# Patient Record
Sex: Female | Born: 1960
Health system: Southern US, Community
[De-identification: ages and names within clinical notes are randomized; demographics above are authoritative.]

## PROBLEM LIST (undated history)

## (undated) DIAGNOSIS — N200 Calculus of kidney: Secondary | ICD-10-CM

## (undated) DIAGNOSIS — K5190 Ulcerative colitis, unspecified, without complications: Secondary | ICD-10-CM

## (undated) DIAGNOSIS — J45909 Unspecified asthma, uncomplicated: Secondary | ICD-10-CM

## (undated) DIAGNOSIS — M8580 Other specified disorders of bone density and structure, unspecified site: Secondary | ICD-10-CM

## (undated) DIAGNOSIS — Z8719 Personal history of other diseases of the digestive system: Secondary | ICD-10-CM

## (undated) DIAGNOSIS — K754 Autoimmune hepatitis: Secondary | ICD-10-CM

## (undated) DIAGNOSIS — I951 Orthostatic hypotension: Secondary | ICD-10-CM

## (undated) DIAGNOSIS — L509 Urticaria, unspecified: Secondary | ICD-10-CM

## (undated) DIAGNOSIS — G43909 Migraine, unspecified, not intractable, without status migrainosus: Secondary | ICD-10-CM

## (undated) DIAGNOSIS — J069 Acute upper respiratory infection, unspecified: Secondary | ICD-10-CM

## (undated) DIAGNOSIS — N189 Chronic kidney disease, unspecified: Secondary | ICD-10-CM

## (undated) HISTORY — DX: Acute upper respiratory infection, unspecified: J06.9

## (undated) HISTORY — PX: LITHOTRIPSY: SUR834

## (undated) HISTORY — DX: Autoimmune hepatitis: K75.4

## (undated) HISTORY — PX: THYROIDECTOMY: SHX17

## (undated) HISTORY — DX: Orthostatic hypotension: I95.1

## (undated) HISTORY — DX: Chronic kidney disease, unspecified: N18.9

## (undated) HISTORY — DX: Urticaria, unspecified: L50.9

## (undated) HISTORY — DX: Unspecified asthma, uncomplicated: J45.909

## (undated) HISTORY — PX: CHOLECYSTECTOMY: SHX55

## (undated) HISTORY — PX: COLONOSCOPY: SHX174

## (undated) HISTORY — DX: Other specified disorders of bone density and structure, unspecified site: M85.80

## (undated) HISTORY — DX: Personal history of other diseases of the digestive system: Z87.19

## (undated) HISTORY — DX: Migraine, unspecified, not intractable, without status migrainosus: G43.909

---

## 2011-11-21 ENCOUNTER — Ambulatory Visit: Payer: BC Managed Care – PPO | Attending: Family Medicine

## 2011-11-21 DIAGNOSIS — R5381 Other malaise: Secondary | ICD-10-CM | POA: Insufficient documentation

## 2011-11-21 DIAGNOSIS — M25619 Stiffness of unspecified shoulder, not elsewhere classified: Secondary | ICD-10-CM | POA: Insufficient documentation

## 2011-11-21 DIAGNOSIS — IMO0001 Reserved for inherently not codable concepts without codable children: Secondary | ICD-10-CM | POA: Insufficient documentation

## 2011-11-21 DIAGNOSIS — M25519 Pain in unspecified shoulder: Secondary | ICD-10-CM | POA: Insufficient documentation

## 2011-11-28 ENCOUNTER — Ambulatory Visit: Payer: BC Managed Care – PPO | Admitting: Physical Therapy

## 2011-11-30 ENCOUNTER — Encounter: Payer: BC Managed Care – PPO | Admitting: Physical Therapy

## 2011-12-05 ENCOUNTER — Ambulatory Visit: Payer: BC Managed Care – PPO | Admitting: Physical Therapy

## 2011-12-06 ENCOUNTER — Ambulatory Visit: Payer: BC Managed Care – PPO | Admitting: Physical Therapy

## 2011-12-11 ENCOUNTER — Ambulatory Visit: Payer: BC Managed Care – PPO

## 2012-01-22 ENCOUNTER — Ambulatory Visit: Payer: BC Managed Care – PPO | Admitting: Internal Medicine

## 2012-03-31 ENCOUNTER — Ambulatory Visit (HOSPITAL_BASED_OUTPATIENT_CLINIC_OR_DEPARTMENT_OTHER)
Admission: RE | Admit: 2012-03-31 | Discharge: 2012-03-31 | Disposition: A | Discharge status: Home / Self Care | Payer: BC Managed Care – PPO | Source: Ambulatory Visit | Attending: Internal Medicine | Admitting: Internal Medicine

## 2012-03-31 ENCOUNTER — Ambulatory Visit (INDEPENDENT_AMBULATORY_CARE_PROVIDER_SITE_OTHER): Payer: BC Managed Care – PPO | Admitting: Internal Medicine

## 2012-03-31 ENCOUNTER — Encounter: Payer: Self-pay | Admitting: Internal Medicine

## 2012-03-31 VITALS — BP 134/80 | HR 90 | Temp 98.2°F | Resp 18 | Ht 60.0 in | Wt 148.4 lb

## 2012-03-31 DIAGNOSIS — M899 Disorder of bone, unspecified: Secondary | ICD-10-CM

## 2012-03-31 DIAGNOSIS — J45909 Unspecified asthma, uncomplicated: Secondary | ICD-10-CM

## 2012-03-31 DIAGNOSIS — K5100 Ulcerative (chronic) pancolitis without complications: Secondary | ICD-10-CM | POA: Insufficient documentation

## 2012-03-31 DIAGNOSIS — I341 Nonrheumatic mitral (valve) prolapse: Secondary | ICD-10-CM | POA: Insufficient documentation

## 2012-03-31 DIAGNOSIS — K5190 Ulcerative colitis, unspecified, without complications: Secondary | ICD-10-CM | POA: Insufficient documentation

## 2012-03-31 DIAGNOSIS — Z8719 Personal history of other diseases of the digestive system: Secondary | ICD-10-CM

## 2012-03-31 DIAGNOSIS — Z1231 Encounter for screening mammogram for malignant neoplasm of breast: Secondary | ICD-10-CM | POA: Insufficient documentation

## 2012-03-31 DIAGNOSIS — Z8669 Personal history of other diseases of the nervous system and sense organs: Secondary | ICD-10-CM

## 2012-03-31 DIAGNOSIS — I951 Orthostatic hypotension: Secondary | ICD-10-CM | POA: Insufficient documentation

## 2012-03-31 DIAGNOSIS — Z780 Asymptomatic menopausal state: Secondary | ICD-10-CM

## 2012-03-31 DIAGNOSIS — Z87442 Personal history of urinary calculi: Secondary | ICD-10-CM

## 2012-03-31 DIAGNOSIS — Z139 Encounter for screening, unspecified: Secondary | ICD-10-CM

## 2012-03-31 DIAGNOSIS — G43909 Migraine, unspecified, not intractable, without status migrainosus: Secondary | ICD-10-CM | POA: Insufficient documentation

## 2012-03-31 DIAGNOSIS — M8580 Other specified disorders of bone density and structure, unspecified site: Secondary | ICD-10-CM | POA: Insufficient documentation

## 2012-03-31 DIAGNOSIS — Z862 Personal history of diseases of the blood and blood-forming organs and certain disorders involving the immune mechanism: Secondary | ICD-10-CM

## 2012-03-31 HISTORY — DX: Ulcerative colitis, unspecified, without complications: K51.90

## 2012-03-31 HISTORY — DX: Other specified disorders of bone density and structure, unspecified site: M85.80

## 2012-03-31 LAB — CBC WITH DIFFERENTIAL/PLATELET
Basophils Absolute: 0 10*3/uL (ref 0.0–0.1)
Basophils Relative: 1 % (ref 0–1)
Eosinophils Absolute: 0.2 10*3/uL (ref 0.0–0.7)
Eosinophils Relative: 3 % (ref 0–5)
MCH: 28.8 pg (ref 26.0–34.0)
MCV: 88.8 fL (ref 78.0–100.0)
Neutrophils Relative %: 56 % (ref 43–77)
Platelets: 326 10*3/uL (ref 150–400)
RBC: 4.27 MIL/uL (ref 3.87–5.11)
RDW: 13.8 % (ref 11.5–15.5)

## 2012-03-31 NOTE — Progress Notes (Signed)
Subjective:    Patient ID: Danielle Bond, female    DOB: 1960-12-18, 51 y.o.   MRN: [id number]  HPI New pt here for first visit to establish menopausal care.  Her primary MD is NP  Ouida Sills at a [facility] with Dr. Melford Aase.  She is going to keep her primary care.  See Problem list.   Danielle Bond is concerned over what she believes are menopausal symptoms.  She has been having night sweats and multiple hot flushes during day.  She also has vaginal dryness and some burning with intercourse.  LMP was 02/2011  She is S/P BTL.  Last pap and mammogram done 12/2010  FH  Paternal cousin with breast cancer, PAunt with ovarian cancer,  PGM MI  MGM CVA  On personal or FH of DVT or PE,  No personal history of cancer or MI.  Allergies  Allergen Reactions  . Aspirin Hives  . Penicillins Hives and Swelling  . Imitrex (Sumatriptan)   . Other     imetrex  . Peanuts (Peanut Oil)   . Pepcid (Famotidine) Nausea And Vomiting  . Prednisone Nausea And Vomiting   Past Medical History  Diagnosis Date  . Asthma   . Mitral valve prolapse   . Chronic kidney disease   . Migraine   . Orthostatic hypotension    Past Surgical History  Procedure Date  . Cesarean section   . Thyroidectomy   . Cholecystectomy    History   Social History  . Marital Status: Married    Spouse Name: N/A    Number of Children: N/A  . Years of Education: N/A   Occupational History  . Not on file.   Social History Main Topics  . Smoking status: Never Smoker   . Smokeless tobacco: Not on file  . Alcohol Use: No  . Drug Use: No  . Sexually Active: Yes   Other Topics Concern  . Not on file   Social History Narrative  . No narrative on file   Family History  Problem Relation Age of Onset  . Cancer Paternal Aunt   . Heart disease Paternal Grandmother    Patient Active Problem List  Diagnosis  . Mitral valve prolapse  . Migraine  . Orthostatic hypotension  . History of anemia  . Asthma  . Osteopenia  .  History of ulcerative colitis  . History of renal calculi  . History of migraine   Current Outpatient Prescriptions on File Prior to Visit  Medication Sig Dispense Refill  . levothyroxine (SYNTHROID, LEVOTHROID) 125 MCG tablet Take 125 mcg by mouth daily.          Review of Systems See HPI    Objective:   Physical Exam  Physical Exam  Nursing note and vitals reviewed.  Constitutional: She is oriented to person, place, and time. She appears well-developed and well-nourished.  HENT:  Head: Normocephalic and atraumatic.  Cardiovascular: Normal rate and regular rhythm. Exam reveals no gallop and no friction rub.  No murmur heard.  Pulmonary/Chest: Breath sounds normal. She has no wheezes. She has no rales.  Neurological: She is alert and oriented to person, place, and time.  Skin: Skin is warm and dry.  Psychiatric: She has a normal mood and affect. Her behavior is normal.             Assessment & Plan:  Vaginal dryness will give Luvena samples to try as she does not wish hormones at this time.  Menopause:  Clinically consistant  but will check [place] with chemistries today.  She reports recent normal TSH with primary NP  Vicenta Aly at [facility]  Will schedule mm today and schedule for pap and gyn exam.

## 2012-04-01 LAB — COMPREHENSIVE METABOLIC PANEL
ALT: 30 U/L (ref 0–35)
AST: 35 U/L (ref 0–37)
Alkaline Phosphatase: 82 U/L (ref 39–117)
CO2: 29 mEq/L (ref 19–32)
Creat: 0.67 mg/dL (ref 0.50–1.10)
Sodium: 139 mEq/L (ref 135–145)
Total Bilirubin: 0.4 mg/dL (ref 0.3–1.2)
Total Protein: 8.4 g/dL — ABNORMAL HIGH (ref 6.0–8.3)

## 2012-04-01 LAB — FOLLICLE STIMULATING HORMONE: FSH: 96.3 m[IU]/mL

## 2012-04-04 ENCOUNTER — Telehealth: Payer: Self-pay | Admitting: *Deleted

## 2012-04-09 NOTE — Telephone Encounter (Signed)
Results mailed to pt

## 2012-04-10 ENCOUNTER — Encounter: Payer: Self-pay | Admitting: Internal Medicine

## 2012-04-10 ENCOUNTER — Ambulatory Visit (INDEPENDENT_AMBULATORY_CARE_PROVIDER_SITE_OTHER): Payer: BC Managed Care – PPO | Admitting: Internal Medicine

## 2012-04-10 VITALS — BP 122/80 | HR 79 | Temp 98.2°F | Resp 18 | Wt 148.0 lb

## 2012-04-10 DIAGNOSIS — Z124 Encounter for screening for malignant neoplasm of cervix: Secondary | ICD-10-CM

## 2012-04-10 DIAGNOSIS — Z780 Asymptomatic menopausal state: Secondary | ICD-10-CM

## 2012-04-10 DIAGNOSIS — Z23 Encounter for immunization: Secondary | ICD-10-CM

## 2012-04-10 DIAGNOSIS — Z129 Encounter for screening for malignant neoplasm, site unspecified: Secondary | ICD-10-CM

## 2012-04-10 MED ORDER — ESCITALOPRAM OXALATE 5 MG PO TABS: 5.0000 mg | ORAL_TABLET | Freq: Every day | ORAL | Status: DC

## 2012-04-10 NOTE — Patient Instructions (Addendum)
See me in 3-4 months

## 2012-04-10 NOTE — Progress Notes (Signed)
  Subjective:    Patient ID: Danielle Bond, female    DOB: 1960-08-02, 51 y.o.   MRN: [id number]  HPI Danielle Bond is here for GYN exam  Her mammogram is normal..Marland KitchenShe is using [place] and liking that very well  She does not wish to take  HT.  Would like to try Non hormone treatment for hot flushes.  She does not want to take black cohosh.  Allergies  Allergen Reactions  . Aspirin Hives  . Penicillins Hives and Swelling  . Imitrex (Sumatriptan)   . Other     imetrex  . Peanuts (Peanut Oil)   . Pepcid (Famotidine) Nausea And Vomiting  . Prednisone Nausea And Vomiting   Past Medical History  Diagnosis Date  . Asthma   . Mitral valve prolapse   . Chronic kidney disease   . Migraine   . Orthostatic hypotension    Past Surgical History  Procedure Date  . Cesarean section   . Thyroidectomy   . Cholecystectomy    History   Social History  . Marital Status: Married    Spouse Name: N/A    Number of Children: N/A  . Years of Education: N/A   Occupational History  . Not on file.   Social History Main Topics  . Smoking status: Never Smoker   . Smokeless tobacco: Not on file  . Alcohol Use: No  . Drug Use: No  . Sexually Active: Yes   Other Topics Concern  . Not on file   Social History Narrative  . No narrative on file   Family History  Problem Relation Age of Onset  . Cancer Paternal Aunt   . Heart disease Paternal Grandmother    Patient Active Problem List  Diagnosis  . Mitral valve prolapse  . Migraine  . Orthostatic hypotension  . History of anemia  . Asthma  . Osteopenia  . History of ulcerative colitis  . History of renal calculi  . History of migraine   Current Outpatient Prescriptions on File Prior to Visit  Medication Sig Dispense Refill  . levothyroxine (SYNTHROID, LEVOTHROID) 125 MCG tablet Take 125 mcg by mouth daily.          Review of Systems    see HPI Objective:   Physical Exam Physical Exam  Nursing note and vitals reviewed.    Constitutional: She is oriented to person, place, and time. She appears well-developed and well-nourished.  HENT:  Head: Normocephalic and atraumatic.  Cardiovascular: Normal rate and regular rhythm. Exam reveals no gallop and no friction rub.  No murmur heard.  Pulmonary/Chest: Breath sounds normal. She has no wheezes. She has no rales. Pelvic  Normal external genitalia  Vagina no lesions.  Cervix no lesions pap obtained.  No adnexal masses or tenderness rectal no mass guaiac neg  Neurological: She is alert and oriented to person, place, and time.  Skin: Skin is warm and dry.  Psychiatric: She has a normal mood and affect. Her behavior is normal.             Assessment & Plan:  Menopause  Discussed all non hormone options and pt notified these are all off label.  She would like to try Lexapro.  Will start low dose 5 mg daily.  Continue Luvena  Influenza vaccine today

## 2012-04-17 ENCOUNTER — Telehealth: Payer: Self-pay | Admitting: Internal Medicine

## 2012-04-17 NOTE — Telephone Encounter (Signed)
Jolayne Haines  If this has not been done already  Send pap letter that pap is negative and repeat in one year.  Disregard if already done

## 2012-04-18 ENCOUNTER — Telehealth: Payer: Self-pay | Admitting: *Deleted

## 2012-04-18 NOTE — Telephone Encounter (Signed)
-   pap results mailed to pt

## 2012-04-21 ENCOUNTER — Telehealth: Payer: Self-pay | Admitting: *Deleted

## 2012-04-21 NOTE — Telephone Encounter (Signed)
Pt left message regarding new RX

## 2012-04-21 NOTE — Telephone Encounter (Signed)
Returned pt call concerning side affects of Lexapro. Pt states that she has had nausea and vomiting with this medication. Advised pt not to abruptly discontinue medication and to take with food. Pt states that she has already stopped medication and is having no ill effects as a result of this. Pt will schedule an appointment to review and discuss alternative medications.

## 2012-04-25 ENCOUNTER — Encounter: Payer: Self-pay | Admitting: *Deleted

## 2012-04-28 ENCOUNTER — Encounter: Payer: Self-pay | Admitting: *Deleted

## 2012-05-12 ENCOUNTER — Telehealth: Payer: Self-pay | Admitting: Internal Medicine

## 2012-05-12 NOTE — Telephone Encounter (Signed)
Pt has questions about her medication... She states she stop taking them.. They were not helping her.. Please call her [phone]... Ad

## 2012-05-13 NOTE — Telephone Encounter (Signed)
Returned pt call regarding her medications left message

## 2012-05-14 NOTE — Telephone Encounter (Signed)
Pt has chosen to stop RX hormones and will instead use herbal medications

## 2012-07-07 ENCOUNTER — Telehealth: Payer: Self-pay | Admitting: *Deleted

## 2012-07-07 NOTE — Telephone Encounter (Signed)
Pt called requesting a copy of F

## 2012-07-07 NOTE — Telephone Encounter (Signed)
Called to confirm that pt received fax

## 2012-08-02 ENCOUNTER — Emergency Department (HOSPITAL_COMMUNITY): Payer: BC Managed Care – PPO

## 2012-08-02 ENCOUNTER — Emergency Department (HOSPITAL_COMMUNITY)
Admission: EM | Admit: 2012-08-02 | Discharge: 2012-08-02 | Disposition: A | Discharge status: Home / Self Care | Payer: BC Managed Care – PPO | Attending: Emergency Medicine | Admitting: Emergency Medicine

## 2012-08-02 ENCOUNTER — Encounter (HOSPITAL_COMMUNITY): Payer: Self-pay | Admitting: Nurse Practitioner

## 2012-08-02 DIAGNOSIS — M79609 Pain in unspecified limb: Secondary | ICD-10-CM

## 2012-08-02 DIAGNOSIS — IMO0002 Reserved for concepts with insufficient information to code with codable children: Secondary | ICD-10-CM | POA: Insufficient documentation

## 2012-08-02 DIAGNOSIS — Z79899 Other long term (current) drug therapy: Secondary | ICD-10-CM | POA: Insufficient documentation

## 2012-08-02 DIAGNOSIS — M5412 Radiculopathy, cervical region: Secondary | ICD-10-CM

## 2012-08-02 DIAGNOSIS — J45909 Unspecified asthma, uncomplicated: Secondary | ICD-10-CM | POA: Insufficient documentation

## 2012-08-02 DIAGNOSIS — I951 Orthostatic hypotension: Secondary | ICD-10-CM | POA: Insufficient documentation

## 2012-08-02 DIAGNOSIS — G43909 Migraine, unspecified, not intractable, without status migrainosus: Secondary | ICD-10-CM | POA: Insufficient documentation

## 2012-08-02 DIAGNOSIS — N189 Chronic kidney disease, unspecified: Secondary | ICD-10-CM | POA: Insufficient documentation

## 2012-08-02 DIAGNOSIS — I059 Rheumatic mitral valve disease, unspecified: Secondary | ICD-10-CM | POA: Insufficient documentation

## 2012-08-02 MED ORDER — OXYCODONE-ACETAMINOPHEN 5-325 MG PO TABS: 2.0000 | ORAL_TABLET | ORAL | Status: DC | PRN

## 2012-08-02 MED ORDER — ONDANSETRON 4 MG PO TBDP
8.0000 mg | ORAL_TABLET | Freq: Once | ORAL | Status: AC
  Administered 2012-08-02: 8 mg via ORAL
  Filled 2012-08-02 (×2): qty 2

## 2012-08-02 MED ORDER — DIAZEPAM 5 MG PO TABS: 5.0000 mg | ORAL_TABLET | Freq: Two times a day (BID) | ORAL | Status: DC

## 2012-08-02 MED ORDER — HYDROMORPHONE HCL PF 1 MG/ML IJ SOLN
1.0000 mg | Freq: Once | INTRAMUSCULAR | Status: AC
  Administered 2012-08-02: 1 mg via INTRAMUSCULAR
  Filled 2012-08-02: qty 1

## 2012-08-02 MED ORDER — OXYCODONE-ACETAMINOPHEN 5-325 MG PO TABS
2.0000 | ORAL_TABLET | Freq: Once | ORAL | Status: DC
  Filled 2012-08-02: qty 2

## 2012-08-02 MED ORDER — ONDANSETRON 4 MG PO TBDP
4.0000 mg | ORAL_TABLET | Freq: Once | ORAL | Status: AC
  Administered 2012-08-02: 4 mg via ORAL
  Filled 2012-08-02: qty 1

## 2012-08-02 NOTE — ED Notes (Signed)
PT refused pain meds but reports pain higher than 10/10 . Pt reports she wants answers as to why she hurts so much. Pt reports [facility] sent her here for answers.

## 2012-08-02 NOTE — ED Provider Notes (Signed)
History    This chart was scribed for non-physician practitioner working with Babette Relic, MD by Rhae Lerner. This patient was seen in room TR09C/TR09C and the patient's care was started at 3:19 PM.    CSN: [id number]  Arrival date & time 08/02/12  1221      Chief Complaint  Patient presents with  . Arm Pain     The history is provided by the patient. No language interpreter was used.  Danielle Bond is a 52 y.o. female who presents to the Emergency Department complaining of constant, severe upper left arm pain onset 9 days ago. Rates pain at 10/10. Pt reports that there was gradual onset. She reports hx of similar pain 1 year ago in her right arm and was diagnosed with tendonitis (had rehab for treatment). Movement of left arm upward aggravates the pain. She went to [facility] and was seen by Vicenta Aly 5 days ago. Pt reports that she had moderate nausea, neck pain and it is hard to swallow. She states that symptoms gradually worsened. She went to Urgent Care and was not given pain medication due to being sent her for further evaluation. She states that she has had left leg pain. Pt took Advil (last dose 1 day ago) without relief. pmh asthma, mvp, kidney dz, migraines and orthostatic hypotension.   Past Medical History  Diagnosis Date  . Asthma   . Mitral valve prolapse   . Chronic kidney disease   . Migraine   . Orthostatic hypotension     Past Surgical History  Procedure Date  . Cesarean section   . Thyroidectomy   . Cholecystectomy     Family History  Problem Relation Age of Onset  . Cancer Paternal Aunt   . Heart disease Paternal Grandmother     History  Substance Use Topics  . Smoking status: Never Smoker   . Smokeless tobacco: Not on file  . Alcohol Use: No    OB History    Grav Para Term Preterm Abortions TAB SAB Ect Mult Living   2    2  2   2       Review of Systems  Constitutional: Negative for fever and chills.  HENT: Positive for neck pain.     Respiratory: Negative for shortness of breath.   Cardiovascular: Negative for chest pain, palpitations and leg swelling.  Gastrointestinal: Negative for nausea and vomiting.  Musculoskeletal: Negative for joint swelling.       L shoulder pain  Neurological: Negative for weakness.  All other systems reviewed and are negative.    Allergies  Aspirin; Penicillins; Imitrex; Other; Pepcid; and Prednisone  Home Medications   Current Outpatient Rx  Name  Route  Sig  Dispense  Refill  . LEVOTHYROXINE SODIUM 125 MCG PO TABS   Oral   Take 125 mcg by mouth daily.         Marland Kitchen MESALAMINE 1.2 G PO TBEC   Oral   Take 2,400 mg by mouth daily with breakfast.         . VERAPAMIL HCL 40 MG PO TABS   Oral   Take 40 mg by mouth daily.           BP 133/90  Pulse 100  Temp 98.2 F (36.8 C) (Oral)  Resp 16  SpO2 99%  Physical Exam  Nursing note and vitals reviewed. Constitutional: She is oriented to person, place, and time. She appears well-developed and well-nourished.  HENT:  Head: Normocephalic and  atraumatic.  Neck: Normal range of motion. Neck supple.  Cardiovascular: Normal rate, regular rhythm and normal heart sounds.   Pulmonary/Chest: Effort normal and breath sounds normal. No respiratory distress.  Musculoskeletal: Normal range of motion. She exhibits tenderness. She exhibits no edema.       Nl ROM of left arm. Pain with lifting left arm. Nl sensation in left arm.  L neck tenderness  Neurological: She is alert and oriented to person, place, and time.  Skin: Skin is warm and dry.  Psychiatric: She has a normal mood and affect. Her behavior is normal.    ED Course  Procedures (including critical care time) DIAGNOSTIC STUDIES: Oxygen Saturation is 99% on room air, normal by my interpretation.    COORDINATION OF CARE: 3:25 PM Discussed ED treatment with pt  3:25 PM Ordered:   Medications  mesalamine (LIALDA) 1.2 G EC tablet (not administered)  verapamil (CALAN)  40 MG tablet (not administered)  ondansetron (ZOFRAN-ODT) disintegrating tablet 8 mg (8 mg Oral Given 08/02/12 1536)  HYDROmorphone (DILAUDID) injection 1 mg (1 mg Intramuscular Given 08/02/12 1536)           Labs Reviewed - No data to display Dg Cervical Spine Complete  08/02/2012  *RADIOLOGY REPORT*  Clinical Data: Arm pain  CERVICAL SPINE - COMPLETE 4+ VIEW  Comparison: None  Findings: Normal alignment of the cervical spine.  The vertebral body heights and disc spaces are well preserved.  Facet joints are aligned.  No fracture or subluxation identified.  Surgical clips within the thyroid bed noted consistent with prior thyroidectomy.  IMPRESSION:  1.  Normal exam.   Original Report Authenticated By: Kerby Moors, M.D.      No diagnosis found.    MDM  Radicular pain to L neck LUE sent here from UC for eval.  U/S LUE with no blood clot. Cervical spine films unremarkable reviewed by myself.   Rx for pain meds and follow up with ortho.       I personally performed the services described in this documentation, which was scribed in my presence. The recorded information has been reviewed and is accurate.    Julieta Bellini, NP 08/04/12 1258

## 2012-08-02 NOTE — ED Notes (Addendum)
C/o L arm pain onset last Thursday, denies any injuries, but pain has been increasingly worse since onset. Went to [facility] for eval today and he sent for further eval of symptoms. Reports the [facility] told her he could feel swelling in her lymph nodes in her throat and armpit. L arm cms intact, but painful to move.

## 2012-08-02 NOTE — Progress Notes (Signed)
Orthopedic Tech Progress Note Patient Details:  Danielle Bond 1961/04/21 [id number]  Ortho Devices Type of Ortho Device: Arm sling Ortho Device/Splint Location: left UE Ortho Device/Splint Interventions: Application   Danielle Bond 08/02/2012, 6:28 PM

## 2012-08-02 NOTE — Progress Notes (Signed)
VASCULAR LAB PRELIMINARY  PRELIMINARY  PRELIMINARY  PRELIMINARY  Left upper extremity venous Doppler completed.    Preliminary report:  There is no obvious DVT or SVT noted in the left upper extremity.  Lanny Lipkin, RVT 08/02/2012, 4:45 PM

## 2012-08-08 NOTE — ED Provider Notes (Signed)
Medical screening examination/treatment/procedure(s) were performed by non-physician practitioner and as supervising physician I was immediately available for consultation/collaboration.   Babette Relic, MD 08/08/12 2159

## 2012-08-15 ENCOUNTER — Emergency Department (HOSPITAL_COMMUNITY): Payer: BC Managed Care – PPO

## 2012-08-15 ENCOUNTER — Encounter (HOSPITAL_COMMUNITY): Payer: Self-pay | Admitting: *Deleted

## 2012-08-15 ENCOUNTER — Inpatient Hospital Stay (HOSPITAL_COMMUNITY): Payer: BC Managed Care – PPO

## 2012-08-15 ENCOUNTER — Other Ambulatory Visit: Payer: Self-pay | Admitting: Gastroenterology

## 2012-08-15 ENCOUNTER — Observation Stay (HOSPITAL_COMMUNITY)
Admission: EM | Admit: 2012-08-15 | Discharge: 2012-08-16 | DRG: 205 | Disposition: A | Discharge status: Home / Self Care | Payer: BC Managed Care – PPO | Attending: Internal Medicine | Admitting: Internal Medicine

## 2012-08-15 DIAGNOSIS — E890 Postprocedural hypothyroidism: Secondary | ICD-10-CM | POA: Diagnosis present

## 2012-08-15 DIAGNOSIS — M19019 Primary osteoarthritis, unspecified shoulder: Secondary | ICD-10-CM | POA: Diagnosis present

## 2012-08-15 DIAGNOSIS — K5100 Ulcerative (chronic) pancolitis without complications: Secondary | ICD-10-CM | POA: Diagnosis present

## 2012-08-15 DIAGNOSIS — G43909 Migraine, unspecified, not intractable, without status migrainosus: Secondary | ICD-10-CM | POA: Diagnosis present

## 2012-08-15 DIAGNOSIS — J45909 Unspecified asthma, uncomplicated: Secondary | ICD-10-CM | POA: Diagnosis present

## 2012-08-15 DIAGNOSIS — Z881 Allergy status to other antibiotic agents status: Secondary | ICD-10-CM

## 2012-08-15 DIAGNOSIS — K754 Autoimmune hepatitis: Principal | ICD-10-CM | POA: Diagnosis present

## 2012-08-15 DIAGNOSIS — Z862 Personal history of diseases of the blood and blood-forming organs and certain disorders involving the immune mechanism: Secondary | ICD-10-CM

## 2012-08-15 DIAGNOSIS — Z79899 Other long term (current) drug therapy: Secondary | ICD-10-CM

## 2012-08-15 DIAGNOSIS — N189 Chronic kidney disease, unspecified: Secondary | ICD-10-CM | POA: Diagnosis present

## 2012-08-15 DIAGNOSIS — D649 Anemia, unspecified: Secondary | ICD-10-CM | POA: Diagnosis present

## 2012-08-15 DIAGNOSIS — Z888 Allergy status to other drugs, medicaments and biological substances status: Secondary | ICD-10-CM

## 2012-08-15 DIAGNOSIS — Z9089 Acquired absence of other organs: Secondary | ICD-10-CM

## 2012-08-15 DIAGNOSIS — K759 Inflammatory liver disease, unspecified: Secondary | ICD-10-CM

## 2012-08-15 DIAGNOSIS — K5190 Ulcerative colitis, unspecified, without complications: Secondary | ICD-10-CM | POA: Diagnosis present

## 2012-08-15 DIAGNOSIS — R7401 Elevation of levels of liver transaminase levels: Secondary | ICD-10-CM | POA: Diagnosis present

## 2012-08-15 DIAGNOSIS — E876 Hypokalemia: Secondary | ICD-10-CM | POA: Diagnosis present

## 2012-08-15 DIAGNOSIS — I059 Rheumatic mitral valve disease, unspecified: Secondary | ICD-10-CM | POA: Diagnosis present

## 2012-08-15 DIAGNOSIS — I341 Nonrheumatic mitral (valve) prolapse: Secondary | ICD-10-CM | POA: Diagnosis present

## 2012-08-15 DIAGNOSIS — Z886 Allergy status to analgesic agent status: Secondary | ICD-10-CM

## 2012-08-15 DIAGNOSIS — Z87442 Personal history of urinary calculi: Secondary | ICD-10-CM

## 2012-08-15 DIAGNOSIS — M25519 Pain in unspecified shoulder: Secondary | ICD-10-CM | POA: Diagnosis present

## 2012-08-15 DIAGNOSIS — K5900 Constipation, unspecified: Secondary | ICD-10-CM | POA: Diagnosis present

## 2012-08-15 HISTORY — DX: Ulcerative colitis, unspecified, without complications: K51.90

## 2012-08-15 LAB — COMPREHENSIVE METABOLIC PANEL
ALT: 812 U/L — ABNORMAL HIGH (ref 0–35)
AST: 476 U/L — ABNORMAL HIGH (ref 0–37)
Albumin: 3.4 g/dL — ABNORMAL LOW (ref 3.5–5.2)
Alkaline Phosphatase: 205 U/L — ABNORMAL HIGH (ref 39–117)
BUN: 19 mg/dL (ref 6–23)
CO2: 27 mEq/L (ref 19–32)
Calcium: 8.9 mg/dL (ref 8.4–10.5)
Chloride: 100 mEq/L (ref 96–112)
Creatinine, Ser: 0.65 mg/dL (ref 0.50–1.10)
GFR calc Af Amer: >90 mL/min (ref >90)
GFR calc non Af Amer: >90 mL/min (ref >90)
Glucose, Bld: 123 mg/dL — ABNORMAL HIGH (ref 70–99)
Potassium: 3 mEq/L — ABNORMAL LOW (ref 3.5–5.1)
Sodium: 137 mEq/L (ref 135–145)
Total Bilirubin: 0.9 mg/dL (ref 0.3–1.2)
Total Protein: 9.3 g/dL — ABNORMAL HIGH (ref 6.0–8.3)

## 2012-08-15 LAB — CBC WITH DIFFERENTIAL/PLATELET
Basophils Absolute: 0 10*3/uL (ref 0.0–0.1)
Basophils Relative: 0 % (ref 0–1)
Eosinophils Absolute: 0 10*3/uL (ref 0.0–0.7)
Eosinophils Relative: 0 % (ref 0–5)
HCT: 40.2 % (ref 36.0–46.0)
Hemoglobin: 13.7 g/dL (ref 12.0–15.0)
Lymphocytes Relative: 23 % (ref 12–46)
Lymphs Abs: 1.7 10*3/uL (ref 0.7–4.0)
MCH: 29.8 pg (ref 26.0–34.0)
MCHC: 34.1 g/dL (ref 30.0–36.0)
MCV: 87.6 fL (ref 78.0–100.0)
Monocytes Absolute: 0.2 10*3/uL (ref 0.1–1.0)
Monocytes Relative: 3 % (ref 3–12)
Neutro Abs: 5.5 10*3/uL (ref 1.7–7.7)
Neutrophils Relative %: 74 % (ref 43–77)
Platelets: 316 10*3/uL (ref 150–400)
RBC: 4.59 MIL/uL (ref 3.87–5.11)
RDW: 14.3 % (ref 11.5–15.5)
WBC: 7.5 10*3/uL (ref 4.0–10.5)

## 2012-08-15 LAB — URINALYSIS, ROUTINE W REFLEX MICROSCOPIC
Glucose, UA: NEGATIVE mg/dL
Hgb urine dipstick: NEGATIVE
Ketones, ur: 15 mg/dL — AB
Nitrite: NEGATIVE
Protein, ur: NEGATIVE mg/dL
Specific Gravity, Urine: 1.033 — ABNORMAL HIGH (ref 1.005–1.030)
Urobilinogen, UA: 1 mg/dL (ref 0.0–1.0)
pH: 5 (ref 5.0–8.0)

## 2012-08-15 LAB — HEPATIC FUNCTION PANEL
AST: 463 U/L — ABNORMAL HIGH (ref 0–37)
Bilirubin, Direct: 1 mg/dL — ABNORMAL HIGH (ref 0.0–0.3)
Indirect Bilirubin: 0.5 mg/dL (ref 0.3–0.9)
Total Bilirubin: 1.5 mg/dL — ABNORMAL HIGH (ref 0.3–1.2)

## 2012-08-15 LAB — URINE MICROSCOPIC-ADD ON

## 2012-08-15 LAB — CBC
HCT: 36.2 % (ref 36.0–46.0)
Hemoglobin: 12.1 g/dL (ref 12.0–15.0)
MCH: 29.4 pg (ref 26.0–34.0)
MCHC: 33.4 g/dL (ref 30.0–36.0)
MCV: 88.1 fL (ref 78.0–100.0)
Platelets: 308 K/uL (ref 150–400)
RBC: 4.11 MIL/uL (ref 3.87–5.11)
RDW: 14.3 % (ref 11.5–15.5)
WBC: 7.3 K/uL (ref 4.0–10.5)

## 2012-08-15 LAB — AMMONIA: Ammonia: 15 umol/L (ref 11–60)

## 2012-08-15 LAB — ACETAMINOPHEN LEVEL: Acetaminophen (Tylenol), Serum: <15 ug/mL (ref 10–30)

## 2012-08-15 LAB — LIPASE, BLOOD: Lipase: 66 U/L — ABNORMAL HIGH (ref 11–59)

## 2012-08-15 MED ORDER — HYDROMORPHONE HCL PF 1 MG/ML IJ SOLN
0.5000 mg | INTRAMUSCULAR | Status: DC | PRN
  Administered 2012-08-15: 0.5 mg via INTRAVENOUS
  Filled 2012-08-15: qty 1

## 2012-08-15 MED ORDER — ONDANSETRON HCL 4 MG/2ML IJ SOLN: 4.0000 mg | Freq: Once | INTRAMUSCULAR | Status: DC

## 2012-08-15 MED ORDER — SODIUM CHLORIDE 0.9 % IV BOLUS (SEPSIS)
1000.0000 mL | Freq: Once | INTRAVENOUS | Status: AC
  Administered 2012-08-15: 1000 mL via INTRAVENOUS

## 2012-08-15 MED ORDER — ONDANSETRON HCL 4 MG/2ML IJ SOLN
4.0000 mg | Freq: Once | INTRAMUSCULAR | Status: AC
  Administered 2012-08-15: 4 mg via INTRAVENOUS
  Filled 2012-08-15: qty 2

## 2012-08-15 MED ORDER — PANTOPRAZOLE SODIUM 40 MG PO TBEC
40.0000 mg | DELAYED_RELEASE_TABLET | Freq: Every day | ORAL | Status: DC
  Administered 2012-08-15 – 2012-08-16 (×2): 40 mg via ORAL
  Filled 2012-08-15 (×2): qty 1

## 2012-08-15 MED ORDER — HYDROMORPHONE HCL PF 1 MG/ML IJ SOLN
1.0000 mg | Freq: Once | INTRAMUSCULAR | Status: AC
  Administered 2012-08-15: 1 mg via INTRAVENOUS
  Filled 2012-08-15: qty 1

## 2012-08-15 MED ORDER — ONDANSETRON HCL 4 MG/2ML IJ SOLN
INTRAMUSCULAR | Status: AC
  Filled 2012-08-15: qty 2

## 2012-08-15 MED ORDER — PREDNISONE 20 MG PO TABS: 60.0000 mg | ORAL_TABLET | Freq: Every day | ORAL | Status: DC

## 2012-08-15 MED ORDER — HYDROMORPHONE HCL PF 1 MG/ML IJ SOLN: 1.0000 mg | Freq: Once | INTRAMUSCULAR | Status: DC

## 2012-08-15 MED ORDER — MESALAMINE 1.2 G PO TBEC
2400.0000 mg | DELAYED_RELEASE_TABLET | Freq: Every day | ORAL | Status: DC
  Administered 2012-08-15: 2.4 g via ORAL
  Filled 2012-08-15 (×2): qty 2

## 2012-08-15 MED ORDER — ONDANSETRON HCL 4 MG PO TABS: 4.0000 mg | ORAL_TABLET | Freq: Four times a day (QID) | ORAL | Status: DC | PRN

## 2012-08-15 MED ORDER — OXYCODONE HCL 5 MG PO TABS: 5.0000 mg | ORAL_TABLET | ORAL | Status: DC | PRN

## 2012-08-15 MED ORDER — PROMETHAZINE HCL 25 MG/ML IJ SOLN
12.5000 mg | Freq: Once | INTRAMUSCULAR | Status: AC
  Administered 2012-08-15: 12.5 mg via INTRAVENOUS
  Filled 2012-08-15: qty 1

## 2012-08-15 MED ORDER — LEVALBUTEROL HCL 0.63 MG/3ML IN NEBU
0.6300 mg | INHALATION_SOLUTION | Freq: Four times a day (QID) | RESPIRATORY_TRACT | Status: DC | PRN
  Filled 2012-08-15: qty 3

## 2012-08-15 MED ORDER — ONDANSETRON HCL 4 MG/2ML IJ SOLN
4.0000 mg | Freq: Once | INTRAMUSCULAR | Status: AC
  Administered 2012-08-15: 4 mg via INTRAVENOUS

## 2012-08-15 MED ORDER — PREDNISONE 50 MG PO TABS
60.0000 mg | ORAL_TABLET | Freq: Every day | ORAL | Status: DC
  Administered 2012-08-15 – 2012-08-16 (×2): 60 mg via ORAL
  Filled 2012-08-15 (×3): qty 1

## 2012-08-15 MED ORDER — ENOXAPARIN SODIUM 40 MG/0.4ML ~~LOC~~ SOLN
40.0000 mg | SUBCUTANEOUS | Status: DC
  Administered 2012-08-15: 40 mg via SUBCUTANEOUS
  Filled 2012-08-15 (×2): qty 0.4

## 2012-08-15 MED ORDER — IOHEXOL 300 MG/ML  SOLN
100.0000 mL | Freq: Once | INTRAMUSCULAR | Status: AC | PRN
  Administered 2012-08-15: 100 mL via INTRAVENOUS

## 2012-08-15 MED ORDER — POTASSIUM CHLORIDE IN NACL 40-0.9 MEQ/L-% IV SOLN
INTRAVENOUS | Status: DC
  Administered 2012-08-15 – 2012-08-16 (×2): via INTRAVENOUS
  Filled 2012-08-15 (×3): qty 1000

## 2012-08-15 MED ORDER — ONDANSETRON HCL 4 MG/2ML IJ SOLN
4.0000 mg | Freq: Four times a day (QID) | INTRAMUSCULAR | Status: DC | PRN
  Administered 2012-08-15 – 2012-08-16 (×2): 4 mg via INTRAVENOUS
  Filled 2012-08-15 (×2): qty 2

## 2012-08-15 MED ORDER — IOHEXOL 300 MG/ML  SOLN
50.0000 mL | Freq: Once | INTRAMUSCULAR | Status: AC | PRN
  Administered 2012-08-15: 50 mL via ORAL

## 2012-08-15 MED ORDER — LEVOTHYROXINE SODIUM 125 MCG PO TABS
125.0000 ug | ORAL_TABLET | Freq: Every day | ORAL | Status: DC
  Administered 2012-08-16: 125 ug via ORAL
  Filled 2012-08-15 (×3): qty 1

## 2012-08-15 MED ORDER — VERAPAMIL HCL 40 MG PO TABS
40.0000 mg | ORAL_TABLET | Freq: Every evening | ORAL | Status: DC
Start: 2012-08-15 — End: 2012-08-16
  Filled 2012-08-15 (×3): qty 1

## 2012-08-15 NOTE — Consult Note (Signed)
Reason for Consult: Elevated liver enzymes. Referring Physician: [facility]  [name] HPI: This is a 52 year old female with complaints of abdominal bloating, pain, and left arm arthritis.  She started to have these issues two weeks ago.  Initially it was in the form of left arm pain and swelling.  Her left 5th digit was also swollen.  She presented to Urgent Care and she was thought to have a cervical radiculopathy, but further evaluation with Dr. Sharol Given, Orthopedics, did not confirm this diagnosis.  She was sent over to Dr. Estanislado Pandy for her complaints, which were consistent with fibromyalgia.  Dr. Estanislado Pandy evaluated the patient this past Tuesday and she her lower extremities were consistent with fibromyalgia, but her upper extremity pain was possibly autoimmune.  Blood work drawn at that time ultimately revealed an ALT of 1200 and an AST of 800.  Her AP was also elevated at 250, but no increase in her bilirubin.  Her ANA was also increased at 1:640.  Prior to the blood work results she was placed on a steroid taper and she felt much better until she tapered down to a certain level.  As a result of her persistent symptoms she presented to the ER.  She has a history of UC diagnosed 3 years ago and at that time she reports having similar enzyme elevations, but no physician, even her gastroenterologist, per her report, did not mention anything about autoimmune hepatitis.  With treatment for her UC her liver enzymes ultimately normalized.  She moved down to [place] last year and she established caer with Dr. Collene Mares.  Her UC symptoms have been under control with the use of Lialda.  A CT scan performed today revealed a mild periportal edema, but no other abnormalities.    Past Medical History  Diagnosis Date  . Asthma   . Mitral valve prolapse   . Chronic kidney disease   . Migraine   . Orthostatic hypotension     Past Surgical History  Procedure Date  . Cesarean section   . Thyroidectomy    . Cholecystectomy     Family History  Problem Relation Age of Onset  . Cancer Paternal Aunt   . Heart disease Paternal Grandmother     Social History:  reports that she has never smoked. She does not have any smokeless tobacco history on file. She reports that she does not drink alcohol or use illicit drugs.  Allergies:  Allergies  Allergen Reactions  . Other Anaphylaxis    Almonds, tree nuts  . Aspirin Hives and Nausea Only  . Penicillins Hives and Swelling  . Imitrex (Sumatriptan) Swelling    Injection caused swelling  . Pepcid (Famotidine) Nausea And Vomiting  . Prednisone Nausea And Vomiting    All steroids causes nausea and pancreatitis     Medications:  Scheduled:   . pantoprazole  40 mg Oral Daily  . predniSONE  60 mg Oral Q breakfast   Continuous:   Results for orders placed during the hospital encounter of 08/15/12 (from the past 24 hour(s))  CBC WITH DIFFERENTIAL     Status: Normal   Collection Time   08/15/12 12:43 PM      Component Value Range   WBC 7.5  4.0 - 10.5 K/uL   RBC 4.59  3.87 - 5.11 MIL/uL   Hemoglobin 13.7  12.0 - 15.0 g/dL   HCT 40.2  36.0 - 46.0 %   MCV 87.6  78.0 - 100.0 fL   MCH  29.8  26.0 - 34.0 pg   MCHC 34.1  30.0 - 36.0 g/dL   RDW 14.3  11.5 - 15.5 %   Platelets 316  150 - 400 K/uL   Neutrophils Relative 74  43 - 77 %   Neutro Abs 5.5  1.7 - 7.7 K/uL   Lymphocytes Relative 23  12 - 46 %   Lymphs Abs 1.7  0.7 - 4.0 K/uL   Monocytes Relative 3  3 - 12 %   Monocytes Absolute 0.2  0.1 - 1.0 K/uL   Eosinophils Relative 0  0 - 5 %   Eosinophils Absolute 0.0  0.0 - 0.7 K/uL   Basophils Relative 0  0 - 1 %   Basophils Absolute 0.0  0.0 - 0.1 K/uL  COMPREHENSIVE METABOLIC PANEL     Status: Abnormal   Collection Time   08/15/12 12:43 PM      Component Value Range   Sodium 137  135 - 145 mEq/L   Potassium 3.0 (*) 3.5 - 5.1 mEq/L   Chloride 100  96 - 112 mEq/L   CO2 27  19 - 32 mEq/L   Glucose, Bld 123 (*) 70 - 99 mg/dL   BUN 19  6  - 23 mg/dL   Creatinine, Ser 0.65  0.50 - 1.10 mg/dL   Calcium 8.9  8.4 - 10.5 mg/dL   Total Protein 9.3 (*) 6.0 - 8.3 g/dL   Albumin 3.4 (*) 3.5 - 5.2 g/dL   AST 476 (*) 0 - 37 U/L   ALT 812 (*) 0 - 35 U/L   Alkaline Phosphatase 205 (*) 39 - 117 U/L   Total Bilirubin 0.9  0.3 - 1.2 mg/dL   GFR calc non Af Amer >90  >90 mL/min   GFR calc Af Amer >90  >90 mL/min  LIPASE, BLOOD     Status: Abnormal   Collection Time   08/15/12 12:43 PM      Component Value Range   Lipase 66 (*) 11 - 59 U/L  TROPONIN I     Status: Normal   Collection Time   08/15/12 12:55 PM      Component Value Range   Troponin I <0.30  <0.30 ng/mL  URINALYSIS, ROUTINE W REFLEX MICROSCOPIC     Status: Abnormal   Collection Time   08/15/12  2:10 PM      Component Value Range   Color, Urine ORANGE (*) YELLOW   APPearance CLOUDY (*) CLEAR   Specific Gravity, Urine 1.033 (*) 1.005 - 1.030   pH 5.0  5.0 - 8.0   Glucose, UA NEGATIVE  NEGATIVE mg/dL   Hgb urine dipstick NEGATIVE  NEGATIVE   Bilirubin Urine MODERATE (*) NEGATIVE   Ketones, ur 15 (*) NEGATIVE mg/dL   Protein, ur NEGATIVE  NEGATIVE mg/dL   Urobilinogen, UA 1.0  0.0 - 1.0 mg/dL   Nitrite NEGATIVE  NEGATIVE   Leukocytes, UA TRACE (*) NEGATIVE  URINE MICROSCOPIC-ADD ON     Status: Abnormal   Collection Time   08/15/12  2:10 PM      Component Value Range   Squamous Epithelial / LPF RARE  RARE   WBC, UA 0-2  <3 WBC/hpf   RBC / HPF 0-2  <3 RBC/hpf   Crystals CA OXALATE CRYSTALS (*) NEGATIVE   Urine-Other MUCOUS PRESENT    ACETAMINOPHEN LEVEL     Status: Normal   Collection Time   08/15/12  4:30 PM      Component Value  Range   Acetaminophen (Tylenol), Serum <15.0  10 - 30 ug/mL  HEPATIC FUNCTION PANEL     Status: Abnormal   Collection Time   08/15/12  4:30 PM      Component Value Range   Total Protein 8.2  6.0 - 8.3 g/dL   Albumin 3.2 (*) 3.5 - 5.2 g/dL   AST 463 (*) 0 - 37 U/L   ALT 760 (*) 0 - 35 U/L   Alkaline Phosphatase 207 (*) 39 - 117 U/L    Total Bilirubin 1.5 (*) 0.3 - 1.2 mg/dL   Bilirubin, Direct 1.0 (*) 0.0 - 0.3 mg/dL   Indirect Bilirubin 0.5  0.3 - 0.9 mg/dL  AMMONIA     Status: Normal   Collection Time   08/15/12  4:31 PM      Component Value Range   Ammonia 15  11 - 60 umol/L     Ct Abdomen Pelvis W Contrast  08/15/2012  *RADIOLOGY REPORT*  Clinical Data: Abnormal LFTs.  Abdominal pain, nausea.  CT ABDOMEN AND PELVIS WITH CONTRAST  Technique:  Multidetector CT imaging of the abdomen and pelvis was performed following the standard protocol during bolus administration of intravenous contrast.  Contrast: 167m OMNIPAQUE IOHEXOL 300 MG/ML  SOLN  Comparison: None.  Findings: Lung bases are clear.  No effusions.  Heart is normal size.  Prior cholecystectomy.  Slight intrahepatic biliary ductal dilatation.  There is also likely mild periportal edema.  This is nonspecific but can be seen with volume resuscitation or inflammatory processes such as hepatitis.  No focal hepatic abnormality.  Stomach, pancreas, spleen, adrenals, kidneys are unremarkable. Appendix is visualized and is normal.  Moderate stool throughout the colon.  Small bowel is decompressed.  No free fluid, free air or adenopathy.  Urinary bladder is decompressed.  No acute bony abnormality.  IMPRESSION: Mild periportal edema within the liver, nonspecific but may be related to volume resuscitation or a nonspecific inflammatory process such as hepatitis.  Moderate stool burden throughout the colon.  Normal appendix.   Original Report Authenticated By: KRolm Baptise M.D.     ROS:  As stated above in the HPI otherwise negative.  Blood pressure 106/74, pulse 80, temperature 98.3 F (36.8 C), temperature source Oral, resp. rate 21, SpO2 100.00%.    PE: Gen: NAD, Alert and Oriented HEENT:  [place]/AT, EOMI Neck: Supple, no LAD Lungs: CTA Bilaterally CV: RRR without M/G/R ABM: Soft, tender in the mid abdomen, +BS Ext: No C/C/E  Assessment/Plan: 1) Autoimmune Hepatitis. 2)  UC. 3) Hypothyroidism. 4) Arthritis/arthralgias.   I think her current presentation with her prior history is consistent with an autoimmune hepatitis.  I cannot be completely certain, but her transaminases have dropped with steroid treatment, but her symptoms have flared now that she is on a lower dosing of steroids.  She was to be on steroids for one week.  There is no medication/toxin history or exposure and her prior work up for an acute hepatitis panel was negative.  No clinical evidence of shock liver.  Plan: 1) Start on prednisone 60 mg. 2) Follow liver enzymes. 3) Check IgG, AMA/ASMA, and P-ANCA.  Haruo Stepanek D 08/15/2012, 5:52 PM

## 2012-08-15 NOTE — ED Notes (Signed)
Pt reports getting a call from her PCP that her enzymes were 'off'.  States that her pcp is concerned for pancreatitis.  Pt reports abdominal swelling and pain.  Reports nausea, denies vomiting.

## 2012-08-15 NOTE — ED Notes (Signed)
MD Campos at bedside.

## 2012-08-15 NOTE — H&P (Addendum)
[facility] History and Physical  Amrutha Avera VWU:[id number] DOB: May 12, 1961 DOA: 08/15/2012  Referring physician: Brent General, PA-C   PCP: Vicenta Aly, FNP   Chief Complaint:Abnormal Lab    HPI:  52 year old female who presents to the ER with abdominal pain. The patient has elevated AST and ALT. She has a history of ulcerative colitis. The patient is also complaining of left shoulder pain with inability to lift her right arm, is complaining of stiffness and pain in her right shoulder. She states that she was seen by Dr.Devashwar and recently started on oral prednisone that improved her pain for about 2 days and since she has been tapering her oral prednisone the pain has returned. She has not had any hematochezia melena nausea vomiting or diarrhea. She feels bloated and occasionally constipated. She denies any recent weight loss. Apparently her ALT was in the 1200 range and couple of weeks ago and is now trending down. Dr. Saralyn Pilar hung has already evaluated the patient the ED. CT abdomen shows mild  periportal edema within the liver       Review of Systems: negative for the following  Constitutional: Denies fever, chills, diaphoresis, appetite change and fatigue.  HEENT: Denies photophobia, eye pain, redness, hearing loss, ear pain, congestion, sore throat, rhinorrhea, sneezing, mouth sores, trouble swallowing, neck pain, neck stiffness and tinnitus.  Respiratory: Denies SOB, DOE, cough, chest tightness, and wheezing.  Cardiovascular: Denies chest pain, palpitations and leg swelling.  Gastrointestinal: Denies nausea, vomiting, abdominal pain, diarrhea, constipation, blood in stool and abdominal distention.  Genitourinary: Denies dysuria, urgency, frequency, hematuria, flank pain and difficulty urinating.  Musculoskeletal: Denies myalgias, back pain, joint swelling, arthralgias and gait problem.  Skin: Denies pallor, rash and wound.  Neurological: Denies dizziness,  seizures, syncope, weakness, light-headedness, numbness and headaches.  Hematological: Denies adenopathy. Easy bruising, personal or family bleeding history  Psychiatric/Behavioral: Denies suicidal ideation, mood changes, confusion, nervousness, sleep disturbance and agitation       Past Medical History  Diagnosis Date  . Asthma   . Mitral valve prolapse   . Chronic kidney disease   . Migraine   . Orthostatic hypotension      Past Surgical History  Procedure Date  . Cesarean section   . Thyroidectomy   . Cholecystectomy       Social History:  reports that she has never smoked. She does not have any smokeless tobacco history on file. She reports that she does not drink alcohol or use illicit drugs.    Allergies  Allergen Reactions  . Other Anaphylaxis    Almonds, tree nuts  . Aspirin Hives and Nausea Only  . Penicillins Hives and Swelling  . Imitrex (Sumatriptan) Swelling    Injection caused swelling  . Pepcid (Famotidine) Nausea And Vomiting  . Prednisone Nausea And Vomiting    All steroids causes nausea and pancreatitis     Family History  Problem Relation Age of Onset  . Cancer Paternal Aunt   . Heart disease Paternal Grandmother      Prior to Admission medications   Medication Sig Start Date End Date Taking? Authorizing Provider  FOLIC ACID PO Take 1 tablet by mouth daily. OTC Folic Acid   Yes Historical Provider, MD  levothyroxine (SYNTHROID, LEVOTHROID) 125 MCG tablet Take 125 mcg by mouth every morning.    Yes Historical Provider, MD  mesalamine (LIALDA) 1.2 G EC tablet Take 2,400 mg by mouth at bedtime.    Yes Historical Provider, MD  Multiple Vitamin (MULTIVITAMIN WITH  MINERALS) TABS Take 1 tablet by mouth daily.   Yes Historical Provider, MD  verapamil (CALAN) 40 MG tablet Take 40 mg by mouth every evening.    Yes Historical Provider, MD     Physical Exam: Filed Vitals:   08/15/12 1221 08/15/12 1430  BP: 129/82 106/74  Pulse: 108 80  Temp:  98.3 F (36.8 C)   TempSrc: Oral   Resp: 16 21  SpO2: 100% 100%     Constitutional: Vital signs reviewed. Patient is a well-developed and well-nourished in no acute distress and cooperative with exam. Alert and oriented x3.  Head: Normocephalic and atraumatic  Ear: TM normal bilaterally  Mouth: no erythema or exudates, MMM  Eyes: PERRL, EOMI, conjunctivae normal, No scleral icterus.  Neck: Supple, Trachea midline normal ROM, No JVD, mass, thyromegaly, or carotid bruit present.  Cardiovascular: RRR, S1 normal, S2 normal, no MRG, pulses symmetric and intact bilaterally  Pulmonary/Chest: CTAB, no wheezes, rales, or rhonchi  Abdominal: Soft. Non-tender, non-distended, bowel sounds are normal, no masses, organomegaly, or guarding present.  GU: no CVA tenderness Musculoskeletal: No joint deformities, erythema, or stiffness, ROM full and no nontender Ext: no edema and no cyanosis, pulses palpable bilaterally (DP and PT)  Hematology: no cervical, inginal, or axillary adenopathy.  Neurological: A&O x3, Strenght is normal and symmetric bilaterally, cranial nerve II-XII are grossly intact, no focal motor deficit, sensory intact to light touch bilaterally.  Skin: Warm, dry and intact. No rash, cyanosis, or clubbing.  Psychiatric: Normal mood and affect. speech and behavior is normal. Judgment and thought content normal. Cognition and memory are normal.       Labs on Admission:    Basic Metabolic Panel:  Lab 34/19/37 1243  NA 137  K 3.0*  CL 100  CO2 27  GLUCOSE 123*  BUN 19  CREATININE 0.65  CALCIUM 8.9  MG --  PHOS --   Liver Function Tests:  Lab 08/15/12 1243  AST 476*  ALT 812*  ALKPHOS 205*  BILITOT 0.9  PROT 9.3*  ALBUMIN 3.4*    Lab 08/15/12 1243  LIPASE 66*  AMYLASE --   No results found for this basename: AMMONIA:5 in the last 168 hours CBC:  Lab 08/15/12 1243  WBC 7.5  NEUTROABS 5.5  HGB 13.7  HCT 40.2  MCV 87.6  PLT 316   Cardiac Enzymes:  Lab  08/15/12 1255  CKTOTAL --  CKMB --  CKMBINDEX --  TROPONINI <0.30    BNP (last 3 results) No results found for this basename: PROBNP:3 in the last 8760 hours    CBG: No results found for this basename: GLUCAP:5 in the last 168 hours  Radiological Exams on Admission: Ct Abdomen Pelvis W Contrast  08/15/2012  *RADIOLOGY REPORT*  Clinical Data: Abnormal LFTs.  Abdominal pain, nausea.  CT ABDOMEN AND PELVIS WITH CONTRAST  Technique:  Multidetector CT imaging of the abdomen and pelvis was performed following the standard protocol during bolus administration of intravenous contrast.  Contrast: 143m OMNIPAQUE IOHEXOL 300 MG/ML  SOLN  Comparison: None.  Findings: Lung bases are clear.  No effusions.  Heart is normal size.  Prior cholecystectomy.  Slight intrahepatic biliary ductal dilatation.  There is also likely mild periportal edema.  This is nonspecific but can be seen with volume resuscitation or inflammatory processes such as hepatitis.  No focal hepatic abnormality.  Stomach, pancreas, spleen, adrenals, kidneys are unremarkable. Appendix is visualized and is normal.  Moderate stool throughout the colon.  Small bowel is decompressed.  No free fluid, free air or adenopathy.  Urinary bladder is decompressed.  No acute bony abnormality.  IMPRESSION: Mild periportal edema within the liver, nonspecific but may be related to volume resuscitation or a nonspecific inflammatory process such as hepatitis.  Moderate stool burden throughout the colon.  Normal appendix.   Original Report Authenticated By: Rolm Baptise, M.D.     EKG: Independently reviewed.  Assessment/Plan Principal Problem:  *Transaminitis Active Problems:  Mitral valve prolapse  History of anemia  Asthma  History of ulcerative colitis  History of renal calculi  History of migraine   Transaminitis most consistent with auto immune hepatitis, the patient has not been started on any new medications except MESALAMINE and recently  started on verapamil for migraines. We'll check her for autoimmune hepatitis antibody panel. Dr. Saralyn Pilar hung has evaluated the patient and anticipate starting her on prednisone 60 mg a day. He anticipate that the patient is doing well then she may be discharged tomorrow if LFT's continue to improve .   Ulcerated colitis continue with mesalamine, no active flare symptoms at this point  Hypokalemia replete  Left shoulder pain prescribed prednisone taper by Dr.Devashwar if no improvement may need an MRI of the right shoulder  .   Code Status:   full Family Communication: bedside Disposition Plan: admit   Time spent: 70 mins   [facility] Pager [phone]  If 7PM-7AM, please contact night-coverage [email or web address] Password [facility] 08/15/2012, 4:40 PM

## 2012-08-15 NOTE — ED Notes (Signed)
Pt reports increase in pain. Pt tearful, anxious. MD [facility] made aware. VSS. Family at bedside.

## 2012-08-15 NOTE — Progress Notes (Signed)
Oriented patient to room and staffs upon admitting to the floor.  Patient request for supper tray, explained to patient that she is currently on NPO.  Attempted to call the on-call MD to clarify the reason for NPO, unable d/t shift change in the [facility], patient is aware.  Patient denies paint at the moment.

## 2012-08-15 NOTE — ED Provider Notes (Signed)
History     CSN: [id number]  Arrival date & time 08/15/12  1215   First MD Initiated Contact with Patient 08/15/12 1228      Chief Complaint  Patient presents with  . Abnormal Lab    (Consider location/radiation/quality/duration/timing/severity/associated sxs/prior treatment) HPI Patient presents to the emergency department from her rheumatologist, who states that she has abnormal enzymes.  Patient states, that she's not sure what was abnormal other than that her lipase was elevated.  Patient denies chest pain, short of breath, headache, fever, weakness, vomiting, diarrhea, dizziness, or syncope.  Patient, states that she's had pancreatitis in the past.  Patient, states she did not take anything for her pain.  Patient states nothing seems to make her pain better or worse. Past Medical History  Diagnosis Date  . Asthma   . Mitral valve prolapse   . Chronic kidney disease   . Migraine   . Orthostatic hypotension     Past Surgical History  Procedure Date  . Cesarean section   . Thyroidectomy   . Cholecystectomy     Family History  Problem Relation Age of Onset  . Cancer Paternal Aunt   . Heart disease Paternal Grandmother     History  Substance Use Topics  . Smoking status: Never Smoker   . Smokeless tobacco: Not on file  . Alcohol Use: No    OB History    Grav Para Term Preterm Abortions TAB SAB Ect Mult Living   2    2  2   2       Review of Systems All other systems negative except as documented in the HPI. All pertinent positives and negatives as reviewed in the HPI. Allergies  Aspirin; Penicillins; Imitrex; Other; Pepcid; and Prednisone  Home Medications   Current Outpatient Rx  Name  Route  Sig  Dispense  Refill  . DIAZEPAM 5 MG PO TABS   Oral   Take 1 tablet (5 mg total) by mouth 2 (two) times daily.   10 tablet   0   . LEVOTHYROXINE SODIUM 125 MCG PO TABS   Oral   Take 125 mcg by mouth daily.         Marland Kitchen MESALAMINE 1.2 G PO TBEC   Oral  Take 2,400 mg by mouth daily with breakfast.         . OXYCODONE-ACETAMINOPHEN 5-325 MG PO TABS   Oral   Take 2 tablets by mouth every 4 (four) hours as needed for pain.   6 tablet   0   . VERAPAMIL HCL 40 MG PO TABS   Oral   Take 40 mg by mouth daily.           BP 129/82  Pulse 108  Temp 98.3 F (36.8 C) (Oral)  Resp 16  SpO2 100%  Physical Exam  Nursing note and vitals reviewed. Constitutional: She is oriented to person, place, and time. She appears well-developed and well-nourished. No distress.  HENT:  Head: Normocephalic and atraumatic.  Mouth/Throat: Oropharynx is clear and moist.  Eyes: Pupils are equal, round, and reactive to light.  Neck: Normal range of motion. Neck supple.  Cardiovascular: Normal rate, regular rhythm and normal heart sounds.  Exam reveals no gallop and no friction rub.   No murmur heard. Pulmonary/Chest: Effort normal and breath sounds normal. No respiratory distress.  Abdominal: Soft. She exhibits no distension. There is tenderness. There is no rebound and no guarding.  Neurological: She is alert and oriented to person,  place, and time.  Skin: Skin is warm and dry. No rash noted. No erythema.    ED Course  Procedures (including critical care time)  Patient will be admitted to the hospital for further evaluation and care of her elevated liver enzymes and possible acute hepatitis.  I spoke with the, [facility], along with her gastroenterologist, Dr. Benson Norway she's been stable here in the emergency department and given a plan of care.  All of her questions were answered.    MDM  MDM Reviewed: vitals and nursing note Interpretation: labs, ECG and CT scan Consults: admitting MD and gastrointestinal    Date: 08/15/2012  Rate:92  Rhythm: normal sinus rhythm  QRS Axis: normal  Intervals: normal  ST/T Wave abnormalities: normal  Conduction Disutrbances:first-degree A-V block   Narrative Interpretation:   Old EKG Reviewed: none  available           Brent General, PA-C 08/15/12 [place], PA-C 08/15/12 1958

## 2012-08-15 NOTE — Progress Notes (Signed)
Patient admit to [place] at [place] from the ED.  Patient alert and oriented, spouse at bedside.

## 2012-08-15 NOTE — ED Provider Notes (Signed)
Medical screening examination/treatment/procedure(s) were conducted as a shared visit with non-physician practitioner(s) and myself.  I personally evaluated the patient during the encounter  The patient appears to have acute hepatitis.  Etiology is unclear at this time.  The patient be admitted to the hospital for additional workup.  Gastroenterology will evaluate the patient at the bedside.  I personally reviewed the imaging tests through PACS system I reviewed available ER/hospitalization records through the EMR  X-ray Chest Pa And Lateral   08/15/2012  *RADIOLOGY REPORT*  Clinical Data: Rule out pneumonia  CHEST - 2 VIEW  Comparison: None  Findings: Cardiomediastinal silhouette is unremarkable.  No acute infiltrate or pleural effusion.  No pulmonary edema.  Mild degenerative changes mid thoracic spine.  IMPRESSION: No acute infiltrate or pulmonary edema.  Mild degenerative changes mid thoracic spine.   Original Report Authenticated By: Lahoma Crocker, M.D.    Dg Cervical Spine Complete  08/02/2012  *RADIOLOGY REPORT*  Clinical Data: Arm pain  CERVICAL SPINE - COMPLETE 4+ VIEW  Comparison: None  Findings: Normal alignment of the cervical spine.  The vertebral body heights and disc spaces are well preserved.  Facet joints are aligned.  No fracture or subluxation identified.  Surgical clips within the thyroid bed noted consistent with prior thyroidectomy.  IMPRESSION:  1.  Normal exam.   Original Report Authenticated By: Kerby Moors, M.D.    Ct Abdomen Pelvis W Contrast  08/15/2012  *RADIOLOGY REPORT*  Clinical Data: Abnormal LFTs.  Abdominal pain, nausea.  CT ABDOMEN AND PELVIS WITH CONTRAST  Technique:  Multidetector CT imaging of the abdomen and pelvis was performed following the standard protocol during bolus administration of intravenous contrast.  Contrast: 162m OMNIPAQUE IOHEXOL 300 MG/ML  SOLN  Comparison: None.  Findings: Lung bases are clear.  No effusions.  Heart is normal size.  Prior  cholecystectomy.  Slight intrahepatic biliary ductal dilatation.  There is also likely mild periportal edema.  This is nonspecific but can be seen with volume resuscitation or inflammatory processes such as hepatitis.  No focal hepatic abnormality.  Stomach, pancreas, spleen, adrenals, kidneys are unremarkable. Appendix is visualized and is normal.  Moderate stool throughout the colon.  Small bowel is decompressed.  No free fluid, free air or adenopathy.  Urinary bladder is decompressed.  No acute bony abnormality.  IMPRESSION: Mild periportal edema within the liver, nonspecific but may be related to volume resuscitation or a nonspecific inflammatory process such as hepatitis.  Moderate stool burden throughout the colon.  Normal appendix.   Original Report Authenticated By: KRolm Baptise M.D.   Results for orders placed during the hospital encounter of 08/15/12  CBC WITH DIFFERENTIAL      Component Value Range   WBC 7.5  4.0 - 10.5 K/uL   RBC 4.59  3.87 - 5.11 MIL/uL   Hemoglobin 13.7  12.0 - 15.0 g/dL   HCT 40.2  36.0 - 46.0 %   MCV 87.6  78.0 - 100.0 fL   MCH 29.8  26.0 - 34.0 pg   MCHC 34.1  30.0 - 36.0 g/dL   RDW 14.3  11.5 - 15.5 %   Platelets 316  150 - 400 K/uL   Neutrophils Relative 74  43 - 77 %   Neutro Abs 5.5  1.7 - 7.7 K/uL   Lymphocytes Relative 23  12 - 46 %   Lymphs Abs 1.7  0.7 - 4.0 K/uL   Monocytes Relative 3  3 - 12 %   Monocytes Absolute 0.2  0.1 - 1.0 K/uL   Eosinophils Relative 0  0 - 5 %   Eosinophils Absolute 0.0  0.0 - 0.7 K/uL   Basophils Relative 0  0 - 1 %   Basophils Absolute 0.0  0.0 - 0.1 K/uL  COMPREHENSIVE METABOLIC PANEL      Component Value Range   Sodium 137  135 - 145 mEq/L   Potassium 3.0 (*) 3.5 - 5.1 mEq/L   Chloride 100  96 - 112 mEq/L   CO2 27  19 - 32 mEq/L   Glucose, Bld 123 (*) 70 - 99 mg/dL   BUN 19  6 - 23 mg/dL   Creatinine, Ser 0.65  0.50 - 1.10 mg/dL   Calcium 8.9  8.4 - 10.5 mg/dL   Total Protein 9.3 (*) 6.0 - 8.3 g/dL   Albumin 3.4  (*) 3.5 - 5.2 g/dL   AST 476 (*) 0 - 37 U/L   ALT 812 (*) 0 - 35 U/L   Alkaline Phosphatase 205 (*) 39 - 117 U/L   Total Bilirubin 0.9  0.3 - 1.2 mg/dL   GFR calc non Af Amer >90  >90 mL/min   GFR calc Af Amer >90  >90 mL/min  URINALYSIS, ROUTINE W REFLEX MICROSCOPIC      Component Value Range   Color, Urine ORANGE (*) YELLOW   APPearance CLOUDY (*) CLEAR   Specific Gravity, Urine 1.033 (*) 1.005 - 1.030   pH 5.0  5.0 - 8.0   Glucose, UA NEGATIVE  NEGATIVE mg/dL   Hgb urine dipstick NEGATIVE  NEGATIVE   Bilirubin Urine MODERATE (*) NEGATIVE   Ketones, ur 15 (*) NEGATIVE mg/dL   Protein, ur NEGATIVE  NEGATIVE mg/dL   Urobilinogen, UA 1.0  0.0 - 1.0 mg/dL   Nitrite NEGATIVE  NEGATIVE   Leukocytes, UA TRACE (*) NEGATIVE  LIPASE, BLOOD      Component Value Range   Lipase 66 (*) 11 - 59 U/L  TROPONIN I      Component Value Range   Troponin I <0.30  <0.30 ng/mL  URINE MICROSCOPIC-ADD ON      Component Value Range   Squamous Epithelial / LPF RARE  RARE   WBC, UA 0-2  <3 WBC/hpf   RBC / HPF 0-2  <3 RBC/hpf   Crystals CA OXALATE CRYSTALS (*) NEGATIVE   Urine-Other MUCOUS PRESENT    ACETAMINOPHEN LEVEL      Component Value Range   Acetaminophen (Tylenol), Serum <15.0  10 - 30 ug/mL  HEPATIC FUNCTION PANEL      Component Value Range   Total Protein 8.2  6.0 - 8.3 g/dL   Albumin 3.2 (*) 3.5 - 5.2 g/dL   AST 463 (*) 0 - 37 U/L   ALT 760 (*) 0 - 35 U/L   Alkaline Phosphatase 207 (*) 39 - 117 U/L   Total Bilirubin 1.5 (*) 0.3 - 1.2 mg/dL   Bilirubin, Direct 1.0 (*) 0.0 - 0.3 mg/dL   Indirect Bilirubin 0.5  0.3 - 0.9 mg/dL  AMMONIA      Component Value Range   Ammonia 15  11 - 60 umol/L  MAGNESIUM      Component Value Range   Magnesium 1.6  1.5 - 2.5 mg/dL  CBC      Component Value Range   WBC 7.3  4.0 - 10.5 K/uL   RBC 4.11  3.87 - 5.11 MIL/uL   Hemoglobin 12.1  12.0 - 15.0 g/dL   HCT 36.2  36.0 -  46.0 %   MCV 88.1  78.0 - 100.0 fL   MCH 29.4  26.0 - 34.0 pg   MCHC 33.4   30.0 - 36.0 g/dL   RDW 14.3  11.5 - 15.5 %   Platelets 308  150 - 400 K/uL     Hoy Morn, MD 08/15/12 2024

## 2012-08-15 NOTE — ED Notes (Addendum)
Dr. Allyson Sabal made aware that a Cardiologist was needed to complete the ordered 2D Echo after hours and on Saturday, no new orders given, Dr. Allyson Sabal is coming to CDU to see pt and feels at this the test can be completed on Monday

## 2012-08-15 NOTE — ED Notes (Signed)
PO contrast given to patient

## 2012-08-16 ENCOUNTER — Encounter (HOSPITAL_COMMUNITY): Payer: Self-pay | Admitting: Internal Medicine

## 2012-08-16 DIAGNOSIS — K5190 Ulcerative colitis, unspecified, without complications: Secondary | ICD-10-CM

## 2012-08-16 DIAGNOSIS — K759 Inflammatory liver disease, unspecified: Secondary | ICD-10-CM

## 2012-08-16 DIAGNOSIS — E876 Hypokalemia: Secondary | ICD-10-CM

## 2012-08-16 DIAGNOSIS — K754 Autoimmune hepatitis: Principal | ICD-10-CM | POA: Diagnosis present

## 2012-08-16 LAB — CBC
HCT: 35.1 % — ABNORMAL LOW (ref 36.0–46.0)
MCH: 28.5 pg (ref 26.0–34.0)
MCHC: 32.5 g/dL (ref 30.0–36.0)
MCV: 87.8 fL (ref 78.0–100.0)
Platelets: 303 10*3/uL (ref 150–400)
RDW: 14.6 % (ref 11.5–15.5)
WBC: 4.4 10*3/uL (ref 4.0–10.5)

## 2012-08-16 LAB — COMPREHENSIVE METABOLIC PANEL
ALT: 670 U/L — ABNORMAL HIGH (ref 0–35)
AST: 376 U/L — ABNORMAL HIGH (ref 0–37)
Albumin: 3 g/dL — ABNORMAL LOW (ref 3.5–5.2)
Alkaline Phosphatase: 196 U/L — ABNORMAL HIGH (ref 39–117)
BUN: 8 mg/dL (ref 6–23)
Chloride: 104 mEq/L (ref 96–112)
Potassium: 4.7 mEq/L (ref 3.5–5.1)
Sodium: 138 mEq/L (ref 135–145)
Total Bilirubin: 1 mg/dL (ref 0.3–1.2)
Total Protein: 8.1 g/dL (ref 6.0–8.3)

## 2012-08-16 LAB — PROTIME-INR: INR: 1.2 (ref 0.00–1.49)

## 2012-08-16 MED ORDER — PANTOPRAZOLE SODIUM 40 MG PO TBEC: 40.0000 mg | DELAYED_RELEASE_TABLET | Freq: Every day | ORAL | Status: DC

## 2012-08-16 MED ORDER — PREDNISONE 20 MG PO TABS: 60.0000 mg | ORAL_TABLET | Freq: Every day | ORAL | Status: DC

## 2012-08-16 NOTE — Progress Notes (Signed)
[facility]  Patient Name: Danielle Bond Date of Encounter: 08/16/2012, 12:06 PM    Subjective  Feeling better, less arm and abdominal pain   Objective    Physical Exam: Filed Vitals:   08/16/12 0550  BP: 94/52  Pulse: 71  Temp: 98.1 F (36.7 C)  Resp: 16   General: Well developed, well nourished, in no acute distress. Abdomen: Soft, mildy tender, non-distended with normoactive bowel sounds. No hepatomegaly. No rebound/guarding. No obvious abdominal masses. Psych:  Responds to questions appropriately with a normal affect.    Labs:  [facility] 08/16/12 0510 08/15/12 1630 08/15/12 1243  NA 138 -- 137  K 4.7 -- 3.0*  CL 104 -- 100  CO2 28 -- 27  GLUCOSE 149* -- 123*  BUN 8 -- 19  CREATININE 0.62 -- 0.65  CALCIUM 8.7 -- 8.9  MG -- 1.6 --  PHOS -- -- --    Basename 08/16/12 0510 08/15/12 1630  AST 376* 463*  ALT 670* 760*  ALKPHOS 196* 207*  BILITOT 1.0 1.5*  PROT 8.1 8.2  ALBUMIN 3.0* 3.2*    Basename 08/15/12 1243  LIPASE 66*  AMYLASE --    Basename 08/16/12 0510 08/15/12 1914 08/15/12 1243  WBC 4.4 7.3 --  NEUTROABS -- -- 5.5  HGB 11.4* 12.1 --  HCT 35.1* 36.2 --  MCV 87.8 88.1 --  PLT 303 308 --    Basename 08/15/12 1914  TSH 2.243  T4TOTAL --  T3FREE --  THYROIDAB --     Radiology/Studies:  Ct Abdomen Pelvis W Contrast  08/15/2012  *RADIOLOGY REPORT*  Clinical Data: Abnormal LFTs.  Abdominal pain, nausea.  CT ABDOMEN AND PELVIS WITH CONTRAST  Technique:  Multidetector CT imaging of the abdomen and pelvis was performed following the standard protocol during bolus administration of intravenous contrast.  Contrast: 127m OMNIPAQUE IOHEXOL 300 MG/ML  SOLN  Comparison: None.  Findings: Lung bases are clear.  No effusions.  Heart is normal size.  Prior cholecystectomy.  Slight intrahepatic biliary ductal dilatation.  There is also likely mild periportal edema.  This is nonspecific but can be seen with volume resuscitation or inflammatory  processes such as hepatitis.  No focal hepatic abnormality.  Stomach, pancreas, spleen, adrenals, kidneys are unremarkable. Appendix is visualized and is normal.  Moderate stool throughout the colon.  Small bowel is decompressed.  No free fluid, free air or adenopathy.  Urinary bladder is decompressed.  No acute bony abnormality.  IMPRESSION: Mild periportal edema within the liver, nonspecific but may be related to volume resuscitation or a nonspecific inflammatory process such as hepatitis.  Moderate stool burden throughout the colon.  Normal appendix.   Original Report Authenticated By: KRolm Baptise M.D.      Assessment and Plan  1) Autoimmune hepatitis - suspected 2) Ulcerative colitis  She is better clinically and so are LFT's I think she could also have a component of autoimmune cholangiopathy /PSC.  OK to dc. Would stay on 60 mg prednisone and have her call Dr. MLorie Apleyoffice on Monday to arrange f/u Will likely need a liver bx and possibly an MRCP   Discussed w/ Dr. HAzzie Almas MD, [facility] [phone](pager) 08/16/2012 12:09 PM

## 2012-08-16 NOTE — Discharge Summary (Signed)
Physician Discharge Summary  Danielle Bond IOE:[id number] DOB: 1961/05/26 DOA: 08/15/2012  PCP: Vicenta Aly, FNP  Admit date: 08/15/2012 Discharge date: 08/16/2012  Time spent: Leas than 30 minutes  Recommendations for Outpatient Follow-up:  1. With Dr. Juanita Craver, GI. Patient to call on 1/27 for follow up appointment. 2. With Dr. Vicenta Aly, PCP.  Discharge Diagnoses:  Principal Problem:  *Transaminitis Active Problems:  Mitral valve prolapse  History of anemia  Asthma  Ulcerative colitis  History of renal calculi  History of migraine   Discharge Condition: Improved and stable  Diet recommendation: Regular diet.   History of present illness:  This is a 52 year old female with complaints of abdominal bloating, pain, and left arm arthritis. She started to have these issues two weeks ago. Initially it was in the form of left arm pain and swelling. Her left 5th digit was also swollen. She presented to Urgent Care and she was thought to have a cervical radiculopathy, but further evaluation with Dr. Sharol Given, Orthopedics, did not confirm this diagnosis. She was sent over to Dr. Estanislado Pandy for her complaints, which were consistent with fibromyalgia. Dr. Estanislado Pandy evaluated the patient this past Tuesday and she her lower extremities were consistent with fibromyalgia, but her upper extremity pain was possibly autoimmune. Blood work drawn at that time ultimately revealed an ALT of 1200 and an AST of 800. Her AP was also elevated at 250, but no increase in her bilirubin. Her ANA was also increased at 1:640. Prior to the blood work results she was placed on a steroid taper and she felt much better until she tapered down to a certain level. As a result of her persistent symptoms she presented to the ER. She has a history of UC diagnosed 3 years ago and at that time she reports having similar enzyme elevations, but no physician, even her gastroenterologist, per her report, did not mention  anything about autoimmune hepatitis. With treatment for her UC her liver enzymes ultimately normalized. She moved down to [place] last year and she established caer with Dr. Collene Mares. Her UC symptoms have been under control with the use of Lialda. A CT scan performed today revealed a mild periportal edema, but no other abnormalities.   Hospital Course:  1. Autoimmune Hepatitis: admitted to hospital. GI consulted. Dr. Benson Norway opined that her presentation is consistent with autoimmune Hepatitis. She had done better as OP while she was on higher dose of steroids and became symptomatic when her steroid doses decreased with worsened transaminitis. There is no medication/toxin history or exposure and her prior work up for an acute hepatitis panel was negative. No clinical evidence of shock liver. Since admission, she is clinically better and so are her LFT's. Dr. Carlean Purl has seen today and thinks that she could have a component of autoimmune cholangiopathy/PSC. He has cleared her for discharge home on Prednisone 60 mg daily, and F/u with primary GI. She may need liver biopsy and possibly an MRCP. 2. Ulcerative Colitis: constipated. Passing flatus. Uses Miralax at home. Continue Lialda. 3. Hypokalemia: repleted 4. Anemia: OP follow up. 5. Migraines: continue Verapamil. No headaches. 6. Left shoulder pain: improved. OP follow up with Rheumatology.  Procedures:  None  Consultations:  Gasterenterology: Dr. Marita Snellen.  Discharge Exam: Filed Vitals:   08/15/12 2144 08/16/12 0145 08/16/12 0550 08/16/12 1030  BP: 90/51 98/55 94/52  117/72  Pulse: 81 73 71 80  Temp: 98.9 F (37.2 C) 98.5 F (36.9 C) 98.1 F (36.7 C) 98.2 F (36.8 C)  TempSrc:  Oral  Resp: 18 18 16 18   SpO2: 96% 98% 98% 98%     General exam: comfortable. Ambulating halls.  Respiratory System: clear to auscultation. No increased work of breathing.  CVS: S1 & S2 heard, RRR. No JVD, murmur or pedal edema.  Abdomen: ND, NT, soft  and normal bowel sounds heard.  CNS: alert and oriented. No focal deficits.  Extremities: symmetric 5/5 power.  Discharge Instructions      Discharge Orders    Future Orders Please Complete By Expires   Diet - low sodium heart healthy      Increase activity slowly      Call MD for:  persistant nausea and vomiting      Call MD for:  severe uncontrolled pain          Medication List     As of 08/16/2012  3:46 PM    TAKE these medications         FOLIC ACID PO   Take 1 tablet by mouth daily. OTC Folic Acid      levothyroxine 125 MCG tablet   Commonly known as: SYNTHROID, LEVOTHROID   Take 125 mcg by mouth every morning.      mesalamine 1.2 G EC tablet   Commonly known as: LIALDA   Take 2,400 mg by mouth at bedtime.      multivitamin with minerals Tabs   Take 1 tablet by mouth daily.      pantoprazole 40 MG tablet   Commonly known as: PROTONIX   Take 1 tablet (40 mg total) by mouth daily.      predniSONE 20 MG tablet   Commonly known as: DELTASONE   Take 3 tablets (60 mg total) by mouth daily with breakfast.      verapamil 40 MG tablet   Commonly known as: CALAN   Take 40 mg by mouth every evening.         Follow-up Information    Schedule an appointment as soon as possible for a visit with Juanita Craver, MD. (Call office on 1/27 for followup appointment)    Contact information:   [address], [place] [address] [phone]       Schedule an appointment as soon as possible for a visit with Vicenta Aly, [place].   Contact information:   [place] [address] [phone]           The results of significant diagnostics from this hospitalization (including imaging, microbiology, ancillary and laboratory) are listed below for reference.    Significant Diagnostic Studies: X-ray Chest Pa And Lateral   08/15/2012  *RADIOLOGY REPORT*  Clinical Data: Rule out pneumonia  CHEST - 2 VIEW  Comparison: None  Findings:  Cardiomediastinal silhouette is unremarkable.  No acute infiltrate or pleural effusion.  No pulmonary edema.  Mild degenerative changes mid thoracic spine.  IMPRESSION: No acute infiltrate or pulmonary edema.  Mild degenerative changes mid thoracic spine.   Original Report Authenticated By: Lahoma Crocker, M.D.    Dg Cervical Spine Complete  08/02/2012  *RADIOLOGY REPORT*  Clinical Data: Arm pain  CERVICAL SPINE - COMPLETE 4+ VIEW  Comparison: None  Findings: Normal alignment of the cervical spine.  The vertebral body heights and disc spaces are well preserved.  Facet joints are aligned.  No fracture or subluxation identified.  Surgical clips within the thyroid bed noted consistent with prior thyroidectomy.  IMPRESSION:  1.  Normal exam.   Original Report Authenticated By: Kerby Moors,  M.D.    Ct Abdomen Pelvis W Contrast  08/15/2012  *RADIOLOGY REPORT*  Clinical Data: Abnormal LFTs.  Abdominal pain, nausea.  CT ABDOMEN AND PELVIS WITH CONTRAST  Technique:  Multidetector CT imaging of the abdomen and pelvis was performed following the standard protocol during bolus administration of intravenous contrast.  Contrast: 124m OMNIPAQUE IOHEXOL 300 MG/ML  SOLN  Comparison: None.  Findings: Lung bases are clear.  No effusions.  Heart is normal size.  Prior cholecystectomy.  Slight intrahepatic biliary ductal dilatation.  There is also likely mild periportal edema.  This is nonspecific but can be seen with volume resuscitation or inflammatory processes such as hepatitis.  No focal hepatic abnormality.  Stomach, pancreas, spleen, adrenals, kidneys are unremarkable. Appendix is visualized and is normal.  Moderate stool throughout the colon.  Small bowel is decompressed.  No free fluid, free air or adenopathy.  Urinary bladder is decompressed.  No acute bony abnormality.  IMPRESSION: Mild periportal edema within the liver, nonspecific but may be related to volume resuscitation or a nonspecific inflammatory process such as  hepatitis.  Moderate stool burden throughout the colon.  Normal appendix.   Original Report Authenticated By: KRolm Baptise M.D.     Microbiology: No results found for this or any previous visit (from the past 240 hour(s)).   Labs: Basic Metabolic Panel:  Lab [id number] 08/15/12 1630 08/15/12 1243  NA 138 -- 137  K 4.7 -- 3.0*  CL 104 -- 100  CO2 28 -- 27  GLUCOSE 149* -- 123*  BUN 8 -- 19  CREATININE 0.62 -- 0.65  CALCIUM 8.7 -- 8.9  MG -- 1.6 --  PHOS -- -- --   Liver Function Tests:  Lab 08/16/12 0510 08/15/12 1630 08/15/12 1243  AST 376* 463* 476*  ALT 670* 760* 812*  ALKPHOS 196* 207* 205*  BILITOT 1.0 1.5* 0.9  PROT 8.1 8.2 9.3*  ALBUMIN 3.0* 3.2* 3.4*    Lab 08/15/12 1243  LIPASE 66*  AMYLASE --    Lab 08/15/12 1631  AMMONIA 15   CBC:  Lab 08/16/12 0510 08/15/12 1914 08/15/12 1243  WBC 4.4 7.3 7.5  NEUTROABS -- -- 5.5  HGB 11.4* 12.1 13.7  HCT 35.1* 36.2 40.2  MCV 87.8 88.1 87.6  PLT 303 308 316   Cardiac Enzymes:  Lab 08/15/12 1255  CKTOTAL --  CKMB --  CKMBINDEX --  TROPONINI <0.30   BNP: BNP (last 3 results) No results found for this basename: PROBNP:3 in the last 8760 hours CBG: No results found for this basename: GLUCAP:5 in the last 168 hours  IgG: 2900 Ammonia: 15 INR: 1.2 Acetaminophen level < 15 TSH 2.243 Echo: Study Conclusions  - Left ventricle: The cavity size was normal. Systolic function was normal. The estimated ejection fraction was in the range of 60% to 65%. Left ventricular diastolic function parameters were normal. - Mitral valve: Structurally normal valve (no MVP).      Signed:Vernell Leep [facility] 08/16/2012, 3:46 PM

## 2012-08-16 NOTE — Progress Notes (Signed)
  Echocardiogram 2D Echocardiogram has been performed.  Danielle Bond 08/16/2012, 11:07 AM

## 2012-08-18 ENCOUNTER — Other Ambulatory Visit: Payer: Self-pay | Admitting: Gastroenterology

## 2012-08-18 DIAGNOSIS — R7989 Other specified abnormal findings of blood chemistry: Secondary | ICD-10-CM

## 2012-08-18 DIAGNOSIS — K5190 Ulcerative colitis, unspecified, without complications: Secondary | ICD-10-CM

## 2012-08-18 LAB — S CEREVISIAE ABS
S cerevisiae IgA ab: 18.3 Units (ref <=20.0)
S cerevisiae IgG ab: 12.7 Units (ref <=20.0)

## 2012-08-18 LAB — HEPATITIS PANEL, ACUTE
HCV Ab: NEGATIVE
Hep A IgM: NEGATIVE
Hep B C IgM: NEGATIVE
Hepatitis B Surface Ag: NEGATIVE

## 2012-08-20 LAB — ANTI-MICROSOMAL ANTIBODY LIVER / KIDNEY: Liver-Kidney Microsomal Ab: <=20 U (ref <=20.0)

## 2012-08-20 LAB — ANTI-SMOOTH MUSCLE ANTIBODY, IGG: F-Actin IgG: 86 U — ABNORMAL HIGH (ref <20)

## 2012-08-20 LAB — MITOCHONDRIAL ANTIBODIES: Mitochondrial M2 Ab, IgG: 1.14 — ABNORMAL HIGH (ref <0.91)

## 2012-08-24 ENCOUNTER — Ambulatory Visit
Admission: RE | Admit: 2012-08-24 | Discharge: 2012-08-24 | Disposition: A | Discharge status: Home / Self Care | Payer: BC Managed Care – PPO | Source: Ambulatory Visit | Attending: Gastroenterology | Admitting: Gastroenterology

## 2012-08-24 DIAGNOSIS — K5190 Ulcerative colitis, unspecified, without complications: Secondary | ICD-10-CM

## 2012-08-24 DIAGNOSIS — R7989 Other specified abnormal findings of blood chemistry: Secondary | ICD-10-CM

## 2012-08-24 MED ORDER — GADOBENATE DIMEGLUMINE 529 MG/ML IV SOLN
15.0000 mL | Freq: Once | INTRAVENOUS | Status: AC | PRN
  Administered 2012-08-24: 15 mL via INTRAVENOUS

## 2012-09-21 ENCOUNTER — Encounter (HOSPITAL_COMMUNITY): Payer: Self-pay | Admitting: *Deleted

## 2012-09-21 ENCOUNTER — Emergency Department (HOSPITAL_COMMUNITY)
Admission: EM | Admit: 2012-09-21 | Discharge: 2012-09-21 | Disposition: A | Discharge status: Home / Self Care | Payer: BC Managed Care – PPO | Attending: Emergency Medicine | Admitting: Emergency Medicine

## 2012-09-21 DIAGNOSIS — N189 Chronic kidney disease, unspecified: Secondary | ICD-10-CM | POA: Insufficient documentation

## 2012-09-21 DIAGNOSIS — R7402 Elevation of levels of lactic acid dehydrogenase (LDH): Secondary | ICD-10-CM | POA: Insufficient documentation

## 2012-09-21 DIAGNOSIS — K5100 Ulcerative (chronic) pancolitis without complications: Secondary | ICD-10-CM

## 2012-09-21 DIAGNOSIS — H9209 Otalgia, unspecified ear: Secondary | ICD-10-CM | POA: Insufficient documentation

## 2012-09-21 DIAGNOSIS — K625 Hemorrhage of anus and rectum: Secondary | ICD-10-CM

## 2012-09-21 DIAGNOSIS — Z87442 Personal history of urinary calculi: Secondary | ICD-10-CM | POA: Insufficient documentation

## 2012-09-21 DIAGNOSIS — J45909 Unspecified asthma, uncomplicated: Secondary | ICD-10-CM | POA: Insufficient documentation

## 2012-09-21 DIAGNOSIS — R82998 Other abnormal findings in urine: Secondary | ICD-10-CM

## 2012-09-21 DIAGNOSIS — R7989 Other specified abnormal findings of blood chemistry: Secondary | ICD-10-CM

## 2012-09-21 DIAGNOSIS — R7401 Elevation of levels of liver transaminase levels: Secondary | ICD-10-CM

## 2012-09-21 DIAGNOSIS — R319 Hematuria, unspecified: Secondary | ICD-10-CM | POA: Insufficient documentation

## 2012-09-21 DIAGNOSIS — N390 Urinary tract infection, site not specified: Secondary | ICD-10-CM

## 2012-09-21 HISTORY — DX: Calculus of kidney: N20.0

## 2012-09-21 LAB — COMPREHENSIVE METABOLIC PANEL
ALT: 101 U/L — ABNORMAL HIGH (ref 0–35)
Albumin: 3.7 g/dL (ref 3.5–5.2)
BUN: 16 mg/dL (ref 6–23)
Calcium: 9.5 mg/dL (ref 8.4–10.5)
GFR calc Af Amer: >90 mL/min (ref >90)
Glucose, Bld: 95 mg/dL (ref 70–99)
Sodium: 138 mEq/L (ref 135–145)
Total Protein: 8.2 g/dL (ref 6.0–8.3)

## 2012-09-21 LAB — URINALYSIS, MICROSCOPIC ONLY
Nitrite: POSITIVE — AB
Specific Gravity, Urine: 1.035 — ABNORMAL HIGH (ref 1.005–1.030)
Urobilinogen, UA: 1 mg/dL (ref 0.0–1.0)
pH: 5 (ref 5.0–8.0)

## 2012-09-21 LAB — CBC WITH DIFFERENTIAL/PLATELET
Basophils Relative: 0 % (ref 0–1)
Eosinophils Absolute: 0 10*3/uL (ref 0.0–0.7)
Eosinophils Relative: 0 % (ref 0–5)
Lymphs Abs: 4.2 10*3/uL — ABNORMAL HIGH (ref 0.7–4.0)
MCH: 29.9 pg (ref 26.0–34.0)
MCHC: 33.8 g/dL (ref 30.0–36.0)
MCV: 88.5 fL (ref 78.0–100.0)
Neutrophils Relative %: 61 % (ref 43–77)
Platelets: 347 10*3/uL (ref 150–400)
RBC: 4.78 MIL/uL (ref 3.87–5.11)

## 2012-09-21 LAB — LIPASE, BLOOD: Lipase: 75 U/L — ABNORMAL HIGH (ref 11–59)

## 2012-09-21 MED ORDER — NEOMYCIN-POLYMYXIN-HC 3.5-10000-1 OT SOLN: 3.0000 [drp] | Freq: Four times a day (QID) | OTIC | Status: DC

## 2012-09-21 MED ORDER — CIPROFLOXACIN HCL 500 MG PO TABS: 500.0000 mg | ORAL_TABLET | Freq: Two times a day (BID) | ORAL | Status: DC

## 2012-09-21 NOTE — ED Provider Notes (Signed)
History     CSN: [id number]  Arrival date & time 09/21/12  1221   None     Chief Complaint  Patient presents with  . Abdominal Pain  . Rectal Bleeding    (Consider location/radiation/quality/duration/timing/severity/associated sxs/prior treatment) HPI Ms. Disch 52 yo pmh of UC and AI hepatitis during recent 08/15/12 admission presented from home with abdominal pain, pink stool and what she perceived as gross hematuria. Pt states had large quantity of beets yesterday and then today had changes in urine. Pt was recently diagnosed with UC and started on prednisone and lialda. Pt denied having any fever/chills, mucus, constipation/diarrhea, inability to pass flatus, nausea or vomiting. For about the past week pt complained of some right ear pain associated with some sinus pressure but no cough, recent sick contacts, or PND. Pt has been using herbal OTC nasal spray with some relief. During interview Dr. Olevia Perches from GI was evaluating pt and completed rectal exam and hemeoccult test which was negative.   Past Medical History  Diagnosis Date  . Asthma   . Mitral valve prolapse   . Chronic kidney disease   . Migraine   . Orthostatic hypotension   . Ulcerative colitis 03/31/2012  . Kidney stone     Past Surgical History  Procedure Laterality Date  . Cesarean section    . Thyroidectomy    . Cholecystectomy    . Colonoscopy      Family History  Problem Relation Age of Onset  . Cancer Paternal Aunt   . Heart disease Paternal Grandmother     History  Substance Use Topics  . Smoking status: Never Smoker   . Smokeless tobacco: Not on file  . Alcohol Use: No    OB History   Grav Para Term Preterm Abortions TAB SAB Ect Mult Living   2    2  2   2       Review of Systems otherwise negative unless listed in HPI  Allergies  Other; Aspirin; Penicillins; Imitrex; Pepcid; and Prednisone  Home Medications   Current Outpatient Rx  Name  Route  Sig  Dispense  Refill  .  levothyroxine (SYNTHROID, LEVOTHROID) 125 MCG tablet   Oral   Take 125 mcg by mouth every morning.          . mesalamine (LIALDA) 1.2 G EC tablet   Oral   Take 2,400 mg by mouth at bedtime.          . predniSONE (DELTASONE) 20 MG tablet   Oral   Take 30 mg by mouth daily. take for 14 days         . verapamil (CALAN) 40 MG tablet   Oral   Take 40 mg by mouth every evening.          . Vitamin D, Ergocalciferol, (DRISDOL) 50000 UNITS CAPS   Oral   Take 50,000 Units by mouth every 30 (thirty) days. Takes on the 7th of the month           BP 125/81  Pulse 90  Temp(Src) 98 F (36.7 C) (Oral)  Resp 16  SpO2 98%  Physical Exam General: resting in bed HEENT: PERRL, EOMI, no scleral icterus, some cerumen in right ear, right TM bulging, tragus tenderness with upward pull, canal slightly erythematous no exudates, left TM pearly grey   Cardiac: RRR, no rubs, murmurs or gallops Pulm: clear to auscultation bilaterally, moving normal volumes of air Abd: soft, nontender, nondistended, BS present Ext: warm and well  perfused, no pedal edema, some erythema across face and arms bilaterally Neuro: alert and oriented X3, cranial nerves II-XII grossly intact   ED Course  Procedures (including critical care time)  Labs Reviewed  CBC WITH DIFFERENTIAL - Abnormal; Notable for the following:    WBC 14.2 (*)    Neutro Abs 8.6 (*)    Lymphs Abs 4.2 (*)    Monocytes Absolute 1.4 (*)    All other components within normal limits  COMPREHENSIVE METABOLIC PANEL - Abnormal; Notable for the following:    AST 53 (*)    ALT 101 (*)    GFR calc non Af Amer 83 (*)    All other components within normal limits  LIPASE, BLOOD - Abnormal; Notable for the following:    Lipase 75 (*)    All other components within normal limits  URINALYSIS, MICROSCOPIC ONLY - Abnormal; Notable for the following:    Color, Urine RED (*)    APPearance CLOUDY (*)    Specific Gravity, Urine 1.035 (*)    Bilirubin  Urine MODERATE (*)    Ketones, ur 15 (*)    Nitrite POSITIVE (*)    Leukocytes, UA MODERATE (*)    Crystals CA OXALATE CRYSTALS (*)    All other components within normal limits   No results found.   1. Universal ulcerative (chronic) colitis   2. Hemorrhage of rectum and anus   3. Transaminitis       MDM  1. Beeturia: pt recent consumption of beets causing pink colored stool as hemeoccult was negative and UA consistent with beeturia and also UTI with +nitrite and +leukocyte esterase. Pt was given prescription for ciprofloxacin given PCN allergy.   2. Abdominal pain: pt evaluated by GI (Dr. Olevia Perches) that didn't find anything on rectal exam and felt to be exacerbated flare of UC. GI gave prescription for increased prednisone and mesalamine along with antispasmotic and told to follow up with Dr. Collene Mares on Monday.   3. Otitis Externa: pt has had recurrent OE in the past and has some associated sinus pressure. Given prescription of corticosporin otic suspension and follow up with PCP this week for evaluation given her current prednisone use.   Pt was seen and evaluated with Dr. Canary Brim.         Clinton Gallant, MD 09/21/12 1739

## 2012-09-21 NOTE — ED Provider Notes (Signed)
I saw and evaluated the patient, reviewed the resident's note and I agree with the findings and plan.  Threasa Beards, MD 09/21/12 1740

## 2012-09-21 NOTE — ED Notes (Signed)
Pt reports abd pain, cramping and bright red rectal bleeding. Hx of ulcerative colitis.

## 2012-09-21 NOTE — Progress Notes (Signed)
Subjective Passing blood with each bowl movement since yesterday, frequent stools 4-5x/day, crampy abdominal pain, also right ear hurts and her urine is red  Objective: 52 yo WF with UC x 7 years, followed by Dr Collene Mares, Dx made in [place] on colonoscopy, she was initially on Prednisone and Asacol, later  On moved to [place] where she started having a flare up 07/2011, started on Lialda 1.2 gm two/day. Most recently evaluated for abnl LFT's and Right arm pain and swelling and was seen by Dr Estanislado Pandy. She had prior chlecystectomy after having an episode of pancreatitis. She denies fever. Stools are very soft, fluffy and have red straks in it. She was started on Prednisone 20m x2 weeks, 460mx1 week, 3064mCurrently x several days. She had red beets last night.Last colonoscopy in [place] 3-4 years ago Vital signs in last 24 hours: Temp:  [98.4 F (36.9 C)] 98.4 F (36.9 C) (03/02 1240) Pulse Rate:  [86] 86 (03/02 1240) Resp:  [18] 18 (03/02 1240) BP: (130)/(85) 130/85 mmHg (03/02 1240) SpO2:  [100 %] 100 % (03/02 1240)   General:   Alert,  pleasant, cooperative in NAD,Cushinoid Head:  Normocephalic and atraumatic. Eyes:  Sclera clear, no icterus.   Conjunctiva pink., R earlobe tender, exam see Dr SadGwenlyn Perkingte Mouth:  No deformity or lesions, dentition normal. Neck:  Supple; no masses or thyromegaly. Heart:  Regular rate and rhythm; no murmurs, clicks, rubs,  or gallops. Lungs:  No wheezes or rales Abdomen:  Obese, soft, difusely tender, active bowl sounds, liver [facility]: reddish stool heme negative, urine appears red- tests negative as well  Msk:  Symmetrical without gross deformities. Normal posture.right arm edematous Pulses:  Normal pulses noted. Extremities:  Without clubbing or edema. Neurologic:  Alert and  oriented x4;  grossly normal neurologically. Skin:  Intact without significant lesions or rashes.  Intake/Output from previous day:   Intake/Output this shift:    Lab  Results:  Recent Labs  09/21/12 1257  WBC 14.2*  HGB 14.3  HCT 42.3  PLT 347   BMET  Recent Labs  09/21/12 1257  NA 138  K 4.2  CL 100  CO2 29  GLUCOSE 95  BUN 16  CREATININE 0.81  CALCIUM 9.5   LFT  Recent Labs  09/21/12 1257  PROT 8.2  ALBUMIN 3.7  AST 53*  ALT 101*  ALKPHOS 96  BILITOT 0.6   PT/INR No results found for this basename: LABPROT, INR,  in the last 72 hours Hepatitis Panel No results found for this basename: HEPBSAG, HCVAB, HEPAIGM, HEPBIGM,  in the last 72 hours  Studies/Results: No results found.   ASSESSMENT:   Active Problems:   * No active hospital problems. *     PLAN:   [age] WF  With flare up of UC, reddish stools which are hemoccult negative ( had beets last night),her colitis needs to be further evaluated in an outpatient setting, We will increase Lialda to 4.8 gm/day, increase Prednisone to 40 mg/day and add Bentyl 10 mg po tid prn cramps. She will call Dr ManCollene Maresmorrow for further recommendations.  Abnormal LFT's much improved since last check 08/15/2012, s/p chole- normal ducts, s/p pancreatitis ?? Autoimmune/ will need further evaluation  Ear ache- see Dr sadGwenlyn Perkingcommendations     LOS: 0 days   DorDelfin Edis/08/2012, 4:49 PM

## 2012-09-21 NOTE — ED Notes (Signed)
Pt approached desk inquiring wait time. Reports that her blood was drawn a long time ago and she is still in the waiting room. Informed pt of status of ED at present. Pt requested to know her blood results. Pt given results of hgb and hct. Apologized for delay. Charge RN informed of same.

## 2012-09-23 LAB — URINE CULTURE: Colony Count: 30000

## 2012-10-29 ENCOUNTER — Other Ambulatory Visit (HOSPITAL_COMMUNITY): Payer: Self-pay | Admitting: Gastroenterology

## 2012-10-29 DIAGNOSIS — R7401 Elevation of levels of liver transaminase levels: Secondary | ICD-10-CM

## 2012-11-04 ENCOUNTER — Other Ambulatory Visit: Payer: Self-pay | Admitting: Radiology

## 2012-11-06 ENCOUNTER — Encounter (HOSPITAL_COMMUNITY): Payer: Self-pay | Admitting: Pharmacist

## 2012-11-07 ENCOUNTER — Ambulatory Visit (HOSPITAL_COMMUNITY)
Admission: RE | Admit: 2012-11-07 | Discharge: 2012-11-07 | Disposition: A | Discharge status: Home / Self Care | Payer: BC Managed Care – PPO | Source: Ambulatory Visit | Attending: Gastroenterology | Admitting: Gastroenterology

## 2012-11-07 ENCOUNTER — Encounter (HOSPITAL_COMMUNITY): Payer: Self-pay

## 2012-11-07 DIAGNOSIS — R748 Abnormal levels of other serum enzymes: Secondary | ICD-10-CM | POA: Insufficient documentation

## 2012-11-07 DIAGNOSIS — J45909 Unspecified asthma, uncomplicated: Secondary | ICD-10-CM | POA: Insufficient documentation

## 2012-11-07 DIAGNOSIS — R7401 Elevation of levels of liver transaminase levels: Secondary | ICD-10-CM

## 2012-11-07 DIAGNOSIS — K738 Other chronic hepatitis, not elsewhere classified: Secondary | ICD-10-CM | POA: Insufficient documentation

## 2012-11-07 DIAGNOSIS — Z79899 Other long term (current) drug therapy: Secondary | ICD-10-CM | POA: Insufficient documentation

## 2012-11-07 LAB — CBC
HCT: 40.3 % (ref 36.0–46.0)
Hemoglobin: 13.3 g/dL (ref 12.0–15.0)
MCHC: 33 g/dL (ref 30.0–36.0)
MCV: 90.6 fL (ref 78.0–100.0)

## 2012-11-07 LAB — APTT: aPTT: 26 seconds (ref 24–37)

## 2012-11-07 LAB — PROTIME-INR: INR: 0.98 (ref 0.00–1.49)

## 2012-11-07 MED ORDER — FENTANYL CITRATE 0.05 MG/ML IJ SOLN
INTRAMUSCULAR | Status: AC
  Filled 2012-11-07: qty 6

## 2012-11-07 MED ORDER — SODIUM CHLORIDE 0.9 % IV SOLN
INTRAVENOUS | Status: DC
  Administered 2012-11-07: 20 mL/h via INTRAVENOUS

## 2012-11-07 MED ORDER — MIDAZOLAM HCL 2 MG/2ML IJ SOLN
INTRAMUSCULAR | Status: AC
  Filled 2012-11-07: qty 6

## 2012-11-07 MED ORDER — MIDAZOLAM HCL 2 MG/2ML IJ SOLN
INTRAMUSCULAR | Status: AC | PRN
  Administered 2012-11-07 (×4): 1 mg via INTRAVENOUS

## 2012-11-07 MED ORDER — KETOROLAC TROMETHAMINE 30 MG/ML IJ SOLN
30.0000 mg | Freq: Once | INTRAMUSCULAR | Status: AC
  Administered 2012-11-07: 30 mg via INTRAVENOUS
  Filled 2012-11-07 (×2): qty 1

## 2012-11-07 MED ORDER — FENTANYL CITRATE 0.05 MG/ML IJ SOLN
INTRAMUSCULAR | Status: AC | PRN
  Administered 2012-11-07 (×4): 50 ug via INTRAVENOUS

## 2012-11-07 NOTE — H&P (Signed)
Chief Complaint: "I'm here for liver biopsy" Referring Physician:Mann HPI: Danielle Bond is an 52 y.o. female with elevated liver enzymes, possibly related to medications. She is referred for random liver biopsy to assist in workup. PMHx and meds reviewed. She is feeling well otherwise.  Past Medical History:  Past Medical History  Diagnosis Date  . Asthma   . Mitral valve prolapse   . Chronic kidney disease   . Migraine   . Orthostatic hypotension   . Ulcerative colitis 03/31/2012  . Kidney stone     Past Surgical History:  Past Surgical History  Procedure Laterality Date  . Cesarean section    . Thyroidectomy    . Cholecystectomy    . Colonoscopy      Family History:  Family History  Problem Relation Age of Onset  . Cancer Paternal Aunt   . Heart disease Paternal Grandmother     Social History:  reports that she has never smoked. She does not have any smokeless tobacco history on file. She reports that she does not drink alcohol or use illicit drugs.  Allergies:  Allergies  Allergen Reactions  . Other Anaphylaxis    Almonds, tree nuts  . Aspirin Hives and Nausea Only  . Penicillins Hives and Swelling  . Imitrex (Sumatriptan) Swelling    Injection caused swelling  . Stadol (Butorphanol)     hallucinates  . Vancomycin Hives  . Pepcid (Famotidine) Nausea And Vomiting  . Prednisone Nausea And Vomiting    All steroids causes nausea and pancreatitis     Medications: levothyroxine (SYNTHROID, LEVOTHROID) 125 MCG tablet (Taking) Sig - Route: Take 125 mcg by mouth every morning. - Oral Class: Historical Med Number of times this order has been changed since signing: 3 Order [address] mesalamine (LIALDA) 1.2 G EC tablet (Taking) Sig - Route: Take 4,800 mg by mouth daily. - Oral Class: Historical Med Number of times this order has been changed since signing: 3 [address] Vitamin D, Ergocalciferol, (DRISDOL) 50000 UNITS CAPS (Taking) Sig - Route: Take 50,000 Units  by mouth every 30 (thirty) days. Takes on the 7th of the month    Please HPI for pertinent positives, otherwise complete 10 system ROS negative.  Physical Exam: Temp: 98.1, HR: 86, RR: 18, BP: 98/53, O2: 100%   General Appearance:  Alert, cooperative, no distress, appears stated age  Head:  Normocephalic, without obvious abnormality, atraumatic  ENT: Unremarkable  Neck: Supple, symmetrical, trachea midline  Lungs:   Clear to auscultation bilaterally, no w/r/r, respirations unlabored without use of accessory muscles.  Heart:  Regular rate and rhythm, S1, S2 normal, no murmur, rub or gallop.   Abdomen:   Soft, non-tender, non distended. Bowel sounds active all four quadrants,  no masses, no organomegaly.  Neurologic: Normal affect, no gross deficits.   Results for orders placed during the hospital encounter of 11/07/12 (from the past 48 hour(s))  APTT     Status: None   Collection Time    11/07/12  8:45 AM      Result Value Range   aPTT 26  24 - 37 seconds  CBC     Status: None   Collection Time    11/07/12  8:45 AM      Result Value Range   WBC 7.5  4.0 - 10.5 K/uL   RBC 4.45  3.87 - 5.11 MIL/uL   Hemoglobin 13.3  12.0 - 15.0 g/dL   HCT 40.3  36.0 - 46.0 %  MCV 90.6  78.0 - 100.0 fL   MCH 29.9  26.0 - 34.0 pg   MCHC 33.0  30.0 - 36.0 g/dL   RDW 13.8  11.5 - 15.5 %   Platelets 187  150 - 400 K/uL  PROTIME-INR     Status: None   Collection Time    11/07/12  8:45 AM      Result Value Range   Prothrombin Time 12.9  11.6 - 15.2 seconds   INR 0.98  0.00 - 1.49   No results found.  Assessment/Plan Elevated liver enzymes For US guided random liver core biopsy Discussed procedure, risks, complication, and recovery. Consent signed in chart   Ascencion Dike PA-C 11/07/2012, 9:38 AM

## 2012-11-07 NOTE — Procedures (Signed)
Successful US guided Liver core biopsies X 3. No complications

## 2012-11-07 NOTE — H&P (Signed)
Agree 

## 2012-12-29 ENCOUNTER — Other Ambulatory Visit: Payer: Self-pay | Admitting: Internal Medicine

## 2012-12-29 DIAGNOSIS — G939 Disorder of brain, unspecified: Secondary | ICD-10-CM

## 2012-12-29 DIAGNOSIS — R55 Syncope and collapse: Secondary | ICD-10-CM

## 2012-12-29 DIAGNOSIS — R519 Headache, unspecified: Secondary | ICD-10-CM

## 2013-01-09 ENCOUNTER — Ambulatory Visit
Admission: RE | Admit: 2013-01-09 | Discharge: 2013-01-09 | Disposition: A | Discharge status: Home / Self Care | Payer: BC Managed Care – PPO | Source: Ambulatory Visit | Attending: Internal Medicine | Admitting: Internal Medicine

## 2013-01-09 DIAGNOSIS — R519 Headache, unspecified: Secondary | ICD-10-CM

## 2013-01-09 DIAGNOSIS — R55 Syncope and collapse: Secondary | ICD-10-CM

## 2013-01-09 DIAGNOSIS — G939 Disorder of brain, unspecified: Secondary | ICD-10-CM

## 2013-01-09 MED ORDER — GADOBENATE DIMEGLUMINE 529 MG/ML IV SOLN
10.0000 mL | Freq: Once | INTRAVENOUS | Status: AC | PRN
  Administered 2013-01-09: 10 mL via INTRAVENOUS

## 2013-02-26 ENCOUNTER — Other Ambulatory Visit: Payer: Self-pay | Admitting: Family Medicine

## 2013-02-26 DIAGNOSIS — Z1231 Encounter for screening mammogram for malignant neoplasm of breast: Secondary | ICD-10-CM

## 2013-02-26 DIAGNOSIS — N644 Mastodynia: Secondary | ICD-10-CM

## 2013-03-04 ENCOUNTER — Emergency Department (HOSPITAL_COMMUNITY)
Admission: EM | Admit: 2013-03-04 | Discharge: 2013-03-04 | Disposition: A | Discharge status: Home / Self Care | Payer: BC Managed Care – PPO | Attending: Emergency Medicine | Admitting: Emergency Medicine

## 2013-03-04 DIAGNOSIS — Z8679 Personal history of other diseases of the circulatory system: Secondary | ICD-10-CM | POA: Insufficient documentation

## 2013-03-04 DIAGNOSIS — Z8719 Personal history of other diseases of the digestive system: Secondary | ICD-10-CM | POA: Insufficient documentation

## 2013-03-04 DIAGNOSIS — R42 Dizziness and giddiness: Secondary | ICD-10-CM | POA: Insufficient documentation

## 2013-03-04 DIAGNOSIS — R079 Chest pain, unspecified: Secondary | ICD-10-CM

## 2013-03-04 DIAGNOSIS — Z87442 Personal history of urinary calculi: Secondary | ICD-10-CM | POA: Insufficient documentation

## 2013-03-04 DIAGNOSIS — R072 Precordial pain: Secondary | ICD-10-CM | POA: Insufficient documentation

## 2013-03-04 DIAGNOSIS — R110 Nausea: Secondary | ICD-10-CM | POA: Insufficient documentation

## 2013-03-04 DIAGNOSIS — R000 Tachycardia, unspecified: Secondary | ICD-10-CM | POA: Insufficient documentation

## 2013-03-04 DIAGNOSIS — J45909 Unspecified asthma, uncomplicated: Secondary | ICD-10-CM | POA: Insufficient documentation

## 2013-03-04 DIAGNOSIS — Z880 Allergy status to penicillin: Secondary | ICD-10-CM | POA: Insufficient documentation

## 2013-03-04 DIAGNOSIS — N189 Chronic kidney disease, unspecified: Secondary | ICD-10-CM | POA: Insufficient documentation

## 2013-03-04 DIAGNOSIS — Z79899 Other long term (current) drug therapy: Secondary | ICD-10-CM | POA: Insufficient documentation

## 2013-03-04 DIAGNOSIS — R0602 Shortness of breath: Secondary | ICD-10-CM | POA: Insufficient documentation

## 2013-03-04 LAB — COMPREHENSIVE METABOLIC PANEL
Albumin: 3.7 g/dL (ref 3.5–5.2)
Alkaline Phosphatase: 60 U/L (ref 39–117)
BUN: 15 mg/dL (ref 6–23)
Calcium: 9.2 mg/dL (ref 8.4–10.5)
Creatinine, Ser: 0.67 mg/dL (ref 0.50–1.10)
GFR calc Af Amer: >90 mL/min (ref >90)
Glucose, Bld: 108 mg/dL — ABNORMAL HIGH (ref 70–99)
Total Protein: 7.5 g/dL (ref 6.0–8.3)

## 2013-03-04 LAB — CBC
HCT: 36.8 % (ref 36.0–46.0)
Hemoglobin: 12.8 g/dL (ref 12.0–15.0)
MCH: 31.3 pg (ref 26.0–34.0)
MCHC: 34.8 g/dL (ref 30.0–36.0)
RDW: 13 % (ref 11.5–15.5)

## 2013-03-04 LAB — D-DIMER, QUANTITATIVE: D-Dimer, Quant: <0.27 ug/mL-FEU (ref 0.00–0.48)

## 2013-03-04 LAB — TROPONIN I
Troponin I: <0.3 ng/mL (ref <0.30)
Troponin I: <0.3 ng/mL (ref <0.30)

## 2013-03-04 MED ORDER — NITROGLYCERIN 0.4 MG SL SUBL
0.4000 mg | SUBLINGUAL_TABLET | Freq: Once | SUBLINGUAL | Status: AC
  Administered 2013-03-04: 0.4 mg via SUBLINGUAL
  Filled 2013-03-04: qty 25

## 2013-03-04 NOTE — ED Notes (Signed)
According to EMS, patient has had some light headedness, dizziness and felt some heaviness in her chest.  "like an Elephant sitting on her chest".  The patient rated the pain 10/10.  It went away so she started cleaning the bathroom and it happened again.  This time she rated her pain 7/10 so she called EMS.  According to EMS, the patient has had medication changes, stress and a recent appointment for a mammogram.  She said she has had anxiety before and it is not that.  Upon EMS arrival, they gave her nitro and it compounded her headache.They placed a #20 to the left [facility] and gave her 81m of Morphine and 451mof Zofran and it helped.  They transported her here.

## 2013-03-04 NOTE — ED Provider Notes (Signed)
CSN: [id number]     Arrival date & time 03/04/13  1514 History     First MD Initiated Contact with Patient 03/04/13 1517     Chief Complaint  Patient presents with  . Chest Pain   (Consider location/radiation/quality/duration/timing/severity/associated sxs/prior Treatment) Patient is a 52 y.o. female presenting with chest pain.  Chest Pain Pain location:  Substernal area Pain quality: pressure   Pain quality comment:  Patient report that it feels "like and elephant is sitting on my chest" Pain radiates to:  Mid back Pain radiates to the back: yes   Pain severity:  Severe Onset quality:  Sudden Duration: Minutes to hours. It occured at 10am, resolved and then returned this afternoon. Timing:  Intermittent Progression:  Unchanged Chronicity:  New Context: at rest   Relieved by:  Nothing Worsened by:  Nothing tried Ineffective treatments:  None tried Associated symptoms: dizziness, nausea and shortness of breath   Associated symptoms: no fever and not vomiting   Associated symptoms comment:  Dizziness, lightheadeness. Risk factors: obesity    Past Medical History  Diagnosis Date  . Asthma   . Mitral valve prolapse   . Chronic kidney disease   . Migraine   . Orthostatic hypotension   . Ulcerative colitis 03/31/2012  . Kidney stone    Past Surgical History  Procedure Laterality Date  . Cesarean section    . Thyroidectomy    . Cholecystectomy    . Colonoscopy     Family History  Problem Relation Age of Onset  . Cancer Paternal Aunt   . Heart disease Paternal Grandmother    History  Substance Use Topics  . Smoking status: Never Smoker   . Smokeless tobacco: Not on file  . Alcohol Use: No   OB History   Grav Para Term Preterm Abortions TAB SAB Ect Mult Living   2    2  2   2      Review of Systems  Constitutional: Negative for fever and chills.  Respiratory: Positive for shortness of breath. Negative for wheezing.   Cardiovascular: Positive for chest pain.   Gastrointestinal: Positive for nausea. Negative for vomiting.  Genitourinary: Negative for difficulty urinating.  Musculoskeletal: Negative.   Neurological: Positive for dizziness and light-headedness. Negative for syncope.  Psychiatric/Behavioral: Negative.    Allergies  Other; Aspirin; Penicillins; Imitrex; Stadol; Vancomycin; Pepcid; and Prednisone  Home Medications   Current Outpatient Rx  Name  Route  Sig  Dispense  Refill  . albuterol (PROVENTIL HFA;VENTOLIN HFA) 108 (90 BASE) MCG/ACT inhaler   Inhalation   Inhale 2 puffs into the lungs every 6 (six) hours as needed for wheezing.          Marland Kitchen azaTHIOprine (IMURAN) 50 MG tablet   Oral   Take 100 mg by mouth daily.         . cholecalciferol (VITAMIN D) 1000 UNITS tablet   Oral   Take 1,000 Units by mouth daily.         . folic acid (FOLVITE) 683 MCG tablet   Oral   Take 800 mcg by mouth daily.         . hydrocortisone (CORTEF) 10 MG tablet   Oral   Take 5-10 mg by mouth 2 (two) times daily. Takes 11m in the morning and 582mbetween 2-4PM         . levothyroxine (SYNTHROID, LEVOTHROID) 125 MCG tablet   Oral   Take 125 mcg by mouth every morning.  BP 119/76  Pulse 87  Temp(Src) 98.3 F (36.8 C) (Oral)  Resp 16  SpO2 100% Physical Exam  Constitutional: She is oriented to person, place, and time. She appears well-developed and well-nourished. No distress.  HENT:  Head: Normocephalic and atraumatic.  Eyes: No scleral icterus.  Cardiovascular: Regular rhythm.   No murmur heard. Tachycardic.  Pulmonary/Chest: Effort normal and breath sounds normal. She has no wheezes. She has no rales.  Abdominal: Soft. She exhibits no distension. There is no tenderness.  Musculoskeletal: She exhibits no edema.  Neurological: She is alert and oriented to person, place, and time.  Skin: Skin is warm and dry. No rash noted.  Psychiatric:  Appears very anxious  Breast exam also performed (patient reported that  she is going for mammogram tomorrow and that she has right breast pain recently).   Breasts: breasts appear normal, no appreciable masses, no skin or nipple changes or axillary nodes.  ED Course   Procedures (including critical care time)   Date: 03/04/2013  Rate: 78  Rhythm: normal sinus rhythm  QRS Axis: normal  Intervals: QT prolonged  ST/T Wave abnormalities: normal  Conduction Disutrbances:none  Old EKG Reviewed: changes noted - QT now prolonged.  Labs Reviewed  CBC  COMPREHENSIVE METABOLIC PANEL  TROPONIN I  D-DIMER, QUANTITATIVE   No results found. No diagnosis found.  MDM  52 year old female presents with chest pain x 2.   - History is concerning for ACS but patient has no cardiac risk factors other than obesity. - PE unlikely as patient is not currently tachycardic or tachypneic and has no O2 requirement. - Obtaining CBC, CMP, Troponin, and D-Dimer. - Will await studies.  - Aspirin not given as patient is allergic (hives) and declined.  1800 - CBC, CMP, troponin all normal.  Awaiting d-dimer.  Additional troponin ordered to rule out ACS.   1830 - Additional episode of chest pain.  No aspirin (see above). Will give SL nitro and reassess.   1930 - Pain now chest pain free following nitro.  Discussed admission with patient given recurrent pain.  Discussed risks of underlying cardiac disease (although this is unlikely given workup).  Patient understands risks/benefits and would like to be discharged home.  Patient understands that she should return if chest pain recurs.    1955 - Additional troponin negative.  Will discharge home with strict return instructions if pain returns.  Patient to follow up with PCP.   Coral Spikes, DO 03/04/13 1959

## 2013-03-04 NOTE — ED Notes (Signed)
MD at bedside. 

## 2013-03-04 NOTE — ED Notes (Signed)
Phlebotomy at bedside.

## 2013-03-05 NOTE — ED Provider Notes (Signed)
Medical screening examination/treatment/procedure(s) were conducted as a shared visit with non-physician practitioner(s) or resident and myself. I personally evaluated the patient during the encounter and agree with the findings and plan unless otherwise indicated.  Two episodes of chest heaviness with lightheaded/ diaphoresis. No known heart hx except MVP. No cardiac risks. No cp on exam. RRR no murmurs, lungs clear, no leg swelling, well appearing. Pt is anxious recently however discussed we will check her for signs of cardiac and since low risk we will add d dimer for blood clot. Patient is negative for [facility] criteria for evaluation of low risk PE. No FH cardiac or blood clots. Patient denies blood clot history, active cancer, recent major trauma or surgery, unilateral leg swelling/ pain, recent long travel, hemoptysis or oral contraceptives. QT longer than usual, medicines reviewed, no new medicines. No syncope.  On dc pt well appearing, no sxs, significant/friend with patient.  Patient has capacity to make decisions, understands benefits of hospitalization and risks of going home may result in worsening health condition including death.  Patient refuses hospital placement. Patient understands they may return at any time.    f  Mariea Clonts, MD 03/05/13 [phone]

## 2013-03-13 ENCOUNTER — Inpatient Hospital Stay: Admission: RE | Admit: 2013-03-13 | Payer: BC Managed Care – PPO | Source: Ambulatory Visit

## 2013-03-13 ENCOUNTER — Ambulatory Visit
Admission: RE | Admit: 2013-03-13 | Discharge: 2013-03-13 | Disposition: A | Discharge status: Home / Self Care | Payer: BC Managed Care – PPO | Source: Ambulatory Visit | Attending: Family Medicine | Admitting: Family Medicine

## 2013-03-13 DIAGNOSIS — N644 Mastodynia: Secondary | ICD-10-CM

## 2013-04-23 ENCOUNTER — Ambulatory Visit
Admission: RE | Admit: 2013-04-23 | Discharge: 2013-04-23 | Disposition: A | Discharge status: Home / Self Care | Payer: BC Managed Care – PPO | Source: Ambulatory Visit | Attending: Family Medicine | Admitting: Family Medicine

## 2013-04-23 DIAGNOSIS — Z1231 Encounter for screening mammogram for malignant neoplasm of breast: Secondary | ICD-10-CM

## 2013-05-28 ENCOUNTER — Other Ambulatory Visit: Payer: Self-pay

## 2013-06-09 ENCOUNTER — Other Ambulatory Visit: Payer: Self-pay | Admitting: Gastroenterology

## 2013-06-09 DIAGNOSIS — R1310 Dysphagia, unspecified: Secondary | ICD-10-CM

## 2013-06-12 ENCOUNTER — Ambulatory Visit
Admission: RE | Admit: 2013-06-12 | Discharge: 2013-06-12 | Disposition: A | Discharge status: Home / Self Care | Payer: BC Managed Care – PPO | Source: Ambulatory Visit | Attending: Gastroenterology | Admitting: Gastroenterology

## 2013-06-12 DIAGNOSIS — R1310 Dysphagia, unspecified: Secondary | ICD-10-CM

## 2013-07-14 ENCOUNTER — Encounter: Payer: Self-pay | Admitting: Family

## 2013-07-14 ENCOUNTER — Ambulatory Visit (INDEPENDENT_AMBULATORY_CARE_PROVIDER_SITE_OTHER): Payer: BC Managed Care – PPO | Admitting: Family

## 2013-07-14 VITALS — BP 110/62 | HR 100 | Ht 59.5 in | Wt 160.0 lb

## 2013-07-14 DIAGNOSIS — E039 Hypothyroidism, unspecified: Secondary | ICD-10-CM

## 2013-07-14 DIAGNOSIS — I341 Nonrheumatic mitral (valve) prolapse: Secondary | ICD-10-CM

## 2013-07-14 DIAGNOSIS — R0982 Postnasal drip: Secondary | ICD-10-CM

## 2013-07-14 DIAGNOSIS — K754 Autoimmune hepatitis: Secondary | ICD-10-CM

## 2013-07-14 DIAGNOSIS — K5190 Ulcerative colitis, unspecified, without complications: Secondary | ICD-10-CM

## 2013-07-14 DIAGNOSIS — I059 Rheumatic mitral valve disease, unspecified: Secondary | ICD-10-CM

## 2013-07-14 MED ORDER — FLUTICASONE PROPIONATE 50 MCG/ACT NA SUSP: 2.0000 | Freq: Every day | NASAL | Status: DC

## 2013-07-14 NOTE — Patient Instructions (Signed)
1. Zyrtec once a day 2. Flonase 2 sprays in each nostril once a day.

## 2013-07-15 ENCOUNTER — Other Ambulatory Visit: Payer: Self-pay | Admitting: Family

## 2013-07-15 DIAGNOSIS — E039 Hypothyroidism, unspecified: Secondary | ICD-10-CM

## 2013-07-15 LAB — CBC WITH DIFFERENTIAL/PLATELET
Basophils Absolute: 0 10*3/uL (ref 0.0–0.1)
HCT: 38.2 % (ref 36.0–46.0)
Lymphs Abs: 1.3 10*3/uL (ref 0.7–4.0)
Monocytes Relative: 7.2 % (ref 3.0–12.0)
Neutrophils Relative %: 69.9 % (ref 43.0–77.0)
Platelets: 260 10*3/uL (ref 150.0–400.0)
RDW: 13.3 % (ref 11.5–14.6)
WBC: 5.9 10*3/uL (ref 4.5–10.5)

## 2013-07-15 LAB — COMPREHENSIVE METABOLIC PANEL
ALT: 16 U/L (ref 0–35)
Albumin: 4.5 g/dL (ref 3.5–5.2)
Alkaline Phosphatase: 65 U/L (ref 39–117)
CO2: 29 mEq/L (ref 19–32)
GFR: 93.14 mL/min (ref >60.00)
Potassium: 4.7 mEq/L (ref 3.5–5.1)
Sodium: 139 mEq/L (ref 135–145)
Total Bilirubin: 0.4 mg/dL (ref 0.3–1.2)
Total Protein: 8.1 g/dL (ref 6.0–8.3)

## 2013-07-15 LAB — TSH: TSH: 0.21 u[IU]/mL — ABNORMAL LOW (ref 0.35–5.50)

## 2013-07-15 MED ORDER — LEVOTHYROXINE SODIUM 112 MCG PO TABS: 112.0000 ug | ORAL_TABLET | Freq: Every day | ORAL | Status: DC

## 2013-07-15 NOTE — Progress Notes (Signed)
Subjective:    Patient ID: Danielle Bond, female    DOB: [birth year], 52 y.o.   MRN: [id number]  HPI 52 year old new patient to the practice and to be established. She has a history of asthma, Autoimmune  hepatitis, migraines, mitral valve prolapse, hypotension, ulcerative colitis, and fibromyalgia. Patient reports that she maintains a gluten-free diet and exercises daily to help with pain. Reports having a dry cough x1 week and has been using inhalers do not help. Cough is productive with yellow phlegm. Denies any fever. She is currently under the care of gastroenterology and rheumatology.   Review of Systems  Constitutional: Negative.   HENT: Negative.   Respiratory: Negative.   Cardiovascular: Negative.   Gastrointestinal:       Ulcerative colitis   Endocrine: Negative.   Genitourinary: Negative.   Musculoskeletal: Positive for arthralgias and myalgias.  Skin: Negative.   Neurological: Negative.   Hematological: Negative.   Psychiatric/Behavioral: Negative.    Past Medical History  Diagnosis Date  . Asthma   . Mitral valve prolapse   . Chronic kidney disease   . Migraine   . Orthostatic hypotension   . Ulcerative colitis 03/31/2012  . Kidney stone     History   Social History  . Marital Status: Married    Spouse Name: N/A    Number of Children: N/A  . Years of Education: N/A   Occupational History  . Not on file.   Social History Main Topics  . Smoking status: Never Smoker   . Smokeless tobacco: Not on file  . Alcohol Use: No  . Drug Use: No  . Sexual Activity: Yes   Other Topics Concern  . Not on file   Social History Narrative  . No narrative on file    Past Surgical History  Procedure Laterality Date  . Cesarean section    . Thyroidectomy    . Cholecystectomy    . Colonoscopy      Family History  Problem Relation Age of Onset  . Cancer Paternal Aunt   . Heart disease Paternal Grandmother     Allergies  Allergen Reactions  . Other  Anaphylaxis    Almonds, tree nuts  . Aspirin Hives and Nausea Only  . Penicillins Hives and Swelling  . Imitrex [Sumatriptan] Swelling    Injection caused swelling  . Stadol [Butorphanol]     hallucinates  . Vancomycin Hives  . Pepcid [Famotidine] Nausea And Vomiting  . Prednisone Nausea And Vomiting    All steroids causes nausea and pancreatitis     Current Outpatient Prescriptions on File Prior to Visit  Medication Sig Dispense Refill  . albuterol (PROVENTIL HFA;VENTOLIN HFA) 108 (90 BASE) MCG/ACT inhaler Inhale 2 puffs into the lungs every 6 (six) hours as needed for wheezing.       Marland Kitchen azaTHIOprine (IMURAN) 50 MG tablet Take 100 mg by mouth daily.      . cholecalciferol (VITAMIN D) 1000 UNITS tablet Take 2,000 Units by mouth daily.       . folic acid (FOLVITE) 024 MCG tablet Take 800 mcg by mouth daily.      Marland Kitchen levothyroxine (SYNTHROID, LEVOTHROID) 125 MCG tablet Take 125 mcg by mouth every morning.       . hydrocortisone (CORTEF) 10 MG tablet Take 5-10 mg by mouth 2 (two) times daily. Takes 56m in the morning and 58mbetween 2-4PM       No current facility-administered medications on file prior to visit.    BP  110/62  Pulse 100  Ht 4' 11.5" (1.511 m)  Wt 160 lb (72.576 kg)  BMI 31.79 kg/m2  LMP 08/12/2014chart    Objective:   Physical Exam  Constitutional: She is oriented to person, place, and time. She appears well-developed and well-nourished.  HENT:  Right Ear: External ear normal.  Left Ear: External ear normal.  Nose: Nose normal.  Mouth/Throat: Oropharynx is clear and moist.  Neck: Normal range of motion. Neck supple.  Cardiovascular: Normal rate, regular rhythm and normal heart sounds.   Pulmonary/Chest: Effort normal and breath sounds normal.  Abdominal: Soft. Bowel sounds are normal.  Musculoskeletal: Normal range of motion.  Neurological: She is alert and oriented to person, place, and time.  Skin: Skin is warm and dry.  Psychiatric: She has a normal mood  and affect.          Assessment & Plan:  Assessment: 1.  Autoimmune hepatitis 2. Mitral valve prolapse 3. Hypokalemia 4. Ulcerative colitis 5. Fibromyalgia 6. Postnasal drip  Plan: Patient has many allergies. We'll try Flonase 2 sprays in each nostril once a day and Zyrtec over-the-counter to help with postnasal drip. Labs sent today. Followup with specialist as scheduled. Call the office with any questions or concerns. Recheck in 6 months and sooner as needed.

## 2013-07-28 ENCOUNTER — Other Ambulatory Visit: Payer: Self-pay

## 2013-07-28 ENCOUNTER — Telehealth: Payer: Self-pay | Admitting: Family

## 2013-07-28 MED ORDER — LEVOTHYROXINE SODIUM 100 MCG PO TABS: 100.0000 ug | ORAL_TABLET | Freq: Every day | ORAL | Status: DC

## 2013-07-28 NOTE — Telephone Encounter (Signed)
Pt would like blood work results °

## 2013-07-28 NOTE — Telephone Encounter (Signed)
Returned pt's call. Left detailed message to advise pt of results and that they were released to [facility] on 07/15/13. Also let pt know that Rx for Synthroid 166mg has been sent to pharmacy

## 2013-07-28 NOTE — Telephone Encounter (Signed)
Pt states that she has been on 149mg of Synthroid for 4 months.   Per Padonda, decrease dose to 1047m and rechk in 6 weeks.  Mychart message sent to pt to advise

## 2013-08-18 ENCOUNTER — Telehealth: Payer: Self-pay | Admitting: Family

## 2013-08-18 NOTE — Telephone Encounter (Signed)
Pt has a standing order from dr deveshwar rheumatologist for liver panel and electrolytes. Can I sch?

## 2013-08-18 NOTE — Telephone Encounter (Signed)
yes

## 2013-08-18 NOTE — Telephone Encounter (Signed)
Please advise 

## 2013-08-19 NOTE — Telephone Encounter (Signed)
lmom for pt to sch appt °

## 2013-08-20 NOTE — Telephone Encounter (Signed)
Pt has been sch

## 2013-08-26 ENCOUNTER — Other Ambulatory Visit (INDEPENDENT_AMBULATORY_CARE_PROVIDER_SITE_OTHER): Payer: BC Managed Care – PPO

## 2013-08-26 DIAGNOSIS — E039 Hypothyroidism, unspecified: Secondary | ICD-10-CM

## 2013-08-26 DIAGNOSIS — R7989 Other specified abnormal findings of blood chemistry: Secondary | ICD-10-CM

## 2013-08-26 LAB — HEPATIC FUNCTION PANEL
ALT: 16 U/L (ref 0–35)
AST: 29 U/L (ref 0–37)
Albumin: 4.3 g/dL (ref 3.5–5.2)
Alkaline Phosphatase: 57 U/L (ref 39–117)
BILIRUBIN DIRECT: 0 mg/dL (ref 0.0–0.3)
BILIRUBIN TOTAL: 0.6 mg/dL (ref 0.3–1.2)
Total Protein: 8.2 g/dL (ref 6.0–8.3)

## 2013-08-26 LAB — TSH: TSH: 1.49 u[IU]/mL (ref 0.35–5.50)

## 2013-08-26 LAB — ELECTROLYTE PANEL
CHLORIDE: 105 meq/L (ref 96–112)
CO2: 26 meq/L (ref 19–32)
POTASSIUM: 4 meq/L (ref 3.5–5.1)
SODIUM: 138 meq/L (ref 135–145)

## 2013-08-27 ENCOUNTER — Telehealth: Payer: Self-pay | Admitting: *Deleted

## 2013-08-27 NOTE — Telephone Encounter (Signed)
m °

## 2013-10-05 ENCOUNTER — Ambulatory Visit (INDEPENDENT_AMBULATORY_CARE_PROVIDER_SITE_OTHER): Payer: BC Managed Care – PPO | Admitting: Family

## 2013-10-05 ENCOUNTER — Encounter: Payer: Self-pay | Admitting: Family

## 2013-10-05 VITALS — BP 144/94 | HR 104 | Wt 160.0 lb

## 2013-10-05 DIAGNOSIS — E039 Hypothyroidism, unspecified: Secondary | ICD-10-CM

## 2013-10-05 DIAGNOSIS — R413 Other amnesia: Secondary | ICD-10-CM

## 2013-10-05 DIAGNOSIS — G43909 Migraine, unspecified, not intractable, without status migrainosus: Secondary | ICD-10-CM

## 2013-10-05 DIAGNOSIS — H538 Other visual disturbances: Secondary | ICD-10-CM

## 2013-10-05 DIAGNOSIS — Z111 Encounter for screening for respiratory tuberculosis: Secondary | ICD-10-CM

## 2013-10-05 DIAGNOSIS — K5190 Ulcerative colitis, unspecified, without complications: Secondary | ICD-10-CM

## 2013-10-05 LAB — CBC WITH DIFFERENTIAL/PLATELET
BASOS ABS: 0.1 10*3/uL (ref 0.0–0.1)
Basophils Relative: 1.1 % (ref 0.0–3.0)
EOS ABS: 0.2 10*3/uL (ref 0.0–0.7)
Eosinophils Relative: 4.5 % (ref 0.0–5.0)
HCT: 39.3 % (ref 36.0–46.0)
HEMOGLOBIN: 13.2 g/dL (ref 12.0–15.0)
LYMPHS PCT: 34.1 % (ref 12.0–46.0)
Lymphs Abs: 1.7 10*3/uL (ref 0.7–4.0)
MCHC: 33.5 g/dL (ref 30.0–36.0)
MCV: 88.6 fl (ref 78.0–100.0)
MONOS PCT: 10.3 % (ref 3.0–12.0)
Monocytes Absolute: 0.5 10*3/uL (ref 0.1–1.0)
NEUTROS PCT: 50 % (ref 43.0–77.0)
Neutro Abs: 2.5 10*3/uL (ref 1.4–7.7)
PLATELETS: 262 10*3/uL (ref 150.0–400.0)
RBC: 4.43 Mil/uL (ref 3.87–5.11)
RDW: 13.6 % (ref 11.5–14.6)
WBC: 4.9 10*3/uL (ref 4.5–10.5)

## 2013-10-05 LAB — TSH: TSH: 5.78 u[IU]/mL — AB (ref 0.35–5.50)

## 2013-10-05 LAB — COMPREHENSIVE METABOLIC PANEL
ALT: 18 U/L (ref 0–35)
AST: 30 U/L (ref 0–37)
Albumin: 4.7 g/dL (ref 3.5–5.2)
Alkaline Phosphatase: 66 U/L (ref 39–117)
BUN: 15 mg/dL (ref 6–23)
CALCIUM: 9.4 mg/dL (ref 8.4–10.5)
CHLORIDE: 104 meq/L (ref 96–112)
CO2: 27 meq/L (ref 19–32)
CREATININE: 0.8 mg/dL (ref 0.4–1.2)
GFR: 80.94 mL/min (ref >60.00)
Glucose, Bld: 113 mg/dL — ABNORMAL HIGH (ref 70–99)
POTASSIUM: 4.1 meq/L (ref 3.5–5.1)
SODIUM: 139 meq/L (ref 135–145)
TOTAL PROTEIN: 8.5 g/dL — AB (ref 6.0–8.3)
Total Bilirubin: 0.7 mg/dL (ref 0.3–1.2)

## 2013-10-05 LAB — VITAMIN B12: VITAMIN B 12: 661 pg/mL (ref 211–911)

## 2013-10-05 NOTE — Progress Notes (Signed)
Subjective:    Patient ID: Danielle Bond, female    DOB: 09-23-1960, 53 y.o.   MRN: [id number]  HPI  53 year old Hispanic female, nonsmoker is in today for recheck of hypothyroidism. She has a history of ulcerative colitis for which she recently saw gastroenterology and is being treated. Has concerns of memory loss, blurred vision, and pressure in her head at times. She also reports putting the butter in the cabinet instead of in the refrigerator and finding it later. Also reported her husband noticed her reaching to open the refrigerator door  And forgot that she was attempting to open the door until her husband asked what she was doing. She has a family history of dementia.   Reports a dry itchy rash to her upper back x 3 days. Denies and change in soaps or lotions. Reports a change in laundry detergent.   Review of Systems  Constitutional: Negative.   HENT: Negative.   Respiratory: Negative.   Cardiovascular: Negative.   Gastrointestinal: Negative.   Endocrine: Negative.   Genitourinary: Negative.   Musculoskeletal: Negative.   Skin: Positive for rash.  Neurological:       Memory loss  Hematological: Negative.   Psychiatric/Behavioral: Negative.      Past Medical History  Diagnosis Date  . Asthma   . Mitral valve prolapse   . Chronic kidney disease   . Migraine   . Orthostatic hypotension   . Ulcerative colitis 03/31/2012  . Kidney stone     History   Social History  . Marital Status: Married    Spouse Name: N/A    Number of Children: N/A  . Years of Education: N/A   Occupational History  . Not on file.   Social History Main Topics  . Smoking status: Never Smoker   . Smokeless tobacco: Not on file  . Alcohol Use: No  . Drug Use: No  . Sexual Activity: Yes   Other Topics Concern  . Not on file   Social History Narrative  . No narrative on file    Past Surgical History  Procedure Laterality Date  . Cesarean section    . Thyroidectomy    .  Cholecystectomy    . Colonoscopy      Family History  Problem Relation Age of Onset  . Cancer Paternal Aunt   . Heart disease Paternal Grandmother     Allergies  Allergen Reactions  . Other Anaphylaxis    Almonds, tree nuts  . Aspirin Hives and Nausea Only  . Penicillins Hives and Swelling  . Imitrex [Sumatriptan] Swelling    Injection caused swelling  . Stadol [Butorphanol]     hallucinates  . Vancomycin Hives  . Pepcid [Famotidine] Nausea And Vomiting  . Prednisone Nausea And Vomiting    All steroids causes nausea and pancreatitis     Current Outpatient Prescriptions on File Prior to Visit  Medication Sig Dispense Refill  . albuterol (PROVENTIL HFA;VENTOLIN HFA) 108 (90 BASE) MCG/ACT inhaler Inhale 2 puffs into the lungs every 6 (six) hours as needed for wheezing.       Marland Kitchen amitriptyline (ELAVIL) 25 MG tablet Take 25 mg by mouth at bedtime.       Marland Kitchen azaTHIOprine (IMURAN) 50 MG tablet Take 100 mg by mouth daily.      . cholecalciferol (VITAMIN D) 1000 UNITS tablet Take 2,000 Units by mouth daily.       . fluticasone (FLONASE) 50 MCG/ACT nasal spray Place 2 sprays into both nostrils daily.  16 g  6  . folic acid (FOLVITE) 188 MCG tablet Take 800 mcg by mouth daily.      . hydrocortisone (CORTEF) 10 MG tablet Take 5-10 mg by mouth 2 (two) times daily. Takes 16m in the morning and 566mbetween 2-4PM      . levothyroxine (SYNTHROID, LEVOTHROID) 100 MCG tablet Take 1 tablet (100 mcg total) by mouth daily.  90 tablet  3   No current facility-administered medications on file prior to visit.    BP 144/94  Pulse 104  Wt 160 lb (72.576 kg)  SpO2 99%  LMP 08/12/2014chart    Objective:   Physical Exam  Constitutional: She is oriented to person, place, and time. She appears well-developed and well-nourished.  HENT:  Right Ear: External ear normal.  Left Ear: External ear normal.  Nose: Nose normal.  Mouth/Throat: Oropharynx is clear and moist.  Neck: Normal range of motion.  Neck supple.  Cardiovascular: Normal rate, regular rhythm and normal heart sounds.   Pulmonary/Chest: Effort normal and breath sounds normal.  Abdominal: Soft. Bowel sounds are normal.  Musculoskeletal: Normal range of motion. She exhibits no edema and no tenderness.  Neurological: She is alert and oriented to person, place, and time.  Skin: Rash noted.  Dry, flaky, patchy rash to the upper back.   Psychiatric: She has a normal mood and affect.          Assessment & Plan:  EiLamiahas seen today for memory loss, rash and thyroid problem.  Diagnoses and associated orders for this visit:  Memory loss - Vitamin B12 - TSH - CBC with Differential - CMP - ANA  Unspecified hypothyroidism - TSH  Migraine headache - CMP  Ulcerative colitis, unspecified - CBC with Differential  Blurred vision - CBC with Differential - ANA  Screening for tuberculosis - PPD   Will follow up pending labs. Consider CT scan of the head if labs are normal.

## 2013-10-06 LAB — ANTI-NUCLEAR AB-TITER (ANA TITER): ANA Titer 1: 1:1280 {titer} — ABNORMAL HIGH

## 2013-10-06 LAB — ANA: Anti Nuclear Antibody(ANA): POSITIVE — AB

## 2013-10-07 LAB — TB SKIN TEST
Induration: 0 mm
TB SKIN TEST: NEGATIVE

## 2013-10-14 ENCOUNTER — Telehealth: Payer: Self-pay

## 2013-10-14 NOTE — Telephone Encounter (Signed)
Labs faxed to Dr. Raynelle Chary at [facility] and confirmation received

## 2013-10-14 NOTE — Telephone Encounter (Signed)
Pt Dr at  [facility] is req blood work results from 10/07/2013  Can be sent to the following fax number; [phone] [address] Curt Bears  Phone number is [phone]

## 2013-10-21 ENCOUNTER — Telehealth: Payer: Self-pay | Admitting: Family

## 2013-10-21 NOTE — Telephone Encounter (Signed)
Pt is calling stating that padonda informed her to make an appt  6 weeks from 10/07/13 to have labs done again to check her thyroid level again. There was no order in system+

## 2013-10-22 ENCOUNTER — Other Ambulatory Visit: Payer: Self-pay

## 2013-10-22 DIAGNOSIS — E039 Hypothyroidism, unspecified: Secondary | ICD-10-CM

## 2013-10-22 NOTE — Telephone Encounter (Signed)
Order placed. Please call pt to schedule lab only appointment in 6 weeks

## 2013-10-29 NOTE — Telephone Encounter (Signed)
appt scheduled for pt.

## 2013-11-09 ENCOUNTER — Other Ambulatory Visit: Payer: Self-pay | Admitting: Family Medicine

## 2013-11-09 ENCOUNTER — Encounter: Payer: Self-pay | Admitting: Family

## 2013-11-09 ENCOUNTER — Ambulatory Visit (INDEPENDENT_AMBULATORY_CARE_PROVIDER_SITE_OTHER): Payer: BC Managed Care – PPO | Admitting: Family

## 2013-11-09 VITALS — BP 114/78 | HR 98 | Temp 98.4°F | Wt 156.0 lb

## 2013-11-09 DIAGNOSIS — R238 Other skin changes: Secondary | ICD-10-CM

## 2013-11-09 DIAGNOSIS — R233 Spontaneous ecchymoses: Secondary | ICD-10-CM

## 2013-11-09 LAB — CBC WITH DIFFERENTIAL/PLATELET
Basophils Absolute: 0 10*3/uL (ref 0.0–0.1)
Basophils Relative: 0.8 % (ref 0.0–3.0)
Eosinophils Absolute: 0.1 10*3/uL (ref 0.0–0.7)
Eosinophils Relative: 2 % (ref 0.0–5.0)
HCT: 36.8 % (ref 36.0–46.0)
Hemoglobin: 12.5 g/dL (ref 12.0–15.0)
Lymphocytes Relative: 32.5 % (ref 12.0–46.0)
Lymphs Abs: 1.8 10*3/uL (ref 0.7–4.0)
MCHC: 33.8 g/dL (ref 30.0–36.0)
MCV: 88.3 fl (ref 78.0–100.0)
Monocytes Absolute: 0.5 10*3/uL (ref 0.1–1.0)
Monocytes Relative: 8.9 % (ref 3.0–12.0)
Neutro Abs: 3.1 10*3/uL (ref 1.4–7.7)
Neutrophils Relative %: 55.8 % (ref 43.0–77.0)
Platelets: 286 10*3/uL (ref 150.0–400.0)
RBC: 4.17 Mil/uL (ref 3.87–5.11)
RDW: 13.7 % (ref 11.5–14.6)
WBC: 5.5 10*3/uL (ref 4.5–10.5)

## 2013-11-09 NOTE — Patient Instructions (Signed)
1. Stop Ibuprofen. Call if need medication to help with dental pain.

## 2013-11-09 NOTE — Progress Notes (Signed)
Subjective:    Patient ID: Danielle Bond, female    DOB: 06/29/1961, 53 y.o.   MRN: [id number]  HPI 53 year old female, nonsmoker, is in today with c/o bruising since being on Ibuprofen 600 mg 4 times a day after a tooth extraction on 4.13.15. Pain has decreased. She has been following up every 3 days and pain is better.     Review of Systems  Constitutional: Negative.   Respiratory: Negative.   Cardiovascular: Negative.   Allergic/Immunologic: Negative.   Hematological: Bruises/bleeds easily.       Bruise to the right arm and left breast.   Psychiatric/Behavioral: Negative.    Past Medical History  Diagnosis Date  . Asthma   . Mitral valve prolapse   . Chronic kidney disease   . Migraine   . Orthostatic hypotension   . Ulcerative colitis 03/31/2012  . Kidney stone     History   Social History  . Marital Status: Married    Spouse Name: N/A    Number of Children: N/A  . Years of Education: N/A   Occupational History  . Not on file.   Social History Main Topics  . Smoking status: Never Smoker   . Smokeless tobacco: Not on file  . Alcohol Use: No  . Drug Use: No  . Sexual Activity: Yes   Other Topics Concern  . Not on file   Social History Narrative  . No narrative on file    Past Surgical History  Procedure Laterality Date  . Cesarean section    . Thyroidectomy    . Cholecystectomy    . Colonoscopy      Family History  Problem Relation Age of Onset  . Cancer Paternal Aunt   . Heart disease Paternal Grandmother     Allergies  Allergen Reactions  . Other Anaphylaxis    Almonds, tree nuts  . Aspirin Hives and Nausea Only  . Penicillins Hives and Swelling  . Imitrex [Sumatriptan] Swelling    Injection caused swelling  . Stadol [Butorphanol]     hallucinates  . Vancomycin Hives  . Pepcid [Famotidine] Nausea And Vomiting  . Prednisone Nausea And Vomiting    All steroids causes nausea and pancreatitis     Current Outpatient Prescriptions on  File Prior to Visit  Medication Sig Dispense Refill  . albuterol (PROVENTIL HFA;VENTOLIN HFA) 108 (90 BASE) MCG/ACT inhaler Inhale 2 puffs into the lungs every 6 (six) hours as needed for wheezing.       Marland Kitchen amitriptyline (ELAVIL) 25 MG tablet Take 25 mg by mouth at bedtime.       Marland Kitchen azaTHIOprine (IMURAN) 50 MG tablet Take 100 mg by mouth daily.      . cholecalciferol (VITAMIN D) 1000 UNITS tablet Take 2,000 Units by mouth daily.       . fluticasone (FLONASE) 50 MCG/ACT nasal spray Place 2 sprays into both nostrils daily.  16 g  6  . folic acid (FOLVITE) 222 MCG tablet Take 800 mcg by mouth daily.      . hydrocortisone (CORTEF) 10 MG tablet Take 5-10 mg by mouth 2 (two) times daily. Takes 33m in the morning and 575mbetween 2-4PM      . levothyroxine (SYNTHROID, LEVOTHROID) 100 MCG tablet Take 1 tablet (100 mcg total) by mouth daily.  90 tablet  3   No current facility-administered medications on file prior to visit.    BP 114/78  Pulse 98  Temp(Src) 98.4 F (36.9 C) (Oral)  Wt 156 lb (70.761 kg)  SpO2 99%  LMP 08/12/2014chart    Objective:   Physical Exam  Constitutional: She is oriented to person, place, and time. She appears well-developed and well-nourished.  Neck: Normal range of motion. Neck supple.  Cardiovascular: Normal rate, regular rhythm and normal heart sounds.   Pulmonary/Chest: Effort normal and breath sounds normal.  Neurological: She is alert and oriented to person, place, and time.  Skin: Skin is dry.  1cm bruise the to the right arm. 2nd bruise to right breast approx 1.5cm and 3rd bruise to left posterior leg approx 1 cm.   Psychiatric: She has a normal mood and affect.          Assessment & Plan:  Danielle Bond was seen today for bleeding/bruising.  Diagnoses and associated orders for this visit:  Abnormal bruising - CBC with Differential   Call the office with any questions or concerns. Recheck as needed. Stop Ibuprofen.

## 2013-11-09 NOTE — Progress Notes (Signed)
Pre visit review using our clinic review tool, if applicable. No additional management support is needed unless otherwise documented below in the visit note. 

## 2013-11-27 ENCOUNTER — Other Ambulatory Visit: Payer: Self-pay

## 2013-11-27 ENCOUNTER — Other Ambulatory Visit (INDEPENDENT_AMBULATORY_CARE_PROVIDER_SITE_OTHER): Payer: BC Managed Care – PPO

## 2013-11-27 ENCOUNTER — Telehealth: Payer: Self-pay | Admitting: Family

## 2013-11-27 DIAGNOSIS — E039 Hypothyroidism, unspecified: Secondary | ICD-10-CM

## 2013-11-27 DIAGNOSIS — Z79899 Other long term (current) drug therapy: Secondary | ICD-10-CM

## 2013-11-27 DIAGNOSIS — M359 Systemic involvement of connective tissue, unspecified: Secondary | ICD-10-CM

## 2013-11-27 DIAGNOSIS — R7989 Other specified abnormal findings of blood chemistry: Secondary | ICD-10-CM

## 2013-11-27 LAB — CBC WITH DIFFERENTIAL/PLATELET
BASOS ABS: 0.1 10*3/uL (ref 0.0–0.1)
BASOS PCT: 1.3 % (ref 0.0–3.0)
EOS ABS: 0.2 10*3/uL (ref 0.0–0.7)
Eosinophils Relative: 4.8 % (ref 0.0–5.0)
HCT: 38.2 % (ref 36.0–46.0)
HEMOGLOBIN: 12.8 g/dL (ref 12.0–15.0)
LYMPHS ABS: 1 10*3/uL (ref 0.7–4.0)
LYMPHS PCT: 22.5 % (ref 12.0–46.0)
MCHC: 33.5 g/dL (ref 30.0–36.0)
MCV: 89.3 fl (ref 78.0–100.0)
Monocytes Absolute: 0.4 10*3/uL (ref 0.1–1.0)
Monocytes Relative: 10 % (ref 3.0–12.0)
NEUTROS ABS: 2.7 10*3/uL (ref 1.4–7.7)
Neutrophils Relative %: 61.4 % (ref 43.0–77.0)
Platelets: 271 10*3/uL (ref 150.0–400.0)
RBC: 4.28 Mil/uL (ref 3.87–5.11)
RDW: 13.4 % (ref 11.5–15.5)
WBC: 4.4 10*3/uL (ref 4.0–10.5)

## 2013-11-27 LAB — AST: AST: 28 U/L (ref 0–37)

## 2013-11-27 LAB — ALT: ALT: 16 U/L (ref 0–35)

## 2013-11-27 LAB — CREATININE, SERUM: Creatinine, Ser: 0.8 mg/dL (ref 0.4–1.2)

## 2013-11-27 LAB — TSH: TSH: 1.32 u[IU]/mL (ref 0.35–4.50)

## 2013-11-27 NOTE — Telephone Encounter (Signed)
Patient is here in the office today and would like to speak with Padonda about putting in lab orders. She has paperwork from [facility] to have her labs ASAP for CBC, AST, ALT and Creatinine. Patient said that she is getting worse and is very worried. Would like to have her labs done today if possible as she is here in the office.

## 2013-11-27 NOTE — Telephone Encounter (Signed)
Labs ordered and drawn by [place]. Thanks!

## 2013-12-03 ENCOUNTER — Telehealth: Payer: Self-pay | Admitting: Family

## 2013-12-03 NOTE — Telephone Encounter (Signed)
Pt had labs drawn on 11/27/13 and these labs were suppose to be faxed to [facility]. Pt called to say that these labs has not been received. Please send fax lab orders to Dr Iline Oven  Fax number -  [phone]

## 2013-12-04 NOTE — Telephone Encounter (Signed)
Spoke with pt and advised that labs were faxed twice to the authorizing MD on the lab order. Pt notes that they should be sent to Dr. Gale Journey his nurse. Advised that fax number is the same but that I will refax with Dr. Jodi Mourning

## 2013-12-15 ENCOUNTER — Other Ambulatory Visit (INDEPENDENT_AMBULATORY_CARE_PROVIDER_SITE_OTHER): Payer: BC Managed Care – PPO

## 2013-12-15 DIAGNOSIS — E039 Hypothyroidism, unspecified: Secondary | ICD-10-CM

## 2013-12-15 DIAGNOSIS — I10 Essential (primary) hypertension: Secondary | ICD-10-CM

## 2013-12-15 DIAGNOSIS — E785 Hyperlipidemia, unspecified: Secondary | ICD-10-CM

## 2013-12-15 LAB — COMPREHENSIVE METABOLIC PANEL
ALBUMIN: 4.2 g/dL (ref 3.5–5.2)
ALT: 18 U/L (ref 0–35)
AST: 29 U/L (ref 0–37)
Alkaline Phosphatase: 58 U/L (ref 39–117)
BUN: 15 mg/dL (ref 6–23)
CO2: 27 mEq/L (ref 19–32)
CREATININE: 0.7 mg/dL (ref 0.4–1.2)
Calcium: 9.3 mg/dL (ref 8.4–10.5)
Chloride: 104 mEq/L (ref 96–112)
GFR: 91.48 mL/min (ref >60.00)
GLUCOSE: 87 mg/dL (ref 70–99)
Potassium: 4.4 mEq/L (ref 3.5–5.1)
Sodium: 139 mEq/L (ref 135–145)
Total Bilirubin: 0.6 mg/dL (ref 0.2–1.2)
Total Protein: 8 g/dL (ref 6.0–8.3)

## 2013-12-15 LAB — POCT URINALYSIS DIPSTICK
Bilirubin, UA: NEGATIVE
Blood, UA: NEGATIVE
GLUCOSE UA: NEGATIVE
NITRITE UA: NEGATIVE
PROTEIN UA: NEGATIVE
Spec Grav, UA: 1.025
Urobilinogen, UA: 0.2
pH, UA: 5.5

## 2013-12-15 LAB — TSH: TSH: 0.78 u[IU]/mL (ref 0.35–4.50)

## 2013-12-15 LAB — LIPID PANEL
CHOL/HDL RATIO: 4
Cholesterol: 290 mg/dL — ABNORMAL HIGH (ref 0–200)
HDL: 74.2 mg/dL (ref >39.00)
LDL CALC: 203 mg/dL — AB (ref 0–99)
TRIGLYCERIDES: 63 mg/dL (ref 0.0–149.0)
VLDL: 12.6 mg/dL (ref 0.0–40.0)

## 2013-12-17 ENCOUNTER — Other Ambulatory Visit: Payer: Self-pay | Admitting: Family

## 2013-12-17 MED ORDER — SIMVASTATIN 20 MG PO TABS: 20.0000 mg | ORAL_TABLET | Freq: Every day | ORAL | Status: DC

## 2013-12-18 ENCOUNTER — Ambulatory Visit (INDEPENDENT_AMBULATORY_CARE_PROVIDER_SITE_OTHER): Payer: BC Managed Care – PPO

## 2013-12-18 ENCOUNTER — Telehealth: Payer: Self-pay | Admitting: Family

## 2013-12-18 VITALS — BP 122/76 | HR 88 | Resp 16 | Ht 60.0 in | Wt 152.0 lb

## 2013-12-18 DIAGNOSIS — S90129A Contusion of unspecified lesser toe(s) without damage to nail, initial encounter: Secondary | ICD-10-CM

## 2013-12-18 DIAGNOSIS — B351 Tinea unguium: Secondary | ICD-10-CM

## 2013-12-18 NOTE — Patient Instructions (Signed)
Onychomycosis/Fungal Toenails  WHAT IS IT? An infection that lies within the keratin of your nail plate that is caused by a fungus.  WHY ME? Fungal infections affect all ages, sexes, races, and creeds.  There may be many factors that predispose you to a fungal infection such as age, coexisting medical conditions such as diabetes, or an autoimmune disease; stress, medications, fatigue, genetics, etc.  Bottom line: fungus thrives in a warm, moist environment and your shoes offer such a location.  IS IT CONTAGIOUS? Theoretically, yes.  You do not want to share shoes, nail clippers or files with someone who has fungal toenails.  Walking around barefoot in the same room or sleeping in the same bed is unlikely to transfer the organism.  It is important to realize, however, that fungus can spread easily from one nail to the next on the same foot.  HOW DO WE TREAT THIS?  There are several ways to treat this condition.  Treatment may depend on many factors such as age, medications, pregnancy, liver and kidney conditions, etc.  It is best to ask your doctor which options are available to you.  1. No treatment.   Unlike many other medical concerns, you can live with this condition.  However for many people this can be a painful condition and may lead to ingrown toenails or a bacterial infection.  It is recommended that you keep the nails cut short to help reduce the amount of fungal nail. 2. Topical treatment.  These range from herbal remedies to prescription strength nail lacquers.  About 40-50% effective, topicals require twice daily application for approximately 9 to 12 months or until an entirely new nail has grown out.  The most effective topicals are medical grade medications available through physicians offices. 3. Oral antifungal medications.  With an 80-90% cure rate, the most common oral medication requires 3 to 4 months of therapy and stays in your system for a year as the new nail grows out.  Oral  antifungal medications do require blood work to make sure it is a safe drug for you.  A liver function panel will be performed prior to starting the medication and after the first month of treatment.  It is important to have the blood work performed to avoid any harmful side effects.  In general, this medication safe but blood work is required. 4. Laser Therapy.  This treatment is performed by applying a specialized laser to the affected nail plate.  This therapy is noninvasive, fast, and non-painful.  It is not covered by insurance and is therefore, out of pocket.  The results have been very good with a 80-95% cure rate.  [facility] is the only practice in the area to offer this therapy. 5. Permanent Nail Avulsion.  Removing the entire nail so that a new nail will not grow back.  Some over-the-counter remedies are also beneficial are Tea tree oil or Fungi-Nail.

## 2013-12-18 NOTE — Telephone Encounter (Signed)
Left message to advise pt of results. Will discuss further on 12/22/13

## 2013-12-18 NOTE — Telephone Encounter (Addendum)
Pt does not go on my chart. Pt stated her pharm call her about picking up some chole med. Pt is not aware of any med NP call into pharm. Pt would like nurse to call her back. Pt has appt on 12-22-13

## 2013-12-18 NOTE — Progress Notes (Signed)
   Subjective:    Patient ID: Aracelia Brinson, female    DOB: February 24, 1961, 53 y.o.   MRN: [id number]  HPI Comments: Right fifth toenail and the great toenail . The toenail is thick and stabbing the toe .  The fifth toenail on right foot has been going on for 1 year and the great toenail has been about 1.5 years. Been using a topical for the toenail penlac. Also naftin gel for the great toe and second toe.      Review of Systems  All other systems reviewed and are negative.      Objective:   Physical Exam Neurovascular status is intact pedal pulses are palpable epicritic and proprioceptive sensations intact and symmetric bilateral. There is some discoloration friability and dystrophy of the fifth digit nail plate on the right foot as well as the distal 1/5 of the hallux nail plate on the right hallux. Remaining nails otherwise unremarkable normal trophic patient has clearance of the nails has been using Penlac for about 9 months patient also at this time may switch over to topical T. tree oil or Fungi-Nail for discussion. We'll do this is a maintenance to prevent recurrence also suggested filing or using an Warren Lacy board to smooth the dystrophic nail the fifth toe right patient is advised that the fifth toe because of fungal infections and trauma from shoe may always retain some dystrophy or deformity he may not always be just a fungus however since they are not painful and symptomatic my recommendation is conservative care at this time.       Assessment & Plan:  Assessment onychomycosis first and fifth nails right foot is resolved are almost completely or improve our suggested continue topical maintenance basis once or twice a month for prevention of recurrence also recommended nail file room or board to smooth and dystrophy of the fifth digit nail.  Harriet Masson DPM

## 2013-12-21 ENCOUNTER — Ambulatory Visit: Payer: BC Managed Care – PPO | Admitting: Family

## 2013-12-22 ENCOUNTER — Ambulatory Visit (INDEPENDENT_AMBULATORY_CARE_PROVIDER_SITE_OTHER): Payer: BC Managed Care – PPO | Admitting: Family

## 2013-12-22 ENCOUNTER — Encounter: Payer: Self-pay | Admitting: Family

## 2013-12-22 VITALS — BP 120/80 | HR 108 | Temp 97.8°F | Wt 155.0 lb

## 2013-12-22 DIAGNOSIS — E039 Hypothyroidism, unspecified: Secondary | ICD-10-CM

## 2013-12-22 DIAGNOSIS — IMO0001 Reserved for inherently not codable concepts without codable children: Secondary | ICD-10-CM

## 2013-12-22 DIAGNOSIS — M797 Fibromyalgia: Secondary | ICD-10-CM

## 2013-12-22 DIAGNOSIS — E7800 Pure hypercholesterolemia, unspecified: Secondary | ICD-10-CM

## 2013-12-22 NOTE — Progress Notes (Signed)
Subjective:    Patient ID: Danielle Bond, female    DOB: [birth year], 53 y.o.   MRN: [id number]  HPI  53 year old female, nonsmoker, is in today to discuss labs. He total cholesterol was found to be 290. She has concerns because she reports that she wasn't fasting. Has a reports she had normal cholesterol several months ago. They like to have her cholesterol rechecked prior to initiating medication. She has a history of hypothyroidism. She is under the care of rheumatology at [facility] for fibromyalgia. They have ruled out lupus. Reports increased stress dealing with her husband family. Tearful during her visit today.  Review of Systems  Constitutional: Negative.   HENT: Negative.   Respiratory: Negative.   Cardiovascular: Negative.   Gastrointestinal: Negative.   Endocrine: Negative.   Genitourinary: Negative.   Musculoskeletal: Positive for myalgias.  Skin: Negative.   Neurological: Negative.   Hematological: Negative.   Psychiatric/Behavioral: Positive for sleep disturbance. The patient is nervous/anxious.    Past Medical History  Diagnosis Date  . Asthma   . Mitral valve prolapse   . Chronic kidney disease   . Migraine   . Orthostatic hypotension   . Ulcerative colitis 03/31/2012  . Kidney stone     History   Social History  . Marital Status: Married    Spouse Name: N/A    Number of Children: N/A  . Years of Education: N/A   Occupational History  . Not on file.   Social History Main Topics  . Smoking status: Never Smoker   . Smokeless tobacco: Not on file  . Alcohol Use: No  . Drug Use: No  . Sexual Activity: Yes   Other Topics Concern  . Not on file   Social History Narrative  . No narrative on file    Past Surgical History  Procedure Laterality Date  . Cesarean section    . Thyroidectomy    . Cholecystectomy    . Colonoscopy      Family History  Problem Relation Age of Onset  . Cancer Paternal Aunt   . Heart disease Paternal Grandmother      Allergies  Allergen Reactions  . Almond Oil Hives and Shortness Of Breath  . Other Anaphylaxis    Almonds, tree nuts  . Aspirin Hives and Nausea Only    Stomach cramps  . Penicillins Hives and Swelling    Stomach cramps  . Corticosteroids     Had acute pancreatitis x 2 after steroid use.  . Imitrex [Sumatriptan] Swelling    Injection caused swelling  . Stadol [Butorphanol]     hallucinates  . Vancomycin Hives  . Pepcid [Famotidine] Nausea And Vomiting    Makes acid worse  . Prednisone Nausea And Vomiting    All steroids causes nausea and pancreatitis     Current Outpatient Prescriptions on File Prior to Visit  Medication Sig Dispense Refill  . albuterol (PROVENTIL HFA;VENTOLIN HFA) 108 (90 BASE) MCG/ACT inhaler Inhale 2 puffs into the lungs every 6 (six) hours as needed for wheezing.       Marland Kitchen azaTHIOprine (IMURAN) 50 MG tablet Take 100 mg by mouth daily.      . cholecalciferol (VITAMIN D) 1000 UNITS tablet Take 2,000 Units by mouth daily.       Marland Kitchen levothyroxine (SYNTHROID, LEVOTHROID) 100 MCG tablet Take 1 tablet (100 mcg total) by mouth daily.  90 tablet  3   No current facility-administered medications on file prior to visit.  BP 120/80  Pulse 108  Temp(Src) 97.8 F (36.6 C) (Oral)  Wt 155 lb (70.308 kg)  LMP 08/12/2014chart     Objective:   Physical Exam  Constitutional: She is oriented to person, place, and time. She appears well-developed and well-nourished.  Neck: Normal range of motion. Neck supple.  Cardiovascular: Normal rate, regular rhythm and normal heart sounds.   Pulmonary/Chest: Effort normal and breath sounds normal.  Abdominal: Soft. Bowel sounds are normal.  Musculoskeletal: Normal range of motion.  Neurological: She is alert and oriented to person, place, and time.  Skin: Skin is warm and dry.  Psychiatric: She has a normal mood and affect.          Assessment & Plan:  Danielle Bond was seen today for no specified reason.  Diagnoses and  associated orders for this visit:  Fibromyalgia  Unspecified hypothyroidism  Pure hypercholesterolemia - Lipid Panel; Future   Encouraged psychotherapy. Encourage exercise 1 hour a minimum of 3 days a week. Recheck lipids fasting.

## 2013-12-22 NOTE — Patient Instructions (Addendum)
1. Danielle Bond for psychotherapy 2. Exercise 3 times a week x 1 hour.   Stress Management Stress is a state of physical or mental tension that often results from changes in your life or normal routine. Some common causes of stress are:  Death of a loved one.  Injuries or severe illnesses.  Getting fired or changing jobs.  Moving into a new home. Other causes may be:  Sexual problems.  Business or financial losses.  Taking on a large debt.  Regular conflict with someone at home or at work.  Constant tiredness from lack of sleep. It is not just bad things that are stressful. It may be stressful to:  Win the lottery.  Get married.  Buy a new car. The amount of stress that can be easily tolerated varies from person to person. Changes generally cause stress, regardless of the types of change. Too much stress can affect your health. It may lead to physical or emotional problems. Too little stress (boredom) may also become stressful. SUGGESTIONS TO REDUCE STRESS:  Talk things over with your family and friends. It often is helpful to share your concerns and worries. If you feel your problem is serious, you may want to get help from a professional counselor.  Consider your problems one at a time instead of lumping them all together. Trying to take care of everything at once may seem impossible. List all the things you need to do and then start with the most important one. Set a goal to accomplish 2 or 3 things each day. If you expect to do too many in a single day you will naturally fail, causing you to feel even more stressed.  Do not use alcohol or drugs to relieve stress. Although you may feel better for a short time, they do not remove the problems that caused the stress. They can also be habit forming.  Exercise regularly - at least 3 times per week. Physical exercise can help to relieve that "uptight" feeling and will relax you.  The shortest distance between despair and hope is  often a good night's sleep.  Go to bed and get up on time allowing yourself time for appointments without being rushed.  Take a short "time-out" period from any stressful situation that occurs during the day. Close your eyes and take some deep breaths. Starting with the muscles in your face, tense them, hold it for a few seconds, then relax. Repeat this with the muscles in your neck, shoulders, hand, stomach, back and legs.  Take good care of yourself. Eat a balanced diet and get plenty of rest.  Schedule time for having fun. Take a break from your daily routine to relax. HOME CARE INSTRUCTIONS   Call if you feel overwhelmed by your problems and feel you can no longer manage them on your own.  Return immediately if you feel like hurting yourself or someone else. Document Released: 01/02/2001 Document Revised: 10/01/2011 Document Reviewed: 03/03/2013 [facility] Patient Information 2014 [place], [place].

## 2013-12-22 NOTE — Progress Notes (Signed)
Pre visit review using our clinic review tool, if applicable. No additional management support is needed unless otherwise documented below in the visit note. 

## 2013-12-24 ENCOUNTER — Other Ambulatory Visit (INDEPENDENT_AMBULATORY_CARE_PROVIDER_SITE_OTHER): Payer: BC Managed Care – PPO

## 2013-12-24 DIAGNOSIS — E7800 Pure hypercholesterolemia, unspecified: Secondary | ICD-10-CM

## 2013-12-24 LAB — LIPID PANEL
CHOLESTEROL: 267 mg/dL — AB (ref 0–200)
HDL: 67.5 mg/dL (ref >39.00)
LDL Cholesterol: 176 mg/dL — ABNORMAL HIGH (ref 0–99)
NonHDL: 199.5
TRIGLYCERIDES: 119 mg/dL (ref 0.0–149.0)
Total CHOL/HDL Ratio: 4
VLDL: 23.8 mg/dL (ref 0.0–40.0)

## 2013-12-30 ENCOUNTER — Telehealth: Payer: Self-pay | Admitting: Family

## 2013-12-30 ENCOUNTER — Other Ambulatory Visit: Payer: Self-pay

## 2013-12-30 DIAGNOSIS — E785 Hyperlipidemia, unspecified: Secondary | ICD-10-CM

## 2013-12-30 NOTE — Telephone Encounter (Signed)
Order placed

## 2013-12-30 NOTE — Telephone Encounter (Signed)
Pt has been scheduled on 02/04/14  cholesterol check please enter the orders for this

## 2014-01-06 ENCOUNTER — Ambulatory Visit: Payer: BC Managed Care – PPO | Admitting: Licensed Clinical Social Worker

## 2014-01-15 ENCOUNTER — Other Ambulatory Visit: Payer: Self-pay | Admitting: Obstetrics and Gynecology

## 2014-01-15 DIAGNOSIS — N644 Mastodynia: Secondary | ICD-10-CM

## 2014-01-16 ENCOUNTER — Encounter (HOSPITAL_COMMUNITY): Payer: Self-pay | Admitting: Emergency Medicine

## 2014-01-16 ENCOUNTER — Emergency Department (HOSPITAL_COMMUNITY)
Admission: EM | Admit: 2014-01-16 | Discharge: 2014-01-16 | Disposition: A | Discharge status: Home / Self Care | Payer: BC Managed Care – PPO | Attending: Emergency Medicine | Admitting: Emergency Medicine

## 2014-01-16 DIAGNOSIS — N189 Chronic kidney disease, unspecified: Secondary | ICD-10-CM | POA: Insufficient documentation

## 2014-01-16 DIAGNOSIS — Z880 Allergy status to penicillin: Secondary | ICD-10-CM | POA: Insufficient documentation

## 2014-01-16 DIAGNOSIS — Z79899 Other long term (current) drug therapy: Secondary | ICD-10-CM | POA: Insufficient documentation

## 2014-01-16 DIAGNOSIS — Z8719 Personal history of other diseases of the digestive system: Secondary | ICD-10-CM | POA: Insufficient documentation

## 2014-01-16 DIAGNOSIS — L989 Disorder of the skin and subcutaneous tissue, unspecified: Secondary | ICD-10-CM | POA: Insufficient documentation

## 2014-01-16 DIAGNOSIS — R21 Rash and other nonspecific skin eruption: Secondary | ICD-10-CM | POA: Insufficient documentation

## 2014-01-16 DIAGNOSIS — Z87442 Personal history of urinary calculi: Secondary | ICD-10-CM | POA: Insufficient documentation

## 2014-01-16 DIAGNOSIS — Z8679 Personal history of other diseases of the circulatory system: Secondary | ICD-10-CM | POA: Insufficient documentation

## 2014-01-16 DIAGNOSIS — J45909 Unspecified asthma, uncomplicated: Secondary | ICD-10-CM | POA: Insufficient documentation

## 2014-01-16 MED ORDER — DEXAMETHASONE SODIUM PHOSPHATE 10 MG/ML IJ SOLN
10.0000 mg | Freq: Once | INTRAMUSCULAR | Status: AC
  Administered 2014-01-16: 10 mg via INTRAMUSCULAR
  Filled 2014-01-16: qty 1

## 2014-01-16 MED ORDER — OXYCODONE HCL 5 MG PO TABS
5.0000 mg | ORAL_TABLET | Freq: Once | ORAL | Status: AC
  Administered 2014-01-16: 5 mg via ORAL
  Filled 2014-01-16: qty 1

## 2014-01-16 MED ORDER — HYDROXYZINE HCL 25 MG PO TABS
25.0000 mg | ORAL_TABLET | Freq: Once | ORAL | Status: AC
  Administered 2014-01-16: 25 mg via ORAL
  Filled 2014-01-16: qty 1

## 2014-01-16 MED ORDER — HYDROXYZINE HCL 25 MG PO TABS: 25.0000 mg | ORAL_TABLET | Freq: Four times a day (QID) | ORAL | Status: DC

## 2014-01-16 MED ORDER — TRIAMCINOLONE ACETONIDE 0.025 % EX OINT: 1.0000 "application " | TOPICAL_OINTMENT | Freq: Two times a day (BID) | CUTANEOUS | Status: DC

## 2014-01-16 MED ORDER — OXYCODONE HCL 5 MG PO TABS: 5.0000 mg | ORAL_TABLET | ORAL | Status: DC | PRN

## 2014-01-16 NOTE — ED Provider Notes (Signed)
Medical screening examination/treatment/procedure(s) were performed by non-physician practitioner and as supervising physician I was immediately available for consultation/collaboration.    Dorie Rank, MD 01/16/14 [phone]

## 2014-01-16 NOTE — ED Notes (Signed)
Patient states she saw a welt on her right breast area 1 1/2 weeks ago. Patient states she saw her PCP and was given hydrocortisone. The area got worse and then she was told to use an antifungal cream. Patient states the areae affected now extends down her right  Arm and her right nipple is extremely sore.

## 2014-01-16 NOTE — ED Provider Notes (Signed)
CSN: [id number]     Arrival date & time 01/16/14  0847 History   First MD Initiated Contact with Patient 01/16/14 1007     Chief Complaint  Patient presents with  . Rash  . right arm soreness      (Consider location/radiation/quality/duration/timing/severity/associated sxs/prior Treatment) HPI  Danielle Bond is a 53 y.o. female with past medical history significant for autoimmune hepatitis, ulcerative colitis complaining of pruritic and painful rash  Starting in her right breast 10 days ago. Origina.lly it was just a erythematous patch.  The rash has spread laterally to the right arm and is starting to appear on the left arm as well. Patient was seen by her OB/GYN and initially she was started on a steroid cream and then switched to an antifungal. States it is getting worse and not helping. She denies fever, chills, headache, neck stiffness, nipple discharge. Normal mammogram within the last year. Positive family history of breast cancer. Patient restarted taking omega fatty acids approximately 10 days ago.  Past Medical History  Diagnosis Date  . Asthma   . Mitral valve prolapse   . Chronic kidney disease   . Migraine   . Orthostatic hypotension   . Ulcerative colitis 03/31/2012  . Kidney stone   . Kidney stone    Past Surgical History  Procedure Laterality Date  . Cesarean section    . Thyroidectomy    . Cholecystectomy    . Colonoscopy     Family History  Problem Relation Age of Onset  . Cancer Paternal Aunt   . Heart disease Paternal Grandmother    History  Substance Use Topics  . Smoking status: Never Smoker   . Smokeless tobacco: Not on file  . Alcohol Use: No   OB History   Grav Para Term Preterm Abortions TAB SAB Ect Mult Living   2    2  2   2      Review of Systems  10 systems reviewed and found to be negative, except as noted in the HPI.   Allergies  Almond oil; Other; Aspirin; Penicillins; Acetaminophen; Corticosteroids; Imitrex; Stadol; Vancomycin;  Pepcid; and Prednisone  Home Medications   Prior to Admission medications   Medication Sig Start Date End Date Taking? Authorizing Provider  albuterol (PROVENTIL HFA;VENTOLIN HFA) 108 (90 BASE) MCG/ACT inhaler Inhale 2 puffs into the lungs every 6 (six) hours as needed for wheezing.    Yes Historical Provider, MD  azaTHIOprine (IMURAN) 50 MG tablet Take 100 mg by mouth daily.   Yes Historical Provider, MD  cholecalciferol (VITAMIN D) 1000 UNITS tablet Take 2,000 Units by mouth daily.    Yes Historical Provider, MD  levothyroxine (SYNTHROID, LEVOTHROID) 112 MCG tablet Take 112 mcg by mouth daily before breakfast.   Yes Historical Provider, MD  omega-3 acid ethyl esters (LOVAZA) 1 G capsule Take 1 g by mouth 2 (two) times daily.   Yes Historical Provider, MD  hydrOXYzine (ATARAX/VISTARIL) 25 MG tablet Take 1 tablet (25 mg total) by mouth every 6 (six) hours. 01/16/14   Shauna Bodkins, PA-C  oxyCODONE (ROXICODONE) 5 MG immediate release tablet Take 1 tablet (5 mg total) by mouth every 4 (four) hours as needed. Take 1-2 tablets every 4-6 hours as needed for pain control 01/16/14   Elmyra Ricks Amiee Wiley, PA-C  triamcinolone (KENALOG) 0.025 % ointment Apply 1 application topically 2 (two) times daily. 01/16/14   Saidah Kempton, PA-C   BP 134/72  Pulse 75  Temp(Src) 98.8 F (37.1 C) (Oral)  Resp  17  SpO2 100%  LMP 03/03/2013 Physical Exam  Nursing note and vitals reviewed. Constitutional: She is oriented to person, place, and time. She appears well-developed and well-nourished. No distress.  HENT:  Head: Normocephalic.  Mouth/Throat: Oropharynx is clear and moist.  Eyes: Conjunctivae and EOM are normal.  Cardiovascular: Normal rate and regular rhythm.   Pulmonary/Chest: Effort normal and breath sounds normal. No stridor.    No breast masses appreciated, no nipple retraction, no nipple discharge  Abdominal: Soft. There is no tenderness.  Musculoskeletal: Normal range of motion.  Neurological:  She is alert and oriented to person, place, and time.  Skin:     Hive-like excoriated rash to underside of right breast and right arm, small patch to left forearm. Rash. The mucous membranes. There is no warmth, induration or discharge.  Psychiatric: She has a normal mood and affect.    ED Course  Procedures (including critical care time) Labs Review Labs Reviewed - No data to display  Imaging Review No results found.   EKG Interpretation None      MDM   Final diagnoses:  Rash of unknown cause    Filed Vitals:   01/16/14 0856  BP: 134/72  Pulse: 75  Temp: 98.8 F (37.1 C)  TempSrc: Oral  Resp: 17  SpO2: 100%    Medications  dexamethasone (DECADRON) injection 10 mg (not administered)  oxyCODONE (Oxy IR/ROXICODONE) immediate release tablet 5 mg (not administered)  hydrOXYzine (ATARAX/VISTARIL) tablet 25 mg (not administered)    Danielle Bond is a 53 y.o. female presenting with painful) crash it started on the right breast and is now spread to the right arm and left forearm. Nondermatomal distribution. Does not appear to be infected. Has qualities consistent with allergic reaction in addition to fungal infection. We'll give Decadron, Atarax and Roxicodone. Encourage the patient to followup with both OB/GYN and dermatology. We have discussed return precautions  Evaluation does not show pathology that would require ongoing emergent intervention or inpatient treatment. Pt is hemodynamically stable and mentating appropriately. Discussed findings and plan with patient/guardian, who agrees with care plan. All questions answered. Return precautions discussed and outpatient follow up given.   New Prescriptions   HYDROXYZINE (ATARAX/VISTARIL) 25 MG TABLET    Take 1 tablet (25 mg total) by mouth every 6 (six) hours.   OXYCODONE (ROXICODONE) 5 MG IMMEDIATE RELEASE TABLET    Take 1 tablet (5 mg total) by mouth every 4 (four) hours as needed. Take 1-2 tablets every 4-6 hours as  needed for pain control   TRIAMCINOLONE (KENALOG) 0.025 % OINTMENT    Apply 1 application topically 2 (two) times daily.         Monico Blitz, PA-C 01/16/14 1146

## 2014-01-16 NOTE — Discharge Instructions (Signed)
Please follow with your primary care doctor in the next 2 days for a check-up. They must obtain records for further management.   Do not hesitate to return to the Emergency Department for any new, worsening or concerning symptoms.   If you see signs of infection (warmth, redness, tenderness, pus, sharp increase in pain, fever, red streaking) immediately return to the emergency department.   Rash A rash is a change in the color or texture of your skin. There are many different types of rashes. You may have other problems that accompany your rash. CAUSES   Infections.  Allergic reactions. This can include allergies to pets or foods.  Certain medicines.  Exposure to certain chemicals, soaps, or cosmetics.  Heat.  Exposure to poisonous plants.  Tumors, both cancerous and noncancerous. SYMPTOMS   Redness.  Scaly skin.  Itchy skin.  Dry or cracked skin.  Bumps.  Blisters.  Pain. DIAGNOSIS  Your caregiver may do a physical exam to determine what type of rash you have. A skin sample (biopsy) may be taken and examined under a microscope. TREATMENT  Treatment depends on the type of rash you have. Your caregiver may prescribe certain medicines. For serious conditions, you may need to see a skin doctor (dermatologist). HOME CARE INSTRUCTIONS   Avoid the substance that caused your rash.  Do not scratch your rash. This can cause infection.  You may take cool baths to help stop itching.  Only take over-the-counter or prescription medicines as directed by your caregiver.  Keep all follow-up appointments as directed by your caregiver. SEEK IMMEDIATE MEDICAL CARE IF:  You have increasing pain, swelling, or redness.  You have a fever.  You have new or severe symptoms.  You have body aches, diarrhea, or vomiting.  Your rash is not better after 3 days. MAKE SURE YOU:  Understand these instructions.  Will watch your condition.  Will get help right away if you are not  doing well or get worse. Document Released: 06/29/2002 Document Revised: 10/01/2011 Document Reviewed: 04/23/2011 [facility] Patient Information 2015 [place], [place]. This information is not intended to replace advice given to you by your health care provider. Make sure you discuss any questions you have with your health care provider.

## 2014-01-18 ENCOUNTER — Other Ambulatory Visit: Payer: Self-pay | Admitting: Dermatology

## 2014-01-19 ENCOUNTER — Other Ambulatory Visit: Payer: BC Managed Care – PPO

## 2014-01-19 ENCOUNTER — Ambulatory Visit
Admission: RE | Admit: 2014-01-19 | Discharge: 2014-01-19 | Disposition: A | Discharge status: Home / Self Care | Payer: BC Managed Care – PPO | Source: Ambulatory Visit | Attending: Obstetrics and Gynecology | Admitting: Obstetrics and Gynecology

## 2014-01-19 DIAGNOSIS — N644 Mastodynia: Secondary | ICD-10-CM

## 2014-01-25 ENCOUNTER — Other Ambulatory Visit: Payer: BC Managed Care – PPO

## 2014-01-26 ENCOUNTER — Telehealth: Payer: Self-pay | Admitting: Family

## 2014-01-26 NOTE — Telephone Encounter (Signed)
Appt scheduled

## 2014-01-26 NOTE — Telephone Encounter (Signed)
Pt went to ed for a rash. Pt has rash on stomach/arm/breast .. They do not know what it is. Poss side effects of liver med, not confirmed. Pt went to [facility] last week for her liver and they researched this rash also. Still unknown.  Pt will have [facility] office fax over biopsy results from rash.  Pt would like fup w/ padonda. First available is 7/20. Pt request earlier appt to make pcp aware of what is going on. pls advise

## 2014-01-26 NOTE — Telephone Encounter (Signed)
Make a 30 min slot for her please

## 2014-01-28 ENCOUNTER — Ambulatory Visit (INDEPENDENT_AMBULATORY_CARE_PROVIDER_SITE_OTHER): Payer: BC Managed Care – PPO | Admitting: Family

## 2014-01-28 ENCOUNTER — Encounter: Payer: Self-pay | Admitting: Family

## 2014-01-28 VITALS — BP 118/74 | HR 115 | Wt 154.0 lb

## 2014-01-28 DIAGNOSIS — L309 Dermatitis, unspecified: Secondary | ICD-10-CM

## 2014-01-28 DIAGNOSIS — L259 Unspecified contact dermatitis, unspecified cause: Secondary | ICD-10-CM

## 2014-01-28 DIAGNOSIS — K754 Autoimmune hepatitis: Secondary | ICD-10-CM

## 2014-01-28 DIAGNOSIS — K5190 Ulcerative colitis, unspecified, without complications: Secondary | ICD-10-CM

## 2014-01-28 MED ORDER — TRIAMCINOLONE 0.1 % CREAM:EUCERIN CREAM 1:1: 1.0000 "application " | TOPICAL_CREAM | Freq: Two times a day (BID) | CUTANEOUS | Status: DC

## 2014-01-28 NOTE — Patient Instructions (Signed)
Eczema Eczema, also called atopic dermatitis, is a skin disorder that causes inflammation of the skin. It causes a red rash and dry, scaly skin. The skin becomes very itchy. Eczema is generally worse during the cooler winter months and often improves with the warmth of summer. Eczema usually starts showing signs in infancy. Some children outgrow eczema, but it may last through adulthood.  CAUSES  The exact cause of eczema is not known, but it appears to run in families. People with eczema often have a family history of eczema, allergies, asthma, or hay fever. Eczema is not contagious. Flare-ups of the condition may be caused by:   Contact with something you are sensitive or allergic to.   Stress. SIGNS AND SYMPTOMS  Dry, scaly skin.   Red, itchy rash.   Itchiness. This may occur before the skin rash and may be very intense.  DIAGNOSIS  The diagnosis of eczema is usually made based on symptoms and medical history. TREATMENT  Eczema cannot be cured, but symptoms usually can be controlled with treatment and other strategies. A treatment plan might include:  Controlling the itching and scratching.   Use over-the-counter antihistamines as directed for itching. This is especially useful at night when the itching tends to be worse.   Use over-the-counter steroid creams as directed for itching.   Avoid scratching. Scratching makes the rash and itching worse. It may also result in a skin infection (impetigo) due to a break in the skin caused by scratching.   Keeping the skin well moisturized with creams every day. This will seal in moisture and help prevent dryness. Lotions that contain alcohol and water should be avoided because they can dry the skin.   Limiting exposure to things that you are sensitive or allergic to (allergens).   Recognizing situations that cause stress.   Developing a plan to manage stress.  HOME CARE INSTRUCTIONS   Only take over-the-counter or  prescription medicines as directed by your health care provider.   Do not use anything on the skin without checking with your health care provider.   Keep baths or showers short (5 minutes) in warm (not hot) water. Use mild cleansers for bathing. These should be unscented. You may add nonperfumed bath oil to the bath water. It is best to avoid soap and bubble bath.   Immediately after a bath or shower, when the skin is still damp, apply a moisturizing ointment to the entire body. This ointment should be a petroleum ointment. This will seal in moisture and help prevent dryness. The thicker the ointment, the better. These should be unscented.   Keep fingernails cut short. Children with eczema may need to wear soft gloves or mittens at night after applying an ointment.   Dress in clothes made of cotton or cotton blends. Dress lightly, because heat increases itching.   A child with eczema should stay away from anyone with fever blisters or cold sores. The virus that causes fever blisters (herpes simplex) can cause a serious skin infection in children with eczema. SEEK MEDICAL CARE IF:   Your itching interferes with sleep.   Your rash gets worse or is not better within 1 week after starting treatment.   You see pus or soft yellow scabs in the rash area.   You have a fever.   You have a rash flare-up after contact with someone who has fever blisters.  Document Released: 07/06/2000 Document Revised: 04/29/2013 Document Reviewed: 02/09/2013 [employer] Patient Information 2015 [employer]. This information   is not intended to replace advice given to you by your health care provider. Make sure you discuss any questions you have with your health care provider.  

## 2014-01-28 NOTE — Progress Notes (Signed)
Pre visit review using our clinic review tool, if applicable. No additional management support is needed unless otherwise documented below in the visit note. 

## 2014-01-29 ENCOUNTER — Encounter: Payer: Self-pay | Admitting: Family

## 2014-01-29 NOTE — Progress Notes (Signed)
Subjective:    Patient ID: Danielle Bond, female    DOB: 01-Oct-1960, 53 y.o.   MRN: [id number]  HPI 53 year old female, nonsmoker, with a history of autoimmune hepatitis, ulcerative colitis, hypercholesterolemia, is in today with a persistent rash x several months. She has seen a rheumatologist that suggest it may be Imuran reaction and person; in rash. She had a biopsy done through dermatology suggested dermatitis.Has been applying an antibiotic cream and steroid cream. Reports prednisone may have helped some. Otherwise rash persists and if not helping much. She was seen in ER on 01/16/2014 and prescribed Atarax that has helped.    Review of Systems  Constitutional: Negative.   HENT: Negative.   Respiratory: Negative.   Cardiovascular: Negative.   Gastrointestinal: Negative.   Endocrine: Negative.   Genitourinary: Negative.   Musculoskeletal: Negative.   Neurological: Negative.   Psychiatric/Behavioral: Negative.    Past Medical History  Diagnosis Date  . Asthma   . Mitral valve prolapse   . Chronic kidney disease   . Migraine   . Orthostatic hypotension   . Ulcerative colitis 03/31/2012  . Kidney stone   . Kidney stone     History   Social History  . Marital Status: Married    Spouse Name: N/A    Number of Children: N/A  . Years of Education: N/A   Occupational History  . Not on file.   Social History Main Topics  . Smoking status: Never Smoker   . Smokeless tobacco: Not on file  . Alcohol Use: No  . Drug Use: No  . Sexual Activity: Yes   Other Topics Concern  . Not on file   Social History Narrative  . No narrative on file    Past Surgical History  Procedure Laterality Date  . Cesarean section    . Thyroidectomy    . Cholecystectomy    . Colonoscopy      Family History  Problem Relation Age of Onset  . Cancer Paternal Aunt   . Heart disease Paternal Grandmother     Allergies  Allergen Reactions  . Almond Oil Hives and Shortness Of Breath    . Other Anaphylaxis    Almonds, tree nuts  . Aspirin Hives and Nausea Only    Stomach cramps  . Penicillins Hives and Swelling    Stomach cramps  . Acetaminophen     Has autoimmune hepatitis, wants to avoid APAP  . Corticosteroids     Had acute pancreatitis x 2 after steroid use.  . Imitrex [Sumatriptan] Swelling    Injection caused swelling  . Stadol [Butorphanol]     hallucinates  . Vancomycin Hives  . Pepcid [Famotidine] Nausea And Vomiting    Makes acid worse  . Prednisone Nausea And Vomiting    All steroids causes nausea and pancreatitis     Current Outpatient Prescriptions on File Prior to Visit  Medication Sig Dispense Refill  . albuterol (PROVENTIL HFA;VENTOLIN HFA) 108 (90 BASE) MCG/ACT inhaler Inhale 2 puffs into the lungs every 6 (six) hours as needed for wheezing.       Marland Kitchen azaTHIOprine (IMURAN) 50 MG tablet Take 100 mg by mouth daily.      . cholecalciferol (VITAMIN D) 1000 UNITS tablet Take 2,000 Units by mouth daily.       . hydrOXYzine (ATARAX/VISTARIL) 25 MG tablet Take 1 tablet (25 mg total) by mouth every 6 (six) hours.  12 tablet  0  . levothyroxine (SYNTHROID, LEVOTHROID) 112 MCG tablet  Take 112 mcg by mouth daily before breakfast.      . omega-3 acid ethyl esters (LOVAZA) 1 G capsule Take 1 g by mouth 2 (two) times daily.      Marland Kitchen triamcinolone (KENALOG) 0.025 % ointment Apply 1 application topically 2 (two) times daily.  30 g  0   No current facility-administered medications on file prior to visit.    BP 118/74  Pulse 115  Wt 154 lb (69.854 kg)  LMP 08/12/2014chart    Objective:   Physical Exam  Constitutional: She is oriented to person, place, and time. She appears well-developed and well-nourished.  HENT:  Right Ear: External ear normal.  Left Ear: External ear normal.  Nose: Nose normal.  Mouth/Throat: Oropharynx is clear and moist.  Neck: Normal range of motion. Neck supple.  Cardiovascular: Normal rate, regular rhythm and normal heart sounds.    Pulmonary/Chest: Effort normal and breath sounds normal.  Abdominal: Soft. Bowel sounds are normal.  Musculoskeletal: Normal range of motion.  Neurological: She is alert and oriented to person, place, and time.  Skin: Skin is warm and dry.  Dry, papular rash noted to the anterior chest wall, right arm.   Psychiatric: She has a normal mood and affect.          Assessment & Plan:  Charizma was seen today for rash.  Diagnoses and associated orders for this visit:  Dermatitis  Autoimmune hepatitis  Ulcerative colitis, unspecified  Other Orders - Triamcinolone Acetonide (TRIAMCINOLONE 0.1 % CREAM : EUCERIN) CREA; Apply 1 application topically 2 (two) times daily.   Call the office with any questions or concerns. Recheck as scheduled and as needed.

## 2014-02-04 ENCOUNTER — Other Ambulatory Visit: Payer: BC Managed Care – PPO

## 2014-02-08 ENCOUNTER — Ambulatory Visit: Payer: BC Managed Care – PPO | Admitting: Family

## 2014-02-15 ENCOUNTER — Telehealth: Payer: Self-pay

## 2014-02-15 ENCOUNTER — Other Ambulatory Visit (INDEPENDENT_AMBULATORY_CARE_PROVIDER_SITE_OTHER): Payer: BC Managed Care – PPO

## 2014-02-15 DIAGNOSIS — E785 Hyperlipidemia, unspecified: Secondary | ICD-10-CM

## 2014-02-15 DIAGNOSIS — E7800 Pure hypercholesterolemia, unspecified: Secondary | ICD-10-CM

## 2014-02-15 DIAGNOSIS — R7989 Other specified abnormal findings of blood chemistry: Secondary | ICD-10-CM

## 2014-02-15 LAB — LIPID PANEL
CHOL/HDL RATIO: 5
Cholesterol: 277 mg/dL — ABNORMAL HIGH (ref 0–200)
HDL: 59.8 mg/dL (ref >39.00)
LDL CALC: 193 mg/dL — AB (ref 0–99)
NonHDL: 217.2
Triglycerides: 119 mg/dL (ref 0.0–149.0)
VLDL: 23.8 mg/dL (ref 0.0–40.0)

## 2014-02-15 LAB — TSH: TSH: 1.01 u[IU]/mL (ref 0.35–4.50)

## 2014-02-15 MED ORDER — SIMVASTATIN 20 MG PO TABS: 20.0000 mg | ORAL_TABLET | Freq: Every day | ORAL | Status: DC

## 2014-02-15 NOTE — Telephone Encounter (Signed)
Message copied by Santiago Bumpers on Mon Feb 15, 2014  4:49 PM ------      Message from: Roxy Cedar B      Created: Mon Feb 15, 2014  4:18 PM       Cholesterol is worsening. Needs medication. Will you consider cholesterol med? Call in Simvastatin if ok ------

## 2014-02-19 ENCOUNTER — Ambulatory Visit (INDEPENDENT_AMBULATORY_CARE_PROVIDER_SITE_OTHER): Payer: BC Managed Care – PPO | Admitting: Family

## 2014-02-19 ENCOUNTER — Encounter: Payer: Self-pay | Admitting: Family

## 2014-02-19 VITALS — BP 122/74 | HR 109 | Temp 98.8°F | Ht 60.0 in | Wt 157.0 lb

## 2014-02-19 DIAGNOSIS — J069 Acute upper respiratory infection, unspecified: Secondary | ICD-10-CM

## 2014-02-19 DIAGNOSIS — L2089 Other atopic dermatitis: Secondary | ICD-10-CM

## 2014-02-19 DIAGNOSIS — L209 Atopic dermatitis, unspecified: Secondary | ICD-10-CM

## 2014-02-19 MED ORDER — METHYLPREDNISOLONE ACETATE 40 MG/ML IJ SUSP: 80.0000 mg | Freq: Once | INTRAMUSCULAR | Status: DC

## 2014-02-19 MED ORDER — PREDNISONE 20 MG PO TABS: 20.0000 mg | ORAL_TABLET | Freq: Every day | ORAL | Status: AC

## 2014-02-19 MED ORDER — METHYLPREDNISOLONE ACETATE 80 MG/ML IJ SUSP
80.0000 mg | Freq: Once | INTRAMUSCULAR | Status: AC
  Administered 2014-02-19: 80 mg via INTRAMUSCULAR

## 2014-02-19 NOTE — Progress Notes (Signed)
Subjective:    Patient ID: Danielle Bond, female    DOB: February 01, 1961, 53 y.o.   MRN: [id number]  HPI  53 year old right torso has been present for several weeks but did improve with the use of triamcinolone and Eucerin cream. The rash completely went away and is beginning to return. Denies any changes in detergents, soaps, lotions. Has a history of bilateral knee and hepatitis. No new medications.  Patient also has concerns of cough and congestion that started today. Cough is productive with yellow to clear phlegm. Denies any fever or chills. Has not taken any medication for relief.  Review of Systems  Constitutional: Negative.   HENT: Positive for congestion and postnasal drip. Negative for sinus pressure, sneezing and sore throat.   Respiratory: Positive for cough. Negative for shortness of breath and wheezing.   Cardiovascular: Negative.   Gastrointestinal: Negative.   Endocrine: Positive for cold intolerance.  Musculoskeletal: Negative.   Skin: Positive for rash.       Right chest  Allergic/Immunologic: Negative.   Neurological: Negative.   Psychiatric/Behavioral: Negative.    Past Medical History  Diagnosis Date  . Asthma   . Mitral valve prolapse   . Chronic kidney disease   . Migraine   . Orthostatic hypotension   . Ulcerative colitis 03/31/2012  . Kidney stone   . Kidney stone     History   Social History  . Marital Status: Married    Spouse Name: N/A    Number of Children: N/A  . Years of Education: N/A   Occupational History  . Not on file.   Social History Main Topics  . Smoking status: Never Smoker   . Smokeless tobacco: Not on file  . Alcohol Use: No  . Drug Use: No  . Sexual Activity: Yes   Other Topics Concern  . Not on file   Social History Narrative  . No narrative on file    Past Surgical History  Procedure Laterality Date  . Cesarean section    . Thyroidectomy    . Cholecystectomy    . Colonoscopy      Family History  Problem  Relation Age of Onset  . Cancer Paternal Aunt   . Heart disease Paternal Grandmother     Allergies  Allergen Reactions  . Almond Oil Hives and Shortness Of Breath  . Other Anaphylaxis    Almonds, tree nuts  . Aspirin Hives and Nausea Only    Stomach cramps  . Penicillins Hives and Swelling    Stomach cramps  . Acetaminophen     Has autoimmune hepatitis, wants to avoid APAP  . Corticosteroids     Had acute pancreatitis x 2 after steroid use.  . Imitrex [Sumatriptan] Swelling    Injection caused swelling  . Stadol [Butorphanol]     hallucinates  . Vancomycin Hives  . Pepcid [Famotidine] Nausea And Vomiting    Makes acid worse  . Prednisone Nausea And Vomiting    All steroids causes nausea and pancreatitis     Current Outpatient Prescriptions on File Prior to Visit  Medication Sig Dispense Refill  . albuterol (PROVENTIL HFA;VENTOLIN HFA) 108 (90 BASE) MCG/ACT inhaler Inhale 2 puffs into the lungs every 6 (six) hours as needed for wheezing.       Marland Kitchen azaTHIOprine (IMURAN) 50 MG tablet Take 100 mg by mouth daily.      . cholecalciferol (VITAMIN D) 1000 UNITS tablet Take 2,000 Units by mouth daily.       Marland Kitchen  levothyroxine (SYNTHROID, LEVOTHROID) 112 MCG tablet Take 112 mcg by mouth daily before breakfast.      . simvastatin (ZOCOR) 20 MG tablet Take 1 tablet (20 mg total) by mouth at bedtime.  90 tablet  0  . triamcinolone (KENALOG) 0.025 % ointment Apply 1 application topically 2 (two) times daily.  30 g  0  . Triamcinolone Acetonide (TRIAMCINOLONE 0.1 % CREAM : EUCERIN) CREA Apply 1 application topically 2 (two) times daily.  1 each  0   No current facility-administered medications on file prior to visit.    BP 122/74  Pulse 109  Temp(Src) 98.8 F (37.1 C) (Oral)  Ht 5' (1.524 m)  Wt 157 lb (71.215 kg)  BMI 30.66 kg/m2  SpO2 98%  LMP 08/12/2014chart     Objective:   Physical Exam  Constitutional: She is oriented to person, place, and time. She appears well-developed and  well-nourished.  HENT:  Right Ear: External ear normal.  Left Ear: External ear normal.  Nose: Nose normal.  Mouth/Throat: Oropharynx is clear and moist.  Neck: Normal range of motion. Neck supple.  Cardiovascular: Normal rate, regular rhythm and normal heart sounds.   Pulmonary/Chest: Effort normal and breath sounds normal. She has no wheezes.  Abdominal: Soft. Bowel sounds are normal.  Neurological: She is alert and oriented to person, place, and time.  Skin: Skin is warm and dry.  Right chest rash, urticarial, dry and flaky.   Psychiatric: She has a normal mood and affect.          Assessment & Plan:  Srah was seen today for cough and rash.  Diagnoses and associated orders for this visit:  Atopic dermatitis - methylPREDNISolone acetate (DEPO-MEDROL) injection 80 mg; Inject 2 mLs (80 mg total) into the muscle once. - methylPREDNISolone acetate (DEPO-MEDROL) injection 80 mg; Inject 1 mL (80 mg total) into the muscle once.  Acute upper respiratory infections of unspecified site  Other Orders - predniSONE (DELTASONE) 20 MG tablet; Take 1 tablet (20 mg total) by mouth daily with breakfast.    Call office with any questions or concerns. Delsym over-the-counter as needed for cough. Prescription provided for prednisone to take if she needs it. Low doses of prednisone have been tolerable for her.

## 2014-02-19 NOTE — Patient Instructions (Signed)
Upper Respiratory Infection, Adult An upper respiratory infection (URI) is also sometimes known as the common cold. The upper respiratory tract includes the nose, sinuses, throat, trachea, and bronchi. Bronchi are the airways leading to the lungs. Most people improve within 1 week, but symptoms can last up to 2 weeks. A residual cough may last even longer.  CAUSES Many different viruses can infect the tissues lining the upper respiratory tract. The tissues become irritated and inflamed and often become very moist. Mucus production is also common. A cold is contagious. You can easily spread the virus to others by oral contact. This includes kissing, sharing a glass, coughing, or sneezing. Touching your mouth or nose and then touching a surface, which is then touched by another person, can also spread the virus. SYMPTOMS  Symptoms typically develop 1 to 3 days after you come in contact with a cold virus. Symptoms vary from person to person. They may include:  Runny nose.  Sneezing.  Nasal congestion.  Sinus irritation.  Sore throat.  Loss of voice (laryngitis).  Cough.  Fatigue.  Muscle aches.  Loss of appetite.  Headache.  Low-grade fever. DIAGNOSIS  You might diagnose your own cold based on familiar symptoms, since most people get a cold 2 to 3 times a year. Your caregiver can confirm this based on your exam. Most importantly, your caregiver can check that your symptoms are not due to another disease such as strep throat, sinusitis, pneumonia, asthma, or epiglottitis. Blood tests, throat tests, and X-rays are not necessary to diagnose a common cold, but they may sometimes be helpful in excluding other more serious diseases. Your caregiver will decide if any further tests are required. RISKS AND COMPLICATIONS  You may be at risk for a more severe case of the common cold if you smoke cigarettes, have chronic heart disease (such as heart failure) or lung disease (such as asthma), or if  you have a weakened immune system. The very young and very old are also at risk for more serious infections. Bacterial sinusitis, middle ear infections, and bacterial pneumonia can complicate the common cold. The common cold can worsen asthma and chronic obstructive pulmonary disease (COPD). Sometimes, these complications can require emergency medical care and may be life-threatening. PREVENTION  The best way to protect against getting a cold is to practice good hygiene. Avoid oral or hand contact with people with cold symptoms. Wash your hands often if contact occurs. There is no clear evidence that vitamin C, vitamin E, echinacea, or exercise reduces the chance of developing a cold. However, it is always recommended to get plenty of rest and practice good nutrition. TREATMENT  Treatment is directed at relieving symptoms. There is no cure. Antibiotics are not effective, because the infection is caused by a virus, not by bacteria. Treatment may include:  Increased fluid intake. Sports drinks offer valuable electrolytes, sugars, and fluids.  Breathing heated mist or steam (vaporizer or shower).  Eating chicken soup or other clear broths, and maintaining good nutrition.  Getting plenty of rest.  Using gargles or lozenges for comfort.  Controlling fevers with ibuprofen or acetaminophen as directed by your caregiver.  Increasing usage of your inhaler if you have asthma. Zinc gel and zinc lozenges, taken in the first 24 hours of the common cold, can shorten the duration and lessen the severity of symptoms. Pain medicines may help with fever, muscle aches, and throat pain. A variety of non-prescription medicines are available to treat congestion and runny nose. Your caregiver   can make recommendations and may suggest nasal or lung inhalers for other symptoms.  HOME CARE INSTRUCTIONS   Only take over-the-counter or prescription medicines for pain, discomfort, or fever as directed by your  caregiver.  Use a warm mist humidifier or inhale steam from a shower to increase air moisture. This may keep secretions moist and make it easier to breathe.  Drink enough water and fluids to keep your urine clear or pale yellow.  Rest as needed.  Return to work when your temperature has returned to normal or as your caregiver advises. You may need to stay home longer to avoid infecting others. You can also use a face mask and careful hand washing to prevent spread of the virus. SEEK MEDICAL CARE IF:   After the first few days, you feel you are getting worse rather than better.  You need your caregiver's advice about medicines to control symptoms.  You develop chills, worsening shortness of breath, or brown or red sputum. These may be signs of pneumonia.  You develop yellow or brown nasal discharge or pain in the face, especially when you bend forward. These may be signs of sinusitis.  You develop a fever, swollen neck glands, pain with swallowing, or white areas in the back of your throat. These may be signs of strep throat. SEEK IMMEDIATE MEDICAL CARE IF:   You have a fever.  You develop severe or persistent headache, ear pain, sinus pain, or chest pain.  You develop wheezing, a prolonged cough, cough up blood, or have a change in your usual mucus (if you have chronic lung disease).  You develop sore muscles or a stiff neck. Document Released: 01/02/2001 Document Revised: 10/01/2011 Document Reviewed: 10/14/2013 [employer] Patient Information 2015 [employer]. This information is not intended to replace advice given to you by your health care provider. Make sure you discuss any questions you have with your health care provider.  

## 2014-02-19 NOTE — Progress Notes (Signed)
Pre visit review using our clinic review tool, if applicable. No additional management support is needed unless otherwise documented below in the visit note. 

## 2014-03-08 ENCOUNTER — Encounter: Payer: Self-pay | Admitting: Family

## 2014-03-10 ENCOUNTER — Emergency Department (HOSPITAL_COMMUNITY)
Admission: EM | Admit: 2014-03-10 | Discharge: 2014-03-10 | Disposition: A | Discharge status: Home / Self Care | Payer: BC Managed Care – PPO | Attending: Emergency Medicine | Admitting: Emergency Medicine

## 2014-03-10 ENCOUNTER — Encounter (HOSPITAL_COMMUNITY): Payer: Self-pay | Admitting: Emergency Medicine

## 2014-03-10 ENCOUNTER — Emergency Department (HOSPITAL_COMMUNITY): Payer: BC Managed Care – PPO

## 2014-03-10 ENCOUNTER — Other Ambulatory Visit: Payer: Self-pay

## 2014-03-10 DIAGNOSIS — Z8679 Personal history of other diseases of the circulatory system: Secondary | ICD-10-CM | POA: Insufficient documentation

## 2014-03-10 DIAGNOSIS — N189 Chronic kidney disease, unspecified: Secondary | ICD-10-CM | POA: Insufficient documentation

## 2014-03-10 DIAGNOSIS — K5900 Constipation, unspecified: Secondary | ICD-10-CM | POA: Insufficient documentation

## 2014-03-10 DIAGNOSIS — Z880 Allergy status to penicillin: Secondary | ICD-10-CM | POA: Diagnosis not present

## 2014-03-10 DIAGNOSIS — Z8669 Personal history of other diseases of the nervous system and sense organs: Secondary | ICD-10-CM | POA: Insufficient documentation

## 2014-03-10 DIAGNOSIS — K5190 Ulcerative colitis, unspecified, without complications: Secondary | ICD-10-CM | POA: Diagnosis not present

## 2014-03-10 DIAGNOSIS — Z79899 Other long term (current) drug therapy: Secondary | ICD-10-CM | POA: Insufficient documentation

## 2014-03-10 DIAGNOSIS — IMO0002 Reserved for concepts with insufficient information to code with codable children: Secondary | ICD-10-CM | POA: Insufficient documentation

## 2014-03-10 DIAGNOSIS — R21 Rash and other nonspecific skin eruption: Secondary | ICD-10-CM | POA: Insufficient documentation

## 2014-03-10 DIAGNOSIS — N12 Tubulo-interstitial nephritis, not specified as acute or chronic: Secondary | ICD-10-CM | POA: Diagnosis not present

## 2014-03-10 DIAGNOSIS — Z87442 Personal history of urinary calculi: Secondary | ICD-10-CM | POA: Diagnosis not present

## 2014-03-10 DIAGNOSIS — J45909 Unspecified asthma, uncomplicated: Secondary | ICD-10-CM | POA: Diagnosis not present

## 2014-03-10 DIAGNOSIS — R110 Nausea: Secondary | ICD-10-CM | POA: Insufficient documentation

## 2014-03-10 LAB — URINALYSIS, ROUTINE W REFLEX MICROSCOPIC
Bilirubin Urine: NEGATIVE
GLUCOSE, UA: NEGATIVE mg/dL
Hgb urine dipstick: NEGATIVE
KETONES UR: 15 mg/dL — AB
Nitrite: NEGATIVE
Protein, ur: NEGATIVE mg/dL
SPECIFIC GRAVITY, URINE: 1.026 (ref 1.005–1.030)
Urobilinogen, UA: 0.2 mg/dL (ref 0.0–1.0)
pH: 5.5 (ref 5.0–8.0)

## 2014-03-10 LAB — URINE MICROSCOPIC-ADD ON

## 2014-03-10 LAB — COMPREHENSIVE METABOLIC PANEL
ALBUMIN: 4.4 g/dL (ref 3.5–5.2)
ALT: 17 U/L (ref 0–35)
AST: 27 U/L (ref 0–37)
Alkaline Phosphatase: 75 U/L (ref 39–117)
Anion gap: 12 (ref 5–15)
BUN: 15 mg/dL (ref 6–23)
CHLORIDE: 103 meq/L (ref 96–112)
CO2: 26 meq/L (ref 19–32)
Calcium: 9.7 mg/dL (ref 8.4–10.5)
Creatinine, Ser: 0.75 mg/dL (ref 0.50–1.10)
GFR calc Af Amer: >90 mL/min (ref >90)
Glucose, Bld: 115 mg/dL — ABNORMAL HIGH (ref 70–99)
Potassium: 4.2 mEq/L (ref 3.7–5.3)
SODIUM: 141 meq/L (ref 137–147)
Total Bilirubin: 0.4 mg/dL (ref 0.3–1.2)
Total Protein: 8.6 g/dL — ABNORMAL HIGH (ref 6.0–8.3)

## 2014-03-10 LAB — CBC WITH DIFFERENTIAL/PLATELET
BASOS ABS: 0 10*3/uL (ref 0.0–0.1)
BASOS PCT: 1 % (ref 0–1)
Eosinophils Absolute: 0.4 10*3/uL (ref 0.0–0.7)
Eosinophils Relative: 7 % — ABNORMAL HIGH (ref 0–5)
HCT: 38 % (ref 36.0–46.0)
Hemoglobin: 12.7 g/dL (ref 12.0–15.0)
LYMPHS PCT: 27 % (ref 12–46)
Lymphs Abs: 1.6 10*3/uL (ref 0.7–4.0)
MCH: 29.6 pg (ref 26.0–34.0)
MCHC: 33.4 g/dL (ref 30.0–36.0)
MCV: 88.6 fL (ref 78.0–100.0)
Monocytes Absolute: 0.6 10*3/uL (ref 0.1–1.0)
Monocytes Relative: 10 % (ref 3–12)
NEUTROS ABS: 3.2 10*3/uL (ref 1.7–7.7)
Neutrophils Relative %: 55 % (ref 43–77)
Platelets: 289 10*3/uL (ref 150–400)
RBC: 4.29 MIL/uL (ref 3.87–5.11)
RDW: 12.7 % (ref 11.5–15.5)
WBC: 5.8 10*3/uL (ref 4.0–10.5)

## 2014-03-10 LAB — LIPASE, BLOOD: Lipase: 52 U/L (ref 11–59)

## 2014-03-10 MED ORDER — DEXAMETHASONE SODIUM PHOSPHATE 4 MG/ML IJ SOLN
4.0000 mg | Freq: Once | INTRAMUSCULAR | Status: AC
  Administered 2014-03-10: 4 mg via INTRAMUSCULAR
  Filled 2014-03-10: qty 1

## 2014-03-10 MED ORDER — HYDROXYZINE HCL 25 MG PO TABS: 25.0000 mg | ORAL_TABLET | Freq: Four times a day (QID) | ORAL | Status: DC

## 2014-03-10 MED ORDER — HYDROXYZINE HCL 25 MG PO TABS
25.0000 mg | ORAL_TABLET | Freq: Once | ORAL | Status: AC
  Administered 2014-03-10: 25 mg via ORAL
  Filled 2014-03-10: qty 1

## 2014-03-10 MED ORDER — PREDNISONE 20 MG PO TABS: 40.0000 mg | ORAL_TABLET | Freq: Every day | ORAL | Status: DC

## 2014-03-10 MED ORDER — CEPHALEXIN 500 MG PO CAPS
500.0000 mg | ORAL_CAPSULE | Freq: Once | ORAL | Status: AC
  Administered 2014-03-10: 500 mg via ORAL
  Filled 2014-03-10: qty 1

## 2014-03-10 MED ORDER — CEPHALEXIN 500 MG PO CAPS: 500.0000 mg | ORAL_CAPSULE | Freq: Four times a day (QID) | ORAL | Status: DC

## 2014-03-10 NOTE — Discharge Instructions (Signed)
Prednisone and hydroxizine for rash. Keflex for urinary tract/kidney infection. Follow up with your doctor as soon as able.   Pyelonephritis, Adult Pyelonephritis is a kidney infection. In general, there are 2 main types of pyelonephritis:  Infections that come on quickly without any warning (acute pyelonephritis).  Infections that persist for a long period of time (chronic pyelonephritis). CAUSES  Two main causes of pyelonephritis are:  Bacteria traveling from the bladder to the kidney. This is a problem especially in pregnant women. The urine in the bladder can become filled with bacteria from multiple causes, including:  Inflammation of the prostate gland (prostatitis).  Sexual intercourse in females.  Bladder infection (cystitis).  Bacteria traveling from the bloodstream to the tissue part of the kidney. Problems that may increase your risk of getting a kidney infection include:  Diabetes.  Kidney stones or bladder stones.  Cancer.  Catheters placed in the bladder.  Other abnormalities of the kidney or ureter. SYMPTOMS   Abdominal pain.  Pain in the side or flank area.  Fever.  Chills.  Upset stomach.  Blood in the urine (dark urine).  Frequent urination.  Strong or persistent urge to urinate.  Burning or stinging when urinating. DIAGNOSIS  Your caregiver may diagnose your kidney infection based on your symptoms. A urine sample may also be taken. TREATMENT  In general, treatment depends on how severe the infection is.   If the infection is mild and caught early, your caregiver may treat you with oral antibiotics and send you home.  If the infection is more severe, the bacteria may have gotten into the bloodstream. This will require intravenous (IV) antibiotics and a hospital stay. Symptoms may include:  High fever.  Severe flank pain.  Shaking chills.  Even after a hospital stay, your caregiver may require you to be on oral antibiotics for a period  of time.  Other treatments may be required depending upon the cause of the infection. HOME CARE INSTRUCTIONS   Take your antibiotics as directed. Finish them even if you start to feel better.  Make an appointment to have your urine checked to make sure the infection is gone.  Drink enough fluids to keep your urine clear or pale yellow.  Take medicines for the bladder if you have urgency and frequency of urination as directed by your caregiver. SEEK IMMEDIATE MEDICAL CARE IF:   You have a fever or persistent symptoms for more than 2-3 days.  You have a fever and your symptoms suddenly get worse.  You are unable to take your antibiotics or fluids.  You develop shaking chills.  You experience extreme weakness or fainting.  There is no improvement after 2 days of treatment. MAKE SURE YOU:  Understand these instructions.  Will watch your condition.  Will get help right away if you are not doing well or get worse. Document Released: 07/09/2005 Document Revised: 01/08/2012 Document Reviewed: 12/13/2010 [facility] Patient Information 2015 [place], [place]. This information is not intended to replace advice given to you by your health care provider. Make sure you discuss any questions you have with your health care provider.

## 2014-03-10 NOTE — ED Notes (Signed)
Per pt, seen in July with rash and fever.  Told to see dermatologist.  Biopsy was done and pt placed on prednisone.  Coming back today with same which started 2 days ago.

## 2014-03-10 NOTE — ED Provider Notes (Signed)
CSN: [id number]     Arrival date & time 03/10/14  1621 History  This chart was scribed for non-physician practitioner, Jeannett Senior, PA-C working with Arbie Cookey, MD by Frederich Balding, ED scribe. This patient was seen in room WTR7/WTR7 and the patient's care was started at 5:36 PM.    Chief Complaint  Patient presents with  . Rash   The history is provided by the patient. No language interpreter was used.   HPI Comments: Danielle Bond is a 53 y.o. female who presents to the Emergency Department complaining of an itching, burning rash with associated fever that started 2 days ago. It started on her right breast and has spread to her abdomen and right arm since then. She took motrin prior to arrival with relief of fever. Pt states she had the same symptoms in July 2015 and was referred to a dermatologist. States a biopsy was also done and she was told to use hydrocortisone cream. The cream didn't work and states biopsy was negative. She was then placed on prednisone. It provided relief for one week then symptoms started again. She states the biopsy site still has not healed by Dr. Allyson Sabal told her to use topical antibiotics. Denies new soaps, lotions, detergents. Pt has been checked for lupus.   Past Medical History  Diagnosis Date  . Asthma   . Mitral valve prolapse   . Chronic kidney disease   . Migraine   . Orthostatic hypotension   . Ulcerative colitis 03/31/2012  . Kidney stone   . Kidney stone    Past Surgical History  Procedure Laterality Date  . Cesarean section    . Thyroidectomy    . Cholecystectomy    . Colonoscopy     Family History  Problem Relation Age of Onset  . Cancer Paternal Aunt   . Heart disease Paternal Grandmother    History  Substance Use Topics  . Smoking status: Never Smoker   . Smokeless tobacco: Not on file  . Alcohol Use: No   OB History   Grav Para Term Preterm Abortions TAB SAB Ect Mult Living   2    2  2   2      Review of  Systems  Constitutional: Positive for fever.  Gastrointestinal: Positive for nausea, abdominal pain and constipation. Negative for blood in stool.  Genitourinary: Positive for dysuria, frequency and flank pain. Negative for pelvic pain.  Musculoskeletal: Negative for arthralgias and myalgias.  Skin: Positive for rash.  All other systems reviewed and are negative.  Allergies  Almond oil; Other; Aspirin; Penicillins; Acetaminophen; Corticosteroids; Imitrex; Stadol; Vancomycin; Pepcid; and Prednisone  Home Medications   Prior to Admission medications   Medication Sig Start Date End Date Taking? Authorizing Provider  albuterol (PROVENTIL HFA;VENTOLIN HFA) 108 (90 BASE) MCG/ACT inhaler Inhale 2 puffs into the lungs every 6 (six) hours as needed for wheezing.     Historical Provider, MD  azaTHIOprine (IMURAN) 50 MG tablet Take 100 mg by mouth daily.    Historical Provider, MD  cholecalciferol (VITAMIN D) 1000 UNITS tablet Take 2,000 Units by mouth daily.     Historical Provider, MD  levothyroxine (SYNTHROID, LEVOTHROID) 112 MCG tablet Take 112 mcg by mouth daily before breakfast.    Historical Provider, MD  simvastatin (ZOCOR) 20 MG tablet Take 1 tablet (20 mg total) by mouth at bedtime. 02/15/14   Timoteo Gaul, FNP  triamcinolone (KENALOG) 0.025 % ointment Apply 1 application topically 2 (two) times daily. 01/16/14  Nicole Pisciotta, PA-C  Triamcinolone Acetonide (TRIAMCINOLONE 0.1 % CREAM : EUCERIN) CREA Apply 1 application topically 2 (two) times daily. 01/28/14   Timoteo Gaul, FNP   BP 139/85  Pulse 81  Temp(Src) 98.4 F (36.9 C) (Oral)  Resp 16  SpO2 99%  LMP 03/03/2013  Physical Exam  Nursing note and vitals reviewed. Constitutional: She is oriented to person, place, and time. She appears well-developed and well-nourished. No distress.  HENT:  Head: Normocephalic and atraumatic.  Eyes: Conjunctivae and EOM are normal.  Neck: Neck supple. No tracheal deviation present.   Cardiovascular: Normal rate and normal heart sounds.   Pulmonary/Chest: Effort normal and breath sounds normal. No respiratory distress.  Abdominal: Soft. Bowel sounds are normal. She exhibits no distension. There is tenderness. There is no rebound and no guarding.  Right CVA tenderness. RUQ tenderness  Musculoskeletal: Normal range of motion.  Neurological: She is alert and oriented to person, place, and time.  Skin: Skin is warm and dry.  Fine erythematous ras to the right breast, right ribs, right upper abdomen. No vesicles, no pustules. No surrounding cellulitis.   Psychiatric: She has a normal mood and affect. Her behavior is normal.    ED Course  Procedures (including critical care time)  DIAGNOSTIC STUDIES: Oxygen Saturation is 99% on RA, normal by my interpretation.    COORDINATION OF CARE: 5:47 PM-Discussed treatment plan which includes prednisone with pt at bedside and pt agreed to plan.   Labs Review Labs Reviewed  CBC WITH DIFFERENTIAL  COMPREHENSIVE METABOLIC PANEL  LIPASE, BLOOD  URINALYSIS, ROUTINE W REFLEX MICROSCOPIC    Imaging Review Dg Abd 2 Views  03/10/2014   CLINICAL DATA:  Back pain.  Low-grade fever.  EXAM: ABDOMEN - 2 VIEW  COMPARISON:  None.  FINDINGS: Insert upper riding supine abdomen shows no gaseous small bowel dilatation to suggest obstruction. Small to moderate stool volume throughout. Visualized bony structures are unremarkable. No unexpected abdominal pelvic calcification.  IMPRESSION: No evidence for intraperitoneal free air or bowel obstruction.   Electronically Signed   By: Misty Stanley M.D.   On: 03/10/2014 18:50     EKG Interpretation None      MDM   Final diagnoses:  Pyelonephritis  Rash    Pt initially presented with rash, but also complaining of constipation for 4 days, right flank pain, urinary frequency. Will get labs, UA, rash treat with prednisone and vistaril.    8:49 PM Labs all normal. Based on hx and no elevated  WBC, doubt acute colitis. Most likely mild constipation. Pt's ua appears to be infected. With right flank pain and CVA tenderness suspect mild pyelonephritis. She is afebrile, non toxic appearing, no vomiting. Home with keflex and follow up with her pcp and specialists at [facility]. Pt agrees to the plan.   Filed Vitals:   03/10/14 1633 03/10/14 2043  BP: 139/85 123/83  Pulse: 81 76  Temp: 98.4 F (36.9 C)   TempSrc: Oral   Resp: 16 20  SpO2: 99% 100%     I personally performed the services described in this documentation, which was scribed in my presence. The recorded information has been reviewed and is accurate.  Renold Genta, PA-C 03/10/14 2050

## 2014-03-11 ENCOUNTER — Telehealth (HOSPITAL_BASED_OUTPATIENT_CLINIC_OR_DEPARTMENT_OTHER): Payer: Self-pay | Admitting: Emergency Medicine

## 2014-03-12 LAB — URINE CULTURE: Colony Count: 30000

## 2014-03-14 NOTE — ED Provider Notes (Signed)
Medical screening examination/treatment/procedure(s) were performed by non-physician practitioner and as supervising physician I was immediately available for consultation/collaboration.   EKG Interpretation None        Houston Siren III, MD 03/14/14 [phone]

## 2014-03-16 ENCOUNTER — Other Ambulatory Visit (INDEPENDENT_AMBULATORY_CARE_PROVIDER_SITE_OTHER): Payer: BC Managed Care – PPO

## 2014-03-16 DIAGNOSIS — M359 Systemic involvement of connective tissue, unspecified: Secondary | ICD-10-CM

## 2014-03-17 LAB — CBC WITH DIFFERENTIAL/PLATELET
BASOS PCT: 0.6 % (ref 0.0–3.0)
Basophils Absolute: 0 10*3/uL (ref 0.0–0.1)
EOS PCT: 4.1 % (ref 0.0–5.0)
Eosinophils Absolute: 0.2 10*3/uL (ref 0.0–0.7)
HCT: 38 % (ref 36.0–46.0)
Hemoglobin: 12.8 g/dL (ref 12.0–15.0)
LYMPHS PCT: 20.4 % (ref 12.0–46.0)
Lymphs Abs: 1.2 10*3/uL (ref 0.7–4.0)
MCHC: 33.7 g/dL (ref 30.0–36.0)
MCV: 88.9 fl (ref 78.0–100.0)
MONO ABS: 0.5 10*3/uL (ref 0.1–1.0)
Monocytes Relative: 8.9 % (ref 3.0–12.0)
NEUTROS PCT: 66 % (ref 43.0–77.0)
Neutro Abs: 3.9 10*3/uL (ref 1.4–7.7)
PLATELETS: 299 10*3/uL (ref 150.0–400.0)
RBC: 4.27 Mil/uL (ref 3.87–5.11)
RDW: 13.9 % (ref 11.5–15.5)
WBC: 6 10*3/uL (ref 4.0–10.5)

## 2014-03-17 LAB — ANTI-DNA ANTIBODY, DOUBLE-STRANDED: DS DNA AB: 1 [IU]/mL

## 2014-03-17 LAB — AST: AST: 27 U/L (ref 0–37)

## 2014-03-17 LAB — CREATININE, SERUM: CREATININE: 0.8 mg/dL (ref 0.4–1.2)

## 2014-03-17 LAB — SEDIMENTATION RATE: SED RATE: 25 mm/h — AB (ref 0–22)

## 2014-03-17 LAB — ALT: ALT: 17 U/L (ref 0–35)

## 2014-03-17 LAB — CK: CK TOTAL: 185 U/L — AB (ref 7–177)

## 2014-03-17 LAB — C3 AND C4
C3 COMPLEMENT: 116 mg/dL (ref 90–180)
C4 COMPLEMENT: 18 mg/dL (ref 10–40)

## 2014-03-17 LAB — HIGH SENSITIVITY CRP: CRP, High Sensitivity: 1.63 mg/L (ref 0.000–5.000)

## 2014-03-19 ENCOUNTER — Telehealth: Payer: Self-pay | Admitting: Gastroenterology

## 2014-03-19 MED ORDER — PREDNISONE 20 MG PO TABS: 40.0000 mg | ORAL_TABLET | Freq: Every day | ORAL | Status: DC

## 2014-03-19 NOTE — Telephone Encounter (Signed)
She called about rash.  It was getting better but then worsened after she restarted imuran.  I told her to stop the imuran.  I've called her in script for pred 16m twice daily for next 3 days.  She knows to call Dr. MCollene Mareson Monday about alternatives to imuran for her AIH.

## 2014-03-22 ENCOUNTER — Other Ambulatory Visit: Payer: Self-pay

## 2014-03-22 ENCOUNTER — Telehealth: Payer: Self-pay

## 2014-03-22 ENCOUNTER — Ambulatory Visit: Payer: BC Managed Care – PPO | Admitting: Family

## 2014-03-22 ENCOUNTER — Encounter: Payer: BC Managed Care – PPO | Admitting: Family Medicine

## 2014-03-22 DIAGNOSIS — R300 Dysuria: Secondary | ICD-10-CM

## 2014-03-22 LAB — POCT URINALYSIS DIPSTICK
Bilirubin, UA: NEGATIVE
Blood, UA: NEGATIVE
Glucose, UA: NEGATIVE
Nitrite, UA: NEGATIVE
Protein, UA: NEGATIVE
Spec Grav, UA: 1.015
Urobilinogen, UA: 0.2
pH, UA: 5.5

## 2014-03-22 NOTE — Telephone Encounter (Signed)
Attempted to cal lpt several times on Friday and this morning. Spoke with pt's husband to advise that we need to reschedule pt's appointment for this afternoon due to [facility] having coumadin clinic and I have to leave at noon. Husband, Aaron Edelman, states that pt has been trying to get an appt for weeks and wants to know what pt is suppose to do while she's suffering with the rash. Advised husband that I disagree that pt has been trying to get an appt for weeks. In an email correspondence I advised the pt that our latest appointment slot is 4:15pm, which is generally slotted for NP or CPE. Pt says that she can only come at that time because she just started a new job and can't get off work early. Husband was upset because Padonda's schedule is accommodating the need of the pt. I apologized for the inconvenience and advised that pt is welcome to see another provider but that she will be unable to see Padonda this week. Aaron Edelman states he will send pt a message to advise that we need to reschedule he appointment  Note was mistakenly documented in a note from ER on 03/11/14

## 2014-03-22 NOTE — Telephone Encounter (Signed)
Attempted to cal lpt several times on Friday and this morning. Spoke with pt's husband to advise that we need to reschedule pt's appointment for this afternoon due to [facility] having coumadin clinic and I have to leave at noon. Husband, Aaron Edelman, states that pt has been trying to get an appt for weeks and wants to know what pt is suppose to do while she's suffering with the rash. Advised husband that I disagree that pt has been trying to get an appt for weeks. In an email correspondence I advised the pt that our latest appointment slot is 4:15pm, which is generally slotted for NP or CPE. Pt says that she can only come at that time because she just started a new job and can't get off work early. Husband was upset because Padonda's schedule is accommodating the need of the pt. I apologized for the inconvenience and advised that pt is welcome to see another provider but that she will be unable to see Padonda this week. Aaron Edelman states he will send pt a message to advise that we need to reschedule he appointment

## 2014-03-24 ENCOUNTER — Telehealth: Payer: Self-pay | Admitting: Family

## 2014-03-24 LAB — URINE CULTURE
Colony Count: NO GROWTH
ORGANISM ID, BACTERIA: NO GROWTH

## 2014-03-24 NOTE — Telephone Encounter (Signed)
Voicemail received from patient inquiring about the following:  1.  Results of urine culture as she is still on antibiotics and not feeling well.  2. Who is monitoring her prednisone intake.  Pt states she previously was not weaned off of prednisone appropriately and her body went in to shock.  Wants to make this is being monitored.  Pt states her [facility] specialist can only handle so much, that her PCP has play a role in her care as well, please advise.  Please forward response back to me so that I can follow up with patient on the above requests.

## 2014-03-24 NOTE — Telephone Encounter (Signed)
1. Urine culture is pending. Will notify her pending results.  2. Prednisone is excreted in the urine. No lab to monitor intake. However, we use therapeutic dosing.  3. Dermatology is the specialist for skin. Once you have been referred, technically, the specialist has complete control over that disease process and management. We, primary care have been interfering. We should not continue to do that. Anything outside of dermatology issue and autoimmune issue, we will/can handle. It is unsafe for multiple people to be treating the same condition.

## 2014-03-25 NOTE — Telephone Encounter (Signed)
I want to be able to talk to them and not be rushed. If we double book, time will be limited. I suggest they schedule a 30 min slot that is available.

## 2014-03-25 NOTE — Telephone Encounter (Signed)
Pt husband informed to keep appt scheduled for 9/10 at 4:15 so Padonda has sufficient time, instead of double booking.  Mr. Holtrop agreed and stated that was fine.

## 2014-03-25 NOTE — Telephone Encounter (Signed)
Attempted to reach pt x2, unable to reach pt left message to call back.  Pt had husband to return call, I spoke with husband who is listed on DPR and gave him the information from [place].  Pt's husband requested to schedule appt with PCP on next Tuesday, 9/8, was offered a 2:30 appt, however, declined due to pt unable to come in before 4pm due to work schedule.  Pt was scheduled with PCP on 9/10 at 4:15, however, husband inquiring if pt can be double booked and worked in on Tuesday instead at [place], advised Mr.  Crandell I would have to consult with Padonda to see if that was a possibility and call him back.  Please advise.

## 2014-03-27 ENCOUNTER — Other Ambulatory Visit: Payer: Self-pay | Admitting: Family

## 2014-04-01 ENCOUNTER — Encounter: Payer: Self-pay | Admitting: Family

## 2014-04-01 ENCOUNTER — Ambulatory Visit (INDEPENDENT_AMBULATORY_CARE_PROVIDER_SITE_OTHER): Payer: BC Managed Care – PPO | Admitting: Family

## 2014-04-01 VITALS — BP 140/86 | HR 86 | Resp 20 | Ht 60.0 in | Wt 152.0 lb

## 2014-04-01 DIAGNOSIS — J309 Allergic rhinitis, unspecified: Secondary | ICD-10-CM

## 2014-04-01 DIAGNOSIS — E039 Hypothyroidism, unspecified: Secondary | ICD-10-CM

## 2014-04-01 DIAGNOSIS — R7989 Other specified abnormal findings of blood chemistry: Secondary | ICD-10-CM

## 2014-04-01 DIAGNOSIS — R945 Abnormal results of liver function studies: Secondary | ICD-10-CM

## 2014-04-01 MED ORDER — AZELASTINE-FLUTICASONE 137-50 MCG/ACT NA SUSP: 2.0000 | Freq: Two times a day (BID) | NASAL | Status: DC

## 2014-04-01 NOTE — Progress Notes (Signed)
Pre visit review using our clinic review tool, if applicable. No additional management support is needed unless otherwise documented below in the visit note. 

## 2014-04-02 ENCOUNTER — Encounter: Payer: Self-pay | Admitting: Family

## 2014-04-02 LAB — HEPATIC FUNCTION PANEL
ALT: 16 U/L (ref 0–35)
AST: 28 U/L (ref 0–37)
Albumin: 4.2 g/dL (ref 3.5–5.2)
Alkaline Phosphatase: 60 U/L (ref 39–117)
BILIRUBIN TOTAL: 0.6 mg/dL (ref 0.2–1.2)
Bilirubin, Direct: 0.1 mg/dL (ref 0.0–0.3)
Total Protein: 7.7 g/dL (ref 6.0–8.3)

## 2014-04-02 LAB — TSH: TSH: 2.14 u[IU]/mL (ref 0.35–4.50)

## 2014-04-02 NOTE — Progress Notes (Signed)
Subjective:    Patient ID: Danielle Bond, female    DOB: 1960/11/29, 53 y.o.   MRN: [id number]  HPI 53 year old Hispanic female, nonsmoker with a history of hypercholesterolemia, I will need hepatitis, asthma, hypothyroidism is in today as a hospital followup from 03/10/2014. She presented to the emergency department with a rash to her torso and arms. She's been battling with this rash off and on over the past several months. In the emergency department started cephalexin and gastroenterology discontinued it due to the allergy to penicillin. Recently, gastroenterology discontinued Imuran and the rash resolved. She had been seeing a dermatologist at [facility]. Now the rash is resolved, reports occasional palpitations without chest pain not related to exertion. Reports feelings of fatigue and increased sweating. Has a history of hypothyroidism and takes Synthroid daily.   Review of Systems  Constitutional: Positive for fatigue.  Respiratory: Negative.   Cardiovascular: Negative.   Gastrointestinal: Negative.   Endocrine: Negative.   Genitourinary: Negative.   Musculoskeletal: Negative.   Skin: Positive for rash.       Rash improving. No itching  Allergic/Immunologic: Negative.   Neurological: Negative.   Hematological: Negative.   Psychiatric/Behavioral: Negative.   All other systems reviewed and are negative.  Past Medical History  Diagnosis Date  . Asthma   . Mitral valve prolapse   . Chronic kidney disease   . Migraine   . Orthostatic hypotension   . Ulcerative colitis 03/31/2012  . Kidney stone   . Kidney stone     History   Social History  . Marital Status: Married    Spouse Name: N/A    Number of Children: N/A  . Years of Education: N/A   Occupational History  . Not on file.   Social History Main Topics  . Smoking status: Never Smoker   . Smokeless tobacco: Not on file  . Alcohol Use: No  . Drug Use: No  . Sexual Activity: Yes   Other Topics Concern  . Not on  file   Social History Narrative  . No narrative on file    Past Surgical History  Procedure Laterality Date  . Cesarean section    . Thyroidectomy    . Cholecystectomy    . Colonoscopy      Family History  Problem Relation Age of Onset  . Cancer Paternal Aunt   . Heart disease Paternal Grandmother     Allergies  Allergen Reactions  . Almond Oil Hives and Shortness Of Breath  . Other Anaphylaxis    Almonds, tree nuts  . Aspirin Hives and Nausea Only    Stomach cramps  . Penicillins Hives and Swelling    Stomach cramps  . Acetaminophen     Has autoimmune hepatitis, wants to avoid APAP  . Corticosteroids     Had acute pancreatitis x 2 after steroid use.  . Imitrex [Sumatriptan] Swelling    Injection caused swelling  . Stadol [Butorphanol]     hallucinates  . Vancomycin Hives  . Pepcid [Famotidine] Nausea And Vomiting    Makes acid worse  . Prednisone Nausea And Vomiting    All steroids causes nausea and pancreatitis     Current Outpatient Prescriptions on File Prior to Visit  Medication Sig Dispense Refill  . albuterol (PROVENTIL HFA;VENTOLIN HFA) 108 (90 BASE) MCG/ACT inhaler Inhale 2 puffs into the lungs every 6 (six) hours as needed for wheezing.       . cholecalciferol (VITAMIN D) 1000 UNITS tablet Take 2,000 Units by  mouth daily.       Marland Kitchen SYNTHROID 112 MCG tablet 1 TABLET EVERY MORNING ON AN EMPTY STOMACH ONCE A DAY  30 tablet  3  . Triamcinolone Acetonide (TRIAMCINOLONE 0.1 % CREAM : EUCERIN) CREA Apply 1 application topically 2 (two) times daily.  1 each  0  . simvastatin (ZOCOR) 20 MG tablet Take 1 tablet (20 mg total) by mouth at bedtime.  90 tablet  0   No current facility-administered medications on file prior to visit.    BP 140/86  Pulse 86  Resp 20  Ht 5' (1.524 m)  Wt 152 lb (68.947 kg)  BMI 29.69 kg/m2  SpO2 99%  LMP 08/12/2014chart     Objective:   Physical Exam  Constitutional: She is oriented to person, place, and time. She appears  well-developed and well-nourished.  HENT:  Right Ear: External ear normal.  Left Ear: External ear normal.  Nose: Nose normal.  Mouth/Throat: Oropharynx is clear and moist.  Neck: Normal range of motion. Neck supple. No thyromegaly present.  Cardiovascular: Normal rate and normal heart sounds.   Pulmonary/Chest: Effort normal and breath sounds normal.  Abdominal: Soft. Bowel sounds are normal.  Musculoskeletal: Normal range of motion.  Neurological: She is alert and oriented to person, place, and time.  Skin: Skin is warm and dry. No rash noted.  Psychiatric: She has a normal mood and affect.          Assessment & Plan:  Nitisha was seen today for follow-up.  Diagnoses and associated orders for this visit:  Unspecified hypothyroidism - TSH - Hepatic Function Panel  Elevated LFTs - TSH - Hepatic Function Panel  Allergic rhinitis, unspecified allergic rhinitis type  Other Orders - Azelastine-Fluticasone (DYMISTA) 137-50 MCG/ACT SUSP; Place 2 sprays into the nose 2 (two) times daily.   Call the office with any questions or concerns. Recheck as scheduled and sooner as needed. See specialist as scheduled.

## 2014-04-15 ENCOUNTER — Other Ambulatory Visit: Payer: Self-pay

## 2014-04-15 DIAGNOSIS — Z1231 Encounter for screening mammogram for malignant neoplasm of breast: Secondary | ICD-10-CM

## 2014-04-23 ENCOUNTER — Encounter (HOSPITAL_COMMUNITY): Payer: Self-pay | Admitting: Emergency Medicine

## 2014-04-23 ENCOUNTER — Emergency Department (HOSPITAL_COMMUNITY): Payer: BC Managed Care – PPO

## 2014-04-23 ENCOUNTER — Emergency Department (HOSPITAL_COMMUNITY)
Admission: EM | Admit: 2014-04-23 | Discharge: 2014-04-23 | Disposition: A | Discharge status: Home / Self Care | Payer: BC Managed Care – PPO | Attending: Emergency Medicine | Admitting: Emergency Medicine

## 2014-04-23 DIAGNOSIS — Z8679 Personal history of other diseases of the circulatory system: Secondary | ICD-10-CM | POA: Diagnosis not present

## 2014-04-23 DIAGNOSIS — Z880 Allergy status to penicillin: Secondary | ICD-10-CM | POA: Insufficient documentation

## 2014-04-23 DIAGNOSIS — J45909 Unspecified asthma, uncomplicated: Secondary | ICD-10-CM | POA: Insufficient documentation

## 2014-04-23 DIAGNOSIS — Z7951 Long term (current) use of inhaled steroids: Secondary | ICD-10-CM | POA: Diagnosis not present

## 2014-04-23 DIAGNOSIS — G4489 Other headache syndrome: Secondary | ICD-10-CM | POA: Diagnosis not present

## 2014-04-23 DIAGNOSIS — Z79899 Other long term (current) drug therapy: Secondary | ICD-10-CM | POA: Insufficient documentation

## 2014-04-23 DIAGNOSIS — R079 Chest pain, unspecified: Secondary | ICD-10-CM | POA: Diagnosis present

## 2014-04-23 DIAGNOSIS — Z87442 Personal history of urinary calculi: Secondary | ICD-10-CM | POA: Insufficient documentation

## 2014-04-23 DIAGNOSIS — N189 Chronic kidney disease, unspecified: Secondary | ICD-10-CM | POA: Insufficient documentation

## 2014-04-23 DIAGNOSIS — R0789 Other chest pain: Secondary | ICD-10-CM | POA: Diagnosis not present

## 2014-04-23 DIAGNOSIS — Z8719 Personal history of other diseases of the digestive system: Secondary | ICD-10-CM | POA: Diagnosis not present

## 2014-04-23 LAB — URINE MICROSCOPIC-ADD ON

## 2014-04-23 LAB — I-STAT TROPONIN, ED
TROPONIN I, POC: 0 ng/mL (ref 0.00–0.08)
Troponin i, poc: 0 ng/mL (ref 0.00–0.08)

## 2014-04-23 LAB — I-STAT CHEM 8, ED
BUN: 11 mg/dL (ref 6–23)
CHLORIDE: 106 meq/L (ref 96–112)
Calcium, Ion: 1.18 mmol/L (ref 1.12–1.23)
Creatinine, Ser: 0.8 mg/dL (ref 0.50–1.10)
Glucose, Bld: 124 mg/dL — ABNORMAL HIGH (ref 70–99)
HEMATOCRIT: 40 % (ref 36.0–46.0)
Hemoglobin: 13.6 g/dL (ref 12.0–15.0)
POTASSIUM: 3.7 meq/L (ref 3.7–5.3)
SODIUM: 139 meq/L (ref 137–147)
TCO2: 26 mmol/L (ref 0–100)

## 2014-04-23 LAB — URINALYSIS, ROUTINE W REFLEX MICROSCOPIC
Bilirubin Urine: NEGATIVE
Glucose, UA: NEGATIVE mg/dL
Hgb urine dipstick: NEGATIVE
Ketones, ur: NEGATIVE mg/dL
Nitrite: NEGATIVE
PROTEIN: NEGATIVE mg/dL
Specific Gravity, Urine: 1.003 — ABNORMAL LOW (ref 1.005–1.030)
UROBILINOGEN UA: 0.2 mg/dL (ref 0.0–1.0)
pH: 6.5 (ref 5.0–8.0)

## 2014-04-23 LAB — BASIC METABOLIC PANEL
Anion gap: 10 (ref 5–15)
BUN: 12 mg/dL (ref 6–23)
CHLORIDE: 103 meq/L (ref 96–112)
CO2: 27 meq/L (ref 19–32)
Calcium: 9.2 mg/dL (ref 8.4–10.5)
Creatinine, Ser: 0.71 mg/dL (ref 0.50–1.10)
GFR calc Af Amer: >90 mL/min (ref >90)
GFR calc non Af Amer: >90 mL/min (ref >90)
GLUCOSE: 124 mg/dL — AB (ref 70–99)
POTASSIUM: 3.8 meq/L (ref 3.7–5.3)
SODIUM: 140 meq/L (ref 137–147)

## 2014-04-23 LAB — CBC
HCT: 37.6 % (ref 36.0–46.0)
HEMOGLOBIN: 12.7 g/dL (ref 12.0–15.0)
MCH: 29.7 pg (ref 26.0–34.0)
MCHC: 33.8 g/dL (ref 30.0–36.0)
MCV: 87.9 fL (ref 78.0–100.0)
Platelets: 272 10*3/uL (ref 150–400)
RBC: 4.28 MIL/uL (ref 3.87–5.11)
RDW: 12.7 % (ref 11.5–15.5)
WBC: 4.4 10*3/uL (ref 4.0–10.5)

## 2014-04-23 MED ORDER — LORAZEPAM 2 MG/ML IJ SOLN
0.5000 mg | Freq: Once | INTRAMUSCULAR | Status: AC
  Administered 2014-04-23: 0.5 mg via INTRAVENOUS
  Filled 2014-04-23: qty 1

## 2014-04-23 MED ORDER — DIPHENHYDRAMINE HCL 50 MG/ML IJ SOLN
25.0000 mg | Freq: Once | INTRAMUSCULAR | Status: AC
  Administered 2014-04-23: 25 mg via INTRAVENOUS
  Filled 2014-04-23: qty 1

## 2014-04-23 MED ORDER — METOCLOPRAMIDE HCL 5 MG/ML IJ SOLN
10.0000 mg | Freq: Once | INTRAMUSCULAR | Status: AC
  Administered 2014-04-23: 10 mg via INTRAVENOUS
  Filled 2014-04-23: qty 2

## 2014-04-23 NOTE — ED Provider Notes (Signed)
CSN: [id number]     Arrival date & time 04/23/14  1212 History   First MD Initiated Contact with Patient 04/23/14 1231     Chief Complaint  Patient presents with  . Chest Pain     (Consider location/radiation/quality/duration/timing/severity/associated sxs/prior Treatment) HPI  Danielle Bond Is a 53 year old female who presents with chief complaint of headache, chest pain, back pain, and symptoms of presyncope. 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 She had associated vertiginous symptoms. She denies any shortness of breath or diaphoresis but had associated nausea. She cannot take any aspirin because she gets hives. She denies changes in vision.  past medical history of autoimmune hepatitis, ulcerative colitis, orthostatic hypotension and hemiplegic migraines. Patient was seen for a similar complaint on 03/04/2013. She Denies exogenous estrogen use, lower extremity pain or swelling, recent travel or immobilization, history of PE or DVT, family or personal history of bleeding or clotting disorders, cough or hemoptysis. Denies unilateral weakness, facial asymmetry, difficulty with speech, change in gait. Denies photophobia, phonophobia, UL throbbing, N/V, visual changes, stiff neck, neck pain, rash, or "thunderclap" onset.      Past Medical History  Diagnosis Date  . Asthma   . Mitral valve prolapse   . Chronic kidney disease   . Migraine   . Orthostatic hypotension   . Ulcerative colitis 03/31/2012  . Kidney stone   . Kidney stone    Past Surgical History  Procedure Laterality Date  . Cesarean section    . Thyroidectomy    . Cholecystectomy    . Colonoscopy     Family History  Problem Relation Age of Onset  . Cancer Paternal Aunt   . Heart disease Paternal  Grandmother    History  Substance Use Topics  . Smoking status: Never Smoker   . Smokeless tobacco: Not on file  . Alcohol Use: No   OB History   Grav Para Term Preterm Abortions TAB SAB Ect Mult Living   2    2  2   2      Review of Systems  Ten systems reviewed and are negative for acute change, except as noted in the HPI.    Allergies  Almond oil; Other; Aspirin; Penicillins; Acetaminophen; Corticosteroids; Imitrex; Stadol; Vancomycin; Pepcid; and Prednisone  Home Medications   Prior to Admission medications   Medication Sig Start Date End Date Taking? Authorizing Provider  albuterol (PROVENTIL HFA;VENTOLIN HFA) 108 (90 BASE) MCG/ACT inhaler Inhale 2 puffs into the lungs every 6 (six) hours as needed for wheezing.    Yes Historical Provider, MD  Azelastine-Fluticasone (DYMISTA) 137-50 MCG/ACT SUSP Place 2 sprays into the nose 2 (two) times daily. 04/01/14  Yes Timoteo Gaul, FNP  Azelastine-Fluticasone (DYMISTA) 137-50 MCG/ACT SUSP Place 2 puffs into the nose 2 (two) times daily as needed (for shortness of breath).   Yes Historical Provider, MD  levothyroxine (SYNTHROID, LEVOTHROID) 112 MCG tablet Take 112 mcg by mouth daily before breakfast.   Yes Historical Provider, MD  mesalamine (LIALDA) 1.2 G EC tablet Take 2.4 g by mouth 2 (two) times daily.   Yes Historical Provider, MD   BP 105/74  Pulse 78  Temp(Src) 98.1 F (36.7 C) (Oral)  Resp 16  Ht 5' (1.524 m)  Wt  150 lb (68.04 kg)  BMI 29.30 kg/m2  SpO2 98%  LMP 03/03/2013 Physical Exam  Nursing note and vitals reviewed. Constitutional: She is oriented to person, place, and time. She appears well-developed and well-nourished. No distress.  HENT:  Head: Normocephalic and atraumatic.  Mouth/Throat: Oropharynx is clear and moist.  Eyes: Conjunctivae and EOM are normal. Pupils are equal, round, and reactive to light. No scleral icterus.  No horizontal, vertical or rotational nystagmus  Neck: Normal range of motion.  Neck supple.  Full active and passive ROM without pain No midline or paraspinal tenderness No nuchal rigidity or meningeal signs  Cardiovascular: Normal rate, regular rhythm and intact distal pulses.   Pulmonary/Chest: Effort normal and breath sounds normal. No respiratory distress. She has no wheezes. She has no rales. She exhibits tenderness (+ ant/post chest wall tenderness).  Abdominal: Soft. Bowel sounds are normal. There is no tenderness. There is no rebound and no guarding.  Musculoskeletal: Normal range of motion.  Lymphadenopathy:    She has no cervical adenopathy.  Neurological: She is alert and oriented to person, place, and time. She has normal reflexes. No cranial nerve deficit. She exhibits normal muscle tone. Coordination normal.  Mental Status:  Alert, oriented, thought content appropriate. Speech fluent without evidence of aphasia. Able to follow 2 step commands without difficulty.  Cranial Nerves:  II:  Peripheral visual fields grossly normal, pupils equal, round, reactive to light III,IV, VI: ptosis not present, extra-ocular motions intact bilaterally  V,VII: smile symmetric, facial light touch sensation equal VIII: hearing grossly normal bilaterally  IX,X: gag reflex present  XI: bilateral shoulder shrug equal and strong XII: midline tongue extension  Motor:  5/5 in upper and lower extremities bilaterally including strong and equal grip strength and dorsiflexion/plantar flexion Sensory: Pinprick and light touch normal in all extremities.  Deep Tendon Reflexes: 2+ and symmetric  Cerebellar: normal finger-to-nose with bilateral upper extremities Gait: normal gait and balance CV: distal pulses palpable throughout   Skin: Skin is warm and dry. No rash noted. She is not diaphoretic.  Psychiatric: She has a normal mood and affect. Her behavior is normal. Judgment and thought content normal.    ED Course  Procedures (including critical care time) Labs Review Labs  Reviewed  I-STAT CHEM 8, ED - Abnormal; Notable for the following:    Glucose, Bld 124 (*)    All other components within normal limits  CBC  BASIC METABOLIC PANEL  URINALYSIS, ROUTINE W REFLEX MICROSCOPIC  I-STAT TROPOININ, ED    Imaging Review No results found.   EKG Interpretation   Date/Time:  Friday April 23 2014 12:13:03 EDT Ventricular Rate:  78 PR Interval:  160 QRS Duration: 89 QT Interval:  428 QTC Calculation: 487 R Axis:   88 Text Interpretation:  Sinus rhythm Anteroseptal infarct, age indeterminate  No significant change since last tracing Confirmed by Turner  MD, DAVID  ([id number]) on 04/23/2014 12:19:22 PM      MDM   Final diagnoses:  None    2:51 PM Patient seen in shared visit with attending physician. Patient with HA/ chest pressure, back pain, presyncope. She has a hx of orthostatic hypotension and a previous visit for similar complaint in Aug of 2014.  Patient RF for ACS include obesity and brother with MI at age of 14. EKG shows no changes. Labs show no sever abnormality. Her last 3 blood sugars have been elevated. cxr without acute abnormality. Ct head pending   Glucose, Bld  Date Value Ref  Range Status  04/23/2014 124* 70 - 99 mg/dL Final  04/23/2014 124* 70 - 99 mg/dL Final  03/10/2014 115* 70 - 99 mg/dL Final  12/15/2013 87  70 - 99 mg/dL Final     Patient with negative CT head, negative cxr. Onset of headache and sxs are within 6 hours and sensitivity for [facility] r/o nears 100%. I discussed this with the patient and patient declines further investigation with LP. Her headache has improved significantly with intervention. SH is c/pfree and 2 neg troponins.  Patient will be discharged to follow up with her PCP,  BP 103/56  Pulse 70  Temp(Src) 98 F (36.7 C) (Oral)  Resp 17  Ht 5' (1.524 m)  Wt 150 lb (68.04 kg)  BMI 29.30 kg/m2  SpO2 95%  LMP 03/03/2013 The patient appears reasonably screened and/or stabilized for discharge and I doubt  any other medical condition or other [facility] requiring further screening, evaluation, or treatment in the ED at this time prior to discharge.  I personally reviewed the imaging tests through PACS system. I have reviewed and interpreted Lab values. I reviewed available ER/hospitalization records through the EMR  Patient seen in shared visit with attending physician.   Margarita Mail, PA-C 04/27/14 1207

## 2014-04-23 NOTE — ED Notes (Addendum)
Per EMS: Pt was at work when she started to develop sudden onset of dizziness and generalized pressure in chest that radiated to mid back that increased with movement. Pt denies any LOC or syncope, Pt given 1 nitro with no relief. Pt denies any cardiac hx at this time. Nad noted upon arrival.

## 2014-04-23 NOTE — Discharge Instructions (Signed)
Take your meds as prescribed.   Your blood sugar is slightly elevated and should be followed up.   Follow up with your PMD and neurologist.   Return to ER if you have severe headaches, chest pain, shortness of breath.

## 2014-04-27 NOTE — ED Provider Notes (Signed)
Medical screening examination/treatment/procedure(s) were conducted as a shared visit with non-physician practitioner(s) and myself.  I personally evaluated the patient during the encounter.   EKG Interpretation   Date/Time:  Friday April 23 2014 12:13:03 EDT Ventricular Rate:  78 PR Interval:  160 QRS Duration: 89 QT Interval:  428 QTC Calculation: 487 R Axis:   88 Text Interpretation:  Sinus rhythm Anteroseptal infarct, age indeterminate  No significant change since last tracing Confirmed by Jaryn Hocutt  MD, Jazzman Loughmiller  ([id number]) on 04/23/2014 12:19:22 PM      Danielle Bond is a 53 y.o. female hx of CKD, migraines, here with syncope. 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 Had some headache as well. School nurse saw her and wants her evaluated. She states that headache similar to migraines, not sudden onset severe headache. On exam, she appears anxious. Heart, lung, abdomen, neuro exam unremarkable. Labs and CT head unremarkable. I doubt subarachnoid. Given migraine cocktail and felt better and will d/c home.    Wandra Arthurs, MD 04/27/14 2255

## 2014-05-24 ENCOUNTER — Encounter (HOSPITAL_COMMUNITY): Payer: Self-pay | Admitting: Emergency Medicine

## 2014-05-28 ENCOUNTER — Other Ambulatory Visit: Payer: Self-pay | Admitting: Family

## 2014-06-15 ENCOUNTER — Ambulatory Visit: Payer: BC Managed Care – PPO

## 2014-06-15 ENCOUNTER — Other Ambulatory Visit: Payer: Self-pay | Admitting: Nurse Practitioner

## 2014-06-15 ENCOUNTER — Other Ambulatory Visit (HOSPITAL_COMMUNITY)
Admission: RE | Admit: 2014-06-15 | Discharge: 2014-06-15 | Disposition: A | Discharge status: Home / Self Care | Payer: BC Managed Care – PPO | Source: Ambulatory Visit | Attending: Nurse Practitioner | Admitting: Nurse Practitioner

## 2014-06-15 DIAGNOSIS — Z1151 Encounter for screening for human papillomavirus (HPV): Secondary | ICD-10-CM | POA: Diagnosis present

## 2014-06-15 DIAGNOSIS — Z01419 Encounter for gynecological examination (general) (routine) without abnormal findings: Secondary | ICD-10-CM | POA: Diagnosis present

## 2014-06-21 LAB — CYTOLOGY - PAP

## 2014-07-04 ENCOUNTER — Other Ambulatory Visit: Payer: Self-pay | Admitting: Family

## 2014-07-12 ENCOUNTER — Ambulatory Visit: Payer: BC Managed Care – PPO

## 2014-07-13 ENCOUNTER — Ambulatory Visit: Payer: BC Managed Care – PPO

## 2014-07-19 ENCOUNTER — Other Ambulatory Visit: Payer: Self-pay | Admitting: Family

## 2014-07-19 DIAGNOSIS — R0989 Other specified symptoms and signs involving the circulatory and respiratory systems: Secondary | ICD-10-CM

## 2014-07-22 ENCOUNTER — Ambulatory Visit
Admission: RE | Admit: 2014-07-22 | Discharge: 2014-07-22 | Disposition: A | Discharge status: Home / Self Care | Payer: BC Managed Care – PPO | Source: Ambulatory Visit | Attending: Family | Admitting: Family

## 2014-07-22 ENCOUNTER — Ambulatory Visit
Admission: RE | Admit: 2014-07-22 | Discharge: 2014-07-22 | Disposition: A | Discharge status: Home / Self Care | Payer: BC Managed Care – PPO | Source: Ambulatory Visit

## 2014-07-22 DIAGNOSIS — Z1231 Encounter for screening mammogram for malignant neoplasm of breast: Secondary | ICD-10-CM

## 2014-07-22 DIAGNOSIS — R0989 Other specified symptoms and signs involving the circulatory and respiratory systems: Secondary | ICD-10-CM

## 2014-08-12 ENCOUNTER — Emergency Department (HOSPITAL_COMMUNITY): Payer: BLUE CROSS/BLUE SHIELD

## 2014-08-12 ENCOUNTER — Other Ambulatory Visit: Payer: Self-pay | Admitting: Family

## 2014-08-12 ENCOUNTER — Emergency Department (HOSPITAL_COMMUNITY)
Admission: EM | Admit: 2014-08-12 | Discharge: 2014-08-12 | Disposition: A | Discharge status: Home / Self Care | Payer: BLUE CROSS/BLUE SHIELD | Attending: Emergency Medicine | Admitting: Emergency Medicine

## 2014-08-12 ENCOUNTER — Encounter (HOSPITAL_COMMUNITY): Payer: Self-pay | Admitting: Emergency Medicine

## 2014-08-12 DIAGNOSIS — I209 Angina pectoris, unspecified: Secondary | ICD-10-CM

## 2014-08-12 DIAGNOSIS — N189 Chronic kidney disease, unspecified: Secondary | ICD-10-CM | POA: Insufficient documentation

## 2014-08-12 DIAGNOSIS — Z79899 Other long term (current) drug therapy: Secondary | ICD-10-CM | POA: Insufficient documentation

## 2014-08-12 DIAGNOSIS — Z87442 Personal history of urinary calculi: Secondary | ICD-10-CM | POA: Insufficient documentation

## 2014-08-12 DIAGNOSIS — Z880 Allergy status to penicillin: Secondary | ICD-10-CM | POA: Diagnosis not present

## 2014-08-12 DIAGNOSIS — R079 Chest pain, unspecified: Secondary | ICD-10-CM | POA: Diagnosis present

## 2014-08-12 DIAGNOSIS — J45909 Unspecified asthma, uncomplicated: Secondary | ICD-10-CM | POA: Diagnosis not present

## 2014-08-12 DIAGNOSIS — Z8719 Personal history of other diseases of the digestive system: Secondary | ICD-10-CM | POA: Insufficient documentation

## 2014-08-12 LAB — CBC
HCT: 39 % (ref 36.0–46.0)
Hemoglobin: 12.7 g/dL (ref 12.0–15.0)
MCH: 28.6 pg (ref 26.0–34.0)
MCHC: 32.6 g/dL (ref 30.0–36.0)
MCV: 87.8 fL (ref 78.0–100.0)
PLATELETS: 278 10*3/uL (ref 150–400)
RBC: 4.44 MIL/uL (ref 3.87–5.11)
RDW: 12.5 % (ref 11.5–15.5)
WBC: 5.6 10*3/uL (ref 4.0–10.5)

## 2014-08-12 LAB — I-STAT TROPONIN, ED
Troponin i, poc: 0 ng/mL (ref 0.00–0.08)
Troponin i, poc: 0 ng/mL (ref 0.00–0.08)

## 2014-08-12 LAB — BASIC METABOLIC PANEL
ANION GAP: 7 (ref 5–15)
BUN: 17 mg/dL (ref 6–23)
CALCIUM: 9.5 mg/dL (ref 8.4–10.5)
CHLORIDE: 104 meq/L (ref 96–112)
CO2: 26 mmol/L (ref 19–32)
Creatinine, Ser: 0.72 mg/dL (ref 0.50–1.10)
GFR calc Af Amer: >90 mL/min (ref >90)
Glucose, Bld: 113 mg/dL — ABNORMAL HIGH (ref 70–99)
POTASSIUM: 4.2 mmol/L (ref 3.5–5.1)
Sodium: 137 mmol/L (ref 135–145)

## 2014-08-12 MED ORDER — ONDANSETRON HCL 4 MG/2ML IJ SOLN
4.0000 mg | Freq: Once | INTRAMUSCULAR | Status: AC
  Administered 2014-08-12: 4 mg via INTRAVENOUS
  Filled 2014-08-12: qty 2

## 2014-08-12 MED ORDER — NITROGLYCERIN 0.4 MG SL SUBL
0.4000 mg | SUBLINGUAL_TABLET | SUBLINGUAL | Status: AC | PRN
  Administered 2014-08-12 (×3): 0.4 mg via SUBLINGUAL
  Filled 2014-08-12: qty 1

## 2014-08-12 MED ORDER — SODIUM CHLORIDE 0.9 % IV BOLUS (SEPSIS)
1000.0000 mL | Freq: Once | INTRAVENOUS | Status: AC
  Administered 2014-08-12: 1000 mL via INTRAVENOUS

## 2014-08-12 NOTE — ED Notes (Signed)
Pt ambulatory with steady gait to void in BR.  Pain 2/10.  Denies other needs/complaints at this time.  NAD.

## 2014-08-12 NOTE — ED Notes (Signed)
Bed: WA07 Expected date:  Expected time:  Means of arrival:  Comments: Hold for triage 1

## 2014-08-12 NOTE — Discharge Instructions (Signed)
Please follow up with Dr. Irven Shelling office on Monday at 1pm for further evaluation of your chest pain.  Return if your condition worsen or if you have other concerns.   Angina Pectoris Angina pectoris, often just called angina, is extreme discomfort in your chest, neck, or arm caused by a lack of blood in the middle and thickest layer of your heart wall (myocardium). It may feel like tightness or heavy pressure. It may feel like a crushing or squeezing pain. Some people say it feels like gas or indigestion. It may go down your shoulders, back, and arms. Some people may have symptoms other than pain. These symptoms include fatigue, shortness of breath, cold sweats, or nausea. There are four different types of angina:  Stable angina--Stable angina usually occurs in episodes of predictable frequency and duration. It usually is brought on by physical activity, emotional stress, or excitement. These are all times when the myocardium needs more oxygen. Stable angina usually lasts a few minutes and often is relieved by taking a medicine that can be taken under your tongue (sublingually). The medicine is called nitroglycerin. Stable angina is caused by a buildup of plaque inside the arteries, which restricts blood flow to the heart muscle (atherosclerosis).  Unstable angina--Unstable angina can occur even when your body experiences little or no physical exertion. It can occur during sleep. It can also occur at rest. It can suddenly increase in severity or frequency. It might not be relieved by sublingual nitroglycerin. It can last up to 30 minutes. The most common cause of unstable angina is a blood clot that has developed on the top of plaque buildup inside a coronary artery. It can lead to a heart attack if the blood clot completely blocks the artery.  Microvascular angina--This type of angina is caused by a disorder of tiny blood vessels called arterioles. Microvascular angina is more common in women. The pain may  be more severe and last longer than other types of angina pectoris.  Prinzmetal or variant angina--This type of angina pectoris usually occurs when your body experiences little or no physical exertion. It especially occurs in the early morning hours. It is caused by a spasm of your coronary artery. HOME CARE INSTRUCTIONS   Only take over-the-counter and prescription medicines as directed by your health care provider.  Stay active or increase your exercise as directed by your health care provider.  Limit strenuous activity as directed by your health care provider.  Limit heavy lifting as directed by your health care provider.  Maintain a healthy weight.  Learn about and eat heart-healthy foods.  Do not use any tobacco products including cigarettes, chewing tobacco or electronic cigarettes. SEEK IMMEDIATE MEDICAL CARE IF:  You experience the following symptoms:  Chest, neck, deep shoulder, or arm pain or discomfort that lasts more than a few minutes.  Chest, neck, deep shoulder, or arm pain or discomfort that goes away and comes back, repeatedly.  Heavy sweating with discomfort, without a noticeable cause.  Shortness of breath or difficulty breathing.  Angina that does not get better after a few minutes of rest or after taking sublingual nitroglycerin. These can all be symptoms of a heart attack, which is a medical emergency! Get medical help at once. Call your local emergency service (911 in [place] immediately. Do not  drive yourself to the hospital and do not  wait to for your symptoms to go away. MAKE SURE YOU:  Understand these instructions.  Will watch your condition.  Will get  help right away if you are not doing well or get worse. Document Released: 07/09/2005 Document Revised: 07/14/2013 Document Reviewed: 11/10/2013 [facility] Patient Information 2015 [place], [place]. This information is not intended to replace advice given to you by your health care provider. Make sure you  discuss any questions you have with your health care provider.

## 2014-08-12 NOTE — ED Notes (Signed)
Per pt, states central chest pain that started 30 minutes ago-states pain is recurrent-was told she had a mild heart attack about a month ago-recently had stress test and carotid US-all of which were normal

## 2014-08-12 NOTE — ED Provider Notes (Signed)
CSN: [id number]     Arrival date & time 08/12/14  1018 History   First MD Initiated Contact with Patient 08/12/14 1109     Chief Complaint  Patient presents with  . Chest Pain     (Consider location/radiation/quality/duration/timing/severity/associated sxs/prior Treatment) HPI   54 year old female with history of asthma, mitral valve prolapse, chronic kidney disease who presents for evaluation of chest pain. 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 Associate symptoms including lightheadedness, dizziness, nausea and having difficulty taking deep breath. Her symptoms is similar to the episode a year ago when she was evaluated in the ER and was felt that she may have a mild heart attack. Patient states several months ago she was evaluated by a cardiologist which include a cardiac stress test and a 2-D echo. Patient was told that she has some mild plaque in the artery on carotid doppler and was given Crestor as treatment. She does admits to a family history of cardiac disease however patient denies history of diabetes, smoker, or other significant cardiac risks.  She denies any strenuous activities, denies having fever, severe headache, abdominal pain, vomiting, diarrhea, dysuria, or rash. No specific treatment tried prior to arrival. Patient is allergic to aspirin. Patient reports she also cannot tolerate morphine or Dilaudid.  Past Medical History  Diagnosis Date  . Asthma   . Mitral valve prolapse   . Chronic kidney disease   . Migraine   . Orthostatic hypotension   . Ulcerative colitis 03/31/2012  . Kidney stone   . Kidney stone    Past Surgical History  Procedure Laterality Date  . Cesarean section    . Thyroidectomy    . Cholecystectomy    . Colonoscopy     Family  History  Problem Relation Age of Onset  . Cancer Paternal Aunt   . Heart disease Paternal Grandmother    History  Substance Use Topics  . Smoking status: Never Smoker   . Smokeless tobacco: Not on file  . Alcohol Use: No   OB History    Gravida Para Term Preterm AB TAB SAB Ectopic Multiple Living   2    2  2   2      Review of Systems  All other systems reviewed and are negative.     Allergies  Almond oil; Other; Aspirin; Penicillins; Acetaminophen; Corticosteroids; Imitrex; Stadol; Vancomycin; Pepcid; and Prednisone  Home Medications   Prior to Admission medications   Medication Sig Start Date End Date Taking? Authorizing Provider  albuterol (PROVENTIL HFA;VENTOLIN HFA) 108 (90 BASE) MCG/ACT inhaler Inhale 2 puffs into the lungs every 6 (six) hours as needed for wheezing.     Historical Provider, MD  DYMISTA 137-50 MCG/ACT SUSP PLACE 2 SPRAYS INTO THE NOSE 2 (TWO) TIMES DAILY. 07/05/14   Timoteo Gaul, FNP  mesalamine (LIALDA) 1.2 G EC tablet Take 2.4 g by mouth 2 (two) times daily.    Historical Provider, MD  SYNTHROID 112 MCG tablet 1 TABLET EVERY MORNING ON AN EMPTY STOMACH ONCE A DAY 08/12/14   Timoteo Gaul, FNP   BP 154/115 mmHg  Pulse 104  Temp(Src) 98.6 F (37 C) (Oral)  Resp 18  SpO2 100%  LMP 03/03/2013 Physical Exam  Constitutional: She appears well-developed and  well-nourished. No distress.  HENT:  Head: Atraumatic.  Eyes: Conjunctivae are normal.  Neck: Neck supple.  Cardiovascular: Normal rate, regular rhythm and intact distal pulses.  Exam reveals no gallop and no friction rub.   No murmur heard. Pulmonary/Chest: Effort normal and breath sounds normal. No respiratory distress. She has no wheezes. She has no rales. She exhibits tenderness (Mild diffuse anterior chest wall tenderness to palpation without crepitus or emphysema.).  Abdominal: Soft. There is no tenderness.  Musculoskeletal: She exhibits no edema.  Neurological: She is alert.   Skin: No rash noted.  Psychiatric: She has a normal mood and affect.  Nursing note and vitals reviewed.   ED Course  Procedures (including critical care time)   Patient presents with chest pain. Workup initiated. Care discussed with Dr. Ralene Bathe.  SL nitro given for sxs.    12:53 PM Patient states she felt better after receiving 3 sublingual nitroglycerin. Her chest discomfort is rated as a 4 this time. A chest x-ray, EKG, troponin, and her labs are reassuring.  Patient has had cardiac stress test and 2-D echo with Doppler. She has not had any heart catheterization procedure. Plan to consult Dr. Einar Gip for further recommendation.  1:08 PM I have consulted with cardiologist Dr. Einar Gip who recommend for pt to f/u in office on Monday at 1pm for further care.  Pt agrees with plan and she feels comfortable going home.    Just to ensure that patient has a negative delta troponin, the second set of troponin will be drawn at 2:30 PM. Patient voiced understanding and agrees with plan.  3:22 PM HEART score of 2.  Delta trop negative.  Pt is sxs free.  Stable for discharge.  Pt agrees with plan.    Labs Review Labs Reviewed  BASIC METABOLIC PANEL - Abnormal; Notable for the following:    Glucose, Bld 113 (*)    All other components within normal limits  CBC  I-STAT TROPOININ, ED  Randolm Idol, ED    Imaging Review Dg Chest 2 View  08/12/2014   CLINICAL DATA:  Chest pain.  Cough for 2 months  EXAM: CHEST  2 VIEW  COMPARISON:  April 23, 2014  FINDINGS: There is no edema or consolidation. The heart size and pulmonary vascularity are normal. No adenopathy. No pneumothorax. There are surgical clips in lower neck region bilaterally. There is lower thoracic dextroscoliosis.  IMPRESSION: No edema or consolidation.   Electronically Signed   By: Lowella Grip M.D.   On: 08/12/2014 11:11     EKG Interpretation   Date/Time:  Thursday August 12 2014 10:24:25 EST Ventricular Rate:  106 PR  Interval:  164 QRS Duration: 80 QT Interval:  349 QTC Calculation: 463 R Axis:   82 Text Interpretation:  Sinus tachycardia Confirmed by Hazle Coca [phone]) on  08/12/2014 10:47:41 AM      MDM   Final diagnoses:  Angina pectoris    BP 123/78 mmHg  Pulse 90  Temp(Src) 98.6 F (37 C) (Oral)  Resp 16  SpO2 99%  LMP 03/03/2013  I have reviewed nursing notes and vital signs. I personally reviewed the imaging tests through PACS system  I reviewed available ER/hospitalization records thought the EMR     Domenic Moras, PA-C 08/12/14 [place], MD 08/12/14 [phone]

## 2014-08-24 ENCOUNTER — Other Ambulatory Visit: Payer: Self-pay | Admitting: Internal Medicine

## 2014-08-24 DIAGNOSIS — Z9009 Acquired absence of other part of head and neck: Secondary | ICD-10-CM

## 2014-08-24 DIAGNOSIS — E890 Postprocedural hypothyroidism: Secondary | ICD-10-CM

## 2014-08-27 ENCOUNTER — Other Ambulatory Visit: Payer: BLUE CROSS/BLUE SHIELD

## 2014-09-17 ENCOUNTER — Ambulatory Visit
Admission: RE | Admit: 2014-09-17 | Discharge: 2014-09-17 | Disposition: A | Discharge status: Home / Self Care | Payer: BLUE CROSS/BLUE SHIELD | Source: Ambulatory Visit | Attending: Internal Medicine | Admitting: Internal Medicine

## 2014-09-17 DIAGNOSIS — E890 Postprocedural hypothyroidism: Secondary | ICD-10-CM

## 2014-09-17 DIAGNOSIS — Z9009 Acquired absence of other part of head and neck: Secondary | ICD-10-CM

## 2014-12-11 ENCOUNTER — Other Ambulatory Visit: Payer: Self-pay | Admitting: Family

## 2014-12-22 ENCOUNTER — Other Ambulatory Visit: Payer: BLUE CROSS/BLUE SHIELD

## 2015-02-04 ENCOUNTER — Other Ambulatory Visit: Payer: Self-pay | Admitting: Family

## 2015-04-25 ENCOUNTER — Emergency Department (HOSPITAL_COMMUNITY): Payer: BLUE CROSS/BLUE SHIELD

## 2015-04-25 ENCOUNTER — Emergency Department (HOSPITAL_COMMUNITY)
Admission: EM | Admit: 2015-04-25 | Discharge: 2015-04-25 | Disposition: A | Discharge status: Home / Self Care | Payer: BLUE CROSS/BLUE SHIELD | Attending: Emergency Medicine | Admitting: Emergency Medicine

## 2015-04-25 DIAGNOSIS — Z79899 Other long term (current) drug therapy: Secondary | ICD-10-CM | POA: Insufficient documentation

## 2015-04-25 DIAGNOSIS — N189 Chronic kidney disease, unspecified: Secondary | ICD-10-CM | POA: Diagnosis not present

## 2015-04-25 DIAGNOSIS — Z8679 Personal history of other diseases of the circulatory system: Secondary | ICD-10-CM | POA: Diagnosis not present

## 2015-04-25 DIAGNOSIS — Z87442 Personal history of urinary calculi: Secondary | ICD-10-CM | POA: Insufficient documentation

## 2015-04-25 DIAGNOSIS — K859 Acute pancreatitis without necrosis or infection, unspecified: Secondary | ICD-10-CM | POA: Insufficient documentation

## 2015-04-25 DIAGNOSIS — Z880 Allergy status to penicillin: Secondary | ICD-10-CM | POA: Diagnosis not present

## 2015-04-25 DIAGNOSIS — J45909 Unspecified asthma, uncomplicated: Secondary | ICD-10-CM | POA: Diagnosis not present

## 2015-04-25 DIAGNOSIS — R079 Chest pain, unspecified: Secondary | ICD-10-CM | POA: Diagnosis present

## 2015-04-25 LAB — COMPREHENSIVE METABOLIC PANEL
ALBUMIN: 4 g/dL (ref 3.5–5.0)
ALT: 22 U/L (ref 14–54)
AST: 39 U/L (ref 15–41)
Alkaline Phosphatase: 67 U/L (ref 38–126)
Anion gap: 7 (ref 5–15)
BILIRUBIN TOTAL: 0.4 mg/dL (ref 0.3–1.2)
BUN: 11 mg/dL (ref 6–20)
CHLORIDE: 108 mmol/L (ref 101–111)
CO2: 24 mmol/L (ref 22–32)
CREATININE: 0.68 mg/dL (ref 0.44–1.00)
Calcium: 9.2 mg/dL (ref 8.9–10.3)
GFR calc Af Amer: >60 mL/min (ref >60)
GLUCOSE: 120 mg/dL — AB (ref 65–99)
POTASSIUM: 4.1 mmol/L (ref 3.5–5.1)
Sodium: 139 mmol/L (ref 135–145)
Total Protein: 7.7 g/dL (ref 6.5–8.1)

## 2015-04-25 LAB — URINALYSIS, ROUTINE W REFLEX MICROSCOPIC
Bilirubin Urine: NEGATIVE
GLUCOSE, UA: NEGATIVE mg/dL
Hgb urine dipstick: NEGATIVE
Ketones, ur: NEGATIVE mg/dL
LEUKOCYTES UA: NEGATIVE
NITRITE: NEGATIVE
PH: 5.5 (ref 5.0–8.0)
Protein, ur: NEGATIVE mg/dL
SPECIFIC GRAVITY, URINE: 1.016 (ref 1.005–1.030)
Urobilinogen, UA: 0.2 mg/dL (ref 0.0–1.0)

## 2015-04-25 LAB — CBC
HCT: 37 % (ref 36.0–46.0)
HEMOGLOBIN: 12.3 g/dL (ref 12.0–15.0)
MCH: 29.1 pg (ref 26.0–34.0)
MCHC: 33.2 g/dL (ref 30.0–36.0)
MCV: 87.5 fL (ref 78.0–100.0)
PLATELETS: 229 10*3/uL (ref 150–400)
RBC: 4.23 MIL/uL (ref 3.87–5.11)
RDW: 12.2 % (ref 11.5–15.5)
WBC: 5.3 10*3/uL (ref 4.0–10.5)

## 2015-04-25 LAB — LIPASE, BLOOD: Lipase: 622 U/L — ABNORMAL HIGH (ref 22–51)

## 2015-04-25 LAB — I-STAT TROPONIN, ED: Troponin i, poc: 0 ng/mL (ref 0.00–0.08)

## 2015-04-25 MED ORDER — ONDANSETRON HCL 4 MG/2ML IJ SOLN
4.0000 mg | Freq: Once | INTRAMUSCULAR | Status: AC
  Administered 2015-04-25: 4 mg via INTRAVENOUS
  Filled 2015-04-25: qty 2

## 2015-04-25 MED ORDER — SODIUM CHLORIDE 0.9 % IV SOLN
1000.0000 mL | Freq: Once | INTRAVENOUS | Status: AC
  Administered 2015-04-25: 1000 mL via INTRAVENOUS

## 2015-04-25 MED ORDER — ONDANSETRON 8 MG PO TBDP: 8.0000 mg | ORAL_TABLET | Freq: Three times a day (TID) | ORAL | Status: DC | PRN

## 2015-04-25 MED ORDER — OXYCODONE HCL 5 MG PO TABS: 5.0000 mg | ORAL_TABLET | ORAL | Status: DC | PRN

## 2015-04-25 MED ORDER — SODIUM CHLORIDE 0.9 % IV SOLN: 1000.0000 mL | INTRAVENOUS | Status: DC

## 2015-04-25 NOTE — ED Notes (Signed)
Patient transported to X-ray 

## 2015-04-25 NOTE — ED Provider Notes (Signed)
CSN: [id number]     Arrival date & time 04/25/15  1119 History   First MD Initiated Contact with Patient 04/25/15 1143     Chief Complaint  Patient presents with  . Chest Pain   Patient is a 54 y.o. female presenting with chest pain. The history is provided by the patient.  Chest Pain Pain location:  R chest Pain quality: sharp   Radiates to: back. Pain severity:  Severe Onset quality:  Sudden Duration:  2 hours Timing:  Constant Progression:  Improving Associated symptoms: diaphoresis, nausea and shortness of breath   Associated symptoms: no fever and not vomiting   Risk factors: no prior DVT/PE    patient states the pain is sharp in the right side of her chest and upper abdomen below her breast. She was not eating or drinking anything or doing anything in particular for an episode started. The pain at its worstwas a 10 out of 10.   She had nausea as well with it. Symptoms are similar to when she had pancreatitis in the past. Patient does not drink alcohol. She has had her gallbladder removed. She was told that her pancreatitis was related to steroids use in the past. She had an extensive evaluation previously after her initial bout of pancreatitis.  Past Medical History  Diagnosis Date  . Asthma   . Mitral valve prolapse   . Chronic kidney disease   . Migraine   . Orthostatic hypotension   . Ulcerative colitis 03/31/2012  . Kidney stone   . Kidney stone    Past Surgical History  Procedure Laterality Date  . Cesarean section    . Thyroidectomy    . Cholecystectomy    . Colonoscopy     Family History  Problem Relation Age of Onset  . Cancer Paternal Aunt   . Heart disease Paternal Grandmother    Social History  Substance Use Topics  . Smoking status: Never Smoker   . Smokeless tobacco: Not on file  . Alcohol Use: No   OB History    Gravida Para Term Preterm AB TAB SAB Ectopic Multiple Living   2    2  2   2      Review of Systems  Constitutional: Positive for  diaphoresis. Negative for fever.  Respiratory: Positive for shortness of breath.   Cardiovascular: Positive for chest pain.  Gastrointestinal: Positive for nausea. Negative for vomiting.  All other systems reviewed and are negative.     Allergies  Almond oil; Other; Aspirin; Hydromorphone hcl; Penicillins; Acetaminophen; Corticosteroids; Imitrex; Stadol; Vancomycin; Pepcid; and Prednisone  Home Medications   Prior to Admission medications   Medication Sig Start Date End Date Taking? Authorizing Provider  acidophilus (RISAQUAD) CAPS capsule Take 1 capsule by mouth daily.   Yes Historical Provider, MD  albuterol (PROVENTIL HFA;VENTOLIN HFA) 108 (90 BASE) MCG/ACT inhaler Inhale 2 puffs into the lungs every 6 (six) hours as needed for wheezing.    Yes Historical Provider, MD  Cholecalciferol (VITAMIN D) 2000 UNITS CAPS Take 1 capsule by mouth daily.   Yes Historical Provider, MD  CRESTOR 10 MG tablet Take 1 tablet by mouth daily. 08/12/14  Yes Historical Provider, MD  DYMISTA 137-50 MCG/ACT SUSP PLACE 2 SPRAYS INTO THE NOSE 2 (TWO) TIMES DAILY. 07/05/14  Yes Kennyth Arnold, FNP  mesalamine (LIALDA) 1.2 G EC tablet Take 3.6 g by mouth daily with breakfast.    Yes Historical Provider, MD  SYNTHROID 112 MCG tablet 1 TABLET EVERY MORNING  ON AN EMPTY STOMACH ONCE A DAY 12/13/14  Yes Kennyth Arnold, FNP  ondansetron (ZOFRAN ODT) 8 MG disintegrating tablet Take 1 tablet (8 mg total) by mouth every 8 (eight) hours as needed for nausea or vomiting. 04/25/15   Dorie Rank, MD  oxyCODONE (ROXICODONE) 5 MG immediate release tablet Take 1 tablet (5 mg total) by mouth every 4 (four) hours as needed for severe pain. 04/25/15   Dorie Rank, MD   BP 120/76 mmHg  Pulse 90  Temp(Src) 98.2 F (36.8 C) (Oral)  Resp 20  SpO2 99%  LMP 03/03/2013 Physical Exam  Constitutional: She appears well-developed and well-nourished. No distress.  HENT:  Head: Normocephalic and atraumatic.  Right Ear: External ear normal.   Left Ear: External ear normal.  Eyes: Conjunctivae are normal. Right eye exhibits no discharge. Left eye exhibits no discharge. No scleral icterus.  Neck: Neck supple. No tracheal deviation present.  Cardiovascular: Normal rate, regular rhythm and intact distal pulses.   Pulmonary/Chest: Effort normal and breath sounds normal. No stridor. No respiratory distress. She has no wheezes. She has no rales.  Abdominal: Soft. Bowel sounds are normal. She exhibits no distension. There is tenderness in the right upper quadrant. There is no rebound and no guarding.  Musculoskeletal: She exhibits no edema or tenderness.  Neurological: She is alert. She has normal strength. No cranial nerve deficit (no facial droop, extraocular movements intact, no slurred speech) or sensory deficit. She exhibits normal muscle tone. She displays no seizure activity. Coordination normal.  Skin: Skin is warm and dry. No rash noted.  Psychiatric: She has a normal mood and affect.  Nursing note and vitals reviewed.   ED Course  Procedures (including critical care time) Labs Review Labs Reviewed  COMPREHENSIVE METABOLIC PANEL - Abnormal; Notable for the following:    Glucose, Bld 120 (*)    All other components within normal limits  LIPASE, BLOOD - Abnormal; Notable for the following:    Lipase 622 (*)    All other components within normal limits  CBC  URINALYSIS, ROUTINE W REFLEX MICROSCOPIC (NOT AT [facility])  Randolm Idol, ED    Imaging Review Dg Chest 2 View  04/25/2015   CLINICAL DATA:  Midline chest pain radiating to the back.  EXAM: CHEST  2 VIEW  COMPARISON:  08/12/2014  FINDINGS: Cardiomediastinal silhouette is normal. Mediastinal contours appear intact.  There is no evidence of focal airspace consolidation, pleural effusion or pneumothorax.  Osseous structures are without acute abnormality. Soft tissues are grossly normal.  IMPRESSION: No radiographic evidence of acute cardiopulmonary abnormality.    Electronically Signed   By: Fidela Salisbury M.D.   On: 04/25/2015 12:57   I have personally reviewed and evaluated these images and lab results as part of my medical decision-making.   EKG Interpretation   Date/Time:  Monday April 25 2015 11:25:25 EDT Ventricular Rate:  75 PR Interval:  160 QRS Duration: 80 QT Interval:  386 QTC Calculation: 431 R Axis:   71 Text Interpretation:  Normal sinus rhythm Normal ECG No significant change  since last tracing Confirmed by Millicent Blazejewski  MD-J, Berk Pilot ([id number]) on 04/25/2015  11:30:50 AM      MDM   Final diagnoses:  Acute pancreatitis, unspecified pancreatitis type   Patient's laboratory tests are consistent with recurrent episode of pancreatitis. Cause of this is unclear at this point. Patient is not on any steroids currently. Patient did not want any medications for pain. Her symptoms had resolved significantly by the  time she was seen. I discussed the findings with the patient. She is comfortable with going home. She will liquid diet. Prescription for oxycodone for pain and Zofran for nausea. I reviewed her allergies and patient has had intolerances to other opiate pain medications. Patient sees Dr. Collene Mares, gastroenterology. She will call to make a close follow-up appointment     Dorie Rank, MD 04/25/15 [phone]

## 2015-04-25 NOTE — ED Notes (Signed)
Dr Tomi Bamberger updating pt.

## 2015-04-25 NOTE — Discharge Instructions (Signed)

## 2015-04-25 NOTE — ED Notes (Signed)
Pt to department via EMS- pt reports she had a sudden onset of chest pressure and SOB about 10am. Reports it lasted about 5 minutes. Then had bilateral flank pain that radiated around to the front of her abd. Pain 10/10 with nausea. Hx of pancreatitis. Given 63m Zofran. 247mleft hand. Bp-129/76 Hr-91 Sats-97 no pain on arrival.

## 2015-05-20 IMAGING — US US SOFT TISSUE HEAD/NECK
1 series · 14 of 20 positions shown · non-contrast
Comparison: None.

CLINICAL DATA: Subtotal thyroidectomy (1662). History of Graves
disease.

EXAM:
THYROID ULTRASOUND
TECHNIQUE: Ultrasound examination of the thyroid gland and adjacent soft
tissues was performed.

[Series 1: us soft tissue head/neck · 0.07mm/px · 14 of 20 slices shown]
[im 1/20]
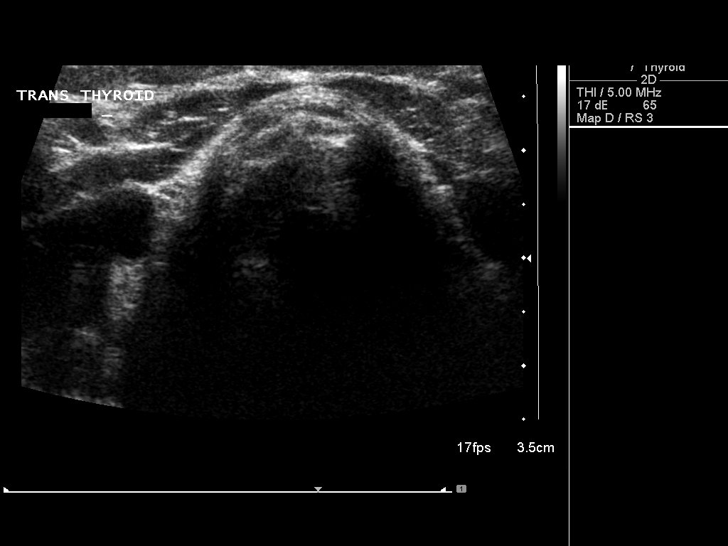
[im 3/20]
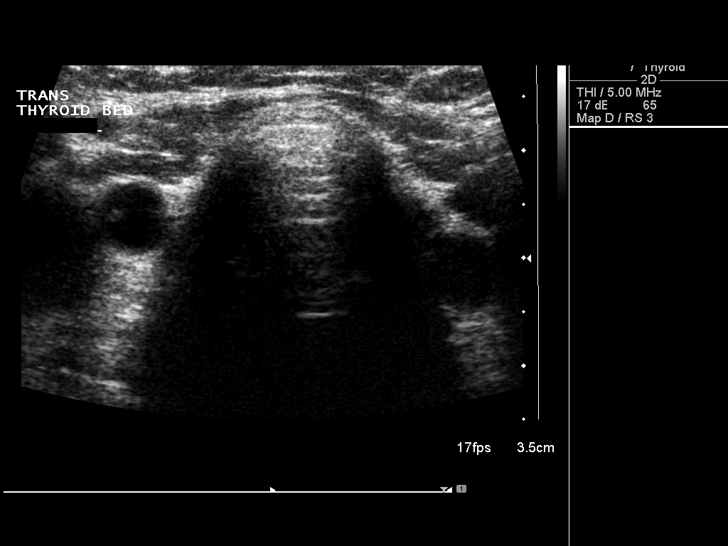
[im 4/20]
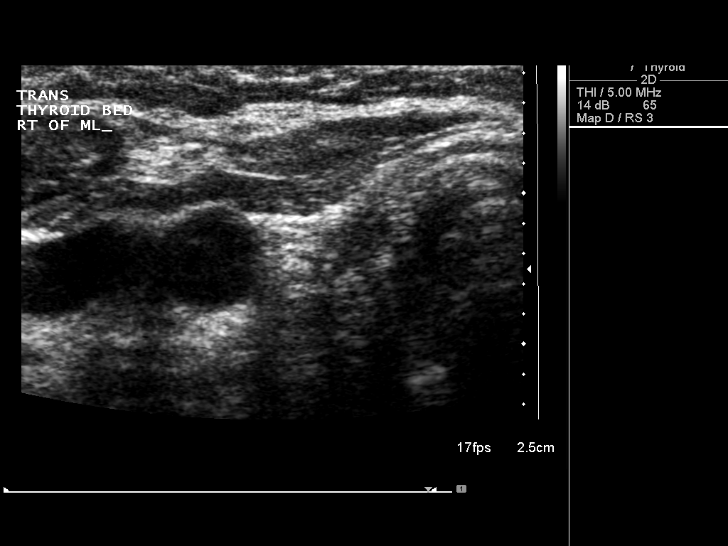
[im 6/20]
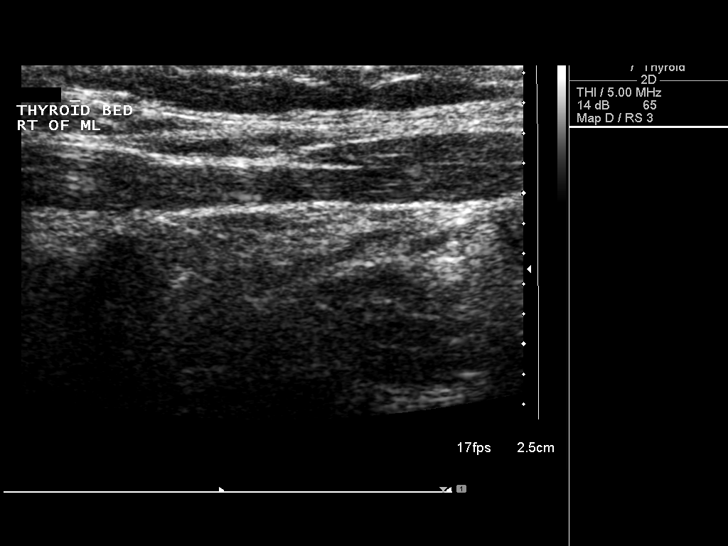
[im 7/20]
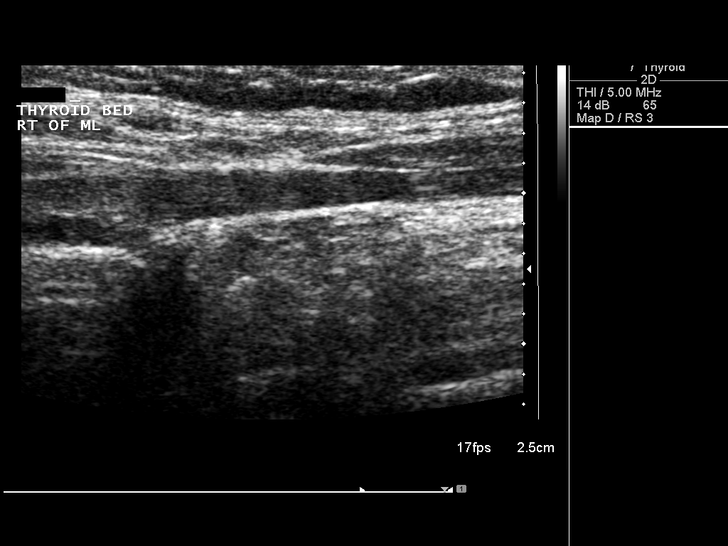
[im 8/20]
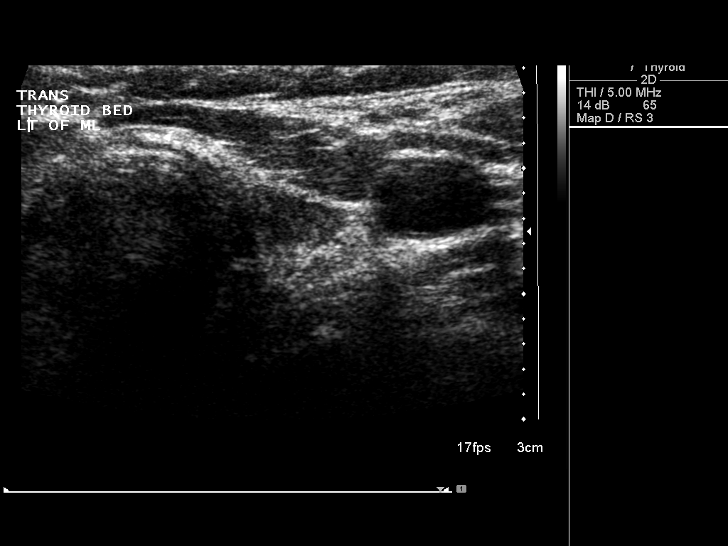
[im 10/20]
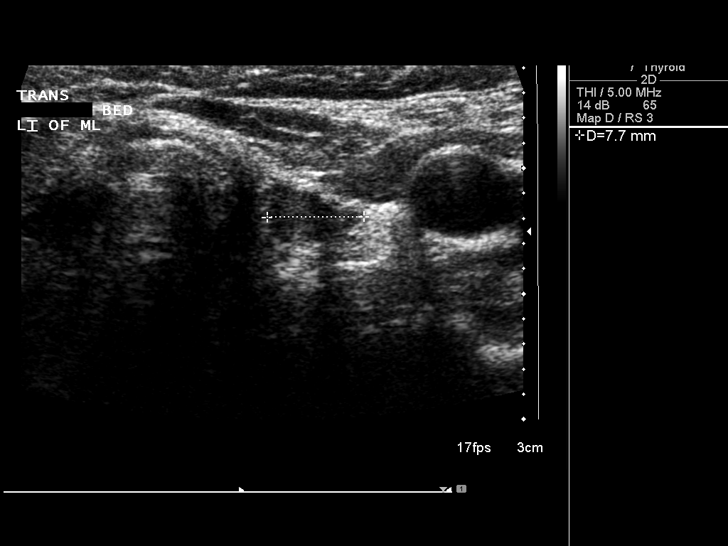
[im 11/20]
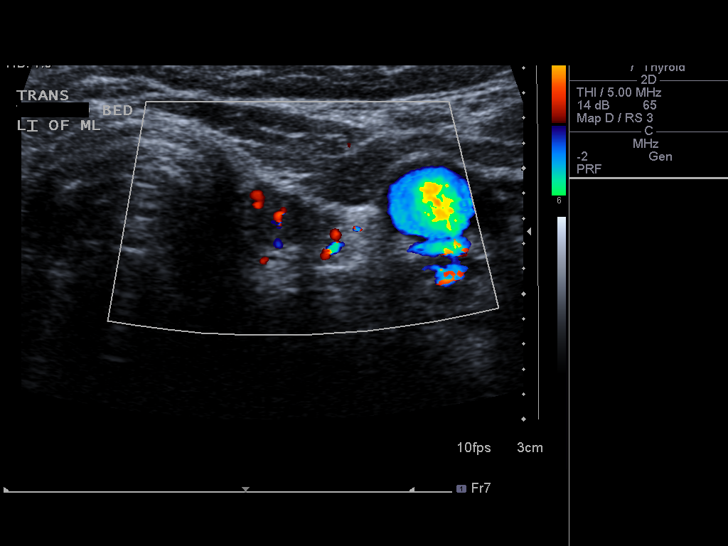
[im 13/20]
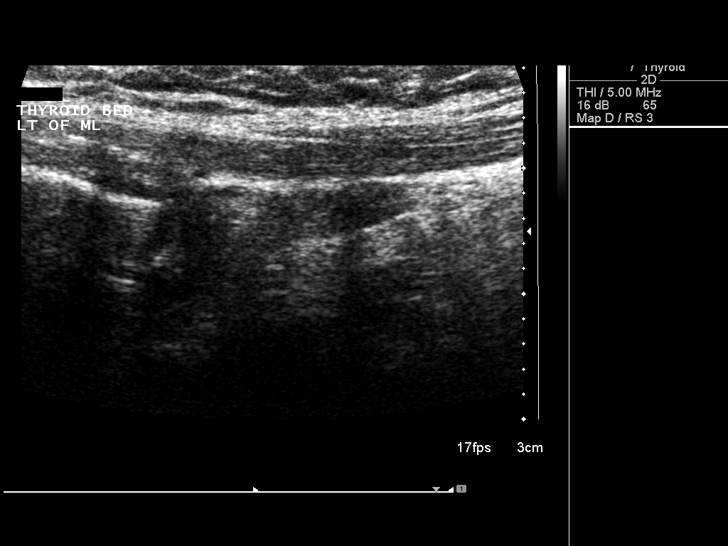
[im 14/20]
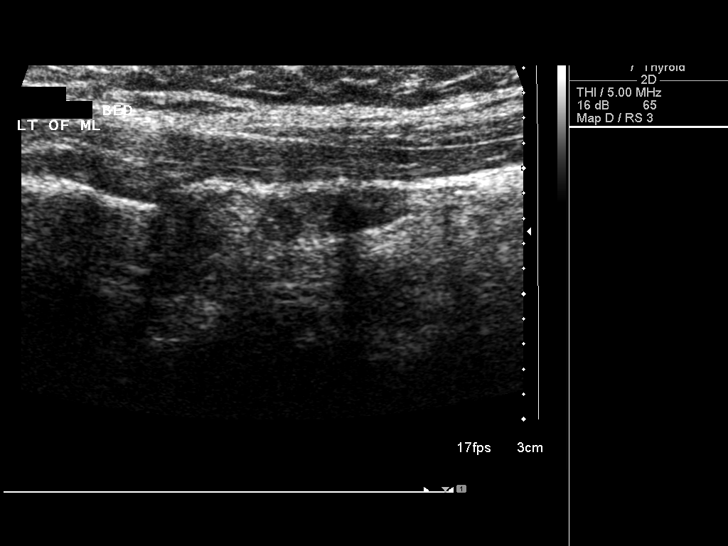
[im 16/20]
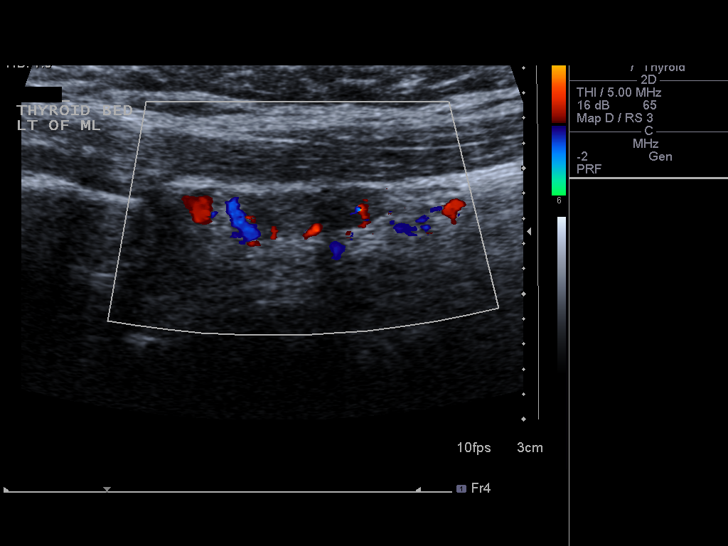
[im 17/20]
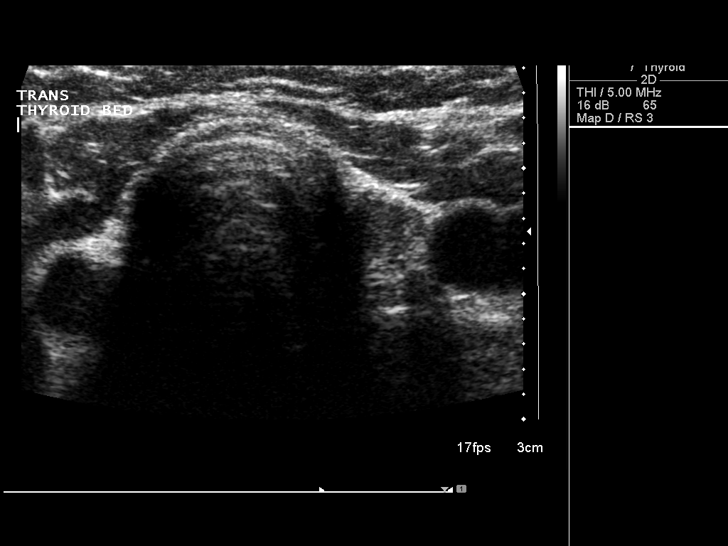
[im 18/20]
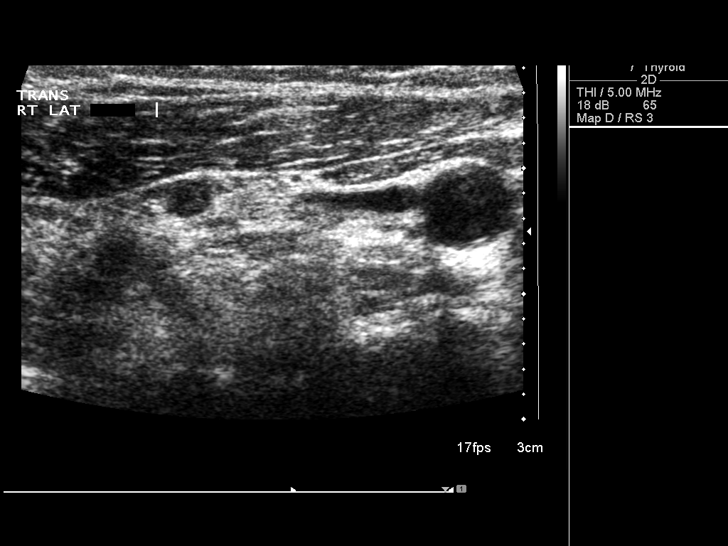
[im 20/20]
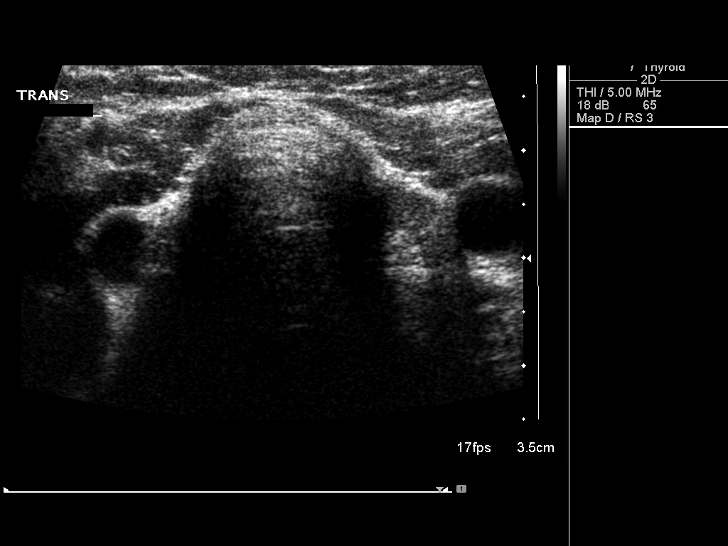

[14 of 20 positions shown; findings below may reference images not displayed]

FINDINGS: Right thyroid lobe

Surgically absent. There is no residual nodular soft tissue within
the right thyroidectomy bed.

Left thyroid lobe

Measurements: Diminutive measuring 1.3 x 0.4 x 0.8 cm.

There is no definitive nodule or mass within the remaining
diminutive left lobe of the thyroid.

Isthmus

Surgically absent.

Lymphadenopathy

None visualized.
IMPRESSION: Post subtotal thyroidectomy with very minimal amount of residual
left-sided thyroid tissue. There is no definitive nodule or mass
within the remaining diminutive left sided thyroidal tissue.

## 2015-10-27 DIAGNOSIS — R739 Hyperglycemia, unspecified: Secondary | ICD-10-CM | POA: Diagnosis not present

## 2015-10-27 DIAGNOSIS — E7800 Pure hypercholesterolemia, unspecified: Secondary | ICD-10-CM | POA: Diagnosis not present

## 2015-10-31 DIAGNOSIS — E785 Hyperlipidemia, unspecified: Secondary | ICD-10-CM | POA: Diagnosis not present

## 2015-10-31 DIAGNOSIS — E119 Type 2 diabetes mellitus without complications: Secondary | ICD-10-CM | POA: Diagnosis not present

## 2015-10-31 DIAGNOSIS — E890 Postprocedural hypothyroidism: Secondary | ICD-10-CM | POA: Diagnosis not present

## 2015-11-14 DIAGNOSIS — R0602 Shortness of breath: Secondary | ICD-10-CM | POA: Diagnosis not present

## 2015-11-14 DIAGNOSIS — J45909 Unspecified asthma, uncomplicated: Secondary | ICD-10-CM | POA: Diagnosis not present

## 2015-11-28 DIAGNOSIS — H938X1 Other specified disorders of right ear: Secondary | ICD-10-CM | POA: Diagnosis not present

## 2015-11-28 DIAGNOSIS — H6121 Impacted cerumen, right ear: Secondary | ICD-10-CM | POA: Diagnosis not present

## 2015-12-29 DIAGNOSIS — G2581 Restless legs syndrome: Secondary | ICD-10-CM | POA: Diagnosis not present

## 2015-12-29 DIAGNOSIS — E785 Hyperlipidemia, unspecified: Secondary | ICD-10-CM | POA: Diagnosis not present

## 2015-12-29 DIAGNOSIS — R739 Hyperglycemia, unspecified: Secondary | ICD-10-CM | POA: Diagnosis not present

## 2015-12-29 DIAGNOSIS — E039 Hypothyroidism, unspecified: Secondary | ICD-10-CM | POA: Diagnosis not present

## 2016-01-03 DIAGNOSIS — R1013 Epigastric pain: Secondary | ICD-10-CM | POA: Diagnosis not present

## 2016-01-03 DIAGNOSIS — K219 Gastro-esophageal reflux disease without esophagitis: Secondary | ICD-10-CM | POA: Diagnosis not present

## 2016-01-03 DIAGNOSIS — K5190 Ulcerative colitis, unspecified, without complications: Secondary | ICD-10-CM | POA: Diagnosis not present

## 2016-01-03 DIAGNOSIS — E669 Obesity, unspecified: Secondary | ICD-10-CM | POA: Diagnosis not present

## 2016-01-26 DIAGNOSIS — R51 Headache: Secondary | ICD-10-CM | POA: Diagnosis not present

## 2016-01-26 DIAGNOSIS — E039 Hypothyroidism, unspecified: Secondary | ICD-10-CM | POA: Diagnosis not present

## 2016-02-28 ENCOUNTER — Encounter: Payer: Self-pay | Admitting: Skilled Nursing Facility1

## 2016-02-28 ENCOUNTER — Encounter: Payer: BLUE CROSS/BLUE SHIELD | Attending: Endocrinology | Admitting: Skilled Nursing Facility1

## 2016-02-28 DIAGNOSIS — E119 Type 2 diabetes mellitus without complications: Secondary | ICD-10-CM | POA: Diagnosis not present

## 2016-02-28 DIAGNOSIS — Z713 Dietary counseling and surveillance: Secondary | ICD-10-CM | POA: Diagnosis not present

## 2016-02-28 NOTE — Progress Notes (Signed)

## 2016-03-06 ENCOUNTER — Encounter: Payer: BLUE CROSS/BLUE SHIELD | Admitting: Dietician

## 2016-03-06 DIAGNOSIS — E119 Type 2 diabetes mellitus without complications: Secondary | ICD-10-CM | POA: Diagnosis not present

## 2016-03-06 DIAGNOSIS — Z713 Dietary counseling and surveillance: Secondary | ICD-10-CM | POA: Diagnosis not present

## 2016-03-08 NOTE — Progress Notes (Signed)
Patient was seen on 8/15/17for the second of a series of three diabetes self-management courses at the [facility]. The following learning objectives were met by the patient during this class:   Describe the role of different macronutrients on glucose  Explain how carbohydrates affect blood glucose  State what foods contain the most carbohydrates  Demonstrate carbohydrate counting  Demonstrate how to read Nutrition Facts food label  Describe effects of various fats on heart health  Describe the importance of good nutrition for health and healthy eating strategies  Describe techniques for managing your shopping, cooking and meal planning  List strategies to follow meal plan when dining out  Describe the effects of alcohol on glucose and how to use it safely  Goals:  Follow Diabetes Meal Plan as instructed  Eat 3 meals and 2 snacks, every 3-5 hrs  Limit carbohydrate intake to 45 grams carbohydrate/meal Limit carbohydrate intake to 15 grams carbohydrate/snack Add lean protein foods to meals/snacks  Monitor glucose levels as instructed by your doctor   Follow-Up Plan:  Attend Core 3  Work towards following your personal food plan.   

## 2016-03-13 ENCOUNTER — Ambulatory Visit: Payer: BLUE CROSS/BLUE SHIELD | Admitting: Skilled Nursing Facility1

## 2016-04-12 DIAGNOSIS — M542 Cervicalgia: Secondary | ICD-10-CM | POA: Diagnosis not present

## 2016-04-12 DIAGNOSIS — H9201 Otalgia, right ear: Secondary | ICD-10-CM | POA: Diagnosis not present

## 2016-04-18 DIAGNOSIS — M25511 Pain in right shoulder: Secondary | ICD-10-CM | POA: Diagnosis not present

## 2016-04-18 DIAGNOSIS — M533 Sacrococcygeal disorders, not elsewhere classified: Secondary | ICD-10-CM | POA: Diagnosis not present

## 2016-04-18 DIAGNOSIS — Z23 Encounter for immunization: Secondary | ICD-10-CM | POA: Diagnosis not present

## 2016-04-18 DIAGNOSIS — M353 Polymyalgia rheumatica: Secondary | ICD-10-CM | POA: Diagnosis not present

## 2016-04-18 DIAGNOSIS — M359 Systemic involvement of connective tissue, unspecified: Secondary | ICD-10-CM | POA: Diagnosis not present

## 2016-04-24 DIAGNOSIS — R079 Chest pain, unspecified: Secondary | ICD-10-CM | POA: Diagnosis not present

## 2016-04-24 DIAGNOSIS — R194 Change in bowel habit: Secondary | ICD-10-CM | POA: Diagnosis not present

## 2016-04-24 DIAGNOSIS — K5904 Chronic idiopathic constipation: Secondary | ICD-10-CM | POA: Diagnosis not present

## 2016-04-24 DIAGNOSIS — R140 Abdominal distension (gaseous): Secondary | ICD-10-CM | POA: Diagnosis not present

## 2016-04-24 DIAGNOSIS — E559 Vitamin D deficiency, unspecified: Secondary | ICD-10-CM | POA: Diagnosis not present

## 2016-05-07 DIAGNOSIS — E119 Type 2 diabetes mellitus without complications: Secondary | ICD-10-CM | POA: Diagnosis not present

## 2016-05-10 DIAGNOSIS — B079 Viral wart, unspecified: Secondary | ICD-10-CM | POA: Diagnosis not present

## 2016-05-15 ENCOUNTER — Ambulatory Visit: Payer: BLUE CROSS/BLUE SHIELD

## 2016-05-23 DIAGNOSIS — R300 Dysuria: Secondary | ICD-10-CM | POA: Diagnosis not present

## 2016-05-23 DIAGNOSIS — R002 Palpitations: Secondary | ICD-10-CM | POA: Diagnosis not present

## 2016-05-23 DIAGNOSIS — R42 Dizziness and giddiness: Secondary | ICD-10-CM | POA: Diagnosis not present

## 2016-05-28 ENCOUNTER — Other Ambulatory Visit: Payer: Self-pay | Admitting: Family

## 2016-05-28 DIAGNOSIS — R42 Dizziness and giddiness: Secondary | ICD-10-CM

## 2016-05-29 ENCOUNTER — Ambulatory Visit (HOSPITAL_COMMUNITY)
Admission: RE | Admit: 2016-05-29 | Discharge: 2016-05-29 | Disposition: A | Discharge status: Home / Self Care | Payer: BLUE CROSS/BLUE SHIELD | Source: Ambulatory Visit | Attending: Cardiovascular Disease | Admitting: Cardiovascular Disease

## 2016-05-29 DIAGNOSIS — R42 Dizziness and giddiness: Secondary | ICD-10-CM | POA: Diagnosis not present

## 2016-05-29 DIAGNOSIS — R002 Palpitations: Secondary | ICD-10-CM | POA: Insufficient documentation

## 2016-06-04 DIAGNOSIS — B079 Viral wart, unspecified: Secondary | ICD-10-CM | POA: Diagnosis not present

## 2016-06-05 ENCOUNTER — Ambulatory Visit: Payer: BLUE CROSS/BLUE SHIELD

## 2016-06-05 DIAGNOSIS — J069 Acute upper respiratory infection, unspecified: Secondary | ICD-10-CM | POA: Diagnosis not present

## 2016-06-07 DIAGNOSIS — E039 Hypothyroidism, unspecified: Secondary | ICD-10-CM | POA: Diagnosis not present

## 2016-06-07 DIAGNOSIS — R300 Dysuria: Secondary | ICD-10-CM | POA: Diagnosis not present

## 2016-06-07 DIAGNOSIS — E119 Type 2 diabetes mellitus without complications: Secondary | ICD-10-CM | POA: Diagnosis not present

## 2016-06-07 DIAGNOSIS — E785 Hyperlipidemia, unspecified: Secondary | ICD-10-CM | POA: Diagnosis not present

## 2016-06-07 DIAGNOSIS — E559 Vitamin D deficiency, unspecified: Secondary | ICD-10-CM | POA: Diagnosis not present

## 2016-06-12 ENCOUNTER — Ambulatory Visit (HOSPITAL_COMMUNITY): Payer: BLUE CROSS/BLUE SHIELD | Attending: Cardiovascular Disease

## 2016-06-12 ENCOUNTER — Other Ambulatory Visit: Payer: Self-pay | Admitting: Internal Medicine

## 2016-06-12 ENCOUNTER — Other Ambulatory Visit: Payer: Self-pay

## 2016-06-12 DIAGNOSIS — R002 Palpitations: Secondary | ICD-10-CM

## 2016-06-13 DIAGNOSIS — E785 Hyperlipidemia, unspecified: Secondary | ICD-10-CM | POA: Diagnosis not present

## 2016-06-13 DIAGNOSIS — E119 Type 2 diabetes mellitus without complications: Secondary | ICD-10-CM | POA: Diagnosis not present

## 2016-06-13 DIAGNOSIS — E890 Postprocedural hypothyroidism: Secondary | ICD-10-CM | POA: Diagnosis not present

## 2016-06-20 DIAGNOSIS — E785 Hyperlipidemia, unspecified: Secondary | ICD-10-CM | POA: Diagnosis not present

## 2016-06-20 DIAGNOSIS — E890 Postprocedural hypothyroidism: Secondary | ICD-10-CM | POA: Diagnosis not present

## 2016-06-20 DIAGNOSIS — Z0000 Encounter for general adult medical examination without abnormal findings: Secondary | ICD-10-CM | POA: Diagnosis not present

## 2016-06-20 DIAGNOSIS — E119 Type 2 diabetes mellitus without complications: Secondary | ICD-10-CM | POA: Diagnosis not present

## 2016-06-26 ENCOUNTER — Ambulatory Visit: Payer: BLUE CROSS/BLUE SHIELD

## 2016-06-29 DIAGNOSIS — B079 Viral wart, unspecified: Secondary | ICD-10-CM | POA: Diagnosis not present

## 2016-07-27 DIAGNOSIS — B079 Viral wart, unspecified: Secondary | ICD-10-CM | POA: Diagnosis not present

## 2016-08-15 DIAGNOSIS — R040 Epistaxis: Secondary | ICD-10-CM | POA: Diagnosis not present

## 2016-08-15 DIAGNOSIS — H60391 Other infective otitis externa, right ear: Secondary | ICD-10-CM | POA: Diagnosis not present

## 2016-08-15 DIAGNOSIS — H903 Sensorineural hearing loss, bilateral: Secondary | ICD-10-CM | POA: Diagnosis not present

## 2016-08-23 DIAGNOSIS — H9201 Otalgia, right ear: Secondary | ICD-10-CM | POA: Diagnosis not present

## 2016-08-23 DIAGNOSIS — B9689 Other specified bacterial agents as the cause of diseases classified elsewhere: Secondary | ICD-10-CM | POA: Diagnosis not present

## 2016-08-23 DIAGNOSIS — J019 Acute sinusitis, unspecified: Secondary | ICD-10-CM | POA: Diagnosis not present

## 2016-08-23 DIAGNOSIS — R52 Pain, unspecified: Secondary | ICD-10-CM | POA: Diagnosis not present

## 2016-09-06 DIAGNOSIS — R040 Epistaxis: Secondary | ICD-10-CM | POA: Diagnosis not present

## 2016-09-06 DIAGNOSIS — M2669 Other specified disorders of temporomandibular joint: Secondary | ICD-10-CM | POA: Diagnosis not present

## 2016-09-06 DIAGNOSIS — Z8669 Personal history of other diseases of the nervous system and sense organs: Secondary | ICD-10-CM | POA: Diagnosis not present

## 2016-09-13 DIAGNOSIS — B079 Viral wart, unspecified: Secondary | ICD-10-CM | POA: Diagnosis not present

## 2016-09-13 DIAGNOSIS — E890 Postprocedural hypothyroidism: Secondary | ICD-10-CM | POA: Diagnosis not present

## 2016-09-17 DIAGNOSIS — M79643 Pain in unspecified hand: Secondary | ICD-10-CM | POA: Diagnosis not present

## 2016-09-17 DIAGNOSIS — M25519 Pain in unspecified shoulder: Secondary | ICD-10-CM | POA: Diagnosis not present

## 2016-09-17 DIAGNOSIS — M2550 Pain in unspecified joint: Secondary | ICD-10-CM | POA: Diagnosis not present

## 2016-09-17 DIAGNOSIS — K5190 Ulcerative colitis, unspecified, without complications: Secondary | ICD-10-CM | POA: Diagnosis not present

## 2016-10-15 DIAGNOSIS — L259 Unspecified contact dermatitis, unspecified cause: Secondary | ICD-10-CM | POA: Diagnosis not present

## 2016-10-15 DIAGNOSIS — B079 Viral wart, unspecified: Secondary | ICD-10-CM | POA: Diagnosis not present

## 2016-10-16 DIAGNOSIS — Z01419 Encounter for gynecological examination (general) (routine) without abnormal findings: Secondary | ICD-10-CM | POA: Diagnosis not present

## 2016-10-16 DIAGNOSIS — Z1211 Encounter for screening for malignant neoplasm of colon: Secondary | ICD-10-CM | POA: Diagnosis not present

## 2016-10-16 DIAGNOSIS — Z6834 Body mass index (BMI) 34.0-34.9, adult: Secondary | ICD-10-CM | POA: Diagnosis not present

## 2016-10-16 DIAGNOSIS — Z1231 Encounter for screening mammogram for malignant neoplasm of breast: Secondary | ICD-10-CM | POA: Diagnosis not present

## 2016-10-16 DIAGNOSIS — Z113 Encounter for screening for infections with a predominantly sexual mode of transmission: Secondary | ICD-10-CM | POA: Diagnosis not present

## 2016-11-16 DIAGNOSIS — M2550 Pain in unspecified joint: Secondary | ICD-10-CM | POA: Diagnosis not present

## 2016-11-16 DIAGNOSIS — M79643 Pain in unspecified hand: Secondary | ICD-10-CM | POA: Diagnosis not present

## 2016-11-16 DIAGNOSIS — E890 Postprocedural hypothyroidism: Secondary | ICD-10-CM | POA: Diagnosis not present

## 2016-11-16 DIAGNOSIS — M25519 Pain in unspecified shoulder: Secondary | ICD-10-CM | POA: Diagnosis not present

## 2016-11-16 DIAGNOSIS — K5190 Ulcerative colitis, unspecified, without complications: Secondary | ICD-10-CM | POA: Diagnosis not present

## 2016-11-20 DIAGNOSIS — M2550 Pain in unspecified joint: Secondary | ICD-10-CM | POA: Diagnosis not present

## 2016-11-20 DIAGNOSIS — M79644 Pain in right finger(s): Secondary | ICD-10-CM | POA: Diagnosis not present

## 2016-11-20 DIAGNOSIS — M25519 Pain in unspecified shoulder: Secondary | ICD-10-CM | POA: Diagnosis not present

## 2016-11-20 DIAGNOSIS — K5190 Ulcerative colitis, unspecified, without complications: Secondary | ICD-10-CM | POA: Diagnosis not present

## 2016-11-20 DIAGNOSIS — M79643 Pain in unspecified hand: Secondary | ICD-10-CM | POA: Diagnosis not present

## 2016-11-22 ENCOUNTER — Ambulatory Visit (INDEPENDENT_AMBULATORY_CARE_PROVIDER_SITE_OTHER): Payer: Self-pay | Admitting: Orthopedic Surgery

## 2016-11-27 DIAGNOSIS — E669 Obesity, unspecified: Secondary | ICD-10-CM | POA: Diagnosis not present

## 2016-11-27 DIAGNOSIS — K5190 Ulcerative colitis, unspecified, without complications: Secondary | ICD-10-CM | POA: Diagnosis not present

## 2016-11-27 DIAGNOSIS — R140 Abdominal distension (gaseous): Secondary | ICD-10-CM | POA: Diagnosis not present

## 2016-11-27 DIAGNOSIS — K5904 Chronic idiopathic constipation: Secondary | ICD-10-CM | POA: Diagnosis not present

## 2016-11-29 ENCOUNTER — Ambulatory Visit (INDEPENDENT_AMBULATORY_CARE_PROVIDER_SITE_OTHER): Payer: BLUE CROSS/BLUE SHIELD | Admitting: Orthopedic Surgery

## 2016-11-29 ENCOUNTER — Encounter (INDEPENDENT_AMBULATORY_CARE_PROVIDER_SITE_OTHER): Payer: Self-pay | Admitting: Orthopedic Surgery

## 2016-11-29 VITALS — Ht 60.0 in | Wt 161.0 lb

## 2016-11-29 DIAGNOSIS — M7541 Impingement syndrome of right shoulder: Secondary | ICD-10-CM | POA: Diagnosis not present

## 2016-11-29 MED ORDER — METHYLPREDNISOLONE ACETATE 40 MG/ML IJ SUSP
40.0000 mg | INTRAMUSCULAR | Status: AC | PRN
  Administered 2016-11-29: 40 mg via INTRA_ARTICULAR

## 2016-11-29 MED ORDER — LIDOCAINE HCL 1 % IJ SOLN
5.0000 mL | INTRAMUSCULAR | Status: AC | PRN
  Administered 2016-11-29: 5 mL

## 2016-11-29 NOTE — Progress Notes (Signed)
Office Visit Note   Patient: Danielle Bond           Date of Birth: 01-10-1961           MRN: [id number] Visit Date: 11/29/2016              Requested by: Kennyth Arnold, [place],  [id number] PCP: Patient, No Pcp Per  Chief Complaint  Patient presents with  . Right Shoulder - Pain      HPI: Patient is a 56 year old woman who is seen for initial evaluation or impingement symptoms of her right shoulder. Patient states that she had an injection in her biceps which did help. She states she's had symptoms on and off for a couple years she states the pain is primarily anteriorly over the biceps and feels it is not related to her shoulder. Patient states the pain is worse with changes in weather has decreased range of motion of the right shoulder. Past medical history is updated note patient does have early onset symptoms consistent with diabetes with blood sugars in the morning of 1:30. She also has a history of acid reflux asthma migraines and thyroid problems. She states she has a history of Graves' disease kidney stones and ulcerative colitis.  Assessment & Plan: Visit Diagnoses:  1. Impingement syndrome of right shoulder     Plan: Right shoulder was injected and tolerated this well recommended proper diet for control and prevention of diabetes. She will call if she is still symptomatic and would obtain an MRI scan of the rotator cuff to see if there is rotator cuff pathology that would require surgical intervention.  Follow-Up Instructions: Return if symptoms worsen or fail to improve.   Ortho Exam  Patient is alert, oriented, no adenopathy, well-dressed, normal affect, normal respiratory effort. Examination patient has good range of motion of her cervical spine and thoracic outlet is nontender she has decreased range of motion of the right shoulder compared to the left. She has pain with Neer and Hawkins impingement test pain with drop arm test. The biceps  tendon is tender to palpation.  Imaging: No results found.  Labs: Lab Results  Component Value Date   ESRSEDRATE 25 (H) 03/16/2014   REPTSTATUS 03/12/2014 FINAL 03/10/2014   CULT  03/10/2014    Multiple bacterial morphotypes present, none predominant. Suggest appropriate recollection if clinically indicated. Performed at [place] 03/22/2014    Orders:  No orders of the defined types were placed in this encounter.  No orders of the defined types were placed in this encounter.    Procedures: Large Joint Inj Date/Time: 11/29/2016 10:09 AM Performed by: Rebacca Votaw V Authorized by: Newt Minion   Consent Given by:  Patient Site marked: the procedure site was marked   Timeout: prior to procedure the correct patient, procedure, and site was verified   Indications:  Pain and diagnostic evaluation Location:  Shoulder Site:  R subacromial bursa Prep: patient was prepped and draped in usual sterile fashion   Needle Size:  22 G Needle Length:  1.5 inches Approach:  Posterior Ultrasound Guidance: No   Fluoroscopic Guidance: No   Arthrogram: No   Medications:  5 mL lidocaine 1 %; 40 mg methylPREDNISolone acetate 40 MG/ML Aspiration Attempted: No   Patient tolerance:  Patient tolerated the procedure well with no immediate complications    Clinical Data: No additional findings.  ROS:  All other systems negative, except as noted  in the HPI. Review of Systems  Objective: Vital Signs: Ht 5' (1.524 m)   Wt 161 lb (73 kg)   LMP 03/03/2013   BMI 31.44 kg/m   Specialty Comments:  No specialty comments available.  PMFS History: Patient Active Problem List   Diagnosis Date Noted  . Impingement syndrome of right shoulder 11/29/2016  . Pure hypercholesterolemia 12/22/2013  . Universal ulcerative (chronic) colitis(556.6) 09/21/2012  . Hemorrhage of rectum and anus 09/21/2012  . Other abnormal blood chemistry 09/21/2012  . Autoimmune  hepatitis ([place]) 08/16/2012  . Hypokalemia 08/16/2012  . Transaminitis 08/15/2012  . History of anemia 03/31/2012  . Asthma 03/31/2012  . Osteopenia 03/31/2012  . Ulcerative colitis ([place]) 03/31/2012  . History of renal calculi 03/31/2012  . History of migraine 03/31/2012  . Mitral valve prolapse   . Migraine   . Orthostatic hypotension    Past Medical History:  Diagnosis Date  . Asthma   . Chronic kidney disease   . Kidney stone   . Kidney stone   . Migraine   . Mitral valve prolapse   . Orthostatic hypotension   . Ulcerative colitis ([place]) 03/31/2012    Family History  Problem Relation Age of Onset  . Cancer Paternal Aunt   . Heart disease Paternal Grandmother     Past Surgical History:  Procedure Laterality Date  . CESAREAN SECTION    . CHOLECYSTECTOMY    . COLONOSCOPY    . THYROIDECTOMY     Social History   Occupational History  . Not on file.   Social History Main Topics  . Smoking status: Never Smoker  . Smokeless tobacco: Never Used  . Alcohol use No  . Drug use: No  . Sexual activity: Yes

## 2016-12-10 DIAGNOSIS — E890 Postprocedural hypothyroidism: Secondary | ICD-10-CM | POA: Diagnosis not present

## 2016-12-10 DIAGNOSIS — E119 Type 2 diabetes mellitus without complications: Secondary | ICD-10-CM | POA: Diagnosis not present

## 2016-12-19 DIAGNOSIS — R51 Headache: Secondary | ICD-10-CM | POA: Diagnosis not present

## 2016-12-19 DIAGNOSIS — R5383 Other fatigue: Secondary | ICD-10-CM | POA: Diagnosis not present

## 2017-01-11 DIAGNOSIS — Z1211 Encounter for screening for malignant neoplasm of colon: Secondary | ICD-10-CM | POA: Diagnosis not present

## 2017-01-11 DIAGNOSIS — K6389 Other specified diseases of intestine: Secondary | ICD-10-CM | POA: Diagnosis not present

## 2017-01-11 DIAGNOSIS — K5190 Ulcerative colitis, unspecified, without complications: Secondary | ICD-10-CM | POA: Diagnosis not present

## 2017-01-11 DIAGNOSIS — K529 Noninfective gastroenteritis and colitis, unspecified: Secondary | ICD-10-CM | POA: Diagnosis not present

## 2017-02-22 DIAGNOSIS — M71571 Other bursitis, not elsewhere classified, right ankle and foot: Secondary | ICD-10-CM | POA: Diagnosis not present

## 2017-02-22 DIAGNOSIS — M722 Plantar fascial fibromatosis: Secondary | ICD-10-CM | POA: Diagnosis not present

## 2017-02-22 DIAGNOSIS — M7731 Calcaneal spur, right foot: Secondary | ICD-10-CM | POA: Diagnosis not present

## 2017-02-22 DIAGNOSIS — M71572 Other bursitis, not elsewhere classified, left ankle and foot: Secondary | ICD-10-CM | POA: Diagnosis not present

## 2017-02-22 DIAGNOSIS — M7732 Calcaneal spur, left foot: Secondary | ICD-10-CM | POA: Diagnosis not present

## 2017-02-27 DIAGNOSIS — B9689 Other specified bacterial agents as the cause of diseases classified elsewhere: Secondary | ICD-10-CM | POA: Diagnosis not present

## 2017-02-27 DIAGNOSIS — J329 Chronic sinusitis, unspecified: Secondary | ICD-10-CM | POA: Diagnosis not present

## 2017-03-01 DIAGNOSIS — M71572 Other bursitis, not elsewhere classified, left ankle and foot: Secondary | ICD-10-CM | POA: Diagnosis not present

## 2017-03-01 DIAGNOSIS — M71571 Other bursitis, not elsewhere classified, right ankle and foot: Secondary | ICD-10-CM | POA: Diagnosis not present

## 2017-03-01 DIAGNOSIS — M722 Plantar fascial fibromatosis: Secondary | ICD-10-CM | POA: Diagnosis not present

## 2017-03-18 DIAGNOSIS — B079 Viral wart, unspecified: Secondary | ICD-10-CM | POA: Diagnosis not present

## 2017-03-20 DIAGNOSIS — J320 Chronic maxillary sinusitis: Secondary | ICD-10-CM | POA: Diagnosis not present

## 2017-03-20 DIAGNOSIS — M2669 Other specified disorders of temporomandibular joint: Secondary | ICD-10-CM | POA: Diagnosis not present

## 2017-03-29 DIAGNOSIS — J301 Allergic rhinitis due to pollen: Secondary | ICD-10-CM | POA: Diagnosis not present

## 2017-03-29 DIAGNOSIS — J029 Acute pharyngitis, unspecified: Secondary | ICD-10-CM | POA: Diagnosis not present

## 2017-03-29 DIAGNOSIS — R0981 Nasal congestion: Secondary | ICD-10-CM | POA: Diagnosis not present

## 2017-03-29 DIAGNOSIS — J309 Allergic rhinitis, unspecified: Secondary | ICD-10-CM | POA: Diagnosis not present

## 2017-03-29 DIAGNOSIS — E7800 Pure hypercholesterolemia, unspecified: Secondary | ICD-10-CM | POA: Diagnosis not present

## 2017-04-03 DIAGNOSIS — R51 Headache: Secondary | ICD-10-CM | POA: Diagnosis not present

## 2017-04-03 DIAGNOSIS — J302 Other seasonal allergic rhinitis: Secondary | ICD-10-CM | POA: Diagnosis not present

## 2017-04-03 DIAGNOSIS — R6889 Other general symptoms and signs: Secondary | ICD-10-CM | POA: Diagnosis not present

## 2017-04-09 DIAGNOSIS — K754 Autoimmune hepatitis: Secondary | ICD-10-CM | POA: Diagnosis not present

## 2017-04-09 DIAGNOSIS — E669 Obesity, unspecified: Secondary | ICD-10-CM | POA: Diagnosis not present

## 2017-04-09 DIAGNOSIS — K5190 Ulcerative colitis, unspecified, without complications: Secondary | ICD-10-CM | POA: Diagnosis not present

## 2017-04-09 DIAGNOSIS — K219 Gastro-esophageal reflux disease without esophagitis: Secondary | ICD-10-CM | POA: Diagnosis not present

## 2017-05-01 DIAGNOSIS — M25552 Pain in left hip: Secondary | ICD-10-CM | POA: Diagnosis not present

## 2017-05-01 DIAGNOSIS — M25559 Pain in unspecified hip: Secondary | ICD-10-CM | POA: Diagnosis not present

## 2017-05-01 DIAGNOSIS — K5190 Ulcerative colitis, unspecified, without complications: Secondary | ICD-10-CM | POA: Diagnosis not present

## 2017-05-01 DIAGNOSIS — M2550 Pain in unspecified joint: Secondary | ICD-10-CM | POA: Diagnosis not present

## 2017-05-01 DIAGNOSIS — M79643 Pain in unspecified hand: Secondary | ICD-10-CM | POA: Diagnosis not present

## 2017-05-01 DIAGNOSIS — M25519 Pain in unspecified shoulder: Secondary | ICD-10-CM | POA: Diagnosis not present

## 2017-05-01 DIAGNOSIS — M25551 Pain in right hip: Secondary | ICD-10-CM | POA: Diagnosis not present

## 2017-05-06 DIAGNOSIS — B079 Viral wart, unspecified: Secondary | ICD-10-CM | POA: Diagnosis not present

## 2017-05-08 DIAGNOSIS — E039 Hypothyroidism, unspecified: Secondary | ICD-10-CM | POA: Diagnosis not present

## 2017-05-16 ENCOUNTER — Other Ambulatory Visit: Payer: Self-pay | Admitting: *Deleted

## 2017-05-16 DIAGNOSIS — I6523 Occlusion and stenosis of bilateral carotid arteries: Secondary | ICD-10-CM

## 2017-05-23 DIAGNOSIS — M810 Age-related osteoporosis without current pathological fracture: Secondary | ICD-10-CM | POA: Diagnosis not present

## 2017-05-23 DIAGNOSIS — M2550 Pain in unspecified joint: Secondary | ICD-10-CM | POA: Diagnosis not present

## 2017-05-23 DIAGNOSIS — M25519 Pain in unspecified shoulder: Secondary | ICD-10-CM | POA: Diagnosis not present

## 2017-05-23 DIAGNOSIS — K5190 Ulcerative colitis, unspecified, without complications: Secondary | ICD-10-CM | POA: Diagnosis not present

## 2017-05-23 DIAGNOSIS — M79643 Pain in unspecified hand: Secondary | ICD-10-CM | POA: Diagnosis not present

## 2017-06-06 DIAGNOSIS — M79643 Pain in unspecified hand: Secondary | ICD-10-CM | POA: Diagnosis not present

## 2017-06-06 DIAGNOSIS — S6991XA Unspecified injury of right wrist, hand and finger(s), initial encounter: Secondary | ICD-10-CM | POA: Diagnosis not present

## 2017-06-06 DIAGNOSIS — M7531 Calcific tendinitis of right shoulder: Secondary | ICD-10-CM | POA: Diagnosis not present

## 2017-06-06 DIAGNOSIS — M25559 Pain in unspecified hip: Secondary | ICD-10-CM | POA: Diagnosis not present

## 2017-06-06 DIAGNOSIS — M2550 Pain in unspecified joint: Secondary | ICD-10-CM | POA: Diagnosis not present

## 2017-06-06 DIAGNOSIS — K5190 Ulcerative colitis, unspecified, without complications: Secondary | ICD-10-CM | POA: Diagnosis not present

## 2017-06-17 DIAGNOSIS — E119 Type 2 diabetes mellitus without complications: Secondary | ICD-10-CM | POA: Diagnosis not present

## 2017-06-17 DIAGNOSIS — Z0000 Encounter for general adult medical examination without abnormal findings: Secondary | ICD-10-CM | POA: Diagnosis not present

## 2017-06-17 DIAGNOSIS — E785 Hyperlipidemia, unspecified: Secondary | ICD-10-CM | POA: Diagnosis not present

## 2017-06-17 DIAGNOSIS — E890 Postprocedural hypothyroidism: Secondary | ICD-10-CM | POA: Diagnosis not present

## 2017-06-27 ENCOUNTER — Inpatient Hospital Stay (HOSPITAL_COMMUNITY): Admission: RE | Admit: 2017-06-27 | Payer: BLUE CROSS/BLUE SHIELD | Source: Ambulatory Visit

## 2017-07-03 ENCOUNTER — Ambulatory Visit (HOSPITAL_COMMUNITY)
Admission: RE | Admit: 2017-07-03 | Payer: BLUE CROSS/BLUE SHIELD | Source: Ambulatory Visit | Attending: Family | Admitting: Family

## 2017-07-04 ENCOUNTER — Ambulatory Visit (HOSPITAL_COMMUNITY)
Admission: RE | Admit: 2017-07-04 | Discharge: 2017-07-04 | Disposition: A | Discharge status: Home / Self Care | Payer: BLUE CROSS/BLUE SHIELD | Source: Ambulatory Visit | Attending: Internal Medicine | Admitting: Internal Medicine

## 2017-07-04 DIAGNOSIS — I6523 Occlusion and stenosis of bilateral carotid arteries: Secondary | ICD-10-CM

## 2017-07-09 DIAGNOSIS — R202 Paresthesia of skin: Secondary | ICD-10-CM | POA: Diagnosis not present

## 2017-07-09 DIAGNOSIS — Z0000 Encounter for general adult medical examination without abnormal findings: Secondary | ICD-10-CM | POA: Diagnosis not present

## 2017-07-09 DIAGNOSIS — R200 Anesthesia of skin: Secondary | ICD-10-CM | POA: Diagnosis not present

## 2017-07-10 ENCOUNTER — Other Ambulatory Visit: Payer: Self-pay | Admitting: Internal Medicine

## 2017-07-10 DIAGNOSIS — R202 Paresthesia of skin: Secondary | ICD-10-CM

## 2017-07-12 DIAGNOSIS — E890 Postprocedural hypothyroidism: Secondary | ICD-10-CM | POA: Diagnosis not present

## 2017-07-12 DIAGNOSIS — E119 Type 2 diabetes mellitus without complications: Secondary | ICD-10-CM | POA: Diagnosis not present

## 2017-07-12 DIAGNOSIS — E785 Hyperlipidemia, unspecified: Secondary | ICD-10-CM | POA: Diagnosis not present

## 2017-07-15 ENCOUNTER — Emergency Department (HOSPITAL_COMMUNITY)
Admission: EM | Admit: 2017-07-15 | Discharge: 2017-07-15 | Disposition: A | Discharge status: Home / Self Care | Payer: BLUE CROSS/BLUE SHIELD | Attending: Emergency Medicine | Admitting: Emergency Medicine

## 2017-07-15 ENCOUNTER — Encounter (HOSPITAL_COMMUNITY): Payer: Self-pay | Admitting: Emergency Medicine

## 2017-07-15 ENCOUNTER — Other Ambulatory Visit: Payer: Self-pay

## 2017-07-15 ENCOUNTER — Emergency Department (HOSPITAL_COMMUNITY): Payer: BLUE CROSS/BLUE SHIELD

## 2017-07-15 DIAGNOSIS — J45909 Unspecified asthma, uncomplicated: Secondary | ICD-10-CM | POA: Insufficient documentation

## 2017-07-15 DIAGNOSIS — R0789 Other chest pain: Secondary | ICD-10-CM

## 2017-07-15 DIAGNOSIS — R079 Chest pain, unspecified: Secondary | ICD-10-CM | POA: Diagnosis not present

## 2017-07-15 DIAGNOSIS — Z79899 Other long term (current) drug therapy: Secondary | ICD-10-CM | POA: Diagnosis not present

## 2017-07-15 DIAGNOSIS — K2970 Gastritis, unspecified, without bleeding: Secondary | ICD-10-CM | POA: Diagnosis not present

## 2017-07-15 DIAGNOSIS — N189 Chronic kidney disease, unspecified: Secondary | ICD-10-CM | POA: Insufficient documentation

## 2017-07-15 LAB — CBC
HCT: 38.8 % (ref 36.0–46.0)
Hemoglobin: 12.8 g/dL (ref 12.0–15.0)
MCH: 29.9 pg (ref 26.0–34.0)
MCHC: 33 g/dL (ref 30.0–36.0)
MCV: 90.7 fL (ref 78.0–100.0)
PLATELETS: 289 10*3/uL (ref 150–400)
RBC: 4.28 MIL/uL (ref 3.87–5.11)
RDW: 12.6 % (ref 11.5–15.5)
WBC: 8.8 10*3/uL (ref 4.0–10.5)

## 2017-07-15 LAB — BASIC METABOLIC PANEL
Anion gap: 8 (ref 5–15)
BUN: 18 mg/dL (ref 6–20)
CALCIUM: 9 mg/dL (ref 8.9–10.3)
CO2: 25 mmol/L (ref 22–32)
CREATININE: 0.89 mg/dL (ref 0.44–1.00)
Chloride: 107 mmol/L (ref 101–111)
GFR calc Af Amer: >60 mL/min (ref >60)
GFR calc non Af Amer: >60 mL/min (ref >60)
GLUCOSE: 126 mg/dL — AB (ref 65–99)
Potassium: 3.9 mmol/L (ref 3.5–5.1)
Sodium: 140 mmol/L (ref 135–145)

## 2017-07-15 LAB — HEPATIC FUNCTION PANEL
ALK PHOS: 58 U/L (ref 38–126)
ALT: 30 U/L (ref 14–54)
AST: 35 U/L (ref 15–41)
Albumin: 4 g/dL (ref 3.5–5.0)
BILIRUBIN TOTAL: 0.5 mg/dL (ref 0.3–1.2)
Bilirubin, Direct: <0.1 mg/dL — ABNORMAL LOW (ref 0.1–0.5)
TOTAL PROTEIN: 7.4 g/dL (ref 6.5–8.1)

## 2017-07-15 LAB — I-STAT TROPONIN, ED
TROPONIN I, POC: 0 ng/mL (ref 0.00–0.08)
TROPONIN I, POC: 0 ng/mL (ref 0.00–0.08)

## 2017-07-15 LAB — I-STAT BETA HCG BLOOD, ED (MC, WL, AP ONLY): I-stat hCG, quantitative: <5 m[IU]/mL (ref <5)

## 2017-07-15 LAB — LIPASE, BLOOD: Lipase: 49 U/L (ref 11–51)

## 2017-07-15 MED ORDER — GI COCKTAIL ~~LOC~~
30.0000 mL | Freq: Once | ORAL | Status: AC
  Administered 2017-07-15: 30 mL via ORAL
  Filled 2017-07-15: qty 30

## 2017-07-15 MED ORDER — SUCRALFATE 1 G PO TABS: 1.0000 g | ORAL_TABLET | Freq: Three times a day (TID) | ORAL | 0 refills | Status: DC

## 2017-07-15 MED ORDER — OMEPRAZOLE 20 MG PO CPDR: 20.0000 mg | DELAYED_RELEASE_CAPSULE | Freq: Every day | ORAL | 0 refills | Status: DC

## 2017-07-15 NOTE — Discharge Instructions (Signed)
Continue all your regular medications.  Take Carafate and Protonix as prescribed in addition.  Avoid any spicy or tomato based foods.  Avoid any NSAID medications.  Follow-up with your family doctor. Return if worsening symptoms.

## 2017-07-15 NOTE — ED Provider Notes (Signed)
[facility] DEPT Provider Note   CSN: [id number] Arrival date & time: 07/15/17  0037     History   Chief Complaint Chief Complaint  Patient presents with  . Chest Pain    HPI Danielle Bond is a 56 y.o. female.  HPI Danielle Bond is a 56 y.o. female with history of asthma, mitral valve prolapse, migraine headache, ulcerative colitis, pancreatitis, presents to emergency department complaining of upper abdominal/chest pain and back pain.  Patient states her symptoms began 11 PM last night.  She denies any associated nausea or vomiting.  No changes in her bowels.  Denies any cough or congestion.  Denies any shortness of breath.  She states nothing is making her symptoms better or worse including eating and position.  No fever or chills.  She did not take any medications prior to coming in.  She reports similar pain in the past and states was diagnosed with pancreatitis.  Denies any cardiac history.  She reports being under a lot of stress recently, states that her mom passed away and her dad had a massive heart attack all within the last 2 months.  She also reports that she had a dental procedure done a week ago, where the removed a "cyst and put stitches."  Patient is currently taking clindamycin. Past Medical History:  Diagnosis Date  . Asthma   . Chronic kidney disease   . Kidney stone   . Kidney stone   . Migraine   . Mitral valve prolapse   . Orthostatic hypotension   . Ulcerative colitis ([place]) 03/31/2012    Patient Active Problem List   Diagnosis Date Noted  . Impingement syndrome of right shoulder 11/29/2016  . Pure hypercholesterolemia 12/22/2013  . Universal ulcerative (chronic) colitis(556.6) 09/21/2012  . Hemorrhage of rectum and anus 09/21/2012  . Other abnormal blood chemistry 09/21/2012  . Autoimmune hepatitis ([place]) 08/16/2012  . Hypokalemia 08/16/2012  . Transaminitis 08/15/2012  . History of anemia 03/31/2012  . Asthma 03/31/2012    . Osteopenia 03/31/2012  . Ulcerative colitis ([place]) 03/31/2012  . History of renal calculi 03/31/2012  . History of migraine 03/31/2012  . Mitral valve prolapse   . Migraine   . Orthostatic hypotension     Past Surgical History:  Procedure Laterality Date  . CESAREAN SECTION    . CHOLECYSTECTOMY    . COLONOSCOPY    . THYROIDECTOMY      OB History    Gravida Para Term Preterm AB Living   2       2 2    SAB TAB Ectopic Multiple Live Births   2               Home Medications    Prior to Admission medications   Medication Sig Start Date End Date Taking? Authorizing Provider  albuterol (PROVENTIL HFA;VENTOLIN HFA) 108 (90 Base) MCG/ACT inhaler Inhale 2 puffs into the lungs every 6 (six) hours as needed for wheezing or shortness of breath.   Yes [provider]  cholecalciferol (VITAMIN D) 1000 units tablet Take 1,000 Units by mouth daily.   Yes [provider]  clindamycin (CLEOCIN) 150 MG capsule Take 1 capsule by mouth 3 (three) times daily. 7 day course starting 07/11/17 07/11/17  Yes [provider]  CRESTOR 10 MG tablet Take 1 tablet by mouth daily. 08/12/14  Yes [provider]  folic acid (FOLVITE) 546 MCG tablet Take 800 mcg by mouth daily.   Yes [provider]  levothyroxine (SYNTHROID, LEVOTHROID) 100 MCG tablet Take 100 mcg by mouth daily before breakfast.   Yes [provider]  mesalamine (LIALDA) 1.2 G EC tablet Take 3.6 g by mouth daily with breakfast.    Yes [provider]  vitamin E 100 UNIT capsule Take 100 Units by mouth daily.   Yes [provider]    Family History Family History  Problem Relation Age of Onset  . Cancer Paternal Aunt   . Heart disease Paternal Grandmother     Social History Social History   Tobacco Use  . Smoking status: Never Smoker  . Smokeless tobacco: Never Used  Substance Use Topics  . Alcohol use: No  . Drug use: No     Allergies   Almond oil;  Other; Aspirin; Hydromorphone hcl; Penicillins; Acetaminophen; Corticosteroids; Imitrex [sumatriptan]; Stadol [butorphanol]; Vancomycin; Azathioprine; Pepcid [famotidine]; and Prednisone   Review of Systems Review of Systems  Constitutional: Negative for chills and fever.  Respiratory: Positive for chest tightness. Negative for cough and shortness of breath.   Cardiovascular: Positive for chest pain. Negative for palpitations and leg swelling.  Gastrointestinal: Positive for abdominal pain. Negative for diarrhea, nausea and vomiting.  Genitourinary: Negative for dysuria, flank pain, pelvic pain, vaginal bleeding, vaginal discharge and vaginal pain.  Musculoskeletal: Negative for arthralgias, myalgias, neck pain and neck stiffness.  Skin: Negative for rash.  Neurological: Negative for dizziness, weakness and headaches.  All other systems reviewed and are negative.    Physical Exam Updated Vital Signs BP 121/82 (BP Location: Left Arm)   Pulse 97   Temp 98.2 F (36.8 C) (Oral)   Resp (!) 28   LMP 03/03/2013   SpO2 99%   Physical Exam  Constitutional: She is oriented to person, place, and time. She appears well-developed and well-nourished. No distress.  HENT:  Head: Normocephalic and atraumatic.  Eyes: Conjunctivae and EOM are normal. Pupils are equal, round, and reactive to light.  Neck: Normal range of motion. Neck supple.  Cardiovascular: Normal rate, regular rhythm and normal heart sounds.  Pulmonary/Chest: Effort normal and breath sounds normal. No respiratory distress. She has no wheezes. She has no rales.  Abdominal: Soft. Bowel sounds are normal. She exhibits no distension. There is tenderness. There is no rebound.  Epigastric tenderness  Musculoskeletal: Normal range of motion. She exhibits no edema.  Neurological: She is alert and oriented to person, place, and time. No cranial nerve deficit. Coordination normal.  Skin: Skin is warm and dry.  Psychiatric: She has a  normal mood and affect. Her behavior is normal.  Nursing note and vitals reviewed.    ED Treatments / Results  Labs (all labs ordered are listed, but only abnormal results are displayed) Labs Reviewed  BASIC METABOLIC PANEL - Abnormal; Notable for the following components:      Result Value   Glucose, Bld 126 (*)    All other components within normal limits  CBC  LIPASE, BLOOD  HEPATIC FUNCTION PANEL  I-STAT TROPONIN, ED  I-STAT BETA HCG BLOOD, ED ([facility], [facility], [facility] ONLY)  I-STAT TROPONIN, ED    EKG  EKG Interpretation None       Radiology Dg Chest 2 View  Result Date: 07/15/2017 CLINICAL DATA:  Acute onset of generalized chest pressure, radiating to the back. EXAM: CHEST  2 VIEW COMPARISON:  Chest radiograph performed 04/25/2015 FINDINGS: The lungs are well-aerated and clear. There is no evidence of focal opacification, pleural effusion or pneumothorax. The heart is normal in size; the  mediastinal contour is within normal limits. No acute osseous abnormalities are seen. IMPRESSION: No acute cardiopulmonary process seen. Electronically Signed   By: Garald Balding M.D.   On: 07/15/2017 01:25    Procedures Procedures (including critical care time)  Medications Ordered in ED Medications  gi cocktail (Maalox,Lidocaine,Donnatal) (not administered)     Initial Impression / Assessment and Plan / ED Course  I have reviewed the triage vital signs and the nursing notes.  Pertinent labs & imaging results that were available during my care of the patient were reviewed by me and considered in my medical decision making (see chart for details).    Patient in emergency department with chest pain/epigastric pain onset last night.  Patient does have tenderness in the epigastric area and left upper quadrant.  Patient's vital signs are normal other than mild tachycardia.  Patient is tearful, reports recent stressors.  History of pancreatitis with similar pain.  Patient's labs already obtained  by triage nurse, basic metabolic panel was obtained.  Will add hepatic panel and lipase.  Labs are normal so far. CXR negative.   5:04 AM Second set of enzymes as well as lipase and hepatic function panel are negative.  Patient had pain relief with GI cocktail.  She states she feels much better.  I suspect her symptoms could be due to gastritis versus peptic ulcer disease.  Will add Carafate and PPI to her medications.  Her vital signs have been normal.  She is in no acute distress. Stable for dc home.  Follow-up with family doctor, return precautions discussed.  Vitals:   07/15/17 0256 07/15/17 0300 07/15/17 0400 07/15/17 0410  BP: 121/82 129/78 110/66   Pulse: 97 91 92   Resp: (!) 28 18 (!) 24   Temp: 98.2 F (36.8 C)     TempSrc: Oral     SpO2: 99% 97% 96%   Weight:    74.8 kg (165 lb)  Height:    5' (1.524 m)     Final Clinical Impressions(s) / ED Diagnoses   Final diagnoses:  Atypical chest pain  Gastritis without bleeding, unspecified chronicity, unspecified gastritis type    ED Discharge Orders    None       Jeannett Senior, PA-C 07/15/17 [place], Ankit, MD 07/16/17 [phone]

## 2017-07-15 NOTE — ED Triage Notes (Signed)
Patient here with complaints of chest pain. Reports feeling pressure on chest radiating into back that started 11pm. Denies n/v/d.

## 2017-07-20 ENCOUNTER — Ambulatory Visit
Admission: RE | Admit: 2017-07-20 | Discharge: 2017-07-20 | Disposition: A | Discharge status: Home / Self Care | Payer: BLUE CROSS/BLUE SHIELD | Source: Ambulatory Visit | Attending: Internal Medicine | Admitting: Internal Medicine

## 2017-07-20 DIAGNOSIS — R202 Paresthesia of skin: Secondary | ICD-10-CM

## 2017-07-20 DIAGNOSIS — R200 Anesthesia of skin: Secondary | ICD-10-CM | POA: Diagnosis not present

## 2017-08-03 NOTE — Progress Notes (Signed)
error 

## 2017-08-28 DIAGNOSIS — M71572 Other bursitis, not elsewhere classified, left ankle and foot: Secondary | ICD-10-CM | POA: Diagnosis not present

## 2017-08-28 DIAGNOSIS — M71571 Other bursitis, not elsewhere classified, right ankle and foot: Secondary | ICD-10-CM | POA: Diagnosis not present

## 2017-08-28 DIAGNOSIS — M722 Plantar fascial fibromatosis: Secondary | ICD-10-CM | POA: Diagnosis not present

## 2017-09-04 DIAGNOSIS — M71571 Other bursitis, not elsewhere classified, right ankle and foot: Secondary | ICD-10-CM | POA: Diagnosis not present

## 2017-09-04 DIAGNOSIS — M722 Plantar fascial fibromatosis: Secondary | ICD-10-CM | POA: Diagnosis not present

## 2017-09-04 DIAGNOSIS — M71572 Other bursitis, not elsewhere classified, left ankle and foot: Secondary | ICD-10-CM | POA: Diagnosis not present

## 2017-09-12 DIAGNOSIS — E890 Postprocedural hypothyroidism: Secondary | ICD-10-CM | POA: Diagnosis not present

## 2017-09-12 DIAGNOSIS — B079 Viral wart, unspecified: Secondary | ICD-10-CM | POA: Diagnosis not present

## 2017-09-17 ENCOUNTER — Ambulatory Visit (INDEPENDENT_AMBULATORY_CARE_PROVIDER_SITE_OTHER): Payer: BLUE CROSS/BLUE SHIELD | Admitting: Neurology

## 2017-09-17 ENCOUNTER — Encounter: Payer: Self-pay | Admitting: Neurology

## 2017-09-17 DIAGNOSIS — IMO0002 Reserved for concepts with insufficient information to code with codable children: Secondary | ICD-10-CM | POA: Insufficient documentation

## 2017-09-17 DIAGNOSIS — G43709 Chronic migraine without aura, not intractable, without status migrainosus: Secondary | ICD-10-CM | POA: Diagnosis not present

## 2017-09-17 MED ORDER — ONDANSETRON 4 MG PO TBDP: 4.0000 mg | ORAL_TABLET | Freq: Three times a day (TID) | ORAL | 6 refills | Status: DC | PRN

## 2017-09-17 MED ORDER — RIZATRIPTAN BENZOATE 10 MG PO TBDP: 10.0000 mg | ORAL_TABLET | ORAL | 6 refills | Status: DC | PRN

## 2017-09-17 MED ORDER — TOPIRAMATE 50 MG PO TABS: 50.0000 mg | ORAL_TABLET | Freq: Two times a day (BID) | ORAL | 11 refills | Status: DC

## 2017-09-17 NOTE — Patient Instructions (Addendum)
Magnesium  Along with vitamin B2 riboflavin 100 mg twice a day as migraine preventive medications  Topamax 35m twice a day as migraine prevention  Try Maxalt, zofran as needed during migraine headaches,

## 2017-09-17 NOTE — Progress Notes (Signed)
PATIENT: Danielle Bond DOB: Nov 28, 1960  Chief Complaint  Patient presents with  . Numbness    Reports having two days of significant on numbness, tingling and pressure on the right side of her head.  The numbness/tingling have resolved but the pressure is still present.  She has history of migraines but has not required medication for the last nine years.  She has also noticed blurred vision.  She has a pending appt with her eye doctor.  Marland Kitchen PCP    Jani Gravel, MD     HISTORICAL  Danielle Bond is a 57 year old female, seen in refer by  his primary care doctor, Jani Gravel for evaluation of numbness, initial evaluation was on September 17, 2017.  I reviewed and summarized the referring note, she has past medical history of hyperlipidemia, Graves' disease, status post thyroidectomy, ulcerative colitis, kidney stone, acute pancreatitis,  She has a long history of migraine headaches, previously responding well to beta-blocker Topamax as preventive medications, also take magnesium as headache prevention, reported significant side effect use Imitrex, causing rash.  Her headaches are overall under good control until October 2018, she began to have frequent migraine headaches again, she has migraine headache about 3-4 times each week, constant right-sided pressure headache with associated light noise sensitivity, nauseous, duing one intense headache, she also has visual distortion, seeing flashing light at her peripheral visual field.   This has lead to MRI of the brain that was normal in July 21, 2017.  REVIEW OF SYSTEMS: Full 14 system review of systems performed and notable only for blurred vision, headaches, constipation  ALLERGIES: Allergies  Allergen Reactions  . Almond Oil Hives and Shortness Of Breath  . Other Anaphylaxis    Almonds, tree nuts  . Aspirin Hives and Nausea Only    Stomach cramps  . Hydromorphone Hcl Other (See Comments)    Caused Enzyme levels to go up  .  Penicillins Hives and Swelling    Stomach cramps  . Acetaminophen Other (See Comments)    Has autoimmune hepatitis, wants to avoid APAP  . Corticosteroids Other (See Comments)    Had acute pancreatitis x 2 after steroid use.  . Imitrex [Sumatriptan] Swelling    Injection caused swelling  . Stadol [Butorphanol] Other (See Comments)    hallucinates  . Vancomycin Hives  . Azathioprine Rash  . Pepcid [Famotidine] Nausea And Vomiting    Makes acid worse  . Prednisone Nausea And Vomiting    All steroids causes nausea and pancreatitis     HOME MEDICATIONS: Current Outpatient Medications  Medication Sig Dispense Refill  . albuterol (PROVENTIL HFA;VENTOLIN HFA) 108 (90 Base) MCG/ACT inhaler Inhale 2 puffs into the lungs every 6 (six) hours as needed for wheezing or shortness of breath.    . cholecalciferol (VITAMIN D) 1000 units tablet Take 1,000 Units by mouth daily.    . clindamycin (CLEOCIN) 150 MG capsule Take by mouth as needed. Takes prior to dental procedures.  1  . CRESTOR 10 MG tablet Take 1 tablet by mouth daily.  1  . folic acid (FOLVITE) 446 MCG tablet Take 800 mcg by mouth daily.    Marland Kitchen levothyroxine (SYNTHROID, LEVOTHROID) 100 MCG tablet Take 100 mcg by mouth daily before breakfast.    . mesalamine (LIALDA) 1.2 G EC tablet Take 3.6 g by mouth daily with breakfast.     . vitamin E 100 UNIT capsule Take 100 Units by mouth daily.     No current facility-administered medications for this  visit.     PAST MEDICAL HISTORY: Past Medical History:  Diagnosis Date  . Asthma   . Chronic kidney disease   . Kidney stone   . Kidney stone   . Migraine   . Mitral valve prolapse   . Orthostatic hypotension   . Ulcerative colitis ([place]) 03/31/2012    PAST SURGICAL HISTORY: Past Surgical History:  Procedure Laterality Date  . CESAREAN SECTION    . CHOLECYSTECTOMY    . COLONOSCOPY    . THYROIDECTOMY      FAMILY HISTORY: Family History  Problem Relation Age of Onset  . Cervical  cancer Paternal Aunt   . Heart disease Paternal Grandmother   . Heart disease Mother   . Dementia Mother   . Thyroid disease Mother   . Osteoarthritis Mother   . Heart attack Father     SOCIAL HISTORY:  Social History   Socioeconomic History  . Marital status: Married    Spouse name: Not on file  . Number of children: 2  . Years of education: some college  . Highest education level: Not on file  Social Needs  . Financial resource strain: Not on file  . Food insecurity - worry: Not on file  . Food insecurity - inability: Not on file  . Transportation needs - medical: Not on file  . Transportation needs - non-medical: Not on file  Occupational History  . Occupation: Self-employed  Tobacco Use  . Smoking status: Never Smoker  . Smokeless tobacco: Never Used  Substance and Sexual Activity  . Alcohol use: No  . Drug use: No  . Sexual activity: Yes  Other Topics Concern  . Not on file  Social History Narrative   Lives at home with her husband.   Right-handed.   No caffeine use.     PHYSICAL EXAM   Vitals:   09/17/17 1002  BP: 135/89  Pulse: 95  Weight: 162 lb (73.5 kg)  Height: 5' (1.524 m)    Not recorded      Body mass index is 31.64 kg/m.  PHYSICAL EXAMNIATION:  Gen: NAD, conversant, well nourised, obese, well groomed                     Cardiovascular: Regular rate rhythm, no peripheral edema, warm, nontender. Eyes: Conjunctivae clear without exudates or hemorrhage Neck: Supple, no carotid bruits. Pulmonary: Clear to auscultation bilaterally   NEUROLOGICAL EXAM:  MENTAL STATUS: Speech:    Speech is normal; fluent and spontaneous with normal comprehension.  Cognition:     Orientation to time, place and person     Normal recent and remote memory     Normal Attention span and concentration     Normal Language, naming, repeating,spontaneous speech     Fund of knowledge   CRANIAL NERVES: CN II: Visual fields are full to confrontation.  Fundoscopic exam is normal with sharp discs and no vascular changes. Pupils are round equal and briskly reactive to light. CN III, IV, VI: extraocular movement are normal. No ptosis. CN V: Facial sensation is intact to pinprick in all 3 divisions bilaterally. Corneal responses are intact.  CN VII: Face is symmetric with normal eye closure and smile. CN VIII: Hearing is normal to rubbing fingers CN IX, X: Palate elevates symmetrically. Phonation is normal. CN XI: Head turning and shoulder shrug are intact CN XII: Tongue is midline with normal movements and no atrophy.  MOTOR: There is no pronator drift of out-stretched arms. Muscle bulk  and tone are normal. Muscle strength is normal.  REFLEXES: Reflexes are 2+ and symmetric at the biceps, triceps, knees, and ankles. Plantar responses are flexor.  SENSORY: Intact to light touch, pinprick, positional sensation and vibratory sensation are intact in fingers and toes.  COORDINATION: Rapid alternating movements and fine finger movements are intact. There is no dysmetria on finger-to-nose and heel-knee-shin.    GAIT/STANCE: Posture is normal. Gait is steady with normal steps, base, arm swing, and turning. Heel and toe walking are normal. Tandem gait is normal.  Romberg is absent.   DIAGNOSTIC DATA (LABS, IMAGING, TESTING) - I reviewed patient records, labs, notes, testing and imaging myself where available.   ASSESSMENT AND PLAN  Danielle Bond is a 57 y.o. female   Chronic migraine headaches  Start preventive medication Topamax 50 mg twice a day as migraine prevention  Maxalt 10 mg as needed, plus Zofran 4 mg as needed.  Continue magnesium, also suggested riboflavin 100 mg twice a day as migraine prevention     Marcial Pacas, M.D. Ph.D.  [facility] [address] [place], [place] [address] Ph: [phone] Fax: [phone]  CC: Jani Gravel, MD

## 2017-09-20 ENCOUNTER — Telehealth: Payer: Self-pay | Admitting: Neurology

## 2017-09-20 MED ORDER — BUTALBITAL-APAP-CAFFEINE 50-325-40 MG PO TABS
ORAL_TABLET | ORAL | 3 refills | Status: DC
Start: 2017-09-20 — End: 2018-06-18

## 2017-09-20 NOTE — Addendum Note (Signed)
Addended by: France Ravens I on: 09/20/2017 12:39 PM   Modules accepted: Orders

## 2017-09-20 NOTE — Telephone Encounter (Signed)
Spoke with pt.  Advised she should look for Riboflavin, as this is B2.  Advised she not take Maxalt.  Per YY, [place] faxed to pharmacy.  She c/o excessive drowsiness with Topamax, so I have advised she take 1/2 tab nightly for the next several nights, then, if tolerating ok, ncrease to 1 whole tab nightly  She verbalized understanding of all of above/fim

## 2017-09-20 NOTE — Telephone Encounter (Signed)
Pt has called re: 3 concerns 1. Day one on the topiramate (TOPAMAX) 50 MG tablet pt said was horrible, she felt dizzy, light headed, very confused.  Pt would like to know if MG could be cut in 1/2 . 2 pt states she was told by Dr Krista Blue to get OTC B2 pharmacy told her there is no B2 there is B1, B6 and B12.  Please call to clarify what pt should take. 3rd Pt states re: her   rizatriptan (MAXALT-MLT) 10 MG disintegrating tablet  the pharmacist told her to be very carefull because pt this medication is a "close cousin" to Imitrex and pt is allergic to Imitrex.  Please call

## 2017-10-01 DIAGNOSIS — J301 Allergic rhinitis due to pollen: Secondary | ICD-10-CM | POA: Diagnosis not present

## 2017-10-03 DIAGNOSIS — B079 Viral wart, unspecified: Secondary | ICD-10-CM | POA: Diagnosis not present

## 2017-10-10 DIAGNOSIS — K754 Autoimmune hepatitis: Secondary | ICD-10-CM | POA: Diagnosis not present

## 2017-10-10 DIAGNOSIS — K5904 Chronic idiopathic constipation: Secondary | ICD-10-CM | POA: Diagnosis not present

## 2017-10-10 DIAGNOSIS — K5190 Ulcerative colitis, unspecified, without complications: Secondary | ICD-10-CM | POA: Diagnosis not present

## 2017-10-10 DIAGNOSIS — K219 Gastro-esophageal reflux disease without esophagitis: Secondary | ICD-10-CM | POA: Diagnosis not present

## 2017-10-21 DIAGNOSIS — Z124 Encounter for screening for malignant neoplasm of cervix: Secondary | ICD-10-CM | POA: Diagnosis not present

## 2017-10-21 DIAGNOSIS — Z1231 Encounter for screening mammogram for malignant neoplasm of breast: Secondary | ICD-10-CM | POA: Diagnosis not present

## 2017-10-21 DIAGNOSIS — Z6834 Body mass index (BMI) 34.0-34.9, adult: Secondary | ICD-10-CM | POA: Diagnosis not present

## 2017-10-21 DIAGNOSIS — Z113 Encounter for screening for infections with a predominantly sexual mode of transmission: Secondary | ICD-10-CM | POA: Diagnosis not present

## 2017-10-21 DIAGNOSIS — Z01419 Encounter for gynecological examination (general) (routine) without abnormal findings: Secondary | ICD-10-CM | POA: Diagnosis not present

## 2017-10-21 DIAGNOSIS — E559 Vitamin D deficiency, unspecified: Secondary | ICD-10-CM | POA: Diagnosis not present

## 2017-10-22 LAB — HM PAP SMEAR: HM Pap smear: NEGATIVE

## 2017-10-24 DIAGNOSIS — B079 Viral wart, unspecified: Secondary | ICD-10-CM | POA: Diagnosis not present

## 2017-11-11 DIAGNOSIS — H6121 Impacted cerumen, right ear: Secondary | ICD-10-CM | POA: Diagnosis not present

## 2017-11-11 DIAGNOSIS — M2669 Other specified disorders of temporomandibular joint: Secondary | ICD-10-CM | POA: Diagnosis not present

## 2017-11-11 DIAGNOSIS — H903 Sensorineural hearing loss, bilateral: Secondary | ICD-10-CM | POA: Diagnosis not present

## 2017-11-11 DIAGNOSIS — H9201 Otalgia, right ear: Secondary | ICD-10-CM | POA: Diagnosis not present

## 2017-11-13 DIAGNOSIS — J301 Allergic rhinitis due to pollen: Secondary | ICD-10-CM | POA: Diagnosis not present

## 2017-11-21 DIAGNOSIS — R079 Chest pain, unspecified: Secondary | ICD-10-CM | POA: Diagnosis not present

## 2017-11-21 DIAGNOSIS — J301 Allergic rhinitis due to pollen: Secondary | ICD-10-CM | POA: Diagnosis not present

## 2017-11-21 DIAGNOSIS — E039 Hypothyroidism, unspecified: Secondary | ICD-10-CM | POA: Diagnosis not present

## 2017-11-21 DIAGNOSIS — Z0000 Encounter for general adult medical examination without abnormal findings: Secondary | ICD-10-CM | POA: Diagnosis not present

## 2017-12-05 ENCOUNTER — Other Ambulatory Visit (HOSPITAL_COMMUNITY): Payer: Self-pay | Admitting: Cardiology

## 2017-12-05 DIAGNOSIS — E7800 Pure hypercholesterolemia, unspecified: Secondary | ICD-10-CM | POA: Diagnosis not present

## 2017-12-05 DIAGNOSIS — I209 Angina pectoris, unspecified: Secondary | ICD-10-CM | POA: Diagnosis not present

## 2017-12-05 DIAGNOSIS — Z8249 Family history of ischemic heart disease and other diseases of the circulatory system: Secondary | ICD-10-CM | POA: Diagnosis not present

## 2017-12-05 DIAGNOSIS — R079 Chest pain, unspecified: Secondary | ICD-10-CM

## 2017-12-05 DIAGNOSIS — K754 Autoimmune hepatitis: Secondary | ICD-10-CM | POA: Diagnosis not present

## 2017-12-11 ENCOUNTER — Ambulatory Visit (HOSPITAL_COMMUNITY): Payer: BLUE CROSS/BLUE SHIELD

## 2017-12-17 DIAGNOSIS — M71571 Other bursitis, not elsewhere classified, right ankle and foot: Secondary | ICD-10-CM | POA: Diagnosis not present

## 2017-12-17 DIAGNOSIS — M71572 Other bursitis, not elsewhere classified, left ankle and foot: Secondary | ICD-10-CM | POA: Diagnosis not present

## 2017-12-17 DIAGNOSIS — M722 Plantar fascial fibromatosis: Secondary | ICD-10-CM | POA: Diagnosis not present

## 2017-12-23 ENCOUNTER — Encounter: Payer: Self-pay | Admitting: *Deleted

## 2017-12-23 ENCOUNTER — Telehealth: Payer: Self-pay | Admitting: Neurology

## 2017-12-23 NOTE — Telephone Encounter (Signed)
Information noted in chart.  Topamax added to allergy list.  Patient last seen in 08/2017 and was to follow up in three months.  Next appt pending in 03/2018.  Left message requesting a call back if she would like to be seen sooner.

## 2017-12-23 NOTE — Telephone Encounter (Signed)
Pt would like to inform you that she no longer is taking Topamax because its causing her to get Kidney Stones. Please call with any questions.

## 2017-12-25 ENCOUNTER — Ambulatory Visit: Payer: BLUE CROSS/BLUE SHIELD | Admitting: Neurology

## 2018-01-03 ENCOUNTER — Ambulatory Visit (HOSPITAL_COMMUNITY)
Admission: RE | Admit: 2018-01-03 | Discharge: 2018-01-03 | Disposition: A | Discharge status: Home / Self Care | Payer: BLUE CROSS/BLUE SHIELD | Source: Ambulatory Visit | Attending: Cardiology | Admitting: Cardiology

## 2018-01-03 DIAGNOSIS — R079 Chest pain, unspecified: Secondary | ICD-10-CM | POA: Diagnosis not present

## 2018-01-03 DIAGNOSIS — I2510 Atherosclerotic heart disease of native coronary artery without angina pectoris: Secondary | ICD-10-CM | POA: Diagnosis not present

## 2018-01-03 DIAGNOSIS — Z8249 Family history of ischemic heart disease and other diseases of the circulatory system: Secondary | ICD-10-CM | POA: Insufficient documentation

## 2018-01-03 DIAGNOSIS — R0602 Shortness of breath: Secondary | ICD-10-CM | POA: Diagnosis not present

## 2018-01-03 MED ORDER — DIAZEPAM 5 MG PO TABS
ORAL_TABLET | ORAL | Status: AC
  Administered 2018-01-03: 2.5 mg via ORAL
  Filled 2018-01-03: qty 1

## 2018-01-03 MED ORDER — METOPROLOL TARTRATE 100 MG PO TABS
100.0000 mg | ORAL_TABLET | ORAL | Status: AC
Start: 2018-01-03 — End: 2018-01-03
  Administered 2018-01-03: 100 mg via ORAL
  Filled 2018-01-03: qty 1

## 2018-01-03 MED ORDER — IOPAMIDOL (ISOVUE-370) INJECTION 76%
INTRAVENOUS | Status: AC
  Administered 2018-01-03: 80 mL
  Filled 2018-01-03: qty 100

## 2018-01-03 MED ORDER — NITROGLYCERIN 0.4 MG SL SUBL
SUBLINGUAL_TABLET | SUBLINGUAL | Status: AC
  Administered 2018-01-03: 0.8 mg via SUBLINGUAL
  Filled 2018-01-03: qty 2

## 2018-01-03 MED ORDER — METOPROLOL TARTRATE 50 MG PO TABS
ORAL_TABLET | ORAL | Status: AC
  Administered 2018-01-03: 100 mg via ORAL
  Filled 2018-01-03: qty 2

## 2018-01-03 MED ORDER — NITROGLYCERIN 0.4 MG SL SUBL
0.8000 mg | SUBLINGUAL_TABLET | SUBLINGUAL | Status: DC | PRN
  Administered 2018-01-03: 0.8 mg via SUBLINGUAL
  Filled 2018-01-03 (×2): qty 25

## 2018-01-03 MED ORDER — DIAZEPAM 5 MG PO TABS
2.5000 mg | ORAL_TABLET | ORAL | Status: AC
Start: 2018-01-03 — End: 2018-01-03
  Administered 2018-01-03: 2.5 mg via ORAL
  Filled 2018-01-03 (×2): qty 1

## 2018-01-03 MED ORDER — METOPROLOL TARTRATE 100 MG PO TABS
100.0000 mg | ORAL_TABLET | ORAL | Status: AC
  Administered 2018-01-03: 100 mg via ORAL
  Filled 2018-01-03: qty 1

## 2018-01-15 DIAGNOSIS — E7800 Pure hypercholesterolemia, unspecified: Secondary | ICD-10-CM | POA: Diagnosis not present

## 2018-01-15 DIAGNOSIS — K754 Autoimmune hepatitis: Secondary | ICD-10-CM | POA: Diagnosis not present

## 2018-01-15 DIAGNOSIS — Z8249 Family history of ischemic heart disease and other diseases of the circulatory system: Secondary | ICD-10-CM | POA: Diagnosis not present

## 2018-01-16 DIAGNOSIS — E039 Hypothyroidism, unspecified: Secondary | ICD-10-CM | POA: Diagnosis not present

## 2018-02-03 DIAGNOSIS — M2550 Pain in unspecified joint: Secondary | ICD-10-CM | POA: Diagnosis not present

## 2018-02-03 DIAGNOSIS — M7531 Calcific tendinitis of right shoulder: Secondary | ICD-10-CM | POA: Diagnosis not present

## 2018-02-03 DIAGNOSIS — K5190 Ulcerative colitis, unspecified, without complications: Secondary | ICD-10-CM | POA: Diagnosis not present

## 2018-02-03 DIAGNOSIS — M79643 Pain in unspecified hand: Secondary | ICD-10-CM | POA: Diagnosis not present

## 2018-02-09 DIAGNOSIS — R05 Cough: Secondary | ICD-10-CM | POA: Diagnosis not present

## 2018-02-09 DIAGNOSIS — J069 Acute upper respiratory infection, unspecified: Secondary | ICD-10-CM | POA: Diagnosis not present

## 2018-02-11 DIAGNOSIS — R590 Localized enlarged lymph nodes: Secondary | ICD-10-CM | POA: Diagnosis not present

## 2018-02-11 DIAGNOSIS — R05 Cough: Secondary | ICD-10-CM | POA: Diagnosis not present

## 2018-02-12 ENCOUNTER — Other Ambulatory Visit: Payer: Self-pay | Admitting: Internal Medicine

## 2018-02-12 DIAGNOSIS — R059 Cough, unspecified: Secondary | ICD-10-CM

## 2018-02-12 DIAGNOSIS — R05 Cough: Secondary | ICD-10-CM

## 2018-02-12 DIAGNOSIS — R590 Localized enlarged lymph nodes: Secondary | ICD-10-CM

## 2018-02-13 ENCOUNTER — Ambulatory Visit
Admission: RE | Admit: 2018-02-13 | Discharge: 2018-02-13 | Disposition: A | Discharge status: Home / Self Care | Payer: BLUE CROSS/BLUE SHIELD | Source: Ambulatory Visit | Attending: Internal Medicine | Admitting: Internal Medicine

## 2018-02-13 DIAGNOSIS — R05 Cough: Secondary | ICD-10-CM

## 2018-02-13 DIAGNOSIS — R590 Localized enlarged lymph nodes: Secondary | ICD-10-CM

## 2018-02-13 DIAGNOSIS — R599 Enlarged lymph nodes, unspecified: Secondary | ICD-10-CM | POA: Diagnosis not present

## 2018-02-13 DIAGNOSIS — R059 Cough, unspecified: Secondary | ICD-10-CM

## 2018-02-13 MED ORDER — IOPAMIDOL (ISOVUE-300) INJECTION 61%
110.0000 mL | Freq: Once | INTRAVENOUS | Status: AC | PRN
  Administered 2018-02-13: 110 mL via INTRAVENOUS

## 2018-03-17 DIAGNOSIS — B079 Viral wart, unspecified: Secondary | ICD-10-CM | POA: Diagnosis not present

## 2018-03-18 ENCOUNTER — Emergency Department (HOSPITAL_COMMUNITY): Payer: BLUE CROSS/BLUE SHIELD

## 2018-03-18 ENCOUNTER — Emergency Department (HOSPITAL_COMMUNITY)
Admission: EM | Admit: 2018-03-18 | Discharge: 2018-03-18 | Disposition: A | Discharge status: Home / Self Care | Payer: BLUE CROSS/BLUE SHIELD | Attending: Emergency Medicine | Admitting: Emergency Medicine

## 2018-03-18 ENCOUNTER — Encounter (HOSPITAL_COMMUNITY): Payer: Self-pay

## 2018-03-18 DIAGNOSIS — R1011 Right upper quadrant pain: Secondary | ICD-10-CM | POA: Diagnosis not present

## 2018-03-18 DIAGNOSIS — N189 Chronic kidney disease, unspecified: Secondary | ICD-10-CM | POA: Insufficient documentation

## 2018-03-18 DIAGNOSIS — R140 Abdominal distension (gaseous): Secondary | ICD-10-CM | POA: Diagnosis not present

## 2018-03-18 DIAGNOSIS — K5190 Ulcerative colitis, unspecified, without complications: Secondary | ICD-10-CM | POA: Diagnosis not present

## 2018-03-18 DIAGNOSIS — J45909 Unspecified asthma, uncomplicated: Secondary | ICD-10-CM | POA: Insufficient documentation

## 2018-03-18 DIAGNOSIS — R1033 Periumbilical pain: Secondary | ICD-10-CM | POA: Diagnosis not present

## 2018-03-18 DIAGNOSIS — R1084 Generalized abdominal pain: Secondary | ICD-10-CM

## 2018-03-18 DIAGNOSIS — Z79899 Other long term (current) drug therapy: Secondary | ICD-10-CM | POA: Insufficient documentation

## 2018-03-18 DIAGNOSIS — K754 Autoimmune hepatitis: Secondary | ICD-10-CM | POA: Diagnosis not present

## 2018-03-18 DIAGNOSIS — R109 Unspecified abdominal pain: Secondary | ICD-10-CM | POA: Diagnosis not present

## 2018-03-18 LAB — COMPREHENSIVE METABOLIC PANEL
ALBUMIN: 4.6 g/dL (ref 3.5–5.0)
ALK PHOS: 69 U/L (ref 38–126)
ALT: 36 U/L (ref 0–44)
AST: 42 U/L — AB (ref 15–41)
Anion gap: 10 (ref 5–15)
BILIRUBIN TOTAL: 0.7 mg/dL (ref 0.3–1.2)
BUN: 12 mg/dL (ref 6–20)
CALCIUM: 9.6 mg/dL (ref 8.9–10.3)
CO2: 28 mmol/L (ref 22–32)
Chloride: 106 mmol/L (ref 98–111)
Creatinine, Ser: 0.68 mg/dL (ref 0.44–1.00)
GFR calc Af Amer: >60 mL/min (ref >60)
GFR calc non Af Amer: >60 mL/min (ref >60)
GLUCOSE: 104 mg/dL — AB (ref 70–99)
POTASSIUM: 4.1 mmol/L (ref 3.5–5.1)
Sodium: 144 mmol/L (ref 135–145)
TOTAL PROTEIN: 8.2 g/dL — AB (ref 6.5–8.1)

## 2018-03-18 LAB — CBC
HEMATOCRIT: 40.6 % (ref 36.0–46.0)
Hemoglobin: 13.3 g/dL (ref 12.0–15.0)
MCH: 29.8 pg (ref 26.0–34.0)
MCHC: 32.8 g/dL (ref 30.0–36.0)
MCV: 90.8 fL (ref 78.0–100.0)
Platelets: 293 10*3/uL (ref 150–400)
RBC: 4.47 MIL/uL (ref 3.87–5.11)
RDW: 13.3 % (ref 11.5–15.5)
WBC: 5.1 10*3/uL (ref 4.0–10.5)

## 2018-03-18 LAB — URINALYSIS, ROUTINE W REFLEX MICROSCOPIC
BILIRUBIN URINE: NEGATIVE
Glucose, UA: NEGATIVE mg/dL
HGB URINE DIPSTICK: NEGATIVE
KETONES UR: 5 mg/dL — AB
Leukocytes, UA: NEGATIVE
NITRITE: NEGATIVE
PH: 5 (ref 5.0–8.0)
Protein, ur: NEGATIVE mg/dL
Specific Gravity, Urine: 1.018 (ref 1.005–1.030)

## 2018-03-18 LAB — I-STAT BETA HCG BLOOD, ED (MC, WL, AP ONLY): I-stat hCG, quantitative: <5 m[IU]/mL (ref <5)

## 2018-03-18 LAB — LIPASE, BLOOD: LIPASE: 42 U/L (ref 11–51)

## 2018-03-18 MED ORDER — ONDANSETRON HCL 4 MG/2ML IJ SOLN
4.0000 mg | Freq: Once | INTRAMUSCULAR | Status: AC
  Administered 2018-03-18: 4 mg via INTRAVENOUS
  Filled 2018-03-18: qty 2

## 2018-03-18 MED ORDER — SODIUM CHLORIDE 0.9 % IV BOLUS
1000.0000 mL | Freq: Once | INTRAVENOUS | Status: AC
  Administered 2018-03-18: 1000 mL via INTRAVENOUS

## 2018-03-18 MED ORDER — IOPAMIDOL (ISOVUE-300) INJECTION 61%
100.0000 mL | Freq: Once | INTRAVENOUS | Status: AC | PRN
  Administered 2018-03-18: 100 mL via INTRAVENOUS

## 2018-03-18 MED ORDER — IOPAMIDOL (ISOVUE-300) INJECTION 61%
INTRAVENOUS | Status: AC
  Filled 2018-03-18: qty 100

## 2018-03-18 NOTE — ED Provider Notes (Signed)
[facility] DEPT Provider Note   CSN: [id number] Arrival date & time: 03/18/18  1656     History   Chief Complaint Chief Complaint  Patient presents with  . Abdominal Pain  . Back Pain    HPI Danielle Bond is a 57 y.o. female.  She is presenting here with 2 days of right upper quadrant abdominal pain radiating to her back and abdominal distention.  She has been nauseous.  One episode of vomiting yellow liquid.  Loose stools.  No fevers no chills.  No urinary symptoms.  She has a history of ulcerative colitis and went to see her gastroenterologist Dr. Collene Mares who suggested she come here for further testing.  She states she is had a history of pancreatitis idiopathic, status post [place].  Recent medication changes.  The history is provided by the patient.  Abdominal Pain   This is a new problem. The current episode started yesterday. The problem occurs constantly. The problem has not changed since onset.The pain is associated with an unknown factor. The pain is located in the RUQ. The quality of the pain is cramping. The pain is moderate. Associated symptoms include diarrhea, nausea, vomiting and headaches. Pertinent negatives include fever, hematochezia, melena, constipation, dysuria, frequency and hematuria. Nothing aggravates the symptoms. Nothing relieves the symptoms. Past workup includes GI consult and CT scan.  Back Pain   Associated symptoms include headaches and abdominal pain. Pertinent negatives include no chest pain, no fever and no dysuria.    Past Medical History:  Diagnosis Date  . Asthma   . Chronic kidney disease   . Kidney stone   . Kidney stone   . Migraine   . Mitral valve prolapse   . Orthostatic hypotension   . Ulcerative colitis ([place]) 03/31/2012    Patient Active Problem List   Diagnosis Date Noted  . Chronic migraine 09/17/2017  . Impingement syndrome of right shoulder 11/29/2016  . Pure hypercholesterolemia 12/22/2013  .  Universal ulcerative (chronic) colitis(556.6) 09/21/2012  . Hemorrhage of rectum and anus 09/21/2012  . Other abnormal blood chemistry 09/21/2012  . Autoimmune hepatitis ([place]) 08/16/2012  . Hypokalemia 08/16/2012  . Transaminitis 08/15/2012  . History of anemia 03/31/2012  . Asthma 03/31/2012  . Osteopenia 03/31/2012  . Ulcerative colitis ([place]) 03/31/2012  . History of renal calculi 03/31/2012  . History of migraine 03/31/2012  . Mitral valve prolapse   . Migraine   . Orthostatic hypotension     Past Surgical History:  Procedure Laterality Date  . CESAREAN SECTION    . CHOLECYSTECTOMY    . COLONOSCOPY    . THYROIDECTOMY       OB History    Gravida  2   Para      Term      Preterm      AB  2   Living  2     SAB  2   TAB      Ectopic      Multiple      Live Births               Home Medications    Prior to Admission medications   Medication Sig Start Date End Date Taking? Authorizing Provider  albuterol (PROVENTIL HFA;VENTOLIN HFA) 108 (90 Base) MCG/ACT inhaler Inhale 2 puffs into the lungs every 6 (six) hours as needed for wheezing or shortness of breath.    [provider]  butalbital-acetaminophen-caffeine (FIORICET, ESGIC) 50-325-40 MG tablet One tablet once daily  as needed for migraine headache.  Prescription must last 30 days. 09/20/17   Marcial Pacas, MD  cholecalciferol (VITAMIN D) 1000 units tablet Take 1,000 Units by mouth daily.    [provider]  clindamycin (CLEOCIN) 150 MG capsule Take by mouth as needed. Takes prior to dental procedures. 07/11/17   [provider]  CRESTOR 10 MG tablet Take 1 tablet by mouth daily. 08/12/14   [provider]  folic acid (FOLVITE) 253 MCG tablet Take 800 mcg by mouth daily.    [provider]  levothyroxine (SYNTHROID, LEVOTHROID) 100 MCG tablet Take 100 mcg by mouth daily before breakfast.    [provider]  mesalamine (LIALDA) 1.2 G EC tablet Take 3.6 g  by mouth daily with breakfast.     [provider]  ondansetron (ZOFRAN ODT) 4 MG disintegrating tablet Take 1 tablet (4 mg total) by mouth every 8 (eight) hours as needed. 09/17/17   Marcial Pacas, MD  vitamin E 100 UNIT capsule Take 100 Units by mouth daily.    [provider]    Family History Family History  Problem Relation Age of Onset  . Cervical cancer Paternal Aunt   . Heart disease Paternal Grandmother   . Heart disease Mother   . Dementia Mother   . Thyroid disease Mother   . Osteoarthritis Mother   . Heart attack Father     Social History Social History   Tobacco Use  . Smoking status: Never Smoker  . Smokeless tobacco: Never Used  Substance Use Topics  . Alcohol use: No  . Drug use: No     Allergies   Almond oil; Other; Aspirin; Hydromorphone hcl; Penicillins; Acetaminophen; Corticosteroids; Imitrex [sumatriptan]; Stadol [butorphanol]; Topamax [topiramate]; Vancomycin; Azathioprine; Pepcid [famotidine]; and Prednisone   Review of Systems Review of Systems  Constitutional: Negative for fever.  HENT: Negative for sore throat.   Eyes: Negative for visual disturbance.  Respiratory: Negative for shortness of breath.   Cardiovascular: Negative for chest pain.  Gastrointestinal: Positive for abdominal pain, diarrhea, nausea and vomiting. Negative for constipation, hematochezia and melena.  Genitourinary: Negative for dysuria, frequency and hematuria.  Musculoskeletal: Positive for back pain.  Skin: Negative for rash.  Neurological: Positive for headaches.     Physical Exam Updated Vital Signs BP (!) 147/80   Pulse 81   Temp 98.1 F (36.7 C) (Oral)   Resp 18   Ht 5' 8"  (1.727 m)   Wt 65.7 kg   LMP 03/03/2013   SpO2 100%   BMI 22.02 kg/m   Physical Exam  Constitutional: She is oriented to person, place, and time. She appears well-developed and well-nourished. No distress.  HENT:  Head: Normocephalic and atraumatic.  Eyes:  Conjunctivae are normal.  Neck: Neck supple.  Cardiovascular: Normal rate, regular rhythm, normal heart sounds and intact distal pulses.  No murmur heard. Pulmonary/Chest: Effort normal and breath sounds normal. No respiratory distress.  Abdominal: Soft. She exhibits no mass. There is tenderness in the right upper quadrant. There is no rigidity, no rebound and no guarding.  Musculoskeletal: She exhibits no edema or deformity.  Neurological: She is alert and oriented to person, place, and time.  Skin: Skin is warm and dry. Capillary refill takes less than 2 seconds.  Psychiatric: She has a normal mood and affect.  Nursing note and vitals reviewed.    ED Treatments / Results  Labs (all labs ordered are listed, but only abnormal results are displayed) Labs Reviewed  COMPREHENSIVE METABOLIC  PANEL - Abnormal; Notable for the following components:      Result Value   Glucose, Bld 104 (*)    Total Protein 8.2 (*)    AST 42 (*)    All other components within normal limits  URINALYSIS, ROUTINE W REFLEX MICROSCOPIC - Abnormal; Notable for the following components:   Ketones, ur 5 (*)    All other components within normal limits  LIPASE, BLOOD  CBC  I-STAT BETA HCG BLOOD, ED ([facility], [facility], [facility] ONLY)    EKG None  Radiology Ct Abdomen Pelvis W Contrast  Result Date: 03/18/2018 CLINICAL DATA:  Generalized abdominal pain. History of pancreatitis. EXAM: CT ABDOMEN AND PELVIS WITH CONTRAST TECHNIQUE: Multidetector CT imaging of the abdomen and pelvis was performed using the standard protocol following bolus administration of intravenous contrast. CONTRAST:  14m ISOVUE-300 IOPAMIDOL (ISOVUE-300) INJECTION 61% COMPARISON:  08/15/2012 FINDINGS: Lower chest: Lung bases are clear. No effusions. Heart is normal size. Hepatobiliary: No focal liver abnormality is seen. Status post cholecystectomy. No biliary dilatation. Pancreas: No focal abnormality or ductal dilatation. No peripancreatic inflammation.  Spleen: No focal abnormality.  Normal size. Adrenals/Urinary Tract: No adrenal abnormality. No focal renal abnormality. No stones or hydronephrosis. Urinary bladder is unremarkable. Stomach/Bowel: Normal appendix. Stomach, large and small bowel grossly unremarkable. Vascular/Lymphatic: No evidence of aneurysm or adenopathy. Reproductive: Uterus and adnexa unremarkable.  No mass. Other: No free fluid or free air. Musculoskeletal: No acute bony abnormality. IMPRESSION: No acute findings in the abdomen or pelvis. Electronically Signed   By: KRolm BaptiseM.D.   On: 03/18/2018 19:56    Procedures Procedures (including critical care time)  Medications Ordered in ED Medications  ondansetron (ZOFRAN) injection 4 mg (has no administration in time range)  sodium chloride 0.9 % bolus 1,000 mL (has no administration in time range)     Initial Impression / Assessment and Plan / ED Course  I have reviewed the triage vital signs and the nursing notes.  Pertinent labs & imaging results that were available during my care of the patient were reviewed by me and considered in my medical decision making (see chart for details).  Clinical Course as of Mar 19 1525  Tue Aug 27, [address] [id number] old female with prior history of ulcerative colitis and pancreatitis here with right upper quadrant pain radiating through to her back associated with nausea and vomiting.  She says his pain is different than her ulcerative colitis and may be feels more similar to a kidney stone or pancreatitis that she had in the past.  She does not drink any alcohol and has no new medications.  Her initial screening labs are fairly unremarkable including a normal lipase.  I put her in for a CAT scan of her abdomen and pelvis.  Any pain medicine but would take some fluids and nausea medicine.   [MB]  2015 Patient CT is unremarkable along with benign labs.  I recommended to her that she call her GI doctor tomorrow as Dr. MCollene Maresjust saw her  today and get recommendations on medications that might help with her symptoms.  She is comfortable with this.   [MB]    Clinical Course User Index [MB] BHayden Rasmussen MD     Final Clinical Impressions(s) / ED Diagnoses   Final diagnoses:  Generalized abdominal pain    ED Discharge Orders    None       BHayden Rasmussen MD 03/19/18 1527

## 2018-03-18 NOTE — Discharge Instructions (Addendum)
Your evaluated in the emergency department for worsening abdominal pain radiating through to your back.  You had unremarkable labs and also a CAT scan that did not show an obvious cause of your symptoms.  Please call your GI doctor tomorrow for further recommendations.

## 2018-03-18 NOTE — ED Triage Notes (Signed)
Pt is c/o middle back pain 9/10 pressure heavy and distended abdomen that started last night. Pt reports associated nausea and vomiting and reports yellow emesis. Pt states she was seen by Dr. Collene Mares her GI and was referred to come to ED. Pt reports hx of pancreatitis. Denies and pain or burning with urination

## 2018-03-18 NOTE — ED Notes (Signed)
Pt is in CT and will up date vitals signs when the pt return

## 2018-03-26 DIAGNOSIS — M84375A Stress fracture, left foot, initial encounter for fracture: Secondary | ICD-10-CM | POA: Diagnosis not present

## 2018-03-26 DIAGNOSIS — E559 Vitamin D deficiency, unspecified: Secondary | ICD-10-CM | POA: Diagnosis not present

## 2018-03-26 DIAGNOSIS — M8580 Other specified disorders of bone density and structure, unspecified site: Secondary | ICD-10-CM | POA: Diagnosis not present

## 2018-03-31 DIAGNOSIS — Z8719 Personal history of other diseases of the digestive system: Secondary | ICD-10-CM | POA: Diagnosis not present

## 2018-03-31 DIAGNOSIS — R197 Diarrhea, unspecified: Secondary | ICD-10-CM | POA: Diagnosis not present

## 2018-03-31 DIAGNOSIS — H6980 Other specified disorders of Eustachian tube, unspecified ear: Secondary | ICD-10-CM | POA: Diagnosis not present

## 2018-03-31 DIAGNOSIS — J45909 Unspecified asthma, uncomplicated: Secondary | ICD-10-CM | POA: Diagnosis not present

## 2018-04-02 DIAGNOSIS — M84375D Stress fracture, left foot, subsequent encounter for fracture with routine healing: Secondary | ICD-10-CM | POA: Diagnosis not present

## 2018-04-03 ENCOUNTER — Telehealth: Payer: Self-pay | Admitting: *Deleted

## 2018-04-03 ENCOUNTER — Ambulatory Visit: Payer: BLUE CROSS/BLUE SHIELD | Admitting: Neurology

## 2018-04-03 ENCOUNTER — Encounter: Payer: Self-pay | Admitting: Neurology

## 2018-04-03 NOTE — Telephone Encounter (Signed)
No showed follow up appointment. 

## 2018-04-08 DIAGNOSIS — R194 Change in bowel habit: Secondary | ICD-10-CM | POA: Diagnosis not present

## 2018-04-08 DIAGNOSIS — K219 Gastro-esophageal reflux disease without esophagitis: Secondary | ICD-10-CM | POA: Diagnosis not present

## 2018-04-08 DIAGNOSIS — K5120 Ulcerative (chronic) proctitis without complications: Secondary | ICD-10-CM | POA: Diagnosis not present

## 2018-04-08 DIAGNOSIS — Z800 Family history of malignant neoplasm of digestive organs: Secondary | ICD-10-CM | POA: Diagnosis not present

## 2018-04-13 ENCOUNTER — Telehealth: Payer: Self-pay | Admitting: Internal Medicine

## 2018-04-13 NOTE — Telephone Encounter (Signed)
Patient of Dr. Youlanda Mighty Has UC  In past week diarrhea and now rectal bleeding, rectal sorness Seen in ED and neg CT, NL CBC  Stool Stidies were neg for infection but she only received a message about this and is having increased rectal pressure, tenesmus and bleeding  Wants to know what next step in Tx and Dx is  I advised I would pass along to Dr. Collene Mares and would think she would hear from her tomorrow

## 2018-04-15 DIAGNOSIS — K600 Acute anal fissure: Secondary | ICD-10-CM | POA: Diagnosis not present

## 2018-04-15 DIAGNOSIS — K625 Hemorrhage of anus and rectum: Secondary | ICD-10-CM | POA: Diagnosis not present

## 2018-04-15 DIAGNOSIS — R194 Change in bowel habit: Secondary | ICD-10-CM | POA: Diagnosis not present

## 2018-04-15 DIAGNOSIS — K51011 Ulcerative (chronic) pancolitis with rectal bleeding: Secondary | ICD-10-CM | POA: Diagnosis not present

## 2018-04-28 DIAGNOSIS — M84375D Stress fracture, left foot, subsequent encounter for fracture with routine healing: Secondary | ICD-10-CM | POA: Diagnosis not present

## 2018-04-29 DIAGNOSIS — H9201 Otalgia, right ear: Secondary | ICD-10-CM | POA: Diagnosis not present

## 2018-04-29 DIAGNOSIS — H6121 Impacted cerumen, right ear: Secondary | ICD-10-CM | POA: Diagnosis not present

## 2018-04-29 DIAGNOSIS — H60331 Swimmer's ear, right ear: Secondary | ICD-10-CM | POA: Diagnosis not present

## 2018-05-05 DIAGNOSIS — K51011 Ulcerative (chronic) pancolitis with rectal bleeding: Secondary | ICD-10-CM | POA: Diagnosis not present

## 2018-05-12 DIAGNOSIS — E039 Hypothyroidism, unspecified: Secondary | ICD-10-CM | POA: Diagnosis not present

## 2018-05-17 DIAGNOSIS — R0602 Shortness of breath: Secondary | ICD-10-CM | POA: Diagnosis not present

## 2018-05-17 DIAGNOSIS — J069 Acute upper respiratory infection, unspecified: Secondary | ICD-10-CM | POA: Diagnosis not present

## 2018-05-17 DIAGNOSIS — R739 Hyperglycemia, unspecified: Secondary | ICD-10-CM | POA: Diagnosis not present

## 2018-05-17 DIAGNOSIS — R002 Palpitations: Secondary | ICD-10-CM | POA: Diagnosis not present

## 2018-05-17 DIAGNOSIS — R5383 Other fatigue: Secondary | ICD-10-CM | POA: Diagnosis not present

## 2018-05-17 DIAGNOSIS — Z79899 Other long term (current) drug therapy: Secondary | ICD-10-CM | POA: Diagnosis not present

## 2018-05-21 DIAGNOSIS — R509 Fever, unspecified: Secondary | ICD-10-CM | POA: Diagnosis not present

## 2018-05-21 DIAGNOSIS — J069 Acute upper respiratory infection, unspecified: Secondary | ICD-10-CM | POA: Diagnosis not present

## 2018-05-21 DIAGNOSIS — J45909 Unspecified asthma, uncomplicated: Secondary | ICD-10-CM | POA: Diagnosis not present

## 2018-05-23 ENCOUNTER — Emergency Department (HOSPITAL_COMMUNITY)
Admission: EM | Admit: 2018-05-23 | Discharge: 2018-05-23 | Disposition: A | Discharge status: Home / Self Care | Payer: BLUE CROSS/BLUE SHIELD | Attending: Emergency Medicine | Admitting: Emergency Medicine

## 2018-05-23 ENCOUNTER — Emergency Department (HOSPITAL_COMMUNITY): Payer: BLUE CROSS/BLUE SHIELD

## 2018-05-23 ENCOUNTER — Other Ambulatory Visit: Payer: Self-pay

## 2018-05-23 ENCOUNTER — Encounter (HOSPITAL_COMMUNITY): Payer: Self-pay | Admitting: Emergency Medicine

## 2018-05-23 DIAGNOSIS — R05 Cough: Secondary | ICD-10-CM | POA: Diagnosis not present

## 2018-05-23 DIAGNOSIS — R0789 Other chest pain: Secondary | ICD-10-CM | POA: Diagnosis not present

## 2018-05-23 DIAGNOSIS — R509 Fever, unspecified: Secondary | ICD-10-CM | POA: Insufficient documentation

## 2018-05-23 DIAGNOSIS — R000 Tachycardia, unspecified: Secondary | ICD-10-CM | POA: Diagnosis not present

## 2018-05-23 DIAGNOSIS — R1012 Left upper quadrant pain: Secondary | ICD-10-CM | POA: Insufficient documentation

## 2018-05-23 DIAGNOSIS — R7989 Other specified abnormal findings of blood chemistry: Secondary | ICD-10-CM | POA: Diagnosis not present

## 2018-05-23 DIAGNOSIS — Z79899 Other long term (current) drug therapy: Secondary | ICD-10-CM | POA: Diagnosis not present

## 2018-05-23 DIAGNOSIS — J40 Bronchitis, not specified as acute or chronic: Secondary | ICD-10-CM | POA: Diagnosis not present

## 2018-05-23 LAB — CBC WITH DIFFERENTIAL/PLATELET
ABS IMMATURE GRANULOCYTES: 0.03 10*3/uL (ref 0.00–0.07)
BASOS PCT: 1 %
Basophils Absolute: 0.1 10*3/uL (ref 0.0–0.1)
EOS PCT: 8 %
Eosinophils Absolute: 0.6 10*3/uL — ABNORMAL HIGH (ref 0.0–0.5)
HCT: 40.9 % (ref 36.0–46.0)
Hemoglobin: 12.9 g/dL (ref 12.0–15.0)
Immature Granulocytes: 0 %
Lymphocytes Relative: 17 %
Lymphs Abs: 1.4 10*3/uL (ref 0.7–4.0)
MCH: 28.8 pg (ref 26.0–34.0)
MCHC: 31.5 g/dL (ref 30.0–36.0)
MCV: 91.3 fL (ref 80.0–100.0)
MONO ABS: 0.8 10*3/uL (ref 0.1–1.0)
Monocytes Relative: 9 %
NEUTROS ABS: 5.6 10*3/uL (ref 1.7–7.7)
Neutrophils Relative %: 65 %
Platelets: 176 10*3/uL (ref 150–400)
RBC: 4.48 MIL/uL (ref 3.87–5.11)
RDW: 12.4 % (ref 11.5–15.5)
WBC: 8.5 10*3/uL (ref 4.0–10.5)
nRBC: 0 % (ref 0.0–0.2)

## 2018-05-23 LAB — I-STAT CG4 LACTIC ACID, ED
LACTIC ACID, VENOUS: 1.29 mmol/L (ref 0.5–1.9)
Lactic Acid, Venous: 2.11 mmol/L (ref 0.5–1.9)

## 2018-05-23 LAB — URINALYSIS, ROUTINE W REFLEX MICROSCOPIC
BILIRUBIN URINE: NEGATIVE
GLUCOSE, UA: NEGATIVE mg/dL
HGB URINE DIPSTICK: NEGATIVE
Ketones, ur: NEGATIVE mg/dL
LEUKOCYTES UA: NEGATIVE
NITRITE: NEGATIVE
PROTEIN: 30 mg/dL — AB
Specific Gravity, Urine: 1.023 (ref 1.005–1.030)
pH: 5 (ref 5.0–8.0)

## 2018-05-23 LAB — COMPREHENSIVE METABOLIC PANEL
ALT: 28 U/L (ref 0–44)
ANION GAP: 10 (ref 5–15)
AST: 34 U/L (ref 15–41)
Albumin: 4.1 g/dL (ref 3.5–5.0)
Alkaline Phosphatase: 69 U/L (ref 38–126)
BUN: 7 mg/dL (ref 6–20)
CHLORIDE: 103 mmol/L (ref 98–111)
CO2: 21 mmol/L — AB (ref 22–32)
CREATININE: 0.86 mg/dL (ref 0.44–1.00)
Calcium: 8.6 mg/dL — ABNORMAL LOW (ref 8.9–10.3)
Glucose, Bld: 157 mg/dL — ABNORMAL HIGH (ref 70–99)
POTASSIUM: 4.1 mmol/L (ref 3.5–5.1)
SODIUM: 134 mmol/L — AB (ref 135–145)
Total Bilirubin: 0.8 mg/dL (ref 0.3–1.2)
Total Protein: 8 g/dL (ref 6.5–8.1)

## 2018-05-23 LAB — INFLUENZA PANEL BY PCR (TYPE A & B)
INFLBPCR: NEGATIVE
Influenza A By PCR: NEGATIVE

## 2018-05-23 LAB — D-DIMER, QUANTITATIVE: D-Dimer, Quant: 5.94 ug/mL-FEU — ABNORMAL HIGH (ref 0.00–0.50)

## 2018-05-23 LAB — LIPASE, BLOOD: LIPASE: 30 U/L (ref 11–51)

## 2018-05-23 MED ORDER — IBUPROFEN 800 MG PO TABS
800.0000 mg | ORAL_TABLET | Freq: Once | ORAL | Status: AC
  Administered 2018-05-23: 800 mg via ORAL
  Filled 2018-05-23: qty 1

## 2018-05-23 MED ORDER — SODIUM CHLORIDE 0.9 % IV BOLUS (SEPSIS)
1000.0000 mL | Freq: Once | INTRAVENOUS | Status: AC
  Administered 2018-05-23: 1000 mL via INTRAVENOUS

## 2018-05-23 MED ORDER — IPRATROPIUM-ALBUTEROL 0.5-2.5 (3) MG/3ML IN SOLN
3.0000 mL | Freq: Once | RESPIRATORY_TRACT | Status: AC
  Administered 2018-05-23: 3 mL via RESPIRATORY_TRACT
  Filled 2018-05-23: qty 3

## 2018-05-23 MED ORDER — ALBUTEROL SULFATE 108 (90 BASE) MCG/ACT IN AEPB: 1.0000 | INHALATION_SPRAY | RESPIRATORY_TRACT | 0 refills | Status: DC | PRN

## 2018-05-23 MED ORDER — SODIUM CHLORIDE 0.9 % IJ SOLN
INTRAMUSCULAR | Status: AC
  Filled 2018-05-23: qty 50

## 2018-05-23 MED ORDER — ONDANSETRON HCL 4 MG/2ML IJ SOLN
4.0000 mg | Freq: Once | INTRAMUSCULAR | Status: AC
  Administered 2018-05-23: 4 mg via INTRAVENOUS
  Filled 2018-05-23: qty 2

## 2018-05-23 MED ORDER — IOPAMIDOL (ISOVUE-370) INJECTION 76%
INTRAVENOUS | Status: AC
  Filled 2018-05-23: qty 100

## 2018-05-23 MED ORDER — IOPAMIDOL (ISOVUE-370) INJECTION 76%
100.0000 mL | Freq: Once | INTRAVENOUS | Status: AC | PRN
  Administered 2018-05-23: 100 mL via INTRAVENOUS

## 2018-05-23 MED ORDER — GUAIFENESIN-CODEINE 100-10 MG/5ML PO SOLN: 10.0000 mL | ORAL | 0 refills | Status: DC | PRN

## 2018-05-23 NOTE — ED Provider Notes (Signed)
Patient signed out to me by Dr. Leonides Schanz.  Chest CT without acute findings.  Patient's work-up here in the ED reviewed including negative flu test.  No leukocytosis.  Patient states she feels better after breathing treatment.  Suspect that she is got some component of viral bronchitis.  Will place patient a codeine cough syrup.  Patient does not want to take any corticosteroids due to reflexive hyperglycemia.  Will give albuterol inhaler and return precautions given   [name], MD 05/23/18 1102

## 2018-05-23 NOTE — ED Provider Notes (Signed)
TIME SEEN: 5:04 AM  CHIEF COMPLAINT: Cough, fever  HPI: Patient is a 57 year old female with history of asthma, kidney stones, ulcerative colitis who presents to the emergency department with complaints of fever, cough for the past week.  Was seen at urgent care on October 26 and was placed on an antibiotic and given Decadron.  Went back 3 days later for continued symptoms and was changed to a different antibiotic.  States that time she was told she had a "false positive" flu test.  She is currently on Levaquin and Tessalon Perles and has no improvement.  Is having some left-sided upper abdominal pain and chest pain from coughing so much.  Also reports for the past month she has had diarrhea with some rectal bleeding that has been worked up as an outpatient.  Was supposed to have a colonoscopy this week but had to be canceled secondary to her fever and cough.  No vomiting.  She is having some nausea.  ROS: See HPI Constitutional:  fever  Eyes: no drainage  ENT: no runny nose   Cardiovascular:  no chest pain  Resp: no SOB  GI: no vomiting GU: no dysuria Integumentary: no rash  Allergy: no hives  Musculoskeletal: no leg swelling  Neurological: no slurred speech ROS otherwise negative  PAST MEDICAL HISTORY/PAST SURGICAL HISTORY:  Past Medical History:  Diagnosis Date  . Asthma   . Chronic kidney disease   . Kidney stone   . Kidney stone   . Migraine   . Mitral valve prolapse   . Orthostatic hypotension   . Ulcerative colitis ([place]) 03/31/2012    MEDICATIONS:  Prior to Admission medications   Medication Sig Start Date End Date Taking? Authorizing Provider  albuterol (PROVENTIL HFA;VENTOLIN HFA) 108 (90 Base) MCG/ACT inhaler Inhale 2 puffs into the lungs every 6 (six) hours as needed for wheezing or shortness of breath.    [provider]  butalbital-acetaminophen-caffeine (FIORICET, ESGIC) 50-325-40 MG tablet One tablet once daily as needed for migraine headache.  Prescription  must last 30 days. 09/20/17   Marcial Pacas, MD  cholecalciferol (VITAMIN D) 1000 units tablet Take 1,000 Units by mouth daily.    [provider]  clindamycin (CLEOCIN) 150 MG capsule Take by mouth as needed. Takes prior to dental procedures. 07/11/17   [provider]  CRESTOR 10 MG tablet Take 1 tablet by mouth daily. 08/12/14   [provider]  folic acid (FOLVITE) 179 MCG tablet Take 800 mcg by mouth daily.    [provider]  levothyroxine (SYNTHROID, LEVOTHROID) 100 MCG tablet Take 100 mcg by mouth daily before breakfast.    [provider]  mesalamine (LIALDA) 1.2 G EC tablet Take 3.6 g by mouth daily with breakfast.     [provider]  ondansetron (ZOFRAN ODT) 4 MG disintegrating tablet Take 1 tablet (4 mg total) by mouth every 8 (eight) hours as needed. 09/17/17   Marcial Pacas, MD  vitamin E 100 UNIT capsule Take 100 Units by mouth daily.    [provider]    ALLERGIES:  Allergies  Allergen Reactions  . Almond Oil Hives and Shortness Of Breath  . Other Anaphylaxis    Almonds, tree nuts  . Aspirin Hives and Nausea Only    Stomach cramps  . Hydromorphone Hcl Other (See Comments)    Caused Enzyme levels to go up  . Penicillins Hives and Swelling    Stomach cramps  . Acetaminophen Other (See Comments)  Has autoimmune hepatitis, wants to avoid APAP  . Corticosteroids Other (See Comments)    Had acute pancreatitis x 2 after steroid use.  . Imitrex [Sumatriptan] Swelling    Injection caused swelling  . Stadol [Butorphanol] Other (See Comments)    hallucinates  . Topamax [Topiramate] Other (See Comments)    Kidney stones  . Vancomycin Hives  . Azathioprine Rash  . Pepcid [Famotidine] Nausea And Vomiting    Makes acid worse  . Prednisone Nausea And Vomiting    All steroids causes nausea and pancreatitis     SOCIAL HISTORY:  Social History   Tobacco Use  . Smoking status: Never Smoker  . Smokeless tobacco: Never  Used  Substance Use Topics  . Alcohol use: No    FAMILY HISTORY: Family History  Problem Relation Age of Onset  . Cervical cancer Paternal Aunt   . Heart disease Paternal Grandmother   . Heart disease Mother   . Dementia Mother   . Thyroid disease Mother   . Osteoarthritis Mother   . Heart attack Father     EXAM: BP (!) 143/98   Pulse (!) 135   Temp 99.9 F (37.7 C) (Oral)   Resp 18   LMP 03/03/2013   SpO2 100%  CONSTITUTIONAL: Alert and oriented and responds appropriately to questions. Well-appearing; well-nourished HEAD: Normocephalic EYES: Conjunctivae clear, pupils appear equal, EOMI ENT: normal nose; moist mucous membranes NECK: Supple, no meningismus, no nuchal rigidity, no LAD  CARD: Regular and tachycardic; S1 and S2 appreciated; no murmurs, no clicks, no rubs, no gallops RESP: Normal chest excursion without splinting or tachypnea; breath sounds clear and equal bilaterally; no wheezes, no rhonchi, no rales, no hypoxia or respiratory distress, speaking full sentences ABD/GI: Normal bowel sounds; non-distended; soft, non-tender, no rebound, no guarding, no peritoneal signs, no hepatosplenomegaly BACK:  The back appears normal and is non-tender to palpation, there is no CVA tenderness EXT: Normal ROM in all joints; non-tender to palpation; no edema; normal capillary refill; no cyanosis, no calf tenderness or swelling    SKIN: Normal color for age and race; warm; no rash NEURO: Moves all extremities equally PSYCH: The patient's mood and manner are appropriate. Grooming and personal hygiene are appropriate.  MEDICAL DECISION MAKING: Patient here with fever, tachycardia.  Complains of cough and congestion.  Will send flu swab, obtain chest x-ray.  Will give IV fluids, ibuprofen, Zofran for symptomatic relief.  ED PROGRESS: She is labs are unremarkable.  No leukocytosis.  Chest x-ray clear.  Flu swab negative.  When walking down the hallway patient is more tachypnea,  speaking short sentences.  Given low-grade temperature, tachycardia, tachypnea, I am also concerned for pulmonary embolus.  She has no recent risk factors.  Will obtain d-dimer.  Will give DuoNeb for symptomatic relief.  8:15 AM  Patient's d-dimer is positive.  Will obtain a CTA of the chest for further evaluation.  Signed out to oncoming ED physician to follow-up on CT imaging for further disposition.  Repeat lactate has improved.  I reviewed all nursing notes, vitals, pertinent previous records, EKGs, lab and urine results, imaging (as available).    EKG Interpretation  Date/Time:  Friday May 23 2018 05:51:24 EDT Ventricular Rate:  102 PR Interval:    QRS Duration: 84 QT Interval:  343 QTC Calculation: 447 R Axis:   65 Text Interpretation:  Sinus tachycardia Borderline T wave abnormalities No significant change since last tracing Confirmed by Carlyle Achenbach, Cyril Mourning [id number]) on 05/23/2018 6:12:15 AM  Jaimi Belle, Delice Bison, DO 05/23/18 [phone]

## 2018-05-23 NOTE — ED Triage Notes (Signed)
Pt from home with c/o cough and fever x over a week. Pt was seen by PCP who thinks she is healing from having the flu. Pt states she has had a persistent productive cough with yellow sputum. Pt states she has a rescue inhaler at home for her asthma which she has been using in combination with a z pack that she finished, Levaquin that she just started, a steroid from her PCP and tessalon pearls. Pt states she has been taking motrin at home for fever. Pt also reports she has had rectal bleed and diarrhea x 1 month. Pt states she has been evaluated by her PCP and is planning on getting a colonoscopy, but they will not do the procedure until her current symptoms are resolved.  Pt has clear lung sounds

## 2018-05-28 DIAGNOSIS — E119 Type 2 diabetes mellitus without complications: Secondary | ICD-10-CM | POA: Diagnosis not present

## 2018-05-28 DIAGNOSIS — R05 Cough: Secondary | ICD-10-CM | POA: Diagnosis not present

## 2018-05-28 DIAGNOSIS — R509 Fever, unspecified: Secondary | ICD-10-CM | POA: Diagnosis not present

## 2018-05-28 DIAGNOSIS — J45909 Unspecified asthma, uncomplicated: Secondary | ICD-10-CM | POA: Diagnosis not present

## 2018-06-04 ENCOUNTER — Encounter: Payer: Self-pay | Admitting: Family Medicine

## 2018-06-04 DIAGNOSIS — K529 Noninfective gastroenteritis and colitis, unspecified: Secondary | ICD-10-CM | POA: Diagnosis not present

## 2018-06-04 DIAGNOSIS — K5190 Ulcerative colitis, unspecified, without complications: Secondary | ICD-10-CM | POA: Diagnosis not present

## 2018-06-04 DIAGNOSIS — R194 Change in bowel habit: Secondary | ICD-10-CM | POA: Diagnosis not present

## 2018-06-04 DIAGNOSIS — Z1211 Encounter for screening for malignant neoplasm of colon: Secondary | ICD-10-CM | POA: Diagnosis not present

## 2018-06-10 DIAGNOSIS — E039 Hypothyroidism, unspecified: Secondary | ICD-10-CM | POA: Diagnosis not present

## 2018-06-10 DIAGNOSIS — R634 Abnormal weight loss: Secondary | ICD-10-CM | POA: Diagnosis not present

## 2018-06-12 ENCOUNTER — Institutional Professional Consult (permissible substitution): Payer: BLUE CROSS/BLUE SHIELD | Admitting: Internal Medicine

## 2018-06-12 DIAGNOSIS — R768 Other specified abnormal immunological findings in serum: Secondary | ICD-10-CM | POA: Diagnosis not present

## 2018-06-12 DIAGNOSIS — M79643 Pain in unspecified hand: Secondary | ICD-10-CM | POA: Diagnosis not present

## 2018-06-12 DIAGNOSIS — M2550 Pain in unspecified joint: Secondary | ICD-10-CM | POA: Diagnosis not present

## 2018-06-12 DIAGNOSIS — M7531 Calcific tendinitis of right shoulder: Secondary | ICD-10-CM | POA: Diagnosis not present

## 2018-06-12 DIAGNOSIS — K5190 Ulcerative colitis, unspecified, without complications: Secondary | ICD-10-CM | POA: Diagnosis not present

## 2018-06-16 ENCOUNTER — Ambulatory Visit (INDEPENDENT_AMBULATORY_CARE_PROVIDER_SITE_OTHER): Payer: BLUE CROSS/BLUE SHIELD | Admitting: Internal Medicine

## 2018-06-16 ENCOUNTER — Encounter: Payer: Self-pay | Admitting: Internal Medicine

## 2018-06-16 DIAGNOSIS — Z8719 Personal history of other diseases of the digestive system: Secondary | ICD-10-CM

## 2018-06-16 DIAGNOSIS — R509 Fever, unspecified: Secondary | ICD-10-CM | POA: Insufficient documentation

## 2018-06-16 DIAGNOSIS — E0500 Thyrotoxicosis with diffuse goiter without thyrotoxic crisis or storm: Secondary | ICD-10-CM

## 2018-06-16 HISTORY — DX: Personal history of other diseases of the digestive system: Z87.19

## 2018-06-16 NOTE — Progress Notes (Signed)
[name] for Infectious [facility]  Reason for Consult: Fever of unknown origin Referring Provider: Dr. [name]  Assessment: The cause of her recent febrile illness is not entirely clear.  The findings on her recent colonoscopy suggests that her ulcerative colitis may be flaring but she has never had fever due to her ulcerative colitis before.  I will order stool studies to make sure she is not having infectious colitis.  Recent blood cultures were negative but were obtained one day after stopping antibiotics so I will repeat them today.  I am not sure what explains the punctate lesion on her right tonsil.  She will follow-up with her ENT provider as soon as possible for further evaluation.  She did have absolute eosinophilia when seen in the emergency department recently suggesting the possibility of an allergic/hypersensitivity reaction.  She has no real problems tolerating Lialda but this is a potential culprit.  She will follow-up in 1 week.   Plan: 1. Observe off of antibiotics for now 2. Repeat blood cultures x2 3. CBC with differential 4. QuantiFERON TB Gold assay 5. HIV antibody 6. Stool for C. difficile PCR and GI panel 7. ENT evaluation 8. Follow-up in 1 week  Patient Active Problem List   Diagnosis Date Noted  . Fever of unknown origin (FUO) 06/16/2018    Priority: High  . Graves disease 06/16/2018  . Hx of pancreatitis 06/16/2018  . Impingement syndrome of right shoulder 11/29/2016  . Pure hypercholesterolemia 12/22/2013  . Hemorrhage of rectum and anus 09/21/2012  . Autoimmune hepatitis ([place]) 08/16/2012  . Transaminitis 08/15/2012  . History of anemia 03/31/2012  . Asthma 03/31/2012  . Osteopenia 03/31/2012  . Ulcerative colitis ([place]) 03/31/2012  . History of renal calculi 03/31/2012  . History of migraine 03/31/2012  . Mitral valve prolapse   . Orthostatic hypotension     Patient's Medications  New Prescriptions   No medications on file    Previous Medications   ALBUTEROL (PROVENTIL HFA;VENTOLIN HFA) 108 (90 BASE) MCG/ACT INHALER    Inhale 2 puffs into the lungs every 6 (six) hours as needed for wheezing or shortness of breath.   ALBUTEROL SULFATE (PROAIR RESPICLICK) 222 (90 BASE) MCG/ACT AEPB    Inhale 1 puff into the lungs every 4 (four) hours as needed.   BENZONATATE (TESSALON) 200 MG CAPSULE    Take 200 mg by mouth 3 (three) times daily as needed for cough.   BUTALBITAL-ACETAMINOPHEN-CAFFEINE (FIORICET, ESGIC) 50-325-40 MG TABLET    One tablet once daily as needed for migraine headache.  Prescription must last 30 days.   CHOLECALCIFEROL (VITAMIN D) 1000 UNITS TABLET    Take 1,000 Units by mouth daily.   CRESTOR 10 MG TABLET    Take 10 mg by mouth daily.    FLUTICASONE FUROATE-VILANTEROL (BREO ELLIPTA) 100-25 MCG/INH AEPB    Inhale 1 puff into the lungs daily.   FOLIC ACID (FOLVITE) 979 MCG TABLET    Take 800 mcg by mouth daily.   GUAIFENESIN-CODEINE 100-10 MG/5ML SYRUP    Take 10 mLs by mouth every 4 (four) hours as needed for cough.   IBUPROFEN (ADVIL,MOTRIN) 200 MG TABLET    Take 200 mg by mouth every 6 (six) hours as needed.   LEVOFLOXACIN (LEVAQUIN) 500 MG TABLET    Take 500 mg by mouth daily.   LEVOTHYROXINE (SYNTHROID, LEVOTHROID) 112 MCG TABLET    Take 112 mcg by mouth daily before breakfast.   MESALAMINE (LIALDA) 1.2  G EC TABLET    Take 2.4 g by mouth 2 (two) times daily.    ONDANSETRON (ZOFRAN ODT) 4 MG DISINTEGRATING TABLET    Take 1 tablet (4 mg total) by mouth every 8 (eight) hours as needed.  Modified Medications   No medications on file  Discontinued Medications   No medications on file    HPI: Danielle Bond is a 57 y.o. female who was diagnosed with ulcerative colitis in 2007.  She has been taking Lialda since 2013 and her colitis had been in remission.  In August she began to have abdominal pain, bloody diarrhea and intermittent fevers.  She was seen by Dr. Collene Mares, her gastroenterologist who referred her to  the emergency department.  Abdominal pelvic CT scan did not reveal any acute abnormalities.  She had previously undergone cholecystectomy.  She was scheduled for a colonoscopy but it had to be canceled on 2 occasions because of the fever and superimposed asthmatic bronchitis.  She was seen for the bronchitis on 05/17/2018 and given an injection of Decadron and started on Z-Pak.  She was seen in urgent care 3 days later and her antibiotic was changed to levofloxacin.  She was seen back in the emergency department on 05/23/2018.  Chest x-ray revealed no active disease.  Chest CT scan was also negative.  Her white blood cell count was normal at 8500 but she had slightly elevated eosinophils of 600. Influenza PCR testing was negative.  Liver enzymes and urinalysis were both normal.  She was seen in her PCP office on 05/28/2018.  Blood cultures were negative at that time.  C-reactive protein was negative.  She was seen by her endocrinologist and her LDH was minimally elevated at 248.  She underwent colonoscopy on 06/04/2018 which revealed ulcerated mucosa in the colon.  I do not have the report of biopsies but she tells me that she talked with Dr. Collene Mares by phone who told her that there was lots of inflammation seen suggesting reactivation of her ulcerative colitis.  She was told that she might need to start on a "biologic" agent but needed to be seen here to rule out any active infection.  She recently noticed a white spot on the back of her throat on the right side.  She has only minimal discomfort there.  She was evaluated last summer because of some fullness in the right supraclavicular fossa.  CT scan of her neck did not reveal any lymphadenopathy or abscess.  The report suggested that the fullness might be related to a lipoma.  Her fevers are now occurring on a daily basis up to 102.9 degrees.  She has been having rigors and drenching night sweats.  She has lost about 15 pounds unintentionally during this illness. She  is not on any new medications recently other than her recent antibiotics.  She has not had any unusual travel or animal exposures.  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  She has had negative TB skin test in the past.  Review of Systems: Review of Systems  Constitutional: Positive for chills, diaphoresis, fever, malaise/fatigue and weight loss.  HENT: Positive for sore throat. Negative for congestion.   Eyes: Positive for blurred vision.  Respiratory: Positive for cough. Negative for sputum production, shortness of breath and wheezing.   Cardiovascular: Negative for chest pain.  Gastrointestinal: Positive for abdominal pain, blood in stool, diarrhea, nausea and vomiting. Negative for heartburn.  Genitourinary: Negative for dysuria and frequency.  Musculoskeletal: Negative for  joint pain and myalgias.  Skin: Negative for rash.  Neurological: Positive for dizziness. Negative for headaches.  Psychiatric/Behavioral: Negative for depression and substance abuse. The patient is not nervous/anxious.       Past Medical History:  Diagnosis Date  . Asthma   . Chronic kidney disease   . Kidney stone   . Kidney stone   . Migraine   . Mitral valve prolapse   . Orthostatic hypotension   . Ulcerative colitis ([place]) 03/31/2012    Social History   Tobacco Use  . Smoking status: Never Smoker  . Smokeless tobacco: Never Used  Substance Use Topics  . Alcohol use: No  . Drug use: No    Family History  Problem Relation Age of Onset  . Cervical cancer Paternal Aunt   . Heart disease Paternal Grandmother   . Heart disease Mother   . Dementia Mother   . Thyroid disease Mother   . Osteoarthritis Mother   . Heart attack Father    Allergies  Allergen Reactions  . Almond Oil Hives and Shortness Of Breath  . Other Anaphylaxis    Almonds, tree nuts  . Aspirin Hives and Nausea Only    Stomach cramps  . Hydromorphone Hcl Other (See Comments)    Caused Enzyme levels to go up  .  Penicillins Hives and Swelling    Has patient had a PCN reaction causing immediate rash, facial/tongue/throat swelling, SOB or lightheadedness with hypotension: Yes Has patient had a PCN reaction causing severe rash involving mucus membranes or skin necrosis: No Has patient had a PCN reaction that required hospitalization: No Has patient had a PCN reaction occurring within the last 10 years: No  Stomach cramps If all of the above answers are "NO", then may proceed with Cephalosporin use.   . Acetaminophen Other (See Comments)    Has autoimmune hepatitis, wants to avoid APAP  . Corticosteroids Other (See Comments)    Had acute pancreatitis x 2 after steroid use.  . Imitrex [Sumatriptan] Swelling    Injection caused swelling  . Stadol [Butorphanol] Other (See Comments)    hallucinates  . Topamax [Topiramate] Other (See Comments)    Kidney stones  . Vancomycin Hives  . Azathioprine Rash  . Pepcid [Famotidine] Nausea And Vomiting    Makes acid worse  . Prednisone Nausea And Vomiting    All steroids causes nausea and pancreatitis     OBJECTIVE: Vitals:   06/16/18 1349  BP: (!) 153/82  Pulse: (!) 106  Temp: 98.2 F (36.8 C)  Weight: 147 lb (66.7 kg)  Height: 5' (1.524 m)   Body mass index is 28.71 kg/m.   Physical Exam  Constitutional: She is oriented to person, place, and time.  She appears slightly worried but otherwise in no distress. She is accompanied by her husband.  HENT:  Mouth/Throat:    She has a healed anterior neck incision from previous thyroidectomy.  She has slight tenderness palpation of the right side of her neck anteriorly but no palpable adenopathy.  Eyes: Conjunctivae are normal.  Cardiovascular: Regular rhythm and normal heart sounds.  No murmur heard. She is tachycardic.  Pulmonary/Chest: Effort normal and breath sounds normal. She has no wheezes. She has no rales.  Abdominal: Soft. She exhibits no distension and no mass. There is no tenderness.   Musculoskeletal: Normal range of motion. She exhibits no edema or tenderness.  Lymphadenopathy:       Head (right side): No submental and no submandibular adenopathy present.  Head (left side): No submental and no submandibular adenopathy present.    She has no cervical adenopathy.    She has no axillary adenopathy.       Right: No inguinal and no epitrochlear adenopathy present.       Left: No inguinal and no epitrochlear adenopathy present.  Neurological: She is alert and oriented to person, place, and time.  Skin: No rash noted.  Psychiatric: She has a normal mood and affect.    Microbiology: No results found for this or any previous visit (from the past 240 hour(s)).  Michel Bickers, MD [facility] [phone] pager   [phone] cell 06/16/2018, 2:35 PM

## 2018-06-16 NOTE — Progress Notes (Signed)
Patient states unknown episodes of fevers, night sweats, nausea, and "explosive diarrhea"  3-5 times a day all symptoms ongoing since Sept.

## 2018-06-16 NOTE — Assessment & Plan Note (Signed)
The cause of her recent febrile illness is not entirely clear.  The findings on her recent colonoscopy suggests that her ulcerative colitis may be flaring but she has never had fever due to her ulcerative colitis before.  I will order stool studies to make sure she is not having infectious colitis.  Recent blood cultures were negative but were obtained one day after stopping antibiotics so I will repeat them today.  I am not sure what explains the punctate lesion on her right tonsil.  She will follow-up with her ENT provider as soon as possible for further evaluation.  She did have absolute eosinophilia when seen in the emergency department recently suggesting the possibility of an allergic/hypersensitivity reaction.  She has no real problems tolerating Lialda but this is a potential culprit.  She will follow-up in 1 week.

## 2018-06-18 ENCOUNTER — Other Ambulatory Visit: Payer: BLUE CROSS/BLUE SHIELD

## 2018-06-18 ENCOUNTER — Encounter: Payer: Self-pay | Admitting: Internal Medicine

## 2018-06-18 ENCOUNTER — Ambulatory Visit (INDEPENDENT_AMBULATORY_CARE_PROVIDER_SITE_OTHER): Payer: BLUE CROSS/BLUE SHIELD | Admitting: Internal Medicine

## 2018-06-18 ENCOUNTER — Telehealth: Payer: Self-pay | Admitting: Internal Medicine

## 2018-06-18 VITALS — BP 132/82 | HR 97 | Ht 60.0 in | Wt 149.6 lb

## 2018-06-18 DIAGNOSIS — Z8719 Personal history of other diseases of the digestive system: Secondary | ICD-10-CM

## 2018-06-18 DIAGNOSIS — R768 Other specified abnormal immunological findings in serum: Secondary | ICD-10-CM

## 2018-06-18 DIAGNOSIS — R5383 Other fatigue: Secondary | ICD-10-CM | POA: Diagnosis not present

## 2018-06-18 DIAGNOSIS — D721 Eosinophilia, unspecified: Secondary | ICD-10-CM

## 2018-06-18 DIAGNOSIS — R509 Fever, unspecified: Secondary | ICD-10-CM

## 2018-06-18 DIAGNOSIS — R7689 Other specified abnormal immunological findings in serum: Secondary | ICD-10-CM

## 2018-06-18 DIAGNOSIS — R05 Cough: Secondary | ICD-10-CM

## 2018-06-18 DIAGNOSIS — R059 Cough, unspecified: Secondary | ICD-10-CM

## 2018-06-18 LAB — CBC WITH DIFFERENTIAL/PLATELET
BASOS ABS: 81 {cells}/uL (ref 0–200)
Basophils Absolute: 0.1 10*3/uL (ref 0.0–0.1)
Basophils Relative: 1.1 %
Basophils Relative: 0.7 % (ref 0.0–3.0)
EOS PCT: 11 % — AB (ref 0.0–5.0)
Eosinophils Absolute: 385 cells/uL (ref 15–500)
Eosinophils Absolute: 0.9 10*3/uL — ABNORMAL HIGH (ref 0.0–0.7)
Eosinophils Relative: 5.2 %
HCT: 35.5 % — ABNORMAL LOW (ref 36.0–46.0)
HEMATOCRIT: 36.5 % (ref 35.0–45.0)
HEMOGLOBIN: 12.1 g/dL (ref 11.7–15.5)
HEMOGLOBIN: 11.6 g/dL — AB (ref 12.0–15.0)
LYMPHS ABS: 2627 {cells}/uL (ref 850–3900)
LYMPHS PCT: 33.2 % (ref 12.0–46.0)
Lymphs Abs: 2.7 10*3/uL (ref 0.7–4.0)
MCH: 29.4 pg (ref 27.0–33.0)
MCHC: 33.2 g/dL (ref 32.0–36.0)
MCHC: 32.8 g/dL (ref 30.0–36.0)
MCV: 88.6 fL (ref 80.0–100.0)
MCV: 89.6 fl (ref 78.0–100.0)
MONOS PCT: 7.9 % (ref 3.0–12.0)
MPV: 9.5 fL (ref 7.5–12.5)
Monocytes Absolute: 0.6 10*3/uL (ref 0.1–1.0)
Monocytes Relative: 7.6 %
NEUTROS ABS: 3744 {cells}/uL (ref 1500–7800)
Neutro Abs: 3.8 10*3/uL (ref 1.4–7.7)
Neutrophils Relative %: 50.6 %
Neutrophils Relative %: 47.2 % (ref 43.0–77.0)
Platelets: 391 10*3/uL (ref 140–400)
Platelets: 353 10*3/uL (ref 150.0–400.0)
RBC: 4.12 10*6/uL (ref 3.80–5.10)
RBC: 3.96 Mil/uL (ref 3.87–5.11)
RDW: 12.1 % (ref 11.0–15.0)
RDW: 13.5 % (ref 11.5–15.5)
Total Lymphocyte: 35.5 %
WBC mixed population: 562 cells/uL (ref 200–950)
WBC: 7.4 10*3/uL (ref 3.8–10.8)
WBC: 8 10*3/uL (ref 4.0–10.5)

## 2018-06-18 LAB — QUANTIFERON-TB GOLD PLUS
Mitogen-NIL: 10 IU/mL
NIL: 0.07 IU/mL
QuantiFERON-TB Gold Plus: NEGATIVE
TB1-NIL: 0 IU/mL
TB2-NIL: 0 IU/mL

## 2018-06-18 LAB — NITRIC OXIDE: Nitric Oxide: 21

## 2018-06-18 LAB — HIV ANTIBODY (ROUTINE TESTING W REFLEX): HIV: NONREACTIVE

## 2018-06-18 NOTE — Patient Instructions (Addendum)
ICD-10-CM   1. Cough R05 Nitric oxide  2. Eosinophilia D72.1   3. FUO (fever of unknown origin) R50.9   4. Other fatigue R53.83   5. ANA positive R76.8   6. History of chronic ulcerative colitis Z87.19     I am concerned that all of this represents ulcerative colitis getting worse and also possibly Lialda related loss of efficacy/allergic reaction  Although Lialda related eosinophilia causes pulmonary infiltrates -and I do not see this.  Therefore if this is from [place] it is somewhat unusual  Glad autoimmune disease has been ruled out but we need to ensure there is no vasculitis  Need to also rule out environmental allergies  Plan -Do blood allergy work-up with CBC with differential and blood IgE -Sign record release from rheumatology specifically autoimmune panel -if needed we will get vasculitis panel after I review this -  Start/Take generic fluticasone inhaler 2 squirts each nostril daily - Continue acid reflux  Medication - continue breo daily - Will talk to Dr Juanita Craver to consider stopping lialda - Would support biologic for ulcerative colitis if infectious, autoimmune and vasculitis workup negative  Followup  - 1-2 months   - call any questions  -

## 2018-06-18 NOTE — Telephone Encounter (Addendum)
Autoimmune panel from Dr Kathlene November done 06/12/18 reviewed -  Noticed vasculitis panel not done  Plan  - do Anti-GBM, MPO antibody, PR-3 antibody and repeat ANA - please see if they can add on to today's blood work . Or else she needs to come back

## 2018-06-18 NOTE — Addendum Note (Signed)
Addended by: Raliegh Ip on: 06/18/2018 10:17 AM   Modules accepted: Orders

## 2018-06-18 NOTE — Progress Notes (Addendum)
Subjective:    Patient ID: Danielle Bond, female    DOB: October 17, 1960, 57 y.o.   MRN: [id number]  HPI   PCP Jani Gravel, MD  Neurologist-Dr. Krista Blue Infectious diseases Dr. Michel Bickers Orthopedic for right shoulder-Dr. Hildred Priest 06/18/2018  Chief Complaint  Patient presents with  . Pulmonary Consult    asthma, [name], NP referred her, she went to the ER and she had bronchitis with fever, she has constant fever and she has seen Infectious DIsease for this, she has a dry hacking cough    57 year old female with a chronic complex set of medical problems that includes hyperlipidemia, Graves' disease, status post thyroidectomy, ulcerative colitis, kidney stone and history of acute pancreatitis.  She also has long history of migraine headaches.  She presents with her husband for respiratory issues in the context of fever of unknown origin and bloody stools  She tells me that she was diagnosed with ulcerative colitis in [place] in 2007 when the used to live [place].  In 2013 she migrated to [place], [place] and had a bad flareup of ulcerative colitis for which she was admitted.  Since then she has been under the care of Dr. Collene Mares gastroenterologist and she has been on Lialda.  Overall she is doing really wel0 .then some few years ago she was started on Brio for diagnosis of asthma.  Details of this is unclear.  Overall doing well since then till September 2019 when she felt the Lialda stopped working on a subjective basis.  This is because since then every time she takes the Lialda she has abdominal pain.  She also has fever at night on a daily basis.  She also has bloody stools at least 7 times a day.  She has abdominal pain when she takes the [place].  Per her report Dr. Collene Mares has done a colonoscopy with biopsy that showed inflammation.  Therefore biologic is being considered.  However she is also increasing fatigue and shortness of breath associated with it.  Is mostly present  on exertion relieved by rest.  Is also wheezing.  Overall she has had a significant deterioration.  Then towards the end of October 2019 she felt she had an asthma flareup and reported to the urgent care center where she was treated acutely.  But she represented within a week or 10 days to the ER and was given a diagnosis of acute bronchitis.  CT angiogram ruled out pulmonary embolism.  All these records have been reviewed by me and the images and results visualized.  She subsequently has seen Dr. Michel Bickers infectious diseases this week.  I reviewed his findings.  He is worried that the Lialda is causing eosinophilia.  He does not suspect infectious disease as a reason for the fever quantified on gold and HIV are negative.  Further blood results are pending.  Patient says she has had an extensive work-up.  She is also seen [facility] rheumatology department Dr. Joellyn Rued who apparently has done an extensive autoimmune panel and this is all negative.  Unclear is if vasculitis work-up has been done that includes ANCA and GBM.  Patient is really frustrated by the whole constellation of symptoms.  At this point in time a biologic agent is being considered for ulcerative colitis but apparently the fever is being considered is unusual in the setting of ulcerative colitis and therefore she is going through the work-up  Exam nitric oxide is 21 ppb and this is  on Breo today and is normal.  Recent labs June 12, 2018 done at [facility] rheumatology: Sjogren antibody centromere antibody, Jo 1 antibody, chromatin antibody, SSA, SSB, anti-scleroderma antibody, Smith antibody, double-stranded DNA antibody and RNP antibody and ESR all normal.  TSH is normal.  Hemoglobin is 12 g%.  Your sniffles continue to be high at 13.6% of her total white count of 7400.  Creatinine is normal.  Urine analysis appears normal.  Results for TANAYAH, SQUITIERI (MRN [id number]) as of 06/18/2018 09:18   Ref. Range 11/27/2013 12:40 03/10/2014 18:52 03/16/2014 16:42 05/23/2018 05:31 06/16/2018 15:10  Eosinophils Absolute Latest Ref Range: 15 - 500 cells/uL 0.2 0.4 0.2 0.6 (H) 385   Results for VALENTINA, ALCOSER (MRN [id number]) as of 06/18/2018 09:18  Ref. Range 10/05/2013 14:36 03/16/2014 16:42  Anti Nuclear Antibody(ANA) Latest Ref Range: NEGATIVE  POS (A)   ANA Pattern 1 Unknown HOMOGENOUS (A)   ANA Titer 1 Latest Ref Range: <1:40   1:1280 (H)   ds DNA Ab Latest Units: IU/mL  1  C3 Complement Latest Ref Range: 90 - 180 mg/dL  116  C4 Complement Latest Ref Range: 10 - 40 mg/dL  18   Most recent CT scan of the chest angiogram May 23, 2018: Personally visualized.  Lung fields look fairly benign.   has a past medical history of Asthma, Chronic kidney disease, Kidney stone, Kidney stone, Migraine, Mitral valve prolapse, Orthostatic hypotension, and Ulcerative colitis ([place]) (03/31/2012).   reports that she has never smoked. She has never used smokeless tobacco.  Past Surgical History:  Procedure Laterality Date  . CESAREAN SECTION    . CHOLECYSTECTOMY    . COLONOSCOPY    . THYROIDECTOMY      Allergies  Allergen Reactions  . Almond Oil Hives and Shortness Of Breath  . Other Anaphylaxis    Almonds, tree nuts  . Aspirin Hives and Nausea Only    Stomach cramps  . Hydromorphone Hcl Other (See Comments)    Caused Enzyme levels to go up  . Penicillins Hives and Swelling    Has patient had a PCN reaction causing immediate rash, facial/tongue/throat swelling, SOB or lightheadedness with hypotension: Yes Has patient had a PCN reaction causing severe rash involving mucus membranes or skin necrosis: No Has patient had a PCN reaction that required hospitalization: No Has patient had a PCN reaction occurring within the last 10 years: No  Stomach cramps If all of the above answers are "NO", then may proceed with Cephalosporin use.   . Acetaminophen Other (See Comments)    Has autoimmune hepatitis,  wants to avoid APAP  . Corticosteroids Other (See Comments)    Had acute pancreatitis x 2 after steroid use.  . Imitrex [Sumatriptan] Swelling    Injection caused swelling  . Stadol [Butorphanol] Other (See Comments)    hallucinates  . Topamax [Topiramate] Other (See Comments)    Kidney stones  . Vancomycin Hives  . Azathioprine Rash  . Pepcid [Famotidine] Nausea And Vomiting    Makes acid worse  . Prednisone Nausea And Vomiting    All steroids causes nausea and pancreatitis     Immunization History  Administered Date(s) Administered  . Influenza Inj Mdck Quad Pf 03/23/2018  . Influenza Split 04/10/2012, 04/24/2013  . Influenza, Seasonal, Injecte, Preservative Fre 04/15/2014  . PPD Test 10/05/2013  . Pneumococcal Polysaccharide-23 04/22/2008, 04/23/2013  . Tdap 07/23/2004, 07/22/2012  . Varicella 07/22/2012    Family History  Problem Relation Age of Onset  .  Cervical cancer Paternal Aunt   . Heart disease Paternal Grandmother   . Heart disease Mother   . Dementia Mother   . Thyroid disease Mother   . Osteoarthritis Mother   . Heart attack Father      Current Outpatient Medications:  .  albuterol (PROVENTIL HFA;VENTOLIN HFA) 108 (90 Base) MCG/ACT inhaler, Inhale 2 puffs into the lungs every 6 (six) hours as needed for wheezing or shortness of breath., Disp: , Rfl:  .  Albuterol Sulfate (PROAIR RESPICLICK) 299 (90 Base) MCG/ACT AEPB, Inhale 1 puff into the lungs every 4 (four) hours as needed., Disp: 1 each, Rfl: 0 .  cholecalciferol (VITAMIN D) 1000 units tablet, Take 1,000 Units by mouth daily., Disp: , Rfl:  .  CRESTOR 10 MG tablet, Take 10 mg by mouth daily. , Disp: , Rfl: 1 .  fluticasone furoate-vilanterol (BREO ELLIPTA) 100-25 MCG/INH AEPB, Inhale 1 puff into the lungs daily., Disp: , Rfl:  .  folic acid (FOLVITE) 242 MCG tablet, Take 800 mcg by mouth daily., Disp: , Rfl:  .  guaiFENesin-codeine 100-10 MG/5ML syrup, Take 10 mLs by mouth every 4 (four) hours as  needed for cough., Disp: 120 mL, Rfl: 0 .  ibuprofen (ADVIL,MOTRIN) 200 MG tablet, Take 200 mg by mouth every 6 (six) hours as needed., Disp: , Rfl:  .  levothyroxine (SYNTHROID, LEVOTHROID) 112 MCG tablet, Take 112 mcg by mouth daily before breakfast., Disp: , Rfl:  .  mesalamine (LIALDA) 1.2 G EC tablet, Take 2.4 g by mouth 2 (two) times daily. , Disp: , Rfl:  .  ondansetron (ZOFRAN ODT) 4 MG disintegrating tablet, Take 1 tablet (4 mg total) by mouth every 8 (eight) hours as needed., Disp: 20 tablet, Rfl: 6    Review of Systems  Constitutional: Positive for fever and unexpected weight change.  HENT: Positive for trouble swallowing. Negative for congestion, dental problem, ear pain, nosebleeds, postnasal drip, rhinorrhea, sinus pressure, sneezing and sore throat.   Eyes: Negative for redness and itching.  Respiratory: Positive for cough and shortness of breath. Negative for chest tightness and wheezing.   Cardiovascular: Negative for palpitations and leg swelling.  Gastrointestinal: Negative for nausea and vomiting.  Genitourinary: Negative for dysuria.  Musculoskeletal: Negative for joint swelling.  Skin: Negative for rash.  Neurological: Positive for headaches.  Hematological: Does not bruise/bleed easily.  Psychiatric/Behavioral: Negative for dysphoric mood. The patient is not nervous/anxious.        Objective:   Physical Exam  Constitutional: She is oriented to person, place, and time. She appears well-developed and well-nourished. No distress.  HENT:  Head: Normocephalic and atraumatic.  Right Ear: External ear normal.  Left Ear: External ear normal.  Mouth/Throat: Oropharynx is clear and moist. No oropharyngeal exudate.  Eyes: Pupils are equal, round, and reactive to light. Conjunctivae and EOM are normal. Right eye exhibits no discharge. Left eye exhibits no discharge. No scleral icterus.  Neck: Normal range of motion. Neck supple. No JVD present. No tracheal deviation  present. No thyromegaly present.  Cardiovascular: Normal rate, regular rhythm, normal heart sounds and intact distal pulses. Exam reveals no gallop and no friction rub.  No murmur heard. Pulmonary/Chest: Effort normal and breath sounds normal. No respiratory distress. She has no wheezes. She has no rales. She exhibits no tenderness.  Abdominal: Soft. Bowel sounds are normal. She exhibits no distension and no mass. There is no tenderness. There is no rebound and no guarding.  Musculoskeletal: Normal range of motion. She exhibits no  edema or tenderness.  Lymphadenopathy:    She has no cervical adenopathy.  Neurological: She is alert and oriented to person, place, and time. She has normal reflexes. No cranial nerve deficit. She exhibits normal muscle tone. Coordination normal.  Skin: Skin is warm and dry. No rash noted. She is not diaphoretic. No erythema. No pallor.  Psychiatric: She has a normal mood and affect. Her behavior is normal. Judgment and thought content normal.  Vitals reviewed.   Vitals:   06/18/18 0911  BP: 132/82  Pulse: 97  SpO2: 99%  Weight: 149 lb 9.6 oz (67.9 kg)  Height: 5' (1.524 m)    Estimated body mass index is 29.22 kg/m as calculated from the following:   Height as of this encounter: 5' (1.524 m).   Weight as of this encounter: 149 lb 9.6 oz (67.9 kg).       Assessment & Plan:     ICD-10-CM   1. Cough R05 Nitric oxide  2. Eosinophilia D72.1   3. FUO (fever of unknown origin) R50.9   4. Other fatigue R53.83   5. ANA positive R76.8   6. History of chronic ulcerative colitis Z87.19     Patient Instructions     ICD-10-CM   1. Cough R05 Nitric oxide  2. Eosinophilia D72.1   3. FUO (fever of unknown origin) R50.9   4. Other fatigue R53.83   5. ANA positive R76.8   6. History of chronic ulcerative colitis Z87.19     I am concerned that all of this represents ulcerative colitis getting worse and also possibly Lialda related loss of  efficacy/allergic reaction  Although Lialda related eosinophilia causes pulmonary infiltrates -and I do not see this.  Therefore if this is from [place] it is somewhat unusual  Glad autoimmune disease has been ruled out but we need to ensure there is no vasculitis  Need to also rule out environmental allergies  Plan -Do blood allergy work-up with CBC with differential and blood IgE -Sign record release from rheumatology specifically autoimmune panel -if needed we will get vasculitis panel after I review this -  Start/Take generic fluticasone inhaler 2 squirts each nostril daily - Continue acid reflux  Medication - continue breo daily - Will talk to Dr Juanita Craver to consider stopping lialda - Would support biologic for ulcerative colitis if infectious, autoimmune and vasculitis workup negative  Followup  - 1-2 months   - call any questions  -       SIGNATURE    Dr. Brand Males, M.D., F.C.C.P,  Pulmonary and Critical Care Medicine Staff Physician, [place] Director - Interstitial Lung Disease  Program  [facility] [place] [facility] [place], [place], [address]  Pager: [phone], If no answer or between  15:00h - 7:00h: call [phone] Telephone: [phone]  10:06 AM 06/18/2018

## 2018-06-18 NOTE — Addendum Note (Signed)
Addended by: Parke Poisson E on: 06/18/2018 10:16 AM   Modules accepted: Orders

## 2018-06-20 LAB — IGE: IgE (Immunoglobulin E), Serum: 74 kU/L (ref <=114)

## 2018-06-22 LAB — CLOSTRIDIUM DIFFICILE CULTURE-FECAL

## 2018-06-22 LAB — GASTROINTESTINAL PATHOGEN PANEL PCR
C. DIFFICILE TOX A/B, PCR: NOT DETECTED
Campylobacter, PCR: NOT DETECTED
Cryptosporidium, PCR: NOT DETECTED
E COLI (ETEC) LT/ST, PCR: NOT DETECTED
E COLI (STEC) STX1/STX2, PCR: NOT DETECTED
E coli 0157, PCR: NOT DETECTED
Giardia lamblia, PCR: NOT DETECTED
NOROVIRUS, PCR: NOT DETECTED
ROTAVIRUS, PCR: NOT DETECTED
SALMONELLA, PCR: NOT DETECTED
Shigella, PCR: NOT DETECTED

## 2018-06-23 DIAGNOSIS — J358 Other chronic diseases of tonsils and adenoids: Secondary | ICD-10-CM | POA: Diagnosis not present

## 2018-06-24 LAB — CULTURE BLOOD MANUAL
Micro Number: 91424363
Micro Number: 91424364
RESULT 123: NO GROWTH
Result: NO GROWTH
SPECIMEN QUALITY 12: ADEQUATE
Specimen Quality: ADEQUATE

## 2018-06-24 NOTE — Telephone Encounter (Signed)
Patient calling for lab results, CB is [phone]. Patient states she is going to Infectious Disease doctor this week and would like results before her appt.

## 2018-06-24 NOTE — Telephone Encounter (Signed)
Called pt and advised message from the provider. Pt understood and verbalized understanding. Nothing further is needed.   Labs ordered

## 2018-06-25 ENCOUNTER — Emergency Department (HOSPITAL_COMMUNITY)
Admission: EM | Admit: 2018-06-25 | Discharge: 2018-06-26 | Disposition: A | Discharge status: Home / Self Care | Payer: BLUE CROSS/BLUE SHIELD | Attending: Emergency Medicine | Admitting: Emergency Medicine

## 2018-06-25 ENCOUNTER — Telehealth: Payer: Self-pay | Admitting: *Deleted

## 2018-06-25 ENCOUNTER — Encounter (HOSPITAL_COMMUNITY): Payer: Self-pay

## 2018-06-25 ENCOUNTER — Other Ambulatory Visit: Payer: Self-pay

## 2018-06-25 DIAGNOSIS — R112 Nausea with vomiting, unspecified: Secondary | ICD-10-CM | POA: Insufficient documentation

## 2018-06-25 DIAGNOSIS — N189 Chronic kidney disease, unspecified: Secondary | ICD-10-CM | POA: Diagnosis not present

## 2018-06-25 DIAGNOSIS — J45909 Unspecified asthma, uncomplicated: Secondary | ICD-10-CM | POA: Insufficient documentation

## 2018-06-25 DIAGNOSIS — R509 Fever, unspecified: Secondary | ICD-10-CM | POA: Diagnosis not present

## 2018-06-25 DIAGNOSIS — Z79899 Other long term (current) drug therapy: Secondary | ICD-10-CM | POA: Insufficient documentation

## 2018-06-25 DIAGNOSIS — R000 Tachycardia, unspecified: Secondary | ICD-10-CM | POA: Diagnosis not present

## 2018-06-25 DIAGNOSIS — R1084 Generalized abdominal pain: Secondary | ICD-10-CM | POA: Diagnosis not present

## 2018-06-25 DIAGNOSIS — K921 Melena: Secondary | ICD-10-CM | POA: Insufficient documentation

## 2018-06-25 DIAGNOSIS — R52 Pain, unspecified: Secondary | ICD-10-CM | POA: Diagnosis not present

## 2018-06-25 DIAGNOSIS — R109 Unspecified abdominal pain: Secondary | ICD-10-CM | POA: Insufficient documentation

## 2018-06-25 NOTE — Telephone Encounter (Signed)
Patient wanted Dr Megan Salon to know that her fever returned last night. She had a temperature of 102.4 last night with heavy sweats overnight (changed her clothes 5 times overnight), temperature 101.1 this morning. She has been taking advil for temperature, states she is passing bloody clots in her stool but has been on the phone with her GI doctor all day regarding this.  She states she has not had a normal stool since September. She reports excruciating pain, states she is so glad she will see ID tomorrow.  She states GI has not given her parameters for ED but she states she has a low threshold to go to the emergency room.  Per patient's GI doctor, she will now take tylenol instead of advil. Landis Gandy, RN

## 2018-06-25 NOTE — ED Triage Notes (Signed)
Pt bib [facility] for chronic abdominal pain, and passing blood clots in her stools. She states she hasn't had any normal stools for months.

## 2018-06-26 ENCOUNTER — Encounter: Payer: Self-pay | Admitting: Internal Medicine

## 2018-06-26 ENCOUNTER — Ambulatory Visit (INDEPENDENT_AMBULATORY_CARE_PROVIDER_SITE_OTHER): Payer: BLUE CROSS/BLUE SHIELD | Admitting: Internal Medicine

## 2018-06-26 DIAGNOSIS — R509 Fever, unspecified: Secondary | ICD-10-CM

## 2018-06-26 DIAGNOSIS — K51011 Ulcerative (chronic) pancolitis with rectal bleeding: Secondary | ICD-10-CM | POA: Diagnosis not present

## 2018-06-26 DIAGNOSIS — Z8719 Personal history of other diseases of the digestive system: Secondary | ICD-10-CM

## 2018-06-26 LAB — COMPREHENSIVE METABOLIC PANEL
ALK PHOS: 78 U/L (ref 38–126)
ALT: 20 U/L (ref 0–44)
AST: 33 U/L (ref 15–41)
Albumin: 3.9 g/dL (ref 3.5–5.0)
Anion gap: 9 (ref 5–15)
BUN: 12 mg/dL (ref 6–20)
CALCIUM: 8.8 mg/dL — AB (ref 8.9–10.3)
CHLORIDE: 105 mmol/L (ref 98–111)
CO2: 24 mmol/L (ref 22–32)
CREATININE: 0.71 mg/dL (ref 0.44–1.00)
GFR calc Af Amer: >60 mL/min (ref >60)
Glucose, Bld: 135 mg/dL — ABNORMAL HIGH (ref 70–99)
Potassium: 3.7 mmol/L (ref 3.5–5.1)
Sodium: 138 mmol/L (ref 135–145)
Total Bilirubin: 0.6 mg/dL (ref 0.3–1.2)
Total Protein: 8.1 g/dL (ref 6.5–8.1)

## 2018-06-26 LAB — URINALYSIS, ROUTINE W REFLEX MICROSCOPIC
Bilirubin Urine: NEGATIVE
Glucose, UA: NEGATIVE mg/dL
HGB URINE DIPSTICK: NEGATIVE
Ketones, ur: 5 mg/dL — AB
Leukocytes, UA: NEGATIVE
Nitrite: NEGATIVE
PROTEIN: NEGATIVE mg/dL
SPECIFIC GRAVITY, URINE: 1.011 (ref 1.005–1.030)
pH: 6 (ref 5.0–8.0)

## 2018-06-26 LAB — CBC WITH DIFFERENTIAL/PLATELET
ABS IMMATURE GRANULOCYTES: 0.02 10*3/uL (ref 0.00–0.07)
Basophils Absolute: 0.1 10*3/uL (ref 0.0–0.1)
Basophils Relative: 1 %
Eosinophils Absolute: 0.4 10*3/uL (ref 0.0–0.5)
Eosinophils Relative: 4 %
HCT: 37.5 % (ref 36.0–46.0)
HEMOGLOBIN: 11.7 g/dL — AB (ref 12.0–15.0)
IMMATURE GRANULOCYTES: 0 %
LYMPHS PCT: 36 %
Lymphs Abs: 3.4 10*3/uL (ref 0.7–4.0)
MCH: 29.3 pg (ref 26.0–34.0)
MCHC: 31.2 g/dL (ref 30.0–36.0)
MCV: 94 fL (ref 80.0–100.0)
MONOS PCT: 12 %
Monocytes Absolute: 1.1 10*3/uL — ABNORMAL HIGH (ref 0.1–1.0)
Neutro Abs: 4.5 10*3/uL (ref 1.7–7.7)
Neutrophils Relative %: 47 %
Platelets: 319 10*3/uL (ref 150–400)
RBC: 3.99 MIL/uL (ref 3.87–5.11)
RDW: 12.5 % (ref 11.5–15.5)
WBC: 9.5 10*3/uL (ref 4.0–10.5)
nRBC: 0 % (ref 0.0–0.2)

## 2018-06-26 LAB — LIPASE, BLOOD: LIPASE: 54 U/L — AB (ref 11–51)

## 2018-06-26 MED ORDER — SODIUM CHLORIDE 0.9 % IV BOLUS
1000.0000 mL | Freq: Once | INTRAVENOUS | Status: AC
  Administered 2018-06-26: 1000 mL via INTRAVENOUS

## 2018-06-26 MED ORDER — DICYCLOMINE HCL 20 MG PO TABS: 10.0000 mg | ORAL_TABLET | Freq: Three times a day (TID) | ORAL | 0 refills | Status: DC

## 2018-06-26 MED ORDER — DICYCLOMINE HCL 10 MG PO CAPS
10.0000 mg | ORAL_CAPSULE | Freq: Once | ORAL | Status: AC
  Administered 2018-06-26: 10 mg via ORAL
  Filled 2018-06-26: qty 1

## 2018-06-26 NOTE — Assessment & Plan Note (Signed)
Given that her recent colonoscopy showed colonic ulcers I think this probably points toward her acute illness being due to a flare of ulcerative colitis breaking through her Lialda therapy.

## 2018-06-26 NOTE — ED Notes (Signed)
Stool specimen walked to Lab

## 2018-06-26 NOTE — Assessment & Plan Note (Signed)
So far, I have not found any clear evidence that infection is causing her protracted fevers, chills and sweats.  She had stool collected yesterday for over and parasite but I would be very surprised if a gastrointestinal parasite is causing her current illness.  She has not improved 1 week after stopping the Aldara and I am now less inclined to think that her fever and eosinophilia are due to a Lialda hypersensitivity reaction.  So far an autoimmune work-up for other conditions has been negative.  I will order ANCA and GBM antibody studies as suggested by Dr. Chase Caller.  Assuming that these pending studies are negative or nondiagnostic and she continues to have fever and bloody stools I would suggest that she follow-up with Dr. Collene Mares as soon as possible to consider other options to treat for probable ulcerative colitis.  I will have her follow-up here in 2 weeks.

## 2018-06-26 NOTE — Progress Notes (Signed)
[facility] for Infectious Disease  Patient Active Problem List   Diagnosis Date Noted  . Fever of unknown origin (FUO) 06/16/2018    Priority: High  . Graves disease 06/16/2018  . Hx of pancreatitis 06/16/2018  . Impingement syndrome of right shoulder 11/29/2016  . Pure hypercholesterolemia 12/22/2013  . Hemorrhage of rectum and anus 09/21/2012  . Autoimmune hepatitis ([place]) 08/16/2012  . History of anemia 03/31/2012  . Asthma 03/31/2012  . Osteopenia 03/31/2012  . Ulcerative colitis ([place]) 03/31/2012  . History of renal calculi 03/31/2012  . History of migraine 03/31/2012  . Mitral valve prolapse   . Orthostatic hypotension     Patient's Medications  New Prescriptions   No medications on file  Previous Medications   ACETAMINOPHEN (TYLENOL) 500 MG TABLET    Take 1,000 mg by mouth every 6 (six) hours as needed for mild pain.   ALBUTEROL SULFATE (PROAIR RESPICLICK) 191 (90 BASE) MCG/ACT AEPB    Inhale 1 puff into the lungs every 4 (four) hours as needed.   CRESTOR 10 MG TABLET    Take 10 mg by mouth daily.    DICYCLOMINE (BENTYL) 20 MG TABLET    Take 0.5 tablets (10 mg total) by mouth 3 (three) times daily before meals.   FLUTICASONE FUROATE-VILANTEROL (BREO ELLIPTA) 100-25 MCG/INH AEPB    Inhale 1 puff into the lungs daily.   GUAIFENESIN-CODEINE 100-10 MG/5ML SYRUP    Take 10 mLs by mouth every 4 (four) hours as needed for cough.   LEVOTHYROXINE (SYNTHROID, LEVOTHROID) 112 MCG TABLET    Take 112 mcg by mouth daily before breakfast.   ONDANSETRON (ZOFRAN ODT) 4 MG DISINTEGRATING TABLET    Take 1 tablet (4 mg total) by mouth every 8 (eight) hours as needed.  Modified Medications   No medications on file  Discontinued Medications   No medications on file    Subjective: Danielle Bond is in for her 1 week follow-up visit.  She is accompanied by her husband.  She does not feel any better.  She continues to have high fevers at home up to 102 degrees associated with soft  stools, diarrhea, blood clots in her stool and aching all over.  She decided to stop her Lialda 1 week ago because of concerns that it might be causing her fever and that it did not seem to be controlling her colitis.  She has not felt any better since stopping it.  She had a pulmonary evaluation by Dr. Chase Caller who pointed out that she has had a fairly thorough autoimmune work-up by her room neurologist.  Apparently she had negative studies including Sjogren antibody, centromere antibody, Jo 1 antibody, chromatin antibody, SSA, SSB, anti-scleroderma antibody, Smith antibody, double-stranded DNA antibody and RNP antibody and ESR.  He has suggested that she have ANCA and GBM antibodies tested.  She had persistent eosinophilia noted.  Because of the continued diarrhea and hematochezia she went to the emergency department yesterday.  Stool was collected for ova and parasite and is pending. Blood cultures obtained here last week off of antibiotics were negative.  Stool was negative for C. difficile and her GI panel was completely negative.  She saw Jolene Provost, her ENT provider, several days ago who felt that her tonsillar lesion was a benign mucous cyst.  Review of Systems: Review of Systems  Constitutional: Positive for chills, diaphoresis, fever and malaise/fatigue. Negative for weight loss.  HENT: Negative for sore throat.   Respiratory: Negative  for cough.   Cardiovascular: Negative for chest pain.  Gastrointestinal: Positive for abdominal pain, blood in stool, diarrhea, nausea and vomiting.  Genitourinary: Negative for dysuria.  Musculoskeletal: Positive for myalgias. Negative for joint pain.  Skin: Negative for rash.  Neurological: Negative for headaches.    Past Medical History:  Diagnosis Date  . Asthma   . Chronic kidney disease   . Kidney stone   . Kidney stone   . Migraine   . Orthostatic hypotension   . Ulcerative colitis ([place]) 03/31/2012    Social History   Tobacco Use  .  Smoking status: Never Smoker  . Smokeless tobacco: Never Used  Substance Use Topics  . Alcohol use: No  . Drug use: No    Family History  Problem Relation Age of Onset  . Cervical cancer Paternal Aunt   . Heart disease Paternal Grandmother   . Heart disease Mother   . Dementia Mother   . Thyroid disease Mother   . Osteoarthritis Mother   . Heart attack Father     Allergies  Allergen Reactions  . Almond Oil Hives and Shortness Of Breath  . Other Anaphylaxis    Almonds, tree nuts  . Aspirin Hives and Nausea Only    Stomach cramps  . Hydromorphone Hcl Other (See Comments)    Caused Enzyme levels to go up  . Penicillins Hives and Swelling    Has patient had a PCN reaction causing immediate rash, facial/tongue/throat swelling, SOB or lightheadedness with hypotension: Yes Has patient had a PCN reaction causing severe rash involving mucus membranes or skin necrosis: No Has patient had a PCN reaction that required hospitalization: No Has patient had a PCN reaction occurring within the last 10 years: No  Stomach cramps If all of the above answers are "NO", then may proceed with Cephalosporin use.   . Acetaminophen Other (See Comments)    Has autoimmune hepatitis, wants to avoid APAP  . Corticosteroids Other (See Comments)    Had acute pancreatitis x 2 after steroid use.  . Imitrex [Sumatriptan] Swelling    Injection caused swelling  . Stadol [Butorphanol] Other (See Comments)    hallucinates  . Topamax [Topiramate] Other (See Comments)    Kidney stones  . Vancomycin Hives  . Azathioprine Rash  . Pepcid [Famotidine] Nausea And Vomiting    Makes acid worse  . Prednisone Nausea And Vomiting    All steroids causes nausea and pancreatitis     Objective: Vitals:   06/26/18 1339  BP: 120/80  Pulse: (!) 105  Temp: 99 F (37.2 C)  Weight: 148 lb (67.1 kg)   Body mass index is 28.9 kg/m.  Physical Exam  Constitutional: She is oriented to person, place, and time.  She  is clearly concerned but in no acute distress.  Her weight is unchanged.  HENT:  Mouth/Throat: No oropharyngeal exudate.  A small pearly white right tonsillar lesion is unchanged.  Cardiovascular: Normal rate, regular rhythm and normal heart sounds.  No murmur heard. Pulmonary/Chest: Effort normal and breath sounds normal.  Abdominal: Soft. She exhibits distension. She exhibits no mass. There is tenderness. There is no guarding.  Neurological: She is alert and oriented to person, place, and time.  Skin: No rash noted.  Psychiatric: She has a normal mood and affect.    Lab Results    Problem List Items Addressed This Visit      High   Fever of unknown origin (FUO)    So far, I have  not found any clear evidence that infection is causing her protracted fevers, chills and sweats.  She had stool collected yesterday for over and parasite but I would be very surprised if a gastrointestinal parasite is causing her current illness.  She has not improved 1 week after stopping the Aldara and I am now less inclined to think that her fever and eosinophilia are due to a Lialda hypersensitivity reaction.  So far an autoimmune work-up for other conditions has been negative.  I will order ANCA and GBM antibody studies as suggested by Dr. Chase Caller.  Assuming that these pending studies are negative or nondiagnostic and she continues to have fever and bloody stools I would suggest that she follow-up with Dr. Collene Mares as soon as possible to consider other options to treat for probable ulcerative colitis.  I will have her follow-up here in 2 weeks.      Relevant Orders   ANCA Screen Reflex Titer   Glomerular Membrane Antibodies   Sedimentation rate   C-reactive protein   CBC w/Diff     Unprioritized   Ulcerative colitis ([place])    Given that her recent colonoscopy showed colonic ulcers I think this probably points toward her acute illness being due to a flare of ulcerative colitis breaking through her Lialda  therapy.      Hx of pancreatitis    She had very mild elevation of her lipase to 54 when she was seen in the emergency department yesterday.  I do not think her clinical illness fits acute pancreatitis.          Michel Bickers, MD [facility] [phone] pager   [phone] cell 06/26/2018, 5:09 PM

## 2018-06-26 NOTE — Assessment & Plan Note (Signed)
She had very mild elevation of her lipase to 54 when she was seen in the emergency department yesterday.  I do not think her clinical illness fits acute pancreatitis.

## 2018-06-26 NOTE — ED Provider Notes (Signed)
[facility] DEPT Provider Note  CSN: [id number] Arrival date & time: 06/25/18 2326  Chief Complaint(s) Rectal Bleeding and Abdominal Pain  HPI Danielle Bond is a 57 y.o. female with a history of ulcerative colitis who is currently being worked up by infectious disease for 3 months of almost daily fevers and night sweats.  Work-up is in the chart and negative for TB, HIV, etc. and that time.  And patient has had persistent abdominal pain with fluctuating cramping and change in bowel habits with white claylike stools that are also bloody.  Patient is being followed by her GI regarding this as well.  She has had GI panel that is negative.  She states that some of her physicians feel that it might be an allergic reaction to her ulcerative colitis medicine, attributed to elevated eosinophil count on the CBC.  Because of this she has been off of it for 1 week but still having pain.  There will also look into assess for possible vasculitis. She presents today for exacerbation of this abdominal pain.  She also reports temperature of 102.1 earlier this evening.  She reports decreased p.o. intake due to persistent nausea.  She does endorse intermittent emesis that are nonbloody nonbilious.  She denies any URI symptoms.   HPI    Past Medical History Past Medical History:  Diagnosis Date  . Asthma   . Chronic kidney disease   . Kidney stone   . Kidney stone   . Migraine   . Orthostatic hypotension   . Ulcerative colitis ([place]) 03/31/2012   Patient Active Problem List   Diagnosis Date Noted  . Graves disease 06/16/2018  . Hx of pancreatitis 06/16/2018  . Fever of unknown origin (FUO) 06/16/2018  . Impingement syndrome of right shoulder 11/29/2016  . Pure hypercholesterolemia 12/22/2013  . Hemorrhage of rectum and anus 09/21/2012  . Autoimmune hepatitis ([place]) 08/16/2012  . History of anemia 03/31/2012  . Asthma 03/31/2012  . Osteopenia 03/31/2012  . Ulcerative  colitis ([place]) 03/31/2012  . History of renal calculi 03/31/2012  . History of migraine 03/31/2012  . Mitral valve prolapse   . Orthostatic hypotension    Home Medication(s) Prior to Admission medications   Medication Sig Start Date End Date Taking? Authorizing Provider  acetaminophen (TYLENOL) 500 MG tablet Take 1,000 mg by mouth every 6 (six) hours as needed for mild pain.   Yes [provider]  Albuterol Sulfate (PROAIR RESPICLICK) 027 (90 Base) MCG/ACT AEPB Inhale 1 puff into the lungs every 4 (four) hours as needed. 05/23/18  Yes [name], MD  CRESTOR 10 MG tablet Take 10 mg by mouth daily.  08/12/14  Yes [provider]  fluticasone furoate-vilanterol (BREO ELLIPTA) 100-25 MCG/INH AEPB Inhale 1 puff into the lungs daily.   Yes [provider]  levothyroxine (SYNTHROID, LEVOTHROID) 112 MCG tablet Take 112 mcg by mouth daily before breakfast.   Yes [provider]  dicyclomine (BENTYL) 20 MG tablet Take 0.5 tablets (10 mg total) by mouth 3 (three) times daily before meals. 06/26/18   CardamaGrayce Sessions, MD  guaiFENesin-codeine 100-10 MG/5ML syrup Take 10 mLs by mouth every 4 (four) hours as needed for cough. Patient not taking: Reported on 06/26/2018 05/23/18   [name], MD  ondansetron (ZOFRAN ODT) 4 MG disintegrating tablet Take 1 tablet (4 mg total) by mouth every 8 (eight) hours as needed. Patient not taking: Reported on 06/26/2018 09/17/17   Marcial Pacas, MD  Past Surgical History Past Surgical History:  Procedure Laterality Date  . CESAREAN SECTION    . CHOLECYSTECTOMY    . COLONOSCOPY    . LITHOTRIPSY    . THYROIDECTOMY     Family History Family History  Problem Relation Age of Onset  . Cervical cancer Paternal Aunt   . Heart disease Paternal Grandmother   . Heart disease Mother   . Dementia Mother     . Thyroid disease Mother   . Osteoarthritis Mother   . Heart attack Father     Social History Social History   Tobacco Use  . Smoking status: Never Smoker  . Smokeless tobacco: Never Used  Substance Use Topics  . Alcohol use: No  . Drug use: No   Allergies Almond oil; Other; Aspirin; Hydromorphone hcl; Penicillins; Acetaminophen; Corticosteroids; Imitrex [sumatriptan]; Stadol [butorphanol]; Topamax [topiramate]; Vancomycin; Azathioprine; Pepcid [famotidine]; and Prednisone  Review of Systems Review of Systems All other systems are reviewed and are negative for acute change except as noted in the HPI  Physical Exam Vital Signs  I have reviewed the triage vital signs BP (!) 120/100 (BP Location: Right Arm)   Pulse (!) 111   Temp 98.6 F (37 C) (Oral)   Resp 20   Ht 5' (1.524 m)   Wt 65.3 kg   LMP 03/03/2013   SpO2 96%   BMI 28.12 kg/m   Physical Exam  Constitutional: She is oriented to person, place, and time. She appears well-developed and well-nourished. No distress.  HENT:  Head: Normocephalic and atraumatic.  Right Ear: External ear normal.  Left Ear: External ear normal.  Nose: Nose normal.  Eyes: Conjunctivae and EOM are normal. No scleral icterus.  Neck: Normal range of motion and phonation normal.  Cardiovascular: Normal rate and regular rhythm.  Pulmonary/Chest: Effort normal. No stridor. No respiratory distress.  Abdominal: She exhibits no distension. There is generalized tenderness (discomfort). There is no rigidity, no rebound and no guarding.  Musculoskeletal: Normal range of motion. She exhibits no edema.  Neurological: She is alert and oriented to person, place, and time.  Skin: She is not diaphoretic.  Psychiatric: She has a normal mood and affect. Her behavior is normal.  Vitals reviewed.   ED Results and Treatments Labs (all labs ordered are listed, but only abnormal results are displayed) Labs Reviewed  CBC WITH DIFFERENTIAL/PLATELET -  Abnormal; Notable for the following components:      Result Value   Hemoglobin 11.7 (*)    Monocytes Absolute 1.1 (*)    All other components within normal limits  COMPREHENSIVE METABOLIC PANEL - Abnormal; Notable for the following components:   Glucose, Bld 135 (*)    Calcium 8.8 (*)    All other components within normal limits  LIPASE, BLOOD - Abnormal; Notable for the following components:   Lipase 54 (*)    All other components within normal limits  URINALYSIS, ROUTINE W REFLEX MICROSCOPIC - Abnormal; Notable for the following components:   Ketones, ur 5 (*)    All other components within normal limits  OVA + PARASITE EXAM  EKG  EKG Interpretation  Date/Time:    Ventricular Rate:    PR Interval:    QRS Duration:   QT Interval:    QTC Calculation:   R Axis:     Text Interpretation:        Radiology No results found. Pertinent labs & imaging results that were available during my care of the patient were reviewed by me and considered in my medical decision making (see chart for details).  Medications Ordered in ED Medications  sodium chloride 0.9 % bolus 1,000 mL (0 mLs Intravenous Stopped 06/26/18 0222)  dicyclomine (BENTYL) capsule 10 mg (10 mg Oral Given 06/26/18 0141)                                                                                                                                    Procedures Procedures  (including critical care time)  Medical Decision Making / ED Course I have reviewed the nursing notes for this encounter and the patient's prior records (if available in EHR or on provided paperwork).    Work-up reassuring without leukocytosis with stable hemoglobin.  No significant electrolyte derangements or renal insufficiency.  No evidence of biliary obstruction.  Patient did have mildly elevated lipase, but not 3 times above  upper limits of normal.  She was treated symptomatically with IV fluids and Bentyl resulting in significant improvement.  Ova and parasite was sent as she has a scheduled appointment with infectious disease, and this might be available during her appointment.  No need for CT at this time.  Patient is able to tolerate oral intake.  The patient appears reasonably screened and/or stabilized for discharge and I doubt any other medical condition or other [facility] requiring further screening, evaluation, or treatment in the ED at this time prior to discharge.  The patient is safe for discharge with strict return precautions.   Final Clinical Impression(s) / ED Diagnoses Final diagnoses:  Abdominal cramping  Hematochezia   Disposition: Discharge  Condition: Good  I have discussed the results, Dx and Tx plan with the patient who expressed understanding and agree(s) with the plan. Discharge instructions discussed at great length. The patient was given strict return precautions who verbalized understanding of the instructions. No further questions at time of discharge.    ED Discharge Orders         Ordered    dicyclomine (BENTYL) 20 MG tablet  3 times daily before meals     06/26/18 0429           Follow Up: Infectious disease   As scheduled  Gastroenterology   For further work-up to assess for other etiologies of chronic diarrhea      This chart was dictated using voice recognition software.  Despite best efforts to proofread,  errors can occur which can change the documentation meaning.   Fatima Blank, MD 06/26/18 [phone]

## 2018-06-26 NOTE — Discharge Instructions (Addendum)
Discuss with your GI doctor appropriateness of pancreatic enzyme testing for possible cause of your persistent diarrhea

## 2018-06-27 LAB — CBC WITH DIFFERENTIAL/PLATELET
BASOS ABS: 73 {cells}/uL (ref 0–200)
Basophils Relative: 0.9 %
Eosinophils Absolute: 535 cells/uL — ABNORMAL HIGH (ref 15–500)
Eosinophils Relative: 6.6 %
HEMATOCRIT: 34 % — AB (ref 35.0–45.0)
Hemoglobin: 11.3 g/dL — ABNORMAL LOW (ref 11.7–15.5)
LYMPHS ABS: 3054 {cells}/uL (ref 850–3900)
MCH: 28.8 pg (ref 27.0–33.0)
MCHC: 33.2 g/dL (ref 32.0–36.0)
MCV: 86.5 fL (ref 80.0–100.0)
MPV: 10.3 fL (ref 7.5–12.5)
Monocytes Relative: 13.6 %
NEUTROS PCT: 41.2 %
Neutro Abs: 3337 cells/uL (ref 1500–7800)
Platelets: 339 10*3/uL (ref 140–400)
RBC: 3.93 10*6/uL (ref 3.80–5.10)
RDW: 12 % (ref 11.0–15.0)
Total Lymphocyte: 37.7 %
WBC mixed population: 1102 cells/uL — ABNORMAL HIGH (ref 200–950)
WBC: 8.1 10*3/uL (ref 3.8–10.8)

## 2018-06-27 LAB — ANCA SCREEN W REFLEX TITER
ANCA SCREEN: POSITIVE — AB
P-ANCA Titer: 1:40 {titer} — ABNORMAL HIGH

## 2018-06-27 LAB — SEDIMENTATION RATE: SED RATE: 50 mm/h — AB (ref 0–30)

## 2018-06-27 LAB — C-REACTIVE PROTEIN: CRP: 85.3 mg/L — AB (ref <8.0)

## 2018-06-27 LAB — GLOMERULAR BASEMENT MEMBRANE ANTIBODIES: GBM Ab: <1 AI

## 2018-06-30 LAB — OVA + PARASITE EXAM

## 2018-06-30 LAB — O&P RESULT

## 2018-07-01 ENCOUNTER — Telehealth: Payer: Self-pay | Admitting: Internal Medicine

## 2018-07-01 DIAGNOSIS — K5190 Ulcerative colitis, unspecified, without complications: Secondary | ICD-10-CM | POA: Diagnosis not present

## 2018-07-01 DIAGNOSIS — R194 Change in bowel habit: Secondary | ICD-10-CM | POA: Diagnosis not present

## 2018-07-01 DIAGNOSIS — K625 Hemorrhage of anus and rectum: Secondary | ICD-10-CM | POA: Diagnosis not present

## 2018-07-01 DIAGNOSIS — R634 Abnormal weight loss: Secondary | ICD-10-CM | POA: Diagnosis not present

## 2018-07-01 NOTE — Telephone Encounter (Signed)
Looked at pt's chart and in labs section, the ANA, MPO and Pr-3 are still showing up as active.  On 06/26/18, labs were ordered by Dr. Michel Bickers, infectious disease doctor and pt had labs drawn that he ordered 06/26/18.  Called and spoke with pt to let her know the information provided by MR and also due to the fact that he stated if the MPO and Pr-3 were not done that she would need to come by office to get labs done if she could.  When speaking with pt, she stated to me that she thought all the labwork had been done at Dr. Hale Bogus office.  MR, please advise on this for pt. After I got looking through the labs that were done, it looks like pt had the anca screen reflex. Is this lab the same as the MPO/Pr-3 as it does have anca in parentheses if you look at the lab test? If this is not the same, please advise and pt stated if she needed to come by office to have labs done, she would.

## 2018-07-01 NOTE — Telephone Encounter (Signed)
Called and spoke with pt letting her know that MR was needing  Her to  Come get labwork done due to two tests of PR-3 and MPO not being drawn.  Pt expressed understanding. Nothing further needed.

## 2018-07-01 NOTE — Telephone Encounter (Signed)
I spoke with Danielle Bond's gastroenterologist, Juanita Craver, today.  I told her that I have not found any evidence of active infection causing her recent fevers.  I also told her that her p-ANCA was positive at a low level of 1:40.  I suspect that her fevers are due to her ulcerative colitis rather than infection.

## 2018-07-01 NOTE — Telephone Encounter (Signed)
GBM negative ANCA - trace positive for vasculitis. Did she do PR-3 and MPO antibodies to confirm? I do not see this resulted -> if not please have her do this

## 2018-07-01 NOTE — Telephone Encounter (Signed)
ANCA screen reflext might not have PR-3 and MPO in it. So please call lab . If PR-3 and MPO not done then she needs to come by

## 2018-07-02 ENCOUNTER — Other Ambulatory Visit: Payer: BLUE CROSS/BLUE SHIELD

## 2018-07-02 DIAGNOSIS — R768 Other specified abnormal immunological findings in serum: Secondary | ICD-10-CM

## 2018-07-03 LAB — MPO/PR-3 (ANCA) ANTIBODIES
Myeloperoxidase Abs: <1 AI
Serine Protease 3: <1 AI

## 2018-07-03 LAB — GLOMERULAR BASEMENT MEMBRANE ANTIBODIES: GBM Ab: <1 AI

## 2018-07-03 LAB — ANA W/REFLEX: ANA: NEGATIVE

## 2018-07-04 ENCOUNTER — Telehealth: Payer: Self-pay | Admitting: Internal Medicine

## 2018-07-04 NOTE — Telephone Encounter (Signed)
Let [name]  know full anca profile is negative. I have sent the message to dr Verdia Kuba of Gi - let her know

## 2018-07-04 NOTE — Telephone Encounter (Signed)
Called and spoke with pt letting her know the results of the labwork. Pt expressed understanding. Nothing further needed.

## 2018-07-07 DIAGNOSIS — E119 Type 2 diabetes mellitus without complications: Secondary | ICD-10-CM | POA: Diagnosis not present

## 2018-07-07 DIAGNOSIS — E890 Postprocedural hypothyroidism: Secondary | ICD-10-CM | POA: Diagnosis not present

## 2018-07-07 DIAGNOSIS — E039 Hypothyroidism, unspecified: Secondary | ICD-10-CM | POA: Diagnosis not present

## 2018-07-07 DIAGNOSIS — J4520 Mild intermittent asthma, uncomplicated: Secondary | ICD-10-CM | POA: Diagnosis not present

## 2018-07-07 DIAGNOSIS — Z0000 Encounter for general adult medical examination without abnormal findings: Secondary | ICD-10-CM | POA: Diagnosis not present

## 2018-07-07 DIAGNOSIS — E559 Vitamin D deficiency, unspecified: Secondary | ICD-10-CM | POA: Diagnosis not present

## 2018-07-08 ENCOUNTER — Ambulatory Visit (INDEPENDENT_AMBULATORY_CARE_PROVIDER_SITE_OTHER): Payer: BLUE CROSS/BLUE SHIELD | Admitting: Internal Medicine

## 2018-07-08 ENCOUNTER — Encounter: Payer: Self-pay | Admitting: Internal Medicine

## 2018-07-08 DIAGNOSIS — R509 Fever, unspecified: Secondary | ICD-10-CM | POA: Diagnosis not present

## 2018-07-08 DIAGNOSIS — N390 Urinary tract infection, site not specified: Secondary | ICD-10-CM | POA: Diagnosis not present

## 2018-07-08 DIAGNOSIS — R300 Dysuria: Secondary | ICD-10-CM | POA: Diagnosis not present

## 2018-07-08 NOTE — Progress Notes (Signed)
Danielle Bond for Infectious Disease  Patient Active Problem List   Diagnosis Date Noted  . Fever of unknown origin (FUO) 06/16/2018    Priority: High  . Dysuria 07/08/2018  . Graves disease 06/16/2018  . Hx of pancreatitis 06/16/2018  . Impingement syndrome of right shoulder 11/29/2016  . Pure hypercholesterolemia 12/22/2013  . Hemorrhage of rectum and anus 09/21/2012  . Autoimmune hepatitis ([place]) 08/16/2012  . History of anemia 03/31/2012  . Asthma 03/31/2012  . Osteopenia 03/31/2012  . Ulcerative colitis ([place]) 03/31/2012  . History of renal calculi 03/31/2012  . History of migraine 03/31/2012  . Mitral valve prolapse   . Orthostatic hypotension     Patient's Medications  New Prescriptions   No medications on file  Previous Medications   ACETAMINOPHEN (TYLENOL) 500 MG TABLET    Take 1,000 mg by mouth every 6 (six) hours as needed for mild pain.   ALBUTEROL SULFATE (PROAIR RESPICLICK) 071 (90 BASE) MCG/ACT AEPB    Inhale 1 puff into the lungs every 4 (four) hours as needed.   BUDESONIDE (ENTOCORT EC) 3 MG 24 HR CAPSULE    Take 9 mg by mouth daily.   CHOLECALCIFEROL 1.25 MG (50000 UT) TABS    Take 1 tablet by mouth daily.   CRESTOR 10 MG TABLET    Take 10 mg by mouth daily.    DICYCLOMINE (BENTYL) 20 MG TABLET    Take 0.5 tablets (10 mg total) by mouth 3 (three) times daily before meals.   FLUTICASONE FUROATE-VILANTEROL (BREO ELLIPTA) 100-25 MCG/INH AEPB    Inhale 1 puff into the lungs daily.   GUAIFENESIN-CODEINE 100-10 MG/5ML SYRUP    Take 10 mLs by mouth every 4 (four) hours as needed for cough.   LEVOTHYROXINE (SYNTHROID, LEVOTHROID) 112 MCG TABLET    Take 112 mcg by mouth daily before breakfast.   ONDANSETRON (ZOFRAN ODT) 4 MG DISINTEGRATING TABLET    Take 1 tablet (4 mg total) by mouth every 8 (eight) hours as needed.   ONDANSETRON (ZOFRAN) 8 MG TABLET    Take 8 mg by mouth 3 (three) times daily.  Modified Medications   No medications on file    Discontinued Medications   No medications on file    Subjective: Danielle Bond is in with her husband for her routine follow-up visit.  She saw her gastroenterologist, Dr. Collene Mares, last week.  She was started on budesonide 9 mg daily 6 days ago.  Within 24 hours she was feeling markedly better.  She is not recorded any fevers in the last week.  Her night sweats are slowly resolving.  She did not have any sweats last night.  Her diarrhea has resolved.  She is having about 3 "normal" bowel movements daily.  She still has a little bit of blood in her stools but this has decreased significantly.  Her abdominal pain has improved.  Still having some mild intermittent nausea but this has improved as well.  She and her husband feel like her appetite is improving.  Over the past few days she has noted some mild dysuria and urinary frequency.  She saw her endocrinologist, Dr. Debbora Presto, recently.  She was called this morning and told that she had some blood in her urine.  A urine culture is pending.  Review of Systems: Review of Systems  Constitutional: Negative for chills, diaphoresis, fever and weight loss.  Gastrointestinal: Positive for blood in stool and nausea. Negative for abdominal pain, diarrhea  and vomiting.  Genitourinary: Positive for dysuria, frequency and hematuria.    Past Medical History:  Diagnosis Date  . Asthma   . Chronic kidney disease   . Kidney stone   . Kidney stone   . Migraine   . Orthostatic hypotension   . Ulcerative colitis ([place]) 03/31/2012    Social History   Tobacco Use  . Smoking status: Never Smoker  . Smokeless tobacco: Never Used  Substance Use Topics  . Alcohol use: No  . Drug use: No    Family History  Problem Relation Age of Onset  . Cervical cancer Paternal Aunt   . Heart disease Paternal Grandmother   . Heart disease Mother   . Dementia Mother   . Thyroid disease Mother   . Osteoarthritis Mother   . Heart attack Father     Allergies  Allergen  Reactions  . Almond Oil Hives and Shortness Of Breath  . Other Anaphylaxis    Almonds, tree nuts  . Aspirin Hives and Nausea Only    Stomach cramps  . Hydromorphone Hcl Other (See Comments)    Caused Enzyme levels to go up  . Penicillins Hives and Swelling    Has patient had a PCN reaction causing immediate rash, facial/tongue/throat swelling, SOB or lightheadedness with hypotension: Yes Has patient had a PCN reaction causing severe rash involving mucus membranes or skin necrosis: No Has patient had a PCN reaction that required hospitalization: No Has patient had a PCN reaction occurring within the last 10 years: No  Stomach cramps If all of the above answers are "NO", then may proceed with Cephalosporin use.   . Acetaminophen Other (See Comments)    Has autoimmune hepatitis, wants to avoid APAP  . Corticosteroids Other (See Comments)    Had acute pancreatitis x 2 after steroid use.  . Imitrex [Sumatriptan] Swelling    Injection caused swelling  . Stadol [Butorphanol] Other (See Comments)    hallucinates  . Topamax [Topiramate] Other (See Comments)    Kidney stones  . Vancomycin Hives  . Azathioprine Rash  . Pepcid [Famotidine] Nausea And Vomiting    Makes acid worse  . Prednisone Nausea And Vomiting    All steroids causes nausea and pancreatitis     Objective: Vitals:   07/08/18 1056  BP: (!) 146/88  Pulse: 97  Temp: 98 F (36.7 C)  Weight: 137 lb (62.1 kg)  Height: 5' (1.524 m)   Body mass index is 26.76 kg/m.  Physical Exam Constitutional:      Comments: She is smiling and in much better spirits today.  Abdominal:     General: Abdomen is flat. There is no distension.     Palpations: Abdomen is soft.     Tenderness: There is no abdominal tenderness. There is no right CVA tenderness or left CVA tenderness.  Psychiatric:        Mood and Affect: Mood normal.      Problem List Items Addressed This Visit      High   Fever of unknown origin (FUO)    Believe  that her recent protracted fevers, chills and sweats were due to a flare of her ulcerative colitis.  They are resolving with steroid therapy.  I do not feel that any further work-up is indicated at this time.  She can follow-up here as needed.        Unprioritized   Dysuria    She may have a mild case of acute cystitis.  This can  probably be managed successfully with a short 3 to 5-day course of antibiotics based on culture results.  I will be available as needed.          Michel Bickers, MD [facility] [phone] pager   [phone] cell 07/08/2018, 11:34 AM

## 2018-07-08 NOTE — Assessment & Plan Note (Signed)
Believe that her recent protracted fevers, chills and sweats were due to a flare of her ulcerative colitis.  They are resolving with steroid therapy.  I do not feel that any further work-up is indicated at this time.  She can follow-up here as needed.

## 2018-07-08 NOTE — Assessment & Plan Note (Signed)
She may have a mild case of acute cystitis.  This can probably be managed successfully with a short 3 to 5-day course of antibiotics based on culture results.  I will be available as needed.

## 2018-07-11 DIAGNOSIS — Z0000 Encounter for general adult medical examination without abnormal findings: Secondary | ICD-10-CM | POA: Diagnosis not present

## 2018-07-22 DIAGNOSIS — H6121 Impacted cerumen, right ear: Secondary | ICD-10-CM | POA: Diagnosis not present

## 2018-07-24 ENCOUNTER — Telehealth: Payer: Self-pay | Admitting: Behavioral Health

## 2018-07-24 NOTE — Telephone Encounter (Signed)
If she would like a second opinion GI ablation I would suggest that she see 1 of the doctors in the [facility] practice.

## 2018-07-24 NOTE — Telephone Encounter (Signed)
Patient called stating she needs to see a GI doctor or nutritionist.  Patient states she values Dr. Hale Bogus opinion and would like for him to refer her to one in the [facility] system. Pricilla Riffle RN

## 2018-07-25 DIAGNOSIS — M79643 Pain in unspecified hand: Secondary | ICD-10-CM | POA: Diagnosis not present

## 2018-07-25 DIAGNOSIS — K5190 Ulcerative colitis, unspecified, without complications: Secondary | ICD-10-CM | POA: Diagnosis not present

## 2018-07-25 DIAGNOSIS — M7531 Calcific tendinitis of right shoulder: Secondary | ICD-10-CM | POA: Diagnosis not present

## 2018-07-25 DIAGNOSIS — M2550 Pain in unspecified joint: Secondary | ICD-10-CM | POA: Diagnosis not present

## 2018-07-29 ENCOUNTER — Ambulatory Visit (INDEPENDENT_AMBULATORY_CARE_PROVIDER_SITE_OTHER): Payer: BLUE CROSS/BLUE SHIELD | Admitting: Internal Medicine

## 2018-07-29 ENCOUNTER — Encounter: Payer: Self-pay | Admitting: Internal Medicine

## 2018-07-29 VITALS — BP 122/82 | HR 105 | Ht 60.0 in | Wt 149.6 lb

## 2018-07-29 DIAGNOSIS — Z9189 Other specified personal risk factors, not elsewhere classified: Secondary | ICD-10-CM

## 2018-07-29 DIAGNOSIS — R053 Chronic cough: Secondary | ICD-10-CM

## 2018-07-29 DIAGNOSIS — R509 Fever, unspecified: Secondary | ICD-10-CM | POA: Diagnosis not present

## 2018-07-29 DIAGNOSIS — R05 Cough: Secondary | ICD-10-CM | POA: Diagnosis not present

## 2018-07-29 NOTE — Progress Notes (Signed)
Patient ID: Danielle Bond, female    DOB: 1961-06-22, 58 y.o.   MRN: [id number]  HPI   PCP Jani Gravel, MD  Neurologist-Dr. Krista Blue Infectious diseases Dr. Michel Bickers Orthopedic for right shoulder-Dr. Hildred Priest 06/18/2018  Chief Complaint  Patient presents with  . Pulmonary Consult    asthma, [name], NP referred her, she went to the ER and she had bronchitis with fever, she has constant fever and she has seen Infectious DIsease for this, she has a dry hacking cough    58 year old female with a chronic complex set of medical problems that includes hyperlipidemia, Graves' disease, status post thyroidectomy, ulcerative colitis, kidney stone and history of acute pancreatitis.  She also has long history of migraine headaches.  She presents with her husband for respiratory issues in the context of fever of unknown origin and bloody stools  She tells me that she was diagnosed with ulcerative colitis in [place] in 2007 when the used to live [place].  In 2013 she migrated to [place], [place] and had a bad flareup of ulcerative colitis for which she was admitted.  Since then she has been under the care of Dr. Collene Mares gastroenterologist and she has been on Lialda.  Overall she is doing really wel0 .then some few years ago she was started on Brio for diagnosis of asthma.  Details of this is unclear.  Overall doing well since then till September 2019 when she felt the Lialda stopped working on a subjective basis.  This is because since then every time she takes the Lialda she has abdominal pain.  She also has fever at night on a daily basis.  She also has bloody stools at least 7 times a day.  She has abdominal pain when she takes the Edna.  Per her report Dr. Collene Mares has done a colonoscopy with biopsy that showed inflammation.  Therefore biologic is being considered.  However she is also increasing fatigue and shortness of breath associated with it.  Is mostly present on exertion  relieved by rest.  Is also wheezing.  Overall she has had a significant deterioration.  Then towards the end of October 2019 she felt she had an asthma flareup and reported to the urgent care center where she was treated acutely.  But she represented within a week or 10 days to the ER and was given a diagnosis of acute bronchitis.  CT angiogram ruled out pulmonary embolism.  All these records have been reviewed by me and the images and results visualized.  She subsequently has seen Dr. Michel Bickers infectious diseases this week.  I reviewed his findings.  He is worried that the Lialda is causing eosinophilia.  He does not suspect infectious disease as a reason for the fever quantified on gold and HIV are negative.  Further blood results are pending.  Patient says she has had an extensive work-up.  She is also seen [facility] rheumatology department Dr. Joellyn Rued who apparently has done an extensive autoimmune panel and this is all negative.  Unclear is if vasculitis work-up has been done that includes ANCA and GBM.  Patient is really frustrated by the whole constellation of symptoms.  At this point in time a biologic agent is being considered for ulcerative colitis but apparently the fever is being considered is unusual in the setting of ulcerative colitis and therefore she is going through the work-up  Exam nitric oxide is 21 ppb and this is on [employer]  today and is normal.  Recent labs June 12, 2018 done at [facility] rheumatology: Sjogren antibody centromere antibody, Jo 1 antibody, chromatin antibody, SSA, SSB, anti-scleroderma antibody, Smith antibody, double-stranded DNA antibody and RNP antibody and ESR all normal.  TSH is normal.  Hemoglobin is 12 g%.  Your sniffles continue to be high at 13.6% of her total white count of 7400.  Creatinine is normal.  Urine analysis appears normal.  Results for LANNY, DONOSO (MRN [id number]) as of 06/18/2018 09:18  Ref. Range  11/27/2013 12:40 03/10/2014 18:52 03/16/2014 16:42 05/23/2018 05:31 06/16/2018 15:10  Eosinophils Absolute Latest Ref Range: 15 - 500 cells/uL 0.2 0.4 0.2 0.6 (H) 385   Results for AMANDEEP, HOGSTON (MRN [id number]) as of 06/18/2018 09:18  Ref. Range 10/05/2013 14:36 03/16/2014 16:42  Anti Nuclear Antibody(ANA) Latest Ref Range: NEGATIVE  POS (A)   ANA Pattern 1 Unknown HOMOGENOUS (A)   ANA Titer 1 Latest Ref Range: <1:40   1:1280 (H)   ds DNA Ab Latest Units: IU/mL  1  C3 Complement Latest Ref Range: 90 - 180 mg/dL  116  C4 Complement Latest Ref Range: 10 - 40 mg/dL  18   Most recent CT scan of the chest angiogram May 23, 2018: Personally visualized.  Lung fields look fairly benign.  OV 07/29/2018  Subjective:  Patient ID: Danielle Bond, female , DOB: [birth year] , age 76 y.o. , MRN: [id number] , ADDRESS: [address] [place] [id number]   07/29/2018 -   Chief Complaint  Patient presents with  . Follow-up    Pt states she is doing better since last visit. States she still has an occ dry cough but nothing like last visit. Pt states she does have some SOB/chest tightness today due to the weather.     HPI Danielle Bond 58 y.o. -presents with her husband for chronic cough and fever of unknown origin in the setting of ulcerative colitis and Lialda treatment  After her last visit we reviewed autoimmune panel from primary rheumatologist and this was negative.  We performed vasculitis panel and this was negative.  In the interim she is seen infectious disease specialist Dr. Megan Salon and apparently no infection present for her fever of unknown origin.  Discussions were held with Dr. Collene Mares.  She also discussed with a gastroenterologist at [facility] and the thought process was that maybe the fevers are indeed coming from ulcerative colitis although this is very rare.  This is because autoimmune, vasculitis and infectious work-up was negative.  On July 01, 2018 patient's Lialda was  stopped and she has been prescribed budesonide oral treatment.  Subsequently after this within a few weeks her cough is significantly improved.  She not having any more night sweats other than one at night.  This is 80% better.  And she is also feeling a lot better.  Apparently starting Entyvio biologic for ulcerative colitis is in discussion but she is somewhat got trepidation about this given the fact it is an infusion.  She will meet with Dr. Collene Mares about this.  Overall at this point she is feeling well no respiratory complaints other than the mild cough.  She is asking for referral to primary care physician.  Her current primary care physician is apparently out of the office for an extended period of time not otherwise specified.     ROS - per HPI     has a past medical history of Asthma, Chronic kidney disease, Kidney stone, Kidney stone, Migraine,  Orthostatic hypotension, and Ulcerative colitis ([place]) (03/31/2012).   reports that she has never smoked. She has never used smokeless tobacco.  Past Surgical History:  Procedure Laterality Date  . CESAREAN SECTION    . CHOLECYSTECTOMY    . COLONOSCOPY    . LITHOTRIPSY    . THYROIDECTOMY      Allergies  Allergen Reactions  . Almond Oil Hives and Shortness Of Breath  . Other Anaphylaxis    Almonds, tree nuts  . Aspirin Hives and Nausea Only    Stomach cramps  . Hydromorphone Hcl Other (See Comments)    Caused Enzyme levels to go up  . Penicillins Hives and Swelling    Has patient had a PCN reaction causing immediate rash, facial/tongue/throat swelling, SOB or lightheadedness with hypotension: Yes Has patient had a PCN reaction causing severe rash involving mucus membranes or skin necrosis: No Has patient had a PCN reaction that required hospitalization: No Has patient had a PCN reaction occurring within the last 10 years: No  Stomach cramps If all of the above answers are "NO", then may proceed with Cephalosporin use.   .  Acetaminophen Other (See Comments)    Has autoimmune hepatitis, wants to avoid APAP  . Corticosteroids Other (See Comments)    Had acute pancreatitis x 2 after steroid use.  . Imitrex [Sumatriptan] Swelling    Injection caused swelling  . Stadol [Butorphanol] Other (See Comments)    hallucinates  . Topamax [Topiramate] Other (See Comments)    Kidney stones  . Vancomycin Hives  . Azathioprine Rash  . Pepcid [Famotidine] Nausea And Vomiting    Makes acid worse  . Prednisone Nausea And Vomiting    All steroids causes nausea and pancreatitis     Immunization History  Administered Date(s) Administered  . Influenza Inj Mdck Quad Pf 03/23/2018  . Influenza Split 04/10/2012, 04/24/2013  . Influenza, Seasonal, Injecte, Preservative Fre 04/15/2014  . PPD Test 10/05/2013  . Pneumococcal Polysaccharide-23 04/22/2008, 04/23/2013  . Tdap 07/23/2004, 07/22/2012  . Varicella 07/22/2012    Family History  Problem Relation Age of Onset  . Cervical cancer Paternal Aunt   . Heart disease Paternal Grandmother   . Heart disease Mother   . Dementia Mother   . Thyroid disease Mother   . Osteoarthritis Mother   . Heart attack Father      Current Outpatient Medications:  .  acetaminophen (TYLENOL) 500 MG tablet, Take 1,000 mg by mouth every 6 (six) hours as needed for mild pain., Disp: , Rfl:  .  Albuterol Sulfate (PROAIR RESPICLICK) 540 (90 Base) MCG/ACT AEPB, Inhale 1 puff into the lungs every 4 (four) hours as needed., Disp: 1 each, Rfl: 0 .  budesonide (ENTOCORT EC) 3 MG 24 hr capsule, Take 9 mg by mouth daily., Disp: , Rfl:  .  CRESTOR 10 MG tablet, Take 10 mg by mouth daily. , Disp: , Rfl: 1 .  fluticasone furoate-vilanterol (BREO ELLIPTA) 100-25 MCG/INH AEPB, Inhale 1 puff into the lungs daily., Disp: , Rfl:  .  levothyroxine (SYNTHROID, LEVOTHROID) 112 MCG tablet, Take 112 mcg by mouth daily before breakfast., Disp: , Rfl:  .  ondansetron (ZOFRAN) 8 MG tablet, Take 8 mg by mouth 3  (three) times daily., Disp: , Rfl: 2      Objective:   Vitals:   07/29/18 1115  BP: 122/82  Pulse: (!) 105  SpO2: 93%  Weight: 149 lb 9.6 oz (67.9 kg)  Height: 5' (1.524 m)    Estimated  body mass index is 29.22 kg/m as calculated from the following:   Height as of this encounter: 5' (1.524 m).   Weight as of this encounter: 149 lb 9.6 oz (67.9 kg).  _0 @  [employer]   07/29/18 1115  Weight: 149 lb 9.6 oz (67.9 kg)     Physical Exam  General Appearance:    Alert, cooperative, no distress, appears stated age - yes , Deconditioned looking - no , OBESE  - no, Sitting on Wheelchair -  no  Head:    Normocephalic, without obvious abnormality, atraumatic  Eyes:    PERRL, conjunctiva/corneas clear,  Ears:    Normal TM's and external ear canals, both ears  Nose:   Nares normal, septum midline, mucosa normal, no drainage    or sinus tenderness. OXYGEN ON  - no . Patient is @ ra   Throat:   Lips, mucosa, and tongue normal; teeth and gums normal. Cyanosis on lips - no  Neck:   Supple, symmetrical, trachea midline, no adenopathy;    thyroid:  no enlargement/tenderness/nodules; no carotid   bruit or JVD  Back:     Symmetric, no curvature, ROM normal, no CVA tenderness  Lungs:     Distress - no , Wheeze no, Barrell Chest - no, Purse lip breathing - no, Crackles - no   Chest Wall:    No tenderness or deformity.    Heart:    Regular rate and rhythm, S1 and S2 normal, no rub   or gallop, Murmur - no  Breast Exam:    NOT DONE  Abdomen:     Soft, non-tender, bowel sounds active all four quadrants,    no masses, no organomegaly. Visceral obesity - no  Genitalia:   NOT DONE  Rectal:   NOT DONE  Extremities:   Extremities - normal, Has Cane - no, Clubbing - no, Edema - no  Pulses:   2+ and symmetric all extremities  Skin:   Stigmata of Connective Tissue Disease - no  Lymph nodes:   Cervical, supraclavicular, and axillary nodes normal  Psychiatric:  Neurologic:   Pleasant -  yes, Anxious - no, Flat affect - no  CAm-ICU - neg, Alert and Oriented x 3 - yes, Moves all 4s - yes, Speech - normal, Cognition - intact           Assessment:       ICD-10-CM   1. Chronic cough R05   2. FUO (fever of unknown origin) R50.9   3. Has difficulty accessing primary care provider for some visits Z91.89        Plan:     Patient Instructions   primary care physician  - refer to [facility] due to difficulty accessing current PCP  - Drs Paulla Fore, Wyatt Mage, or Dimas Chyle  Chronic cough  - glad nearly resolved  FUO (fever of unknown origin) - glad nearly resolved - address colitis treatment with Dr Collene Mares   Followup 6 months or sooner if needed   (> 50% of this 15 min visit spent in face to face counseling or/and coordination of care by this undersigned MD - Dr Brand Males. This includes one or more of the following documented above: discussion of test results, diagnostic or treatment recommendations, prognosis, risks and benefits of management options, instructions, education, compliance or risk-factor reduction)   SIGNATURE    Dr. Brand Males, M.D., F.C.C.P,  Pulmonary and Critical Care Medicine Staff Physician, [facility]  Director - Interstitial Lung Disease  Program  Pulmonary [place] at [place], [place], [address]  Pager: [phone], If no answer or between  15:00h - 7:00h: call [phone] Telephone: [phone]  11:45 AM 07/29/2018

## 2018-07-29 NOTE — Addendum Note (Signed)
Addended by: Lorretta Harp on: 07/29/2018 11:51 AM   Modules accepted: Orders

## 2018-07-29 NOTE — Patient Instructions (Addendum)
primary care physician  - refer to [place] due to difficulty accessing current PCP  - Drs Paulla Fore, Wyatt Mage, or Dimas Chyle  Chronic cough  - glad nearly resolved  FUO (fever of unknown origin) - glad nearly resolved - address colitis treatment with Dr Collene Mares   Followup 6 months or sooner if needed

## 2018-07-30 ENCOUNTER — Other Ambulatory Visit: Payer: Self-pay | Admitting: Behavioral Health

## 2018-07-30 DIAGNOSIS — K51011 Ulcerative (chronic) pancolitis with rectal bleeding: Secondary | ICD-10-CM

## 2018-07-30 DIAGNOSIS — Z8719 Personal history of other diseases of the digestive system: Secondary | ICD-10-CM

## 2018-07-30 NOTE — Addendum Note (Signed)
Addended by: Alberteen Sam on: 07/30/2018 05:05 PM   Modules accepted: Orders

## 2018-07-30 NOTE — Addendum Note (Signed)
Addended by: Alberteen Sam on: 07/30/2018 05:01 PM   Modules accepted: Orders

## 2018-08-05 DIAGNOSIS — R110 Nausea: Secondary | ICD-10-CM | POA: Diagnosis not present

## 2018-08-05 DIAGNOSIS — K5904 Chronic idiopathic constipation: Secondary | ICD-10-CM | POA: Diagnosis not present

## 2018-08-05 DIAGNOSIS — K219 Gastro-esophageal reflux disease without esophagitis: Secondary | ICD-10-CM | POA: Diagnosis not present

## 2018-08-05 DIAGNOSIS — K5100 Ulcerative (chronic) pancolitis without complications: Secondary | ICD-10-CM | POA: Diagnosis not present

## 2018-08-15 ENCOUNTER — Encounter: Payer: Self-pay | Admitting: Family Medicine

## 2018-08-15 ENCOUNTER — Ambulatory Visit (INDEPENDENT_AMBULATORY_CARE_PROVIDER_SITE_OTHER): Payer: BLUE CROSS/BLUE SHIELD | Admitting: Family Medicine

## 2018-08-15 VITALS — BP 124/78 | HR 98 | Temp 98.5°F | Ht 60.0 in | Wt 150.4 lb

## 2018-08-15 DIAGNOSIS — E039 Hypothyroidism, unspecified: Secondary | ICD-10-CM | POA: Insufficient documentation

## 2018-08-15 DIAGNOSIS — J45909 Unspecified asthma, uncomplicated: Secondary | ICD-10-CM

## 2018-08-15 DIAGNOSIS — K754 Autoimmune hepatitis: Secondary | ICD-10-CM

## 2018-08-15 DIAGNOSIS — K51011 Ulcerative (chronic) pancolitis with rectal bleeding: Secondary | ICD-10-CM | POA: Diagnosis not present

## 2018-08-15 DIAGNOSIS — E785 Hyperlipidemia, unspecified: Secondary | ICD-10-CM | POA: Insufficient documentation

## 2018-08-15 NOTE — Assessment & Plan Note (Signed)
Stable.  Continue management per gastroenterology.

## 2018-08-15 NOTE — Patient Instructions (Signed)
It was very nice to see you today!  I am glad that you are doing better!  No changes today. Keep up the good work!  Come back to see me in December for your physical with blood work, or sooner as needed.   Take care, Dr [name]

## 2018-08-15 NOTE — Progress Notes (Signed)
Chief Complaint:  Marieli Rudy is a 58 y.o. female who presents today with a chief complaint of dyslipidemia and to establish care.   Assessment/Plan:  Dyslipidemia Continue Crestor 10 mg daily.  Will obtain records from previous PCP.  She will follow-up with me later this year for CPE with blood work.  Asthma Stable.  Continue management per pulmonology.  Ulcerative colitis Stable.  Continue management per gastroenterology.  Hypothyroidism s/p total thyroidectomy 1997 Stable.  Continue management per endocrinology.  Autoimmune hepatitis Stable.  Continue management per gastroenterology.  Preventive health care Obtain records from previous PCP.  She will follow-up with me later this year for CPE with blood work.  She gets her mammogram and Pap smear done via gynecology with Dr. Cletis Media.  Subjective   Subjective:  HPI:  Patient recently underwent extensive work-up for fever of unknown origin.  She saw infectious disease and it was determined that her Doristine Johns was the most likely etiology.  Since stopped this medication and her symptoms have significantly improved.  Overall her medical conditions are stable and she has no pressing concerns or acute complaints today.  She has several chronic, stable conditions outlined below:  # Dyslipidemia - Currently on crestor 14m daily and tolerating well - No myalgias  # Hyp[place] [place] B[place]mers Cater- Currently on synthroid 11100m daily and tolerating well  # Ulcerative Colitis/autoimmune hepatitis - Follows with gastroenterology - Dr MaCollene Mares Currently on budesonide and doing well.  # Asthma - Follows with pulmonology - Dr RaChase CallerCurrently [facility].  Tolerating these well without side effects.  ROS: Per HPI, otherwise a complete review of systems was negative.   PMH:  The following were reviewed and entered/updated in epic: Past  Medical History:  Diagnosis Date  . Asthma   . Chronic kidney disease   . Hx of pancreatitis 06/16/2018  . Kidney stone   . Kidney stone   . Migraine   . Orthostatic hypotension   . Osteopenia 03/31/2012  . [place]ative [place] ([place]   Patient Active Problem List   Diagnosis Date Noted  . Dyslipidemia 08/15/2018  . Hypothyroidism s/p total thyroidectomy 1997 08/15/2018  . Autoimmune hepatitis ([place]/25/2014  . Asthma 03/31/2012  . Ulcerative colitis ([place]/03/2012  . History of renal calculi 03/31/2012  . History of migraine 03/31/2012  . Mitral valve prolapse    Past Surgical History:  Procedure Laterality Date  . CESAREAN SECTION     x3  . CHOLECYSTECTOMY    . COLONOSCOPY    . LITHOTRIPSY    . THYROIDECTOMY     1997    Family History  Problem Relation Age of Onset  . Cervical cancer Paternal Aunt   . Heart disease Paternal Grandmother   . Heart disease Mother   . Dementia Mother   . Thyroid disease Mother   . Osteoarthritis Mother   . Asthma Mother   . Heart attack Father   . Breast cancer Cousin     Medications- reviewed and updated Current Outpatient Medications  Medication Sig Dispense Refill  . acetaminophen (TYLENOL) 500 MG tablet Take 1,000 mg by mouth every 6 (six) hours as needed for mild pain.    . Albuterol Sulfate (PROAIR RESPICLICK) 1089390 Base) MCG/ACT AEPB Inhale 1 puff into the lungs every 4 (four) hours as needed. 1 each 0  . budesonide (ENTOCORT EC) 3 MG 24 hr capsule Take 9  mg by mouth daily.    . CRESTOR 10 MG tablet Take 10 mg by mouth daily.   1  . fluticasone furoate-vilanterol (BREO ELLIPTA) 100-25 MCG/INH AEPB Inhale 1 puff into the lungs daily.    Marland Kitchen levothyroxine (SYNTHROID, LEVOTHROID) 112 MCG tablet Take 112 mcg by mouth daily before breakfast.    . ondansetron (ZOFRAN) 8 MG tablet Take 8 mg by mouth 3 (three) times daily.  2   No current facility-administered medications for this visit.     Allergies-reviewed and  updated Allergies  Allergen Reactions  . Almond Oil Hives and Shortness Of Breath  . Other Anaphylaxis    Almonds, tree nuts  . Aspirin Hives and Nausea Only    Stomach cramps  . Hydromorphone Hcl Other (See Comments)    Caused Enzyme levels to go up  . Penicillins Hives and Swelling    Has patient had a PCN reaction causing immediate rash, facial/tongue/throat swelling, SOB or lightheadedness with hypotension: Yes Has patient had a PCN reaction causing severe rash involving mucus membranes or skin necrosis: No Has patient had a PCN reaction that required hospitalization: No Has patient had a PCN reaction occurring within the last 10 years: No  Stomach cramps If all of the above answers are "NO", then may proceed with Cephalosporin use.   . Acetaminophen Other (See Comments)    Has autoimmune hepatitis, wants to avoid APAP  . Corticosteroids Other (See Comments)    Had acute pancreatitis x 2 after steroid use.  . Imitrex [Sumatriptan] Swelling    Injection caused swelling  . Stadol [Butorphanol] Other (See Comments)    hallucinates  . Topamax [Topiramate] Other (See Comments)    Kidney stones  . Vancomycin Hives  . Azathioprine Rash  . Pepcid [Famotidine] Nausea And Vomiting    Makes acid worse  . Prednisone Nausea And Vomiting    All steroids causes nausea and pancreatitis     Social History   Socioeconomic History  . Marital status: Married    Spouse name: Not on file  . Number of children: 2  . Years of education: some college  . Highest education level: Not on file  Occupational History  . Occupation: Self-employed  Social Needs  . Financial resource strain: Not on file  . Food insecurity:    Worry: Not on file    Inability: Not on file  . Transportation needs:    Medical: Not on file    Non-medical: Not on file  Tobacco Use  . Smoking status: Never Smoker  . Smokeless tobacco: Never Used  Substance and Sexual Activity  . Alcohol use: No  . Drug use: No   . Sexual activity: Yes  Lifestyle  . Physical activity:    Days per week: Not on file    Minutes per session: Not on file  . Stress: Not on file  Relationships  . Social connections:    Talks on phone: Not on file    Gets together: Not on file    Attends religious service: Not on file    Active member of club or organization: Not on file    Attends meetings of clubs or organizations: Not on file    Relationship status: Not on file  Other Topics Concern  . Not on file  Social History Narrative   Lives at home with her husband.   Right-handed.   No caffeine use.      Objective   Objective:  Physical Exam: BP 124/78 (  BP Location: Left Arm, Patient Position: Sitting, Cuff Size: Normal)   Pulse 98   Temp 98.5 F (36.9 C) (Oral)   Ht 5' (1.524 m)   Wt 150 lb 6.4 oz (68.2 kg)   LMP 03/03/2013   SpO2 98%   BMI 29.37 kg/m   Gen: NAD, resting comfortably CV: Regular rate and rhythm with no murmurs appreciated Pulm: Normal work of breathing, clear to auscultation bilaterally with no crackles, wheezes, or rhonchi GI: Normal bowel sounds present. Soft, Nontender, Nondistended. MSK: No edema, cyanosis, or clubbing noted Skin: Warm, dry Neuro: Grossly normal, moves all extremities Psych: Normal affect and thought content     Susano Cleckler M. [name], MD 08/15/2018 5:17 PM

## 2018-08-15 NOTE — Assessment & Plan Note (Signed)
Stable.  Continue management per pulmonology.

## 2018-08-15 NOTE — Assessment & Plan Note (Signed)
Continue Crestor 10 mg daily.  Will obtain records from previous PCP.  She will follow-up with me later this year for CPE with blood work.

## 2018-08-15 NOTE — Assessment & Plan Note (Signed)
Stable.  Continue management per endocrinology.

## 2018-08-20 ENCOUNTER — Encounter: Payer: Self-pay | Admitting: Family Medicine

## 2018-08-21 ENCOUNTER — Telehealth: Payer: Self-pay | Admitting: *Deleted

## 2018-08-21 ENCOUNTER — Encounter: Payer: Self-pay | Admitting: Dietician

## 2018-08-21 ENCOUNTER — Encounter: Payer: BLUE CROSS/BLUE SHIELD | Attending: Internal Medicine | Admitting: Dietician

## 2018-08-21 DIAGNOSIS — K51011 Ulcerative (chronic) pancolitis with rectal bleeding: Secondary | ICD-10-CM | POA: Diagnosis not present

## 2018-08-21 NOTE — Progress Notes (Signed)
Medical Nutrition Therapy:  Appt start time: 3500 end time:  1050.   Assessment:  Primary concerns today: energy, resolution of ulcerative colitis and improvement in her overall health and immune system.   Patient is here alone.  In August, she began bleeding rectally.  ("explosions of blood, blood clots, white stool").   Lost 15 lbs in 3 weeks.   Went to the ER and she has an allergy.  She states that a medication (Lialda) was making the bleeding worse.  She has felt better since stopping this medication. Colonoscopy showed inflammation in her colon.  She states she is now on Aprezo and feeling much better. Sleep is poor.  Goes to bed at 11 pm and gets about 5 hours of restless sleep.  She wakes up exhausted.   She was also having fever of unknown cause and severe night sweats. History includes ulcerative colitis, steroid induced pancreatitis, autoimmune hepatitis, and graves disease.  She states that she flares every 5 years (dx 2007, 201403/26/2019).   She has had an IgG allergy test but mostly is just keeping a food and symptom journal.  She does not tolerate gluten but is not pure GF.  She does avoid dairy (nausea and heartburn), avoids eggs (heartburn).  Does not tolerate brown rice. Avoids high fructose corn syrup.  Labs include CRP 85.3 (06/26/18)  Patient feels that she is having increased inflammation and pain today. Weight today 148 lbs 170 lbs highest weight 06/2017.  Patient lives with her husband and daughter.  She enjoys researching and reading and likes to meditate.  She has her own business (natural skin care products) and states that she does not have stress.  She is trained as a Engineering geologist.  Her oldest daughter has crohn's.  She used to be a medical assistant/phlebotomist. Her mother died in October 16, 2015 and her father had a severe MI then CABG in October 16, 2015.  She stayed with him for 3 months out of state.  Preferred Learning Style:   No preference indicated   Learning Readiness:    Ready  Change in progress   MEDICATIONS: see list to include folate, MVI, hair/skin/nails, vitamin D, took magnesium in the past   DIETARY INTAKE:  24-hr recall:  B ( AM): shake (Primal kitchen, flax seed milk, mango or berries, occasional banana but some heartburn from this) OR GF toast, jelly, flax seed milk  Snk ( AM): none  L ( PM): sandwich on GF toast (vegan cheese, vegan/GF ham, vegan mayo) and occasional fruit Snk ( PM): berries D ( PM): steak or chicken, broccoli, vegetables, white rice, sometimes beans, salad OR salad and meat, italian dressing Snk ( PM): flax seed milk or vegan/soyfree ice cream Beverages: water, unsweetened tea, shake as above  Usual physical activity: gym membership- 3 times per week for 30-40 minutes (weight machines and rowing machine), powder walking outside 30-40 minutes  Progress Towards Goal(s):  In progress.   Nutritional Diagnosis:  NB-1.1 Food and nutrition-related knowledge deficit As related to nutrition and ulcerative colitis.  As evidenced by patient report.    Intervention:  Nutrition counseling/education related to ulcerative colitis and immune issues.  Discussed patient's allergies, food/symptom journal and food that she tolerates and does not tolerate.  Discussed consider resuming magnesium if this helps her sleep better and does not increase symptoms.  Discussed fiber, when to avoid and when to include.  Discussed plant based whole foods eating as tolerated to see if this would improve her symptoms.  She states  that she has friends that follow this way of eating.  Discussed stress and triggers.  Continue to keep a food and symptom journal. Avoid high fiber foods when your UC is flaring. Continue to avoid the foods that you do not tolerate. Continue less processed foods with increased nutritional value.  Teaching Method Utilized:  Visual Auditory  Handouts given during visit include:  Inflammatory bowel disease nutrition therapy  from AND  Plant based primer (basics of plant based eating and resources)  Barriers to learning/adherence to lifestyle change: none  Demonstrated degree of understanding via:  Teach Back   Monitoring/Evaluation:  Dietary intake, exercise, and body weight prn.

## 2018-08-21 NOTE — Patient Instructions (Signed)
Continue to keep a food and symptom journal. Avoid high fiber foods when your UC is flaring. Continue to avoid the foods that you do not tolerate. Continue less processed foods with increased nutritional value.

## 2018-08-21 NOTE — Telephone Encounter (Signed)
Patient walked into clinic, asked that her most recent labs from Dr Hale Bogus visit be faxed to her Rheumatologist on file, Dr Kathlene November.  Margart Sickles. Landis Gandy, RN

## 2018-08-26 ENCOUNTER — Encounter: Payer: Self-pay | Admitting: Family Medicine

## 2018-08-26 ENCOUNTER — Ambulatory Visit (INDEPENDENT_AMBULATORY_CARE_PROVIDER_SITE_OTHER): Payer: BLUE CROSS/BLUE SHIELD | Admitting: Family Medicine

## 2018-08-26 VITALS — BP 118/74 | HR 81 | Temp 98.1°F | Ht 60.0 in | Wt 151.0 lb

## 2018-08-26 DIAGNOSIS — R21 Rash and other nonspecific skin eruption: Secondary | ICD-10-CM

## 2018-08-26 DIAGNOSIS — H9201 Otalgia, right ear: Secondary | ICD-10-CM

## 2018-08-26 MED ORDER — VALACYCLOVIR HCL 1 G PO TABS: 1000.0000 mg | ORAL_TABLET | Freq: Three times a day (TID) | ORAL | 0 refills | Status: DC

## 2018-08-26 MED ORDER — CIPROFLOXACIN-DEXAMETHASONE 0.3-0.1 % OT SUSP: 4.0000 [drp] | Freq: Two times a day (BID) | OTIC | 0 refills | Status: DC

## 2018-08-26 NOTE — Patient Instructions (Signed)
It was very nice to see you today!  Please start the drops in the Valtrex.  We will check a swab today to make sure this is not shingles.  Please keep close follow-up with your ENT later this week.  Let us know if your symptoms worsen or do not improve the next few days.  Take care, Dr [name]

## 2018-08-26 NOTE — Progress Notes (Signed)
   Chief Complaint:  Danielle Bond is a 58 y.o. female who presents for same day appointment with a chief complaint of ear Bond.   Assessment/Plan:  Ear Bond / Drainage Concern for possible zoster outbreak given neuropathic-like description of Bond in addition to history of vesicular lesions with clear drainage.  We will empirically start Valtrex 1000 mg 3 times daily x7 days.  Check zoster PCR.  Also start Ciprodex drops to prevent bacterial superinfection.  Discussed reasons to return to care.  She will follow-up with ENT in 2 days.     Subjective:  HPI:  Ear Drainage, acute problem Started last night.  Was in her normal state of health when she is lying in bed last night and felt a sharp, painful sensation to her right ear.  She scratched area and noticed significant clear drainage.  Since then has noticed worsening Bond.  Bond is described as a burning sensation.  Also has  pins-and-needles to the area as well.  Bond kept her up all night.  She has not tried anything for this.  She has had a bit of muffled hearing to the area.  No weakness or numbness.  No vision changes.  No fevers or chills.  She did have a few "pimples" to the area that she scratched open with clear drainage.  She has never had shingles outbreak in the past.  No other obvious alleviating or aggravating factors.   ROS: Per HPI  PMH: She reports that she has never smoked. She has never used smokeless tobacco. She reports that she does not drink alcohol or use drugs.      Objective:  Physical Exam: BP 118/74 (BP Location: Left Arm, Patient Position: Sitting, Cuff Size: Normal)   Pulse 81   Temp 98.1 F (36.7 C) (Oral)   Ht 5' (1.524 m)   Wt 151 lb (68.5 kg)   LMP 03/03/2013   SpO2 98%   BMI 29.49 kg/m   Gen: NAD, resting comfortably HEENT: Left pinna and EAC clear.  Right pinna grossly erythematous and edematous.  Right EAC with clear drainage and significant amount of white debris. Neuro: CN2-12 intact.  No  facial droop.     Danielle Bond. Danielle Pain, MD 08/26/2018 3:47 PM

## 2018-08-27 DIAGNOSIS — R61 Generalized hyperhidrosis: Secondary | ICD-10-CM | POA: Diagnosis not present

## 2018-08-27 DIAGNOSIS — T50905A Adverse effect of unspecified drugs, medicaments and biological substances, initial encounter: Secondary | ICD-10-CM | POA: Diagnosis not present

## 2018-08-28 ENCOUNTER — Telehealth: Payer: Self-pay | Admitting: Internal Medicine

## 2018-08-28 DIAGNOSIS — H60331 Swimmer's ear, right ear: Secondary | ICD-10-CM | POA: Diagnosis not present

## 2018-08-28 DIAGNOSIS — Z8719 Personal history of other diseases of the digestive system: Secondary | ICD-10-CM

## 2018-08-28 LAB — VARICELLA-ZOSTER BY PCR: VARICELLA ZOSTER VIRUS (VZV) DNA, QL RT PCR: NOT DETECTED

## 2018-08-28 NOTE — Telephone Encounter (Signed)
Spoke with the pt  She wants to look up these doctors online and then call us when she chooses which one she wants to see

## 2018-08-28 NOTE — Telephone Encounter (Signed)
Spoke with the pt  She states wanting second opinion for GI   She wants referral to [facility]  She wants to know who we could recommend and if we can do the referral  Please advise, thanks

## 2018-08-28 NOTE — Telephone Encounter (Signed)
Dr Havery Moros, or N[place] or Pyrtle -

## 2018-08-28 NOTE — Telephone Encounter (Signed)
Called and spoke to pt. Pt states that she would like referral placed to Dr. Raquel James.  Referral has been placed based on earlier telephone encounter. Nothing further is needed.

## 2018-08-29 ENCOUNTER — Telehealth: Payer: Self-pay | Admitting: Dietician

## 2018-08-29 NOTE — Telephone Encounter (Signed)
Returned patient call. Patient stated that she had an allergic reaction to the last GI medication that she tried.   She stated that she had listened to a video from a Plant Based MD (gastroenterologist) and is planning on going plant based Monday.  She states that she has been doing research. She would like a plant based MD.  Discussed that she can find a plant based doctor at [email or web address].   Discussed to begin slowly with fiber as her digestive track may have problems with increased amounts of fiber at this time.  She may better tolerate vegetables in smoothies or cooked.   Patient to call for updates and questions.  Antonieta Iba, RD, LDN, CDE

## 2018-09-01 NOTE — Progress Notes (Signed)
Please inform patient of the following:  Her swab for shingles was NEGATIVE. It is safe for her to stop the valtrex.  [name]. [name], MD 09/01/2018 9:46 AM

## 2018-09-11 ENCOUNTER — Telehealth: Payer: Self-pay | Admitting: Internal Medicine

## 2018-09-11 NOTE — Telephone Encounter (Signed)
Let patient Danielle Bond k now that we can look at [place] [facility] [place] or opinion at [facility] though I do not know GI names there and is best she talk about this with PCP [name] [name], MD

## 2018-09-11 NOTE — Telephone Encounter (Signed)
Blankenship, Margie A, CMA  Allene Furuya, Waldemar Dickens, CMA      Previous Messages    ----- Message -----  From: Vella Kohler D  Sent: 09/11/2018  8:12 AM EST  To: Brand Males, MD, *  Subject: GI Referral                    Aline Brochure with Dr. Vena Rua office:   Spoke to pt -- current pt of Dr. Collene Mares. Pt requested a second opinion from Dr. Hilarie Fredrickson. Pt was told that [facility] doctors do not share patients. Pt states that she does not want to switch care to Dr. Hilarie Fredrickson.   Thank you,  Earnest Bailey      Above is a staff message that was received. FYI for MR.

## 2018-09-12 NOTE — Telephone Encounter (Signed)
Called and spoke with patient regarding MR recommendations Pt advised she will not be going back to Dr. Collene Mares for GI anymore She is going to [facility]- Dr. Ronnette Juniper MD office next week Pt is having allergic reactive to Apriso - GI issues Nothing further needed.

## 2018-09-12 NOTE — Telephone Encounter (Signed)
ATC Patient.  Left message on machine to call back when available.

## 2018-09-12 NOTE — Telephone Encounter (Signed)
Pt returning call CB# [phone]///[phone]

## 2018-09-15 ENCOUNTER — Telehealth: Payer: Self-pay

## 2018-09-24 NOTE — Telephone Encounter (Signed)
Message made in error

## 2018-10-01 DIAGNOSIS — K9041 Non-celiac gluten sensitivity: Secondary | ICD-10-CM | POA: Diagnosis not present

## 2018-10-01 DIAGNOSIS — K5100 Ulcerative (chronic) pancolitis without complications: Secondary | ICD-10-CM | POA: Diagnosis not present

## 2018-10-07 ENCOUNTER — Ambulatory Visit: Payer: Self-pay

## 2018-10-07 DIAGNOSIS — E039 Hypothyroidism, unspecified: Secondary | ICD-10-CM | POA: Diagnosis not present

## 2018-10-07 NOTE — Telephone Encounter (Signed)
Patient called in with c/o "cough, chills." She says "on Sunday, I developed chills, dry, hacking cough, muscle soreness, SOB, and low grade fever 99.5. I don't have a fever now, checked and it was 95.5 using the ear thermometer I have, but I have chills, not body aches but more like muscle soreness, cough and SOB. The SOB is only when I get up to walk and it's mild, no SOB at rest. The cough is dry, hacking with nothing coming up." I asked about other symptoms besides the ones mentioned, she denies. I asked about travel and/or exposure, she says "I haven't been out my house, so no travel for me. I do have a neighbor who traveled to [place] 3 weeks ago and came home to visit me last week. She was in the airports in [place], [place] and [place]." I asked has the neighbor been tested for COVID-19 or experienced any symptoms, exposed to anyone, she denies. According to protocol, see PCP within 3 days, appointment scheduled for Thursday, 10/09/18 at 1440 with Dr. [name], care advice given per protocol and COVID low risk, patient verbalized understanding.  Reason for Disposition . [1] MODERATE longstanding difficulty breathing (e.g., speaks in phrases, SOB even at rest, pulse 100-120) AND [2] SAME as normal  Answer Assessment - Initial Assessment Questions 1. RESPIRATORY STATUS: "Describe your breathing?" (e.g., wheezing, shortness of breath, unable to speak, severe coughing)      Shortness of breath, dry cough 2. ONSET: "When did this breathing problem begin?"      Sunday 3. PATTERN "Does the difficult breathing come and go, or has it been constant since it started?"      Comes and goes 4. SEVERITY: "How bad is your breathing?" (e.g., mild, moderate, severe)    - MILD: No SOB at rest, mild SOB with walking, speaks normally in sentences, can lay down, no retractions, pulse < 100.    - MODERATE: SOB at rest, SOB with minimal exertion and prefers to sit, cannot lie down flat, speaks in phrases, mild retractions,  audible wheezing, pulse 100-120.    - SEVERE: Very SOB at rest, speaks in single words, struggling to breathe, sitting hunched forward, retractions, pulse > 120      Mild 5. RECURRENT SYMPTOM: "Have you had difficulty breathing before?" If so, ask: "When was the last time?" and "What happened that time?"      Yes, bronchitis in October 6. CARDIAC HISTORY: "Do you have any history of heart disease?" (e.g., heart attack, angina, bypass surgery, angioplasty)      No 7. LUNG HISTORY: "Do you have any history of lung disease?"  (e.g., pulmonary embolus, asthma, emphysema)     Asthma 8. CAUSE: "What do you think is causing the breathing problem?"      I don't know 9. OTHER SYMPTOMS: "Do you have any other symptoms? (e.g., dizziness, runny nose, cough, chest pain, fever)     Cough, muscle soreness, chills, low grade 99.5 by ear on Sunday, afebrile today 10. PREGNANCY: "Is there any chance you are pregnant?" "When was your last menstrual period?"       No 11. TRAVEL: "Have you traveled out of the country in the last month?" (e.g., travel history, exposures)       No; neighbor traveled to [place] 3 weeks ago-[place] to [place] [employer]) came home 1 week ago and came to visit  Protocols used: BREATHING DIFFICULTY-A-AH

## 2018-10-09 ENCOUNTER — Ambulatory Visit (INDEPENDENT_AMBULATORY_CARE_PROVIDER_SITE_OTHER): Payer: BLUE CROSS/BLUE SHIELD | Admitting: Family Medicine

## 2018-10-09 ENCOUNTER — Encounter: Payer: Self-pay | Admitting: Family Medicine

## 2018-10-09 ENCOUNTER — Telehealth: Payer: Self-pay

## 2018-10-09 VITALS — BP 120/80 | HR 76 | Temp 98.6°F | Ht 60.0 in | Wt 150.0 lb

## 2018-10-09 DIAGNOSIS — R05 Cough: Secondary | ICD-10-CM | POA: Diagnosis not present

## 2018-10-09 DIAGNOSIS — R059 Cough, unspecified: Secondary | ICD-10-CM

## 2018-10-09 DIAGNOSIS — J3489 Other specified disorders of nose and nasal sinuses: Secondary | ICD-10-CM

## 2018-10-09 MED ORDER — AZELASTINE HCL 0.1 % NA SOLN: 2.0000 | Freq: Two times a day (BID) | NASAL | 12 refills | Status: DC

## 2018-10-09 MED ORDER — AZITHROMYCIN 250 MG PO TABS: ORAL_TABLET | ORAL | 0 refills | Status: DC

## 2018-10-09 NOTE — Telephone Encounter (Signed)
Questions for Screening COVID-19  Symptom onset: 10/05/18 Travel or Contacts: YES  During this illness, did/does the patient experience any of the following symptoms? Fever >100.47F []   Yes [x]   No []   Unknown Subjective fever (felt feverish) [x]   Yes []   No []   Unknown Chills [x]   Yes []   No []   Unknown Muscle aches (myalgia) [x]   Yes []   No []   Unknown Runny nose (rhinorrhea) []   Yes [x]   No []   Unknown Sore throat []   Yes [x]   No []   Unknown Cough (new onset or worsening of chronic cough) [x]   Yes []   No []   Unknown Shortness of breath (dyspnea) []   Yes [x]   No [x]   Unknown Nausea or vomiting []   Yes [x]   No []   Unknown Headache [x]   Yes []   No []   Unknown Abdominal pain  []   Yes [x]   No []   Unknown Diarrhea (?3 loose/looser than normal stools/24hr period) []   Yes [x]   No []   Unknown Other, specify:  Patient risk factors: Smoker? []   Current []   Former [x]   Never If female, currently pregnant? []   Yes [x]   No  Patient Active Problem List   Diagnosis Date Noted  . Dyslipidemia 08/15/2018  . Hypothyroidism s/p total thyroidectomy 1997 08/15/2018  . Autoimmune hepatitis ([place]) 08/16/2012  . Asthma 03/31/2012  . Ulcerative colitis ([place]) 03/31/2012  . History of renal calculi 03/31/2012  . History of migraine 03/31/2012  . Mitral valve prolapse     Plan:  []   High risk for COVID-19 with red flags go to ED (with CP, SOB, weak/lightheaded, or fever > 101.5). Call ahead.  []   High risk for COVID-19 but stable will have car visit. Inform provider and coordinate time. Will be completed in afternoon. [x]   No red flags but URI signs or symptoms will go through side door and be seen in dedicated room.  Note: Referral to telemedicine is an appropriate alternative disposition for higher risk but stable. [facility]/e-Visit: [phone].

## 2018-10-09 NOTE — Progress Notes (Signed)
   Chief Complaint:  Danielle Bond is a 58 y.o. female who presents for same day appointment with a chief complaint of cough.   Assessment/Plan:  Cough No red flags. Low risk for COVID19 - does not need testing. She has some ear effusion which could represent early AOM. Most of her symptoms likely secondary to allergies.  Start Astelin nasal spray.  Start over-the-counter Flonase.  Sent in a "pocket prescription" for azithromycin with strict instruction to not start unless symptoms worsen or do not improve within the next few days.  Encouraged good oral hydration.  Discussed reasons to return to care.  Follow-up as needed.     Subjective:  HPI:  Cough, acute problem Started 4 days ago. Stable over that time. Associated with chills, myalgias, and subjective fevers. No recent travel however did have contact with a neighbor who traveled to [place] 3 weeks ago and was in airports in [place], [place], and [place]. Her friend does not have any current symptoms. She has not tried any treatments. She has some associated left ear pain. No other obvious alleviating or aggravating factors.   ROS: Per HPI  PMH: She reports that she has never smoked. She has never used smokeless tobacco. She reports that she does not drink alcohol or use drugs.      Objective:  Physical Exam: BP 120/80 (BP Location: Left Arm, Patient Position: Sitting, Cuff Size: Normal)   Pulse 76   Temp 98.6 F (37 C) (Oral)   Ht 5' (1.524 m)   Wt 150 lb (68 kg)   LMP 03/03/2013   SpO2 99%   BMI 29.29 kg/m   Gen: NAD, resting comfortably HEENT: Right TM erythematous. Left TM clear. OP clear. Nasal mucosa erythematous and boggy bilaterally with clear discharge.  CV: Regular rate and rhythm with no murmurs appreciated Pulm: Normal work of breathing, clear to auscultation bilaterally with no crackles, wheezes, or rhonchi     [name]. [name], MD 10/09/2018 2:24 PM

## 2018-10-09 NOTE — Patient Instructions (Signed)
Start the astelin.  Start the zpack if your symptoms worsen or do not improve in a few days.  Please stay well hydrated.  You can take tylenol and/or motrin as needed for low grade fever and pain.  Please let me know if your symptoms worsen or fail to improve.  Take care, Dr [name]

## 2018-10-09 NOTE — Telephone Encounter (Signed)
This encounter was created in error - please disregard.

## 2018-10-20 ENCOUNTER — Ambulatory Visit: Payer: Self-pay | Admitting: Family Medicine

## 2018-10-20 NOTE — Telephone Encounter (Signed)
I returned pt's call.   She saw Dr. [name] on 10/09/2018 for non productive cough.   She still has this cough that is not better.   She is using the Astelin and states her nose is completely clear.   She feels like it's hard to breath sometimes not in distress.  (coronavirus pandemic going on).   No symptoms of that:  Fever, sore throat or body aches.  She was just started on Humira.  Her immune system is low.   She didn't know if she should take the Z-Pak Dr. [name] told her to take only if she wasn't better in a few days.     I let her know someone will be in touch with her.  I sent these notes to Dr. [name].  Reason for Disposition . [1] Continuous (nonstop) coughing interferes with work or school AND [2] no improvement using cough treatment per protocol  Answer Assessment - Initial Assessment Questions 1. ONSET: "When did the cough begin?"      I have a dry hacking cough.   I'm taking the Astelin and Flonase.  I started Humaria last Wed.    I don't know if I should take the Z-Pak?   2.  The cough is consistent day and night.  No difference. 3. RESPIRATORY DISTRESS: "Describe your breathing."      No chest pain.   Nose is completely clear.   4. FEVER: "Do you have a fever?" If so, ask: "What is your temperature, how was it measured, and when did it start?"     No 5. HEMOPTYSIS: "Are you coughing up any blood?" If so ask: "How much?" (flecks, streaks, tablespoons, etc.)     No 6. TREATMENT: "What have you done so far to treat the cough?" (e.g., meds, fluids, humidifier)     See above.   I'm using my humidifier all the time.  My immune system is low.   I was dx with Crohn's Disease. 7. CARDIAC HISTORY: "Do you have any history of heart disease?" (e.g., heart attack, congestive heart failure)      Not asked 8. LUNG HISTORY: "Do you have any history of lung disease?"  (e.g., pulmonary embolus, asthma, emphysema)     *No Answer* 9. PE RISK FACTORS: "Do you have a history of blood clots?" (or:  recent major surgery, recent prolonged travel, bedridden)     *No Answer* 10. OTHER SYMPTOMS: "Do you have any other symptoms? (e.g., runny nose, wheezing, chest pain)       *No Answer* 11. PREGNANCY: "Is there any chance you are pregnant?" "When was your last menstrual period?"       *No Answer* 12. TRAVEL: "Have you traveled out of the country in the last month?" (e.g., travel history, exposures)       *No Answer*  Protocols used: COUGH - ACUTE NON-PRODUCTIVE-A-AH

## 2018-10-20 NOTE — Telephone Encounter (Signed)
See note

## 2018-10-21 NOTE — Telephone Encounter (Signed)
Spoke with patient and gave instructions.  Patient verbalized understanding and will call back if not feeling better after Z-pac.

## 2018-10-21 NOTE — Telephone Encounter (Signed)
Ok to start zpac. Needs OV if not improving.  [name]. [name], MD 10/21/2018 12:19 PM

## 2018-10-21 NOTE — Telephone Encounter (Signed)
Please advise 

## 2018-10-24 DIAGNOSIS — M79643 Pain in unspecified hand: Secondary | ICD-10-CM | POA: Diagnosis not present

## 2018-10-24 DIAGNOSIS — M2550 Pain in unspecified joint: Secondary | ICD-10-CM | POA: Diagnosis not present

## 2018-10-24 DIAGNOSIS — K5190 Ulcerative colitis, unspecified, without complications: Secondary | ICD-10-CM | POA: Diagnosis not present

## 2018-10-24 DIAGNOSIS — M7531 Calcific tendinitis of right shoulder: Secondary | ICD-10-CM | POA: Diagnosis not present

## 2018-10-27 ENCOUNTER — Telehealth: Payer: Self-pay

## 2018-10-27 NOTE — Telephone Encounter (Signed)
Danielle Bond called office today to speak with Dr. Megan Salon with questions from her last visit.Patient would like to know if her symptoms from last year could be related to Covid-19. Patient states she currently does not have any symptoms besides persistent cough.  [place]

## 2018-10-27 NOTE — Telephone Encounter (Signed)
Please let her know that her illness last year was not due to COVID-19.  Her illness with fever began months before the first cases were diagnosed in [place] and even longer before the first known cases in the [name].

## 2018-10-30 ENCOUNTER — Telehealth: Payer: Self-pay | Admitting: Internal Medicine

## 2018-10-30 MED ORDER — FLUTICASONE FUROATE-VILANTEROL 100-25 MCG/INH IN AEPB: 1.0000 | INHALATION_SPRAY | Freq: Every day | RESPIRATORY_TRACT | 5 refills | Status: DC

## 2018-10-30 NOTE — Telephone Encounter (Signed)
Breo Rx sent to pt's preferred pharmacy. Attempted to call pt but unable to reach her. Left a detailed message for pt letting her know that this was taken care of for her. Nothing further needed.

## 2018-10-31 ENCOUNTER — Ambulatory Visit: Payer: Self-pay | Admitting: Family Medicine

## 2018-10-31 NOTE — Telephone Encounter (Signed)
Pt called in c/o continued shortness of breath and a dry hacking cough.   She was seen by Dr. [name] on 10/09/2018 for these same symptoms and given a Z Pak.  It has not helped.  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  She said she would rather wait until Monday because she did not want to go to the urgent care.    Reason for Disposition . [1] Longstanding difficulty breathing (e.g., CHF, COPD, emphysema) AND [2] WORSE than normal  Answer Assessment - Initial Assessment Questions 1. RESPIRATORY STATUS: "Describe your breathing?" (e.g., wheezing, shortness of breath, unable to speak, severe coughing)      Shortness of breath.   I finished a Z Pak.  The Astilin helped the nasal congestion.     I'm still short of breath. 2. ONSET: "When did this breathing problem begin?"      A couple of weeks ago he prescribed the Z-Pak.  I'm using a humidifier. 3. PATTERN "Does the difficult breathing come and go, or has it been constant since it started?"      It comes and goes.   I feel like I'm pregnant and the baby is pushing up on my chest. 4. SEVERITY: "How bad is your breathing?" (e.g., mild, moderate, severe)    - MILD: No SOB at rest, mild SOB with walking, speaks normally in sentences, can lay down, no retractions, pulse < 100.    - MODERATE: SOB at rest, SOB with minimal exertion and prefers to sit, cannot lie down flat, speaks in phrases, mild retractions, audible wheezing, pulse 100-120.    - SEVERE: Very SOB at rest, speaks in single words, struggling to breathe, sitting hunched forward, retractions, pulse > 120      It's not my asthma.    I'm having a dry cough.   I've had it longer for about 3 week.    Back in Nov I had fever and bronchitis.   The fever was from a unknown. 5. RECURRENT SYMPTOM: "Have you had difficulty breathing before?" If so, ask: "When was the last time?" and "What happened that time?"      Yes.   In  Nov.    It started with a colitis flare up back in Nov.    6. CARDIAC HISTORY: "Do you have any history of heart disease?" (e.g., heart attack, angina, bypass surgery, angioplasty)      No heart problem. 7. LUNG HISTORY: "Do you have any history of lung disease?"  (e.g., pulmonary embolus, asthma, emphysema)     Asthma.    8. CAUSE: "What do you think is causing the breathing problem?"      I'm not sure.    9. OTHER SYMPTOMS: "Do you have any other symptoms? (e.g., dizziness, runny nose, cough, chest pain, fever)     Dry cough, no fever.  I get lightheaded once in a while.    I just started Humira. 10. PREGNANCY: "Is there any chance you are pregnant?" "When was your last menstrual period?"       Not asked due to age 58. TRAVEL: "Have you traveled out of the country in the last month?" (e.g., travel history, exposures)       No travels.    I've been staying home.  Protocols used: BREATHING DIFFICULTY-A-AH

## 2018-11-03 NOTE — Telephone Encounter (Signed)
Please advise.  Ok to schedule virtual visit?

## 2018-11-03 NOTE — Telephone Encounter (Signed)
See Triage note

## 2018-11-03 NOTE — Telephone Encounter (Signed)
Patient has been scheduled for an appointment.

## 2018-11-03 NOTE — Telephone Encounter (Signed)
Yes. Please schedule virtual office visit.  Danielle Bond. [name], MD 11/03/2018 8:40 AM

## 2018-11-03 NOTE — Telephone Encounter (Signed)
LM for patient to be scheduled.

## 2018-11-04 ENCOUNTER — Ambulatory Visit (INDEPENDENT_AMBULATORY_CARE_PROVIDER_SITE_OTHER): Payer: BLUE CROSS/BLUE SHIELD | Admitting: Family Medicine

## 2018-11-04 ENCOUNTER — Encounter: Payer: Self-pay | Admitting: Family Medicine

## 2018-11-04 VITALS — BP 167/83 | HR 88 | Temp 96.2°F | Ht 60.0 in | Wt 149.0 lb

## 2018-11-04 DIAGNOSIS — B009 Herpesviral infection, unspecified: Secondary | ICD-10-CM | POA: Diagnosis not present

## 2018-11-04 DIAGNOSIS — R05 Cough: Secondary | ICD-10-CM | POA: Diagnosis not present

## 2018-11-04 DIAGNOSIS — R059 Cough, unspecified: Secondary | ICD-10-CM

## 2018-11-04 MED ORDER — VALACYCLOVIR HCL 500 MG PO TABS: 500.0000 mg | ORAL_TABLET | Freq: Two times a day (BID) | ORAL | 0 refills | Status: DC

## 2018-11-04 NOTE — Assessment & Plan Note (Signed)
Sent in refill on Valtrex.  Patient states she usually takes 500 mg twice daily for 1 week for outbreaks.

## 2018-11-04 NOTE — Progress Notes (Signed)
    Chief Complaint:  Danielle Bond is a 58 y.o. female who presents today for a virtual office visit with a chief complaint of cough.   Assessment/Plan:  Cough No red flags.  Likely postinfectious cough.  Given her immunosuppression on Humira, it is reasonable to check chest x-ray to rule out any other atypical infections or other causes of her cough.  She will come into the office to have this done.  If x-ray is negative, will treat symptomatically.  Consider course of prednisone or cough suppressants.  Discussed reasons to return to care and seek emergent care.  HSV-2 (herpes simplex virus 2) infection Sent in refill on Valtrex.  Patient states she usually takes 500 mg twice daily for 1 week for outbreaks.    Subjective:  HPI:  Cough  Patient seen about a month ago for this.  At that time symptoms were thought to be secondary to allergies.  She was started on Astelin nasal spray and Flonase nasal spray.  Neither of these have significantly help.  She was also given a course of azithromycin which has not helped either.  She has a history of underlying asthma, however states that her current cough is not consistent with prior asthma flares.  She has been following with pulmonology and has been compliant with her daily Breo.  She has had some shortness of breath associate with a cough.  Symptoms are consistent throughout the day.  Not worse with exertion.  She has a bit of chest tightness in her chest when coughing.  No fevers.  She has had a few mild chills.  Cough is nonproductive.  No other obvious alleviating or aggravating factors.  #History of HSV-2 infection Chronic problem.  New problem to provider.  Has had recent outbreak due to increased stress due to COVID-19 pandemic.  Requests refill on Valtrex.  ROS: Per HPI  PMH: She reports that she has never smoked. She has never used smokeless tobacco. She reports that she does not drink alcohol or use drugs.      Objective/Observations   Physical Exam: Gen: NAD, resting comfortably Pulm: Normal work of breathing, speaking in full sentences Neuro: Grossly normal, moves all extremities Psych: Normal affect and thought content  Virtual Visit via Video   I connected with Danielle Bond on 11/04/18 at  9:20 AM EDT by a video enabled telemedicine application and verified that I am speaking with the correct person using two identifiers. I discussed the limitations of evaluation and management by telemedicine and the availability of in person appointments. The patient expressed understanding and agreed to proceed.   Patient location: Home Provider location: [place] participating in the virtual visit: Myself and patient     [name]. [name], MD 11/04/2018 9:55 AM

## 2018-11-05 ENCOUNTER — Ambulatory Visit (INDEPENDENT_AMBULATORY_CARE_PROVIDER_SITE_OTHER): Payer: BLUE CROSS/BLUE SHIELD

## 2018-11-05 DIAGNOSIS — R05 Cough: Secondary | ICD-10-CM | POA: Diagnosis not present

## 2018-11-05 DIAGNOSIS — R059 Cough, unspecified: Secondary | ICD-10-CM

## 2018-11-07 ENCOUNTER — Other Ambulatory Visit: Payer: Self-pay | Admitting: Family Medicine

## 2018-11-07 MED ORDER — GUAIFENESIN-CODEINE 100-10 MG/5ML PO SOLN: 5.0000 mL | Freq: Three times a day (TID) | ORAL | 0 refills | Status: DC | PRN

## 2018-11-07 NOTE — Progress Notes (Signed)
i

## 2018-11-07 NOTE — Progress Notes (Signed)
Please inform patient of the following:  Her chest xray is NORMAL. She has no signs of pneumonia, infection, or any other abnormality. We can treat her cough symptomatically and it should continue to improve over the coming weeks. Ok to send in cough syrup if she wishes to try that to help with her symptoms.  [name]. [name], MD 11/07/2018 7:17 AM

## 2018-11-18 IMAGING — CT CT ABD-PELV W/ CM
2 of 5 series · 16 of 46 positions shown, 18 images · IV contrast (iopamidol)
Comparison: 08/15/2012

CLINICAL DATA: Generalized abdominal pain. History of pancreatitis.

EXAM:
CT ABDOMEN AND PELVIS WITH CONTRAST
TECHNIQUE: Multidetector CT imaging of the abdomen and pelvis was performed
using the standard protocol following bolus administration of
intravenous contrast.
CONTRAST:  100mL [id number] IOPAMIDOL ([id number]) INJECTION 61%

[Series 2: axial st · axial · 0.89mm/px · z∈[-891,-536]mm · 13 of 83 slices shown, 15 images]
[im 6/83  soft-tissue]
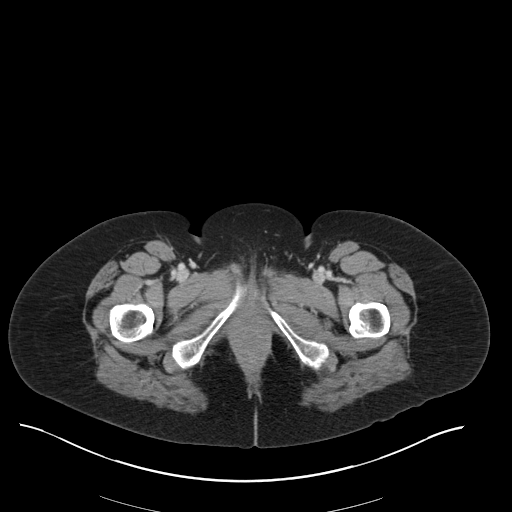
[im 6/83  bone]
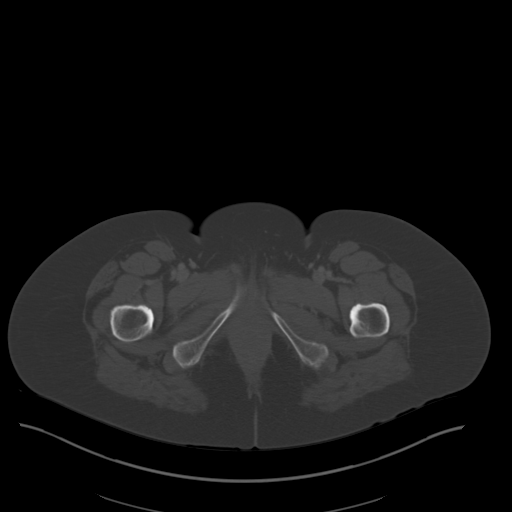
[im 12/83  soft-tissue]
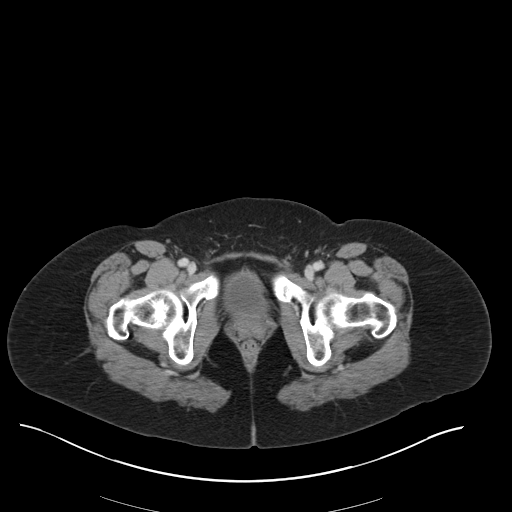
[im 18/83  soft-tissue]
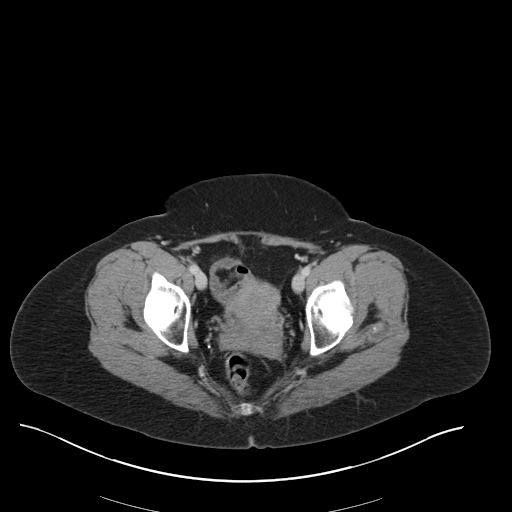
[im 24/83  soft-tissue]
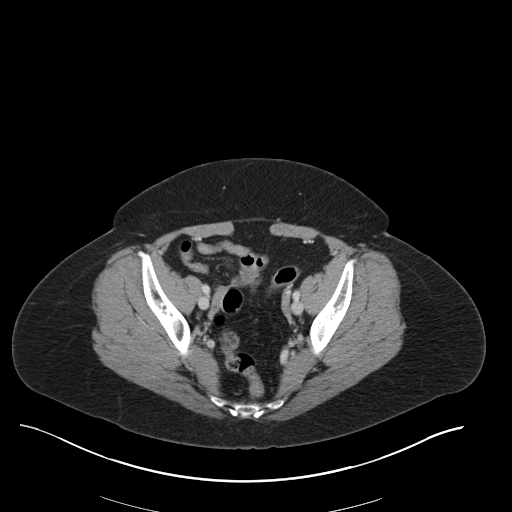
[im 30/83  soft-tissue]
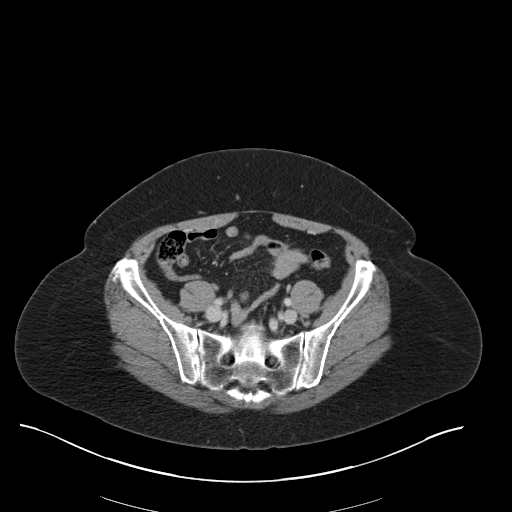
[im 36/83  soft-tissue]
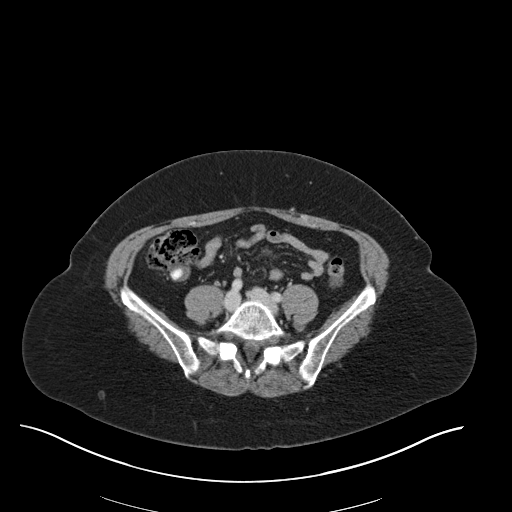
[im 42/83  soft-tissue]
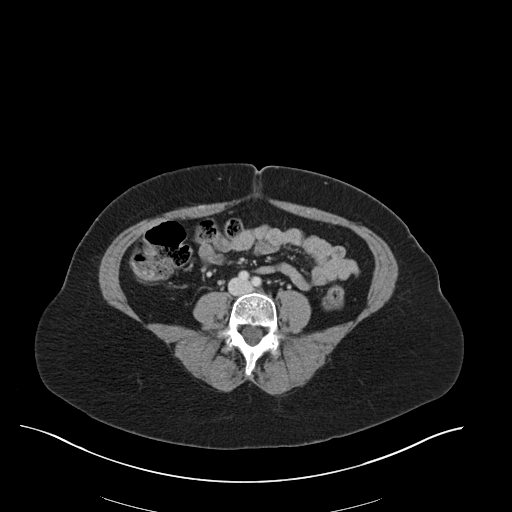
[im 47/83  soft-tissue]
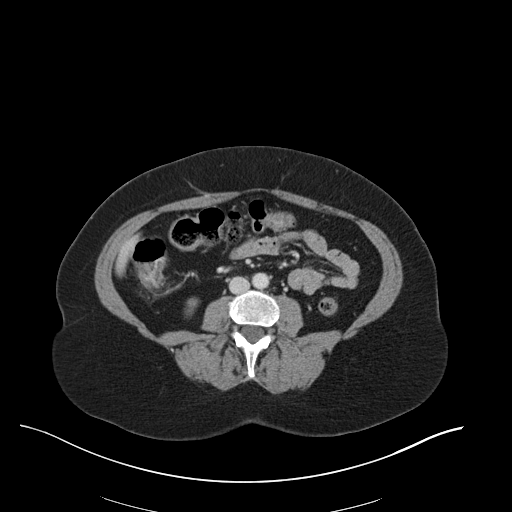
[im 53/83  soft-tissue]
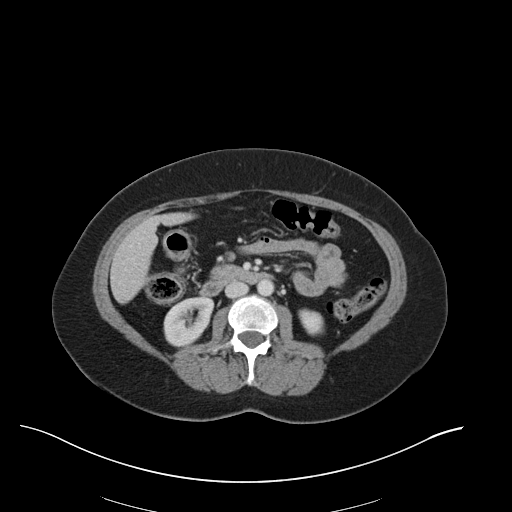
[im 53/83  bone]
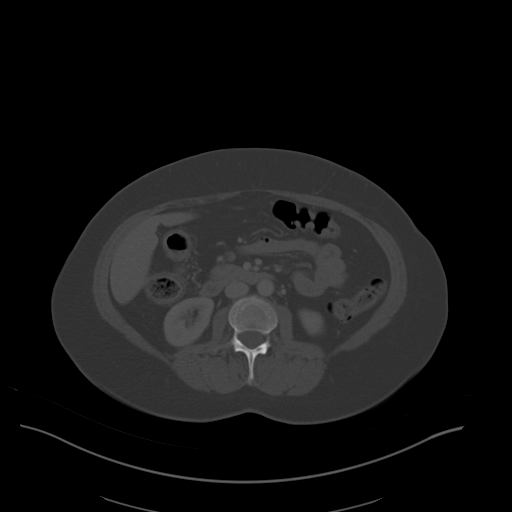
[im 59/83  soft-tissue]
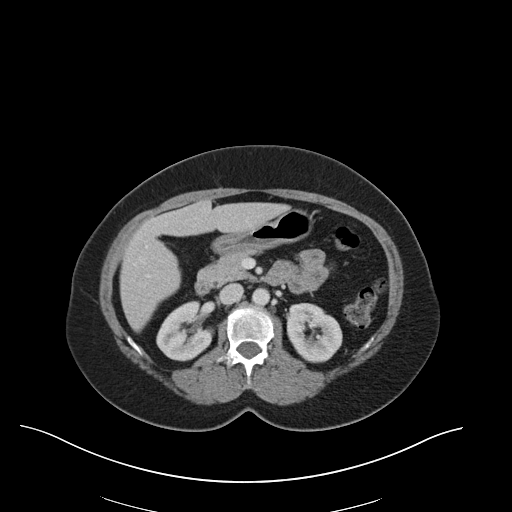
[im 65/83  soft-tissue]
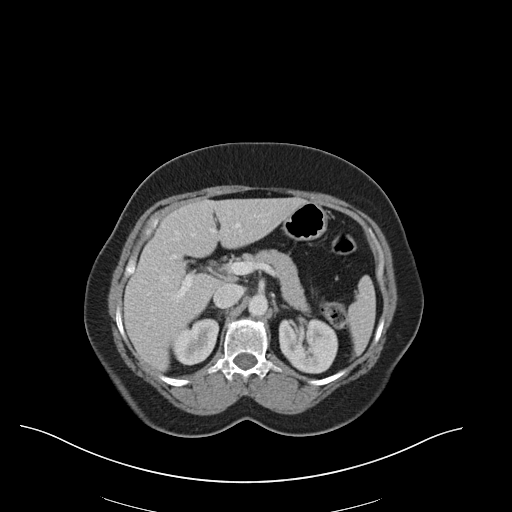
[im 71/83  soft-tissue]
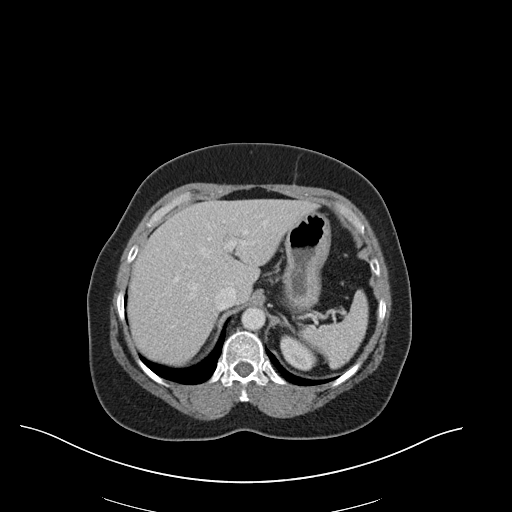
[im 77/83  soft-tissue]
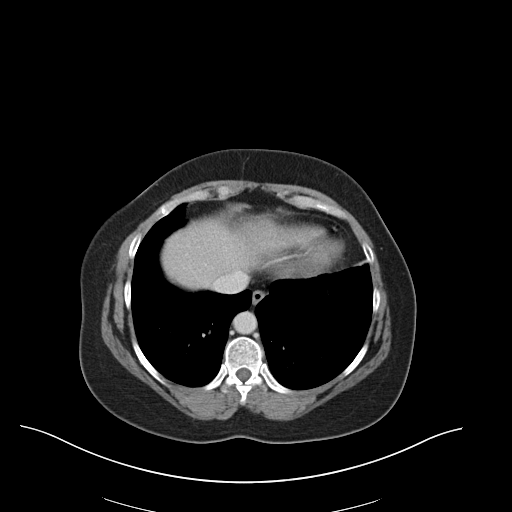

[Series 5: coronal st · coronal · 0.72mm/px · 3 of 98 slices shown]
[im 33/98  soft-tissue]
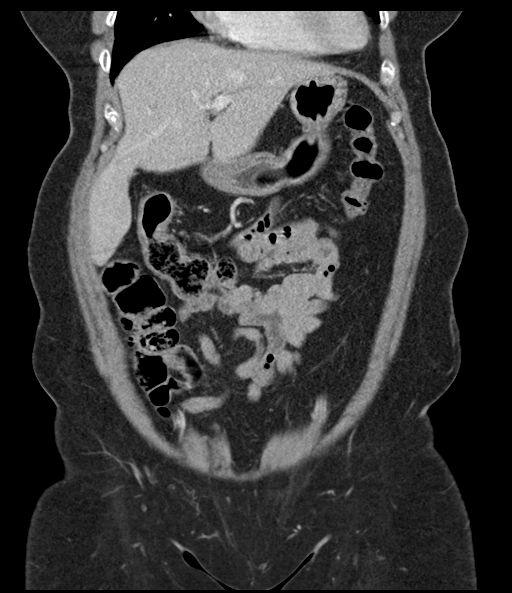
[im 44/98  soft-tissue]
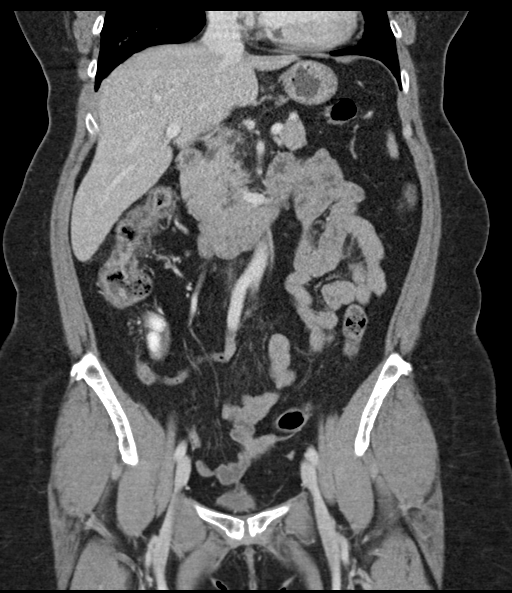
[im 54/98  soft-tissue]
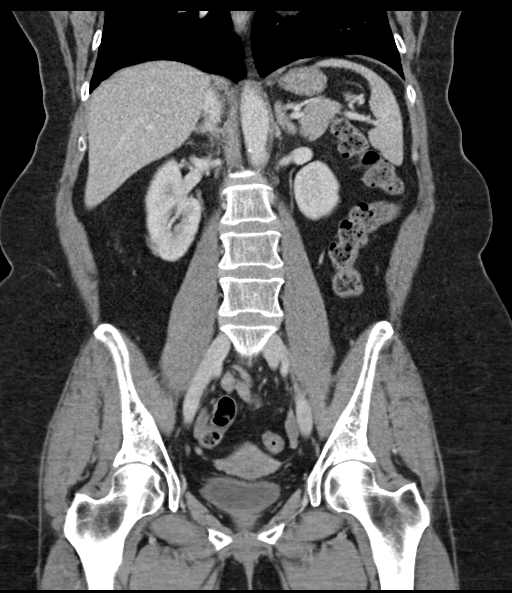

[16 of 46 positions shown; findings below may reference images not displayed]

FINDINGS: Lower chest: Lung bases are clear. No effusions. Heart is normal
size.

Hepatobiliary: No focal liver abnormality is seen. Status post
cholecystectomy. No biliary dilatation.

Pancreas: No focal abnormality or ductal dilatation. No
peripancreatic inflammation.

Spleen: No focal abnormality.  Normal size.

Adrenals/Urinary Tract: No adrenal abnormality. No focal renal
abnormality. No stones or hydronephrosis. Urinary bladder is
unremarkable.

Stomach/Bowel: Normal appendix. Stomach, large and small bowel
grossly unremarkable.

Vascular/Lymphatic: No evidence of aneurysm or adenopathy.

Reproductive: Uterus and adnexa unremarkable.  No mass.

Other: No free fluid or free air.

Musculoskeletal: No acute bony abnormality.
IMPRESSION: No acute findings in the abdomen or pelvis.

## 2018-12-10 ENCOUNTER — Telehealth: Payer: Self-pay | Admitting: Family Medicine

## 2018-12-10 DIAGNOSIS — K5100 Ulcerative (chronic) pancolitis without complications: Secondary | ICD-10-CM | POA: Diagnosis not present

## 2018-12-10 NOTE — Telephone Encounter (Signed)
Patient wants to get antibody test, refuses to make another virtual appointment to discuss, she stated she already discussed. She mentioned she spoke to [facility] and they told her to call to get tested. Call patient to advise at [phone].

## 2018-12-10 NOTE — Telephone Encounter (Signed)
We can do antibody test but not sure if insurance will cover it.  [name]. [name], MD 12/10/2018 12:24 PM

## 2018-12-10 NOTE — Telephone Encounter (Signed)
Please advise 

## 2018-12-12 ENCOUNTER — Other Ambulatory Visit: Payer: Self-pay

## 2018-12-12 DIAGNOSIS — Z20828 Contact with and (suspected) exposure to other viral communicable diseases: Secondary | ICD-10-CM

## 2018-12-12 NOTE — Telephone Encounter (Signed)
Patient wants an order for the antibody test for Covid 19.

## 2018-12-12 NOTE — Telephone Encounter (Signed)
Patient notified and order placed.

## 2018-12-12 NOTE — Telephone Encounter (Signed)
Patient notified

## 2018-12-16 NOTE — Telephone Encounter (Signed)
Entered in error This encounter was created in error - please disregard.

## 2018-12-17 ENCOUNTER — Ambulatory Visit: Payer: BLUE CROSS/BLUE SHIELD | Admitting: Family Medicine

## 2018-12-17 ENCOUNTER — Other Ambulatory Visit (INDEPENDENT_AMBULATORY_CARE_PROVIDER_SITE_OTHER): Payer: BLUE CROSS/BLUE SHIELD

## 2018-12-17 DIAGNOSIS — Z20828 Contact with and (suspected) exposure to other viral communicable diseases: Secondary | ICD-10-CM

## 2018-12-18 LAB — SAR COV2 SEROLOGY (COVID19)AB(IGG),IA: SARS CoV2 AB IGG: NEGATIVE

## 2018-12-18 NOTE — Progress Notes (Signed)
Dr Marigene Ehlers interpretation of your lab work:  Your antibody test is NEGATIVE. This means that you have not been exposed to the Delta virus and that you are still susceptible to infection.    If you have any additional questions, please give [name] a call or send [name] a message through [place].  Take care, Dr [name]

## 2019-01-14 DIAGNOSIS — K5100 Ulcerative (chronic) pancolitis without complications: Secondary | ICD-10-CM | POA: Diagnosis not present

## 2019-01-29 DIAGNOSIS — E039 Hypothyroidism, unspecified: Secondary | ICD-10-CM | POA: Diagnosis not present

## 2019-01-29 DIAGNOSIS — E119 Type 2 diabetes mellitus without complications: Secondary | ICD-10-CM | POA: Diagnosis not present

## 2019-02-03 DIAGNOSIS — E559 Vitamin D deficiency, unspecified: Secondary | ICD-10-CM | POA: Diagnosis not present

## 2019-02-03 DIAGNOSIS — Z1231 Encounter for screening mammogram for malignant neoplasm of breast: Secondary | ICD-10-CM | POA: Diagnosis not present

## 2019-02-03 DIAGNOSIS — Z1211 Encounter for screening for malignant neoplasm of colon: Secondary | ICD-10-CM | POA: Diagnosis not present

## 2019-02-03 DIAGNOSIS — Z1159 Encounter for screening for other viral diseases: Secondary | ICD-10-CM | POA: Diagnosis not present

## 2019-02-03 DIAGNOSIS — Z113 Encounter for screening for infections with a predominantly sexual mode of transmission: Secondary | ICD-10-CM | POA: Diagnosis not present

## 2019-02-03 DIAGNOSIS — R309 Painful micturition, unspecified: Secondary | ICD-10-CM | POA: Diagnosis not present

## 2019-02-03 DIAGNOSIS — Z6830 Body mass index (BMI) 30.0-30.9, adult: Secondary | ICD-10-CM | POA: Diagnosis not present

## 2019-02-03 DIAGNOSIS — Z01419 Encounter for gynecological examination (general) (routine) without abnormal findings: Secondary | ICD-10-CM | POA: Diagnosis not present

## 2019-02-05 DIAGNOSIS — M2669 Other specified disorders of temporomandibular joint: Secondary | ICD-10-CM | POA: Diagnosis not present

## 2019-02-05 DIAGNOSIS — H9201 Otalgia, right ear: Secondary | ICD-10-CM | POA: Diagnosis not present

## 2019-02-05 DIAGNOSIS — H60391 Other infective otitis externa, right ear: Secondary | ICD-10-CM | POA: Diagnosis not present

## 2019-03-03 ENCOUNTER — Ambulatory Visit: Payer: Self-pay | Admitting: *Deleted

## 2019-03-03 ENCOUNTER — Emergency Department (HOSPITAL_COMMUNITY)
Admission: EM | Admit: 2019-03-03 | Discharge: 2019-03-03 | Disposition: A | Discharge status: Home / Self Care | Payer: BC Managed Care – PPO | Attending: Emergency Medicine | Admitting: Emergency Medicine

## 2019-03-03 ENCOUNTER — Other Ambulatory Visit: Payer: Self-pay

## 2019-03-03 ENCOUNTER — Encounter (HOSPITAL_COMMUNITY): Payer: Self-pay

## 2019-03-03 ENCOUNTER — Emergency Department (HOSPITAL_COMMUNITY): Payer: BC Managed Care – PPO

## 2019-03-03 DIAGNOSIS — R209 Unspecified disturbances of skin sensation: Secondary | ICD-10-CM | POA: Diagnosis not present

## 2019-03-03 DIAGNOSIS — R531 Weakness: Secondary | ICD-10-CM | POA: Diagnosis not present

## 2019-03-03 DIAGNOSIS — Z79899 Other long term (current) drug therapy: Secondary | ICD-10-CM | POA: Insufficient documentation

## 2019-03-03 DIAGNOSIS — R42 Dizziness and giddiness: Secondary | ICD-10-CM | POA: Insufficient documentation

## 2019-03-03 DIAGNOSIS — I10 Essential (primary) hypertension: Secondary | ICD-10-CM | POA: Diagnosis not present

## 2019-03-03 DIAGNOSIS — G43809 Other migraine, not intractable, without status migrainosus: Secondary | ICD-10-CM | POA: Diagnosis not present

## 2019-03-03 DIAGNOSIS — J45909 Unspecified asthma, uncomplicated: Secondary | ICD-10-CM | POA: Diagnosis not present

## 2019-03-03 DIAGNOSIS — R51 Headache: Secondary | ICD-10-CM | POA: Diagnosis not present

## 2019-03-03 DIAGNOSIS — E039 Hypothyroidism, unspecified: Secondary | ICD-10-CM | POA: Diagnosis not present

## 2019-03-03 DIAGNOSIS — R52 Pain, unspecified: Secondary | ICD-10-CM | POA: Diagnosis not present

## 2019-03-03 LAB — CBC
HCT: 41.9 % (ref 36.0–46.0)
Hemoglobin: 13.2 g/dL (ref 12.0–15.0)
MCH: 28.3 pg (ref 26.0–34.0)
MCHC: 31.5 g/dL (ref 30.0–36.0)
MCV: 89.7 fL (ref 80.0–100.0)
Platelets: 281 10*3/uL (ref 150–400)
RBC: 4.67 MIL/uL (ref 3.87–5.11)
RDW: 12 % (ref 11.5–15.5)
WBC: 6 10*3/uL (ref 4.0–10.5)
nRBC: 0 % (ref 0.0–0.2)

## 2019-03-03 LAB — BASIC METABOLIC PANEL
Anion gap: 10 (ref 5–15)
BUN: 14 mg/dL (ref 6–20)
CO2: 24 mmol/L (ref 22–32)
Calcium: 9.4 mg/dL (ref 8.9–10.3)
Chloride: 105 mmol/L (ref 98–111)
Creatinine, Ser: 0.76 mg/dL (ref 0.44–1.00)
GFR calc Af Amer: >60 mL/min (ref >60)
GFR calc non Af Amer: >60 mL/min (ref >60)
Glucose, Bld: 105 mg/dL — ABNORMAL HIGH (ref 70–99)
Potassium: 3.6 mmol/L (ref 3.5–5.1)
Sodium: 139 mmol/L (ref 135–145)

## 2019-03-03 MED ORDER — PROCHLORPERAZINE EDISYLATE 10 MG/2ML IJ SOLN
10.0000 mg | Freq: Once | INTRAMUSCULAR | Status: AC
  Administered 2019-03-03: 10 mg via INTRAVENOUS
  Filled 2019-03-03: qty 2

## 2019-03-03 MED ORDER — DIPHENHYDRAMINE HCL 50 MG/ML IJ SOLN
12.5000 mg | Freq: Once | INTRAMUSCULAR | Status: AC
  Administered 2019-03-03: 12.5 mg via INTRAVENOUS
  Filled 2019-03-03: qty 1

## 2019-03-03 NOTE — ED Triage Notes (Signed)
Pt BIB EMS from [facility] parking lot. Pt c/o stabbing migraine x 2 days. Pt has hx of migraines. Pt c/o lightheadedness, L arm weakness starting today. Pt took magnesium for migraine, pt stated it helped.

## 2019-03-03 NOTE — Discharge Instructions (Addendum)
Continue your current medications.  Follow-up with your primary care doctor as we discussed to discuss alternative treatment regimen for your migraine.  Return to the ED if you have any recurrent neurologic symptoms.

## 2019-03-03 NOTE — Telephone Encounter (Signed)
Patient states she woke up with weakness in left arm. Patient has pain and weakness in right arm- she has bursa in that side. Patient has dizziness and lightheadedness for 1 week now. She states she has had a pericing headache for the last few days- she states she has hx of migraine. Piercing headache is on the R side of head.Patient does not have migraine medication. Patient reports chest tightness- not pain.Patient advised due to her new onset arm weakness, headache and dizziness- protocol advised ED- patient declines to go to ED- she wants to be seen in the office. Call to office to see if they are familiar with patient and if PCP would see her.  Patient was sick in November and just does not want to go to Ed- advised PCP is recommending ED- UC if she will not go.Patient is not happy- she wants to see her provider.  Reason for Disposition . [1] MODERATE headache (e.g., interferes with normal activities) AND [2] present > 24 hours AND [3] unexplained  (Exceptions: analgesics not tried, typical migraine, or headache part of viral illness) . [1] MODERATE dizziness (e.g., interferes with normal activities) AND [2] has NOT been evaluated by physician for this  (Exception: dizziness caused by heat exposure, sudden standing, or poor fluid intake) . Headache  (and neurologic deficit)  Answer Assessment - Initial Assessment Questions 1. SYMPTOM: "What is the main symptom you are concerned about?" (e.g., weakness, numbness)     Arm weakness patient reports dizziness for about a week 2. ONSET: "When did this start?" (minutes, hours, days; while sleeping)     Weakness in arm is new and jaw pain 3. LAST NORMAL: "When was the last time you were normal (no symptoms)?"     2 weeks ago 4. PATTERN "Does this come and go, or has it been constant since it started?"  "Is it present now?"     Constant since she got up this morning 5. CARDIAC SYMPTOMS: "Have you had any of the following symptoms: chest pain,  difficulty breathing, palpitations?"     Tightness in chest 6. NEUROLOGIC SYMPTOMS: "Have you had any of the following symptoms: headache, dizziness, vision loss, double vision, changes in speech, unsteady on your feet?"     Headache, dizziness, trouble focus 7. OTHER SYMPTOMS: "Do you have any other symptoms?"     no 8. PREGNANCY: "Is there any chance you are pregnant?" "When was your last menstrual period?"     n/a  Answer Assessment - Initial Assessment Questions 1. DESCRIPTION: "Describe your dizziness."     Feels like she got up too fast 2. LIGHTHEADED: "Do you feel lightheaded?" (e.g., somewhat faint, woozy, weak upon standing)     Yes- not faint 3. VERTIGO: "Do you feel like either you or the room is spinning or tilting?" (i.e. vertigo)     Patient is different 4. SEVERITY: "How bad is it?"  "Do you feel like you are going to faint?" "Can you stand and walk?"   - MILD - walking normally   - MODERATE - interferes with normal activities (e.g., work, school)    - SEVERE - unable to stand, requires support to walk, feels like passing out now.      moderate 5. ONSET:  "When did the dizziness begin?"     1 week  6. AGGRAVATING FACTORS: "Does anything make it worse?" (e.g., standing, change in head position)     Walking makes worse- patient has to stop 7. HEART RATE: "Can you  tell me your heart rate?" "How many beats in 15 seconds?"  (Note: not all patients can do this)       BP- 147/89 P 86 8. CAUSE: "What do you think is causing the dizziness?"     unsure 9. RECURRENT SYMPTOM: "Have you had dizziness before?" If so, ask: "When was the last time?" "What happened that time?"     Hx of vertigo- patient has had this type of dizziness with inflamation 10. OTHER SYMPTOMS: "Do you have any other symptoms?" (e.g., fever, chest pain, vomiting, diarrhea, bleeding)       no 11. PREGNANCY: "Is there any chance you are pregnant?" "When was your last menstrual period?"       n/a  Protocols  used: HEADACHE-A-AH, DIZZINESS - LIGHTHEADEDNESS-A-AH, NEUROLOGIC DEFICIT-A-AH

## 2019-03-03 NOTE — Telephone Encounter (Signed)
Left voice message for patient to call clinic. Called patient  regarding Symptoms below,and  to check to see if patient is willing to go to ED.

## 2019-03-03 NOTE — Telephone Encounter (Signed)
Noted. Agree with ED dispo.  [name]. [name], MD 03/03/2019 2:25 PM

## 2019-03-03 NOTE — ED Provider Notes (Signed)
[facility] DEPT Provider Note   CSN: [id number] Arrival date & time: 03/03/19  1538    History   Chief Complaint Chief Complaint  Patient presents with  . Migraine  . Extremity Weakness    HPI Danielle Bond is a 58 y.o. female.     HPI Pt states she started having a headache a couple of days ago.  She is having a sharp piercing pain.  SHe has been feeling lightheaded and dizzy.  This morning when she woke up she had trouble moving her arm.  It felt tight in her left upper arm. BP was running high today.  No trouble with speech.  No numbness.  Pt has history of hemiplegic migraines.  She took her medication for that , magnesium and b12 and she felt like her arm moved a little better but the ha persisted.  Pt called her doctor and was instructed to come to the ED> Past Medical History:  Diagnosis Date  . Asthma   . Chronic kidney disease   . Hx of pancreatitis 06/16/2018  . Kidney stone   . Kidney stone   . Migraine   . Orthostatic hypotension   . Osteopenia 03/31/2012  . Ulcerative colitis ([place]) 03/31/2012    Patient Active Problem List   Diagnosis Date Noted  . HSV-2 (herpes simplex virus 2) infection 11/04/2018  . Dyslipidemia 08/15/2018  . Hypothyroidism s/p total thyroidectomy 1997 08/15/2018  . Autoimmune hepatitis ([place]) 08/16/2012  . Asthma 03/31/2012  . Ulcerative colitis ([place]) 03/31/2012  . History of renal calculi 03/31/2012  . History of migraine 03/31/2012  . Mitral valve prolapse     Past Surgical History:  Procedure Laterality Date  . CESAREAN SECTION     x3  . CHOLECYSTECTOMY    . COLONOSCOPY    . LITHOTRIPSY    . THYROIDECTOMY     1997     OB History    Gravida  2   Para      Term      Preterm      AB  2   Living  2     SAB  2   TAB      Ectopic      Multiple      Live Births               Home Medications    Prior to Admission medications   Medication Sig Start Date End Date  Taking? Authorizing Provider  Albuterol Sulfate (PROAIR RESPICLICK) 867 (90 Base) MCG/ACT AEPB Inhale 1 puff into the lungs every 4 (four) hours as needed. 05/23/18  Yes [name], MD  CRESTOR 10 MG tablet Take 10 mg by mouth daily.  08/12/14  Yes [provider]  fluticasone furoate-vilanterol (BREO ELLIPTA) 100-25 MCG/INH AEPB Inhale 1 puff into the lungs daily. 10/30/18  Yes Brand Males, MD  folic acid (FOLVITE) 1 MG tablet Take 1 mg by mouth daily.   Yes [provider]  HUMIRA PEN 40 MG/0.4ML PNKT every 14 (fourteen) days.  10/23/18  Yes [provider]  levothyroxine (SYNTHROID, LEVOTHROID) 112 MCG tablet Take 112 mcg by mouth daily before breakfast.   Yes [provider]  Multiple Vitamin (MULTIVITAMIN WITH MINERALS) TABS tablet Take 1 tablet by mouth daily.   Yes [provider]  Multiple Vitamins-Minerals (GNP HAIR/SKIN/NAILS PO) Take by mouth.   Yes [provider]  vitamin C (ASCORBIC ACID) 500 MG tablet Take 500 mg by mouth  daily.   Yes [provider]  Vitamin D, Ergocalciferol, (DRISDOL) 1.25 MG (50000 UT) CAPS capsule Take 50,000 Units by mouth every 7 (seven) days.   Yes [provider]  azelastine (ASTELIN) 0.1 % nasal spray Place 2 sprays into both nostrils 2 (two) times daily. Use in each nostril as directed Patient not taking: Reported on 11/04/2018 10/09/18   [name], MD  guaiFENesin-codeine 100-10 MG/5ML syrup Take 5 mLs by mouth 3 (three) times daily as needed for cough. Patient not taking: Reported on 03/03/2019 11/07/18   [name], MD  valACYclovir (VALTREX) 500 MG tablet Take 1 tablet (500 mg total) by mouth 2 (two) times daily. Patient not taking: Reported on 03/03/2019 11/04/18   [name], MD    Family History Family History  Problem Relation Age of Onset  . Cervical cancer Paternal Aunt   . Heart disease Paternal Grandmother   . Heart disease Mother   . Dementia Mother    . Thyroid disease Mother   . Osteoarthritis Mother   . Asthma Mother   . Heart attack Father   . Breast cancer Cousin     Social History Social History   Tobacco Use  . Smoking status: Never Smoker  . Smokeless tobacco: Never Used  Substance Use Topics  . Alcohol use: No  . Drug use: No     Allergies   Almond oil, Lialda [mesalamine], Other, Aspirin, Hydromorphone hcl, Penicillins, Acetaminophen, Apriso [mesalamine er], Corticosteroids, Imitrex [sumatriptan], Stadol [butorphanol], Topamax [topiramate], Vancomycin, Azathioprine, Pepcid [famotidine], and Prednisone   Review of Systems Review of Systems  All other systems reviewed and are negative.    Physical Exam Updated Vital Signs BP (!) 141/89   Pulse 94   Resp 18   LMP 03/03/2013   SpO2 97%   Physical Exam Vitals signs and nursing note reviewed.  Constitutional:      General: She is not in acute distress.    Appearance: She is well-developed.  HENT:     Head: Normocephalic and atraumatic.     Right Ear: External ear normal.     Left Ear: External ear normal.  Eyes:     General: No scleral icterus.       Right eye: No discharge.        Left eye: No discharge.     Conjunctiva/sclera: Conjunctivae normal.  Neck:     Musculoskeletal: Neck supple.     Trachea: No tracheal deviation.  Cardiovascular:     Rate and Rhythm: Normal rate and regular rhythm.  Pulmonary:     Effort: Pulmonary effort is normal. No respiratory distress.     Breath sounds: Normal breath sounds. No stridor. No wheezing or rales.  Abdominal:     General: Bowel sounds are normal. There is no distension.     Palpations: Abdomen is soft.     Tenderness: There is no abdominal tenderness. There is no guarding or rebound.  Musculoskeletal:        General: No tenderness.  Skin:    General: Skin is warm and dry.     Findings: No rash.  Neurological:     Mental Status: She is alert and oriented to person, place, and time.     Cranial  Nerves: No cranial nerve deficit (no facial droop, extraocular movements intact, no slurred speech).     Sensory: No sensory deficit.     Motor: No abnormal muscle tone or seizure activity.     Coordination:  Coordination normal.     Comments: No pronator drift bilateral upper extrem, able to hold both legs off bed for 5 seconds, sensation intact in all extremities, no visual field cuts, no left or right sided neglect, normal finger-nose exam bilaterally, no nystagmus noted       ED Treatments / Results  Labs (all labs ordered are listed, but only abnormal results are displayed) Labs Reviewed  BASIC METABOLIC PANEL - Abnormal; Notable for the following components:      Result Value   Glucose, Bld 105 (*)    All other components within normal limits  CBC    EKG None  Radiology Ct Head Wo Contrast  Result Date: 03/03/2019 CLINICAL DATA:  Migraine headaches EXAM: CT HEAD WITHOUT CONTRAST TECHNIQUE: Contiguous axial images were obtained from the base of the skull through the vertex without intravenous contrast. COMPARISON:  04/23/2014 FINDINGS: Brain: No evidence of acute infarction, hemorrhage, hydrocephalus, extra-axial collection or mass lesion/mass effect. Vascular: No hyperdense vessel or unexpected calcification. Skull: Normal. Negative for fracture or focal lesion. Sinuses/Orbits: No acute finding. Other: None. IMPRESSION: Normal head CT Electronically Signed   By: Inez Catalina M.D.   On: 03/03/2019 17:23    Procedures Procedures (including critical care time)  Medications Ordered in ED Medications  prochlorperazine (COMPAZINE) injection 10 mg (10 mg Intravenous Given 03/03/19 1634)  diphenhydrAMINE (BENADRYL) injection 12.5 mg (12.5 mg Intravenous Given 03/03/19 1634)     Initial Impression / Assessment and Plan / ED Course  I have reviewed the triage vital signs and the nursing notes.  Pertinent labs & imaging results that were available during my care of the patient were  reviewed by me and considered in my medical decision making (see chart for details).  Clinical Course as of Mar 02 1830  Tue Mar 03, 2019  1830 Labs and CT scan reviewed.  No acute finding.  Patient's states she is feeling better and all her symptoms have resolved.   [JK]    Clinical Course User Index [JK] Dorie Rank, MD     No acute findings on head CT and laboratory test.  Neurologic exam is reassuring.  I have low suspicion for stroke or TIA.  I suspect complex migraine.  I discussed with the patient the limitations of head CT and certainly cannot completely exclude stroke based on normal head CT.  I do not feel that she needs an MRI at this time but strongly cautioned the patient return immediately if she has any recurrent symptoms  Final Clinical Impressions(s) / ED Diagnoses   Final diagnoses:  Other migraine without status migrainosus, not intractable    ED Discharge Orders    None       Dorie Rank, MD 03/03/19 1831

## 2019-03-03 NOTE — ED Notes (Signed)
An After Visit Summary was printed and given to the patient. Discharge instructions given and no further questions at this time.  

## 2019-03-05 ENCOUNTER — Encounter: Payer: Self-pay | Admitting: Family Medicine

## 2019-03-05 ENCOUNTER — Ambulatory Visit (INDEPENDENT_AMBULATORY_CARE_PROVIDER_SITE_OTHER): Payer: BC Managed Care – PPO | Admitting: Family Medicine

## 2019-03-05 DIAGNOSIS — G43909 Migraine, unspecified, not intractable, without status migrainosus: Secondary | ICD-10-CM

## 2019-03-05 NOTE — Progress Notes (Signed)
° ° °  Chief Complaint:  Danielle Bond is a 58 y.o. female who presents today for a virtual office visit with a chief complaint of migraine follow up.   Assessment/Plan:  Migraines Symptoms have resolved.  She has not had a severe migraine for the past 8 years.  Discussed potential treatment options.  Patient does not want to start prophylactic medication at this time.  Discussed importance of good oral hydration.  She will also continue her magnesium supplement.  Recommended riboflavin supplementation as well.  Discussed reasons to return to care.  If continues to have frequent, debilitating migraines, would consider addition of prophylactic medication such as amitriptyline or beta-blocker like she was on previously.  Stress Discussed stress management techniques with patient.  Also discussed pharmacotherapy versus psychotherapy.  She does not wish to pursue either these options this point.  She has a good support system at her church and her family.  She will be talking to her family members which seems to be helping.  Discussed reasons to return to care. Follow up as needed.     Subjective:  HPI:  Patient was seen in the ED 2 days ago with headache, lightheadedness, dizziness, difficulty moving her arm, weakness.  Had migraine cocktail and head CT.  Head CT was negative and symptoms improved migraine cocktail.  Patient was diagnosed with complex migraine and discharged home.  However last couple days she has had mild headache however symptoms are much improved.  She has restarted her magnesium supplement however is not been able to find any vitamin B12.  She has a history of migraines however is not had a severe migraine for about 8 years.  She has previously been treated with beta-blocker which worked well however has not been on that for about a years.  She is also tried triptan's in the past however had allergic reaction to this and is not currently taking that.  She also notes that she has been  under increased stress recently.  This is mostly due to the COVID-19 pandemic and her daughter going to college.  Patient also has some anxiety around this time year as this was the time when her father suffered cardiac arrest last year.  Thankfully he did not have any long-term consequences of cardiac arrest and is now in good health however this is still anxiety provoking time of year for her she has been talking with her pastor which has helped with stress levels.  ROS: Per HPI  PMH: She reports that she has never smoked. She has never used smokeless tobacco. She reports that she does not drink alcohol or use drugs.      Objective/Observations  Physical Exam: Gen: NAD, resting comfortably Pulm: Normal work of breathing Neuro: Grossly normal, moves all extremities Psych: Normal affect and thought content  Virtual Visit via Video   I connected with Danielle Bond on 03/05/19 at  3:00 PM EDT by a video enabled telemedicine application and verified that I am speaking with the correct person using two identifiers. I discussed the limitations of evaluation and management by telemedicine and the availability of in person appointments. The patient expressed understanding and agreed to proceed.   Patient location: Home Provider location: [place] participating in the virtual visit: Myself and Patient     Danielle Bond. Danielle Pain, MD 03/05/2019 3:13 PM

## 2019-03-05 NOTE — Assessment & Plan Note (Signed)
Symptoms have resolved.  She has not had a severe migraine for the past 8 years.  Discussed potential treatment options.  Patient does not want to start prophylactic medication at this time.  Discussed importance of good oral hydration.  She will also continue her magnesium supplement.  Recommended riboflavin supplementation as well.  Discussed reasons to return to care.  If continues to have frequent, debilitating migraines, would consider addition of prophylactic medication such as amitriptyline or beta-blocker like she was on previously.

## 2019-03-09 ENCOUNTER — Ambulatory Visit (INDEPENDENT_AMBULATORY_CARE_PROVIDER_SITE_OTHER): Payer: BC Managed Care – PPO | Admitting: Orthopedic Surgery

## 2019-03-09 ENCOUNTER — Encounter: Payer: Self-pay | Admitting: Orthopedic Surgery

## 2019-03-09 ENCOUNTER — Other Ambulatory Visit: Payer: Self-pay

## 2019-03-09 VITALS — Ht 60.0 in | Wt 149.0 lb

## 2019-03-09 DIAGNOSIS — M67431 Ganglion, right wrist: Secondary | ICD-10-CM | POA: Diagnosis not present

## 2019-03-09 DIAGNOSIS — G8929 Other chronic pain: Secondary | ICD-10-CM

## 2019-03-09 DIAGNOSIS — M25511 Pain in right shoulder: Secondary | ICD-10-CM | POA: Diagnosis not present

## 2019-03-10 ENCOUNTER — Encounter: Payer: Self-pay | Admitting: Orthopedic Surgery

## 2019-03-10 DIAGNOSIS — M25511 Pain in right shoulder: Secondary | ICD-10-CM | POA: Diagnosis not present

## 2019-03-10 DIAGNOSIS — G8929 Other chronic pain: Secondary | ICD-10-CM | POA: Diagnosis not present

## 2019-03-10 MED ORDER — METHYLPREDNISOLONE ACETATE 40 MG/ML IJ SUSP
40.0000 mg | INTRAMUSCULAR | Status: AC | PRN
  Administered 2019-03-10: 11:00:00 40 mg via INTRA_ARTICULAR

## 2019-03-10 MED ORDER — LIDOCAINE HCL 1 % IJ SOLN
5.0000 mL | INTRAMUSCULAR | Status: AC | PRN
  Administered 2019-03-10: 5 mL

## 2019-03-10 NOTE — Progress Notes (Signed)
Office Visit Note   Patient: Danielle Bond           Date of Birth: 04-20-1961           MRN: [id number] Visit Date: 03/09/2019              Requested by: [name], MD [address] [place],  [place] [id number] PCP: [name], MD  Chief Complaint  Patient presents with  . Right Shoulder - Pain, Follow-up      HPI: Patient is a 58 year old woman who presents complaining of increasing pain over the past month of the right shoulder.  She states she has had good relief with previous injections.  Patient states she is currently on Humira for ulcerative colitis.  Patient also complains of a soft mobile mass over the dorsal radial aspect of the right wrist.  Assessment & Plan: Visit Diagnoses:  1. Chronic right shoulder pain   2. Ganglion cyst of dorsum of right wrist     Plan: Recommended observation for the ganglion cyst right wrist the right shoulder was injected follow-up as needed.  Follow-Up Instructions: Return if symptoms worsen or fail to improve.   Ortho Exam  Patient is alert, oriented, no adenopathy, well-dressed, normal affect, normal respiratory effort. Examination the right shoulder she has pain with Neer and Hawkins impingement test pain with a drop arm test.  The [place] joint is nontender to palpation.  Semination the dorsum of the right wrist she has a soft mobile ganglion cyst over the dorsal radial aspect of the right wrist there is no skin color or temperature changes the cystic masses consistent with a ganglion cyst.  Imaging: No results found. No images are attached to the encounter.  Labs: Lab Results  Component Value Date   ESRSEDRATE 50 (H) 06/26/2018   ESRSEDRATE 25 (H) 03/16/2014   CRP 85.3 (H) 06/26/2018   REPTSTATUS 03/12/2014 FINAL 03/10/2014   CULT  03/10/2014    Multiple bacterial morphotypes present, none predominant. Suggest appropriate recollection if clinically indicated. Performed at [facility]  03/22/2014     Lab Results  Component Value Date   ALBUMIN 3.9 06/26/2018   ALBUMIN 4.1 05/23/2018   ALBUMIN 4.6 03/18/2018    Lab Results  Component Value Date   MG 1.6 08/15/2012   No results found for: VD25OH  No results found for: PREALBUMIN CBC EXTENDED Latest Ref Rng & Units 03/03/2019 06/26/2018 06/26/2018  WBC 4.0 - 10.5 K/uL 6.0 8.1 9.5  RBC 3.87 - 5.11 MIL/uL 4.67 3.93 3.99  HGB 12.0 - 15.0 g/dL 13.2 11.3(L) 11.7(L)  HCT 36.0 - 46.0 % 41.9 34.0(L) 37.5  PLT 150 - 400 K/uL 281 339 319  NEUTROABS 1,500 - 7,800 cells/uL - 3,337 4.5  LYMPHSABS 850 - 3,900 cells/uL - 3,054 3.4     Body mass index is 29.1 kg/m.  Orders:  Orders Placed This Encounter  Procedures  . Large Joint Inj   No orders of the defined types were placed in this encounter.    Procedures: Large Joint Inj: R subacromial bursa on 03/10/2019 11:20 AM Indications: diagnostic evaluation and pain Details: 22 G 1.5 in needle, posterior approach  Arthrogram: No  Medications: 5 mL lidocaine 1 %; 40 mg methylPREDNISolone acetate 40 MG/ML Outcome: tolerated well, no immediate complications Procedure, treatment alternatives, risks and benefits explained, specific risks discussed. Consent was given by the patient. Immediately prior to procedure a time out was called to verify  the correct patient, procedure, equipment, support staff and site/side marked as required. Patient was prepped and draped in the usual sterile fashion.      Clinical Data: No additional findings.  ROS:  All other systems negative, except as noted in the HPI. Review of Systems  Objective: Vital Signs: Ht 5' (1.524 m)   Wt 149 lb (67.6 kg)   LMP 03/03/2013   BMI 29.10 kg/m   Specialty Comments:  No specialty comments available.  PMFS History: Patient Active Problem List   Diagnosis Date Noted  . HSV-2 (herpes simplex virus 2) infection 11/04/2018  . Dyslipidemia 08/15/2018  . Hypothyroidism s/p total thyroidectomy  1997 08/15/2018  . Autoimmune hepatitis ([place]) 08/16/2012  . Asthma 03/31/2012  . Ulcerative colitis ([place]) 03/31/2012  . History of renal calculi 03/31/2012  . Migraines 03/31/2012  . Mitral valve prolapse    Past Medical History:  Diagnosis Date  . Asthma   . Chronic kidney disease   . Hx of pancreatitis 06/16/2018  . Kidney stone   . Kidney stone   . Migraine   . Orthostatic hypotension   . Osteopenia 03/31/2012  . Ulcerative colitis ([place]) 03/31/2012    Family History  Problem Relation Age of Onset  . Cervical cancer Paternal Aunt   . Heart disease Paternal Grandmother   . Heart disease Mother   . Dementia Mother   . Thyroid disease Mother   . Osteoarthritis Mother   . Asthma Mother   . Heart attack Father   . Breast cancer Cousin     Past Surgical History:  Procedure Laterality Date  . CESAREAN SECTION     x3  . CHOLECYSTECTOMY    . COLONOSCOPY    . LITHOTRIPSY    . THYROIDECTOMY     1997   Social History   Occupational History  . Occupation: Self-employed  Tobacco Use  . Smoking status: Never Smoker  . Smokeless tobacco: Never Used  Substance and Sexual Activity  . Alcohol use: No  . Drug use: No  . Sexual activity: Yes

## 2019-03-16 DIAGNOSIS — N6320 Unspecified lump in the left breast, unspecified quadrant: Secondary | ICD-10-CM | POA: Diagnosis not present

## 2019-03-19 DIAGNOSIS — Z8719 Personal history of other diseases of the digestive system: Secondary | ICD-10-CM | POA: Diagnosis not present

## 2019-03-26 DIAGNOSIS — N6320 Unspecified lump in the left breast, unspecified quadrant: Secondary | ICD-10-CM | POA: Diagnosis not present

## 2019-04-02 DIAGNOSIS — K5190 Ulcerative colitis, unspecified, without complications: Secondary | ICD-10-CM | POA: Diagnosis not present

## 2019-04-02 DIAGNOSIS — M79643 Pain in unspecified hand: Secondary | ICD-10-CM | POA: Diagnosis not present

## 2019-04-02 DIAGNOSIS — M7531 Calcific tendinitis of right shoulder: Secondary | ICD-10-CM | POA: Diagnosis not present

## 2019-04-02 DIAGNOSIS — M2550 Pain in unspecified joint: Secondary | ICD-10-CM | POA: Diagnosis not present

## 2019-04-02 DIAGNOSIS — M79644 Pain in right finger(s): Secondary | ICD-10-CM | POA: Diagnosis not present

## 2019-04-09 ENCOUNTER — Ambulatory Visit (INDEPENDENT_AMBULATORY_CARE_PROVIDER_SITE_OTHER): Payer: BC Managed Care – PPO | Admitting: Internal Medicine

## 2019-04-09 ENCOUNTER — Other Ambulatory Visit: Payer: Self-pay

## 2019-04-09 DIAGNOSIS — J209 Acute bronchitis, unspecified: Secondary | ICD-10-CM | POA: Diagnosis not present

## 2019-04-09 NOTE — Patient Instructions (Signed)
ICD-10-CM   1. Acute bronchitis, unspecified organism  J20.9     Expectant approach avoiding exposure per mutual discussion Call back if worse or other symptoms

## 2019-04-09 NOTE — Progress Notes (Addendum)
Patient ID: Danielle Bond, female    DOB: January 10, 1961, 58 y.o.   MRN: [id number]  HPI   PCP Jani Gravel, MD  Neurologist-Dr. Krista Blue Infectious diseases Dr. Michel Bickers Orthopedic for right shoulder-Dr. Hildred Priest 06/18/2018  Chief Complaint  Patient presents with   Pulmonary Consult    asthma, [name], NP referred her, she went to the ER and she had bronchitis with fever, she has constant fever and she has seen Infectious DIsease for this, she has a dry hacking cough    58 year old female with a chronic complex set of medical problems that includes hyperlipidemia, Graves' disease, status post thyroidectomy, ulcerative colitis, kidney stone and history of acute pancreatitis.  She also has long history of migraine headaches.  She presents with her husband for respiratory issues in the context of fever of unknown origin and bloody stools  She tells me that she was diagnosed with ulcerative colitis in [place] in 2007 when the used to live [place].  In 2013 she migrated to [place], [place] and had a bad flareup of ulcerative colitis for which she was admitted.  Since then she has been under the care of Dr. Collene Mares gastroenterologist and she has been on Lialda.  Overall she is doing really wel0 .then some few years ago she was started on Brio for diagnosis of asthma.  Details of this is unclear.  Overall doing well since then till September 2019 when she felt the Lialda stopped working on a subjective basis.  This is because since then every time she takes the Lialda she has abdominal pain.  She also has fever at night on a daily basis.  She also has bloody stools at least 7 times a day.  She has abdominal pain when she takes the [place].  Per her report Dr. Collene Mares has done a colonoscopy with biopsy that showed inflammation.  Therefore biologic is being considered.  However she is also increasing fatigue and shortness of breath associated with it.  Is mostly present on exertion  relieved by rest.  Is also wheezing.  Overall she has had a significant deterioration.  Then towards the end of October 2019 she felt she had an asthma flareup and reported to the urgent care center where she was treated acutely.  But she represented within a week or 10 days to the ER and was given a diagnosis of acute bronchitis.  CT angiogram ruled out pulmonary embolism.  All these records have been reviewed by me and the images and results visualized.  She subsequently has seen Dr. Michel Bickers infectious diseases this week.  I reviewed his findings.  He is worried that the Lialda is causing eosinophilia.  He does not suspect infectious disease as a reason for the fever quantified on gold and HIV are negative.  Further blood results are pending.  Patient says she has had an extensive work-up.  She is also seen [facility] rheumatology department Dr. Joellyn Rued who apparently has done an extensive autoimmune panel and this is all negative.  Unclear is if vasculitis work-up has been done that includes ANCA and GBM.  Patient is really frustrated by the whole constellation of symptoms.  At this point in time a biologic agent is being considered for ulcerative colitis but apparently the fever is being considered is unusual in the setting of ulcerative colitis and therefore she is going through the work-up  Exam nitric oxide is 21 ppb and this is on [employer]  today and is normal.  Recent labs June 12, 2018 done at [facility] rheumatology: Sjogren antibody centromere antibody, Jo 1 antibody, chromatin antibody, SSA, SSB, anti-scleroderma antibody, Smith antibody, double-stranded DNA antibody and RNP antibody and ESR all normal.  TSH is normal.  Hemoglobin is 12 g%.  Your sniffles continue to be high at 13.6% of her total white count of 7400.  Creatinine is normal.  Urine analysis appears normal.  Results for LOVEY, CRUPI (MRN [id number]) as of 06/18/2018 09:18  Ref. Range  11/27/2013 12:40 03/10/2014 18:52 03/16/2014 16:42 05/23/2018 05:31 06/16/2018 15:10  Eosinophils Absolute Latest Ref Range: 15 - 500 cells/uL 0.2 0.4 0.2 0.6 (H) 385   Results for MILLISSA, DEESE (MRN [id number]) as of 06/18/2018 09:18  Ref. Range 10/05/2013 14:36 03/16/2014 16:42  Anti Nuclear Antibody(ANA) Latest Ref Range: NEGATIVE  POS (A)   ANA Pattern 1 Unknown HOMOGENOUS (A)   ANA Titer 1 Latest Ref Range: <1:40   1:1280 (H)   ds DNA Ab Latest Units: IU/mL  1  C3 Complement Latest Ref Range: 90 - 180 mg/dL  116  C4 Complement Latest Ref Range: 10 - 40 mg/dL  18   Most recent CT scan of the chest angiogram May 23, 2018: Personally visualized.  Lung fields look fairly benign.  OV 07/29/2018  Subjective:  Patient ID: Danielle Bond, female , DOB: May 07, 1961 , age 58 y.o. , MRN: [id number] , ADDRESS: [address] [place] [id number]   07/29/2018 -   Chief Complaint  Patient presents with   Follow-up    Pt states she is doing better since last visit. States she still has an occ dry cough but nothing like last visit. Pt states she does have some SOB/chest tightness today due to the weather.     HPI Danielle Bond 58 y.o. -presents with her husband for chronic cough and fever of unknown origin in the setting of ulcerative colitis and Lialda treatment  After her last visit we reviewed autoimmune panel from primary rheumatologist and this was negative.  We performed vasculitis panel and this was negative.  In the interim she is seen infectious disease specialist Dr. Megan Salon and apparently no infection present for her fever of unknown origin.  Discussions were held with Dr. Collene Mares.  She also discussed with a gastroenterologist at [facility] and the thought process was that maybe the fevers are indeed coming from ulcerative colitis although this is very rare.  This is because autoimmune, vasculitis and infectious work-up was negative.  On July 01, 2018 patient's Lialda was  stopped and she has been prescribed budesonide oral treatment.  Subsequently after this within a few weeks her cough is significantly improved.  She not having any more night sweats other than one at night.  This is 80% better.  And she is also feeling a lot better.  Apparently starting Entyvio biologic for ulcerative colitis is in discussion but she is somewhat got trepidation about this given the fact it is an infusion.  She will meet with Dr. Collene Mares about this.  Overall at this point she is feeling well no respiratory complaints other than the mild cough.  She is asking for referral to primary care physician.  Her current primary care physician is apparently out of the office for an extended period of time not otherwise specified.   OV 04/09/2019  Subjective:  Patient ID: Danielle Bond, female , DOB: [birth year] , age 4 y.o. , MRN: [id number] , ADDRESS: [place] [place]  [address] [place] [id number]   04/09/2019 -  chronic cough and fever of unknown origin in the setting of ulcerative colitis and Lialda treatment    HPI Danielle Bond 58 y.o. - cough was resolved. Now back 1.5 weeks ago. Dry cough. Hacky cough. Sometimes she does bring sputum. Annoying. Has on and off migraine headaches. No fever, No hemoptysis . No anosmia. No ageusia. No covid exposures but daughter had strep throat 2 weeks ago (confirmed strep with confirmed negative covid). No sore throat. Throat does feel itchy.  Does NOT want to do antibiotics. Doing low risk covid activities only  Re UC - doing Humira by Dr Therisa Doyne since October 01, 2018 - q 2 weeks. .  Fevers went away in dec 2019. UC doing ok    Limitations of telephone visit explained  ROS - per HPI     has a past medical history of Asthma, Chronic kidney disease, pancreatitis (06/16/2018), Kidney stone, Kidney stone, Migraine, Orthostatic hypotension, Osteopenia (03/31/2012), and Ulcerative colitis ([place]) (03/31/2012).   reports that she has never smoked. She has never used  smokeless tobacco.  Past Surgical History:  Procedure Laterality Date   CESAREAN SECTION     x3   CHOLECYSTECTOMY     COLONOSCOPY     LITHOTRIPSY     THYROIDECTOMY     1997    Allergies  Allergen Reactions   Almond Oil Hives and Shortness Of Breath   Lialda [Mesalamine] Other (See Comments)    Vomiting and rectal bleeding   Other Anaphylaxis    Almonds, tree nuts   Aspirin Hives and Nausea Only    Stomach cramps   Hydromorphone Hcl Other (See Comments)    Caused Enzyme levels to go up   Penicillins Hives and Swelling    Has patient had a PCN reaction causing immediate rash, facial/tongue/throat swelling, SOB or lightheadedness with hypotension: Yes Has patient had a PCN reaction causing severe rash involving mucus membranes or skin necrosis: No Has patient had a PCN reaction that required hospitalization: No Has patient had a PCN reaction occurring within the last 10 years: No  Stomach cramps If all of the above answers are "NO", then may proceed with Cephalosporin use.    Acetaminophen Other (See Comments)    Has autoimmune hepatitis, wants to avoid APAP   Apriso [Mesalamine Er] Nausea And Vomiting   Corticosteroids Other (See Comments)    Had acute pancreatitis x 2 after steroid use.   Imitrex [Sumatriptan] Swelling    Injection caused swelling   Stadol [Butorphanol] Other (See Comments)    hallucinates   Topamax [Topiramate] Other (See Comments)    Kidney stones   Vancomycin Hives   Azathioprine Rash   Pepcid [Famotidine] Nausea And Vomiting    Makes acid worse   Prednisone Nausea And Vomiting    All steroids causes nausea and pancreatitis     Immunization History  Administered Date(s) Administered   Influenza Inj Mdck Quad Pf 03/23/2018   Influenza Split 04/10/2012, 04/24/2013   Influenza, Seasonal, Injecte, Preservative Fre 04/15/2014   PPD Test 10/05/2013   Pneumococcal Polysaccharide-23 04/22/2008, 04/23/2013   Tdap  07/23/2004, 07/22/2012   Varicella 07/22/2012    Family History  Problem Relation Age of Onset   Cervical cancer Paternal Aunt    Heart disease Paternal Grandmother    Heart disease Mother    Dementia Mother    Thyroid disease Mother    Osteoarthritis Mother    Asthma Mother    Heart  attack Father    Breast cancer Cousin      Current Outpatient Medications:    Albuterol Sulfate (PROAIR RESPICLICK) 081 (90 Base) MCG/ACT AEPB, Inhale 1 puff into the lungs every 4 (four) hours as needed., Disp: 1 each, Rfl: 0   CRESTOR 10 MG tablet, Take 10 mg by mouth daily. , Disp: , Rfl: 1   fluticasone furoate-vilanterol (BREO ELLIPTA) 100-25 MCG/INH AEPB, Inhale 1 puff into the lungs daily., Disp: 60 each, Rfl: 5   folic acid (FOLVITE) 1 MG tablet, Take 1 mg by mouth daily., Disp: , Rfl:    HUMIRA PEN 40 MG/0.4ML PNKT, every 14 (fourteen) days. , Disp: , Rfl:    levothyroxine (SYNTHROID, LEVOTHROID) 112 MCG tablet, Take 112 mcg by mouth daily before breakfast., Disp: , Rfl:    Multiple Vitamin (MULTIVITAMIN WITH MINERALS) TABS tablet, Take 1 tablet by mouth daily., Disp: , Rfl:    Multiple Vitamins-Minerals (GNP HAIR/SKIN/NAILS PO), Take by mouth., Disp: , Rfl:    vitamin C (ASCORBIC ACID) 500 MG tablet, Take 500 mg by mouth daily., Disp: , Rfl:    Vitamin D, Ergocalciferol, (DRISDOL) 1.25 MG (50000 UT) CAPS capsule, Take 50,000 Units by mouth every 7 (seven) days., Disp: , Rfl:       Objective:   There were no vitals filed for this visit.  Estimated body mass index is 29.1 kg/m as calculated from the following:   Height as of 03/09/19: 5' (1.524 m).   Weight as of 03/09/19: 149 lb (67.6 kg).  _0 @  There were no vitals filed for this visit.   Physical Exam telelpjone visit       Assessment:       ICD-10-CM   1. Acute bronchitis, unspecified organism  J20.9        Plan:     Patient Instructions     ICD-10-CM   1. Acute bronchitis,  unspecified organism  J20.9     Expectant approach avoiding exposure per mutual discussion Call back if worse or other symptoms  (> 50% of this 10 min visit spent in face to face counseling or/and coordination of care by this undersigned MD - Dr Brand Males. This includes one or more of the following documented above: discussion of test results, diagnostic or treatment recommendations, prognosis, risks and benefits of management options, instructions, education, compliance or risk-factor reduction)    SIGNATURE    Dr. Brand Males, M.D., F.C.C.P,  Pulmonary and Critical Care Medicine Staff Physician, Commerce Director - Interstitial Lung Disease  Program  Pulmonary [place] [facility] [place], [place], [address]  Pager: [phone], If no answer or between  15:00h - 7:00h: call [phone] Telephone: [phone]  9:15 AM 04/09/2019

## 2019-04-10 ENCOUNTER — Telehealth: Payer: Self-pay | Admitting: Internal Medicine

## 2019-04-10 ENCOUNTER — Other Ambulatory Visit: Payer: Self-pay

## 2019-04-10 DIAGNOSIS — Z20822 Contact with and (suspected) exposure to covid-19: Secondary | ICD-10-CM

## 2019-04-10 DIAGNOSIS — R6889 Other general symptoms and signs: Secondary | ICD-10-CM | POA: Diagnosis not present

## 2019-04-10 NOTE — Telephone Encounter (Signed)
Primary Pulmonologist: Ramaswamy Last office visit and with whom: Televisit MR 04/09/19 What do we see them for (pulmonary problems): cough and acute bronchitis Last OV assessment/plan:  "   Patient Instructions     ICD-10-CM   1. Acute bronchitis, unspecified organism  J20.9     Expectant approach avoiding exposure per mutual discussion Call back if worse or other symptoms  (> 50% of this 10 min visit spent in face to face counseling or/and coordination of care by this undersigned MD - Dr Brand Males. This includes one or more of the following documented above: discussion of test results, diagnostic or treatment recommendations, prognosis, risks and benefits of management options, instructions, education, compliance or risk-factor reduction)     Was appointment offered to patient (explain)?  Just had one yesterday   Reason for call: Patient states she is not better. She has her every year dry hacking cough, but last night she was unable to sleep due to her feeling like someone was squeezing the air out of her on her left side.  She cannot get a full good breath in.  She states she did her inhaler last night but then couldn't sleep all night.  She also states finally woke up in a pile of sweat. Patient took her temp this morning it was 97.3 at 0800.   MR please advise  (examples of things to ask: : When did symptoms start? Fever? Cough? Productive? Color to sputum? More sputum than usual? Wheezing? Have you needed increased oxygen? Are you taking your respiratory medications? What over the counter measures have you tried?)

## 2019-04-10 NOTE — Telephone Encounter (Signed)
She needs to go to covid-19 acute testing 04/10/2019  IF worse / concerned  At this point consider go to ER - I do not know what is going on esp being Friday and weekend coming up  Do not want to rx over phone given her immune suppression    Current Outpatient Medications:  .  Albuterol Sulfate (PROAIR RESPICLICK) 010 (90 Base) MCG/ACT AEPB, Inhale 1 puff into the lungs every 4 (four) hours as needed., Disp: 1 each, Rfl: 0 .  CRESTOR 10 MG tablet, Take 10 mg by mouth daily. , Disp: , Rfl: 1 .  fluticasone furoate-vilanterol (BREO ELLIPTA) 100-25 MCG/INH AEPB, Inhale 1 puff into the lungs daily., Disp: 60 each, Rfl: 5 .  folic acid (FOLVITE) 1 MG tablet, Take 1 mg by mouth daily., Disp: , Rfl:  .  HUMIRA PEN 40 MG/0.4ML PNKT, every 14 (fourteen) days. , Disp: , Rfl:  .  levothyroxine (SYNTHROID, LEVOTHROID) 112 MCG tablet, Take 112 mcg by mouth daily before breakfast., Disp: , Rfl:  .  Multiple Vitamin (MULTIVITAMIN WITH MINERALS) TABS tablet, Take 1 tablet by mouth daily., Disp: , Rfl:  .  Multiple Vitamins-Minerals (GNP HAIR/SKIN/NAILS PO), Take by mouth., Disp: , Rfl:  .  vitamin C (ASCORBIC ACID) 500 MG tablet, Take 500 mg by mouth daily., Disp: , Rfl:  .  Vitamin D, Ergocalciferol, (DRISDOL) 1.25 MG (50000 UT) CAPS capsule, Take 50,000 Units by mouth every 7 (seven) days., Disp: , Rfl:

## 2019-04-10 NOTE — Telephone Encounter (Signed)
Called spoke with patient, let her know MR's recs. , placed order for covid testing.   Patient is going to go to [facility] today   Nothing further needed at this time.   Told patient to go to emergency room if things get worse, patient voiced understanding.

## 2019-04-11 LAB — NOVEL CORONAVIRUS, NAA: SARS-CoV-2, NAA: NOT DETECTED

## 2019-04-12 ENCOUNTER — Telehealth: Payer: Self-pay | Admitting: Internal Medicine

## 2019-04-12 NOTE — Telephone Encounter (Signed)
[name] - covid test is negative. She can come in this week and be seen by a provider for her acoute symptoms if she still is symptomatic. I am in ICU rotation

## 2019-04-13 NOTE — Telephone Encounter (Signed)
Spoke with the pt and notified of recs per MR  Appt scheduled for 04/16/19 bc she prefers to only see MR

## 2019-04-16 ENCOUNTER — Ambulatory Visit (INDEPENDENT_AMBULATORY_CARE_PROVIDER_SITE_OTHER): Payer: BC Managed Care – PPO | Admitting: Internal Medicine

## 2019-04-16 ENCOUNTER — Encounter: Payer: Self-pay | Admitting: Internal Medicine

## 2019-04-16 ENCOUNTER — Other Ambulatory Visit: Payer: Self-pay

## 2019-04-16 VITALS — BP 122/90 | HR 99 | Temp 97.7°F | Ht 60.0 in | Wt 154.6 lb

## 2019-04-16 DIAGNOSIS — D849 Immunodeficiency, unspecified: Secondary | ICD-10-CM

## 2019-04-16 DIAGNOSIS — D721 Eosinophilia, unspecified: Secondary | ICD-10-CM

## 2019-04-16 DIAGNOSIS — D899 Disorder involving the immune mechanism, unspecified: Secondary | ICD-10-CM | POA: Diagnosis not present

## 2019-04-16 DIAGNOSIS — J209 Acute bronchitis, unspecified: Secondary | ICD-10-CM | POA: Diagnosis not present

## 2019-04-16 MED ORDER — PREDNISONE 10 MG PO TABS: ORAL_TABLET | ORAL | 0 refills | Status: DC

## 2019-04-16 MED ORDER — DOXYCYCLINE HYCLATE 100 MG PO TABS: ORAL_TABLET | ORAL | 0 refills | Status: DC

## 2019-04-16 NOTE — Patient Instructions (Signed)
ICD-10-CM   1. Acute bronchitis, unspecified organism  J20.9   2. Eosinophilia  D72.1   3. Immunosuppressed status ([place])  D89.9     ? Due to asthma flare ? Post viral reactive ? Other rare situations in context of Humira  Plan Check cbc with diff for esosinophil count Check Covid IgG serology (low prob this is covid-19) Do CXR 2 view  Take doxycycline 179m po twice daily x 5 days; take after meals and avoid sunlight Take prednisone 40 mg daily x 2 days, then 227mdaily x 2 days, then 1023maily x 2 days, then 5mg47mily x 2 days and stop  Followup Will call with results Flu shot in few weeks when better As needed in few weeks if not resolved but otherwise 6 months or sooner if needed

## 2019-04-16 NOTE — Progress Notes (Signed)
PCP Danielle Gravel, MD  Neurologist-Dr. Krista Bond Infectious diseases Dr. Michel Bond Orthopedic for right shoulder-Dr. Hildred Bond 06/18/2018  Chief Complaint  Patient presents with   Pulmonary Consult    asthma, [name], NP referred her, she went to the ER and she had bronchitis with fever, she has constant fever and she has seen Infectious DIsease for this, she has a dry hacking cough    58 year old female with a chronic complex set of medical problems that includes hyperlipidemia, Graves' disease, status post thyroidectomy, ulcerative colitis, kidney stone and history of acute pancreatitis.  She also has long history of migraine headaches.  She presents with her husband for respiratory issues in the context of fever of unknown origin and bloody stools  She tells me that she was diagnosed with ulcerative colitis in [place] in 2007 when the used to live [place].  In 2013 she migrated to [place], [place] and had a bad flareup of ulcerative colitis for which she was admitted.  Since then she has been under the care of Dr. Collene Bond gastroenterologist and she has been on Lialda.  Overall she is doing really wel0 .then some few years ago she was started on Brio for diagnosis of asthma.  Details of this is unclear.  Overall doing well since then till September 2019 when she felt the Lialda stopped working on a subjective basis.  This is because since then every time she takes the Lialda she has abdominal pain.  She also has fever at night on a daily basis.  She also has bloody stools at least 7 times a day.  She has abdominal pain when she takes the [place].  Per her report Dr. Collene Bond has done a colonoscopy with biopsy that showed inflammation.  Therefore biologic is being considered.  However she is also increasing fatigue and shortness of breath associated with it.  Is mostly present on exertion relieved by rest.  Is also wheezing.  Overall she has had a significant deterioration.   Then towards the end of October 2019 she felt she had an asthma flareup and reported to the urgent care center where she was treated acutely.  But she represented within a week or 10 days to the ER and was given a diagnosis of acute bronchitis.  CT angiogram ruled out pulmonary embolism.  All these records have been reviewed by me and the images and results visualized.  She subsequently has seen Dr. Michel Bond infectious diseases this week.  I reviewed his findings.  He is worried that the Lialda is causing eosinophilia.  He does not suspect infectious disease as a reason for the fever quantified on gold and HIV are negative.  Further blood results are pending.  Patient says she has had an extensive work-up.  She is also seen [facility] rheumatology department Dr. Joellyn Bond who apparently has done an extensive autoimmune panel and this is all negative.  Unclear is if vasculitis work-up has been done that includes ANCA and GBM.  Patient is really frustrated by the whole constellation of symptoms.  At this point in time a biologic agent is being considered for ulcerative colitis but apparently the fever is being considered is unusual in the setting of ulcerative colitis and therefore she is going through the work-up  Exam nitric oxide is 21 ppb and this is on Breo today and is normal.  Recent labs June 12, 2018 done at [facility] rheumatology: Sjogren antibody centromere antibody,  Jo 1 antibody, chromatin antibody, SSA, SSB, anti-scleroderma antibody, Smith antibody, double-stranded DNA antibody and RNP antibody and ESR all normal.  TSH is normal.  Hemoglobin is 12 g%.  Your sniffles continue to be high at 13.6% of her total white count of 7400.  Creatinine is normal.  Urine analysis appears normal.  Results for Danielle Bond (MRN [id number]) as of 06/18/2018 09:18  Ref. Range 11/27/2013 12:40 03/10/2014 18:52 03/16/2014 16:42 05/23/2018 05:31 06/16/2018 15:10    Eosinophils Absolute Latest Ref Range: 15 - 500 cells/uL 0.2 0.4 0.2 0.6 (H) 385   Results for Danielle Bond (MRN [id number]) as of 06/18/2018 09:18  Ref. Range 10/05/2013 14:36 03/16/2014 16:42  Anti Nuclear Antibody(ANA) Latest Ref Range: NEGATIVE  POS (A)   ANA Pattern 1 Unknown HOMOGENOUS (A)   ANA Titer 1 Latest Ref Range: <1:40   1:1280 (H)   ds DNA Ab Latest Units: IU/mL  1  C3 Complement Latest Ref Range: 90 - 180 mg/dL  116  C4 Complement Latest Ref Range: 10 - 40 mg/dL  18   Most recent CT scan of the chest angiogram May 23, 2018: Personally visualized.  Lung fields look fairly benign.  OV 07/29/2018  Subjective:  Patient ID: Danielle Bond, female , DOB: Mar 09, 1961 , age 58 y.o. , MRN: [id number] , ADDRESS: [address] [place] [address]   07/29/2018 -   Chief Complaint  Patient presents with   Follow-up    Pt states she is doing better since last visit. States she still has an occ dry cough but nothing like last visit. Pt states she does have some SOB/chest tightness today due to the weather.     HPI Danielle Bond 58 y.o. -presents with her husband for chronic cough and fever of unknown origin in the setting of ulcerative colitis and Lialda treatment  After her last visit we reviewed autoimmune panel from primary rheumatologist and this was negative.  We performed vasculitis panel and this was negative.  In the interim she is seen infectious disease specialist Dr. Megan Bond and apparently no infection present for her fever of unknown origin.  Discussions were held with Dr. Collene Bond.  She also discussed with a gastroenterologist at [facility] and the thought process was that maybe the fevers are indeed coming from ulcerative colitis although this is very rare.  This is because autoimmune, vasculitis and infectious work-up was negative.  On July 01, 2018 patient's Lialda was stopped and she has been prescribed budesonide oral treatment.  Subsequently  after this within a few weeks her cough is significantly improved.  She not having any more night sweats other than one at night.  This is 80% better.  And she is also feeling a lot better.  Apparently starting Entyvio biologic for ulcerative colitis is in discussion but she is somewhat got trepidation about this given the fact it is an infusion.  She will meet with Dr. Collene Bond about this.  Overall at this point she is feeling well no respiratory complaints other than the mild cough.  She is asking for referral to primary care physician.  Her current primary care physician is apparently out of the office for an extended period of time not otherwise specified.   OV 04/09/2019  Subjective:  Patient ID: Danielle Bond, female , DOB: 1960-08-05 , age 17 y.o. , MRN: [id number] , ADDRESS: [address] [place] [address]   04/09/2019 -  chronic cough and fever of unknown origin in the setting  of ulcerative colitis and Lialda treatment    HPI Danielle Bond 58 y.o. - cough was resolved. Now back 1.5 weeks ago. Dry cough. Hacky cough. Sometimes she does bring sputum. Annoying. Has on and off migraine headaches. No fever, No hemoptysis . No anosmia. No ageusia. No covid exposures but daughter had strep throat 2 weeks ago (confirmed strep with confirmed negative covid). No sore throat. Throat does feel itchy.  Does NOT want to do antibiotics. Doing low risk covid activities only  Re UC - doing Humira by Dr Therisa Doyne since October 01, 2018 - q 2 weeks. .  Fevers went away in dec 2019. UC doing ok  OV 04/16/2019  Subjective:  Patient ID: Danielle Bond, female , DOB: 11/23/1960 , age 33 y.o. , MRN: [id number] , ADDRESS: [address] [place] [address]   04/16/2019 -   Chief Complaint  Patient presents with   Follow-up    Pt c/o prod cough with yellow mucus x 2 weeks. Pt also c/o left sided discomfort on lateral chest with inspiration. Pt states due to the discomfort she feels she cant take a full  breath in. Pt denies f/c/s and swelling.    Acute visit  HPI Danielle Bond 58 y.o. -is here for acute visit.  Originally seen in  Nov2019 for fever of unknown origin.  At that time ulcerative colitis was suspected along with Lialda because she had some eosinophilia.  She has been on Breo.  She tells me that her fever and cough broke and it was unrelated to stopping Lialda.  She then switched gastroenterologist and has been on care with Dr. Therisa Doyne.  She is on Humira but she tells me the fever and cough stopped even before she went on Humira.  She had been doing well.  Approximately 3 weeks ago or so daughter developed strep throat [apparently COVID negative].  Then approximately 2 weeks ago she developed a cough.  We did do a telephone visit.  At that time she opted for expectant follow-up.  She is here acutely because the cough is persistent.  She also has some yellow sputum.  A few days ago she did feel like a muscle pull in the left infra-axillary area with the cough.  This is resolved.  Early this morning she had very mild symptoms of the same.  Therefore she is here but there is no wheezing or shortness of breath.  There is no pleuritic chest pain.  There is no hemoptysis no pedal edema.  She continues with the Brio.  We did do a COVID antigen testing/PCR testing and this was negative on 10 April 2019.  In May 2020 her COVID serology for IgG was negative.  She is interested in a chest x-ray and empiric treatment with antibiotics.  We are unable to do an exhaled nitric oxide test because of the national [place] emergency and local site restrictions.   COVID negative 04/10/2019 - negtaive Results for DEAUN, ROCHA (MRN [id number]) as of 04/16/2019 15:48  Ref. Range 12/17/2018 09:18  SARS CoV2 AB IGG Unknown NEGATIVE    Results for YESSICA, PUTNAM (MRN [id number]) as of 04/16/2019 15:48  Ref. Range 06/18/2018 10:17 06/26/2018 01:15 06/26/2018 14:29  Eosinophils Absolute Latest Ref Range: 15 - 500  cells/uL 0.9 (H) 0.4 535 (H)  Results for BOSTON, CATARINO (MRN [id number]) as of 04/16/2019 15:48  Ref. Range 06/18/2018 10:17  IgE (Immunoglobulin E), Serum Latest Ref Range: <OR=114 kU/L 74   Results for BEAUTIFUL, PENSYL (MRN [id number])  as of 04/16/2019 15:48  Ref. Range 08/15/2012 19:14 10/05/2013 14:36 03/16/2014 16:42 06/26/2018 14:29 07/02/2018 10:42  Anti Nuclear Antibody (ANA) Latest Ref Range: Negative   POS (A)   Negative  ANA Pattern 1 Unknown  HOMOGENOUS (A)     ANA Titer 1 Latest Ref Range: <1:40    1:1280 (H)     ds DNA Ab Latest Units: IU/mL   1    GBM Ab Latest Units: AI    <1.0 <1.0  Mitochondrial M2 Ab, IgG Latest Ref Range: <0.91  1.14 (H)      Myeloperoxidase Abs Latest Units: AI     <1.0  Serine Protease 3 Latest Units: AI     <1.0  S cerevisiae IgG ab Latest Ref Range: <=20.0 Units 12.7      S cerevisiae IgA ab Latest Ref Range: <=20.0 Units 18.3       ROS - per HPI     has a past medical history of Asthma, Chronic kidney disease, pancreatitis (06/16/2018), Kidney stone, Kidney stone, Migraine, Orthostatic hypotension, Osteopenia (03/31/2012), and Ulcerative colitis ([place]) (03/31/2012).   reports that she has never smoked. She has never used smokeless tobacco.  Past Surgical History:  Procedure Laterality Date   CESAREAN SECTION     x3   CHOLECYSTECTOMY     COLONOSCOPY     LITHOTRIPSY     THYROIDECTOMY     1997    Allergies  Allergen Reactions   Almond Oil Hives and Shortness Of Breath   Lialda [Mesalamine] Other (See Comments)    Vomiting and rectal bleeding   Other Anaphylaxis    Almonds, tree nuts   Aspirin Hives and Nausea Only    Stomach cramps   Hydromorphone Hcl Other (See Comments)    Caused Enzyme levels to go up   Penicillins Hives and Swelling    Has patient had a PCN reaction causing immediate rash, facial/tongue/throat swelling, SOB or lightheadedness with hypotension: Yes Has patient had a PCN reaction causing severe rash involving  mucus membranes or skin necrosis: No Has patient had a PCN reaction that required hospitalization: No Has patient had a PCN reaction occurring within the last 10 years: No  Stomach cramps If all of the above answers are "NO", then may proceed with Cephalosporin use.    Acetaminophen Other (See Comments)    Has autoimmune hepatitis, wants to avoid APAP   Apriso [Mesalamine Er] Nausea And Vomiting   Corticosteroids Other (See Comments)    Had acute pancreatitis x 2 after steroid use.   Imitrex [Sumatriptan] Swelling    Injection caused swelling   Stadol [Butorphanol] Other (See Comments)    hallucinates   Topamax [Topiramate] Other (See Comments)    Kidney stones   Vancomycin Hives   Azathioprine Rash   Pepcid [Famotidine] Nausea And Vomiting    Makes acid worse   Prednisone Nausea And Vomiting    All steroids causes nausea and pancreatitis     Immunization History  Administered Date(s) Administered   Influenza Inj Mdck Quad Pf 03/23/2018   Influenza Split 04/10/2012, 04/24/2013   Influenza, Seasonal, Injecte, Preservative Fre 04/15/2014   PPD Test 10/05/2013   Pneumococcal Polysaccharide-23 04/22/2008, 04/23/2013   Tdap 07/23/2004, 07/22/2012   Varicella 07/22/2012    Family History  Problem Relation Age of Onset   Cervical cancer Paternal Aunt    Heart disease Paternal Grandmother    Heart disease Mother    Dementia Mother    Thyroid disease Mother  Osteoarthritis Mother    Asthma Mother    Heart attack Father    Breast cancer Cousin      Current Outpatient Medications:    Albuterol Sulfate (PROAIR RESPICLICK) 765 (90 Base) MCG/ACT AEPB, Inhale 1 puff into the lungs every 4 (four) hours as needed., Disp: 1 each, Rfl: 0   CRESTOR 10 MG tablet, Take 10 mg by mouth daily. , Disp: , Rfl: 1   ELDERBERRY PO, Take 1 tablet by mouth daily., Disp: , Rfl:    fluticasone furoate-vilanterol (BREO ELLIPTA) 100-25 MCG/INH AEPB, Inhale 1 puff  into the lungs daily., Disp: 60 each, Rfl: 5   folic acid (FOLVITE) 1 MG tablet, Take 1 mg by mouth daily., Disp: , Rfl:    HUMIRA PEN 40 MG/0.4ML PNKT, every 14 (fourteen) days. , Disp: , Rfl:    levothyroxine (SYNTHROID, LEVOTHROID) 112 MCG tablet, Take 112 mcg by mouth daily before breakfast., Disp: , Rfl:    Multiple Vitamin (MULTIVITAMIN WITH MINERALS) TABS tablet, Take 1 tablet by mouth daily., Disp: , Rfl:    Multiple Vitamins-Minerals (GNP HAIR/SKIN/NAILS PO), Take by mouth., Disp: , Rfl:    vitamin C (ASCORBIC ACID) 500 MG tablet, Take 500 mg by mouth daily., Disp: , Rfl:    Vitamin D, Ergocalciferol, (DRISDOL) 1.25 MG (50000 UT) CAPS capsule, Take 50,000 Units by mouth every 7 (seven) days., Disp: , Rfl:       Objective:   Vitals:   04/16/19 1539  BP: 122/90  Pulse: 99  Temp: 97.7 F (36.5 C)  TempSrc: Skin  SpO2: 97%  Weight: 154 lb 9.6 oz (70.1 kg)  Height: 5' (1.524 m)    Estimated body mass index is 30.19 kg/m as calculated from the following:   Height as of this encounter: 5' (1.524 m).   Weight as of this encounter: 154 lb 9.6 oz (70.1 kg).  _0 @  [employer]   04/16/19 1539  Weight: 154 lb 9.6 oz (70.1 kg)     Physical Exam  General Appearance:    Alert, cooperative, no distress, appears stated age - yes , Deconditioned looking - no , OBESE  - no, Sitting on Wheelchair -  no  Head:    Normocephalic, without obvious abnormality, atraumatic  Eyes:    PERRL, conjunctiva/corneas clear,  Ears:    Normal TM's and external ear canals, both ears  Nose:   Nares normal, septum midline, mucosa normal, no drainage    or sinus tenderness. OXYGEN ON  - no . Patient is @ ra   Throat:  MASK  Neck:   Supple, symmetrical, trachea midline, no adenopathy;    thyroid:  no enlargement/tenderness/nodules; no carotid   bruit or JVD  Back:     Symmetric, no curvature, ROM normal, no CVA tenderness  Lungs:     Distress - non , Wheeze ono, Barrell Chest -  no, Purse lip breathing - no, Crackles - no   Chest Wall:    No tenderness or deformity.    Heart:    Regular rate and rhythm, S1 and S2 normal, no rub   or gallop, Murmur - no  Breast Exam:    NOT DONE  Abdomen:     Soft, non-tender, bowel sounds active all four quadrants,    no masses, no organomegaly. Visceral obesity - mild  Genitalia:   NOT DONE  Rectal:   NOT DONE  Extremities:   Extremities - normal, Has Cane - no, Clubbing - no, Edema - no  Pulses:   2+ and symmetric all extremities  Skin:   Stigmata of Connective Tissue Disease - no  Lymph nodes:   Cervical, supraclavicular, and axillary nodes normal  Psychiatric:  Neurologic:   Pleasant - yes, Anxious - no, Flat affect - no  CAm-ICU - neg, Alert and Oriented x 3 - yes, Moves all 4s - yes, Speech - normal, Cognition - intact           Assessment:       ICD-10-CM   1. Acute bronchitis, unspecified organism  J20.9   2. Eosinophilia  D72.1   3. Immunosuppressed status ([place])  D89.9        Plan:     Patient Instructions     ICD-10-CM   1. Acute bronchitis, unspecified organism  J20.9   2. Eosinophilia  D72.1   3. Immunosuppressed status ([place])  D89.9     ? Due to asthma flare ? Post viral reactive ? Other rare situations in context of Humira  Plan Check cbc with diff for esosinophil count Check Covid IgG serology (low prob this is covid-19) Do CXR 2 view  Take doxycycline 152m po twice daily x 5 days; take after meals and avoid sunlight Take prednisone 40 mg daily x 2 days, then 26mdaily x 2 days, then 1042maily x 2 days, then 5mg58mily x 2 days and stop  Followup Will call with results Flu shot in few weeks when better As needed in few weeks if not resolved but otherwise 6 months or sooner if needed      SIGNATURE    Dr. MuraBrand MalesD., F.C.C.P,  Pulmonary and Critical Care Medicine Staff Physician, [facility] - [facility] Disease  Program  Pulmonary  [place], [id number]: [phone] no answer or between  15:00h - 7:00h: call [phone] Telephone: [phone]  4:17 PM 04/16/2019

## 2019-04-17 ENCOUNTER — Ambulatory Visit (INDEPENDENT_AMBULATORY_CARE_PROVIDER_SITE_OTHER): Payer: BC Managed Care – PPO

## 2019-04-17 DIAGNOSIS — R05 Cough: Secondary | ICD-10-CM | POA: Diagnosis not present

## 2019-04-17 DIAGNOSIS — J209 Acute bronchitis, unspecified: Secondary | ICD-10-CM | POA: Diagnosis not present

## 2019-04-17 DIAGNOSIS — E559 Vitamin D deficiency, unspecified: Secondary | ICD-10-CM | POA: Diagnosis not present

## 2019-04-17 LAB — CBC WITH DIFFERENTIAL/PLATELET
Basophils Absolute: 0 10*3/uL (ref 0.0–0.1)
Basophils Relative: 0.7 % (ref 0.0–3.0)
Eosinophils Absolute: 0.2 10*3/uL (ref 0.0–0.7)
Eosinophils Relative: 2.7 % (ref 0.0–5.0)
HCT: 38.9 % (ref 36.0–46.0)
Hemoglobin: 12.9 g/dL (ref 12.0–15.0)
Lymphocytes Relative: 28 % (ref 12.0–46.0)
Lymphs Abs: 1.6 10*3/uL (ref 0.7–4.0)
MCHC: 33.2 g/dL (ref 30.0–36.0)
MCV: 87.7 fl (ref 78.0–100.0)
Monocytes Absolute: 0.3 10*3/uL (ref 0.1–1.0)
Monocytes Relative: 5.2 % (ref 3.0–12.0)
Neutro Abs: 3.6 10*3/uL (ref 1.4–7.7)
Neutrophils Relative %: 63.4 % (ref 43.0–77.0)
Platelets: 293 10*3/uL (ref 150.0–400.0)
RBC: 4.43 Mil/uL (ref 3.87–5.11)
RDW: 13.3 % (ref 11.5–15.5)
WBC: 5.6 10*3/uL (ref 4.0–10.5)

## 2019-04-17 LAB — SAR COV2 SEROLOGY (COVID19)AB(IGG),IA: SARS CoV2 AB IGG: NEGATIVE

## 2019-04-20 ENCOUNTER — Telehealth: Payer: Self-pay | Admitting: Internal Medicine

## 2019-04-20 NOTE — Telephone Encounter (Signed)
Message routed to Dr. Chase Caller for review and recommendations.  Dr. Chase Caller, this patient was seen by you 04/16/19. Please review the labs and xray and advise of recommendations. Thank you.

## 2019-04-21 ENCOUNTER — Telehealth: Payer: Self-pay | Admitting: Internal Medicine

## 2019-04-21 NOTE — Telephone Encounter (Signed)
Do you want me tos set up a visit with [name]?

## 2019-04-21 NOTE — Telephone Encounter (Signed)
Call returned to patient, she reports she called her cardiologist office. They are going to make her an appt to have her echocardiogram done. Voiced understanding. Nothing further needed at this time.

## 2019-04-21 NOTE — Telephone Encounter (Signed)
Call returned to patient, made aware of MR recommendations. She requests that we send to her cardiologist for him to review prior to placing order for echocardiogram.   CXR routed to Dr. Einar Gip. Will cc Dr. Einar Gip on this message as well.   Nothing further is needed at this time.

## 2019-04-21 NOTE — Telephone Encounter (Signed)
covid serolgy  - negative  CXR - clear but they are saying that heart size is borderline large - normal. Therefore, I recommend ECHO - indication cardiomegaly - just to be on the safe side

## 2019-04-22 ENCOUNTER — Telehealth: Payer: Self-pay | Admitting: Internal Medicine

## 2019-04-22 DIAGNOSIS — K5190 Ulcerative colitis, unspecified, without complications: Secondary | ICD-10-CM | POA: Diagnosis not present

## 2019-04-22 DIAGNOSIS — M79643 Pain in unspecified hand: Secondary | ICD-10-CM | POA: Diagnosis not present

## 2019-04-22 DIAGNOSIS — M7531 Calcific tendinitis of right shoulder: Secondary | ICD-10-CM | POA: Diagnosis not present

## 2019-04-22 DIAGNOSIS — M2550 Pain in unspecified joint: Secondary | ICD-10-CM | POA: Diagnosis not present

## 2019-04-22 DIAGNOSIS — I517 Cardiomegaly: Secondary | ICD-10-CM

## 2019-04-22 NOTE — Telephone Encounter (Signed)
Yes, that would be great. Dr. Chase Caller was going to order an echocardiogram and the patient requested to have you review the CXR and address the echocardiogram.   Thanks.

## 2019-04-22 NOTE — Telephone Encounter (Signed)
Dr. Chase Caller, please see telephone message dated 04/20/19. What time frame and where did you want the Echo done? The order has been added but it is pending. Please review the order to make sure everything you want has been addressed. Thank you.

## 2019-04-22 NOTE — Telephone Encounter (Signed)
Please schedule OV, Cardiomegaly. Ref Dr. Chase Caller.  Let me see her first and then order what is appropriate. JG

## 2019-04-23 NOTE — Telephone Encounter (Signed)
Called and spoke to pt. Pt states she has had some trouble trying to schedule the appt with Dr. Irven Shelling office and prefers to move forward with the echo and then if she needs an appt with Dr. Einar Gip at that time then she will decide at that time. Order has been placed. Will send to MR as FYI.   Will forward to PCC's to schedule.

## 2019-04-23 NOTE — Telephone Encounter (Signed)
I checked and pt has already been scheduled for echo on 10/6 at 10:35.  I checked with [place] and [place] and she scheduled this from the workque.  Nothing further needed.

## 2019-04-23 NOTE — Telephone Encounter (Signed)
I saw another phone message saying she wants to coordinate this with Dr Einar Gip . Please refer to that message and check with patient where she is in process

## 2019-04-28 ENCOUNTER — Ambulatory Visit (HOSPITAL_COMMUNITY): Payer: BC Managed Care – PPO | Attending: Cardiology

## 2019-04-28 ENCOUNTER — Other Ambulatory Visit: Payer: Self-pay

## 2019-04-28 DIAGNOSIS — I517 Cardiomegaly: Secondary | ICD-10-CM | POA: Insufficient documentation

## 2019-04-29 ENCOUNTER — Telehealth: Payer: Self-pay | Admitting: Internal Medicine

## 2019-04-29 ENCOUNTER — Other Ambulatory Visit: Payer: Self-pay | Admitting: Internal Medicine

## 2019-04-29 NOTE — Telephone Encounter (Signed)
Call returned to patient, confirmed DOB, she reports she had the echocardiogram. She states when she saw MR a few weeks ago he told her to wait on getting the flu shot. She states she wants to know if its okay for her to go ahead and get it. I made her aware that MR returns to clinic on Monday. She is okay waiting until then. She reports she is not having any symptoms of an exacerbation or out of the normal, she just wanted to be sure since she had the echo it was okay to go ahead and get the flu shot.   MR please advise. Thanks.

## 2019-04-29 NOTE — Telephone Encounter (Signed)
Ok to have flu shot    SIGNATURE    Dr. Brand Males, M.D., F.C.C.P,  Pulmonary and Critical Care Medicine Staff Physician, [place] Director - Interstitial Lung Disease  Program  Pulmonary [place], [place], [address]  Pager: [phone], If no answer or between  15:00h - 7:00h: call [phone] Telephone: [phone]  12:00 PM 04/29/2019

## 2019-04-29 NOTE — Telephone Encounter (Signed)
Spoke with the pt  She wanted to verify her ECHO results again  I went over them again  She has already scheduled appt with Dr Einar Gip  Nothing further needed

## 2019-04-29 NOTE — Telephone Encounter (Signed)
Spoke with the pt and notified of recs per MR  She verbalized understanding  I faxed her ECHO results to Dr Nadyne Coombes per her request

## 2019-04-29 NOTE — Telephone Encounter (Signed)
Regarding echo - is normal but she does have stiff heart muscle tha can contribute to shortness of breath. Something she should talk to Dr Einar Gip about . Is not urgent

## 2019-05-01 ENCOUNTER — Ambulatory Visit (INDEPENDENT_AMBULATORY_CARE_PROVIDER_SITE_OTHER): Payer: BC Managed Care – PPO | Admitting: Cardiology

## 2019-05-01 ENCOUNTER — Encounter: Payer: Self-pay | Admitting: Cardiology

## 2019-05-01 ENCOUNTER — Other Ambulatory Visit: Payer: Self-pay

## 2019-05-01 ENCOUNTER — Telehealth: Payer: Self-pay

## 2019-05-01 VITALS — Temp 97.1°F | Ht 60.0 in | Wt 146.0 lb

## 2019-05-01 DIAGNOSIS — E782 Mixed hyperlipidemia: Secondary | ICD-10-CM

## 2019-05-01 DIAGNOSIS — R42 Dizziness and giddiness: Secondary | ICD-10-CM

## 2019-05-01 NOTE — Progress Notes (Signed)
Patient referred by [name], MD for abnormal echocardiogram.  Subjective:   Danielle Bond, female    DOB: [birth year], 58 y.o.   MRN: [id number]   Chief Complaint  Patient presents with  . Abnormal echocardiogram    HPI  58 year old female with hyperlipidemia, Graves' disease, status post thyroidectomy, ulcerative colitis, kidney stone and history of acute pancreatitis, migraine, seen by pulmonology for respiratory issues in the context of fever of unknown origin and bloody stools.   Her symptoms were thought to be acute bronchitis with possibility of asthma flare. Workup showed cardiomegaly on chest X-ray that led to echocardiogram-showing normal LV size and thickness, normal EF< impaired relaxation, no significant valvular abnormalities.   Patient reports one episode of near syncope while standing in the kitchen. She did not lose consciousness. Patient was last seen by me about a year ago. Prior workup below.    Past Medical History:  Diagnosis Date  . Asthma   . Chronic kidney disease   . Hx of pancreatitis 06/16/2018  . Kidney stone   . Kidney stone   . Migraine   . Orthostatic hypotension   . Osteopenia 03/31/2012  . Ulcerative colitis ([place]) 03/31/2012     Past Surgical History:  Procedure Laterality Date  . CESAREAN SECTION     x3  . CHOLECYSTECTOMY    . COLONOSCOPY    . LITHOTRIPSY    . THYROIDECTOMY     1997     Social History   Socioeconomic History  . Marital status: Married    Spouse name: Not on file  . Number of children: 2  . Years of education: some college  . Highest education level: Not on file  Occupational History  . Occupation: Self-employed  Social Needs  . Financial resource strain: Not on file  . Food insecurity    Worry: Not on file    Inability: Not on file  . Transportation needs    Medical: Not on file    Non-medical: Not on file  Tobacco Use  . Smoking status: Never Smoker  . Smokeless tobacco: Never Used   Substance and Sexual Activity  . Alcohol use: No  . Drug use: No  . Sexual activity: Yes  Lifestyle  . Physical activity    Days per week: Not on file    Minutes per session: Not on file  . Stress: Not on file  Relationships  . Social Herbalist on phone: Not on file    Gets together: Not on file    Attends religious service: Not on file    Active member of club or organization: Not on file    Attends meetings of clubs or organizations: Not on file    Relationship status: Not on file  . Intimate partner violence    Fear of current or ex partner: Not on file    Emotionally abused: Not on file    Physically abused: Not on file    Forced sexual activity: Not on file  Other Topics Concern  . Not on file  Social History Narrative   Lives at home with her husband.   Right-handed.   No caffeine use.     Family History  Problem Relation Age of Onset  . Cervical cancer Paternal Aunt   . Heart disease Paternal Grandmother   . Heart disease Mother   . Dementia Mother   . Thyroid disease Mother   . Osteoarthritis Mother   . Asthma  Mother   . Heart attack Father   . Breast cancer Cousin      Current Outpatient Medications on File Prior to Visit  Medication Sig Dispense Refill  . Albuterol Sulfate (PROAIR RESPICLICK) 789 (90 Base) MCG/ACT AEPB Inhale 1 puff into the lungs every 4 (four) hours as needed. 1 each 0  . CRESTOR 10 MG tablet Take 10 mg by mouth daily.   1  . ELDERBERRY PO Take 1 tablet by mouth daily.    . fluticasone furoate-vilanterol (BREO ELLIPTA) 100-25 MCG/INH AEPB Inhale 1 puff into the lungs daily. 60 each 5  . folic acid (FOLVITE) 1 MG tablet Take 1 mg by mouth daily.    Marland Kitchen HUMIRA PEN 40 MG/0.4ML PNKT every 14 (fourteen) days.     Marland Kitchen levothyroxine (SYNTHROID, LEVOTHROID) 112 MCG tablet Take 112 mcg by mouth daily before breakfast.    . Multiple Vitamin (MULTIVITAMIN WITH MINERALS) TABS tablet Take 1 tablet by mouth daily.    . Multiple  Vitamins-Minerals (GNP HAIR/SKIN/NAILS PO) Take by mouth.    . vitamin C (ASCORBIC ACID) 500 MG tablet Take 500 mg by mouth daily.    . Vitamin D, Ergocalciferol, (DRISDOL) 1.25 MG (50000 UT) CAPS capsule Take 50,000 Units by mouth every 7 (seven) days.     No current facility-administered medications on file prior to visit.     Cardiovascular studies:  EKG 05/01/2019: Sinus rhythm 97 vpm. Nonspecific T-abnormality.   Echocardiogram 04/28/2019: Normal LV size and thickness. EF 55-60%. Impaired relaxation. Normal RV size and function. No significant valvular abnormality.   CTA chest 05/23/2018: No demonstrable pulmonary embolus. No thoracic aortic aneurysm or dissection. There is aortic atherosclerosis. Right subclavian artery arises distally to the other great vessels, passing posteriorly to the esophagus and impressing upon the esophagus as it passes to the right. No lung edema or consolidation. No evident thoracic adenopathy. Aortic Atherosclerosis (ICD10-I70.0).  CXR 04/17/2019: Mild cardiomegaly without acute abnormality of the lungs.  CTA coronary angiogram 12/2017: pLAD minimal atherosclerosis. Rest normal. Calcium score 6.   Recent labs: Results for Danielle, Bond (MRN [id number]) as of 05/01/2019 05:32  Ref. Range 03/03/2019 16:44  Sodium Latest Ref Range: 135 - 145 mmol/L 139  Potassium Latest Ref Range: 3.5 - 5.1 mmol/L 3.6  Chloride Latest Ref Range: 98 - 111 mmol/L 105  CO2 Latest Ref Range: 22 - 32 mmol/L 24  Glucose Latest Ref Range: 70 - 99 mg/dL 105 (H)  BUN Latest Ref Range: 6 - 20 mg/dL 14  Creatinine Latest Ref Range: 0.44 - 1.00 mg/dL 0.76  Calcium Latest Ref Range: 8.9 - 10.3 mg/dL 9.4  Anion gap Latest Ref Range: 5 - 15  10   Results for LATRISHA, Bond (MRN [id number]) as of 05/01/2019 05:32  Ref. Range 04/16/2019 16:27  WBC Latest Ref Range: 4.0 - 10.5 K/uL 5.6  RBC Latest Ref Range: 3.87 - 5.11 Mil/uL 4.43  Hemoglobin Latest Ref Range: 12.0 - 15.0  g/dL 12.9  HCT Latest Ref Range: 36.0 - 46.0 % 38.9  MCV Latest Ref Range: 78.0 - 100.0 fl 87.7  MCHC Latest Ref Range: 30.0 - 36.0 g/dL 33.2  RDW Latest Ref Range: 11.5 - 15.5 % 13.3  Platelets Latest Ref Range: 150.0 - 400.0 K/uL 293.0   Lipid panel 10/2015: Chol 184, TG 84, HDL 1813, LDL 84.  Lipid panel 02/2014: Chol 260, TG 73, HDL 59, LDL 193   ROS       Vitals:   05/01/19 1004 05/01/19 1005  Temp:    SpO2: 98% 98%     Body mass index is 28.51 kg/m. Filed Weights   05/01/19 0952  Weight: 146 lb (66.2 kg)     Objective:   Physical Exam        Assessment & Recommendations:   58 year old female with hyperlipidemia, Graves' disease, status post thyroidectomy, ulcerative colitis, kidney stone and history of acute pancreatitis, migraine, now referred for abnormal echocardiogram, presyncope  Presyncope: Likely due to orthostatic hypotension. While echocardiogram shows grade 1 diastolic dysfunction, no clinical evidence of heart failure.  Recommend liberal hydration, compression stockings.  Hyperlipidemia: Continue statin.  Recheck lipid panel.   Nigel Mormon, MD [facility] Pager: [phone] Office: [phone] If no answer Cell [phone]

## 2019-05-01 NOTE — Telephone Encounter (Signed)
Copied from [place] [id number]. Topic: General - Other >> May 01, 2019 12:27 PM Leward Quan A wrote: Reason for CRM: Patient called to ask Dr [name] if it would be ok for her to come in and get her lab which is requested by Dr Joya Gaskins at this office. Say that she feel more comfortable with the techs there at [facility]. Please call patient at Ph# [phone] She can not wait until her appointment in December So asking for a call soon please

## 2019-05-02 ENCOUNTER — Encounter: Payer: Self-pay | Admitting: Cardiology

## 2019-05-02 DIAGNOSIS — E782 Mixed hyperlipidemia: Secondary | ICD-10-CM | POA: Insufficient documentation

## 2019-05-02 DIAGNOSIS — E785 Hyperlipidemia, unspecified: Secondary | ICD-10-CM | POA: Insufficient documentation

## 2019-05-02 DIAGNOSIS — R42 Dizziness and giddiness: Secondary | ICD-10-CM | POA: Insufficient documentation

## 2019-05-02 DIAGNOSIS — R55 Syncope and collapse: Secondary | ICD-10-CM | POA: Insufficient documentation

## 2019-05-04 NOTE — Telephone Encounter (Signed)
Pt want to know the status of her coming here to get her labs done from Dr [facility] office. Please call pt to advise

## 2019-05-04 NOTE — Telephone Encounter (Signed)
See note

## 2019-05-04 NOTE — Telephone Encounter (Signed)
Patient notified that its not a [facility] lab and we are not able to collect labs.Patient voices understanding.

## 2019-05-11 DIAGNOSIS — K5190 Ulcerative colitis, unspecified, without complications: Secondary | ICD-10-CM | POA: Diagnosis not present

## 2019-05-13 DIAGNOSIS — M810 Age-related osteoporosis without current pathological fracture: Secondary | ICD-10-CM | POA: Diagnosis not present

## 2019-05-13 DIAGNOSIS — M79644 Pain in right finger(s): Secondary | ICD-10-CM | POA: Diagnosis not present

## 2019-05-13 DIAGNOSIS — M7531 Calcific tendinitis of right shoulder: Secondary | ICD-10-CM | POA: Diagnosis not present

## 2019-05-13 DIAGNOSIS — K5190 Ulcerative colitis, unspecified, without complications: Secondary | ICD-10-CM | POA: Diagnosis not present

## 2019-05-14 ENCOUNTER — Telehealth: Payer: Self-pay

## 2019-05-14 NOTE — Telephone Encounter (Signed)
Pt called 05/13/19 requesting last office note with Dr. Virgina Jock be sent to [facility] with Dr. Kathlene November with [facility].  Phone [phone] and fax [phone].    On 05/14/19, I contacted Estill Bamberg at [facility] and faxed office note to her.  EKG was routed via Epic to Dr. Kathlene November.    Pt was called to let her know this was addressed.

## 2019-05-20 ENCOUNTER — Telehealth: Payer: Self-pay

## 2019-05-20 DIAGNOSIS — E039 Hypothyroidism, unspecified: Secondary | ICD-10-CM | POA: Diagnosis not present

## 2019-05-20 NOTE — Telephone Encounter (Signed)
Please schedule patient for virtual visit.  Cannot come in office with sore throat.

## 2019-05-20 NOTE — Telephone Encounter (Signed)
Copied from [place] [id number]. Topic: General - Other >> May 20, 2019  1:22 PM Wynetta Emery, [place] C wrote: Reason for CRM: pt called in to schedule a ov with PCP for swollen lymphoids. Pt says that she has a sore throat but is requesting to be seen in person.   CB: [phone] -please assist pt directly

## 2019-05-21 ENCOUNTER — Telehealth: Payer: Self-pay | Admitting: Internal Medicine

## 2019-05-21 ENCOUNTER — Encounter: Payer: Self-pay | Admitting: Adult Health

## 2019-05-21 ENCOUNTER — Telehealth (INDEPENDENT_AMBULATORY_CARE_PROVIDER_SITE_OTHER): Payer: BC Managed Care – PPO | Admitting: Adult Health

## 2019-05-21 DIAGNOSIS — J019 Acute sinusitis, unspecified: Secondary | ICD-10-CM

## 2019-05-21 DIAGNOSIS — J4530 Mild persistent asthma, uncomplicated: Secondary | ICD-10-CM | POA: Diagnosis not present

## 2019-05-21 MED ORDER — AZITHROMYCIN 250 MG PO TABS: ORAL_TABLET | ORAL | 0 refills | Status: AC

## 2019-05-21 NOTE — Telephone Encounter (Signed)
Spoke with patient.  Mychart visit set up for this afternoon with TP Nothing further needed at this time

## 2019-05-21 NOTE — Telephone Encounter (Signed)
Left voice message for patient to call clinic.  

## 2019-05-21 NOTE — Patient Instructions (Signed)
Z-Pak take as directed Mucinex twice daily as needed for congestion Saline nasal rinses Fluids and rest, Continue on Breo daily  rinse after use Follow-up with Dr. Chase Caller as planned and as needed Please contact office for sooner follow up if symptoms do not improve or worsen or seek emergency care

## 2019-05-21 NOTE — Telephone Encounter (Signed)
Would be better to do sick virtually , please set up video visit with me this evening .   Please contact office for sooner follow up if symptoms do not improve or worsen or seek emergency care

## 2019-05-21 NOTE — Progress Notes (Signed)
Virtual Visit via Video Note  I connected with Danielle Bond on 05/21/19 at  2:00 PM EDT by a video enabled telemedicine application and verified that I am speaking with the correct person using two identifiers.  Location: Patient: Home  Provider: Office    I discussed the limitations of evaluation and management by telemedicine and the availability of in person appointments. The patient expressed understanding and agreed to proceed.  History of Present Illness: 58 year old female never smoker followed for asthma, eosinophilia (possibly secondary to the [place])  Medical history significant for Graves' disease, ulcerative colitis (now on Humira) , pancreatitis  Today's video visit is for an acute office visit.  Patient complains over the last 2 weeks that she has had sinus pain and pressure especially on the right side ear pressure sore throat and swollen glands on the right side of her neck.  She says she has fatigue pain and pressure when she bends forward. Patient was seen last visit for an acute bronchitis.  Patient was treated with doxycycline x5 days.  Patient says that her symptoms did improve her cough and congestion are better. Chest x-ray last month showed clear lungs .  COVID-19 testing was negative Patient is on Breo for her asthma.  Says her breathing has been doing fine with no wheezing or shortness of breath. She denies any fever, chest pain, orthopnea, edema, rash, loss of taste or smell, body aches.    observations/Objective: rheumatology: Sjogren antibody centromere antibody, Jo 1 antibody, chromatin antibody, SSA, SSB, anti-scleroderma antibody, Smith antibody, double-stranded DNA antibody and RNP antibody and ESR all normal.  TSH is normal.  Hemoglobin is 12 g%.  2D echo April 28, 2019 showed EF 55 to 60%  Appears comfortable, no apparent distress, speaks in full sentences.  Assessment and Plan: Acute sinusitis-patient has multiple allergies.  We will try a Z-Pak  as she has done well with this in the past encouraged on sinus rinses.  Asthma -appears compensated without flare   Coliits - stable . Continue on probiotic and yogurt.  On immunosuppression , if not improving from above or recurrent infections will need to evaluate further   Plan  Patient Instructions  Z-Pak take as directed Mucinex twice daily as needed for congestion Saline nasal rinses Fluids and rest, Continue on Breo daily  rinse after use Follow-up with Dr. Chase Caller as planned and as needed Please contact office for sooner follow up if symptoms do not improve or worsen or seek emergency care        Follow Up Instructions: Follow-up in 3 months and as needed   I discussed the assessment and treatment plan with the patient. The patient was provided an opportunity to ask questions and all were answered. The patient agreed with the plan and demonstrated an understanding of the instructions.   The patient was advised to call back or seek an in-person evaluation if the symptoms worsen or if the condition fails to improve as anticipated.  I provided 25 minutes of non-face-to-face time during this encounter.   Rexene Edison, NP

## 2019-05-21 NOTE — Telephone Encounter (Signed)
Patient refused a virtual appointment and demands to see provider. She wants to speak to the clinical team only. Call patient to advise and schedule what is needed.

## 2019-05-21 NOTE — Telephone Encounter (Signed)
Primary Pulmonologist: Ramaswamy Last office visit and with whom: 04/16/19 with MR What do we see them for (pulmonary problems): bronchitis Last OV assessment/plan:   Plan:     Patient Instructions     ICD-10-CM   1. Acute bronchitis, unspecified organism  J20.9   2. Eosinophilia  D72.1   3. Immunosuppressed status ([place])  D89.9     ? Due to asthma flare ? Post viral reactive ? Other rare situations in context of Humira  Plan Check cbc with diff for esosinophil count Check Covid IgG serology (low prob this is covid-19) Do CXR 2 view  Take doxycycline 134m po twice daily x 5 days; take after meals and avoid sunlight Take prednisone 40 mg daily x 2 days, then 290mdaily x 2 days, then 1036maily x 2 days, then 5mg2mily x 2 days and stop  Followup Will call with results Flu shot in few weeks when better As needed in few weeks if not resolved but otherwise 6 months or sooner if needed   Was appointment offered to patient (explain)?  Patient would like to be seen in office , TP please advise or she is willing to do mychart visit as well.   Reason for call: Patient started with night sweats, a deep dry hacking cough 2 weeks ago. Patient is not experiencing right side of throat swollen and right ear pain , feels her lymph nodes are swollen on right side under her neck. She has not had any fevers. Did take Aleve yesterday 05/20/19 to alleviate some pain. She was tested for covid in 03/2019.     (examples of things to ask: : When did symptoms start? Fever? Cough? Productive? Color to sputum? More sputum than usual? Wheezing? Have you needed increased oxygen? Are you taking your respiratory medications? What over the counter measures have you tried?)

## 2019-05-22 NOTE — Telephone Encounter (Signed)
Patient requesting to come in the office for  swollen lymph noids and right side of throat is painful.,Patient had a virtual visit at Pulmonary yesterday,they  prescribed a Z Pak.She stated if she is not feeling better within the next 4 days she wants to come in office. She stated this has been going on since Nov. Please advise

## 2019-05-22 NOTE — Telephone Encounter (Signed)
Left voice message to patient.

## 2019-05-22 NOTE — Telephone Encounter (Signed)
Noted. Will need repeat covid PCR before we can let her come in the office if she is symptomatic.  [name]. [name], MD 05/22/2019 4:24 PM

## 2019-05-25 ENCOUNTER — Telehealth: Payer: Self-pay | Admitting: Family Medicine

## 2019-05-25 ENCOUNTER — Other Ambulatory Visit: Payer: Self-pay

## 2019-05-25 DIAGNOSIS — Z20822 Contact with and (suspected) exposure to covid-19: Secondary | ICD-10-CM

## 2019-05-25 NOTE — Telephone Encounter (Signed)
See note  Copied from [place] [id number]. Topic: General - Other >> May 25, 2019  2:45 PM Leward Quan A wrote: Reason for CRM: Patient called to speak to [facility] states that she went and had covid test done this AM and need a call back from her to discuss future appointment. Ph# [phone]

## 2019-05-25 NOTE — Telephone Encounter (Signed)
Left detailed message to see how patient is doing after taking the Z-pak also explained she needs to have a Covid test in order to come into the office.

## 2019-05-26 LAB — NOVEL CORONAVIRUS, NAA: SARS-CoV-2, NAA: NOT DETECTED

## 2019-05-26 NOTE — Telephone Encounter (Signed)
Spoke with patient once results are received she can call to scheduled appt, she voices understanding.

## 2019-05-28 DIAGNOSIS — E782 Mixed hyperlipidemia: Secondary | ICD-10-CM | POA: Diagnosis not present

## 2019-05-29 ENCOUNTER — Ambulatory Visit: Payer: BC Managed Care – PPO | Admitting: Cardiology

## 2019-05-29 ENCOUNTER — Encounter: Payer: Self-pay | Admitting: Family Medicine

## 2019-05-29 ENCOUNTER — Ambulatory Visit (INDEPENDENT_AMBULATORY_CARE_PROVIDER_SITE_OTHER): Payer: BC Managed Care – PPO | Admitting: Family Medicine

## 2019-05-29 VITALS — BP 138/84 | HR 99 | Temp 98.4°F | Ht 60.0 in | Wt 149.0 lb

## 2019-05-29 DIAGNOSIS — R591 Generalized enlarged lymph nodes: Secondary | ICD-10-CM | POA: Diagnosis not present

## 2019-05-29 DIAGNOSIS — R590 Localized enlarged lymph nodes: Secondary | ICD-10-CM

## 2019-05-29 LAB — URINALYSIS, ROUTINE W REFLEX MICROSCOPIC
Bilirubin Urine: NEGATIVE
Hgb urine dipstick: NEGATIVE
Ketones, ur: NEGATIVE
Leukocytes,Ua: NEGATIVE
Nitrite: NEGATIVE
Specific Gravity, Urine: <=1.005 — AB (ref 1.000–1.030)
Total Protein, Urine: NEGATIVE
Urine Glucose: NEGATIVE
Urobilinogen, UA: 0.2 (ref 0.0–1.0)
pH: 6 (ref 5.0–8.0)

## 2019-05-29 LAB — COMPREHENSIVE METABOLIC PANEL
ALT: 14 U/L (ref 0–35)
AST: 24 U/L (ref 0–37)
Albumin: 3.8 g/dL (ref 3.5–5.2)
Alkaline Phosphatase: 61 U/L (ref 39–117)
BUN: 13 mg/dL (ref 6–23)
CO2: 26 mEq/L (ref 19–32)
Calcium: 8.9 mg/dL (ref 8.4–10.5)
Chloride: 105 mEq/L (ref 96–112)
Creatinine, Ser: 0.72 mg/dL (ref 0.40–1.20)
GFR: 83.03 mL/min (ref >60.00)
Glucose, Bld: 129 mg/dL — ABNORMAL HIGH (ref 70–99)
Potassium: 4 mEq/L (ref 3.5–5.1)
Sodium: 139 mEq/L (ref 135–145)
Total Bilirubin: 0.5 mg/dL (ref 0.2–1.2)
Total Protein: 7 g/dL (ref 6.0–8.3)

## 2019-05-29 LAB — CBC WITH DIFFERENTIAL/PLATELET
Basophils Absolute: 0 10*3/uL (ref 0.0–0.1)
Basophils Relative: 0.3 % (ref 0.0–3.0)
Eosinophils Absolute: 0.1 10*3/uL (ref 0.0–0.7)
Eosinophils Relative: 1 % (ref 0.0–5.0)
HCT: 39 % (ref 36.0–46.0)
Hemoglobin: 13 g/dL (ref 12.0–15.0)
Lymphocytes Relative: 24.4 % (ref 12.0–46.0)
Lymphs Abs: 1.6 10*3/uL (ref 0.7–4.0)
MCHC: 33.2 g/dL (ref 30.0–36.0)
MCV: 87.7 fl (ref 78.0–100.0)
Monocytes Absolute: 0.4 10*3/uL (ref 0.1–1.0)
Monocytes Relative: 5.3 % (ref 3.0–12.0)
Neutro Abs: 4.6 10*3/uL (ref 1.4–7.7)
Neutrophils Relative %: 69 % (ref 43.0–77.0)
Platelets: 329 10*3/uL (ref 150.0–400.0)
RBC: 4.45 Mil/uL (ref 3.87–5.11)
RDW: 13.1 % (ref 11.5–15.5)
WBC: 6.7 10*3/uL (ref 4.0–10.5)

## 2019-05-29 LAB — LIPID PANEL
Chol/HDL Ratio: 2.7 ratio (ref 0.0–4.4)
Cholesterol, Total: 175 mg/dL (ref 100–199)
HDL: 66 mg/dL (ref >39)
LDL Chol Calc (NIH): 96 mg/dL (ref 0–99)
Triglycerides: 70 mg/dL (ref 0–149)
VLDL Cholesterol Cal: 13 mg/dL (ref 5–40)

## 2019-05-29 LAB — TSH: TSH: 1.77 u[IU]/mL (ref 0.35–4.50)

## 2019-05-29 NOTE — Patient Instructions (Signed)
It was very nice to see you today!  We will check blood work and a urine sample today.  We will also get a ct scan to make sure there is nothing else going on.   Take care, Dr [name]  Please try these tips to maintain a healthy lifestyle:   Eat at least 3 REAL meals and 1-2 snacks per day.  Aim for no more than 5 hours between eating.  If you eat breakfast, please do so within one hour of getting up.    Obtain twice as many fruits/vegetables as protein or carbohydrate foods for both lunch and dinner. (Half of each meal should be fruits/vegetables, one quarter protein, and one quarter starchy carbs)   Cut down on sweet beverages. This includes juice, soda, and sweet tea.    Exercise at least 150 minutes every week.

## 2019-05-29 NOTE — Progress Notes (Signed)
   Chief Complaint:  Danielle Bond is a 58 y.o. female who presents today with a chief complaint of swollen lymph nodes.   Assessment/Plan:  Lymphadenopathy Likely reactive lymph nodes related to her inflammatory bowel disease however given constellation of weight loss and night sweats in addition to anatomic location of lymphadenopathy, we cannot definitively rule out malignancy.  We will check labs today including CBC with differential, C met, and TSH.  Given her episode of hematuria we will also check a UA today.  We will plan to check CT chest abdomen pelvis with contrast to rule out of malignancy.  If work-up is unremarkable and continues to have unexplained lymphadenopathy would consider further work-up with ultrasound\for biopsy.    Subjective:  HPI:  Swollen Lymph  Nodes Patient reports intermittent swollen lymph nodes for the past year.  Symptoms have significantly worsened within the last month or so.  Lymph nodes are predominantly located in her right and left neck just above her collarbone.  She has had some associated weight loss symptoms lost 10 pounds unintentionally over the last few months.  She is also had several episodes of night sweats.  She recently saw her rheumatologist who did blood work which was reportedly notable for "elevated inflammatory markers".  She is seeing a GI for ulcerative colitis and is currently on Humira.  No fever or chills.  She had an episode of hematuria a few weeks ago.  Has not had anything since then.  No abdominal pain.  No melena or hematochezia.  ROS: Per HPI  PMH: She reports that she has never smoked. She has never used smokeless tobacco. She reports that she does not drink alcohol or use drugs.      Objective:  Physical Exam: BP 138/84 (BP Location: Left Arm, Patient Position: Sitting, Cuff Size: Normal)   Pulse 99   Temp 98.4 F (36.9 C) (Oral)   Ht 5' (1.524 m)   Wt 149 lb (67.6 kg)   LMP 03/03/2013   SpO2 97%   BMI 29.10  kg/m   Wt Readings from Last 3 Encounters:  05/29/19 149 lb (67.6 kg)  05/01/19 146 lb (66.2 kg)  04/16/19 154 lb 9.6 oz (70.1 kg)  Gen: NAD, resting comfortably HEENT: Tender lymphadenopathy noted at bilateral supraclavicular areas. CV: Regular rate and rhythm with no murmurs appreciated Pulm: Normal work of breathing, clear to auscultation bilaterally with no crackles, wheezes, or rhonchi GI: Normal bowel sounds present. Soft, Nontender, Nondistended. MSK: No edema, cyanosis, or clubbing noted Skin: Warm, dry Neuro: Grossly normal, moves all extremities Psych: Normal affect and thought content      Tamarion Haymond M. [name], MD 05/29/2019 10:39 AM

## 2019-06-01 NOTE — Progress Notes (Signed)
Please inform patient of the following:  Blood and urine tests all normal. We will contact her with results of CT scan once they are available.  Danielle Bond. [name], MD 06/01/2019 3:02 PM

## 2019-06-02 ENCOUNTER — Other Ambulatory Visit: Payer: Self-pay | Admitting: Family Medicine

## 2019-06-02 DIAGNOSIS — R590 Localized enlarged lymph nodes: Secondary | ICD-10-CM

## 2019-06-08 ENCOUNTER — Ambulatory Visit (INDEPENDENT_AMBULATORY_CARE_PROVIDER_SITE_OTHER): Payer: BC Managed Care – PPO | Admitting: Cardiology

## 2019-06-08 ENCOUNTER — Encounter: Payer: Self-pay | Admitting: Cardiology

## 2019-06-08 ENCOUNTER — Other Ambulatory Visit: Payer: Self-pay

## 2019-06-08 VITALS — BP 136/89 | HR 110 | Temp 97.6°F | Ht 60.0 in | Wt 150.0 lb

## 2019-06-08 DIAGNOSIS — R55 Syncope and collapse: Secondary | ICD-10-CM

## 2019-06-08 DIAGNOSIS — I700 Atherosclerosis of aorta: Secondary | ICD-10-CM | POA: Insufficient documentation

## 2019-06-08 DIAGNOSIS — R42 Dizziness and giddiness: Secondary | ICD-10-CM | POA: Diagnosis not present

## 2019-06-08 DIAGNOSIS — E782 Mixed hyperlipidemia: Secondary | ICD-10-CM

## 2019-06-08 DIAGNOSIS — I2510 Atherosclerotic heart disease of native coronary artery without angina pectoris: Secondary | ICD-10-CM

## 2019-06-08 MED ORDER — ROSUVASTATIN CALCIUM 20 MG PO TABS: 20.0000 mg | ORAL_TABLET | Freq: Every day | ORAL | 1 refills | Status: DC

## 2019-06-08 NOTE — Progress Notes (Signed)
Patient referred by [name], MD for abnormal echocardiogram.  Subjective:   Danielle Bond, female    DOB: 1961-05-15, 58 y.o.   MRN: [id number]   Chief Complaint  Patient presents with  . Coronary Artery Disease    follow up   . Hyperlipidemia    HPI  58 year old female with hyperlipidemia, aortic and coronary atherrosclerosis, presyncope, Graves' disease, status post thyroidectomy, ulcerative colitis, kidney stone and history of acute pancreatitis, migraine,  She has not had any recurrent syncopal episodes.  She is wearing compression stockings regularly.  She is trying to exercise regularly.  Past Medical History:  Diagnosis Date  . Asthma   . Chronic kidney disease   . Hx of pancreatitis 06/16/2018  . Kidney stone   . Kidney stone   . Migraine   . Orthostatic hypotension   . Osteopenia 03/31/2012  . Ulcerative colitis ([place]) 03/31/2012     Past Surgical History:  Procedure Laterality Date  . CESAREAN SECTION     x3  . CHOLECYSTECTOMY    . COLONOSCOPY    . LITHOTRIPSY    . THYROIDECTOMY     1997     Social History   Socioeconomic History  . Marital status: Married    Spouse name: Not on file  . Number of children: 2  . Years of education: some college  . Highest education level: Not on file  Occupational History  . Occupation: Self-employed  Social Needs  . Financial resource strain: Not on file  . Food insecurity    Worry: Not on file    Inability: Not on file  . Transportation needs    Medical: Not on file    Non-medical: Not on file  Tobacco Use  . Smoking status: Never Smoker  . Smokeless tobacco: Never Used  Substance and Sexual Activity  . Alcohol use: No  . Drug use: No  . Sexual activity: Yes  Lifestyle  . Physical activity    Days per week: Not on file    Minutes per session: Not on file  . Stress: Not on file  Relationships  . Social Herbalist on phone: Not on file    Gets together: Not on file    Attends  religious service: Not on file    Active member of club or organization: Not on file    Attends meetings of clubs or organizations: Not on file    Relationship status: Not on file  . Intimate partner violence    Fear of current or ex partner: Not on file    Emotionally abused: Not on file    Physically abused: Not on file    Forced sexual activity: Not on file  Other Topics Concern  . Not on file  Social History Narrative   Lives at home with her husband.   Right-handed.   No caffeine use.     Family History  Problem Relation Age of Onset  . Cervical cancer Paternal Aunt   . Heart disease Paternal Grandmother   . Heart disease Mother   . Dementia Mother   . Thyroid disease Mother   . Osteoarthritis Mother   . Asthma Mother   . Heart attack Father   . Breast cancer Cousin   . Heart attack Brother   . Heart disease Brother      Current Outpatient Medications on File Prior to Visit  Medication Sig Dispense Refill  . Albuterol Sulfate (PROAIR RESPICLICK) 917 (90  Base) MCG/ACT AEPB Inhale 1 puff into the lungs every 4 (four) hours as needed. 1 each 0  . BREO ELLIPTA 100-25 MCG/INH AEPB TAKE 1 PUFF BY MOUTH EVERY DAY 180 each 1  . Cholecalciferol (VITAMIN D3) 25 MCG (1000 UT) CAPS Take 1 capsule by mouth daily.    . CRESTOR 10 MG tablet Take 10 mg by mouth daily.   1  . ELDERBERRY PO Take 1 tablet by mouth daily.    . folic acid (FOLVITE) 1 MG tablet Take 1 mg by mouth daily.    Marland Kitchen HUMIRA PEN 40 MG/0.4ML PNKT every 14 (fourteen) days.     Marland Kitchen levothyroxine (SYNTHROID, LEVOTHROID) 112 MCG tablet Take 112 mcg by mouth daily before breakfast.    . Multiple Vitamin (MULTIVITAMIN WITH MINERALS) TABS tablet Take 1 tablet by mouth daily.    . Multiple Vitamins-Minerals (GNP HAIR/SKIN/NAILS PO) Take by mouth.    . Riboflavin 100 MG CAPS Take by mouth.    . vitamin C (ASCORBIC ACID) 500 MG tablet Take 500 mg by mouth daily.     No current facility-administered medications on file prior  to visit.     Cardiovascular studies:  EKG 05/01/2019: Sinus rhythm 97 vpm. Nonspecific T-abnormality.   Echocardiogram 04/28/2019: Normal LV size and thickness. EF 55-60%. Impaired relaxation. Normal RV size and function. No significant valvular abnormality.   CTA chest 05/23/2018: No demonstrable pulmonary embolus. No thoracic aortic aneurysm or dissection. There is aortic atherosclerosis. Right subclavian artery arises distally to the other great vessels, passing posteriorly to the esophagus and impressing upon the esophagus as it passes to the right. No lung edema or consolidation. No evident thoracic adenopathy. Aortic Atherosclerosis (ICD10-I70.0).  CXR 04/17/2019: Mild cardiomegaly without acute abnormality of the lungs.  CTA coronary angiogram 12/2017: pLAD minimal atherosclerosis. Rest normal. Calcium score 6.   Recent labs: Lipid panel 05/28/2019: Cholesterol 175, triglycerides 70, HDL 66, LDL 96.  Lipid panel 10/2015: Chol 184, TG 84, HDL 1813, LDL 84.  Lipid panel 02/2014: Chol 260, TG 73, HDL 59, LDL 193   Review of Systems  Constitution: Negative for decreased appetite, malaise/fatigue, weight gain and weight loss.  HENT: Negative for congestion.   Eyes: Negative for visual disturbance.  Cardiovascular: Negative for chest pain, dyspnea on exertion, leg swelling, palpitations and syncope.  Respiratory: Negative for cough.   Endocrine: Negative for cold intolerance.  Hematologic/Lymphatic: Does not bruise/bleed easily.  Skin: Negative for itching and rash.  Musculoskeletal: Negative for myalgias.  Gastrointestinal: Negative for abdominal pain, nausea and vomiting.       Ulcerative colitis, controlled   Genitourinary: Negative for dysuria.  Neurological: Negative for dizziness and weakness.  Psychiatric/Behavioral: The patient is not nervous/anxious.   All other systems reviewed and are negative.        Vitals:   06/08/19 1029  BP: 136/89   Pulse: (!) 110  Temp: 97.6 F (36.4 C)  SpO2: 98%     Body mass index is 29.29 kg/m. Filed Weights   06/08/19 1029  Weight: 150 lb (68 kg)     Objective:   Physical Exam  Constitutional: She is oriented to person, place, and time. She appears well-developed and well-nourished. No distress.  HENT:  Head: Normocephalic and atraumatic.  Eyes: Pupils are equal, round, and reactive to light. Conjunctivae are normal.  Neck: No JVD present.  Cardiovascular: Intact distal pulses. Tachycardia present.  Pulmonary/Chest: Effort normal and breath sounds normal. She has no wheezes. She has no rales.  Abdominal: Soft. Bowel sounds are normal. There is no rebound.  Musculoskeletal:        General: No edema.  Lymphadenopathy:    She has no cervical adenopathy.  Neurological: She is alert and oriented to person, place, and time. No cranial nerve deficit.  Skin: Skin is warm and dry.  Psychiatric: She has a normal mood and affect.  Nursing note and vitals reviewed.         Assessment & Recommendations:   58 year old female with hyperlipidemia, aortic and coronary atherrosclerosis, presyncope, Graves' disease, status post thyroidectomy, ulcerative colitis, kidney stone and history of acute pancreatitis, migraine,  Presyncope: Likely due to orthostatic hypotension. No recurrence. Essentially normal echocardiogram.  Recommend liberal hydration, compression stockings.  Aortic and coronary atherosclerosis: LDL 96. Recommend increasing crestor to 20 mg daily. Not on Aspirin due to h/o GI bleeding with ulcerative colitis.  Repeat lipid panel and f/u in 6 months.  Nigel Mormon, MD [facility] Pager: [phone] Office: [phone] If no answer Cell [phone]

## 2019-06-10 ENCOUNTER — Other Ambulatory Visit: Payer: BC Managed Care – PPO

## 2019-06-15 ENCOUNTER — Ambulatory Visit
Admission: RE | Admit: 2019-06-15 | Discharge: 2019-06-15 | Disposition: A | Discharge status: Home / Self Care | Payer: BC Managed Care – PPO | Source: Ambulatory Visit | Attending: Family Medicine | Admitting: Family Medicine

## 2019-06-15 ENCOUNTER — Other Ambulatory Visit: Payer: Self-pay

## 2019-06-15 DIAGNOSIS — R590 Localized enlarged lymph nodes: Secondary | ICD-10-CM

## 2019-06-15 DIAGNOSIS — N3289 Other specified disorders of bladder: Secondary | ICD-10-CM | POA: Diagnosis not present

## 2019-06-15 DIAGNOSIS — R0689 Other abnormalities of breathing: Secondary | ICD-10-CM | POA: Diagnosis not present

## 2019-06-15 MED ORDER — IOPAMIDOL (ISOVUE-300) INJECTION 61%
100.0000 mL | Freq: Once | INTRAVENOUS | Status: AC | PRN
  Administered 2019-06-15: 100 mL via INTRAVENOUS

## 2019-06-16 NOTE — Progress Notes (Signed)
Please inform patient of the following:  Good news - her CT scan did not show any causes of swollen lymph nodes. I think they are mostly reactive to her IBS flare as we dicussed during her office visit. Would like to monitor for another week or two and would like for her to let us know if they are not improving.  Danielle Bond. [name], MD 06/16/2019 2:08 PM

## 2019-06-22 ENCOUNTER — Other Ambulatory Visit: Payer: Self-pay

## 2019-06-22 DIAGNOSIS — D235 Other benign neoplasm of skin of trunk: Secondary | ICD-10-CM | POA: Diagnosis not present

## 2019-06-23 DIAGNOSIS — M8589 Other specified disorders of bone density and structure, multiple sites: Secondary | ICD-10-CM | POA: Diagnosis not present

## 2019-06-25 ENCOUNTER — Telehealth: Payer: Self-pay

## 2019-06-25 NOTE — Telephone Encounter (Signed)
Copied from [place] [id number]. Topic: General - Other >> Jun 25, 2019  1:44 PM Leward Quan A wrote: Reason for CRM: Pateint called to ask Dr [name] if it is ok to do a virtual visit on 07/01/2019 instead of coming to the office. Please call patient with an answer at Ph# [phone]

## 2019-06-25 NOTE — Telephone Encounter (Signed)
[facility]

## 2019-07-01 ENCOUNTER — Encounter: Payer: BC Managed Care – PPO | Admitting: Family Medicine

## 2019-07-03 DIAGNOSIS — E039 Hypothyroidism, unspecified: Secondary | ICD-10-CM | POA: Diagnosis not present

## 2019-07-03 DIAGNOSIS — M1990 Unspecified osteoarthritis, unspecified site: Secondary | ICD-10-CM | POA: Diagnosis not present

## 2019-07-03 DIAGNOSIS — M810 Age-related osteoporosis without current pathological fracture: Secondary | ICD-10-CM | POA: Diagnosis not present

## 2019-07-03 DIAGNOSIS — E119 Type 2 diabetes mellitus without complications: Secondary | ICD-10-CM | POA: Diagnosis not present

## 2019-07-08 ENCOUNTER — Other Ambulatory Visit: Payer: Self-pay

## 2019-07-09 ENCOUNTER — Ambulatory Visit: Payer: Self-pay | Admitting: Family Medicine

## 2019-07-09 ENCOUNTER — Encounter: Payer: Self-pay | Admitting: Family Medicine

## 2019-07-09 ENCOUNTER — Ambulatory Visit (INDEPENDENT_AMBULATORY_CARE_PROVIDER_SITE_OTHER): Payer: BC Managed Care – PPO | Admitting: Family Medicine

## 2019-07-09 VITALS — BP 141/86 | HR 101 | Temp 98.0°F | Ht 60.0 in

## 2019-07-09 DIAGNOSIS — K51011 Ulcerative (chronic) pancolitis with rectal bleeding: Secondary | ICD-10-CM | POA: Diagnosis not present

## 2019-07-09 DIAGNOSIS — R109 Unspecified abdominal pain: Secondary | ICD-10-CM

## 2019-07-09 LAB — SEDIMENTATION RATE: Sed Rate: 64 mm/hr — ABNORMAL HIGH (ref 0–30)

## 2019-07-09 LAB — LIPASE: Lipase: 42 U/L (ref 11.0–59.0)

## 2019-07-09 LAB — CK: Total CK: 119 U/L (ref 7–177)

## 2019-07-09 NOTE — Telephone Encounter (Signed)
FYI

## 2019-07-09 NOTE — Progress Notes (Signed)
   Chief Complaint:  Danielle Bond is a 58 y.o. female who presents today with a chief complaint of back pain.   Assessment/Plan:  Back Pain Seems to be reaction she is having to her Humira. She is not due for her next injection for another couple of weeks. Advised her to discuss with GI to see if there are any other alternatives. Recommended that she take Benadryl for the next couple of days to help alleviate any allergic component of her reaction. She has had bad reactions of prednisone in the past and we will avoid this today. We will check labs including CK, lipase, and ESR. Discussed reasons to return to care and seek emergent care.  Ulcerative colitis See above. Seems like she is no longer able to tolerate Humira. Advised her to follow-up with GI soon to discuss alternative options.    Subjective:  HPI:  Back pain Started 2 days ago after giving herself her injection of Humira. She had a similar reaction happen a couple of weeks ago with her last injection as well. She had a local reaction at the site of injection, however has had additional symptoms including abdominal bloating/distention, right ankle pain/locking, and right shoulder pain and locking as well. She has not slept much at all since her injection. She is worried about possibility of pancreatitis as it feels similar to past episodes. Pain is located in the right side of her abdomen and radiates into her back. No reported shortness of breath or wheezing. No fevers or chills. No treatments tried.  ROS: Per HPI  PMH: She reports that she has never smoked. She has never used smokeless tobacco. She reports that she does not drink alcohol or use drugs.      Objective:  Physical Exam: BP (!) 141/86   Pulse (!) 101   Temp 98 F (36.7 C)   Ht 5' (1.524 m)   LMP 03/03/2013   SpO2 97%   BMI 29.29 kg/m   Gen: NAD, resting comfortably CV: Regular rate and rhythm with no murmurs appreciated Pulm: Normal work of breathing,  clear to auscultation bilaterally with no crackles, wheezes, or rhonchi GI: Distended. Mildly tender to palpation. Bowel sounds present. MSK: Tender to palpation along right shoulder and right back     [facility]. [name], MD 07/09/2019 8:13 AM

## 2019-07-09 NOTE — Telephone Encounter (Signed)
Pt called in c/o having a "locked right ankle" and "my shoulders feel heavy and my arms after giving myself my Humara shot yesterday in my right thigh".  "This happened 3 weeks ago and I spoke with my ambassador at the company".    "They didn't do anything" when I asked her what did they say/do.   This episode is worse.   "I have not slept all night because I've been hurting".    She called in wanting to cancel her 8:00 AM appt this morning.   She refused to go to the ED so I encouraged her to keep kher 8:00 appt if she is not going to go to the ED.  She was agreeable to this.  She may be 5-10 minutes late arriving however she can get there.    "I only live 10 minutes away".    "It will take me extra time to get my clothes on because I feel so stiff".     She said this appt is for a physical however since she is refusing to go to the ED and was willing to come on in this morning I encouraged her to please come in and at least be seen for her issues.     See triage notes.  I sent my notes to Dr. Marigene Ehlers office high priority.   Reason for Disposition . [1] Caller has URGENT medication question about med that PCP or specialist prescribed AND [2] triager unable to answer question  Answer Assessment - Initial Assessment Questions 1.   NAME of MEDICATION: "What medicine are you calling about?"     I have a physical this morning at 8:00.    I gave myself my Humara last night.   My abd feels like a rock on it.    I gave my injection in upper thigh on right.     Right ankle locked and my upper shoulders.   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   This happened 3 weeks ago and I called the company.   This time it's worse.  I have not slept at all.    I have acute pancreatitis and it hurts in the upper right side of my stomach.    I'm not going to the ED.      2.   QUESTION: "What is your question?"     You yourself a Humara shot last night in your right thigh.    You have been hurting all night and have not  slept.   Your right ankle is "locked up" and your shoulders are hurting/stiff/hard to move.    "My arms feel like bricks that feel heavy".    My abd on the right upper side "feels like a brick there".  3.   PRESCRIBING HCP: "Who prescribed it?" Reason: if prescribed by specialist, call should be referred to that group.     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.    This same thing happened 3 weeks ago but not as bad as this time.    "When I called them when it happened before they didn't do anything I just reported it". 4. SYMPTOMS: "Do you have any symptoms?"     See above 5. SEVERITY: If symptoms are present, ask "Are they mild, moderate or severe?"     Severe 6.  PREGNANCY:  "Is there any chance that you are pregnant?" "When was your last menstrual period?"     Not asked due to age.  Protocols used: MEDICATION QUESTION CALL-A-AH

## 2019-07-09 NOTE — Patient Instructions (Signed)
It was very nice to see you today!  We will check blood work today.  Please talk with your GI doctor about alternatives to Humira.  You can take Benadryl 50 mg 3 times daily as needed but be aware it will make you drowsy.  Take care, Dr [name]  Please try these tips to maintain a healthy lifestyle:   Eat at least 3 REAL meals and 1-2 snacks per day.  Aim for no more than 5 hours between eating.  If you eat breakfast, please do so within one hour of getting up.    Each meal should contain half fruits/vegetables, one quarter protein, and one quarter carbs (no bigger than a computer mouse)   Cut down on sweet beverages. This includes juice, soda, and sweet tea.     Drink at least 1 glass of water with each meal and aim for at least 8 glasses per day   Exercise at least 150 minutes every week.

## 2019-07-10 NOTE — Progress Notes (Signed)
Please inform patient of the following:  Sed rate is elevated, but no signs of pancreatitis or muscle breakdown. Recommend that she not take any further doses of humira until she talks with her GI doctor.

## 2019-07-13 ENCOUNTER — Telehealth: Payer: Self-pay

## 2019-07-13 NOTE — Telephone Encounter (Signed)
Telephone encounter:  Reason for call: Pt called wanting to know if she can go back on crestor 10 mg she is getting lightheaded and feeling sick on the 20 mg  Usual provider: MP  Last office visit: 06/08/2019  Next office visit: 12/07/2019   Last hospitalization:    Current Outpatient Medications on File Prior to Visit  Medication Sig Dispense Refill  . Albuterol Sulfate (PROAIR RESPICLICK) 209 (90 Base) MCG/ACT AEPB Inhale 1 puff into the lungs every 4 (four) hours as needed. 1 each 0  . BREO ELLIPTA 100-25 MCG/INH AEPB TAKE 1 PUFF BY MOUTH EVERY DAY 180 each 1  . Cholecalciferol (VITAMIN D3) 25 MCG (1000 UT) CAPS Take 1 capsule by mouth daily.    Marland Kitchen ELDERBERRY PO Take 1 tablet by mouth daily.    . folic acid (FOLVITE) 1 MG tablet Take 1 mg by mouth daily.    Marland Kitchen HUMIRA PEN 40 MG/0.4ML PNKT every 14 (fourteen) days.     Marland Kitchen levothyroxine (SYNTHROID, LEVOTHROID) 112 MCG tablet Take 112 mcg by mouth daily before breakfast.    . Multiple Vitamin (MULTIVITAMIN WITH MINERALS) TABS tablet Take 1 tablet by mouth daily.    . Multiple Vitamins-Minerals (GNP HAIR/SKIN/NAILS PO) Take by mouth.    . Riboflavin 100 MG CAPS Take by mouth.    . rosuvastatin (CRESTOR) 20 MG tablet Take 1 tablet (20 mg total) by mouth daily. 90 tablet 1  . vitamin C (ASCORBIC ACID) 500 MG tablet Take 500 mg by mouth daily.     No current facility-administered medications on file prior to visit.

## 2019-07-13 NOTE — Telephone Encounter (Signed)
Yes. Okay with me.

## 2019-07-13 NOTE — Telephone Encounter (Signed)
LVM with details pt may call back

## 2019-07-13 NOTE — Telephone Encounter (Signed)
Copied from [place] [phone]. Topic: General - Call Back - No Documentation >> Jul 13, 2019 11:38 AM Erick Blinks wrote: Reason for CRM: Pt is requesting a nurse call back to discuss lab results, please advise  Best contact: [phone]

## 2019-07-14 ENCOUNTER — Telehealth: Payer: Self-pay | Admitting: Family Medicine

## 2019-07-14 ENCOUNTER — Other Ambulatory Visit: Payer: Self-pay

## 2019-07-14 MED ORDER — PROAIR RESPICLICK 108 (90 BASE) MCG/ACT IN AEPB: 1.0000 | INHALATION_SPRAY | RESPIRATORY_TRACT | 3 refills | Status: DC | PRN

## 2019-07-14 NOTE — Telephone Encounter (Signed)
Rx sent 

## 2019-07-14 NOTE — Telephone Encounter (Signed)
See note

## 2019-07-14 NOTE — Telephone Encounter (Signed)
Pt is requesting a refill for Albuterol Sulfate ([place]) 496 (90 Base) MCG/ACT AEPB    Pharmacy:  [id number] [place]  [phone] Fax:  [phone]

## 2019-07-14 NOTE — Telephone Encounter (Signed)
Notified of lab results and voices understanding.

## 2019-07-22 DIAGNOSIS — Z8719 Personal history of other diseases of the digestive system: Secondary | ICD-10-CM | POA: Diagnosis not present

## 2019-07-23 ENCOUNTER — Telehealth: Payer: Self-pay | Admitting: Internal Medicine

## 2019-07-23 MED ORDER — ALBUTEROL SULFATE HFA 108 (90 BASE) MCG/ACT IN AERS: 2.0000 | INHALATION_SPRAY | Freq: Four times a day (QID) | RESPIRATORY_TRACT | 1 refills | Status: DC | PRN

## 2019-07-23 NOTE — Telephone Encounter (Signed)
Spoke with the pt  She is requesting a refill on her proair  Rx sent  Nothing further needed

## 2019-07-27 ENCOUNTER — Other Ambulatory Visit: Payer: Self-pay | Admitting: Emergency Medicine

## 2019-07-27 MED ORDER — ALBUTEROL SULFATE HFA 108 (90 BASE) MCG/ACT IN AERS: 2.0000 | INHALATION_SPRAY | Freq: Four times a day (QID) | RESPIRATORY_TRACT | 3 refills | Status: DC | PRN

## 2019-08-05 DIAGNOSIS — K5190 Ulcerative colitis, unspecified, without complications: Secondary | ICD-10-CM | POA: Diagnosis not present

## 2019-08-12 DIAGNOSIS — E119 Type 2 diabetes mellitus without complications: Secondary | ICD-10-CM | POA: Diagnosis not present

## 2019-08-12 DIAGNOSIS — E7800 Pure hypercholesterolemia, unspecified: Secondary | ICD-10-CM | POA: Diagnosis not present

## 2019-08-12 DIAGNOSIS — E890 Postprocedural hypothyroidism: Secondary | ICD-10-CM | POA: Diagnosis not present

## 2019-08-12 DIAGNOSIS — M810 Age-related osteoporosis without current pathological fracture: Secondary | ICD-10-CM | POA: Diagnosis not present

## 2019-08-24 ENCOUNTER — Telehealth: Payer: Self-pay | Admitting: Internal Medicine

## 2019-08-24 DIAGNOSIS — R0981 Nasal congestion: Secondary | ICD-10-CM | POA: Diagnosis not present

## 2019-08-24 DIAGNOSIS — Z20822 Contact with and (suspected) exposure to covid-19: Secondary | ICD-10-CM | POA: Diagnosis not present

## 2019-08-25 ENCOUNTER — Other Ambulatory Visit: Payer: Self-pay

## 2019-08-25 ENCOUNTER — Telehealth: Payer: Self-pay | Admitting: Family Medicine

## 2019-08-25 MED ORDER — FLUTICASONE PROPIONATE 50 MCG/ACT NA SUSP: 2.0000 | Freq: Every day | NASAL | 2 refills | Status: DC | PRN

## 2019-08-25 NOTE — Telephone Encounter (Signed)
Patient called in saying that she had a covid test done yesterday because she was having some sinus issues and would like to know if she could be prescribed Flonase to help with her issues.

## 2019-08-25 NOTE — Telephone Encounter (Signed)
Rx  sent left detailed message to pt

## 2019-08-25 NOTE — Telephone Encounter (Signed)
Error

## 2019-09-09 ENCOUNTER — Telehealth: Payer: Self-pay | Admitting: Internal Medicine

## 2019-09-09 ENCOUNTER — Telehealth (INDEPENDENT_AMBULATORY_CARE_PROVIDER_SITE_OTHER): Payer: BC Managed Care – PPO | Admitting: Internal Medicine

## 2019-09-09 ENCOUNTER — Encounter: Payer: Self-pay | Admitting: Internal Medicine

## 2019-09-09 DIAGNOSIS — R053 Chronic cough: Secondary | ICD-10-CM

## 2019-09-09 DIAGNOSIS — J387 Other diseases of larynx: Secondary | ICD-10-CM

## 2019-09-09 DIAGNOSIS — R05 Cough: Secondary | ICD-10-CM | POA: Diagnosis not present

## 2019-09-09 NOTE — Telephone Encounter (Signed)
Pt had mychart video visit today 2/17 with MR.  Instructions are for pt to follow up in 3-4 weeks with either MR or an APP. MR also stated to change pt from [facility] to either Symbicort 160 or Dulera 200.  Attempted to call pt to discuss this info with her and to also get her f/u appt scheduled but unable to reach. Left message for pt to return call.

## 2019-09-09 NOTE — Patient Instructions (Addendum)
ICD-10-CM   1. Chronic cough  R05   2. Irritable larynx  J38.7    The conditioning of his chronic refractory cough There are a lot of features to suggest this is cough neuropathy otherwise call irritable larynx syndrome CT scan of the chest November 2020 lung fields look clear and is reassuring that you do not have opportunistic infection because of Humira  Plan -Change Breo to Symbicort or Dulera high-dose 2 puffs 2 times daily -Every time you have the urge to cough drink water or suck on sugarless lozenge -Take 2-3 days of complete voice rest while you are not whispering or talking -Continue Flonase and nasal lavage -Make sure there is no mildew or mold in the humidifier or in the bathroom  Follow-up -In 3-4 weeks either with myself or nurse practitioner with improvement -If no improvement then will consider a course of gabapentin and/or referral to voice rehabilitation -Depending on the course of bronchoscopy with lavage to rule out opportunistic infection is something to be kept in mind

## 2019-09-09 NOTE — Progress Notes (Signed)
PCP Jani Gravel, MD  Neurologist-Dr. Krista Blue Infectious diseases Dr. Michel Bickers Orthopedic for right shoulder-Dr. Hildred Priest 06/18/2018  Chief Complaint  Patient presents with  . Pulmonary Consult    asthma, [name], NP referred her, she went to the ER and she had bronchitis with fever, she has constant fever and she has seen Infectious DIsease for this, she has a dry hacking cough    59 year old female with a chronic complex set of medical problems that includes hyperlipidemia, Graves' disease, status post thyroidectomy, ulcerative colitis, kidney stone and history of acute pancreatitis.  She also has long history of migraine headaches.  She presents with her husband for respiratory issues in the context of fever of unknown origin and bloody stools  She tells me that she was diagnosed with ulcerative colitis in [place] in 2007 when the used to live [place].  In 2013 she migrated to [place], [place] and had a bad flareup of ulcerative colitis for which she was admitted.  Since then she has been under the care of Dr. Collene Mares gastroenterologist and she has been on Lialda.  Overall she is doing really wel0 .then some few years ago she was started on Brio for diagnosis of asthma.  Details of this is unclear.  Overall doing well since then till September 2019 when she felt the Lialda stopped working on a subjective basis.  This is because since then every time she takes the Lialda she has abdominal pain.  She also has fever at night on a daily basis.  She also has bloody stools at least 7 times a day.  She has abdominal pain when she takes the [place].  Per her report Dr. Collene Mares has done a colonoscopy with biopsy that showed inflammation.  Therefore biologic is being considered.  However she is also increasing fatigue and shortness of breath associated with it.  Is mostly present on exertion relieved by rest.  Is also wheezing.  Overall she has had a significant deterioration.  Then  towards the end of October 2019 she felt she had an asthma flareup and reported to the urgent care center where she was treated acutely.  But she represented within a week or 10 days to the ER and was given a diagnosis of acute bronchitis.  CT angiogram ruled out pulmonary embolism.  All these records have been reviewed by me and the images and results visualized.  She subsequently has seen Dr. Michel Bickers infectious diseases this week.  I reviewed his findings.  He is worried that the Lialda is causing eosinophilia.  He does not suspect infectious disease as a reason for the fever quantified on gold and HIV are negative.  Further blood results are pending.  Patient says she has had an extensive work-up.  She is also seen [facility] rheumatology department Dr. Joellyn Rued who apparently has done an extensive autoimmune panel and this is all negative.  Unclear is if vasculitis work-up has been done that includes ANCA and GBM.  Patient is really frustrated by the whole constellation of symptoms.  At this point in time a biologic agent is being considered for ulcerative colitis but apparently the fever is being considered is unusual in the setting of ulcerative colitis and therefore she is going through the work-up  Exam nitric oxide is 21 ppb and this is on Breo today and is normal.  Recent labs June 12, 2018 done at [facility] rheumatology: Sjogren antibody centromere antibody, Denice Paradise  1 antibody, chromatin antibody, SSA, SSB, anti-scleroderma antibody, Smith antibody, double-stranded DNA antibody and RNP antibody and ESR all normal.  TSH is normal.  Hemoglobin is 12 g%.  Your sniffles continue to be high at 13.6% of her total white count of 7400.  Creatinine is normal.  Urine analysis appears normal.  Results for SHATERRICA, TERRITO (MRN [id number]) as of 06/18/2018 09:18  Ref. Range 11/27/2013 12:40 03/10/2014 18:52 03/16/2014 16:42 05/23/2018 05:31 06/16/2018 15:10  Eosinophils  Absolute Latest Ref Range: 15 - 500 cells/uL 0.2 0.4 0.2 0.6 (H) 385   Results for GUELDA, BATSON (MRN [id number]) as of 06/18/2018 09:18  Ref. Range 10/05/2013 14:36 03/16/2014 16:42  Anti Nuclear Antibody(ANA) Latest Ref Range: NEGATIVE  POS (A)   ANA Pattern 1 Unknown HOMOGENOUS (A)   ANA Titer 1 Latest Ref Range: <1:40   1:1280 (H)   ds DNA Ab Latest Units: IU/mL  1  C3 Complement Latest Ref Range: 90 - 180 mg/dL  116  C4 Complement Latest Ref Range: 10 - 40 mg/dL  18   Most recent CT scan of the chest angiogram May 23, 2018: Personally visualized.  Lung fields look fairly benign.  OV 07/29/2018  Subjective:  Patient ID: [name], female , DOB: 11-19-1960 , age 59 y.o. , MRN: [id number] , ADDRESS: [address] [place] [address]   07/29/2018 -   Chief Complaint  Patient presents with  . Follow-up    Pt states she is doing better since last visit. States she still has an occ dry cough but nothing like last visit. Pt states she does have some SOB/chest tightness today due to the weather.     HPI Kimberleigh Mehan 59 y.o. -presents with her husband for chronic cough and fever of unknown origin in the setting of ulcerative colitis and Lialda treatment  After her last visit we reviewed autoimmune panel from primary rheumatologist and this was negative.  We performed vasculitis panel and this was negative.  In the interim she is seen infectious disease specialist Dr. Megan Salon and apparently no infection present for her fever of unknown origin.  Discussions were held with Dr. Collene Mares.  She also discussed with a gastroenterologist at [facility] and the thought process was that maybe the fevers are indeed coming from ulcerative colitis although this is very rare.  This is because autoimmune, vasculitis and infectious work-up was negative.  On July 01, 2018 patient's Lialda was stopped and she has been prescribed budesonide oral treatment.  Subsequently after this within  a few weeks her cough is significantly improved.  She not having any more night sweats other than one at night.  This is 80% better.  And she is also feeling a lot better.  Apparently starting Entyvio biologic for ulcerative colitis is in discussion but she is somewhat got trepidation about this given the fact it is an infusion.  She will meet with Dr. Collene Mares about this.  Overall at this point she is feeling well no respiratory complaints other than the mild cough.  She is asking for referral to primary care physician.  Her current primary care physician is apparently out of the office for an extended period of time not otherwise specified.   OV 04/09/2019 - chronic cough and fever of unknown origin in the setting of ulcerative colitis and Lialda treatment   Subjective:  Patient ID: [name], female , DOB: 1961/05/08 , age 66 y.o. , MRN: [id number] , ADDRESS: [address] [place]  [id number]   04/09/2019 -     HPI Sephora Boyar 59 y.o. - cough was resolved. Now back 1.5 weeks ago. Dry cough. Hacky cough. Sometimes she does bring sputum. Annoying. Has on and off migraine headaches. No fever, No hemoptysis . No anosmia. No ageusia. No covid exposures but daughter had strep throat 2 weeks ago (confirmed strep with confirmed negative covid). No sore throat. Throat does feel itchy.  Does NOT want to do antibiotics. Doing low risk covid activities only  Re UC - doing Humira by Dr Therisa Doyne since October 01, 2018 - q 2 weeks. .  Fevers went away in dec 2019. UC doing ok  OV 04/16/2019  Subjective:  Patient ID: [name], female , DOB: Oct 09, 1960 , age 25 y.o. , MRN: [id number] , ADDRESS: [address] [place] [address]   04/16/2019 -   Chief Complaint  Patient presents with  . Follow-up    Pt c/o prod cough with yellow mucus x 2 weeks. Pt also c/o left sided discomfort on lateral chest with inspiration. Pt states due to the discomfort she feels she cant take a full breath in. Pt  denies f/c/s and swelling.    Acute visit  HPI Breniyah Romm 59 y.o. -is here for acute visit.  Originally seen in  Nov2019 for fever of unknown origin.  At that time ulcerative colitis was suspected along with Lialda because she had some eosinophilia.  She has been on Breo.  She tells me that her fever and cough broke and it was unrelated to stopping Lialda.  She then switched gastroenterologist and has been on care with Dr. Therisa Doyne.  She is on Humira but she tells me the fever and cough stopped even before she went on Humira.  She had been doing well.  Approximately 3 weeks ago or so daughter developed strep throat [apparently COVID negative].  Then approximately 2 weeks ago she developed a cough.  We did do a telephone visit.  At that time she opted for expectant follow-up.  She is here acutely because the cough is persistent.  She also has some yellow sputum.  A few days ago she did feel like a muscle pull in the left infra-axillary area with the cough.  This is resolved.  Early this morning she had very mild symptoms of the same.  Therefore she is here but there is no wheezing or shortness of breath.  There is no pleuritic chest pain.  There is no hemoptysis no pedal edema.  She continues with the Brio.  We did do a COVID antigen testing/PCR testing and this was negative on 10 April 2019.  In May 2020 her COVID serology for IgG was negative.  She is interested in a chest x-ray and empiric treatment with antibiotics.  We are unable to do an exhaled nitric oxide test because of the national [place] emergency and local site restrictions.   COVID negative 04/10/2019 - negtaive Results for GRACIA, SAGGESE (MRN [id number]) as of 04/16/2019 15:48  Ref. Range 12/17/2018 09:18  SARS CoV2 AB IGG Unknown NEGATIVE    Results for RYLLIE, NIELAND (MRN [id number]) as of 04/16/2019 15:48  Ref. Range 06/18/2018 10:17 06/26/2018 01:15 06/26/2018 14:29  Eosinophils Absolute Latest Ref Range: 15 - 500 cells/uL 0.9 (H) 0.4  535 (H)  Results for SAVREEN, GEBHARDT (MRN [id number]) as of 04/16/2019 15:48  Ref. Range 06/18/2018 10:17  IgE (Immunoglobulin E), Serum Latest Ref Range: <OR=114 kU/L 74   Results for SHAWNTINA, DIFFEE (MRN [id number])  as of 04/16/2019 15:48  Ref. Range 08/15/2012 19:14 10/05/2013 14:36 03/16/2014 16:42 06/26/2018 14:29 07/02/2018 10:42  Anti Nuclear Antibody (ANA) Latest Ref Range: Negative   POS (A)   Negative  ANA Pattern 1 Unknown  HOMOGENOUS (A)     ANA Titer 1 Latest Ref Range: <1:40    1:1280 (H)     ds DNA Ab Latest Units: IU/mL   1    GBM Ab Latest Units: AI    <1.0 <1.0  Mitochondrial M2 Ab, IgG Latest Ref Range: <0.91  1.14 (H)      Myeloperoxidase Abs Latest Units: AI     <1.0  Serine Protease 3 Latest Units: AI     <1.0  S cerevisiae IgG ab Latest Ref Range: <=20.0 Units 12.7      S cerevisiae IgA ab Latest Ref Range: <=20.0 Units 18.3        Ov 05/21/19 -  video visit is for an acute office visit.  Patient complains over the last 2 weeks that she has had sinus pain and pressure especially on the right side ear pressure sore throat and swollen glands on the right side of her neck.  She says she has fatigue pain and pressure when she bends forward. Patient was seen last visit for an acute bronchitis.  Patient was treated with doxycycline x5 days.  Patient says that her symptoms did improve her cough and congestion are better. Chest x-ray last month showed clear lungs .  COVID-19 testing was negative Patient is on Breo for her asthma.  Says her breathing has been doing fine with no wheezing or shortness of breath. She denies any fever, chest pain, orthopnea, edema, rash, loss of taste or smell, body aches.  Rx zpak  OV 09/09/2019  Subjective:  Patient ID: [name], female , DOB: [birth year] , age 70 y.o. , MRN: [id number] , ADDRESS: [address] [place] [address]   09/09/2019 -chief complaint of cough.  Patient is on immunosuppressive for ulcerative  colitis.   HPI Fredda Clarida 59 y.o. -does a video visit.  The limits, risks and benefits of video visit were explained.  Patient was identified.  She tells me that she still continues her Breo and nasal treatment.  She is on Humira.  All symptoms of ulcerative colitis are resolved but ever since September 2020 the cough still persists.  She is clearing her throat.  Cough is present day and night with daytime more.  She feels the scratchy sensation in her throat area.  There is a tickle sensation as well.  Is extremely annoying.  There is no fever.  She had a CT scan of the chest November 2020 that I personally visualized and interpreted the lung fields look clear and I agree with the formal report.     ROS - per HPI     has a past medical history of Asthma, Chronic kidney disease, pancreatitis (06/16/2018), Kidney stone, Kidney stone, Migraine, Orthostatic hypotension, Osteopenia (03/31/2012), and Ulcerative colitis ([place]) (03/31/2012).   reports that she has never smoked. She has never used smokeless tobacco.  Past Surgical History:  Procedure Laterality Date  . CESAREAN SECTION     x3  . CHOLECYSTECTOMY    . COLONOSCOPY    . LITHOTRIPSY    . THYROIDECTOMY     1997    Allergies  Allergen Reactions  . Almond Oil Hives and Shortness Of Breath  . Lialda [Mesalamine] Other (See Comments)    Vomiting and rectal  bleeding  . Other Anaphylaxis    Almonds, tree nuts  . Aspirin Hives and Nausea Only    Stomach cramps  . Hydromorphone Hcl Other (See Comments)    Caused Enzyme levels to go up  . Penicillins Hives and Swelling    Has patient had a PCN reaction causing immediate rash, facial/tongue/throat swelling, SOB or lightheadedness with hypotension: Yes Has patient had a PCN reaction causing severe rash involving mucus membranes or skin necrosis: No Has patient had a PCN reaction that required hospitalization: No Has patient had a PCN reaction occurring within the last 10 years:  No  Stomach cramps If all of the above answers are "NO", then may proceed with Cephalosporin use.   . Acetaminophen Other (See Comments)    Has autoimmune hepatitis, wants to avoid APAP  . Apriso [Mesalamine Er] Nausea And Vomiting  . Corticosteroids Other (See Comments)    Had acute pancreatitis x 2 after steroid use.  . Imitrex [Sumatriptan] Swelling    Injection caused swelling  . Stadol [Butorphanol] Other (See Comments)    hallucinates  . Topamax [Topiramate] Other (See Comments)    Kidney stones  . Vancomycin Hives  . Azathioprine Rash  . Pepcid [Famotidine] Nausea And Vomiting    Makes acid worse  . Prednisone Nausea And Vomiting    All steroids causes nausea and pancreatitis     Immunization History  Administered Date(s) Administered  . Influenza Inj Mdck Quad Pf 03/23/2018  . Influenza Inj Mdck Quad With Preservative 05/01/2019  . Influenza Split 04/10/2012, 04/24/2013  . Influenza, Seasonal, Injecte, Preservative Fre 04/15/2014  . PPD Test 10/05/2013  . Pneumococcal Polysaccharide-23 04/22/2008, 04/23/2013  . Tdap 07/23/2004, 07/22/2012  . Varicella 07/22/2012    Family History  Problem Relation Age of Onset  . Cervical cancer Paternal Aunt   . Heart disease Paternal Grandmother   . Heart disease Mother   . Dementia Mother   . Thyroid disease Mother   . Osteoarthritis Mother   . Asthma Mother   . Heart attack Father   . Breast cancer Cousin   . Heart attack Brother   . Heart disease Brother      Current Outpatient Medications:  .  albuterol (PROAIR HFA) 108 (90 Base) MCG/ACT inhaler, Inhale 2 puffs into the lungs every 6 (six) hours as needed for wheezing or shortness of breath., Disp: 18 g, Rfl: 3 .  BREO ELLIPTA 100-25 MCG/INH AEPB, TAKE 1 PUFF BY MOUTH EVERY DAY, Disp: 180 each, Rfl: 1 .  Cholecalciferol (VITAMIN D3) 25 MCG (1000 UT) CAPS, Take 1 capsule by mouth daily., Disp: , Rfl:  .  ELDERBERRY PO, Take 1 tablet by mouth daily., Disp: , Rfl:   .  fluticasone (FLONASE) 50 MCG/ACT nasal spray, Place 2 sprays into both nostrils daily as needed for allergies or rhinitis., Disp: 16 g, Rfl: 2 .  folic acid (FOLVITE) 1 MG tablet, Take 1 mg by mouth daily., Disp: , Rfl:  .  HUMIRA PEN 40 MG/0.4ML PNKT, every 14 (fourteen) days. , Disp: , Rfl:  .  levothyroxine (SYNTHROID, LEVOTHROID) 112 MCG tablet, Take 112 mcg by mouth daily before breakfast., Disp: , Rfl:  .  Multiple Vitamin (MULTIVITAMIN WITH MINERALS) TABS tablet, Take 1 tablet by mouth daily., Disp: , Rfl:  .  Multiple Vitamins-Minerals (GNP HAIR/SKIN/NAILS PO), Take by mouth., Disp: , Rfl:  .  Riboflavin 100 MG CAPS, Take by mouth., Disp: , Rfl:  .  rosuvastatin (CRESTOR) 20 MG tablet, Take  1 tablet (20 mg total) by mouth daily., Disp: 90 tablet, Rfl: 1 .  vitamin C (ASCORBIC ACID) 500 MG tablet, Take 500 mg by mouth daily., Disp: , Rfl:       Objective:   There were no vitals filed for this visit.  Estimated body mass index is 29.29 kg/m as calculated from the following:   Height as of 07/09/19: 5' (1.524 m).   Weight as of 06/08/19: 150 lb (68 kg).  _0 @  There were no vitals filed for this visit.   Physical Exam Video visit -looks in no distress . Cheerful and pleasant       Assessment:       ICD-10-CM   1. Chronic cough  R05   2. Irritable larynx  J38.7        Plan:     Patient Instructions     ICD-10-CM   1. Chronic cough  R05   2. Irritable larynx  J38.7    The conditioning of his chronic refractory cough There are a lot of features to suggest this is cough neuropathy otherwise call irritable larynx syndrome CT scan of the chest November 2020 lung fields look clear and is reassuring that you do not have opportunistic infection because of Humira  Plan -Change Breo to Symbicort or Dulera high-dose 2 puffs 2 times daily -Every time you have the urge to cough drink water or suck on sugarless lozenge -Take 2-3 days of complete voice rest  while you are not whispering or talking -Continue Flonase and nasal lavage -Make sure there is no mildew or mold in the humidifier or in the bathroom  Follow-up -In 3-4 weeks either with myself or nurse practitioner with improvement -If no improvement then will consider a course of gabapentin and/or referral to voice rehabilitation -Depending on the course of bronchoscopy with lavage to rule out opportunistic infection is something to be kept in mind  (Level 04: Estb 30-39 min   visit type: video virtual visit visit spent in total care time and counseling or/and coordination of care by this undersigned MD - Dr Brand Males. This includes one or more of the following on this same day 09/09/2019: pre-charting, chart review, note writing, documentation discussion of test results, diagnostic or treatment recommendations, prognosis, risks and benefits of management options, instructions, education, compliance or risk-factor reduction. It excludes time spent by the [place] or office staff in the care of the patient . Actual time is 30 min)    SIGNATURE    Dr. Brand Males, M.D., F.C.C.P,  Pulmonary and Critical Care Medicine Staff Physician, [place] Director - Interstitial Lung Disease  Program  Pulmonary [place] at [place], [place], [address]  Pager: [phone], If no answer or between  15:00h - 7:00h: call [phone] Telephone: [phone]  11:06 AM 09/09/2019

## 2019-09-14 MED ORDER — BUDESONIDE-FORMOTEROL FUMARATE 160-4.5 MCG/ACT IN AERO: 2.0000 | INHALATION_SPRAY | Freq: Two times a day (BID) | RESPIRATORY_TRACT | 5 refills | Status: DC

## 2019-09-14 NOTE — Telephone Encounter (Signed)
Pt returning call. Pt can be reached at [phone].

## 2019-09-14 NOTE — Telephone Encounter (Signed)
Spoke with the pt and explained the recommendations stated by MR  She verbalized understanding  She would like to try the symbicort  At first she stated that she preferred to see MR at his next available close to 4 wks  I have scheduled this for 11/16/19 per pt request bc she wanted to "wait for him"- appts offered the wk of 11/02/19 were declined by pt  She then changed her mind and wanted to see a APP instead in 4 wks  I offered appt with Aaron Edelman  She became offensive bc I offered appt with APP who did not know her  She has never seen APP here but had sick call handled by TP and preferred to see her   I scheduled her with TP 10/07/19 video appt per her request  When I asked to verify her pharmacy she states "the one on file"- had to ask her again to verify the pharm so we could send rx to correct pharm  She did verify the pharm and I have sent in her symbicort 160  Nothing further needed

## 2019-09-16 DIAGNOSIS — M79644 Pain in right finger(s): Secondary | ICD-10-CM | POA: Diagnosis not present

## 2019-09-16 DIAGNOSIS — K5190 Ulcerative colitis, unspecified, without complications: Secondary | ICD-10-CM | POA: Diagnosis not present

## 2019-09-16 DIAGNOSIS — M7531 Calcific tendinitis of right shoulder: Secondary | ICD-10-CM | POA: Diagnosis not present

## 2019-09-16 DIAGNOSIS — M810 Age-related osteoporosis without current pathological fracture: Secondary | ICD-10-CM | POA: Diagnosis not present

## 2019-09-28 ENCOUNTER — Other Ambulatory Visit: Payer: Self-pay | Admitting: Cardiology

## 2019-10-07 ENCOUNTER — Encounter: Payer: Self-pay | Admitting: Adult Health

## 2019-10-07 ENCOUNTER — Telehealth (INDEPENDENT_AMBULATORY_CARE_PROVIDER_SITE_OTHER): Payer: BC Managed Care – PPO | Admitting: Adult Health

## 2019-10-07 DIAGNOSIS — J4530 Mild persistent asthma, uncomplicated: Secondary | ICD-10-CM

## 2019-10-07 DIAGNOSIS — R05 Cough: Secondary | ICD-10-CM

## 2019-10-07 DIAGNOSIS — R053 Chronic cough: Secondary | ICD-10-CM

## 2019-10-07 NOTE — Patient Instructions (Addendum)
Delsym 2 tsp Twice daily  As needed  Cough .  Symbicort 2 puffs Twice daily  , rinse after use.  Saline nasal rinses As needed   Follow-up with Dr. Chase Caller in 3 months and and as needed Please contact office for sooner follow up if symptoms do not improve or worsen or seek emergency care

## 2019-10-07 NOTE — Progress Notes (Signed)
Virtual Visit via Video Note  I connected with Danielle Bond on 10/07/19 at 10:00 AM EDT by a video enabled telemedicine application and verified that I am speaking with the correct person using two identifiers.  Location: Patient: Home  Provider: Office    I discussed the limitations of evaluation and management by telemedicine and the availability of in person appointments. The patient expressed understanding and agreed to proceed.  History of Present Illness: 59 year old female never smoker followed for asthma, eosinophilia (possibly secondary to [place] )  Medical history significant for Graves' disease, ulcerative colitis (now on Humira) and pancreatitis Today's video visit is for 4 week follow up for asthma and chronic cough .  Last visit patient was having increased cough.  She was changed from [facility] to Symbicort.  She says she is feeling better.  Feels that she can take in a clear breath and also cough has decreased.  She is practicing vocal cord relaxation techniques.  Feels that this is also helping.  She is using saline nasal rinses and this also seems to decrease her nasal congestion and postnasal drip.  She says overall she is feeling better.  Patient Active Problem List   Diagnosis Date Noted  . Atherosclerosis of native coronary artery of native heart without angina pectoris 06/08/2019  . Aortic atherosclerosis ([place]) 06/08/2019  . Mixed hyperlipidemia 05/02/2019  . Postural dizziness with presyncope 05/02/2019  . HSV-2 (herpes simplex virus 2) infection 11/04/2018  . Dyslipidemia 08/15/2018  . Hypothyroidism s/p total thyroidectomy 1997 08/15/2018  . Autoimmune hepatitis ([place]) 08/16/2012  . Asthma 03/31/2012  . Ulcerative colitis ([place]) 03/31/2012  . History of renal calculi 03/31/2012  . Migraines 03/31/2012  . Mitral valve prolapse    Current Outpatient Medications on File Prior to Visit  Medication Sig Dispense Refill  . albuterol (PROAIR HFA) 108 (90 Base) MCG/ACT  inhaler Inhale 2 puffs into the lungs every 6 (six) hours as needed for wheezing or shortness of breath. 18 g 3  . budesonide-formoterol (SYMBICORT) 160-4.5 MCG/ACT inhaler Inhale 2 puffs into the lungs 2 (two) times daily. 1 Inhaler 5  . Cholecalciferol (VITAMIN D3) 25 MCG (1000 UT) CAPS Take 1 capsule by mouth daily.    Marland Kitchen ELDERBERRY PO Take 1 tablet by mouth daily.    . folic acid (FOLVITE) 1 MG tablet Take 1 mg by mouth daily.    Marland Kitchen HUMIRA PEN 40 MG/0.4ML PNKT every 14 (fourteen) days.     Marland Kitchen levothyroxine (SYNTHROID, LEVOTHROID) 112 MCG tablet Take 112 mcg by mouth daily before breakfast.    . Multiple Vitamin (MULTIVITAMIN WITH MINERALS) TABS tablet Take 1 tablet by mouth daily.    . Multiple Vitamins-Minerals (GNP HAIR/SKIN/NAILS PO) Take by mouth.    . Riboflavin 100 MG CAPS Take by mouth.    . rosuvastatin (CRESTOR) 20 MG tablet TAKE 1 TABLET BY MOUTH EVERY DAY (Patient taking differently: Take 20 mg by mouth daily. Take 1/2 tablet every day by mouth.) 90 tablet 1  . vitamin C (ASCORBIC ACID) 500 MG tablet Take 500 mg by mouth daily.    Marland Kitchen BREO ELLIPTA 100-25 MCG/INH AEPB TAKE 1 PUFF BY MOUTH EVERY DAY (Patient not taking: Reported on 10/07/2019) 180 each 1  . fluticasone (FLONASE) 50 MCG/ACT nasal spray Place 2 sprays into both nostrils daily as needed for allergies or rhinitis. (Patient not taking: Reported on 10/07/2019) 16 g 2   No current facility-administered medications on file prior to visit.     Observations/Objective: Patient appears  well with no apparent distress.  Speaks in full sentences.  Assessment and Plan: Asthma-currently well controlled.  Improved symptom control on Symbicort  Chronic rhinitis-continue on saline rinses  Chronic cough-continue on trigger prevention May use Delsym if needed  Plan  Patient Instructions  Delsym 2 tsp Twice daily  As needed  Cough .  Symbicort 2 puffs Twice daily  , rinse after use.  Saline nasal rinses As needed   Follow-up with  Dr. Chase Caller in 3 months and and as needed Please contact office for sooner follow up if symptoms do not improve or worsen or seek emergency care       Follow Up Instructions: Follow-up in 3 months and as needed   I discussed the assessment and treatment plan with the patient. The patient was provided an opportunity to ask questions and all were answered. The patient agreed with the plan and demonstrated an understanding of the instructions.   The patient was advised to call back or seek an in-person evaluation if the symptoms worsen or if the condition fails to improve as anticipated.  I provided 22  minutes of non-face-to-face time during this encounter.   Rexene Edison, NP

## 2019-10-27 DIAGNOSIS — N950 Postmenopausal bleeding: Secondary | ICD-10-CM | POA: Diagnosis not present

## 2019-10-28 ENCOUNTER — Other Ambulatory Visit: Payer: Self-pay | Admitting: *Deleted

## 2019-10-28 MED ORDER — ALBUTEROL SULFATE HFA 108 (90 BASE) MCG/ACT IN AERS: 2.0000 | INHALATION_SPRAY | Freq: Four times a day (QID) | RESPIRATORY_TRACT | 3 refills | Status: DC | PRN

## 2019-10-29 DIAGNOSIS — J3489 Other specified disorders of nose and nasal sinuses: Secondary | ICD-10-CM | POA: Diagnosis not present

## 2019-10-29 DIAGNOSIS — H9201 Otalgia, right ear: Secondary | ICD-10-CM | POA: Diagnosis not present

## 2019-11-02 DIAGNOSIS — M810 Age-related osteoporosis without current pathological fracture: Secondary | ICD-10-CM | POA: Diagnosis not present

## 2019-11-02 DIAGNOSIS — E559 Vitamin D deficiency, unspecified: Secondary | ICD-10-CM | POA: Diagnosis not present

## 2019-11-02 DIAGNOSIS — E039 Hypothyroidism, unspecified: Secondary | ICD-10-CM | POA: Diagnosis not present

## 2019-11-09 ENCOUNTER — Telehealth: Payer: Self-pay | Admitting: Family Medicine

## 2019-11-09 ENCOUNTER — Other Ambulatory Visit: Payer: Self-pay | Admitting: *Deleted

## 2019-11-09 MED ORDER — EPINEPHRINE 0.3 MG/0.3ML IJ SOAJ: 0.3000 mg | INTRAMUSCULAR | 1 refills | Status: DC | PRN

## 2019-11-09 NOTE — Telephone Encounter (Signed)
Vaccine updated in patient chart Epi Pen

## 2019-11-09 NOTE — Telephone Encounter (Signed)
Pt called stating she received the 2nd dose of the [place] Vaccine today.   Pt states she needs to be prescribed a new EpiPen. States it has expired.   Pharmacy - [place]  Please advise.

## 2019-11-16 ENCOUNTER — Other Ambulatory Visit: Payer: Self-pay | Admitting: Family Medicine

## 2019-11-16 ENCOUNTER — Telehealth: Payer: BC Managed Care – PPO | Admitting: Internal Medicine

## 2019-11-16 DIAGNOSIS — H9201 Otalgia, right ear: Secondary | ICD-10-CM | POA: Diagnosis not present

## 2019-11-16 DIAGNOSIS — R519 Headache, unspecified: Secondary | ICD-10-CM | POA: Diagnosis not present

## 2019-11-16 DIAGNOSIS — R448 Other symptoms and signs involving general sensations and perceptions: Secondary | ICD-10-CM | POA: Diagnosis not present

## 2019-11-23 DIAGNOSIS — L600 Ingrowing nail: Secondary | ICD-10-CM | POA: Diagnosis not present

## 2019-11-23 DIAGNOSIS — M25774 Osteophyte, right foot: Secondary | ICD-10-CM | POA: Diagnosis not present

## 2019-12-07 ENCOUNTER — Ambulatory Visit: Payer: BC Managed Care – PPO | Admitting: Cardiology

## 2019-12-17 ENCOUNTER — Other Ambulatory Visit: Payer: BC Managed Care – PPO

## 2019-12-17 ENCOUNTER — Ambulatory Visit (INDEPENDENT_AMBULATORY_CARE_PROVIDER_SITE_OTHER): Payer: BC Managed Care – PPO | Admitting: Internal Medicine

## 2019-12-17 ENCOUNTER — Encounter: Payer: Self-pay | Admitting: Internal Medicine

## 2019-12-17 ENCOUNTER — Other Ambulatory Visit: Payer: Self-pay

## 2019-12-17 VITALS — BP 138/86 | HR 100 | Temp 98.9°F | Resp 18 | Ht 60.0 in | Wt 155.6 lb

## 2019-12-17 DIAGNOSIS — J387 Other diseases of larynx: Secondary | ICD-10-CM

## 2019-12-17 DIAGNOSIS — D849 Immunodeficiency, unspecified: Secondary | ICD-10-CM | POA: Diagnosis not present

## 2019-12-17 DIAGNOSIS — R05 Cough: Secondary | ICD-10-CM

## 2019-12-17 DIAGNOSIS — R053 Chronic cough: Secondary | ICD-10-CM

## 2019-12-17 NOTE — Progress Notes (Signed)
PCP Jani Gravel, MD  Neurologist-Dr. Krista Blue Infectious diseases Dr. Michel Bickers Orthopedic for right shoulder-Dr. Hildred Priest 06/18/2018  Chief Complaint  Patient presents with  . Pulmonary Consult    asthma, [name], NP referred her, she went to the ER and she had bronchitis with fever, she has constant fever and she has seen Infectious DIsease for this, she has a dry hacking cough    59 year old female with a chronic complex set of medical problems that includes hyperlipidemia, Graves' disease, status post thyroidectomy, ulcerative colitis, kidney stone and history of acute pancreatitis.  She also has long history of migraine headaches.  She presents with her husband for respiratory issues in the context of fever of unknown origin and bloody stools  She tells me that she was diagnosed with ulcerative colitis in [place] in 2007 when the used to live [place].  In 2013 she migrated to [place], [place] and had a bad flareup of ulcerative colitis for which she was admitted.  Since then she has been under the care of Dr. Collene Mares gastroenterologist and she has been on Lialda.  Overall she is doing really wel0 .then some few years ago she was started on Brio for diagnosis of asthma.  Details of this is unclear.  Overall doing well since then till September 2019 when she felt the Lialda stopped working on a subjective basis.  This is because since then every time she takes the Lialda she has abdominal pain.  She also has fever at night on a daily basis.  She also has bloody stools at least 7 times a day.  She has abdominal pain when she takes the [place].  Per her report Dr. Collene Mares has done a colonoscopy with biopsy that showed inflammation.  Therefore biologic is being considered.  However she is also increasing fatigue and shortness of breath associated with it.  Is mostly present on exertion relieved by rest.  Is also wheezing.  Overall she has had a significant deterioration.   Then towards the end of October 2019 she felt she had an asthma flareup and reported to the urgent care center where she was treated acutely.  But she represented within a week or 10 days to the ER and was given a diagnosis of acute bronchitis.  CT angiogram ruled out pulmonary embolism.  All these records have been reviewed by me and the images and results visualized.  She subsequently has seen Dr. Michel Bickers infectious diseases this week.  I reviewed his findings.  He is worried that the Lialda is causing eosinophilia.  He does not suspect infectious disease as a reason for the fever quantified on gold and HIV are negative.  Further blood results are pending.  Patient says she has had an extensive work-up.  She is also seen [facility] rheumatology department Dr. Joellyn Rued who apparently has done an extensive autoimmune panel and this is all negative.  Unclear is if vasculitis work-up has been done that includes ANCA and GBM.  Patient is really frustrated by the whole constellation of symptoms.  At this point in time a biologic agent is being considered for ulcerative colitis but apparently the fever is being considered is unusual in the setting of ulcerative colitis and therefore she is going through the work-up  Exam nitric oxide is 21 ppb and this is on Breo today and is normal.  Recent labs June 12, 2018 done at [facility] rheumatology: Sjogren antibody centromere antibody,  Jo 1 antibody, chromatin antibody, SSA, SSB, anti-scleroderma antibody, Smith antibody, double-stranded DNA antibody and RNP antibody and ESR all normal.  TSH is normal.  Hemoglobin is 12 g%.  Your sniffles continue to be high at 13.6% of her total white count of 7400.  Creatinine is normal.  Urine analysis appears normal.  Results for NIQUITA, DIGIOIA (MRN [id number]) as of 06/18/2018 09:18  Ref. Range 11/27/2013 12:40 03/10/2014 18:52 03/16/2014 16:42 05/23/2018 05:31 06/16/2018 15:10    Eosinophils Absolute Latest Ref Range: 15 - 500 cells/uL 0.2 0.4 0.2 0.6 (H) 385   Results for AMARILYS, LYLES (MRN [id number]) as of 06/18/2018 09:18  Ref. Range 10/05/2013 14:36 03/16/2014 16:42  Anti Nuclear Antibody(ANA) Latest Ref Range: NEGATIVE  POS (A)   ANA Pattern 1 Unknown HOMOGENOUS (A)   ANA Titer 1 Latest Ref Range: <1:40   1:1280 (H)   ds DNA Ab Latest Units: IU/mL  1  C3 Complement Latest Ref Range: 90 - 180 mg/dL  116  C4 Complement Latest Ref Range: 10 - 40 mg/dL  18   Most recent CT scan of the chest angiogram May 23, 2018: Personally visualized.  Lung fields look fairly benign.  OV 07/29/2018  Subjective:  Patient ID: [name], female , DOB: [birth year] , age 59 y.o. , MRN: [id number] , ADDRESS: [address] [place] [id number]   07/29/2018 -   Chief Complaint  Patient presents with  . Follow-up    Pt states she is doing better since last visit. States she still has an occ dry cough but nothing like last visit. Pt states she does have some SOB/chest tightness today due to the weather.     HPI Yennifer Segovia 59 y.o. -presents with her husband for chronic cough and fever of unknown origin in the setting of ulcerative colitis and Lialda treatment  After her last visit we reviewed autoimmune panel from primary rheumatologist and this was negative.  We performed vasculitis panel and this was negative.  In the interim she is seen infectious disease specialist Dr. Megan Salon and apparently no infection present for her fever of unknown origin.  Discussions were held with Dr. Collene Mares.  She also discussed with a gastroenterologist at [facility] and the thought process was that maybe the fevers are indeed coming from ulcerative colitis although this is very rare.  This is because autoimmune, vasculitis and infectious work-up was negative.  On July 01, 2018 patient's Lialda was stopped and she has been prescribed budesonide oral treatment.  Subsequently  after this within a few weeks her cough is significantly improved.  She not having any more night sweats other than one at night.  This is 80% better.  And she is also feeling a lot better.  Apparently starting Entyvio biologic for ulcerative colitis is in discussion but she is somewhat got trepidation about this given the fact it is an infusion.  She will meet with Dr. Collene Mares about this.  Overall at this point she is feeling well no respiratory complaints other than the mild cough.  She is asking for referral to primary care physician.  Her current primary care physician is apparently out of the office for an extended period of time not otherwise specified.   OV 04/09/2019  Subjective:  Patient ID: [name], female , DOB: 07/21/1961 , age 87 y.o. , MRN: [id number] , ADDRESS: [address] [place] [id number]   04/09/2019 -  chronic cough and fever of unknown origin in the setting  of ulcerative colitis and Lialda treatment    HPI Danyeal Akens 59 y.o. - cough was resolved. Now back 1.5 weeks ago. Dry cough. Hacky cough. Sometimes she does bring sputum. Annoying. Has on and off migraine headaches. No fever, No hemoptysis . No anosmia. No ageusia. No covid exposures but daughter had strep throat 2 weeks ago (confirmed strep with confirmed negative covid). No sore throat. Throat does feel itchy.  Does NOT want to do antibiotics. Doing low risk covid activities only  Re UC - doing Humira by Dr Therisa Doyne since October 01, 2018 - q 2 weeks. .  Fevers went away in dec 2019. UC doing ok  OV 04/16/2019  Subjective:  Patient ID: [name], female , DOB: 1961-04-07 , age 69 y.o. , MRN: [id number] , ADDRESS: [address] [place] [id number]   04/16/2019 -   Chief Complaint  Patient presents with  . Follow-up    Pt c/o prod cough with yellow mucus x 2 weeks. Pt also c/o left sided discomfort on lateral chest with inspiration. Pt states due to the discomfort she feels she cant take a full  breath in. Pt denies f/c/s and swelling.    Acute visit  HPI Argentina Kosch 59 y.o. -is here for acute visit.  Originally seen in  Nov2019 for fever of unknown origin.  At that time ulcerative colitis was suspected along with Lialda because she had some eosinophilia.  She has been on Breo.  She tells me that her fever and cough broke and it was unrelated to stopping Lialda.  She then switched gastroenterologist and has been on care with Dr. Therisa Doyne.  She is on Humira but she tells me the fever and cough stopped even before she went on Humira.  She had been doing well.  Approximately 3 weeks ago or so daughter developed strep throat [apparently COVID negative].  Then approximately 2 weeks ago she developed a cough.  We did do a telephone visit.  At that time she opted for expectant follow-up.  She is here acutely because the cough is persistent.  She also has some yellow sputum.  A few days ago she did feel like a muscle pull in the left infra-axillary area with the cough.  This is resolved.  Early this morning she had very mild symptoms of the same.  Therefore she is here but there is no wheezing or shortness of breath.  There is no pleuritic chest pain.  There is no hemoptysis no pedal edema.  She continues with the Brio.  We did do a COVID antigen testing/PCR testing and this was negative on 10 April 2019.  In May 2020 her COVID serology for IgG was negative.  She is interested in a chest x-ray and empiric treatment with antibiotics.  We are unable to do an exhaled nitric oxide test because of the national [place] emergency and local site restrictions.   COVID negative 04/10/2019 - negtaive Results for ABEL, RA (MRN [id number]) as of 04/16/2019 15:48  Ref. Range 12/17/2018 09:18  SARS CoV2 AB IGG Unknown NEGATIVE    Results for ARACELY, RICKETT (MRN [id number]) as of 04/16/2019 15:48  Ref. Range 06/18/2018 10:17 06/26/2018 01:15 06/26/2018 14:29  Eosinophils Absolute Latest Ref Range: 15 - 500  cells/uL 0.9 (H) 0.4 535 (H)  Results for CHANEY, INGRAM (MRN [id number]) as of 04/16/2019 15:48  Ref. Range 06/18/2018 10:17  IgE (Immunoglobulin E), Serum Latest Ref Range: <OR=114 kU/L 74   Results for MERISSA, RENWICK (MRN [id number])  as of 04/16/2019 15:48  Ref. Range 08/15/2012 19:14 10/05/2013 14:36 03/16/2014 16:42 06/26/2018 14:29 07/02/2018 10:42  Anti Nuclear Antibody (ANA) Latest Ref Range: Negative   POS (A)   Negative  ANA Pattern 1 Unknown  HOMOGENOUS (A)     ANA Titer 1 Latest Ref Range: <1:40    1:1280 (H)     ds DNA Ab Latest Units: IU/mL   1    GBM Ab Latest Units: AI    <1.0 <1.0  Mitochondrial M2 Ab, IgG Latest Ref Range: <0.91  1.14 (H)      Myeloperoxidase Abs Latest Units: AI     <1.0  Serine Protease 3 Latest Units: AI     <1.0  S cerevisiae IgG ab Latest Ref Range: <=20.0 Units 12.7      S cerevisiae IgA ab Latest Ref Range: <=20.0 Units 18.3       ROS  October 07, 2019:.  Visit with nurse practitioner Tammy Parrott  59 year old female never smoker followed for asthma, eosinophilia (possibly secondary to Lialda )  Medical history significant for Graves' disease, ulcerative colitis (now on Humira) and pancreatitis Today's video visit is for 4 week follow up for asthma and chronic cough .  Last visit patient was having increased cough.  She was changed from [facility] to Symbicort.  She says she is feeling better.  Feels that she can take in a clear breath and also cough has decreased.  She is practicing vocal cord relaxation techniques.  Feels that this is also helping.  She is using saline nasal rinses and this also seems to decrease her nasal congestion and postnasal drip.  She says overall she is feeling better.    OV 12/17/2019  Subjective:  Patient ID: [name], female , DOB: Mar 30, 1961 , age 34 y.o. , MRN: [id number] , ADDRESS: [address] [place] [id number]   12/17/2019 -   Chief Complaint  Patient presents with  . Follow-up    Cough      HPI Tayelor Osborne 59 y.o. -presents for evaluation of chronic cough and follow-up.  After last saw her nurse practitioner saw her and it is documented above.  This was in March 2021.  Her symptoms had improved with cough neuropathy advised.  Patient was reluctant to undergo gabapentin.  She now returns for follow-up.  She tells me the cough is deteriorated with the change in weather from spring to summer and the humidity.  She feels she has asthma.  This is despite taking Symbicort.  She demonstrated a cough for me and it has a laryngeal quality.  She admits that throat is very dry and she feels it to be itchy.  There is also a tickle in the throat.  Cough does not bother her at night when she sleeping.  Otherwise no new problems.  Overall she feels her health is improved and her new immunosuppression is working well.     ROS - per HPI     has a past medical history of Asthma, Chronic kidney disease, pancreatitis (06/16/2018), Kidney stone, Kidney stone, Migraine, Orthostatic hypotension, Osteopenia (03/31/2012), and Ulcerative colitis ([place]) (03/31/2012).   reports that she has never smoked. She has never used smokeless tobacco.  Past Surgical History:  Procedure Laterality Date  . CESAREAN SECTION     x3  . CHOLECYSTECTOMY    . COLONOSCOPY    . LITHOTRIPSY    . THYROIDECTOMY     1997    Allergies  Allergen Reactions  .  Almond Oil Hives and Shortness Of Breath  . Lialda [Mesalamine] Other (See Comments)    Vomiting and rectal bleeding  . Other Anaphylaxis    Almonds, tree nuts  . Aspirin Hives and Nausea Only    Stomach cramps  . Hydromorphone Hcl Other (See Comments)    Caused Enzyme levels to go up  . Penicillins Hives and Swelling    Has patient had a PCN reaction causing immediate rash, facial/tongue/throat swelling, SOB or lightheadedness with hypotension: Yes Has patient had a PCN reaction causing severe rash involving mucus membranes or skin necrosis: No Has  patient had a PCN reaction that required hospitalization: No Has patient had a PCN reaction occurring within the last 10 years: No  Stomach cramps If all of the above answers are "NO", then may proceed with Cephalosporin use.   . Acetaminophen Other (See Comments)    Has autoimmune hepatitis, wants to avoid APAP  . Apriso [Mesalamine Er] Nausea And Vomiting  . Corticosteroids Other (See Comments)    Had acute pancreatitis x 2 after steroid use.  . Imitrex [Sumatriptan] Swelling    Injection caused swelling  . Stadol [Butorphanol] Other (See Comments)    hallucinates  . Topamax [Topiramate] Other (See Comments)    Kidney stones  . Vancomycin Hives  . Azathioprine Rash  . Pepcid [Famotidine] Nausea And Vomiting    Makes acid worse  . Prednisone Nausea And Vomiting    All steroids causes nausea and pancreatitis     Immunization History  Administered Date(s) Administered  . Influenza Inj Mdck Quad Pf 03/23/2018  . Influenza Inj Mdck Quad With Preservative 05/01/2019  . Influenza Split 04/10/2012, 04/24/2013  . Influenza, Seasonal, Injecte, Preservative Fre 04/15/2014  . Moderna SARS-COVID-2 Vaccination 10/09/2019, 11/09/2019  . PPD Test 10/05/2013  . Pneumococcal Polysaccharide-23 04/22/2008, 04/23/2013  . Tdap 07/23/2004, 07/22/2012  . Varicella 07/22/2012    Family History  Problem Relation Age of Onset  . Cervical cancer Paternal Aunt   . Heart disease Paternal Grandmother   . Heart disease Mother   . Dementia Mother   . Thyroid disease Mother   . Osteoarthritis Mother   . Asthma Mother   . Heart attack Father   . Breast cancer Cousin   . Heart attack Brother   . Heart disease Brother      Current Outpatient Medications:  .  albuterol (PROAIR HFA) 108 (90 Base) MCG/ACT inhaler, Inhale 2 puffs into the lungs every 6 (six) hours as needed for wheezing or shortness of breath., Disp: 18 g, Rfl: 3 .  BREO ELLIPTA 100-25 MCG/INH AEPB, TAKE 1 PUFF BY MOUTH EVERY DAY  (Patient not taking: Reported on 10/07/2019), Disp: 180 each, Rfl: 1 .  budesonide-formoterol (SYMBICORT) 160-4.5 MCG/ACT inhaler, Inhale 2 puffs into the lungs 2 (two) times daily., Disp: 1 Inhaler, Rfl: 5 .  Cholecalciferol (VITAMIN D3) 25 MCG (1000 UT) CAPS, Take 1 capsule by mouth daily., Disp: , Rfl:  .  ELDERBERRY PO, Take 1 tablet by mouth daily., Disp: , Rfl:  .  EPINEPHrine 0.3 mg/0.3 mL IJ SOAJ injection, Inject 0.3 mLs (0.3 mg total) into the muscle as needed for anaphylaxis., Disp: 1 each, Rfl: 1 .  fluticasone (FLONASE) 50 MCG/ACT nasal spray, PLACE 2 SPRAYS INTO BOTH NOSTRILS DAILY AS NEEDED FOR ALLERGIES OR RHINITIS., Disp: 48 mL, Rfl: 1 .  folic acid (FOLVITE) 1 MG tablet, Take 1 mg by mouth daily., Disp: , Rfl:  .  HUMIRA PEN 40 MG/0.4ML PNKT, every  14 (fourteen) days. , Disp: , Rfl:  .  levothyroxine (SYNTHROID, LEVOTHROID) 112 MCG tablet, Take 112 mcg by mouth daily before breakfast., Disp: , Rfl:  .  Multiple Vitamin (MULTIVITAMIN WITH MINERALS) TABS tablet, Take 1 tablet by mouth daily., Disp: , Rfl:  .  Multiple Vitamins-Minerals (GNP HAIR/SKIN/NAILS PO), Take by mouth., Disp: , Rfl:  .  Riboflavin 100 MG CAPS, Take by mouth., Disp: , Rfl:  .  rosuvastatin (CRESTOR) 20 MG tablet, TAKE 1 TABLET BY MOUTH EVERY DAY (Patient taking differently: Take 20 mg by mouth daily. Take 1/2 tablet every day by mouth.), Disp: 90 tablet, Rfl: 1 .  vitamin C (ASCORBIC ACID) 500 MG tablet, Take 500 mg by mouth daily., Disp: , Rfl:       Objective:   Vitals:   12/17/19 0950  BP: 138/86  Pulse: 100  Resp: 18  Temp: 98.9 F (37.2 C)  TempSrc: Oral  SpO2: 100%  Weight: 155 lb 9.6 oz (70.6 kg)  Height: 5' (1.524 m)    Estimated body mass index is 30.39 kg/m as calculated from the following:   Height as of this encounter: 5' (1.524 m).   Weight as of this encounter: 155 lb 9.6 oz (70.6 kg).  _0 @  Filed Weights   12/17/19 0950  Weight: 155 lb 9.6 oz (70.6 kg)      Physical Exam Very mild postnasal drip clear to auscultation bilaterally no neck nodes.  No rashes no stigmata of connective tissue disease abdomen soft.  Alert and oriented x3.  Pleasant.  Overall nonfocal exam.         Assessment:       ICD-10-CM   1. Chronic cough  R05   2. Irritable larynx  J38.7   3. Immunosuppressed status ([place])  D84.9        Plan:     Patient Instructions     ICD-10-CM   1. Chronic cough  R05   2. Irritable larynx  J38.7    The conditioning of his chronic refractory cough  There are a lot of features to suggest this is cough neuropathy otherwise call irritable larynx syndrome  CT scan of the chest November 2020 lung fields look clear and is reassuring that you do not have opportunistic infection because of Humira  Agree do no need to rule out allergies  Plan - Do blood allergy profile, IgE, feno testing (with symbicort), full PFT -CCotninue symbicort as before -Every time you have the urge to cough drink water or suck on sugarless lozenge -Take 2-3 days of complete voice rest while you are not whispering or talking  - keep doing this monthly -Continue Flonase and nasal lavage   Follow-up -In 3-4 weeks either with nurse practioner to go over results -If no improvement then will consider a course of gabapentin and/or referral to voice rehabilitation  - to be discussed at followup; understand your reluctance for more medicines -Depending on the course of bronchoscopy with lavage to rule out opportunistic infection is something to be kept in mind     SIGNATURE    Dr. Brand Males, M.D., F.C.C.P,  Pulmonary and Critical Care Medicine Staff Physician, [place] Director - Interstitial Lung Disease  Program  Pulmonary [place] at [place], [place], [address]  Pager: [phone], If no answer or between  15:00h - 7:00h: call [phone] Telephone: [phone]  10:07  AM 12/17/2019

## 2019-12-17 NOTE — Patient Instructions (Addendum)
ICD-10-CM   1. Chronic cough  R05   2. Irritable larynx  J38.7    The conditioning of his chronic refractory cough  There are a lot of features to suggest this is cough neuropathy otherwise call irritable larynx syndrome  CT scan of the chest November 2020 lung fields look clear and is reassuring that you do not have opportunistic infection because of Humira  Agree do no need to rule out allergies  Plan - Do blood allergy profile, IgE, feno testing (with symbicort), full PFT -CCotninue symbicort as before -Every time you have the urge to cough drink water or suck on sugarless lozenge -Take 2-3 days of complete voice rest while you are not whispering or talking  - keep doing this monthly -Continue Flonase and nasal lavage   Follow-up -In 3-4 weeks either with nurse practioner to go over results -If no improvement then will consider a course of gabapentin and/or referral to voice rehabilitation  - to be discussed at followup; understand your reluctance for more medicines -Depending on the course of bronchoscopy with lavage to rule out opportunistic infection is something to be kept in mind

## 2019-12-18 LAB — RESPIRATORY ALLERGY PROFILE REGION II ~~LOC~~
Allergen, A. alternata, m6: <0.1 kU/L
Allergen, Cedar tree, t12: <0.1 kU/L
Allergen, Comm Silver Birch, t9: 1.71 kU/L — ABNORMAL HIGH
Allergen, Cottonwood, t14: <0.1 kU/L
Allergen, D pternoyssinus,d7: <0.1 kU/L
Allergen, Mouse Urine Protein, e78: <0.1 kU/L
Allergen, Mulberry, t76: <0.1 kU/L
Allergen, Oak,t7: 0.88 kU/L — ABNORMAL HIGH
Allergen, P. notatum, m1: <0.1 kU/L
Aspergillus fumigatus, m3: <0.1 kU/L
Bermuda Grass: <0.1 kU/L
Box Elder IgE: <0.1 kU/L
CLADOSPORIUM HERBARUM (M2) IGE: <0.1 kU/L
COMMON RAGWEED (SHORT) (W1) IGE: 0.87 kU/L — ABNORMAL HIGH
Cat Dander: 2.89 kU/L — ABNORMAL HIGH
Class: 0
Class: 0
Class: 0
Class: 0
Class: 0
Class: 0
Class: 0
Class: 0
Class: 0
Class: 0
Class: 0
Class: 0
Class: 0
Class: 0
Class: 0
Class: 0
Class: 0
Class: 0
Class: 2
Class: 2
Class: 2
Class: 2
Class: 4
Cockroach: <0.1 kU/L
D. farinae: <0.1 kU/L
Dog Dander: 22 kU/L — ABNORMAL HIGH
Elm IgE: <0.1 kU/L
IgE (Immunoglobulin E), Serum: 158 kU/L — ABNORMAL HIGH (ref <=114)
Johnson Grass: <0.1 kU/L
Pecan/Hickory Tree IgE: <0.1 kU/L
Rough Pigweed  IgE: <0.1 kU/L
Sheep Sorrel IgE: <0.1 kU/L
Timothy Grass: 0.16 kU/L — ABNORMAL HIGH

## 2019-12-18 LAB — INTERPRETATION:

## 2019-12-18 IMAGING — DX DG CHEST 2V
2 series · 2 of 2 positions shown · non-contrast
Comparison: 11/05/2018

CLINICAL DATA: Cough

EXAM:
CHEST - 2 VIEW

[chest pa]
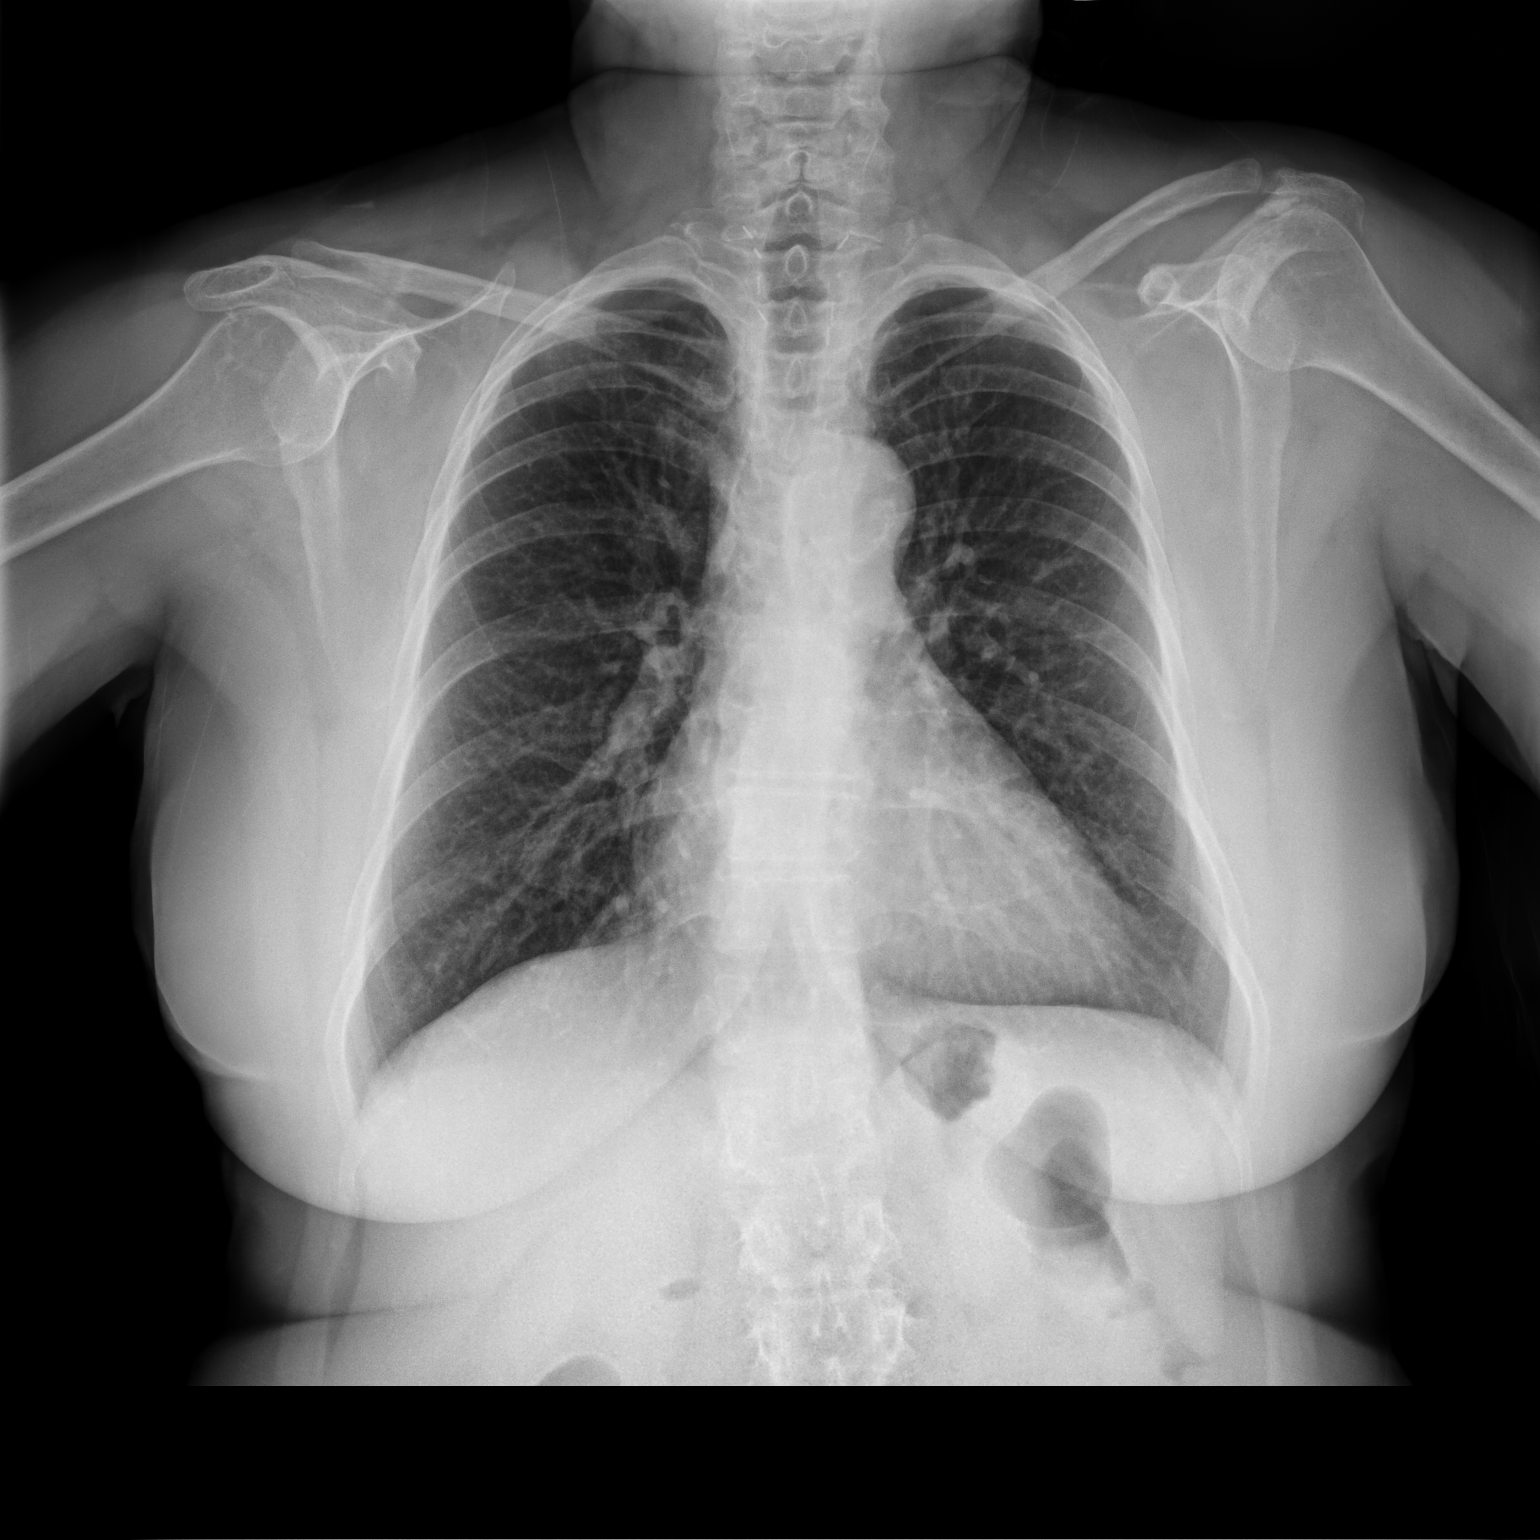

[chest lat]
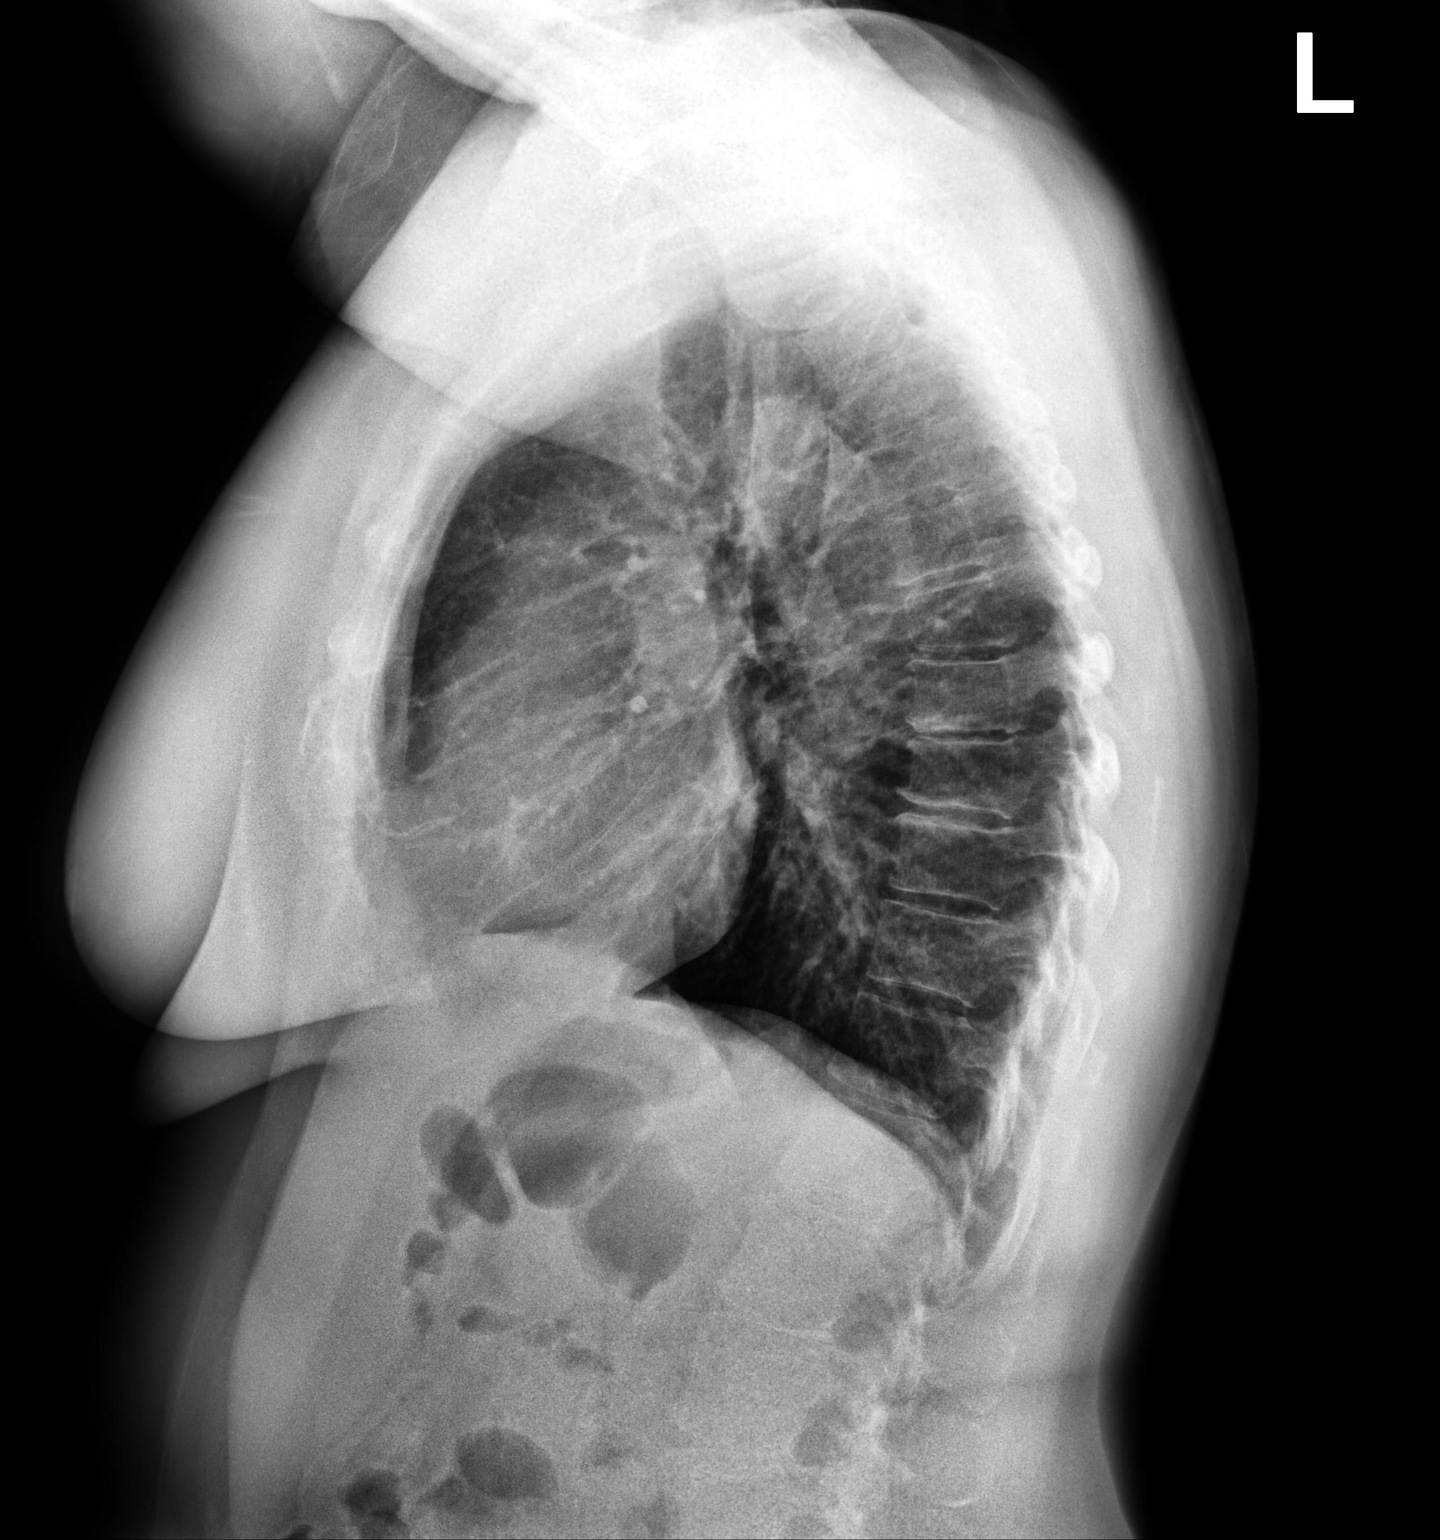

[2 of 2 positions shown; findings below may reference images not displayed]

FINDINGS: Mild cardiomegaly. Both lungs are clear. Disc degenerative disease
of the thoracic spine.
IMPRESSION: Mild cardiomegaly without acute abnormality of the lungs.

## 2019-12-23 ENCOUNTER — Ambulatory Visit: Payer: BC Managed Care – PPO | Admitting: Cardiology

## 2020-01-04 DIAGNOSIS — K5190 Ulcerative colitis, unspecified, without complications: Secondary | ICD-10-CM | POA: Diagnosis not present

## 2020-01-11 DIAGNOSIS — K5190 Ulcerative colitis, unspecified, without complications: Secondary | ICD-10-CM | POA: Diagnosis not present

## 2020-01-12 ENCOUNTER — Telehealth: Payer: Self-pay | Admitting: Internal Medicine

## 2020-01-12 NOTE — Telephone Encounter (Signed)
Patient requesting results from recent allergy panels. Please advise. Patient knows you are out of town and will wait to hear from you.

## 2020-01-16 NOTE — Telephone Encounter (Signed)
She has fe allried to cat, dog, grass nad IgE  Is up  Plan  - keep up with feno and pft testing in July and see Wyn Quaker for followup    SIGNATURE    Dr. Brand Males, M.D., F.C.C.P,  Pulmonary and Critical Care Medicine Staff Physician, [place] Director - Interstitial Lung Disease  Program  Pulmonary [place] at [place], [place], [address]  Pager: [phone], If no answer or between  15:00h - 7:00h: call [phone] Telephone: [phone]  4:32 PM 01/16/2020    Results for Danielle Bond, Danielle Bond (MRN [id number]) as of 01/16/2020 16:32  Ref. Range 06/26/2018 01:15 06/26/2018 14:29 03/03/2019 16:44 04/16/2019 16:27 05/29/2019 10:13  Eosinophils Absolute Latest Ref Range: 0 - 0 K/uL 0.4 535 (H)  0.2 0.1   Results for Danielle Bond, Danielle Bond (MRN [id number]) as of 01/16/2020 16:30  Ref. Range 12/17/2019 10:45  Sheep Sorrel IgE Latest Units: kU/L <0.10  Pecan/Hickory Tree IgE Latest Units: kU/L <0.10  IgE (Immunoglobulin E), Serum Latest Ref Range: <OR=114 kU/L 158 (H)  Allergen, D pternoyssinus,d7 Latest Units: kU/L <0.10  Cat Dander Latest Units: kU/L 2.89 (H)  Dog Dander Latest Units: kU/L 22.00 (H)  [place] Grass Latest Units: kU/L <0.10  Johnson Grass Latest Units: kU/L <0.10  Timothy Grass Latest Units: kU/L 0.16 (H)  Cockroach Latest Units: kU/L <0.10  Aspergillus fumigatus, m3 Latest Units: kU/L <0.10  Allergen, Comm Silver Wendee Copp, t9 Latest Units: kU/L 1.71 (H)  Allergen, [place]wood, t14 Latest Units: kU/L <0.10  Elm IgE Latest Units: kU/L <0.10  Allergen, Mulberry, t76 Latest Units: kU/L <0.10  Allergen, Oak,t7 Latest Units: kU/L 0.88 (H)  COMMON RAGWEED (SHORT) (W1) IGE Latest Units: kU/L 0.87 (H)  Allergen, Mouse Urine Protein, e78 Latest Units: kU/L <0.10  D. farinae Latest Units: kU/L <0.10  Allergen, Cedar tree, t12 Latest Units: kU/L <0.10  Box Elder IgE Latest Units: kU/L <0.10  Rough Pigweed  IgE Latest  Units: kU/L <0.10

## 2020-01-18 ENCOUNTER — Telehealth: Payer: Self-pay | Admitting: Internal Medicine

## 2020-01-18 DIAGNOSIS — E782 Mixed hyperlipidemia: Secondary | ICD-10-CM | POA: Diagnosis not present

## 2020-01-18 NOTE — Telephone Encounter (Signed)
ATC pt, no answer. Left message for pt to call back.  

## 2020-01-18 NOTE — Telephone Encounter (Signed)
Patient contacted and results of recent allergy test reviewed. Patient will review in more detail at next visit in July.

## 2020-01-19 LAB — LIPID PANEL
Chol/HDL Ratio: 3.3 ratio (ref 0.0–4.4)
Cholesterol, Total: 245 mg/dL — ABNORMAL HIGH (ref 100–199)
HDL: 75 mg/dL (ref >39)
LDL Chol Calc (NIH): 148 mg/dL — ABNORMAL HIGH (ref 0–99)
Triglycerides: 126 mg/dL (ref 0–149)
VLDL Cholesterol Cal: 22 mg/dL (ref 5–40)

## 2020-01-20 ENCOUNTER — Other Ambulatory Visit: Payer: Self-pay

## 2020-01-20 ENCOUNTER — Ambulatory Visit: Payer: BC Managed Care – PPO | Admitting: Cardiology

## 2020-01-20 ENCOUNTER — Encounter: Payer: Self-pay | Admitting: Cardiology

## 2020-01-20 VITALS — BP 157/90 | HR 87 | Resp 17 | Ht 60.0 in | Wt 156.6 lb

## 2020-01-20 DIAGNOSIS — I2510 Atherosclerotic heart disease of native coronary artery without angina pectoris: Secondary | ICD-10-CM

## 2020-01-20 DIAGNOSIS — I700 Atherosclerosis of aorta: Secondary | ICD-10-CM

## 2020-01-20 DIAGNOSIS — E782 Mixed hyperlipidemia: Secondary | ICD-10-CM

## 2020-01-20 DIAGNOSIS — R030 Elevated blood-pressure reading, without diagnosis of hypertension: Secondary | ICD-10-CM | POA: Diagnosis not present

## 2020-01-20 MED ORDER — ROSUVASTATIN CALCIUM 20 MG PO TABS: 20.0000 mg | ORAL_TABLET | Freq: Every day | ORAL | 3 refills | Status: DC

## 2020-01-20 NOTE — Progress Notes (Signed)
Patient referred by [name], MD for abnormal echocardiogram.  Subjective:   Danielle Bond, female    DOB: [birth year], 59 y.o.   MRN: [id number]   Chief Complaint  Patient presents with  . Hyperlipidemia  . Dizziness  . Follow-up    71 month    59 y/o Caucasian female with hyperlipidemia, aortic and coronary atherrosclerosis, presyncope, Graves' disease, status post thyroidectomy, ulcerative colitis, h/o acute pancreatitis, migraine,  Patient has had a lot of stressors at home with the pandemic and daughter going back to college. Patient is very emotional about this.  She is noncompliant with her statin owing to her GI complaints from ulcerative colitis.  Recent lipid panel reviewed with the patient, however LDL has significantly increased.    Current Outpatient Medications on File Prior to Visit  Medication Sig Dispense Refill  . albuterol (PROAIR HFA) 108 (90 Base) MCG/ACT inhaler Inhale 2 puffs into the lungs every 6 (six) hours as needed for wheezing or shortness of breath. 18 g 3  . budesonide-formoterol (SYMBICORT) 160-4.5 MCG/ACT inhaler Inhale 2 puffs into the lungs 2 (two) times daily. 1 Inhaler 5  . Cholecalciferol (VITAMIN D3) 25 MCG (1000 UT) CAPS Take 1 capsule by mouth daily.    Marland Kitchen ELDERBERRY PO Take 1 tablet by mouth daily.    Marland Kitchen EPINEPHrine 0.3 mg/0.3 mL IJ SOAJ injection Inject 0.3 mLs (0.3 mg total) into the muscle as needed for anaphylaxis. 1 each 1  . fluticasone (FLONASE) 50 MCG/ACT nasal spray PLACE 2 SPRAYS INTO BOTH NOSTRILS DAILY AS NEEDED FOR ALLERGIES OR RHINITIS. 48 mL 1  . folic acid (FOLVITE) 1 MG tablet Take 1 mg by mouth daily.    Marland Kitchen HUMIRA PEN 40 MG/0.4ML PNKT every 14 (fourteen) days.     Marland Kitchen levothyroxine (SYNTHROID, LEVOTHROID) 112 MCG tablet Take 112 mcg by mouth daily before breakfast.    . Multiple Vitamin (MULTIVITAMIN WITH MINERALS) TABS tablet Take 1 tablet by mouth daily.    . Multiple Vitamins-Minerals (GNP HAIR/SKIN/NAILS PO) Take  by mouth.    . pantoprazole (PROTONIX) 40 MG tablet Take 40 mg by mouth daily.    . Riboflavin 100 MG CAPS Take by mouth.    . rosuvastatin (CRESTOR) 20 MG tablet TAKE 1 TABLET BY MOUTH EVERY DAY (Patient taking differently: Take 20 mg by mouth daily. Take 1/2 tablet every day by mouth.) 90 tablet 1  . vitamin C (ASCORBIC ACID) 500 MG tablet Take 500 mg by mouth daily.     No current facility-administered medications on file prior to visit.    Cardiovascular studies:  EKG 05/01/2019: Sinus rhythm 97 vpm. Nonspecific T-abnormality.   Echocardiogram 04/28/2019: Normal LV size and thickness. EF 55-60%. Impaired relaxation. Normal RV size and function. No significant valvular abnormality.   CTA chest 05/23/2018: No demonstrable pulmonary embolus. No thoracic aortic aneurysm or dissection. There is aortic atherosclerosis. Right subclavian artery arises distally to the other great vessels, passing posteriorly to the esophagus and impressing upon the esophagus as it passes to the right. No lung edema or consolidation. No evident thoracic adenopathy. Aortic Atherosclerosis (ICD10-I70.0).  CXR 04/17/2019: Mild cardiomegaly without acute abnormality of the lungs.  CTA coronary angiogram 12/2017: pLAD minimal atherosclerosis. Rest normal. Calcium score 6.   Recent labs: 01/18/2020: Chol 245, TG 126, HDL 75, LDL 148  Lipid panel 05/28/2019: Cholesterol 175, triglycerides 70, HDL 66, LDL 96.  Lipid panel 2017: Chol 184, TG 84, HDL 1813, LDL 84.  Lipid panel 2015: Chol  260, TG 73, HDL 59, LDL 193   Review of Systems  Cardiovascular: Negative for chest pain, dyspnea on exertion, leg swelling, palpitations and syncope.  Neurological: Positive for dizziness.       Vitals:   01/20/20 0827 01/20/20 0832  BP: (!) 144/90 (!) 157/90  Pulse: 85 87  Resp: 17   SpO2: 99% 100%     Body mass index is 30.58 kg/m. Filed Weights   01/20/20 0827  Weight: 156 lb 9.6 oz (71 kg)      Objective:   Physical Exam Vitals and nursing note reviewed.  Constitutional:      General: She is not in acute distress. Neck:     Vascular: No JVD.  Cardiovascular:     Rate and Rhythm: Normal rate and regular rhythm.     Pulses: Intact distal pulses.     Heart sounds: Normal heart sounds. No murmur heard.   Pulmonary:     Effort: Pulmonary effort is normal.     Breath sounds: Normal breath sounds. No wheezing or rales.           Assessment & Recommendations:   59 y/o Caucasian female with hyperlipidemia, aortic and coronary atherrosclerosis, presyncope, Graves' disease, status post thyroidectomy, ulcerative colitis, h/o acute pancreatitis, migraine,  Hyperlipidemia: LDL 146, emphasized importance of compliance with Crestor.  Patient verbalized understanding.  Repeat lipid panel in 3 months.  Aorta and coronary atherosclerosis: Minimal. Continue risk factor modification, especially lipid control. Not on Aspirin due to h/o GI bleeding with ulcerative colitis.   Elevated blood pressure without diagnosis of hypertension:  Stress is clearly playing a role.  Home blood pressure readings are normal per the patient.  No medication added today.  Emphasized salt restriction and regular exercise, as well as ways to cope up with stress.  Patient is to contact me sooner if blood pressure remains greater than 140/90 mmHg.    Follow-up in 3 months.    Nigel Mormon, MD [facility] Pager: [phone] Office: [phone] If no answer Cell [phone]

## 2020-01-21 ENCOUNTER — Other Ambulatory Visit: Payer: Self-pay

## 2020-01-21 MED ORDER — ROSUVASTATIN CALCIUM 10 MG PO TABS: 10.0000 mg | ORAL_TABLET | Freq: Every day | ORAL | 1 refills | Status: DC

## 2020-01-29 ENCOUNTER — Other Ambulatory Visit (HOSPITAL_COMMUNITY)
Admission: RE | Admit: 2020-01-29 | Discharge: 2020-01-29 | Disposition: A | Discharge status: Home / Self Care | Payer: BC Managed Care – PPO | Source: Ambulatory Visit | Attending: Internal Medicine | Admitting: Internal Medicine

## 2020-01-29 DIAGNOSIS — Z01812 Encounter for preprocedural laboratory examination: Secondary | ICD-10-CM | POA: Diagnosis not present

## 2020-01-29 DIAGNOSIS — Z20822 Contact with and (suspected) exposure to covid-19: Secondary | ICD-10-CM | POA: Insufficient documentation

## 2020-01-29 LAB — SARS CORONAVIRUS 2 (TAT 6-24 HRS): SARS Coronavirus 2: NEGATIVE

## 2020-02-02 ENCOUNTER — Other Ambulatory Visit: Payer: Self-pay

## 2020-02-02 ENCOUNTER — Encounter: Payer: Self-pay | Admitting: Pulmonary Disease

## 2020-02-02 ENCOUNTER — Telehealth (INDEPENDENT_AMBULATORY_CARE_PROVIDER_SITE_OTHER): Payer: BC Managed Care – PPO | Admitting: Pulmonary Disease

## 2020-02-02 ENCOUNTER — Ambulatory Visit (INDEPENDENT_AMBULATORY_CARE_PROVIDER_SITE_OTHER): Payer: BC Managed Care – PPO | Admitting: Internal Medicine

## 2020-02-02 DIAGNOSIS — J4540 Moderate persistent asthma, uncomplicated: Secondary | ICD-10-CM | POA: Diagnosis not present

## 2020-02-02 DIAGNOSIS — R05 Cough: Secondary | ICD-10-CM

## 2020-02-02 DIAGNOSIS — R053 Chronic cough: Secondary | ICD-10-CM

## 2020-02-02 LAB — NITRIC OXIDE: Nitric Oxide: 18

## 2020-02-02 NOTE — Progress Notes (Signed)
Virtual Visit via Video Note  I connected with Danielle Bond on 02/02/20 at  2:00 PM EDT by a video enabled telemedicine application and verified that I am speaking with the correct person using two identifiers.  Location: Patient: Home Provider: Office - [facility] - [address], [address], [place], [place] [address]  I discussed the limitations of evaluation and management by telemedicine and the availability of in person appointments. The patient expressed understanding and agreed to proceed. I also discussed with the patient that there may be a patient responsible charge related to this service. The patient expressed understanding and agreed to proceed.  Patient consented to consult via telephone: Yes People present and their role in pt care: Pt   History of Present Illness:  59 year old female never smoker followed in our office for asthma  Past medical history: Mitral valve prolapse, migraines, autoimmune hepatitis, dyslipidemia, aortic arthrosclerosis Smoking history: Never smoker Maintenance: Symbicort 160 Patient of Dr. Chase Caller  Chief complaint: Review of pulmonary function testing, FeNO, next available per Dr. Chase Caller  59 year old female never smoker followed in our office for asthma.  She is followed by Dr. Chase Caller.  Patient was last seen by Dr. Chase Caller on 12/17/2019 plan of care from that office visit was for the patient to obtain lab work of blood allergy profile, IgE as well as FeNO with Symbicort and full pulmonary function testing.  She was continued to remain on Symbicort.  She was encouraged to drink water whenever she has the urge to cough or use of sugarless lozenge, continue Flonase as well as nasal lavage.  She was encouraged to also have voice rest.  If no improvement with these interventions could consider course of gabapentin and referral to voice rehab.   Patient completing video visit with our office today.  Patient completed pulmonary function  test as well as FeNO today.  Patient did not use her Symbicort before.  We will review those results  Patient reporting today that she does not find have a dog that she lives with.  But she limits access to her bedroom.  She does not have any carpets.  She reports the cough has improved significantly since last visit.  Observations/Objective:  12/17/2019-respiratory allergy profile-elevations to cat dander, dog dander, silver birch, oak, Timothy grass, ragweed, IgE 158  05/29/2019-CBC with differential-eosinophils relative 1, eosinophils absolute 0.1  06/15/2019-CT chest with contrast-no pathologically enlarged lymph nodes in chest, abdomen or pelvis, aortic arthrosclerosis, image quality of lung somewhat degraded by respiratory motion, lungs are clear, airway unremarkable  02/02/20-pulmonary function test-FVC 2.15 (75% duty), postbronchodilator ratio 89, postbronchodilator FEV1 1.90 (85% predicted), no bronchodilator response, TLC 3.39 (76% predicted), DLCO 19.37 (109% predicted) >>>no inhalers  Lab Results  Component Value Date   NITRICOXIDE 18 02/02/2020  - no inhaler   Social History   Tobacco Use  Smoking Status Never Smoker  Smokeless Tobacco Never Used   Immunization History  Administered Date(s) Administered   Influenza Inj Mdck Quad Pf 03/23/2018   Influenza Inj Mdck Quad With Preservative 05/01/2019   Influenza Split 04/10/2012, 04/24/2013   Influenza, Seasonal, Injecte, Preservative Fre 04/15/2014   Moderna SARS-COVID-2 Vaccination 10/09/2019, 11/09/2019   PPD Test 10/05/2013   Pneumococcal Polysaccharide-23 04/22/2008, 04/23/2013   Tdap 07/23/2004, 07/22/2012   Varicella 07/22/2012    Assessment and Plan:  Asthma Plan: Continue Symbicort 160 Consider trial of daily antihistamine Continue nasal saline rinses would increase to 1-2 times daily Continue fluticasone nasal spray Continue to limit dog access to  bedrooms Vacuum well, have appropriate HEPA  filters and air to limit dog dander If patient has persistent flares could consider biologic medication in the future considering elevated IgE   Follow Up Instructions:  Return in about 3 months (around 05/04/2020), or if symptoms worsen or fail to improve, for Follow up with Dr. Purnell Shoemaker.    I discussed the assessment and treatment plan with the patient. The patient was provided an opportunity to ask questions and all were answered. The patient agreed with the plan and demonstrated an understanding of the instructions.   The patient was advised to call back or seek an in-person evaluation if the symptoms worsen or if the condition fails to improve as anticipated.  I provided 32 minutes of non-face-to-face time during this encounter.   Lauraine Rinne, NP

## 2020-02-02 NOTE — Progress Notes (Signed)
Full PFT performed today. °

## 2020-02-02 NOTE — Patient Instructions (Addendum)
You were seen today by Lauraine Rinne, NP  for:    1. Moderate persistent asthma without complication  Continue Symbicort 160 >>> 2 puffs in the morning right when you wake up, rinse out your mouth after use, 12 hours later 2 puffs, rinse after use >>> Take this daily, no matter what >>> This is not a rescue inhaler   Only use your albuterol as a rescue medication to be used if you can't catch your breath by resting or doing a relaxed purse lip breathing pattern.  - The less you use it, the better it will work when you need it. - Ok to use up to 2 puffs  every 4 hours if you must but call for immediate appointment if use goes up over your usual need - Don't leave home without it !!  (think of it like the spare tire for your car)   Please start taking a daily antihistamine:  >>>choose one of: zyrtec, claritin, allegra, or xyzal  >>>these are over the counter medications  >>>can choose generic option  >>>take daily  >>>this medication helps with allergies, post nasal drip, and cough   Start nasal saline rinses twice daily Use distilled water Shake well Get bottle lukewarm like a baby bottle  Continue fluticasone nasal spray  Local pollens: Tree pollen-seen more in spring Grass pollen-seen more than summertime Weeds-seen more in fall  ^^ you have all three  Perennial allergens:  Dust mite, cockroach, mold indoor, animal dander (you have dog and cat)  Mold-seen more mid summer and fall   Follow Up:    No follow-ups on file.   Please do your part to reduce the spread of COVID-19:      Reduce your risk of any infection  and COVID19 by using the similar precautions used for avoiding the common cold or flu:  Marland Kitchen Wash your hands often with soap and warm water for at least 20 seconds.  If soap and water are not readily available, use an alcohol-based hand sanitizer with at least 60% alcohol.  . If coughing or sneezing, cover your mouth and nose by coughing or sneezing into  the elbow areas of your shirt or coat, into a tissue or into your sleeve (not your hands). Langley Gauss A MASK when in public  . Avoid shaking hands with others and consider head nods or verbal greetings only. . Avoid touching your eyes, nose, or mouth with unwashed hands.  . Avoid close contact with people who are sick. . Avoid places or events with large numbers of people in one location, like concerts or sporting events. . If you have some symptoms but not all symptoms, continue to monitor at home and seek medical attention if your symptoms worsen. . If you are having a medical emergency, call 911.   [place] / e-Visit: [email or web address]         [facility]: [facility]: [phone]                   [facility]: [phone]     It is flu season:   >>> Best ways to protect herself from the flu: Receive the yearly flu vaccine, practice good hand hygiene washing with soap and also using hand sanitizer when available, eat a nutritious meals, get adequate rest, hydrate appropriately   Please contact the office if your symptoms worsen or you have concerns that  you are not improving.   Thank you for choosing [facility] for your healthcare, and for allowing [name] to partner with you on your healthcare journey. I am thankful to be able to provide care to you today.   Wyn Quaker FNP-C     Allergies, Adult An allergy means that your body reacts to something that bothers it (allergen). It is not a normal reaction. This can happen from something that you:  Eat.  Breathe in.  Touch. You can have an allergy (be allergic) to:  Outdoor things, like: ? Pollen. ? Grass. ? Weeds.  Indoor things, like: ? Dust. ? Smoke. ? Pet dander.  Foods.  Medicines.  Things that bother your skin,  like: ? Detergents. ? Chemicals. ? Latex.  Perfume.  Bugs. An allergy cannot spread from person to person (is not contagious). Follow these instructions at home:         Stay away from things that you know you are allergic to.  If you have allergies to things in the air, wash out your nose each day. Do it with one of these: ? A salt-water (saline) spray. ? A container (neti pot).  Take over-the-counter and prescription medicines only as told by your doctor.  Keep all follow-up visits as told by your doctor. This is important.  If you are at risk for a very bad allergy reaction (anaphylaxis), keep an auto-injector with you all the time. This is called an epinephrine injection. ? This is pre-measured medicine with a needle. You can put it into your skin by yourself. ? Right after you have a very bad allergy reaction, you or a person with you must give the medicine in less than a few minutes. This is an emergency.  If you have ever had a very bad allergy reaction, wear a medical alert bracelet or necklace. Your very bad allergy should be written on it. Contact a health care provider if:  Your symptoms do not get better with treatment. Get help right away if:  You have symptoms of a very bad allergy reaction. These include: ? A swollen mouth, tongue, or throat. ? Pain or tightness in your chest. ? Trouble breathing. ? Being short of breath. ? Dizziness. ? Fainting. ? Very bad pain in your belly (abdomen). ? Throwing up (vomiting). ? Watery poop (diarrhea). Summary  An allergy means that your body reacts to something that bothers it (allergen). It is not a normal reaction.  Stay away from things that make your body react.  Take over-the-counter and prescription medicines only as told by your doctor.  If you are at risk for a very bad allergy reaction, carry an auto-injector (epinephrine injection) all the time. Also, wear a medical alert bracelet or necklace so people  know about your allergy. This information is not intended to replace advice given to you by your health care provider. Make sure you discuss any questions you have with your health care provider. Document Revised: 10/28/2018 Document Reviewed: 10/22/2016 Elsevier Patient Education  [place].

## 2020-02-02 NOTE — Assessment & Plan Note (Addendum)
Plan: Continue Symbicort 160 Consider trial of daily antihistamine Continue nasal saline rinses would increase to 1-2 times daily Continue fluticasone nasal spray Continue to limit dog access to bedrooms Vacuum well, have appropriate HEPA filters and air to limit dog dander If patient has persistent flares could consider biologic medication in the future considering elevated IgE

## 2020-02-04 DIAGNOSIS — Z1231 Encounter for screening mammogram for malignant neoplasm of breast: Secondary | ICD-10-CM | POA: Diagnosis not present

## 2020-02-04 DIAGNOSIS — Z113 Encounter for screening for infections with a predominantly sexual mode of transmission: Secondary | ICD-10-CM | POA: Diagnosis not present

## 2020-02-04 DIAGNOSIS — Z1211 Encounter for screening for malignant neoplasm of colon: Secondary | ICD-10-CM | POA: Diagnosis not present

## 2020-02-04 DIAGNOSIS — Z6830 Body mass index (BMI) 30.0-30.9, adult: Secondary | ICD-10-CM | POA: Diagnosis not present

## 2020-02-04 DIAGNOSIS — M810 Age-related osteoporosis without current pathological fracture: Secondary | ICD-10-CM | POA: Diagnosis not present

## 2020-02-04 DIAGNOSIS — Z01419 Encounter for gynecological examination (general) (routine) without abnormal findings: Secondary | ICD-10-CM | POA: Diagnosis not present

## 2020-02-05 DIAGNOSIS — F4310 Post-traumatic stress disorder, unspecified: Secondary | ICD-10-CM | POA: Diagnosis not present

## 2020-02-23 DIAGNOSIS — F4310 Post-traumatic stress disorder, unspecified: Secondary | ICD-10-CM | POA: Diagnosis not present

## 2020-02-23 DIAGNOSIS — K5190 Ulcerative colitis, unspecified, without complications: Secondary | ICD-10-CM | POA: Diagnosis not present

## 2020-02-23 DIAGNOSIS — E119 Type 2 diabetes mellitus without complications: Secondary | ICD-10-CM | POA: Diagnosis not present

## 2020-03-04 ENCOUNTER — Ambulatory Visit: Payer: BC Managed Care – PPO | Admitting: Family Medicine

## 2020-03-04 ENCOUNTER — Telehealth: Payer: Self-pay

## 2020-03-04 DIAGNOSIS — H6691 Otitis media, unspecified, right ear: Secondary | ICD-10-CM | POA: Diagnosis not present

## 2020-03-04 NOTE — Telephone Encounter (Signed)
Patient called again stating she went to urgent care and was diasgnosed with ear infection and throat infection. Patient requested to speak to supervisor again.

## 2020-03-04 NOTE — Telephone Encounter (Signed)
Called pt to change her appt to virtual. Pt did not want to do virtual because she said her ear needed to be looked at in person. She said she was going to go to an urgent care, so her ear could be looked at in person. I told her that we could see her virtually, then if Dr. Jonni Sanger thinks she needs to be seen in person that we could have her in person. Pt was very unhappy and did not understand why for an ear infection she could not be seen in person. Pt states she is immunocompromised and does not need to be in urgent cares or the ED. I asked pt if she would like my supervisor to give her a call and she said yes. Pt thanked me for all that I have done and would be looking out for a call from [place].

## 2020-03-08 NOTE — Telephone Encounter (Signed)
I have spoken with patient in regard.    I have informed patient of [facility] protocol and have expressed understanding with patient.    Patient understood.

## 2020-03-14 DIAGNOSIS — F4310 Post-traumatic stress disorder, unspecified: Secondary | ICD-10-CM | POA: Diagnosis not present

## 2020-03-15 DIAGNOSIS — M7531 Calcific tendinitis of right shoulder: Secondary | ICD-10-CM | POA: Diagnosis not present

## 2020-03-15 DIAGNOSIS — M79644 Pain in right finger(s): Secondary | ICD-10-CM | POA: Diagnosis not present

## 2020-03-15 DIAGNOSIS — K5190 Ulcerative colitis, unspecified, without complications: Secondary | ICD-10-CM | POA: Diagnosis not present

## 2020-03-15 DIAGNOSIS — M810 Age-related osteoporosis without current pathological fracture: Secondary | ICD-10-CM | POA: Diagnosis not present

## 2020-03-15 LAB — PULMONARY FUNCTION TEST
DL/VA % pred: 136 %
DL/VA: 5.93 ml/min/mmHg/L
DLCO cor % pred: 109 %
DLCO cor: 19.37 ml/min/mmHg
DLCO unc % pred: 109 %
DLCO unc: 19.37 ml/min/mmHg
FEF 25-75 Post: 1.95 L/sec
FEF 25-75 Pre: 2.97 L/sec
FEF2575-%Change-Post: -34 %
FEF2575-%Pred-Post: 89 %
FEF2575-%Pred-Pre: 136 %
FEV1-%Change-Post: 0 %
FEV1-%Pred-Post: 85 %
FEV1-%Pred-Pre: 85 %
FEV1-Post: 1.9 L
FEV1-Pre: 1.89 L
FEV1FVC-%Change-Post: 1 %
FEV1FVC-%Pred-Pre: 112 %
FEV6-%Change-Post: -1 %
FEV6-%Pred-Post: 76 %
FEV6-%Pred-Pre: 78 %
FEV6-Post: 2.12 L
FEV6-Pre: 2.15 L
FEV6FVC-%Pred-Post: 103 %
FEV6FVC-%Pred-Pre: 103 %
FVC-%Change-Post: -1 %
FVC-%Pred-Post: 74 %
FVC-%Pred-Pre: 75 %
FVC-Post: 2.12 L
FVC-Pre: 2.15 L
Post FEV1/FVC ratio: 89 %
Post FEV6/FVC ratio: 100 %
Pre FEV1/FVC ratio: 88 %
Pre FEV6/FVC Ratio: 100 %
RV % pred: 66 %
RV: 1.18 L
TLC % pred: 76 %
TLC: 3.39 L

## 2020-03-22 ENCOUNTER — Ambulatory Visit: Payer: BC Managed Care – PPO | Admitting: Family Medicine

## 2020-03-22 DIAGNOSIS — H9201 Otalgia, right ear: Secondary | ICD-10-CM | POA: Diagnosis not present

## 2020-03-22 DIAGNOSIS — M2669 Other specified disorders of temporomandibular joint: Secondary | ICD-10-CM | POA: Diagnosis not present

## 2020-03-24 DIAGNOSIS — F4310 Post-traumatic stress disorder, unspecified: Secondary | ICD-10-CM | POA: Diagnosis not present

## 2020-03-31 DIAGNOSIS — E890 Postprocedural hypothyroidism: Secondary | ICD-10-CM | POA: Diagnosis not present

## 2020-03-31 DIAGNOSIS — M810 Age-related osteoporosis without current pathological fracture: Secondary | ICD-10-CM | POA: Diagnosis not present

## 2020-03-31 DIAGNOSIS — E119 Type 2 diabetes mellitus without complications: Secondary | ICD-10-CM | POA: Diagnosis not present

## 2020-04-07 DIAGNOSIS — E559 Vitamin D deficiency, unspecified: Secondary | ICD-10-CM | POA: Diagnosis not present

## 2020-05-02 DIAGNOSIS — E782 Mixed hyperlipidemia: Secondary | ICD-10-CM | POA: Diagnosis not present

## 2020-05-02 DIAGNOSIS — F4310 Post-traumatic stress disorder, unspecified: Secondary | ICD-10-CM | POA: Diagnosis not present

## 2020-05-02 NOTE — Progress Notes (Signed)
Patient referred by [name], MD for abnormal echocardiogram.  Subjective:   Danielle Bond, female    DOB: 1961/07/17, 59 y.o.   MRN: [id number]   Chief Complaint  Patient presents with  . Aortic atherosclerosis  . Hyperlipidemia  . Follow-up    3 month    59 y/o Caucasian female with hyperlipidemia, aortic and coronary atherrosclerosis, presyncope, Graves' disease, status post thyroidectomy, ulcerative colitis, h/o acute pancreatitis, migraine,  Patient.  She walks 2 miles 3 times a week.  She has occasional retrosternal chest pain lasting for few seconds, that does not correlate with exercise or activity necessarily.  Reviewed recent echo, the patient, details below.   Blood pressure elevated today.  Usually lower than this.   Current Outpatient Medications on File Prior to Visit  Medication Sig Dispense Refill  . albuterol (PROAIR HFA) 108 (90 Base) MCG/ACT inhaler Inhale 2 puffs into the lungs every 6 (six) hours as needed for wheezing or shortness of breath. 18 g 3  . budesonide-formoterol (SYMBICORT) 160-4.5 MCG/ACT inhaler Inhale 2 puffs into the lungs 2 (two) times daily. 1 Inhaler 5  . Cholecalciferol (VITAMIN D3) 25 MCG (1000 UT) CAPS Take 1 capsule by mouth daily.    Marland Kitchen ELDERBERRY PO Take 1 tablet by mouth daily.    Marland Kitchen EPINEPHrine 0.3 mg/0.3 mL IJ SOAJ injection Inject 0.3 mLs (0.3 mg total) into the muscle as needed for anaphylaxis. 1 each 1  . fluticasone (FLONASE) 50 MCG/ACT nasal spray PLACE 2 SPRAYS INTO BOTH NOSTRILS DAILY AS NEEDED FOR ALLERGIES OR RHINITIS. 48 mL 1  . folic acid (FOLVITE) 1 MG tablet Take 1 mg by mouth daily.    Marland Kitchen HUMIRA PEN 40 MG/0.4ML PNKT every 14 (fourteen) days.     Marland Kitchen levothyroxine (SYNTHROID, LEVOTHROID) 112 MCG tablet Take 112 mcg by mouth daily before breakfast.    . Multiple Vitamin (MULTIVITAMIN WITH MINERALS) TABS tablet Take 1 tablet by mouth daily.    . Multiple Vitamins-Minerals (GNP HAIR/SKIN/NAILS PO) Take by mouth.     . pantoprazole (PROTONIX) 40 MG tablet Take 40 mg by mouth daily.    . Riboflavin 100 MG CAPS Take by mouth.    . rosuvastatin (CRESTOR) 10 MG tablet Take 1 tablet (10 mg total) by mouth daily. 90 tablet 1  . vitamin C (ASCORBIC ACID) 500 MG tablet Take 500 mg by mouth daily.     No current facility-administered medications on file prior to visit.    Cardiovascular studies:  EKG 05/04/2020: Sinus rhythm 94 bpm Nonspecific T-wave abnormality  Echocardiogram 04/28/2019: Normal LV size and thickness. EF 55-60%. Impaired relaxation. Normal RV size and function. No significant valvular abnormality.   CTA chest 05/23/2018: No demonstrable pulmonary embolus. No thoracic aortic aneurysm or dissection. There is aortic atherosclerosis. Right subclavian artery arises distally to the other great vessels, passing posteriorly to the esophagus and impressing upon the esophagus as it passes to the right. No lung edema or consolidation. No evident thoracic adenopathy. Aortic Atherosclerosis (ICD10-I70.0).  CXR 04/17/2019: Mild cardiomegaly without acute abnormality of the lungs.  CTA coronary angiogram 12/2017: pLAD minimal atherosclerosis. Rest normal. Calcium score 6.   Recent labs: 05/02/2020: Chol 168, TG 82, HDL 63, LDL 90  01/18/2020: Chol 245, TG 126, HDL 75, LDL 148  Lipid panel 05/28/2019: Cholesterol 175, triglycerides 70, HDL 66, LDL 96.  Lipid panel 2017: Chol 184, TG 84, HDL 1813, LDL 84.  Lipid panel 2015: Chol 260, TG 73, HDL 59, LDL 193  Review of Systems  Cardiovascular: Negative for chest pain, dyspnea on exertion, leg swelling, palpitations and syncope.  Neurological: Positive for dizziness.       Vitals:   05/04/20 0832  BP: 140/80  Pulse: 91  Resp: 16  SpO2: 100%     Body mass index is 29.49 kg/m. Filed Weights   05/04/20 0832  Weight: 151 lb (68.5 kg)     Objective:   Physical Exam Vitals and nursing note reviewed.  Constitutional:       General: She is not in acute distress. Neck:     Vascular: No JVD.  Cardiovascular:     Rate and Rhythm: Normal rate and regular rhythm.     Pulses: Intact distal pulses.     Heart sounds: Normal heart sounds. No murmur heard.   Pulmonary:     Effort: Pulmonary effort is normal.     Breath sounds: Normal breath sounds. No wheezing or rales.           Assessment & Recommendations:   59 y/o Caucasian female with hyperlipidemia, aortic and coronary atherrosclerosis, presyncope, Graves' disease, status post thyroidectomy, ulcerative colitis, h/o acute pancreatitis, migraine,  Hyperlipidemia: LDL 146-->90 on Crestor.   Continue the same.  Repeat lipid panel in 1 year.  Aorta and coronary atherosclerosis: Minimal. Continue risk factor modification, especially lipid control. Not on Aspirin due to h/o GI bleeding with ulcerative colitis.   Elevated blood pressure without diagnosis of hypertension:  Generally lower than this.  Recommend home monitoring.  F/u in 1 year  Nigel Mormon, MD [facility] Pager: [phone] Office: [phone] If no answer Cell [phone]

## 2020-05-03 DIAGNOSIS — R197 Diarrhea, unspecified: Secondary | ICD-10-CM | POA: Diagnosis not present

## 2020-05-03 LAB — LIPID PANEL
Chol/HDL Ratio: 2.7 ratio (ref 0.0–4.4)
Cholesterol, Total: 168 mg/dL (ref 100–199)
HDL: 63 mg/dL (ref >39)
LDL Chol Calc (NIH): 90 mg/dL (ref 0–99)
Triglycerides: 82 mg/dL (ref 0–149)
VLDL Cholesterol Cal: 15 mg/dL (ref 5–40)

## 2020-05-04 ENCOUNTER — Ambulatory Visit: Payer: BC Managed Care – PPO | Admitting: Internal Medicine

## 2020-05-04 ENCOUNTER — Encounter: Payer: Self-pay | Admitting: Cardiology

## 2020-05-04 ENCOUNTER — Other Ambulatory Visit: Payer: Self-pay

## 2020-05-04 ENCOUNTER — Ambulatory Visit: Payer: BC Managed Care – PPO | Admitting: Cardiology

## 2020-05-04 VITALS — BP 140/80 | HR 91 | Resp 16 | Ht 60.0 in | Wt 151.0 lb

## 2020-05-04 DIAGNOSIS — I700 Atherosclerosis of aorta: Secondary | ICD-10-CM | POA: Diagnosis not present

## 2020-05-04 DIAGNOSIS — R030 Elevated blood-pressure reading, without diagnosis of hypertension: Secondary | ICD-10-CM | POA: Diagnosis not present

## 2020-05-04 DIAGNOSIS — E782 Mixed hyperlipidemia: Secondary | ICD-10-CM

## 2020-05-09 DIAGNOSIS — F4310 Post-traumatic stress disorder, unspecified: Secondary | ICD-10-CM | POA: Diagnosis not present

## 2020-05-16 ENCOUNTER — Telehealth: Payer: Self-pay

## 2020-05-16 NOTE — Telephone Encounter (Signed)
Patient wanted your opinion on getting her Covid Booster vaccine?

## 2020-05-16 NOTE — Telephone Encounter (Signed)
Im not sure she will qualify, based on the current CDC recommendations. However, I am sure the eligibility will be expanded soon. Once she is eligible, I recommend that she get it.  Thanks MJP

## 2020-05-23 DIAGNOSIS — R195 Other fecal abnormalities: Secondary | ICD-10-CM | POA: Diagnosis not present

## 2020-05-23 DIAGNOSIS — R140 Abdominal distension (gaseous): Secondary | ICD-10-CM | POA: Diagnosis not present

## 2020-05-23 DIAGNOSIS — R197 Diarrhea, unspecified: Secondary | ICD-10-CM | POA: Diagnosis not present

## 2020-05-23 DIAGNOSIS — K6389 Other specified diseases of intestine: Secondary | ICD-10-CM | POA: Diagnosis not present

## 2020-05-24 ENCOUNTER — Ambulatory Visit (INDEPENDENT_AMBULATORY_CARE_PROVIDER_SITE_OTHER): Payer: BC Managed Care – PPO | Admitting: Internal Medicine

## 2020-05-24 ENCOUNTER — Encounter: Payer: Self-pay | Admitting: Internal Medicine

## 2020-05-24 ENCOUNTER — Other Ambulatory Visit: Payer: Self-pay

## 2020-05-24 VITALS — BP 130/80 | HR 90 | Temp 97.4°F | Ht 60.0 in | Wt 151.4 lb

## 2020-05-24 DIAGNOSIS — J387 Other diseases of larynx: Secondary | ICD-10-CM

## 2020-05-24 DIAGNOSIS — J4540 Moderate persistent asthma, uncomplicated: Secondary | ICD-10-CM | POA: Diagnosis not present

## 2020-05-24 DIAGNOSIS — R768 Other specified abnormal immunological findings in serum: Secondary | ICD-10-CM | POA: Diagnosis not present

## 2020-05-24 DIAGNOSIS — Z23 Encounter for immunization: Secondary | ICD-10-CM

## 2020-05-24 DIAGNOSIS — R7689 Other specified abnormal immunological findings in serum: Secondary | ICD-10-CM

## 2020-05-24 DIAGNOSIS — R053 Chronic cough: Secondary | ICD-10-CM | POA: Diagnosis not present

## 2020-05-24 DIAGNOSIS — R769 Abnormal immunological finding in serum, unspecified: Secondary | ICD-10-CM

## 2020-05-24 NOTE — Progress Notes (Signed)
PCP Jani Gravel, MD  Neurologist-Dr. Krista Blue Infectious diseases Dr. Michel Bickers Orthopedic for right shoulder-Dr. Hildred Priest 06/18/2018  Chief Complaint  Patient presents with  . Pulmonary Consult    asthma, [name], NP referred her, she went to the ER and she had bronchitis with fever, she has constant fever and she has seen Infectious DIsease for this, she has a dry hacking cough    59 year old female with a chronic complex set of medical problems that includes hyperlipidemia, Graves' disease, status post thyroidectomy, ulcerative colitis, kidney stone and history of acute pancreatitis.  She also has long history of migraine headaches.  She presents with her husband for respiratory issues in the context of fever of unknown origin and bloody stools  She tells me that she was diagnosed with ulcerative colitis in [place] in 2007 when the used to live [place].  In 2013 she migrated to [place], [place] and had a bad flareup of ulcerative colitis for which she was admitted.  Since then she has been under the care of Dr. Collene Mares gastroenterologist and she has been on Lialda.  Overall she is doing really wel0 .then some few years ago she was started on Brio for diagnosis of asthma.  Details of this is unclear.  Overall doing well since then till September 2019 when she felt the Lialda stopped working on a subjective basis.  This is because since then every time she takes the Lialda she has abdominal pain.  She also has fever at night on a daily basis.  She also has bloody stools at least 7 times a day.  She has abdominal pain when she takes the [place].  Per her report Dr. Collene Mares has done a colonoscopy with biopsy that showed inflammation.  Therefore biologic is being considered.  However she is also increasing fatigue and shortness of breath associated with it.  Is mostly present on exertion relieved by rest.  Is also wheezing.  Overall she has had a significant deterioration.   Then towards the end of October 2019 she felt she had an asthma flareup and reported to the urgent care center where she was treated acutely.  But she represented within a week or 10 days to the ER and was given a diagnosis of acute bronchitis.  CT angiogram ruled out pulmonary embolism.  All these records have been reviewed by me and the images and results visualized.  She subsequently has seen Dr. Michel Bickers infectious diseases this week.  I reviewed his findings.  He is worried that the Lialda is causing eosinophilia.  He does not suspect infectious disease as a reason for the fever quantified on gold and HIV are negative.  Further blood results are pending.  Patient says she has had an extensive work-up.  She is also seen [facility] rheumatology department Dr. Joellyn Rued who apparently has done an extensive autoimmune panel and this is all negative.  Unclear is if vasculitis work-up has been done that includes ANCA and GBM.  Patient is really frustrated by the whole constellation of symptoms.  At this point in time a biologic agent is being considered for ulcerative colitis but apparently the fever is being considered is unusual in the setting of ulcerative colitis and therefore she is going through the work-up  Exam nitric oxide is 21 ppb and this is on Breo today and is normal.  Recent labs June 12, 2018 done at [facility] rheumatology: Sjogren antibody centromere  antibody, Jo 1 antibody, chromatin antibody, SSA, SSB, anti-scleroderma antibody, Smith antibody, double-stranded DNA antibody and RNP antibody and ESR all normal.  TSH is normal.  Hemoglobin is 12 g%.  Your sniffles continue to be high at 13.6% of her total white count of 7400.  Creatinine is normal.  Urine analysis appears normal.  Results for AMAURIA, YOUNTS (MRN [id number]) as of 06/18/2018 09:18  Ref. Range 11/27/2013 12:40 03/10/2014 18:52 03/16/2014 16:42 05/23/2018 05:31 06/16/2018 15:10    Eosinophils Absolute Latest Ref Range: 15 - 500 cells/uL 0.2 0.4 0.2 0.6 (H) 385   Results for ROMINA, DIVIRGILIO (MRN [id number]) as of 06/18/2018 09:18  Ref. Range 10/05/2013 14:36 03/16/2014 16:42  Anti Nuclear Antibody(ANA) Latest Ref Range: NEGATIVE  POS (A)   ANA Pattern 1 Unknown HOMOGENOUS (A)   ANA Titer 1 Latest Ref Range: <1:40   1:1280 (H)   ds DNA Ab Latest Units: IU/mL  1  C3 Complement Latest Ref Range: 90 - 180 mg/dL  116  C4 Complement Latest Ref Range: 10 - 40 mg/dL  18   Most recent CT scan of the chest angiogram May 23, 2018: Personally visualized.  Lung fields look fairly benign.  OV 07/29/2018  Subjective:  Patient ID: Danielle Bond, female , DOB: [birth year] , age 63 y.o. , MRN: [id number] , ADDRESS: [address] [place] [address]   07/29/2018 -   Chief Complaint  Patient presents with  . Follow-up    Pt states she is doing better since last visit. States she still has an occ dry cough but nothing like last visit. Pt states she does have some SOB/chest tightness today due to the weather.     HPI Danielle Bond 59 y.o. -presents with her husband for chronic cough and fever of unknown origin in the setting of ulcerative colitis and Lialda treatment  After her last visit we reviewed autoimmune panel from primary rheumatologist and this was negative.  We performed vasculitis panel and this was negative.  In the interim she is seen infectious disease specialist Dr. Megan Salon and apparently no infection present for her fever of unknown origin.  Discussions were held with Dr. Collene Mares.  She also discussed with a gastroenterologist at [facility] and the thought process was that maybe the fevers are indeed coming from ulcerative colitis although this is very rare.  This is because autoimmune, vasculitis and infectious work-up was negative.  On July 01, 2018 patient's Lialda was stopped and she has been prescribed budesonide oral treatment.  Subsequently  after this within a few weeks her cough is significantly improved.  She not having any more night sweats other than one at night.  This is 80% better.  And she is also feeling a lot better.  Apparently starting Entyvio biologic for ulcerative colitis is in discussion but she is somewhat got trepidation about this given the fact it is an infusion.  She will meet with Dr. Collene Mares about this.  Overall at this point she is feeling well no respiratory complaints other than the mild cough.  She is asking for referral to primary care physician.  Her current primary care physician is apparently out of the office for an extended period of time not otherwise specified.   OV 04/09/2019  Subjective:  Patient ID: Danielle Bond, female , DOB: 1960/10/01 , age 84 y.o. , MRN: [id number] , ADDRESS: [address] [place] [id number]   04/09/2019 -  chronic cough and fever of unknown origin in the  setting of ulcerative colitis and Lialda treatment    HPI Danielle Bond 59 y.o. - cough was resolved. Now back 1.5 weeks ago. Dry cough. Hacky cough. Sometimes she does bring sputum. Annoying. Has on and off migraine headaches. No fever, No hemoptysis . No anosmia. No ageusia. No covid exposures but daughter had strep throat 2 weeks ago (confirmed strep with confirmed negative covid). No sore throat. Throat does feel itchy.  Does NOT want to do antibiotics. Doing low risk covid activities only  Re UC - doing Humira by Dr Therisa Doyne since October 01, 2018 - q 2 weeks. .  Fevers went away in dec 2019. UC doing ok  OV 04/16/2019  Subjective:  Patient ID: Danielle Bond, female , DOB: Feb 17, 1961 , age 33 y.o. , MRN: [id number] , ADDRESS: [address] [place] [id number]   04/16/2019 -   Chief Complaint  Patient presents with  . Follow-up    Pt c/o prod cough with yellow mucus x 2 weeks. Pt also c/o left sided discomfort on lateral chest with inspiration. Pt states due to the discomfort she feels she cant take a full  breath in. Pt denies f/c/s and swelling.    Acute visit  HPI Danielle Bond 59 y.o. -is here for acute visit.  Originally seen in  Nov2019 for fever of unknown origin.  At that time ulcerative colitis was suspected along with Lialda because she had some eosinophilia.  She has been on Breo.  She tells me that her fever and cough broke and it was unrelated to stopping Lialda.  She then switched gastroenterologist and has been on care with Dr. Therisa Doyne.  She is on Humira but she tells me the fever and cough stopped even before she went on Humira.  She had been doing well.  Approximately 3 weeks ago or so daughter developed strep throat [apparently COVID negative].  Then approximately 2 weeks ago she developed a cough.  We did do a telephone visit.  At that time she opted for expectant follow-up.  She is here acutely because the cough is persistent.  She also has some yellow sputum.  A few days ago she did feel like a muscle pull in the left infra-axillary area with the cough.  This is resolved.  Early this morning she had very mild symptoms of the same.  Therefore she is here but there is no wheezing or shortness of breath.  There is no pleuritic chest pain.  There is no hemoptysis no pedal edema.  She continues with the Brio.  We did do a COVID antigen testing/PCR testing and this was negative on 10 April 2019.  In May 2020 her COVID serology for IgG was negative.  She is interested in a chest x-ray and empiric treatment with antibiotics.  We are unable to do an exhaled nitric oxide test because of the national [place] emergency and local site restrictions.   COVID negative 04/10/2019 - negtaive Results for JAQUELYN, SAKAMOTO (MRN [id number]) as of 04/16/2019 15:48  Ref. Range 12/17/2018 09:18  SARS CoV2 AB IGG Unknown NEGATIVE    Results for ZALA, DEGRASSE (MRN [id number]) as of 04/16/2019 15:48  Ref. Range 06/18/2018 10:17 06/26/2018 01:15 06/26/2018 14:29  Eosinophils Absolute Latest Ref Range: 15 - 500  cells/uL 0.9 (H) 0.4 535 (H)  Results for ONEIKA, SIMONIAN (MRN [id number]) as of 04/16/2019 15:48  Ref. Range 06/18/2018 10:17  IgE (Immunoglobulin E), Serum Latest Ref Range: <OR=114 kU/L 74   Results for KAHLEN, BOYDE (MRN  [id number]) as of 04/16/2019 15:48  Ref. Range 08/15/2012 19:14 10/05/2013 14:36 03/16/2014 16:42 06/26/2018 14:29 07/02/2018 10:42  Anti Nuclear Antibody (ANA) Latest Ref Range: Negative   POS (A)   Negative  ANA Pattern 1 Unknown  HOMOGENOUS (A)     ANA Titer 1 Latest Ref Range: <1:40    1:1280 (H)     ds DNA Ab Latest Units: IU/mL   1    GBM Ab Latest Units: AI    <1.0 <1.0  Mitochondrial M2 Ab, IgG Latest Ref Range: <0.91  1.14 (H)      Myeloperoxidase Abs Latest Units: AI     <1.0  Serine Protease 3 Latest Units: AI     <1.0  S cerevisiae IgG ab Latest Ref Range: <=20.0 Units 12.7      S cerevisiae IgA ab Latest Ref Range: <=20.0 Units 18.3       ROS  October 07, 2019:.  Visit with nurse practitioner Tammy Parrott  59 year old female never smoker followed for asthma, eosinophilia (possibly secondary to Lialda )  Medical history significant for Graves' disease, ulcerative colitis (now on Humira) and pancreatitis Today's video visit is for 4 week follow up for asthma and chronic cough .  Last visit patient was having increased cough.  She was changed from [facility] to Symbicort.  She says she is feeling better.  Feels that she can take in a clear breath and also cough has decreased.  She is practicing vocal cord relaxation techniques.  Feels that this is also helping.  She is using saline nasal rinses and this also seems to decrease her nasal congestion and postnasal drip.  She says overall she is feeling better.    OV 12/17/2019  Subjective:  Patient ID: Danielle Bond, female , DOB: 05-12-1961 , age 73 y.o. , MRN: [id number] , ADDRESS: [address] [place] [address]   12/17/2019 -   Chief Complaint  Patient presents with  . Follow-up    Cough      HPI Talani Brazee 59 y.o. -presents for evaluation of chronic cough and follow-up.  After last saw her nurse practitioner saw her and it is documented above.  This was in March 2021.  Her symptoms had improved with cough neuropathy advised.  Patient was reluctant to undergo gabapentin.  She now returns for follow-up.  She tells me the cough is deteriorated with the change in weather from spring to summer and the humidity.  She feels she has asthma.  This is despite taking Symbicort.  She demonstrated a cough for me and it has a laryngeal quality.  She admits that throat is very dry and she feels it to be itchy.  There is also a tickle in the throat.  Cough does not bother her at night when she sleeping.  Otherwise no new problems.  Overall she feels her health is improved and her new immunosuppression is working well.   OV 05/24/2020  Subjective:  Patient ID: Danielle Bond, female , DOB: 02/26/1961 , age 55 y.o. , MRN: [id number] , ADDRESS: [address] [place] [address] PCP [name], MD Patient Care Team: [name], MD as PCP - General (Family Medicine) Lahoma Rocker, MD as Consulting Physician (Rheumatology) Marshell Garfinkel, MD as Consulting Physician (Pulmonary Disease) Brand Males, MD as Consulting Physician (Pulmonary Disease) Jacelyn Pi, MD as Referring Physician (Endocrinology) Jolene Provost, PA-C as Physician Assistant (Physician Assistant) Delsa Bern, MD as Consulting Physician (Obstetrics and Gynecology)  This Provider  for this visit: Treatment Team:  Attending Provider: Brand Males, MD    05/24/2020 -   Chief Complaint  Patient presents with  . Follow-up    Chronic cough, Asthma     HPI Danielle Bond 59 y.o. -presents for follow-up of her residual chronic cough.  Last seen in May 2021.  At that time was concerned about residual clotting coughing due to cough neuropathy.  Patient is reluctant to add more medications.   We subjected her to allergy work-up.  She is here to review the results.  Her IgE is elevated.  She has a dog in the house and dog dander is significantly positive.  She is also allergic to few other things documented below.  She states that her cough happens during the spring and the fall consistent with the allergy season.  She feels that the allergies are driving this.  She is again somewhat reluctant to have more medications.  We discussed potential biologic therapies for asthma.  She is open to listening to this.  But she is also interested in like other options.  She is willing to see an allergist.  Reviewed her vaccination status.  She is up-to-date with her vaccines but she could benefit from a pneumonia vaccine especially because she is immunosuppressed and she tells me it is greater than 10 years since she took her pneumonia vaccine not otherwise specified.   Lab Results  Component Value Date   NITRICOXIDE 18 02/02/2020   PFT Results Latest Ref Rng & Units 12/17/2019  FVC-Pre L 2.15  FVC-Predicted Pre % 75  FVC-Post L 2.12  FVC-Predicted Post % 74  Pre FEV1/FVC % % 88  Post FEV1/FCV % % 89  FEV1-Pre L 1.89  FEV1-Predicted Pre % 85  FEV1-Post L 1.90  DLCO uncorrected ml/min/mmHg 19.37  DLCO UNC% % 109  DLCO corrected ml/min/mmHg 19.37  DLCO COR %Predicted % 109  DLVA Predicted % 136  TLC L 3.39  TLC % Predicted % 76  RV % Predicted % 66   Results for Danielle, Bond (MRN [id number]) as of 05/24/2020 10:17  Ref. Range 12/17/2019 10:45  Sheep Sorrel IgE Latest Units: kU/L <0.10  Pecan/Hickory Tree IgE Latest Units: kU/L <0.10  IgE (Immunoglobulin E), Serum Latest Ref Range: <OR=114 kU/L 158 (H)  Allergen, D pternoyssinus,d7 Latest Units: kU/L <0.10  Cat Dander Latest Units: kU/L 2.89 (H)  Dog Dander Latest Units: kU/L 22.00 (H)  [place] Grass Latest Units: kU/L <0.10  Johnson Grass Latest Units: kU/L <0.10  Timothy Grass Latest Units: kU/L 0.16 (H)  Cockroach Latest Units:  kU/L <0.10  Aspergillus fumigatus, m3 Latest Units: kU/L <0.10  Allergen, Comm Silver Wendee Copp, t9 Latest Units: kU/L 1.71 (H)  Allergen, Cottonwood, t14 Latest Units: kU/L <0.10  Elm IgE Latest Units: kU/L <0.10  Allergen, Mulberry, t76 Latest Units: kU/L <0.10  Allergen, Oak,t7 Latest Units: kU/L 0.88 (H)  COMMON RAGWEED (SHORT) (W1) IGE Latest Units: kU/L 0.87 (H)  Allergen, Mouse Urine Protein, e78 Latest Units: kU/L <0.10  D. farinae Latest Units: kU/L <0.10  Allergen, Cedar tree, t12 Latest Units: kU/L <0.10  Box Elder IgE Latest Units: kU/L <0.10  Rough Pigweed  IgE Latest Units: kU/L <0.10   Results for Danielle, Bond (MRN [id number]) as of 05/24/2020 10:17  Ref. Range 12/17/2019 10:45  IgE (Immunoglobulin E), Serum Latest Ref Range: <OR=114 kU/L 158 (H)   ROS - per HPI     has a past medical history of Asthma, Chronic kidney disease, pancreatitis (06/16/2018),  Kidney stone, Kidney stone, Migraine, Orthostatic hypotension, Osteopenia (03/31/2012), and Ulcerative colitis ([place]) (03/31/2012).   reports that she has never smoked. She has never used smokeless tobacco.  Past Surgical History:  Procedure Laterality Date  . CESAREAN SECTION     x3  . CHOLECYSTECTOMY    . COLONOSCOPY    . LITHOTRIPSY    . THYROIDECTOMY     1997    Allergies  Allergen Reactions  . Almond Oil Hives and Shortness Of Breath  . Lialda [Mesalamine] Other (See Comments)    Vomiting and rectal bleeding  . Other Anaphylaxis    Almonds, tree nuts  . Aspirin Hives and Nausea Only    Stomach cramps  . Hydromorphone Hcl Other (See Comments)    Caused Enzyme levels to go up  . Penicillins Hives and Swelling    Has patient had a PCN reaction causing immediate rash, facial/tongue/throat swelling, SOB or lightheadedness with hypotension: Yes Has patient had a PCN reaction causing severe rash involving mucus membranes or skin necrosis: No Has patient had a PCN reaction that required hospitalization: No Has  patient had a PCN reaction occurring within the last 10 years: No  Stomach cramps If all of the above answers are "NO", then may proceed with Cephalosporin use.   . Acetaminophen Other (See Comments)    Has autoimmune hepatitis, wants to avoid APAP  . Apriso [Mesalamine Er] Nausea And Vomiting  . Corticosteroids Other (See Comments)    Had acute pancreatitis x 2 after steroid use.  . Imitrex [Sumatriptan] Swelling    Injection caused swelling  . Stadol [Butorphanol] Other (See Comments)    hallucinates  . Topamax [Topiramate] Other (See Comments)    Kidney stones  . Vancomycin Hives  . Azathioprine Rash  . Pepcid [Famotidine] Nausea And Vomiting    Makes acid worse  . Prednisone Nausea And Vomiting    All steroids causes nausea and pancreatitis     Immunization History  Administered Date(s) Administered  . Influenza Inj Mdck Quad Pf 03/23/2018  . Influenza Inj Mdck Quad With Preservative 05/01/2019  . Influenza Split 04/10/2012, 04/24/2013  . Influenza, Seasonal, Injecte, Preservative Fre 04/15/2014  . Moderna SARS-COVID-2 Vaccination 10/09/2019, 11/09/2019  . PPD Test 10/05/2013  . Pneumococcal Polysaccharide-23 04/22/2008, 04/23/2013  . Tdap 07/23/2004, 07/22/2012  . Varicella 07/22/2012    Family History  Problem Relation Age of Onset  . Cervical cancer Paternal Aunt   . Heart disease Paternal Grandmother   . Heart disease Mother   . Dementia Mother   . Thyroid disease Mother   . Osteoarthritis Mother   . Asthma Mother   . Heart attack Father   . Breast cancer Cousin   . Heart attack Brother   . Heart disease Brother      Current Outpatient Medications:  .  albuterol (PROAIR HFA) 108 (90 Base) MCG/ACT inhaler, Inhale 2 puffs into the lungs every 6 (six) hours as needed for wheezing or shortness of breath., Disp: 18 g, Rfl: 3 .  Cholecalciferol 1.25 MG (50000 UT) capsule, Take 5,000 Units by mouth daily., Disp: , Rfl:  .  ELDERBERRY PO, Take 1 tablet by mouth  daily., Disp: , Rfl:  .  EPINEPHrine 0.3 mg/0.3 mL IJ SOAJ injection, Inject 0.3 mLs (0.3 mg total) into the muscle as needed for anaphylaxis., Disp: 1 each, Rfl: 1 .  fluticasone (FLONASE) 50 MCG/ACT nasal spray, PLACE 2 SPRAYS INTO BOTH NOSTRILS DAILY AS NEEDED FOR ALLERGIES OR RHINITIS., Disp: 48 mL,  Rfl: 1 .  folic acid (FOLVITE) 1 MG tablet, Take 1 mg by mouth daily., Disp: , Rfl:  .  HUMIRA PEN 40 MG/0.4ML PNKT, every 14 (fourteen) days. , Disp: , Rfl:  .  ibandronate (BONIVA) 150 MG tablet, Take by mouth., Disp: , Rfl:  .  levothyroxine (SYNTHROID, LEVOTHROID) 112 MCG tablet, Take 112 mcg by mouth daily before breakfast., Disp: , Rfl:  .  Multiple Vitamin (MULTIVITAMIN WITH MINERALS) TABS tablet, Take 1 tablet by mouth daily., Disp: , Rfl:  .  Multiple Vitamins-Minerals (GNP HAIR/SKIN/NAILS PO), Take by mouth., Disp: , Rfl:  .  pantoprazole (PROTONIX) 40 MG tablet, Take 40 mg by mouth daily., Disp: , Rfl:  .  Riboflavin 100 MG CAPS, Take by mouth., Disp: , Rfl:  .  rosuvastatin (CRESTOR) 10 MG tablet, Take 1 tablet (10 mg total) by mouth daily., Disp: 90 tablet, Rfl: 1 .  vitamin C (ASCORBIC ACID) 500 MG tablet, Take 500 mg by mouth daily., Disp: , Rfl:       Objective:   Vitals:   05/24/20 1024  BP: 130/80  Pulse: 90  Temp: (!) 97.4 F (36.3 C)  TempSrc: Oral  SpO2: 99%  Weight: 151 lb 6.4 oz (68.7 kg)  Height: 5' (1.524 m)    Estimated body mass index is 29.57 kg/m as calculated from the following:   Height as of this encounter: 5' (1.524 m).   Weight as of this encounter: 151 lb 6.4 oz (68.7 kg).  @WEIGHTCHANGE @  [employer]   05/24/20 1024  Weight: 151 lb 6.4 oz (68.7 kg)     Physical Exam General: No distress. pleasant Neuro: Alert and Oriented x 3. GCS 15. Speech normal Psych: Pleasant Resp:  Barrel Chest - no.  Wheeze - no, Crackles - no, No overt respiratory distress CVS: Normal heart sounds. Murmurs - no Ext: Stigmata of Connective Tissue Disease -  no HEENT: Normal upper airway. PEERL +. No post nasal drip        Assessment:       ICD-10-CM   1. Chronic cough  R05.3   2. Irritable larynx  J38.7   3. Moderate persistent asthma without complication  K93.81   4. Elevated IgE level  R76.8   5. Positive allergy test  R76.9        Plan:     Patient Instructions     ICD-10-CM   1. Chronic cough  R05.3   2. Irritable larynx  J38.7   3. Moderate persistent asthma without complication  W29.93   4. Elevated IgE level  R76.8   5. Positive allergy test  R76.9    For cough neuropathy/irritable larynx was my initial consideration, your history that cough gets worse in spring and fall and now positive blood allergy test - suggests ongoing allergies/airway inflammation as reason for cough  Plan -Cotninue symbicort as before -Every time you have the urge to cough drink water or suck on sugarless lozenge -Take 2-3 days of complete voice rest while you are not whispering or talking  - keep doing this monthly -Continue Flonase and nasal lavage - Pneumovax vaccine 05/24/2020  - Need to consider step up asthma or allergy therapy  - refer [facility] (dr Remus Blake or Dr Harold Hedge) for allergy evaluation to decide on allergy shot v biologic - will hold off on bronchoscopy   Follow-up -3-4 months or sooner if needed     SIGNATURE    Dr. Brand Males, M.D., F.C.C.P,  Pulmonary  and Critical Care Medicine Staff Physician, [place] Director - Interstitial Lung Disease  Program  Pulmonary [place] at [place], [place], [address]  Pager: [phone], If no answer or between  15:00h - 7:00h: call [phone] Telephone: [phone]  10:53 AM 05/24/2020

## 2020-05-24 NOTE — Addendum Note (Signed)
Addended byAdalberto Cole M on: 05/24/2020 11:00 AM   Modules accepted: Orders

## 2020-05-24 NOTE — Patient Instructions (Addendum)
ICD-10-CM   1. Chronic cough  R05.3   2. Irritable larynx  J38.7   3. Moderate persistent asthma without complication  V74.82   4. Elevated IgE level  R76.8   5. Positive allergy test  R76.9    For cough neuropathy/irritable larynx was my initial consideration, your history that cough gets worse in spring and fall and now positive blood allergy test - suggests ongoing allergies/airway inflammation as reason for cough  Plan -Cotninue symbicort as before -Every time you have the urge to cough drink water or suck on sugarless lozenge -Take 2-3 days of complete voice rest while you are not whispering or talking  - keep doing this monthly -Continue Flonase and nasal lavage - Pneumovax vaccine 05/24/2020  - Need to consider step up asthma or allergy therapy  - refer [facility] (dr Remus Blake or Dr Harold Hedge) for allergy evaluation to decide on allergy shot v biologic - will hold off on bronchoscopy   Follow-up -3-4 months or sooner if needed

## 2020-05-24 NOTE — Addendum Note (Signed)
Addended byCoralie Keens on: 05/24/2020 11:06 AM   Modules accepted: Orders

## 2020-05-25 ENCOUNTER — Other Ambulatory Visit: Payer: Self-pay | Admitting: Internal Medicine

## 2020-05-26 ENCOUNTER — Other Ambulatory Visit: Payer: Self-pay | Admitting: Internal Medicine

## 2020-05-26 ENCOUNTER — Telehealth: Payer: Self-pay | Admitting: Internal Medicine

## 2020-05-26 MED ORDER — BUDESONIDE-FORMOTEROL FUMARATE 160-4.5 MCG/ACT IN AERO: 2.0000 | INHALATION_SPRAY | Freq: Two times a day (BID) | RESPIRATORY_TRACT | 12 refills | Status: DC

## 2020-05-26 NOTE — Telephone Encounter (Signed)
Rx for symbicort 160 has been sent to preferred pharmacy.  Patient is aware and voiced her understanding.  Nothing further needed.

## 2020-06-02 ENCOUNTER — Ambulatory Visit: Payer: Self-pay

## 2020-06-02 ENCOUNTER — Encounter: Payer: Self-pay | Admitting: Orthopedic Surgery

## 2020-06-02 ENCOUNTER — Ambulatory Visit (INDEPENDENT_AMBULATORY_CARE_PROVIDER_SITE_OTHER): Payer: BC Managed Care – PPO | Admitting: Physician Assistant

## 2020-06-02 VITALS — Ht 60.0 in | Wt 151.0 lb

## 2020-06-02 DIAGNOSIS — G8929 Other chronic pain: Secondary | ICD-10-CM

## 2020-06-02 DIAGNOSIS — M5442 Lumbago with sciatica, left side: Secondary | ICD-10-CM

## 2020-06-02 MED ORDER — LIDOCAINE HCL 1 % IJ SOLN
5.0000 mL | INTRAMUSCULAR | Status: AC | PRN
  Administered 2020-06-02: 5 mL

## 2020-06-02 MED ORDER — METHYLPREDNISOLONE ACETATE 40 MG/ML IJ SUSP
40.0000 mg | INTRAMUSCULAR | Status: AC | PRN
  Administered 2020-06-02: 40 mg via INTRA_ARTICULAR

## 2020-06-02 NOTE — Progress Notes (Signed)
Office Visit Note   Patient: Danielle Bond           Date of Birth: [birth year]           MRN: [id number] Visit Date: 06/02/2020              Requested by: [name], MD [address] [place],  [place] [address] PCP: [name], MD  Chief Complaint  Patient presents with   Left Shoulder - Pain   Lower Back - Pain      HPI:  This is a pleasant 58 year old woman with complaints of left shoulder pain and decreased range of motion secondary to pain.  She has a history of right shoulder rotator cuff tendinitis and has had an injection in the past and done very well.  She is wondering if she can have an injection into the left shoulder.  She also is complaining of pain radiating down her buttock into her leg.  She denies any paresthesias or weakness.  She has had sciatica in the past and believes this is secondary to sciatica.  She just did this a couple days ago when she was carrying and moving boxes Assessment & Plan: Visit Diagnoses:  1. Chronic left-sided low back pain with left-sided sciatica     Plan: Patient cannot take oral steroids or Tylenol.  I could give her for her back some Robaxin but she thinks she just like to wait and see how she does.  She will try ice and heat and her TENS unit.  She would like to go forward with an injection today into her left shoulder she would only allow The amount of stool used as its been used in the past.  She feels she is very sensitive with her blood sugars if a full dose is used  Follow-Up Instructions: No follow-ups on file.   Ortho Exam  Patient is alert, oriented, no adenopathy, well-dressed, normal affect, normal respiratory effort. Focused examination of her lower back she has full extension flexion tenderness in the buttock radiating down her leg.  She has 5/5 strength with plantar flexion dorsiflexion negative straight leg raise no abnormalities palpated in her spine or pain.  Left shoulder decreased internal rotation  forward elevation.  Strength 4 out of 5.  She does have positive impingement findings no cellulitis no redness  Imaging: No results found. No images are attached to the encounter.  Labs: Lab Results  Component Value Date   ESRSEDRATE 64 (H) 07/09/2019   ESRSEDRATE 50 (H) 06/26/2018   ESRSEDRATE 25 (H) 03/16/2014   CRP 85.3 (H) 06/26/2018   REPTSTATUS 03/12/2014 FINAL 03/10/2014   CULT  03/10/2014    Multiple bacterial morphotypes present, none predominant. Suggest appropriate recollection if clinically indicated. Performed at [facility] 03/22/2014     Lab Results  Component Value Date   ALBUMIN 3.8 05/29/2019   ALBUMIN 3.9 06/26/2018   ALBUMIN 4.1 05/23/2018    Lab Results  Component Value Date   MG 1.6 08/15/2012   No results found for: VD25OH  No results found for: PREALBUMIN CBC EXTENDED Latest Ref Rng & Units 05/29/2019 04/16/2019 03/03/2019  WBC 4.0 - 10.5 K/uL 6.7 5.6 6.0  RBC 3.87 - 5.11 Mil/uL 4.45 4.43 4.67  HGB 12.0 - 15.0 g/dL 13.0 12.9 13.2  HCT 36 - 46 % 39.0 38.9 41.9  PLT 150 - 400 K/uL 329.0 293.0 281  NEUTROABS 1.4 - 7.7 K/uL 4.6 3.6 -  LYMPHSABS 0.7 - 4.0 K/uL 1.6 1.6 -     Body mass index is 29.49 kg/m.  Orders:  Orders Placed This Encounter  Procedures   XR Lumbar Spine 2-3 Views   No orders of the defined types were placed in this encounter.    Procedures: Large Joint Inj: L subacromial bursa on 06/02/2020 10:26 AM Indications: diagnostic evaluation and pain Details: 22 G 1.5 in needle, posterior approach  Arthrogram: No  Medications: 5 mL lidocaine 1 %; 40 mg methylPREDNISolone acetate 40 MG/ML Outcome: tolerated well, no immediate complications Procedure, treatment alternatives, risks and benefits explained, specific risks discussed. Consent was given by the patient.      Clinical Data: No additional findings.  ROS:  All other systems negative, except as noted in the HPI. Review of  Systems  Objective: Vital Signs: Ht 5' (1.524 m)    Wt 151 lb (68.5 kg)    LMP 03/03/2013    BMI 29.49 kg/m   Specialty Comments:  No specialty comments available.  PMFS History: Patient Active Problem List   Diagnosis Date Noted   Elevated blood pressure reading without diagnosis of hypertension 01/20/2020   Atherosclerosis of native coronary artery of native heart without angina pectoris 06/08/2019   Aortic atherosclerosis ([place]) 06/08/2019   Mixed hyperlipidemia 05/02/2019   Postural dizziness with presyncope 05/02/2019   HSV-2 (herpes simplex virus 2) infection 11/04/2018   Dyslipidemia 08/15/2018   Hypothyroidism s/p total thyroidectomy 1997 08/15/2018   Autoimmune hepatitis ([place]) 08/16/2012   Asthma 03/31/2012   Ulcerative colitis ([place]) 03/31/2012   History of renal calculi 03/31/2012   Migraines 03/31/2012   Mitral valve prolapse    Past Medical History:  Diagnosis Date   Asthma    Chronic kidney disease    Hx of pancreatitis 06/16/2018   Kidney stone    Kidney stone    Migraine    Orthostatic hypotension    Osteopenia 03/31/2012   Ulcerative colitis ([place]) 03/31/2012    Family History  Problem Relation Age of Onset   Cervical cancer Paternal Aunt    Heart disease Paternal Grandmother    Heart disease Mother    Dementia Mother    Thyroid disease Mother    Osteoarthritis Mother    Asthma Mother    Heart attack Father    Breast cancer Cousin    Heart attack Brother    Heart disease Brother     Past Surgical History:  Procedure Laterality Date   CESAREAN SECTION     x3   CHOLECYSTECTOMY     COLONOSCOPY     LITHOTRIPSY     THYROIDECTOMY     1997   Social History   Occupational History   Occupation: Self-employed  Tobacco Use   Smoking status: Never Smoker   Smokeless tobacco: Never Used  Scientific laboratory technician Use: Never used  Substance and Sexual Activity   Alcohol use: No   Drug use: No   Sexual  activity: Yes

## 2020-06-09 DIAGNOSIS — F4310 Post-traumatic stress disorder, unspecified: Secondary | ICD-10-CM | POA: Diagnosis not present

## 2020-06-11 ENCOUNTER — Other Ambulatory Visit: Payer: Self-pay | Admitting: Family Medicine

## 2020-06-20 DIAGNOSIS — K5190 Ulcerative colitis, unspecified, without complications: Secondary | ICD-10-CM | POA: Diagnosis not present

## 2020-06-23 ENCOUNTER — Ambulatory Visit: Payer: BC Managed Care – PPO | Admitting: Orthopedic Surgery

## 2020-06-24 DIAGNOSIS — F4310 Post-traumatic stress disorder, unspecified: Secondary | ICD-10-CM | POA: Diagnosis not present

## 2020-06-24 DIAGNOSIS — H66004 Acute suppurative otitis media without spontaneous rupture of ear drum, recurrent, right ear: Secondary | ICD-10-CM | POA: Diagnosis not present

## 2020-06-24 DIAGNOSIS — H60391 Other infective otitis externa, right ear: Secondary | ICD-10-CM | POA: Diagnosis not present

## 2020-06-27 DIAGNOSIS — J069 Acute upper respiratory infection, unspecified: Secondary | ICD-10-CM | POA: Diagnosis not present

## 2020-06-27 DIAGNOSIS — H9201 Otalgia, right ear: Secondary | ICD-10-CM | POA: Diagnosis not present

## 2020-06-28 ENCOUNTER — Encounter: Payer: Self-pay | Admitting: Orthopedic Surgery

## 2020-06-28 ENCOUNTER — Ambulatory Visit (INDEPENDENT_AMBULATORY_CARE_PROVIDER_SITE_OTHER): Payer: BC Managed Care – PPO | Admitting: Physician Assistant

## 2020-06-28 DIAGNOSIS — M25512 Pain in left shoulder: Secondary | ICD-10-CM

## 2020-06-28 NOTE — Progress Notes (Signed)
Office Visit Note   Patient: Danielle Bond           Date of Birth: 10/22/1960           MRN: [id number] Visit Date: 06/28/2020              Requested by: [name], MD [address] [place],  [place] [address] PCP: [name], MD  Chief Complaint  Patient presents with  . Left Shoulder - Follow-up    S/p cortisone injection 06/02/20      HPI: This is a pleasant 59 year old woman who follows up today for her lower back pain and left shoulder pain.  Her back pain has significantly improved she has been using a TENS unit.  She still continues to have limitations with her left shoulder.  She especially has pain running down the front of the shoulder down her arm.  She did think the shoulder helped some of it but she feels limited in motion because of the pain  Assessment & Plan: Visit Diagnoses:  1. Left shoulder pain, unspecified chronicity     Plan: Discussed physical therapy versus an MRI of her left shoulder she would like to do some physical therapy.  I think some of her symptoms are consistent with proximal biceps tendinitis.  Follow-Up Instructions: No follow-ups on file.   Ortho Exam  Patient is alert, oriented, no adenopathy, well-dressed, normal affect, normal respiratory effort. Left shoulder she does have limited internal rotation and abduction secondary to stiffness and pain.  Distally she has good strength.  Tender along the biceps no infective process noted  Imaging: No results found. No images are attached to the encounter.  Labs: Lab Results  Component Value Date   ESRSEDRATE 64 (H) 07/09/2019   ESRSEDRATE 50 (H) 06/26/2018   ESRSEDRATE 25 (H) 03/16/2014   CRP 85.3 (H) 06/26/2018   REPTSTATUS 03/12/2014 FINAL 03/10/2014   CULT  03/10/2014    Multiple bacterial morphotypes present, none predominant. Suggest appropriate recollection if clinically indicated. Performed at [facility] 03/22/2014     Lab Results    Component Value Date   ALBUMIN 3.8 05/29/2019   ALBUMIN 3.9 06/26/2018   ALBUMIN 4.1 05/23/2018    Lab Results  Component Value Date   MG 1.6 08/15/2012   No results found for: VD25OH  No results found for: PREALBUMIN CBC EXTENDED Latest Ref Rng & Units 05/29/2019 04/16/2019 03/03/2019  WBC 4.0 - 10.5 K/uL 6.7 5.6 6.0  RBC 3.87 - 5.11 Mil/uL 4.45 4.43 4.67  HGB 12.0 - 15.0 g/dL 13.0 12.9 13.2  HCT 36 - 46 % 39.0 38.9 41.9  PLT 150 - 400 K/uL 329.0 293.0 281  NEUTROABS 1.4 - 7.7 K/uL 4.6 3.6 -  LYMPHSABS 0.7 - 4.0 K/uL 1.6 1.6 -     There is no height or weight on file to calculate BMI.  Orders:  Orders Placed This Encounter  Procedures  . Ambulatory referral to Physical Therapy   No orders of the defined types were placed in this encounter.    Procedures: No procedures performed  Clinical Data: No additional findings.  ROS:  All other systems negative, except as noted in the HPI. Review of Systems  Objective: Vital Signs: LMP 03/03/2013   Specialty Comments:  No specialty comments available.  PMFS History: Patient Active Problem List   Diagnosis Date Noted  . Elevated blood pressure reading without diagnosis of hypertension 01/20/2020  . Atherosclerosis of  native coronary artery of native heart without angina pectoris 06/08/2019  . Aortic atherosclerosis ([place]) 06/08/2019  . Mixed hyperlipidemia 05/02/2019  . Postural dizziness with presyncope 05/02/2019  . HSV-2 (herpes simplex virus 2) infection 11/04/2018  . Dyslipidemia 08/15/2018  . Hypothyroidism s/p total thyroidectomy 1997 08/15/2018  . Autoimmune hepatitis ([place]) 08/16/2012  . Asthma 03/31/2012  . Ulcerative colitis ([place]) 03/31/2012  . History of renal calculi 03/31/2012  . Migraines 03/31/2012  . Mitral valve prolapse    Past Medical History:  Diagnosis Date  . Asthma   . Chronic kidney disease   . Hx of pancreatitis 06/16/2018  . Kidney stone   . Kidney stone   . Migraine   .  Orthostatic hypotension   . Osteopenia 03/31/2012  . Ulcerative colitis ([place]) 03/31/2012    Family History  Problem Relation Age of Onset  . Cervical cancer Paternal Aunt   . Heart disease Paternal Grandmother   . Heart disease Mother   . Dementia Mother   . Thyroid disease Mother   . Osteoarthritis Mother   . Asthma Mother   . Heart attack Father   . Breast cancer Cousin   . Heart attack Brother   . Heart disease Brother     Past Surgical History:  Procedure Laterality Date  . CESAREAN SECTION     x3  . CHOLECYSTECTOMY    . COLONOSCOPY    . LITHOTRIPSY    . THYROIDECTOMY     1997   Social History   Occupational History  . Occupation: Self-employed  Tobacco Use  . Smoking status: Never Smoker  . Smokeless tobacco: Never Used  Vaping Use  . Vaping Use: Never used  Substance and Sexual Activity  . Alcohol use: No  . Drug use: No  . Sexual activity: Yes

## 2020-07-11 ENCOUNTER — Other Ambulatory Visit: Payer: Self-pay

## 2020-07-11 ENCOUNTER — Encounter: Payer: Self-pay | Admitting: Family Medicine

## 2020-07-11 ENCOUNTER — Ambulatory Visit (INDEPENDENT_AMBULATORY_CARE_PROVIDER_SITE_OTHER): Payer: BC Managed Care – PPO | Admitting: Family Medicine

## 2020-07-11 VITALS — BP 134/79 | HR 84 | Temp 97.6°F | Ht 60.0 in | Wt 152.2 lb

## 2020-07-11 DIAGNOSIS — E039 Hypothyroidism, unspecified: Secondary | ICD-10-CM

## 2020-07-11 DIAGNOSIS — K754 Autoimmune hepatitis: Secondary | ICD-10-CM

## 2020-07-11 DIAGNOSIS — K51011 Ulcerative (chronic) pancolitis with rectal bleeding: Secondary | ICD-10-CM

## 2020-07-11 DIAGNOSIS — E782 Mixed hyperlipidemia: Secondary | ICD-10-CM

## 2020-07-11 DIAGNOSIS — J4540 Moderate persistent asthma, uncomplicated: Secondary | ICD-10-CM

## 2020-07-11 DIAGNOSIS — Z0001 Encounter for general adult medical examination with abnormal findings: Secondary | ICD-10-CM

## 2020-07-11 DIAGNOSIS — M810 Age-related osteoporosis without current pathological fracture: Secondary | ICD-10-CM

## 2020-07-11 DIAGNOSIS — E785 Hyperlipidemia, unspecified: Secondary | ICD-10-CM

## 2020-07-11 DIAGNOSIS — E663 Overweight: Secondary | ICD-10-CM

## 2020-07-11 DIAGNOSIS — Z6829 Body mass index (BMI) 29.0-29.9, adult: Secondary | ICD-10-CM

## 2020-07-11 NOTE — Assessment & Plan Note (Signed)
Recent lipid panel within goal.  Continue Crestor 10 mg daily.

## 2020-07-11 NOTE — Assessment & Plan Note (Signed)
Follows with GI.  Will check CBC and CMET.

## 2020-07-11 NOTE — Assessment & Plan Note (Signed)
Stable.  On Synthroid per endocrinology.

## 2020-07-11 NOTE — Patient Instructions (Signed)
It was very nice to see you today!  Keep up the good work!  You are doing a great job.  We will check blood work today.   I will see you back in a year for your next physical or sooner if needed.   Take care, Dr [name]  Please try these tips to maintain a healthy lifestyle:   Eat at least 3 REAL meals and 1-2 snacks per day.  Aim for no more than 5 hours between eating.  If you eat breakfast, please do so within one hour of getting up.    Each meal should contain half fruits/vegetables, one quarter protein, and one quarter carbs (no bigger than a computer mouse)   Cut down on sweet beverages. This includes juice, soda, and sweet tea.     Drink at least 1 glass of water with each meal and aim for at least 8 glasses per day   Exercise at least 150 minutes every week.    Preventive Care 61-62 Years Old, Female Preventive care refers to visits with your health care provider and lifestyle choices that can promote health and wellness. This includes:  A yearly physical exam. This may also be called an annual well check.  Regular dental visits and eye exams.  Immunizations.  Screening for certain conditions.  Healthy lifestyle choices, such as eating a healthy diet, getting regular exercise, not using drugs or products that contain nicotine and tobacco, and limiting alcohol use. What can I expect for my preventive care visit? Physical exam Your health care provider will check your:  Height and weight. This may be used to calculate body mass index (BMI), which tells if you are at a healthy weight.  Heart rate and blood pressure.  Skin for abnormal spots. Counseling Your health care provider may ask you questions about your:  Alcohol, tobacco, and drug use.  Emotional well-being.  Home and relationship well-being.  Sexual activity.  Eating habits.  Work and work Statistician.  Method of birth control.  Menstrual cycle.  Pregnancy history. What  immunizations do I need?  Influenza (flu) vaccine  This is recommended every year. Tetanus, diphtheria, and pertussis (Tdap) vaccine  You may need a Td booster every 10 years. Varicella (chickenpox) vaccine  You may need this if you have not been vaccinated. Zoster (shingles) vaccine  You may need this after age 2. Measles, mumps, and rubella (MMR) vaccine  You may need at least one dose of MMR if you were born in 1957 or later. You may also need a second dose. Pneumococcal conjugate (PCV13) vaccine  You may need this if you have certain conditions and were not previously vaccinated. Pneumococcal polysaccharide (PPSV23) vaccine  You may need one or two doses if you smoke cigarettes or if you have certain conditions. Meningococcal conjugate (MenACWY) vaccine  You may need this if you have certain conditions. Hepatitis A vaccine  You may need this if you have certain conditions or if you travel or work in places where you may be exposed to hepatitis A. Hepatitis B vaccine  You may need this if you have certain conditions or if you travel or work in places where you may be exposed to hepatitis B. Haemophilus influenzae type b (Hib) vaccine  You may need this if you have certain conditions. Human papillomavirus (HPV) vaccine  If recommended by your health care provider, you may need three doses over 6 months. You may receive vaccines as individual doses or as more than one  vaccine together in one shot (combination vaccines). Talk with your health care provider about the risks and benefits of combination vaccines. What tests do I need? Blood tests  Lipid and cholesterol levels. These may be checked every 5 years, or more frequently if you are over 26 years old.  Hepatitis C test.  Hepatitis B test. Screening  Lung cancer screening. You may have this screening every year starting at age 74 if you have a 30-pack-year history of smoking and currently smoke or have quit  within the past 15 years.  Colorectal cancer screening. All adults should have this screening starting at age 23 and continuing until age 4. Your health care provider may recommend screening at age 57 if you are at increased risk. You will have tests every 1-10 years, depending on your results and the type of screening test.  Diabetes screening. This is done by checking your blood sugar (glucose) after you have not eaten for a while (fasting). You may have this done every 1-3 years.  Mammogram. This may be done every 1-2 years. Talk with your health care provider about when you should start having regular mammograms. This may depend on whether you have a family history of breast cancer.  BRCA-related cancer screening. This may be done if you have a family history of breast, ovarian, tubal, or peritoneal cancers.  Pelvic exam and Pap test. This may be done every 3 years starting at age 35. Starting at age 63, this may be done every 5 years if you have a Pap test in combination with an HPV test. Other tests  Sexually transmitted disease (STD) testing.  Bone density scan. This is done to screen for osteoporosis. You may have this scan if you are at high risk for osteoporosis. Follow these instructions at home: Eating and drinking  Eat a diet that includes fresh fruits and vegetables, whole grains, lean protein, and low-fat dairy.  Take vitamin and mineral supplements as recommended by your health care provider.  Do not drink alcohol if: ? Your health care provider tells you not to drink. ? You are pregnant, may be pregnant, or are planning to become pregnant.  If you drink alcohol: ? Limit how much you have to 0-1 drink a day. ? Be aware of how much alcohol is in your drink. In the [place] one drink equals one 12 oz bottle of beer (355 mL), one 5 oz glass of wine (148 mL), or one 1 oz glass of hard liquor (44 mL). Lifestyle  Take daily care of your teeth and gums.  Stay active.  Exercise for at least 30 minutes on 5 or more days each week.  Do not use any products that contain nicotine or tobacco, such as cigarettes, e-cigarettes, and chewing tobacco. If you need help quitting, ask your health care provider.  If you are sexually active, practice safe sex. Use a condom or other form of birth control (contraception) in order to prevent pregnancy and STIs (sexually transmitted infections).  If told by your health care provider, take low-dose aspirin daily starting at age 78. What's next?  Visit your health care provider once a year for a well check visit.  Ask your health care provider how often you should have your eyes and teeth checked.  Stay up to date on all vaccines. This information is not intended to replace advice given to you by your health care provider. Make sure you discuss any questions you have with your health care provider. Document  Revised: 03/20/2018 Document Reviewed: 03/20/2018 Elsevier Patient Education  [place].

## 2020-07-11 NOTE — Assessment & Plan Note (Signed)
Follows with GI.  Check CBC and CMET.  Doing well on Humira.

## 2020-07-11 NOTE — Assessment & Plan Note (Signed)
Follows with pulmonology.  Has had slightly more cough recently but symptoms are otherwise stable.

## 2020-07-11 NOTE — Progress Notes (Signed)
Chief Complaint:  Danielle Bond is a 59 y.o. female who presents today for her annual comprehensive physical exam.    Assessment/Plan:  New/Acute Problems: Cough No red flags.  Normal pulmonary exam today.  Likely postnasal drip/upper respiratory.  She will continue current treatment regimen Flonase.  Offered prescription for Astelin however she declined.  If not improving the next few weeks will consider chest x-ray.  Chronic Problems Addressed Today: Autoimmune hepatitis Follows with GI.  Will check CBC and CMET.  Ulcerative colitis Follows with GI.  Check CBC and CMET.  Doing well on Humira.  Asthma Follows with pulmonology.  Has had slightly more cough recently but symptoms are otherwise stable.  Osteoporosis Was found on DEXA can by OB/GYN.  She did not tolerate Boniva.  She will be following up with endocrinology soon.  Mixed hyperlipidemia Recent lipid panel within goal.  Continue Crestor 10 mg daily.  Hypothyroidism s/p total thyroidectomy 1997 Stable.  On Synthroid per endocrinology.   Body mass index is 29.72 kg/m. / Overweight  BMI Metric Follow Up - 07/11/20 1143      BMI Metric Follow Up-Please document annually   BMI Metric Follow Up Education provided            Preventative Healthcare: Up-to-date on vaccines and screenings.  Will need to obtain mammogram records from OB/GYN.  Patient Counseling(The following topics were reviewed and/or handout was given):  -Nutrition: Stressed importance of moderation in sodium/caffeine intake, saturated fat and cholesterol, caloric balance, sufficient intake of fresh fruits, vegetables, and fiber.  -Stressed the importance of regular exercise.   -Substance Abuse: Discussed cessation/primary prevention of tobacco, alcohol, or other drug use; driving or other dangerous activities under the influence; availability of treatment for abuse.   -Injury prevention: Discussed safety belts, safety helmets, smoke detector,  smoking near bedding or upholstery.   -Sexuality: Discussed sexually transmitted diseases, partner selection, use of condoms, avoidance of unintended pregnancy and contraceptive alternatives.   -Dental health: Discussed importance of regular tooth brushing, flossing, and dental visits.  -Health maintenance and immunizations reviewed. Please refer to Health maintenance section.  Return to care in 1 year for next preventative visit.     Subjective:  HPI:  She has no acute complaints today.  She has had persistent cough for the last couple of weeks.  Has follow-up with ENT and had some debris removed from her right ear.  Was having more drainage and congestion.  No reported fevers or chills.  Lifestyle Diet: Balanced. Plenty of fruits and vegetables.  Exercise: Power walking 4 days per week.   Depression screen PHQ 2/9 07/11/2020  Decreased Interest 0  Down, Depressed, Hopeless 0  PHQ - 2 Score 0  Altered sleeping -  Tired, decreased energy -  Change in appetite -  Feeling bad or failure about yourself  -  Trouble concentrating -  Moving slowly or fidgety/restless -  Suicidal thoughts -  PHQ-9 Score -    Health Maintenance Due  Topic Date Due  . MAMMOGRAM  05/05/2020     ROS: Per HPI, otherwise a complete review of systems was negative.   PMH:  The following were reviewed and entered/updated in epic: Past Medical History:  Diagnosis Date  . Asthma   . Chronic kidney disease   . Hx of pancreatitis 06/16/2018  . Kidney stone   . Kidney stone   . Migraine   . Orthostatic hypotension   . Osteopenia 03/31/2012  . Ulcerative colitis ([place]) 03/31/2012  Patient Active Problem List   Diagnosis Date Noted  . Osteoporosis 07/11/2020  . Atherosclerosis of native coronary artery of native heart without angina pectoris 06/08/2019  . Aortic atherosclerosis ([place]) 06/08/2019  . Mixed hyperlipidemia 05/02/2019  . Postural dizziness with presyncope 05/02/2019  . HSV-2 (herpes  simplex virus 2) infection 11/04/2018  . Hypothyroidism s/p total thyroidectomy 1997 08/15/2018  . Autoimmune hepatitis ([place]) 08/16/2012  . Asthma 03/31/2012  . Ulcerative colitis ([place]) 03/31/2012  . History of renal calculi 03/31/2012  . Migraines 03/31/2012  . Mitral valve prolapse    Past Surgical History:  Procedure Laterality Date  . CESAREAN SECTION     x3  . CHOLECYSTECTOMY    . COLONOSCOPY    . LITHOTRIPSY    . THYROIDECTOMY     1997    Family History  Problem Relation Age of Onset  . Cervical cancer Paternal Aunt   . Heart disease Paternal Grandmother   . Heart disease Mother   . Dementia Mother   . Thyroid disease Mother   . Osteoarthritis Mother   . Asthma Mother   . Heart attack Father   . Breast cancer Cousin   . Heart attack Brother   . Heart disease Brother     Medications- reviewed and updated Current Outpatient Medications  Medication Sig Dispense Refill  . albuterol (VENTOLIN HFA) 108 (90 Base) MCG/ACT inhaler TAKE 2 PUFFS BY MOUTH EVERY 6 HOURS AS NEEDED FOR WHEEZE OR SHORTNESS OF BREATH 25.5 each 1  . budesonide-formoterol (SYMBICORT) 160-4.5 MCG/ACT inhaler Inhale 2 puffs into the lungs in the morning and at bedtime. 1 each 12  . ELDERBERRY PO Take 1 tablet by mouth daily.    Marland Kitchen EPINEPHrine 0.3 mg/0.3 mL IJ SOAJ injection Inject 0.3 mLs (0.3 mg total) into the muscle as needed for anaphylaxis. 1 each 1  . fluticasone (FLONASE) 50 MCG/ACT nasal spray PLACE 2 SPRAYS INTO BOTH NOSTRILS DAILY AS NEEDED FOR ALLERGIES OR RHINITIS. 48 mL 1  . folic acid (FOLVITE) 1 MG tablet Take 1 mg by mouth daily.    Marland Kitchen HUMIRA PEN 40 MG/0.4ML PNKT every 14 (fourteen) days.     Marland Kitchen levothyroxine (SYNTHROID, LEVOTHROID) 112 MCG tablet Take 112 mcg by mouth daily before breakfast.    . Multiple Vitamin (MULTIVITAMIN WITH MINERALS) TABS tablet Take 1 tablet by mouth daily.    . Multiple Vitamins-Minerals (GNP HAIR/SKIN/NAILS PO) Take by mouth.    . pantoprazole (PROTONIX) 40  MG tablet Take 40 mg by mouth daily.    . Riboflavin 100 MG CAPS Take by mouth.    . rosuvastatin (CRESTOR) 10 MG tablet Take 1 tablet (10 mg total) by mouth daily. 90 tablet 1  . vitamin C (ASCORBIC ACID) 500 MG tablet Take 500 mg by mouth daily.     No current facility-administered medications for this visit.    Allergies-reviewed and updated Allergies  Allergen Reactions  . Almond Oil Hives and Shortness Of Breath  . Lialda [Mesalamine] Other (See Comments)    Vomiting and rectal bleeding  . Other Anaphylaxis    Almonds, tree nuts  . Aspirin Hives and Nausea Only    Stomach cramps  . Hydromorphone Hcl Other (See Comments)    Caused Enzyme levels to go up  . Penicillins Hives and Swelling    Has patient had a PCN reaction causing immediate rash, facial/tongue/throat swelling, SOB or lightheadedness with hypotension: Yes Has patient had a PCN reaction causing severe rash involving mucus membranes or skin  necrosis: No Has patient had a PCN reaction that required hospitalization: No Has patient had a PCN reaction occurring within the last 10 years: No  Stomach cramps If all of the above answers are "NO", then may proceed with Cephalosporin use.   . Acetaminophen Other (See Comments)    Has autoimmune hepatitis, wants to avoid APAP  . Apriso [Mesalamine Er] Nausea And Vomiting  . Corticosteroids Other (See Comments)    Had acute pancreatitis x 2 after steroid use.  . Imitrex [Sumatriptan] Swelling    Injection caused swelling  . Stadol [Butorphanol] Other (See Comments)    hallucinates  . Topamax [Topiramate] Other (See Comments)    Kidney stones  . Vancomycin Hives  . Azathioprine Rash  . Pepcid [Famotidine] Nausea And Vomiting    Makes acid worse  . Prednisone Nausea And Vomiting    All steroids causes nausea and pancreatitis     Social History   Socioeconomic History  . Marital status: Married    Spouse name: Not on file  . Number of children: 2  . Years of  education: some college  . Highest education level: Not on file  Occupational History  . Occupation: Self-employed  Tobacco Use  . Smoking status: Never Smoker  . Smokeless tobacco: Never Used  Vaping Use  . Vaping Use: Never used  Substance and Sexual Activity  . Alcohol use: No  . Drug use: No  . Sexual activity: Yes  Other Topics Concern  . Not on file  Social History Narrative   Lives at home with her husband.   Right-handed.   No caffeine use.   Social Determinants of Health   Financial Resource Strain: Not on file  Food Insecurity: Not on file  Transportation Needs: Not on file  Physical Activity: Not on file  Stress: Not on file  Social Connections: Not on file        Objective:  Physical Exam: BP 134/79   Pulse 84   Temp 97.6 F (36.4 C) (Temporal)   Ht 5' (1.524 m)   Wt 152 lb 3.2 oz (69 kg)   LMP 03/03/2013   SpO2 98%   BMI 29.72 kg/m   Body mass index is 29.72 kg/m. Wt Readings from Last 3 Encounters:  07/11/20 152 lb 3.2 oz (69 kg)  06/02/20 151 lb (68.5 kg)  05/24/20 151 lb 6.4 oz (68.7 kg)   Gen: NAD, resting comfortably HEENT: Right EAC with erythema.  No effusion. TMs clear bilaterally. Pulm: NWOB, CTAB with no crackles, wheezes, or rhonchi GI: Normal bowel sounds present. Soft, Nontender, Nondistended. MSK: no edema, cyanosis, or clubbing noted Skin: warm, dry Neuro: CN2-12 grossly intact. Strength 5/5 in upper and lower extremities. Reflexes symmetric and intact bilaterally.  Psych: Normal affect and thought content     Nox Talent M. [name], MD 07/11/2020 11:43 AM

## 2020-07-11 NOTE — Assessment & Plan Note (Signed)
Was found on DEXA can by OB/GYN.  She did not tolerate Boniva.  She will be following up with endocrinology soon.

## 2020-07-12 ENCOUNTER — Other Ambulatory Visit: Payer: Self-pay | Admitting: *Deleted

## 2020-07-12 DIAGNOSIS — K5190 Ulcerative colitis, unspecified, without complications: Secondary | ICD-10-CM | POA: Diagnosis not present

## 2020-07-12 LAB — COMPREHENSIVE METABOLIC PANEL
AG Ratio: 1.3 (calc) (ref 1.0–2.5)
ALT: 20 U/L (ref 6–29)
AST: 31 U/L (ref 10–35)
Albumin: 4.5 g/dL (ref 3.6–5.1)
Alkaline phosphatase (APISO): 59 U/L (ref 37–153)
BUN: 13 mg/dL (ref 7–25)
CO2: 27 mmol/L (ref 20–32)
Calcium: 9.7 mg/dL (ref 8.6–10.4)
Chloride: 105 mmol/L (ref 98–110)
Creat: 0.87 mg/dL (ref 0.50–1.05)
Globulin: 3.5 g/dL (calc) (ref 1.9–3.7)
Glucose, Bld: 111 mg/dL — ABNORMAL HIGH (ref 65–99)
Potassium: 4.6 mmol/L (ref 3.5–5.3)
Sodium: 139 mmol/L (ref 135–146)
Total Bilirubin: 0.5 mg/dL (ref 0.2–1.2)
Total Protein: 8 g/dL (ref 6.1–8.1)

## 2020-07-12 LAB — CBC
HCT: 38.1 % (ref 35.0–45.0)
Hemoglobin: 12.4 g/dL (ref 11.7–15.5)
MCH: 29.1 pg (ref 27.0–33.0)
MCHC: 32.5 g/dL (ref 32.0–36.0)
MCV: 89.4 fL (ref 80.0–100.0)
MPV: 9.9 fL (ref 7.5–12.5)
Platelets: 303 10*3/uL (ref 140–400)
RBC: 4.26 10*6/uL (ref 3.80–5.10)
RDW: 12 % (ref 11.0–15.0)
WBC: 6.7 10*3/uL (ref 3.8–10.8)

## 2020-07-12 MED ORDER — FLUTICASONE PROPIONATE 50 MCG/ACT NA SUSP: 2.0000 | Freq: Every day | NASAL | 1 refills | Status: DC | PRN

## 2020-07-12 NOTE — Progress Notes (Signed)
Please inform patient of the following:  Blood sugar borderline but stable. Everything else is NORMAL. Would like for her to keep up the good work and we can recheck in a year or so.  [name]. [name], MD 07/12/2020 8:06 AM

## 2020-07-13 DIAGNOSIS — F4310 Post-traumatic stress disorder, unspecified: Secondary | ICD-10-CM | POA: Diagnosis not present

## 2020-07-19 ENCOUNTER — Ambulatory Visit: Payer: BC Managed Care – PPO | Admitting: Allergy & Immunology

## 2020-07-21 ENCOUNTER — Ambulatory Visit (INDEPENDENT_AMBULATORY_CARE_PROVIDER_SITE_OTHER): Payer: BC Managed Care – PPO | Admitting: Physical Therapy

## 2020-07-21 ENCOUNTER — Other Ambulatory Visit: Payer: Self-pay | Admitting: Cardiology

## 2020-07-21 ENCOUNTER — Encounter: Payer: Self-pay | Admitting: Physical Therapy

## 2020-07-21 ENCOUNTER — Other Ambulatory Visit: Payer: Self-pay

## 2020-07-21 DIAGNOSIS — M25512 Pain in left shoulder: Secondary | ICD-10-CM

## 2020-07-21 NOTE — Patient Instructions (Signed)
Access Code: [email or web address] URL: [email or web address] Date: 07/21/2020 Prepared by: Lyndee Hensen  Exercises Supine Shoulder Flexion with Dowel - 2 x daily - 1 sets - 10 reps - 5 hold Supine Shoulder External Rotation with Dowel - 2 x daily - 1-2 sets - 10 reps - 5 hold Seated Scapular Retraction - 2 x daily - 1 sets - 10 reps Standing Shoulder Internal Rotation Stretch with Hands Behind Back - 2 x daily - 1 sets - 5 reps - 5 hold

## 2020-07-25 ENCOUNTER — Encounter: Payer: BC Managed Care – PPO | Admitting: Physical Therapy

## 2020-07-25 ENCOUNTER — Encounter: Payer: Self-pay | Admitting: Physical Therapy

## 2020-07-25 NOTE — Therapy (Signed)
[facility] [place] [address] [place], [place], [address] Phone: [phone]   Fax:  [phone]  Physical Therapy Evaluation  Patient Details  Name: Danielle Bond MRN: [id number] Date of Birth: 05/03/1961 Referring Provider (PT): Bevely Palmer Persons, [place]   Encounter Date: 07/21/2020   PT End of Session - 07/25/20 1028    Visit Number 1    Number of Visits 12    Date for PT Re-Evaluation 09/01/20    Authorization Type BCBS    PT Start Time 0848    PT Stop Time 0930    PT Time Calculation (min) 42 min    Activity Tolerance Patient tolerated treatment well    Behavior During Therapy [facility] for tasks assessed/performed           Past Medical History:  Diagnosis Date  . Asthma   . Chronic kidney disease   . Hx of pancreatitis 06/16/2018  . Kidney stone   . Kidney stone   . Migraine   . Orthostatic hypotension   . Osteopenia 03/31/2012  . Ulcerative colitis ([place]) 03/31/2012    Past Surgical History:  Procedure Laterality Date  . CESAREAN SECTION     x3  . CHOLECYSTECTOMY    . COLONOSCOPY    . LITHOTRIPSY    . THYROIDECTOMY     1997    There were no vitals filed for this visit.    Subjective Assessment - 07/25/20 1027    Subjective Pt R handed. Has had R shoulder pain in the past. Pain now in L shoulder. Did have recent injection on L, did not help much. Pt self employed, work duties do not bother shoulder much. Increased pain with reaching, ADLS    Limitations Lifting;Writing;House hold activities    Patient Stated Goals Decreased pain    Currently in Pain? Yes    Pain Score 7     Pain Location Shoulder    Pain Orientation Left    Pain Descriptors / Indicators Aching    Pain Type Acute pain    Pain Onset More than a month ago    Pain Frequency Intermittent    Aggravating Factors  sleeping, reaching, ADLs.              [facility] PT Assessment - 07/25/20 0001      Assessment   Medical Diagnosis L shoulder pain     Referring Provider (PT) Bevely Palmer Persons, PA    Hand Dominance Right    Prior Therapy no      Balance Screen   Has the patient fallen in the past 6 months No      Prior Function   Level of Independence Independent      Cognition   Overall Cognitive Status Within Functional Limits for tasks assessed      AROM   Overall AROM Comments Behind back IR: to belt line/pain    Right Shoulder Flexion 100 Degrees    Right Shoulder ABduction 60 Degrees    Right Shoulder Internal Rotation 50 Degrees    Right Shoulder External Rotation 55 Degrees      PROM   Left Shoulder Flexion 120 Degrees    Left Shoulder ABduction 120 Degrees    Left Shoulder Internal Rotation 50 Degrees    Left Shoulder External Rotation 55 Degrees      Strength   Left Shoulder Flexion 3-/5    Left Shoulder ABduction 3-/5    Left Shoulder Internal Rotation 4-/5    Left  Shoulder External Rotation 4/5      Palpation   Palpation comment Soreness in anterior/lateral shoulder radiating into bicep groove.                      Objective measurements completed on examination: See above findings.       [facility] Adult PT Treatment/Exercise - 07/25/20 0001      Exercises   Exercises Shoulder      Shoulder Exercises: Supine   External Rotation AAROM;15 reps    External Rotation Limitations cane    Flexion AAROM;15 reps    Flexion Limitations cane      Shoulder Exercises: Seated   Retraction 15 reps      Shoulder Exercises: Stretch   Other Shoulder Stretches IR behind back, 2 hand hold , 5 sec x 5                  PT Education - 07/25/20 1036    Education Details PT POC, Exam findings, HEP    Person(s) Educated Patient    Methods Explanation;Demonstration;Tactile cues;Verbal cues;Handout    Comprehension Verbalized understanding;Returned demonstration;Tactile cues required;Verbal cues required;Need further instruction            PT Short Term Goals - 07/25/20 1029      PT SHORT  TERM GOAL #1   Title Pt to be independent wtih initial HEP    Time 2    Period Weeks    Status New    Target Date 08/04/20      PT SHORT TERM GOAL #2   Title Pt to demo improved PROM for flexion by at least 10 deg.    Time 2    Period Weeks    Status New    Target Date 08/04/20             PT Long Term Goals - 07/25/20 1030      PT LONG TERM GOAL #1   Title Pt to be independent with final HEP    Time 6    Period Weeks    Status New    Target Date 09/01/20      PT LONG TERM GOAL #2   Title Pt to demo improved AROM of L shoulder to be WNL to improve ability for ADLs and IADLS.    Time 6    Period Weeks    Status New    Target Date 09/01/20      PT LONG TERM GOAL #3   Title Pt to demo improved strength of L shouder, to be at least 4+/5 to improve ability for reaching, lifting, carrying.    Time 6    Period Weeks    Status New    Target Date 09/01/20      PT LONG TERM GOAL #4   Title Pt to report decreased pain in L shoulder to 0-1/10 with activity.    Time 6    Period Weeks    Status New    Target Date 09/01/20                  Plan - 07/25/20 1036    Clinical Impression Statement Pt presents wtih primary complaint of increased pain in L shoulder. She has GHJ stiffness, with lack of PROM and AROM. Pt with decreased strength, and decreased ability for full functional activities, elevation, reaching, lifting, and IADLs. Pt to benefit from skilled PT to improve deficits and pain.    Examination-Activity Limitations  Carry;Lift;Reach Overhead;Dressing    Examination-Participation Restrictions Cleaning;Community Activity;Laundry;Shop    Stability/Clinical Decision Making Stable/Uncomplicated    Clinical Decision Making Low    Rehab Potential Good    PT Frequency 2x / week    PT Duration 6 weeks    PT Treatment/Interventions ADLs/Self Care Home Management;Cryotherapy;Electrical Stimulation;Iontophoresis 41m/ml Dexamethasone;Moist Heat;Therapeutic  exercise;Therapeutic activities;Functional mobility training;DME Instruction;Ultrasound;Neuromuscular re-education;Patient/family education;Manual techniques;Vasopneumatic Device;Taping;Energy conservation;Dry needling;Passive range of motion;Spinal Manipulations;Joint Manipulations    PT Home Exercise Plan [email or web address]   Consulted and Agree with Plan of Care Patient           Patient will benefit from skilled therapeutic intervention in order to improve the following deficits and impairments:  Hypomobility,Decreased activity tolerance,Decreased strength,Pain,Impaired UE functional use,Increased muscle spasms,Decreased mobility,Decreased range of motion,Impaired flexibility  Visit Diagnosis: Acute pain of left shoulder     Problem List Patient Active Problem List   Diagnosis Date Noted  . Osteoporosis 07/11/2020  . Atherosclerosis of native coronary artery of native heart without angina pectoris 06/08/2019  . Aortic atherosclerosis ([place] 06/08/2019  . Mixed hyperlipidemia 05/02/2019  . Postural dizziness with presyncope 05/02/2019  . HSV-2 (herpes simplex virus 2) infection 11/04/2018  . Hypothyroidism s/p total thyroidectomy 1997 08/15/2018  . Autoimmune hepatitis ([place] 08/16/2012  . Asthma 03/31/2012  . [place]cerative colitis ([place] 03/31/2012  . History of renal calculi 03/31/2012  . Migraines 03/31/2012  . Mitral valve prolapse     LLyndee Hensen PT, DPT 10:40 AM  07/25/20    [facility] [address]Phone: [phone]  Fax:  [phone] Name: Danielle WhitenightMRN: [id number] of Birth: 401-10-1960

## 2020-07-27 ENCOUNTER — Encounter: Payer: Self-pay | Admitting: Physical Therapy

## 2020-07-27 ENCOUNTER — Ambulatory Visit (INDEPENDENT_AMBULATORY_CARE_PROVIDER_SITE_OTHER): Payer: BC Managed Care – PPO | Admitting: Physical Therapy

## 2020-07-27 ENCOUNTER — Other Ambulatory Visit: Payer: Self-pay

## 2020-07-27 DIAGNOSIS — M25512 Pain in left shoulder: Secondary | ICD-10-CM | POA: Diagnosis not present

## 2020-07-27 NOTE — Therapy (Signed)
[place] [address] [place], [place], [address] Phone: [phone]   Fax:  [phone]  Physical Therapy Treatment  Patient Details  Name: Danielle Bond MRN: [id number] Date of Birth: May 17, 1961 Referring Provider (PT): Bevely Palmer Persons, [place]   Encounter Date: 07/27/2020   PT End of Session - 07/27/20 0843    Visit Number 2    Number of Visits 12    Date for PT Re-Evaluation 09/01/20    Authorization Type BCBS    PT Start Time 0845    PT Stop Time 0925    PT Time Calculation (min) 40 min    Activity Tolerance Patient tolerated treatment well    Behavior During Therapy [facility] for tasks assessed/performed           Past Medical History:  Diagnosis Date  . Asthma   . Chronic kidney disease   . Hx of pancreatitis 06/16/2018  . Kidney stone   . Kidney stone   . Migraine   . Orthostatic hypotension   . Osteopenia 03/31/2012  . Ulcerative colitis ([place]) 03/31/2012    Past Surgical History:  Procedure Laterality Date  . CESAREAN SECTION     x3  . CHOLECYSTECTOMY    . COLONOSCOPY    . LITHOTRIPSY    . THYROIDECTOMY     1997    There were no vitals filed for this visit.   Subjective Assessment - 07/27/20 0843    Subjective Pt has been going HEP    Limitations Lifting;Writing;House hold activities    Patient Stated Goals Decreased pain    Currently in Pain? Yes    Pain Score 7     Pain Location Shoulder    Pain Orientation Left    Pain Descriptors / Indicators Aching    Pain Type Acute pain    Pain Onset More than a month ago    Pain Frequency Intermittent                             [facility] Adult PT Treatment/Exercise - 07/27/20 0001      Exercises   Exercises Shoulder      Shoulder Exercises: Supine   External Rotation AAROM;15 reps    External Rotation Limitations cane    Flexion AAROM;15 reps    Flexion Limitations cane      Shoulder Exercises: Seated   Retraction --      Shoulder  Exercises: Standing   Row 20 reps    Theraband Level (Shoulder Row) Level 2 (Red)      Shoulder Exercises: Pulleys   Flexion 2 minutes      Shoulder Exercises: ROM/Strengthening   Wall Wash x15 , 1 UE;    Other ROM/Strengthening Exercises shoulder IR behind back: Ext, side , and hands together, x 10 ea with stick;      Shoulder Exercises: Stretch   Corner Stretch 5 reps;20 seconds    Corner Stretch Limitations doorway, 45 deg;    Other Shoulder Stretches IR behind back, 2 hand hold , 5 sec x 5    Other Shoulder Stretches Supine ER butterfly 10 sec x 10;      Modalities   Modalities Iontophoresis      Iontophoresis   Type of Iontophoresis Dexamethasone    Location L shoulder/anterior    Time 4 hr patch      Manual Therapy   Manual Therapy Joint mobilization;Soft tissue mobilization;Passive ROM  Joint Mobilization post and inf GHJ mobs gr 3;    Passive ROM L shoulder, all motions , manual pec stretch                    PT Short Term Goals - 07/25/20 1029      PT SHORT TERM GOAL #1   Title Pt to be independent wtih initial HEP    Time 2    Period Weeks    Status New    Target Date 08/04/20      PT SHORT TERM GOAL #2   Title Pt to demo improved PROM for flexion by at least 10 deg.    Time 2    Period Weeks    Status New    Target Date 08/04/20             PT Long Term Goals - 07/25/20 1030      PT LONG TERM GOAL #1   Title Pt to be independent with final HEP    Time 6    Period Weeks    Status New    Target Date 09/01/20      PT LONG TERM GOAL #2   Title Pt to demo improved AROM of L shoulder to be WNL to improve ability for ADLs and IADLS.    Time 6    Period Weeks    Status New    Target Date 09/01/20      PT LONG TERM GOAL #3   Title Pt to demo improved strength of L shouder, to be at least 4+/5 to improve ability for reaching, lifting, carrying.    Time 6    Period Weeks    Status New    Target Date 09/01/20      PT LONG TERM  GOAL #4   Title Pt to report decreased pain in L shoulder to 0-1/10 with activity.    Time 6    Period Weeks    Status New    Target Date 09/01/20                 Plan - 07/27/20 1034    Clinical Impression Statement Pt with improvements of PROM for all motions today. increased soreness in L pec region, added stretching for this.  Reviewd and updated HEP. Plan to progress as tolerated.    Examination-Activity Limitations Carry;Lift;Reach Overhead;Dressing    Examination-Participation Restrictions Cleaning;Community Activity;Laundry;Shop    Stability/Clinical Decision Making Stable/Uncomplicated    Rehab Potential Good    PT Frequency 2x / week    PT Duration 6 weeks    PT Treatment/Interventions ADLs/Self Care Home Management;Cryotherapy;Electrical Stimulation;Iontophoresis 55m/ml Dexamethasone;Moist Heat;Therapeutic exercise;Therapeutic activities;Functional mobility training;DME Instruction;Ultrasound;Neuromuscular re-education;Patient/family education;Manual techniques;Vasopneumatic Device;Taping;Energy conservation;Dry needling;Passive range of motion;Spinal Manipulations;Joint Manipulations    PT Home Exercise Plan [email or web address]   Consulted and Agree with Plan of Care Patient           Patient will benefit from skilled therapeutic intervention in order to improve the following deficits and impairments:  Hypomobility,Decreased activity tolerance,Decreased strength,Pain,Impaired UE functional use,Increased muscle spasms,Decreased mobility,Decreased range of motion,Impaired flexibility  Visit Diagnosis: Acute pain of left shoulder     Problem List Patient Active Problem List   Diagnosis Date Noted  . Osteoporosis 07/11/2020  . Atherosclerosis of native coronary artery of native heart without angina pectoris 06/08/2019  . Aortic atherosclerosis ([place] 06/08/2019  . Mixed hyperlipidemia 05/02/2019  . Postural dizziness with presyncope 05/02/2019  . HSV-2 (herpes simplex  virus  2) infection 11/04/2018  . Hypothyroidism s/p total thyroidectomy 1997 08/15/2018  . Autoimmune hepatitis ([place]) 08/16/2012  . Asthma 03/31/2012  . Ulcerative colitis ([place]) 03/31/2012  . History of renal calculi 03/31/2012  . Migraines 03/31/2012  . Mitral valve prolapse     Lyndee Hensen, PT, DPT 10:36 AM  07/27/20    [facility] [place], [place], [address] Phone: [phone]   Fax:  [phone]  Name: Danielle Bond MRN: [id number] Date of Birth: 03-25-1961

## 2020-08-01 ENCOUNTER — Encounter: Payer: BC Managed Care – PPO | Admitting: Physical Therapy

## 2020-08-03 ENCOUNTER — Encounter: Payer: BC Managed Care – PPO | Admitting: Physical Therapy

## 2020-08-03 ENCOUNTER — Other Ambulatory Visit: Payer: Self-pay | Admitting: Family Medicine

## 2020-08-03 DIAGNOSIS — F4310 Post-traumatic stress disorder, unspecified: Secondary | ICD-10-CM | POA: Diagnosis not present

## 2020-08-04 DIAGNOSIS — K5190 Ulcerative colitis, unspecified, without complications: Secondary | ICD-10-CM | POA: Diagnosis not present

## 2020-08-08 ENCOUNTER — Encounter: Payer: BC Managed Care – PPO | Admitting: Physical Therapy

## 2020-08-10 ENCOUNTER — Encounter: Payer: BC Managed Care – PPO | Admitting: Physical Therapy

## 2020-08-15 ENCOUNTER — Encounter: Payer: BC Managed Care – PPO | Admitting: Physical Therapy

## 2020-08-16 DIAGNOSIS — E890 Postprocedural hypothyroidism: Secondary | ICD-10-CM | POA: Diagnosis not present

## 2020-08-16 DIAGNOSIS — M810 Age-related osteoporosis without current pathological fracture: Secondary | ICD-10-CM | POA: Diagnosis not present

## 2020-08-16 DIAGNOSIS — E119 Type 2 diabetes mellitus without complications: Secondary | ICD-10-CM | POA: Diagnosis not present

## 2020-08-16 DIAGNOSIS — E7800 Pure hypercholesterolemia, unspecified: Secondary | ICD-10-CM | POA: Diagnosis not present

## 2020-08-17 ENCOUNTER — Encounter: Payer: BC Managed Care – PPO | Admitting: Physical Therapy

## 2020-08-18 DIAGNOSIS — E785 Hyperlipidemia, unspecified: Secondary | ICD-10-CM | POA: Diagnosis not present

## 2020-08-18 DIAGNOSIS — E119 Type 2 diabetes mellitus without complications: Secondary | ICD-10-CM | POA: Diagnosis not present

## 2020-08-18 DIAGNOSIS — E890 Postprocedural hypothyroidism: Secondary | ICD-10-CM | POA: Diagnosis not present

## 2020-08-18 DIAGNOSIS — M810 Age-related osteoporosis without current pathological fracture: Secondary | ICD-10-CM | POA: Diagnosis not present

## 2020-08-24 DIAGNOSIS — F4310 Post-traumatic stress disorder, unspecified: Secondary | ICD-10-CM | POA: Diagnosis not present

## 2020-09-01 ENCOUNTER — Ambulatory Visit: Payer: BC Managed Care – PPO | Admitting: Physician Assistant

## 2020-09-06 ENCOUNTER — Ambulatory Visit: Payer: BC Managed Care – PPO | Admitting: Allergy & Immunology

## 2020-09-08 ENCOUNTER — Ambulatory Visit (INDEPENDENT_AMBULATORY_CARE_PROVIDER_SITE_OTHER): Payer: BC Managed Care – PPO | Admitting: Physical Therapy

## 2020-09-08 ENCOUNTER — Encounter: Payer: BC Managed Care – PPO | Admitting: Physical Therapy

## 2020-09-08 ENCOUNTER — Encounter: Payer: Self-pay | Admitting: Physical Therapy

## 2020-09-08 ENCOUNTER — Other Ambulatory Visit: Payer: Self-pay

## 2020-09-08 DIAGNOSIS — M25512 Pain in left shoulder: Secondary | ICD-10-CM | POA: Diagnosis not present

## 2020-09-08 NOTE — Therapy (Signed)
[facility] [address] [place], [place], [address] Phone: [phone]   Fax:  [phone]  Physical Therapy Treatment/ Re-Evaluation  Patient Details  Name: Danielle Bond MRN: [id number] Date of Birth: 1961-06-05 Referring Provider (PT): Bevely Palmer Persons, [place]   Encounter Date: 09/08/2020   PT End of Session - 09/08/20 1139    Visit Number 3    Number of Visits 12    Date for PT Re-Evaluation 10/08/20    Authorization Type BCBS    PT Start Time 1100    PT Stop Time 1145    PT Time Calculation (min) 45 min    Activity Tolerance Patient tolerated treatment well    Behavior During Therapy [facility] for tasks assessed/performed           Past Medical History:  Diagnosis Date  . Asthma   . Chronic kidney disease   . Hx of pancreatitis 06/16/2018  . Kidney stone   . Kidney stone   . Migraine   . Orthostatic hypotension   . Osteopenia 03/31/2012  . Ulcerative colitis ([place]) 03/31/2012    Past Surgical History:  Procedure Laterality Date  . CESAREAN SECTION     x3  . CHOLECYSTECTOMY    . COLONOSCOPY    . LITHOTRIPSY    . THYROIDECTOMY     1997    There were no vitals filed for this visit.   Subjective Assessment - 09/08/20 1111    Subjective Pt states her shoulder feels worse than before. it feels "stuck" and as if "there is a shaken soda" within the joint and keping it from moving.    Limitations Lifting;Writing;House hold activities    Patient Stated Goals Decreased pain    Currently in Pain? Yes    Pain Score 8     Pain Location Shoulder    Pain Orientation Left    Pain Descriptors / Indicators Aching;Sore    Pain Type Chronic pain    Pain Onset More than a month ago    Pain Frequency Intermittent              [facility] PT Assessment - 09/08/20 0001      Assessment   Medical Diagnosis L shoulder pain    Referring Provider (PT) Bevely Palmer Persons, [place]    Hand Dominance Right    Prior Therapy no      Prior  Function   Level of Independence Independent      Cognition   Overall Cognitive Status Within Functional Limits for tasks assessed      AROM   Overall AROM Comments Behind back IR: to belt line/pain    Right Shoulder Flexion 110 Degrees    Right Shoulder ABduction 86 Degrees    Right Shoulder Internal Rotation 43 Degrees    Right Shoulder External Rotation 30 Degrees      PROM   Left Shoulder Flexion 122 Degrees   p!   Left Shoulder ABduction 100 Degrees   p!   Left Shoulder Internal Rotation 53 Degrees   p!   Left Shoulder External Rotation 55 Degrees   p!     Strength   Left Shoulder Flexion 3+/5    Left Shoulder ABduction 3+/5    Left Shoulder Internal Rotation 4-/5    Left Shoulder External Rotation 4/5      Palpation   Palpation comment Soreness in anterior/lateral shoulder radiating into bicep groove.  [facility] Adult PT Treatment/Exercise - 09/08/20 1059      Exercises   Exercises Shoulder      Shoulder Exercises: Supine   External Rotation AAROM;15 reps    External Rotation Limitations cane    Flexion AAROM;15 reps    Flexion Limitations --      Shoulder Exercises: Seated   Retraction 10 reps      Shoulder Exercises: Standing   Row 20 reps    Theraband Level (Shoulder Row) Level 2 (Red)      Shoulder Exercises: Pulleys   Flexion 2 minutes      Shoulder Exercises: ROM/Strengthening   Wall Wash --    Other ROM/Strengthening Exercises shoulder IR behind back: Ext, side , and hands together, x 10 ea with stick;      Shoulder Exercises: Stretch   Corner Stretch 5 reps;20 seconds    Corner Stretch Limitations doorway, 45 deg;    Table Stretch - Abduction 10 seconds;Other (comment)   10 reps   Table Stretch - External Rotation 10 seconds;Other (comment)   10 reps   Other Shoulder Stretches --    Other Shoulder Stretches Wall walk 10s 10x      Modalities   Modalities --      Iontophoresis   Type of Iontophoresis --     Location --    Time --      Manual Therapy   Manual Therapy Joint mobilization;Soft tissue mobilization;Passive ROM    Joint Mobilization post and inf GHJ mobs gr 3;    Passive ROM L shoulder, all motions                  PT Education - 09/08/20 1138    Education Details HEP, shoulder anatomy, exercise progression    Person(s) Educated Patient    Methods Explanation;Demonstration;Tactile cues    Comprehension Verbalized understanding;Returned demonstration            PT Short Term Goals - 09/08/20 1207      PT SHORT TERM GOAL #1   Title Pt to be independent wtih initial HEP    Time 2    Period Weeks    Status Partially Met    Target Date 09/22/20      PT SHORT TERM GOAL #2   Title Pt to demo improved PROM for flexion by at least 15 deg.    Time 2    Period Weeks    Status On-going    Target Date 09/22/20             PT Long Term Goals - 09/08/20 1208      PT LONG TERM GOAL #1   Title Pt to be independent with final HEP    Time 8    Period Weeks    Status Partially Met    Target Date 11/03/20      PT LONG TERM GOAL #2   Title Pt to demo improved AROM of L shoulder to be WNL to improve ability for ADLs and IADLS.    Time 8    Period Weeks    Status On-going    Target Date 11/03/20      PT LONG TERM GOAL #3   Title Pt to demo improved strength of L shouder, to be at least 4+/5 to improve ability for reaching, lifting, carrying.    Time 8    Period Weeks    Status New      PT LONG TERM GOAL #4  Title Pt to report decreased pain in L shoulder to 0-1/10 with activity.    Time 6    Period Weeks    Status New                 Plan - 09/08/20 1141    Clinical Impression Statement Pt demonstrates continued L shoulder stiffness at today's session that improved minimally with joint mobilization and STM. Pt responded well to table, closed chain stretching exercises and AAROM holds at end range. Pt continues to demonstrate significant L  shoulder pain, strength, and ROM deficits that are limiting her ability to fully perform and participate in self care tasks and ADL. Pt is most limited in external and internal rotation during behind back reaching. Signs and symptoms suggest potential adhesive capsulitis. Pt would benefit from continued skilled therapy in order to address functional deficits, reach goals, and maximize return to PLOF.    Personal Factors and Comorbidities Comorbidity 1;Time since onset of injury/illness/exacerbation;Sex    Examination-Activity Limitations Carry;Lift;Reach Overhead;Dressing    Examination-Participation Restrictions Cleaning;Community Activity;Laundry;Shop    Stability/Clinical Decision Making Stable/Uncomplicated    Clinical Decision Making Low    Rehab Potential Good    PT Frequency 2x / week    PT Duration 8 weeks    PT Treatment/Interventions ADLs/Self Care Home Management;Cryotherapy;Electrical Stimulation;Iontophoresis 8m/ml Dexamethasone;Moist Heat;Therapeutic exercise;Therapeutic activities;Functional mobility training;DME Instruction;Ultrasound;Neuromuscular re-education;Patient/family education;Manual techniques;Vasopneumatic Device;Taping;Energy conservation;Dry needling;Passive range of motion;Spinal Manipulations;Joint Manipulations    PT Home Exercise Plan [id number]   Consulted and Agree with Plan of Care Patient           Patient will benefit from skilled therapeutic intervention in order to improve the following deficits and impairments:  Hypomobility,Decreased activity tolerance,Decreased strength,Pain,Impaired UE functional use,Increased muscle spasms,Decreased mobility,Decreased range of motion,Impaired flexibility  Visit Diagnosis: Acute pain of left shoulder - Plan: PT plan of care cert/re-cert     Problem List Patient Active Problem List   Diagnosis Date Noted  . Osteoporosis 07/11/2020  . Atherosclerosis of native coronary artery of native heart without angina  pectoris 06/08/2019  . Aortic atherosclerosis ([place] 06/08/2019  . Mixed hyperlipidemia 05/02/2019  . Postural dizziness with presyncope 05/02/2019  . HSV-2 (herpes simplex virus 2) infection 11/04/2018  . Hypothyroidism s/p total thyroidectomy 1997 08/15/2018  . Autoimmune hepatitis ([place] 08/16/2012  . Asthma 03/31/2012  . Ulcerative colitis ([place] 03/31/2012  . History of renal calculi 03/31/2012  . Migraines 03/31/2012  . Mitral valve prolapse     ADaleen BoPT, DPT 09/08/20 12:23 PM   [place] [address]Phone: [phone]  Fax:  [phone] Name: EJeneane PieczynskiMRN: [id number] of Birth: [birth year]

## 2020-09-15 ENCOUNTER — Ambulatory Visit (INDEPENDENT_AMBULATORY_CARE_PROVIDER_SITE_OTHER): Payer: BC Managed Care – PPO | Admitting: Physical Therapy

## 2020-09-15 ENCOUNTER — Other Ambulatory Visit: Payer: Self-pay

## 2020-09-15 DIAGNOSIS — M25512 Pain in left shoulder: Secondary | ICD-10-CM

## 2020-09-17 ENCOUNTER — Encounter: Payer: Self-pay | Admitting: Physical Therapy

## 2020-09-17 NOTE — Therapy (Signed)
[facility] [address] [place], [place], [address] Phone: [phone]   Fax:  [phone]  Physical Therapy Treatment  Patient Details  Name: Danielle Bond MRN: [id number] Date of Birth: 11-Sep-1960 Referring Provider (PT): Bevely Palmer Persons, [place]   Encounter Date: 09/15/2020   PT End of Session - 09/17/20 1419    Visit Number 4    Number of Visits 12    Date for PT Re-Evaluation 10/08/20    Authorization Type BCBS    PT Start Time 1515    PT Stop Time 1555    PT Time Calculation (min) 40 min    Activity Tolerance Patient tolerated treatment well    Behavior During Therapy [facility] for tasks assessed/performed           Past Medical History:  Diagnosis Date  . Asthma   . Chronic kidney disease   . Hx of pancreatitis 06/16/2018  . Kidney stone   . Kidney stone   . Migraine   . Orthostatic hypotension   . Osteopenia 03/31/2012  . Ulcerative colitis ([place]) 03/31/2012    Past Surgical History:  Procedure Laterality Date  . CESAREAN SECTION     x3  . CHOLECYSTECTOMY    . COLONOSCOPY    . LITHOTRIPSY    . THYROIDECTOMY     1997    There were no vitals filed for this visit.   Subjective Assessment - 09/17/20 1418    Subjective Pt states shoulder feels stiff, most pain in anterior/lateral shoulder    Currently in Pain? Yes    Pain Score 6     Pain Location Shoulder    Pain Orientation Left    Pain Descriptors / Indicators Aching    Pain Type Chronic pain    Pain Onset More than a month ago    Pain Frequency Intermittent                             [facility] Adult PT Treatment/Exercise - 09/17/20 0001      Exercises   Exercises Shoulder      Shoulder Exercises: Supine   External Rotation AAROM;15 reps    External Rotation Limitations cane    Flexion AAROM;15 reps      Shoulder Exercises: Standing   Row 20 reps    Theraband Level (Shoulder Row) Level 2 (Red)      Shoulder Exercises: Pulleys    Flexion 2 minutes    ABduction 1 minute      Shoulder Exercises: Stretch   Corner Stretch 5 reps;20 seconds    Corner Stretch Limitations doorway, 45 deg;    Other Shoulder Stretches ER butterfly st 5 sec x 10    Other Shoulder Stretches Wall walk 10s 10x      Manual Therapy   Manual Therapy Joint mobilization;Soft tissue mobilization;Passive ROM    Joint Mobilization post and inf GHJ mobs gr 3;    Soft tissue mobilization STM anterior and lateral shoulder at proximal bicep and deltoid    Passive ROM L shoulder, all motions                    PT Short Term Goals - 09/08/20 1207      PT SHORT TERM GOAL #1   Title Pt to be independent wtih initial HEP    Time 2    Period Weeks    Status Partially Met    Target  Date 09/22/20      PT SHORT TERM GOAL #2   Title Pt to demo improved PROM for flexion by at least 15 deg.    Time 2    Period Weeks    Status On-going    Target Date 09/22/20             PT Long Term Goals - 09/08/20 1208      PT LONG TERM GOAL #1   Title Pt to be independent with final HEP    Time 8    Period Weeks    Status Partially Met    Target Date 11/03/20      PT LONG TERM GOAL #2   Title Pt to demo improved AROM of L shoulder to be WNL to improve ability for ADLs and IADLS.    Time 8    Period Weeks    Status On-going    Target Date 11/03/20      PT LONG TERM GOAL #3   Title Pt to demo improved strength of L shouder, to be at least 4+/5 to improve ability for reaching, lifting, carrying.    Time 8    Period Weeks    Status New      PT LONG TERM GOAL #4   Title Pt to report decreased pain in L shoulder to 0-1/10 with activity.    Time 6    Period Weeks    Status New                 Plan - 09/17/20 1423    Clinical Impression Statement Pt continues to have joinst stiffness that is limiting ROM. Manual done for pain and improving ROM. WIll ocntinue to focus on ROM and progress into strengthening as tolerated.    Personal  Factors and Comorbidities Comorbidity 1;Time since onset of injury/illness/exacerbation;Sex    Examination-Activity Limitations Carry;Lift;Reach Overhead;Dressing    Examination-Participation Restrictions Cleaning;Community Activity;Laundry;Shop    Stability/Clinical Decision Making Stable/Uncomplicated    Rehab Potential Good    PT Frequency 2x / week    PT Duration 8 weeks    PT Treatment/Interventions ADLs/Self Care Home Management;Cryotherapy;Electrical Stimulation;Iontophoresis 55m/ml Dexamethasone;Moist Heat;Therapeutic exercise;Therapeutic activities;Functional mobility training;DME Instruction;Ultrasound;Neuromuscular re-education;Patient/family education;Manual techniques;Vasopneumatic Device;Taping;Energy conservation;Dry needling;Passive range of motion;Spinal Manipulations;Joint Manipulations    PT Home Exercise Plan [id number]   Consulted and Agree with Plan of Care Patient           Patient will benefit from skilled therapeutic intervention in order to improve the following deficits and impairments:  Hypomobility,Decreased activity tolerance,Decreased strength,Pain,Impaired UE functional use,Increased muscle spasms,Decreased mobility,Decreased range of motion,Impaired flexibility  Visit Diagnosis: Acute pain of left shoulder     Problem List Patient Active Problem List   Diagnosis Date Noted  . Osteoporosis 07/11/2020  . Atherosclerosis of native coronary artery of native heart without angina pectoris 06/08/2019  . Aortic atherosclerosis ([place] 06/08/2019  . Mixed hyperlipidemia 05/02/2019  . Postural dizziness with presyncope 05/02/2019  . HSV-2 (herpes simplex virus 2) infection 11/04/2018  . Hypothyroidism s/p total thyroidectomy 1997 08/15/2018  . Autoimmune hepatitis ([place] 08/16/2012  . Asthma 03/31/2012  . Ulcerative colitis ([place] 03/31/2012  . History of renal calculi 03/31/2012  . Migraines 03/31/2012  . Mitral valve prolapse     LLyndee Hensen PT,  DPT 2:24 PM  09/17/20    [place] [address]Phone: [phone]  Fax:  [phone] Name: Danielle MagnerMRN: [id number]  of Birth: [birth year]

## 2020-09-19 ENCOUNTER — Other Ambulatory Visit: Payer: Self-pay

## 2020-09-19 ENCOUNTER — Encounter: Payer: Self-pay | Admitting: Physical Therapy

## 2020-09-19 ENCOUNTER — Ambulatory Visit (INDEPENDENT_AMBULATORY_CARE_PROVIDER_SITE_OTHER): Payer: BC Managed Care – PPO | Admitting: Physical Therapy

## 2020-09-19 DIAGNOSIS — M25512 Pain in left shoulder: Secondary | ICD-10-CM | POA: Diagnosis not present

## 2020-09-19 NOTE — Therapy (Signed)
[facility] [address] [place], [place], [address] Phone: [phone]   Fax:  [phone]  Physical Therapy Treatment  Patient Details  Name: Danielle Bond MRN: [id number] Date of Birth: 12/23/1960 Referring Provider (PT): Bevely Palmer Persons, [place]   Encounter Date: 09/19/2020   PT End of Session - 09/19/20 1231    Visit Number 5    Number of Visits 12    Date for PT Re-Evaluation 10/08/20    Authorization Type BCBS    PT Start Time 1017    PT Stop Time 1100    PT Time Calculation (min) 43 min    Activity Tolerance Patient tolerated treatment well    Behavior During Therapy [facility] for tasks assessed/performed           Past Medical History:  Diagnosis Date  . Asthma   . Chronic kidney disease   . Hx of pancreatitis 06/16/2018  . Kidney stone   . Kidney stone   . Migraine   . Orthostatic hypotension   . Osteopenia 03/31/2012  . Ulcerative colitis ([place]) 03/31/2012    Past Surgical History:  Procedure Laterality Date  . CESAREAN SECTION     x3  . CHOLECYSTECTOMY    . COLONOSCOPY    . LITHOTRIPSY    . THYROIDECTOMY     1997    There were no vitals filed for this visit.   Subjective Assessment - 09/19/20 1230    Subjective Pt states decreased soreness after last visit. Today feels stiff and sore again.    Currently in Pain? Yes    Pain Score 6     Pain Location Shoulder    Pain Orientation Left    Pain Descriptors / Indicators Aching    Pain Type Chronic pain    Pain Onset More than a month ago    Pain Frequency Intermittent                             [facility] Adult PT Treatment/Exercise - 09/19/20 0001      Exercises   Exercises Shoulder      Shoulder Exercises: Supine   External Rotation AAROM;15 reps    External Rotation Limitations cane    Flexion AAROM;15 reps    Flexion Limitations cane      Shoulder Exercises: Standing   Flexion 10 reps;AROM    Row --    Theraband Level (Shoulder  Row) --      Shoulder Exercises: Pulleys   Flexion 2 minutes      Shoulder Exercises: Stretch   Corner Stretch --    Warehouse manager Limitations --    Other Shoulder Stretches ER butterfly st 5 sec x 10; IR behind back: stick: extension, side, and 2 hand hold x10 ea 5 sec;    Other Shoulder Stretches Wall slides  10s 10x.      Manual Therapy   Manual Therapy Joint mobilization;Soft tissue mobilization;Passive ROM    Joint Mobilization post and inf GHJ mobs gr 3;    Soft tissue mobilization STM /IASTM to anterior and lateral shoulder at proximal bicep and deltoid    Passive ROM L shoulder, all motions                    PT Short Term Goals - 09/08/20 1207      PT SHORT TERM GOAL #1   Title Pt to be independent wtih initial HEP  Time 2    Period Weeks    Status Partially Met    Target Date 09/22/20      PT SHORT TERM GOAL #2   Title Pt to demo improved PROM for flexion by at least 15 deg.    Time 2    Period Weeks    Status On-going    Target Date 09/22/20             PT Long Term Goals - 09/08/20 1208      PT LONG TERM GOAL #1   Title Pt to be independent with final HEP    Time 8    Period Weeks    Status Partially Met    Target Date 11/03/20      PT LONG TERM GOAL #2   Title Pt to demo improved AROM of L shoulder to be WNL to improve ability for ADLs and IADLS.    Time 8    Period Weeks    Status On-going    Target Date 11/03/20      PT LONG TERM GOAL #3   Title Pt to demo improved strength of L shouder, to be at least 4+/5 to improve ability for reaching, lifting, carrying.    Time 8    Period Weeks    Status New      PT LONG TERM GOAL #4   Title Pt to report decreased pain in L shoulder to 0-1/10 with activity.    Time 6    Period Weeks    Status New                 Plan - 09/19/20 1235    Clinical Impression Statement Pt with mild improvments in ROM. Does have pain and stiffness at end ranges with PROM. Also very limited with  behind the back IR, will continue focus on restoring ROM.    Personal Factors and Comorbidities Comorbidity 1;Time since onset of injury/illness/exacerbation;Sex    Examination-Activity Limitations Carry;Lift;Reach Overhead;Dressing    Examination-Participation Restrictions Cleaning;Community Activity;Laundry;Shop    Stability/Clinical Decision Making Stable/Uncomplicated    Rehab Potential Good    PT Frequency 2x / week    PT Duration 8 weeks    PT Treatment/Interventions ADLs/Self Care Home Management;Cryotherapy;Electrical Stimulation;Iontophoresis 61m/ml Dexamethasone;Moist Heat;Therapeutic exercise;Therapeutic activities;Functional mobility training;DME Instruction;Ultrasound;Neuromuscular re-education;Patient/family education;Manual techniques;Vasopneumatic Device;Taping;Energy conservation;Dry needling;Passive range of motion;Spinal Manipulations;Joint Manipulations    PT Home Exercise Plan [id number]   Consulted and Agree with Plan of Care Patient           Patient will benefit from skilled therapeutic intervention in order to improve the following deficits and impairments:  Hypomobility,Decreased activity tolerance,Decreased strength,Pain,Impaired UE functional use,Increased muscle spasms,Decreased mobility,Decreased range of motion,Impaired flexibility  Visit Diagnosis: Acute pain of left shoulder     Problem List Patient Active Problem List   Diagnosis Date Noted  . Osteoporosis 07/11/2020  . Atherosclerosis of native coronary artery of native heart without angina pectoris 06/08/2019  . Aortic atherosclerosis ([place] 06/08/2019  . Mixed hyperlipidemia 05/02/2019  . Postural dizziness with presyncope 05/02/2019  . HSV-2 (herpes simplex virus 2) infection 11/04/2018  . Hypothyroidism s/p total thyroidectomy 1997 08/15/2018  . Autoimmune hepatitis ([place] 08/16/2012  . Asthma 03/31/2012  . Ulcerative colitis ([place] 03/31/2012  . History of renal calculi 03/31/2012  . Migraines  03/31/2012  . Mitral valve prolapse    LLyndee Hensen PT, DPT 12:37 PM  09/19/20    [place]  [place], [place], [address] Phone: [phone]   Fax:  [phone]  Name: Sofiah Lyne MRN: [id number] Date of Birth: 1960/12/19

## 2020-09-20 ENCOUNTER — Telehealth: Payer: Self-pay

## 2020-09-20 NOTE — Telephone Encounter (Signed)
Pt states she has a lot of bruises on her left arm after physical therapy on Monday. She believes its from her arm being numb for so long and now it is getting circulation. Pt just wants to make sure this is okay. Told pt we would call her back

## 2020-09-21 ENCOUNTER — Encounter: Payer: BC Managed Care – PPO | Admitting: Physical Therapy

## 2020-09-22 ENCOUNTER — Encounter: Payer: BC Managed Care – PPO | Admitting: Physical Therapy

## 2020-09-26 ENCOUNTER — Encounter: Payer: Self-pay | Admitting: Physical Therapy

## 2020-09-26 ENCOUNTER — Ambulatory Visit (INDEPENDENT_AMBULATORY_CARE_PROVIDER_SITE_OTHER): Payer: BC Managed Care – PPO | Admitting: Physical Therapy

## 2020-09-26 ENCOUNTER — Other Ambulatory Visit: Payer: Self-pay

## 2020-09-26 DIAGNOSIS — M25512 Pain in left shoulder: Secondary | ICD-10-CM | POA: Diagnosis not present

## 2020-09-26 NOTE — Therapy (Signed)
[facility] [address] [place], [place], [address] Phone: [phone]   Fax:  [phone]  Physical Therapy Treatment  Patient Details  Name: Danielle Bond MRN: [id number] Date of Birth: [birth year] Referring Provider (PT): Bevely Palmer Persons, [place]   Encounter Date: 09/26/2020   PT End of Session - 09/26/20 1127    Visit Number 6    Number of Visits 12    Date for PT Re-Evaluation 10/08/20    Authorization Type BCBS    PT Start Time 1105    PT Stop Time 1145    PT Time Calculation (min) 40 min    Activity Tolerance Patient tolerated treatment well    Behavior During Therapy [facility] for tasks assessed/performed           Past Medical History:  Diagnosis Date  . Asthma   . Chronic kidney disease   . Hx of pancreatitis 06/16/2018  . Kidney stone   . Kidney stone   . Migraine   . Orthostatic hypotension   . Osteopenia 03/31/2012  . Ulcerative colitis ([place]) 03/31/2012    Past Surgical History:  Procedure Laterality Date  . CESAREAN SECTION     x3  . CHOLECYSTECTOMY    . COLONOSCOPY    . LITHOTRIPSY    . THYROIDECTOMY     1997    There were no vitals filed for this visit.   Subjective Assessment - 09/26/20 1109    Subjective Pt states she was able to sleep on the L arm a little bit so she things there is some improvement. She states when lifting OH, she feels that the L arm does not come up and feels stuck.    Limitations Lifting;Writing;House hold activities    Patient Stated Goals Decreased pain    Currently in Pain? Yes    Pain Score 8     Pain Location Shoulder    Pain Orientation Left    Pain Descriptors / Indicators Aching    Pain Type Chronic pain    Pain Onset More than a month ago                             [facility] Adult PT Treatment/Exercise - 09/26/20 0001      Exercises   Exercises Shoulder      Shoulder Exercises: Supine   External Rotation AAROM;15 reps    External Rotation  Limitations cane    Flexion AAROM;15 reps    Flexion Limitations cane      Shoulder Exercises: Seated   Other Seated Exercises table ER stretch 10s 10x      Shoulder Exercises: Standing   Flexion --      Shoulder Exercises: Pulleys   Flexion 2 minutes    Scaption 2 minutes      Shoulder Exercises: Stretch   Other Shoulder Stretches ER butterfly st 5 sec x 10; IR behind back: stick: extension, side, and 2 hand hold x10 ea 5 sec;    Other Shoulder Stretches Wall slides  10s 10x.      Manual Therapy   Manual Therapy Joint mobilization;Soft tissue mobilization;Passive ROM    Joint Mobilization post and inf GHJ mobs gr 3;    Soft tissue mobilization --    Passive ROM L shoulder, all motions                  PT Education - 09/26/20 1125    Education  Details HEP, shoulder anatomy, exercise progression    Person(s) Educated Patient    Methods Explanation;Demonstration;Tactile cues    Comprehension Verbalized understanding;Returned demonstration            PT Short Term Goals - 09/08/20 1207      PT SHORT TERM GOAL #1   Title Pt to be independent wtih initial HEP    Time 2    Period Weeks    Status Partially Met    Target Date 09/22/20      PT SHORT TERM GOAL #2   Title Pt to demo improved PROM for flexion by at least 15 deg.    Time 2    Period Weeks    Status On-going    Target Date 09/22/20             PT Long Term Goals - 09/08/20 1208      PT LONG TERM GOAL #1   Title Pt to be independent with final HEP    Time 8    Period Weeks    Status Partially Met    Target Date 11/03/20      PT LONG TERM GOAL #2   Title Pt to demo improved AROM of L shoulder to be WNL to improve ability for ADLs and IADLS.    Time 8    Period Weeks    Status On-going    Target Date 11/03/20      PT LONG TERM GOAL #3   Title Pt to demo improved strength of L shouder, to be at least 4+/5 to improve ability for reaching, lifting, carrying.    Time 8    Period Weeks     Status New      PT LONG TERM GOAL #4   Title Pt to report decreased pain in L shoulder to 0-1/10 with activity.    Time 6    Period Weeks    Status New                 Plan - 09/26/20 1128    Clinical Impression Statement Pt responded well to joint mobilizations with decrease in pain and increased ROM following. Pt was able reach [facility] with improved flexion after grade II-III inf mob. Pt tolerates CKC table stretching better than OKC at wall. However, with VC and TC for scapular position, flexion and scaption at pulleys improved. Pt gave verbal understanding to edu re acceptable levels of discomfort with stretching. Pt would benefit from continued skilled therapy in order to improve UE function for return to PLOF.    Personal Factors and Comorbidities Comorbidity 1;Time since onset of injury/illness/exacerbation;Sex    Examination-Activity Limitations Carry;Lift;Reach Overhead;Dressing    Examination-Participation Restrictions Cleaning;Community Activity;Laundry;Shop    Stability/Clinical Decision Making Stable/Uncomplicated    Rehab Potential Good    PT Frequency 2x / week    PT Duration 8 weeks    PT Treatment/Interventions ADLs/Self Care Home Management;Cryotherapy;Electrical Stimulation;Iontophoresis 78m/ml Dexamethasone;Moist Heat;Therapeutic exercise;Therapeutic activities;Functional mobility training;DME Instruction;Ultrasound;Neuromuscular re-education;Patient/family education;Manual techniques;Vasopneumatic Device;Taping;Energy conservation;Dry needling;Passive range of motion;Spinal Manipulations;Joint Manipulations    PT Home Exercise Plan [id number]   Consulted and Agree with Plan of Care Patient           Patient will benefit from skilled therapeutic intervention in order to improve the following deficits and impairments:  Hypomobility,Decreased activity tolerance,Decreased strength,Pain,Impaired UE functional use,Increased muscle spasms,Decreased mobility,Decreased  range of motion,Impaired flexibility  Visit Diagnosis: Acute pain of left shoulder     Problem List Patient  Active Problem List   Diagnosis Date Noted  . Osteoporosis 07/11/2020  . Atherosclerosis of native coronary artery of native heart without angina pectoris 06/08/2019  . Aortic atherosclerosis ([place]) 06/08/2019  . Mixed hyperlipidemia 05/02/2019  . Postural dizziness with presyncope 05/02/2019  . HSV-2 (herpes simplex virus 2) infection 11/04/2018  . Hypothyroidism s/p total thyroidectomy 1997 08/15/2018  . Autoimmune hepatitis ([place]) 08/16/2012  . Asthma 03/31/2012  . Ulcerative colitis ([place]) 03/31/2012  . History of renal calculi 03/31/2012  . Migraines 03/31/2012  . Mitral valve prolapse     Daleen Bo PT, DPT 09/26/20 11:45 AM   [place], [place], [address] Phone: [phone]   Fax:  [phone]  Name: Danielle Bond MRN: [id number] Date of Birth: August 24, 1960

## 2020-09-28 ENCOUNTER — Ambulatory Visit (INDEPENDENT_AMBULATORY_CARE_PROVIDER_SITE_OTHER): Payer: BC Managed Care – PPO | Admitting: Physical Therapy

## 2020-09-28 ENCOUNTER — Encounter: Payer: Self-pay | Admitting: Physical Therapy

## 2020-09-28 ENCOUNTER — Other Ambulatory Visit: Payer: Self-pay

## 2020-09-28 DIAGNOSIS — M25512 Pain in left shoulder: Secondary | ICD-10-CM | POA: Diagnosis not present

## 2020-09-28 NOTE — Therapy (Signed)
[place] [address] [name], [place], [address] Phone: [phone]   Fax:  [phone]  Physical Therapy Treatment  Patient Details  Name: Danielle Bond MRN: [id number] Date of Birth: [birth year] Referring Provider (PT): Bevely Palmer Persons, [place]   Encounter Date: 09/28/2020   PT End of Session - 09/28/20 1140    Visit Number 7    Number of Visits 12    Date for PT Re-Evaluation 10/08/20    Authorization Type BCBS    PT Start Time 1105    PT Stop Time 1145    PT Time Calculation (min) 40 min    Activity Tolerance Patient tolerated treatment well    Behavior During Therapy [facility] for tasks assessed/performed           Past Medical History:  Diagnosis Date  . Asthma   . Chronic kidney disease   . Hx of pancreatitis 06/16/2018  . Kidney stone   . Kidney stone   . Migraine   . Orthostatic hypotension   . Osteopenia 03/31/2012  . Ulcerative colitis ([place]) 03/31/2012    Past Surgical History:  Procedure Laterality Date  . CESAREAN SECTION     x3  . CHOLECYSTECTOMY    . COLONOSCOPY    . LITHOTRIPSY    . THYROIDECTOMY     1997    There were no vitals filed for this visit.   Subjective Assessment - 09/28/20 1136    Subjective She states the arm is more sore today compared to last visit. She states her shoulder is painful to touch but she feels that her arm is moving a little better.    Limitations Lifting;Writing;House hold activities    Patient Stated Goals Decreased pain    Currently in Pain? Yes    Pain Score 6     Pain Location Shoulder    Pain Orientation Left    Pain Descriptors / Indicators Aching    Pain Onset More than a month ago    Pain Frequency Intermittent    Aggravating Factors  sleeping on that side, reaching, dressing, ADL    Pain Relieving Factors rest, heat                             [facility] Adult PT Treatment/Exercise - 09/28/20 0001      Exercises   Exercises Shoulder       Shoulder Exercises: Supine   External Rotation --    External Rotation Limitations --    Flexion --    Flexion Limitations --      Shoulder Exercises: Seated   Other Seated Exercises table ER, ABD, flexion stretch 10s 10x      Shoulder Exercises: Pulleys   Flexion 2 minutes    Scaption 2 minutes      Shoulder Exercises: Stretch   Other Shoulder Stretches --    Other Shoulder Stretches Wall slides  10s 10x.      Manual Therapy   Manual Therapy Joint mobilization;Soft tissue mobilization;Passive ROM    Joint Mobilization post and inf GHJ mobs gr 2-3;    Soft tissue mobilization STM ant delt, biceps, UT, infra    Passive ROM                  PT Education - 09/28/20 1139    Education Details HEP update, thermotherapy, exercise intensity, acceptable level of pain    Person(s) Educated Patient  Methods Explanation;Demonstration;Tactile cues    Comprehension Verbalized understanding;Returned demonstration            PT Short Term Goals - 09/08/20 1207      PT SHORT TERM GOAL #1   Title Pt to be independent wtih initial HEP    Time 2    Period Weeks    Status Partially Met    Target Date 09/22/20      PT SHORT TERM GOAL #2   Title Pt to demo improved PROM for flexion by at least 15 deg.    Time 2    Period Weeks    Status On-going    Target Date 09/22/20             PT Long Term Goals - 09/08/20 1208      PT LONG TERM GOAL #1   Title Pt to be independent with final HEP    Time 8    Period Weeks    Status Partially Met    Target Date 11/03/20      PT LONG TERM GOAL #2   Title Pt to demo improved AROM of L shoulder to be WNL to improve ability for ADLs and IADLS.    Time 8    Period Weeks    Status On-going    Target Date 11/03/20      PT LONG TERM GOAL #3   Title Pt to demo improved strength of L shouder, to be at least 4+/5 to improve ability for reaching, lifting, carrying.    Time 8    Period Weeks    Status New      PT LONG TERM GOAL  #4   Title Pt to report decreased pain in L shoulder to 0-1/10 with activity.    Time 6    Period Weeks    Status New                 Plan - 09/28/20 1141    Clinical Impression Statement Pt presents with increased shoulder stiffness, muscular tension, and guarding at today's session. Pt responded well to gentle mobilizations and STM for periscapular and RTC with improved ROM following. She was able to progress stretching at today's session and reported that the shoulder did feel better with CKC stretching vs OKC. Pt required VC and TC for decreased UT compensation once past 90. Pt gave verbal understanding to edu re acceptable levels of discomfort with stretching. Pt would benefit from continued skilled therapy in order to improve UE function for return to PLOF.    Personal Factors and Comorbidities Comorbidity 1;Time since onset of injury/illness/exacerbation;Sex    Examination-Activity Limitations Carry;Lift;Reach Overhead;Dressing    Examination-Participation Restrictions Cleaning;Community Activity;Laundry;Shop    Stability/Clinical Decision Making Stable/Uncomplicated    Rehab Potential Good    PT Frequency 2x / week    PT Duration 8 weeks    PT Treatment/Interventions ADLs/Self Care Home Management;Cryotherapy;Electrical Stimulation;Iontophoresis 27m/ml Dexamethasone;Moist Heat;Therapeutic exercise;Therapeutic activities;Functional mobility training;DME Instruction;Ultrasound;Neuromuscular re-education;Patient/family education;Manual techniques;Vasopneumatic Device;Taping;Energy conservation;Dry needling;Passive range of motion;Spinal Manipulations;Joint Manipulations    PT Home Exercise Plan [id number]   Consulted and Agree with Plan of Care Patient           Patient will benefit from skilled therapeutic intervention in order to improve the following deficits and impairments:  Hypomobility,Decreased activity tolerance,Decreased strength,Pain,Impaired UE functional use,Increased  muscle spasms,Decreased mobility,Decreased range of motion,Impaired flexibility  Visit Diagnosis: Acute pain of left shoulder     Problem List Patient Active Problem List  Diagnosis Date Noted  . Osteoporosis 07/11/2020  . Atherosclerosis of native coronary artery of native heart without angina pectoris 06/08/2019  . Aortic atherosclerosis ([place]) 06/08/2019  . Mixed hyperlipidemia 05/02/2019  . Postural dizziness with presyncope 05/02/2019  . HSV-2 (herpes simplex virus 2) infection 11/04/2018  . Hypothyroidism s/p total thyroidectomy 1997 08/15/2018  . Autoimmune hepatitis ([place]) 08/16/2012  . Asthma 03/31/2012  . Ulcerative colitis ([place]) 03/31/2012  . History of renal calculi 03/31/2012  . Migraines 03/31/2012  . Mitral valve prolapse     Daleen Bo PT, DPT 09/28/20 11:57 AM   [facility] [address] [place], [place], [address] Phone: [phone]   Fax:  [phone]  Name: Danielle Bond MRN: [id number] Date of Birth: 05-31-1961

## 2020-09-29 ENCOUNTER — Encounter: Payer: BC Managed Care – PPO | Admitting: Physical Therapy

## 2020-09-30 ENCOUNTER — Ambulatory Visit (INDEPENDENT_AMBULATORY_CARE_PROVIDER_SITE_OTHER): Payer: BC Managed Care – PPO | Admitting: Physician Assistant

## 2020-09-30 ENCOUNTER — Encounter: Payer: Self-pay | Admitting: Physician Assistant

## 2020-09-30 DIAGNOSIS — M5440 Lumbago with sciatica, unspecified side: Secondary | ICD-10-CM | POA: Diagnosis not present

## 2020-09-30 MED ORDER — PREDNISONE 10 MG PO TABS: 10.0000 mg | ORAL_TABLET | Freq: Every day | ORAL | 0 refills | Status: DC

## 2020-09-30 NOTE — Progress Notes (Signed)
Office Visit Note   Patient: Danielle Bond           Date of Birth: 04-20-1961           MRN: [id number] Visit Date: 09/30/2020              Requested by: [name], MD [address] [place],  [place] [address] PCP: [name], MD  No chief complaint on file.     HPI: Patient is a 60 year old woman who complains today are a increasing sciatica in her buttock that radiates down her femur and sometimes numbness shooting into her foot.  This is now been going on since at least November.  This is not shown any significant improvement and actually gotten worse. Assessment & Plan: Visit Diagnoses:  1. Acute right-sided low back pain with sciatica, sciatica laterality unspecified     Plan: Would recommend an MRI.  Should follow-up with Dr. Sharol Given once this is completed.  Would also have her try prednisone though she is wary of this because of increased blood sugars.  She is willing to try it but will monitor her blood sugars and adjust accordingly also have given her prescription for back stabilization physical therapy  Follow-Up Instructions: No follow-ups on file.   Ortho Exam  Patient is alert, oriented, no adenopathy, well-dressed, normal affect, normal respiratory effort. Continued pain in her buttock positive straight leg raise good dorsiflexion plantarflexion strength 5 out of 5  Imaging: No results found. No images are attached to the encounter.  Labs: Lab Results  Component Value Date   ESRSEDRATE 64 (H) 07/09/2019   ESRSEDRATE 50 (H) 06/26/2018   ESRSEDRATE 25 (H) 03/16/2014   CRP 85.3 (H) 06/26/2018   REPTSTATUS 03/12/2014 FINAL 03/10/2014   CULT  03/10/2014    Multiple bacterial morphotypes present, none predominant. Suggest appropriate recollection if clinically indicated. Performed at [facility] 03/22/2014     Lab Results  Component Value Date   ALBUMIN 3.8 05/29/2019   ALBUMIN 3.9 06/26/2018   ALBUMIN 4.1 05/23/2018     Lab Results  Component Value Date   MG 1.6 08/15/2012   No results found for: VD25OH  No results found for: PREALBUMIN CBC EXTENDED Latest Ref Rng & Units 07/11/2020 05/29/2019 04/16/2019  WBC 3.8 - 10.8 Thousand/uL 6.7 6.7 5.6  RBC 3.80 - 5.10 Million/uL 4.26 4.45 4.43  HGB 11.7 - 15.5 g/dL 12.4 13.0 12.9  HCT 35.0 - 45.0 % 38.1 39.0 38.9  PLT 140 - 400 Thousand/uL 303 329.0 293.0  NEUTROABS 1.4 - 7.7 K/uL - 4.6 3.6  LYMPHSABS 0.7 - 4.0 K/uL - 1.6 1.6     There is no height or weight on file to calculate BMI.  Orders:  Orders Placed This Encounter  Procedures  . MR Lumbar Spine w/o contrast  . Ambulatory referral to Physical Therapy   Meds ordered this encounter  Medications  . predniSONE (DELTASONE) 10 MG tablet    Sig: Take 1 tablet (10 mg total) by mouth daily with breakfast.    Dispense:  28 tablet    Refill:  0     Procedures: No procedures performed  Clinical Data: No additional findings.  ROS:  All other systems negative, except as noted in the HPI. Review of Systems  Objective: Vital Signs: LMP 03/03/2013   Specialty Comments:  No specialty comments available.  PMFS History: Patient Active Problem List   Diagnosis Date Noted  . Osteoporosis 07/11/2020  .  Atherosclerosis of native coronary artery of native heart without angina pectoris 06/08/2019  . Aortic atherosclerosis ([place]) 06/08/2019  . Mixed hyperlipidemia 05/02/2019  . Postural dizziness with presyncope 05/02/2019  . HSV-2 (herpes simplex virus 2) infection 11/04/2018  . Hypothyroidism s/p total thyroidectomy 1997 08/15/2018  . Autoimmune hepatitis ([place]) 08/16/2012  . Asthma 03/31/2012  . Ulcerative colitis ([place]) 03/31/2012  . History of renal calculi 03/31/2012  . Migraines 03/31/2012  . Mitral valve prolapse    Past Medical History:  Diagnosis Date  . Asthma   . Chronic kidney disease   . Hx of pancreatitis 06/16/2018  . Kidney stone   . Kidney stone   . Migraine   .  Orthostatic hypotension   . Osteopenia 03/31/2012  . Ulcerative colitis ([place]) 03/31/2012    Family History  Problem Relation Age of Onset  . Cervical cancer Paternal Aunt   . Heart disease Paternal Grandmother   . Heart disease Mother   . Dementia Mother   . Thyroid disease Mother   . Osteoarthritis Mother   . Asthma Mother   . Heart attack Father   . Breast cancer Cousin   . Heart attack Brother   . Heart disease Brother     Past Surgical History:  Procedure Laterality Date  . CESAREAN SECTION     x3  . CHOLECYSTECTOMY    . COLONOSCOPY    . LITHOTRIPSY    . THYROIDECTOMY     1997   Social History   Occupational History  . Occupation: Self-employed  Tobacco Use  . Smoking status: Never Smoker  . Smokeless tobacco: Never Used  Vaping Use  . Vaping Use: Never used  Substance and Sexual Activity  . Alcohol use: No  . Drug use: No  . Sexual activity: Yes

## 2020-10-03 ENCOUNTER — Ambulatory Visit (INDEPENDENT_AMBULATORY_CARE_PROVIDER_SITE_OTHER): Payer: BC Managed Care – PPO | Admitting: Physical Therapy

## 2020-10-03 ENCOUNTER — Encounter: Payer: Self-pay | Admitting: Physical Therapy

## 2020-10-03 ENCOUNTER — Other Ambulatory Visit: Payer: Self-pay

## 2020-10-03 DIAGNOSIS — M25512 Pain in left shoulder: Secondary | ICD-10-CM | POA: Diagnosis not present

## 2020-10-03 NOTE — Therapy (Signed)
[place] [address] [place], [place], [address] Phone: [phone]   Fax:  [phone]  Physical Therapy Treatment  Patient Details  Name: Danielle Bond MRN: [id number] Date of Birth: [birth year] Referring Provider (PT): Bevely Palmer Persons, [place]   Encounter Date: 10/03/2020   PT End of Session - 10/03/20 1233    Visit Number 8    Number of Visits 12    Date for PT Re-Evaluation 10/08/20    Authorization Type BCBS    PT Start Time 0935    PT Stop Time 1013    PT Time Calculation (min) 38 min    Activity Tolerance Patient tolerated treatment well    Behavior During Therapy [facility] for tasks assessed/performed           Past Medical History:  Diagnosis Date  . Asthma   . Chronic kidney disease   . Hx of pancreatitis 06/16/2018  . Kidney stone   . Kidney stone   . Migraine   . Orthostatic hypotension   . Osteopenia 03/31/2012  . Ulcerative colitis ([place]) 03/31/2012    Past Surgical History:  Procedure Laterality Date  . CESAREAN SECTION     x3  . CHOLECYSTECTOMY    . COLONOSCOPY    . LITHOTRIPSY    . THYROIDECTOMY     1997    There were no vitals filed for this visit.   Subjective Assessment - 10/03/20 1232    Subjective Pt states shoulder moving better. Still having hard time reaching behind her back.    Currently in Pain? Yes    Pain Score 4     Pain Location Shoulder    Pain Orientation Left    Pain Descriptors / Indicators Aching    Pain Type Chronic pain    Pain Onset More than a month ago    Pain Frequency Intermittent              [facility] PT Assessment - 10/03/20 0001      AROM   Right Shoulder Flexion 115 Degrees    Right Shoulder ABduction 100 Degrees      PROM   Left Shoulder Flexion 135 Degrees    Left Shoulder Internal Rotation 55 Degrees    Left Shoulder External Rotation 72 Degrees                         [facility] Adult PT Treatment/Exercise - 10/03/20 0001      Exercises    Exercises Shoulder      Shoulder Exercises: Supine   Flexion AAROM;15 reps      Shoulder Exercises: Pulleys   Flexion 2 minutes      Shoulder Exercises: Stretch   Corner Stretch 5 reps;20 seconds    Internal Rotation Stretch 5 reps    Internal Rotation Stretch Limitations behind back, 2 hand hold and using strap x10 sec ;      Manual Therapy   Manual Therapy Joint mobilization;Soft tissue mobilization;Passive ROM    Joint Mobilization post and inf GHJ mobs gr 3; mwm for IR in standing    Passive ROM L shoulder, all motions                    PT Short Term Goals - 09/08/20 1207      PT SHORT TERM GOAL #1   Title Pt to be independent wtih initial HEP    Time 2    Period Weeks  Status Partially Met    Target Date 09/22/20      PT SHORT TERM GOAL #2   Title Pt to demo improved PROM for flexion by at least 15 deg.    Time 2    Period Weeks    Status On-going    Target Date 09/22/20             PT Long Term Goals - 09/08/20 1208      PT LONG TERM GOAL #1   Title Pt to be independent with final HEP    Time 8    Period Weeks    Status Partially Met    Target Date 11/03/20      PT LONG TERM GOAL #2   Title Pt to demo improved AROM of L shoulder to be WNL to improve ability for ADLs and IADLS.    Time 8    Period Weeks    Status On-going    Target Date 11/03/20      PT LONG TERM GOAL #3   Title Pt to demo improved strength of L shouder, to be at least 4+/5 to improve ability for reaching, lifting, carrying.    Time 8    Period Weeks    Status New      PT LONG TERM GOAL #4   Title Pt to report decreased pain in L shoulder to 0-1/10 with activity.    Time 6    Period Weeks    Status New                 Plan - 10/03/20 1237    Clinical Impression Statement Pt with improving ROM for all motions, with measurements today. She continues to have joint stiffness that is limting full ROM, still limited with Flexion and IR. Pt with improved pain  and ROM wtih IR behind back after manual today. Pt to benefit from continued focus on restoring ROM.    Personal Factors and Comorbidities Comorbidity 1;Time since onset of injury/illness/exacerbation;Sex    Examination-Activity Limitations Carry;Lift;Reach Overhead;Dressing    Examination-Participation Restrictions Cleaning;Community Activity;Laundry;Shop    Stability/Clinical Decision Making Stable/Uncomplicated    Rehab Potential Good    PT Frequency 2x / week    PT Duration 8 weeks    PT Treatment/Interventions ADLs/Self Care Home Management;Cryotherapy;Electrical Stimulation;Iontophoresis 20m/ml Dexamethasone;Moist Heat;Therapeutic exercise;Therapeutic activities;Functional mobility training;DME Instruction;Ultrasound;Neuromuscular re-education;Patient/family education;Manual techniques;Vasopneumatic Device;Taping;Energy conservation;Dry needling;Passive range of motion;Spinal Manipulations;Joint Manipulations    PT Home Exercise Plan [id number]   Consulted and Agree with Plan of Care Patient           Patient will benefit from skilled therapeutic intervention in order to improve the following deficits and impairments:  Hypomobility,Decreased activity tolerance,Decreased strength,Pain,Impaired UE functional use,Increased muscle spasms,Decreased mobility,Decreased range of motion,Impaired flexibility  Visit Diagnosis: Acute pain of left shoulder     Problem List Patient Active Problem List   Diagnosis Date Noted  . Osteoporosis 07/11/2020  . Atherosclerosis of native coronary artery of native heart without angina pectoris 06/08/2019  . Aortic atherosclerosis ([place] 06/08/2019  . Mixed hyperlipidemia 05/02/2019  . Postural dizziness with presyncope 05/02/2019  . HSV-2 (herpes simplex virus 2) infection 11/04/2018  . Hypothyroidism s/p total thyroidectomy 1997 08/15/2018  . Autoimmune hepatitis ([place] 08/16/2012  . Asthma 03/31/2012  . Ulcerative colitis ([place] 03/31/2012  .  History of renal calculi 03/31/2012  . Migraines 03/31/2012  . Mitral valve prolapse    LLyndee Hensen PT, DPT 12:44 PM  10/03/20    [facility]  [facility] [address] [place], [place], [address] Phone: [phone]   Fax:  [phone]  Name: Beverley Allender MRN: [id number] Date of Birth: 03-17-1961

## 2020-10-05 ENCOUNTER — Encounter: Payer: BC Managed Care – PPO | Admitting: Physical Therapy

## 2020-10-08 DIAGNOSIS — H6691 Otitis media, unspecified, right ear: Secondary | ICD-10-CM | POA: Diagnosis not present

## 2020-10-10 ENCOUNTER — Other Ambulatory Visit: Payer: Self-pay

## 2020-10-10 ENCOUNTER — Encounter: Payer: Self-pay | Admitting: Physical Therapy

## 2020-10-10 ENCOUNTER — Ambulatory Visit (INDEPENDENT_AMBULATORY_CARE_PROVIDER_SITE_OTHER): Payer: BC Managed Care – PPO | Admitting: Physical Therapy

## 2020-10-10 DIAGNOSIS — M25512 Pain in left shoulder: Secondary | ICD-10-CM

## 2020-10-12 ENCOUNTER — Ambulatory Visit (INDEPENDENT_AMBULATORY_CARE_PROVIDER_SITE_OTHER): Payer: BC Managed Care – PPO | Admitting: Physical Therapy

## 2020-10-12 ENCOUNTER — Encounter: Payer: Self-pay | Admitting: Physical Therapy

## 2020-10-12 ENCOUNTER — Other Ambulatory Visit: Payer: Self-pay

## 2020-10-12 DIAGNOSIS — M25512 Pain in left shoulder: Secondary | ICD-10-CM | POA: Diagnosis not present

## 2020-10-12 NOTE — Therapy (Signed)
[facility] [address] [place], [place], [address] Phone: [phone]   Fax:  [phone]  Physical Therapy Treatment  Patient Details  Name: Danielle Bond MRN: [id number] Date of Birth: 11/07/1960 Referring Provider (PT): Bevely Palmer Persons, [place]   Encounter Date: 10/12/2020   PT End of Session - 10/12/20 1550    Visit Number 10    Number of Visits 20    Date for PT Re-Evaluation 11/23/20    Authorization Type BCBS    PT Start Time 1031    PT Stop Time 1100    PT Time Calculation (min) 45 min    Activity Tolerance Patient tolerated treatment well    Behavior During Therapy [facility] for tasks assessed/performed           Past Medical History:  Diagnosis Date  . Asthma   . Chronic kidney disease   . Hx of pancreatitis 06/16/2018  . Kidney stone   . Kidney stone   . Migraine   . Orthostatic hypotension   . Osteopenia 03/31/2012  . Ulcerative colitis ([place]) 03/31/2012    Past Surgical History:  Procedure Laterality Date  . CESAREAN SECTION     x3  . CHOLECYSTECTOMY    . COLONOSCOPY    . LITHOTRIPSY    . THYROIDECTOMY     1997    There were no vitals filed for this visit.   Subjective Assessment - 10/12/20 1544    Subjective Pt states arm moving a bit better. Did have what she felt was "fatigue " in both arms yesterday after taking her humera injection, which she states has happened before, feels fine today. Still having a hard time with behind the back IR.    Currently in Pain? Yes    Pain Score 3     Pain Location Shoulder    Pain Orientation Left    Pain Descriptors / Indicators Aching    Pain Type Acute pain    Pain Onset More than a month ago    Pain Frequency Intermittent                             [facility] Adult PT Treatment/Exercise - 10/12/20 1552      Exercises   Exercises Shoulder      Shoulder Exercises: Standing   Row 20 reps    Theraband Level (Shoulder Row) Level 3 (Green)       Shoulder Exercises: Pulleys   Flexion 2 minutes    Scaption 1 minute      Shoulder Exercises: Stretch   Corner Stretch 5 reps;20 seconds    Internal Rotation Stretch 5 reps    Internal Rotation Stretch Limitations behind back, 2 hand hold 10  x10 sec ;    Other Shoulder Stretches childs pose 30 sec x 3, L, R center    Other Shoulder Stretches Wall slides  10s 10x.      Manual Therapy   Manual Therapy Joint mobilization;Soft tissue mobilization;Passive ROM    Joint Mobilization post and inf GHJ mobs gr 3;    Passive ROM L shoulder, all motions, subscap release                    PT Short Term Goals - 10/12/20 1207      PT SHORT TERM GOAL #1   Title Pt to be independent wtih initial HEP    Time 2    Period  Weeks    Status Achieved    Target Date 09/22/20      PT SHORT TERM GOAL #2   Title Pt to demo improved PROM for flexion by at least 15 deg.    Time 2    Period Weeks    Status Achieved    Target Date 09/22/20             PT Long Term Goals - 10/12/20 1207      PT LONG TERM GOAL #1   Title Pt to be independent with final HEP    Time 8    Period Weeks    Status Partially Met      PT LONG TERM GOAL #2   Title Pt to demo improved AROM of L shoulder to be WNL to improve ability for ADLs and IADLS.    Time 8    Period Weeks    Status Partially Met      PT LONG TERM GOAL #3   Title Pt to demo improved strength of L shouder, to be at least 4+/5 to improve ability for reaching, lifting, carrying.    Time 8    Period Weeks    Status Partially Met      PT LONG TERM GOAL #4   Title Pt to report decreased pain in L shoulder to 0-1/10 with activity.    Time 6    Period Weeks    Status Partially Met                 Plan - 10/12/20 1555    Clinical Impression Statement Pt continues to show improvments in ROM, but have been slow. She has tightness in subscap and lat, that are also limting ROM. Continued focus has been restoring full ROM. Most  limitations are for flexionand behind the back IR. She continues to have pain with IR. Pt to beneift from continued care.    Personal Factors and Comorbidities Comorbidity 1;Time since onset of injury/illness/exacerbation;Sex    Examination-Activity Limitations Carry;Lift;Reach Overhead;Dressing    Examination-Participation Restrictions Cleaning;Community Activity;Laundry;Shop    Stability/Clinical Decision Making Stable/Uncomplicated    Rehab Potential Good    PT Frequency 2x / week    PT Duration 6 weeks    PT Treatment/Interventions ADLs/Self Care Home Management;Cryotherapy;Electrical Stimulation;Iontophoresis 64m/ml Dexamethasone;Moist Heat;Therapeutic exercise;Therapeutic activities;Functional mobility training;DME Instruction;Ultrasound;Neuromuscular re-education;Patient/family education;Manual techniques;Vasopneumatic Device;Taping;Energy conservation;Dry needling;Passive range of motion;Spinal Manipulations;Joint Manipulations    PT Home Exercise Plan [email or web address]   Consulted and Agree with Plan of Care Patient           Patient will benefit from skilled therapeutic intervention in order to improve the following deficits and impairments:  Hypomobility,Decreased activity tolerance,Decreased strength,Pain,Impaired UE functional use,Increased muscle spasms,Decreased mobility,Decreased range of motion,Impaired flexibility  Visit Diagnosis: Acute pain of left shoulder     Problem List Patient Active Problem List   Diagnosis Date Noted  . Osteoporosis 07/11/2020  . Atherosclerosis of native coronary artery of native heart without angina pectoris 06/08/2019  . Aortic atherosclerosis ([place] 06/08/2019  . Mixed hyperlipidemia 05/02/2019  . Postural dizziness with presyncope 05/02/2019  . HSV-2 (herpes simplex virus 2) infection 11/04/2018  . Hypothyroidism s/p total thyroidectomy 1997 08/15/2018  . [place] 08/16/2012  . Asthma 03/31/2012  . [place]cerative colitis ([place]  03/31/2012  . History of renal calculi 03/31/2012  . Migraines 03/31/2012  . Mitral valve prolapse     LLyndee Hensen PT, DPT 3:57 PM  10/12/20    [facility]  [place], [place], [address] Phone: [phone]   Fax:  [phone]  Name: Danielle Bond MRN: [id number] Date of Birth: 04/23/1961

## 2020-10-12 NOTE — Therapy (Signed)
[facility] [address] [place], [place], [address] Phone: [phone]   Fax:  [phone]  Physical Therapy Treatment/Re-Cert   Patient Details  Name: Danielle Bond MRN: [id number] Date of Birth: Apr 15, 1961 Referring Provider (PT): Bevely Palmer Persons, [place]   Encounter Date: 10/10/2020   PT End of Session - 10/12/20 1206    Visit Number 9    Number of Visits 20    Date for PT Re-Evaluation 11/23/20    Authorization Type BCBS    PT Start Time 1105    PT Stop Time 1144    PT Time Calculation (min) 39 min    Activity Tolerance Patient tolerated treatment well    Behavior During Therapy [facility] for tasks assessed/performed           Past Medical History:  Diagnosis Date  . Asthma   . Chronic kidney disease   . Hx of pancreatitis 06/16/2018  . Kidney stone   . Kidney stone   . Migraine   . Orthostatic hypotension   . Osteopenia 03/31/2012  . Ulcerative colitis ([place]) 03/31/2012    Past Surgical History:  Procedure Laterality Date  . CESAREAN SECTION     x3  . CHOLECYSTECTOMY    . COLONOSCOPY    . LITHOTRIPSY    . THYROIDECTOMY     1997    There were no vitals filed for this visit.   Subjective Assessment - 10/12/20 1205    Subjective Pt states minimal/no pain with regular activities, bathing, dressing this am, Was able to use flat iron on hair for first time today.    Currently in Pain? Yes    Pain Score 4     Pain Location Shoulder    Pain Orientation Left    Pain Descriptors / Indicators Aching    Pain Type Acute pain    Pain Onset More than a month ago              [facility] PT Assessment - 10/12/20 0001      AROM   Right Shoulder Flexion 120 Degrees    Right Shoulder ABduction 110 Degrees      PROM   Left Shoulder Flexion 135 Degrees    Left Shoulder Internal Rotation 55 Degrees      Strength   Left Shoulder Flexion 4/5    Left Shoulder ABduction 4/5    Left Shoulder Internal Rotation 4/5    Left  Shoulder External Rotation 4/5                         [facility] Adult PT Treatment/Exercise - 10/12/20 1212      Exercises   Exercises Shoulder      Shoulder Exercises: Supine   Flexion AAROM;15 reps    Flexion Limitations cane      Shoulder Exercises: Seated   Other Seated Exercises table ER, ABD, flexion stretch 10s 10x      Shoulder Exercises: Standing   Row 20 reps    Theraband Level (Shoulder Row) Level 3 (Green)      Shoulder Exercises: Pulleys   Flexion 2 minutes    Scaption 1 minute      Shoulder Exercises: Stretch   Corner Stretch 5 reps;20 seconds    Internal Rotation Stretch 5 reps    Internal Rotation Stretch Limitations behind back, 2 hand hold 10  x10 sec ;      Manual Therapy   Manual Therapy Joint mobilization;Soft  tissue mobilization;Passive ROM    Joint Mobilization post and inf GHJ mobs gr 3;    Passive ROM L shoulder, all motions, subscap release                    PT Short Term Goals - 10/12/20 1207      PT SHORT TERM GOAL #1   Title Pt to be independent wtih initial HEP    Time 2    Period Weeks    Status Achieved    Target Date 09/22/20      PT SHORT TERM GOAL #2   Title Pt to demo improved PROM for flexion by at least 15 deg.    Time 2    Period Weeks    Status Achieved    Target Date 09/22/20             PT Long Term Goals - 10/12/20 1207      PT LONG TERM GOAL #1   Title Pt to be independent with final HEP    Time 8    Period Weeks    Status Partially Met      PT LONG TERM GOAL #2   Title Pt to demo improved AROM of L shoulder to be WNL to improve ability for ADLs and IADLS.    Time 8    Period Weeks    Status Partially Met      PT LONG TERM GOAL #3   Title Pt to demo improved strength of L shouder, to be at least 4+/5 to improve ability for reaching, lifting, carrying.    Time 8    Period Weeks    Status Partially Met      PT LONG TERM GOAL #4   Title Pt to report decreased pain in L shoulder  to 0-1/10 with activity.    Time 6    Period Weeks    Status Partially Met                 Plan - 10/12/20 1213    Clinical Impression Statement Pt making improvements, and is progressing towards LTGs. Pt with improved PROM and AROM, as well as pain. She continues to have stiffness in GHJ and tightness in surrounding musculature, that is limiting full ROM for elevation and IR. She has pain and difficlty with behind the back IR. Pt with improving abilty for functional use of UE, reaching behind head and for elevation. Pt to benefit from continuation of skilled PT to improve ROM, pain, and improve ability for pain free reaching, lifting, carryng and IADLS.    Personal Factors and Comorbidities Comorbidity 1;Time since onset of injury/illness/exacerbation;Sex    Examination-Activity Limitations Carry;Lift;Reach Overhead;Dressing    Examination-Participation Restrictions Cleaning;Community Activity;Laundry;Shop    Stability/Clinical Decision Making Stable/Uncomplicated    Rehab Potential Good    PT Frequency 2x / week    PT Duration 6 weeks    PT Treatment/Interventions ADLs/Self Care Home Management;Cryotherapy;Electrical Stimulation;Iontophoresis 16m/ml Dexamethasone;Moist Heat;Therapeutic exercise;Therapeutic activities;Functional mobility training;DME Instruction;Ultrasound;Neuromuscular re-education;Patient/family education;Manual techniques;Vasopneumatic Device;Taping;Energy conservation;Dry needling;Passive range of motion;Spinal Manipulations;Joint Manipulations    PT Home Exercise Plan [id number]   Consulted and Agree with Plan of Care Patient           Patient will benefit from skilled therapeutic intervention in order to improve the following deficits and impairments:  Hypomobility,Decreased activity tolerance,Decreased strength,Pain,Impaired UE functional use,Increased muscle spasms,Decreased mobility,Decreased range of motion,Impaired flexibility  Visit Diagnosis: Acute pain  of left shoulder  Problem List Patient Active Problem List   Diagnosis Date Noted  . Osteoporosis 07/11/2020  . Atherosclerosis of native coronary artery of native heart without angina pectoris 06/08/2019  . Aortic atherosclerosis ([place]) 06/08/2019  . Mixed hyperlipidemia 05/02/2019  . Postural dizziness with presyncope 05/02/2019  . HSV-2 (herpes simplex virus 2) infection 11/04/2018  . Hypothyroidism s/p total thyroidectomy 1997 08/15/2018  . Autoimmune hepatitis ([place]) 08/16/2012  . Asthma 03/31/2012  . Ulcerative colitis ([place]) 03/31/2012  . History of renal calculi 03/31/2012  . Migraines 03/31/2012  . Mitral valve prolapse     Lyndee Hensen, PT, DPT 12:16 PM  10/12/20     [facility] [place], [place], [address] Phone: [phone]   Fax:  [phone]  Name: Ruba Outen MRN: [id number] Date of Birth: 1961-07-10

## 2020-10-17 ENCOUNTER — Encounter: Payer: BC Managed Care – PPO | Admitting: Physical Therapy

## 2020-10-21 ENCOUNTER — Ambulatory Visit (INDEPENDENT_AMBULATORY_CARE_PROVIDER_SITE_OTHER): Payer: BC Managed Care – PPO | Admitting: Physical Therapy

## 2020-10-21 ENCOUNTER — Other Ambulatory Visit: Payer: Self-pay

## 2020-10-21 ENCOUNTER — Encounter: Payer: Self-pay | Admitting: Physical Therapy

## 2020-10-21 DIAGNOSIS — M25512 Pain in left shoulder: Secondary | ICD-10-CM | POA: Diagnosis not present

## 2020-10-21 NOTE — Therapy (Signed)
[facility] [address] [place], [place], [address] Phone: [phone]   Fax:  [phone]  Physical Therapy Treatment  Patient Details  Name: Danielle Bond MRN: [id number] Date of Birth: 1961-07-19 Referring Provider (PT): Bevely Palmer Persons, [place]   Encounter Date: 10/21/2020   PT End of Session - 10/21/20 1023    Visit Number 11    Number of Visits 20    Date for PT Re-Evaluation 11/23/20    Authorization Type BCBS    PT Start Time 1015    PT Stop Time 1055    PT Time Calculation (min) 40 min    Activity Tolerance Patient tolerated treatment well    Behavior During Therapy [facility] for tasks assessed/performed           Past Medical History:  Diagnosis Date  . Asthma   . Chronic kidney disease   . Hx of pancreatitis 06/16/2018  . Kidney stone   . Kidney stone   . Migraine   . Orthostatic hypotension   . Osteopenia 03/31/2012  . Ulcerative colitis ([place]) 03/31/2012    Past Surgical History:  Procedure Laterality Date  . CESAREAN SECTION     x3  . CHOLECYSTECTOMY    . COLONOSCOPY    . LITHOTRIPSY    . THYROIDECTOMY     1997    There were no vitals filed for this visit.   Subjective Assessment - 10/21/20 1018    Subjective Pt states that the arm is moving better. She is able to put her hair up in a bun and is able to reach behind her back much easier. She has not had any more fatigue due to Humira injection.    Currently in Pain? No/denies    Pain Score 0-No pain    Pain Onset More than a month ago                             [facility] Adult PT Treatment/Exercise - 10/21/20 0001      Exercises   Exercises Shoulder      Shoulder Exercises: Supine   Other Supine Exercises ER stretchin 10s 10x arm at 90 deg      Shoulder Exercises: Standing   Row 20 reps    Theraband Level (Shoulder Row) Level 3 (Green)    Other Standing Exercises wall angel 10x (unable to perform)      Shoulder Exercises: Pulleys    Flexion 2 minutes    Scaption 1 minute      Shoulder Exercises: Stretch   Corner Stretch 5 reps;20 seconds    Internal Rotation Stretch --    Internal Rotation Stretch Limitations --    Other Shoulder Stretches childs pose 30 sec x 3, L, R center; kneeling lat stretch 10s 10x    Other Shoulder Stretches Wall slides  10s 10x.      Manual Therapy   Manual Therapy Joint mobilization;Soft tissue mobilization;Passive ROM    Joint Mobilization post and inf GHJ mobs grade III    Soft tissue mobilization subscap, UT, infra    Passive ROM --                  PT Education - 10/21/20 1022    Education Details HEP update, thermotherapy, exercise progression    Person(s) Educated Patient    Methods Explanation;Demonstration;Tactile cues;Verbal cues    Comprehension Verbalized understanding;Returned demonstration  PT Short Term Goals - 10/12/20 1207      PT SHORT TERM GOAL #1   Title Pt to be independent wtih initial HEP    Time 2    Period Weeks    Status Achieved    Target Date 09/22/20      PT SHORT TERM GOAL #2   Title Pt to demo improved PROM for flexion by at least 15 deg.    Time 2    Period Weeks    Status Achieved    Target Date 09/22/20             PT Long Term Goals - 10/12/20 1207      PT LONG TERM GOAL #1   Title Pt to be independent with final HEP    Time 8    Period Weeks    Status Partially Met      PT LONG TERM GOAL #2   Title Pt to demo improved AROM of L shoulder to be WNL to improve ability for ADLs and IADLS.    Time 8    Period Weeks    Status Partially Met      PT LONG TERM GOAL #3   Title Pt to demo improved strength of L shouder, to be at least 4+/5 to improve ability for reaching, lifting, carrying.    Time 8    Period Weeks    Status Partially Met      PT LONG TERM GOAL #4   Title Pt to report decreased pain in L shoulder to 0-1/10 with activity.    Time 6    Period Weeks    Status Partially Met                  Plan - 10/21/20 1057    Clinical Impression Statement Pt demonstrated improvement with ER bheind head and IR behind back reach after manual therapy and stretching. Pt was able to tolerate progressive stretching of shoulder internal rotators without increased pain. Pt felt increased relief of OH ER behind reaching motions after manual therapy and exercise. Pt was not able to tolerate wall angel exercise due to intolerable level of stretching intensity. Pt would benefit from continued skilled therapy in order maximize functional L UE ROM and strength for return to PLOF.    Personal Factors and Comorbidities Comorbidity 1;Time since onset of injury/illness/exacerbation;Sex    Examination-Activity Limitations Carry;Lift;Reach Overhead;Dressing    Examination-Participation Restrictions Cleaning;Community Activity;Laundry;Shop    Stability/Clinical Decision Making Stable/Uncomplicated    Rehab Potential Good    PT Frequency 2x / week    PT Duration 6 weeks    PT Treatment/Interventions ADLs/Self Care Home Management;Cryotherapy;Electrical Stimulation;Iontophoresis 52m/ml Dexamethasone;Moist Heat;Therapeutic exercise;Therapeutic activities;Functional mobility training;DME Instruction;Ultrasound;Neuromuscular re-education;Patient/family education;Manual techniques;Vasopneumatic Device;Taping;Energy conservation;Dry needling;Passive range of motion;Spinal Manipulations;Joint Manipulations    PT Home Exercise Plan [email or web address]   Consulted and Agree with Plan of Care Patient           Patient will benefit from skilled therapeutic intervention in order to improve the following deficits and impairments:  Hypomobility,Decreased activity tolerance,Decreased strength,Pain,Impaired UE functional use,Increased muscle spasms,Decreased mobility,Decreased range of motion,Impaired flexibility  Visit Diagnosis: Acute pain of left shoulder     Problem List Patient Active Problem List   Diagnosis  Date Noted  . Osteoporosis 07/11/2020  . Atherosclerosis of native coronary artery of native heart without angina pectoris 06/08/2019  . Aortic atherosclerosis ([place] 06/08/2019  . Mixed hyperlipidemia 05/02/2019  . Postural dizziness with presyncope 05/02/2019  .  HSV-2 (herpes simplex virus 2) infection 11/04/2018  . Hypothyroidism s/p total thyroidectomy 1997 08/15/2018  . Autoimmune hepatitis ([place]) 08/16/2012  . Asthma 03/31/2012  . Ulcerative colitis ([place]) 03/31/2012  . History of renal calculi 03/31/2012  . Migraines 03/31/2012  . Mitral valve prolapse     Daleen Bo PT, DPT 10/21/20 11:53 AM   [facility] [address] [place], [place], [address] Phone: [phone]   Fax:  [phone]  Name: Danielle Bond MRN: [id number] Date of Birth: 09-Oct-1960

## 2020-10-24 ENCOUNTER — Other Ambulatory Visit: Payer: Self-pay

## 2020-10-24 ENCOUNTER — Ambulatory Visit (INDEPENDENT_AMBULATORY_CARE_PROVIDER_SITE_OTHER): Payer: BC Managed Care – PPO | Admitting: Physical Therapy

## 2020-10-24 ENCOUNTER — Encounter: Payer: Self-pay | Admitting: Physical Therapy

## 2020-10-24 DIAGNOSIS — M25512 Pain in left shoulder: Secondary | ICD-10-CM

## 2020-10-24 NOTE — Therapy (Addendum)
[facility] [address] [place], [place], [address] Phone: [phone]   Fax:  [phone]  Physical Therapy Discharge  Patient Details  Name: Danielle Bond MRN: [id number] Date of Birth: June 08, 1961 Referring Provider (PT): Bevely Palmer Persons, [place]   Encounter Date: 10/24/2020   PT End of Session - 10/24/20 1049    Visit Number 12    Number of Visits 20    Date for PT Re-Evaluation 11/23/20    Authorization Type [employer]    PT Start Time 1020    PT Stop Time 1100    PT Time Calculation (min) 40 min    Activity Tolerance Patient tolerated treatment well;Patient limited by pain    Behavior During Therapy [facility] for tasks assessed/performed           Past Medical History:  Diagnosis Date  . Asthma   . Chronic kidney disease   . Hx of pancreatitis 06/16/2018  . Kidney stone   . Kidney stone   . Migraine   . Orthostatic hypotension   . Osteopenia 03/31/2012  . Ulcerative colitis ([place]) 03/31/2012    Past Surgical History:  Procedure Laterality Date  . CESAREAN SECTION     x3  . CHOLECYSTECTOMY    . COLONOSCOPY    . LITHOTRIPSY    . THYROIDECTOMY     1997    There were no vitals filed for this visit.   Subjective Assessment - 10/24/20 1024    Subjective Pt states that her L arm around the deltoid is "excruciating" today. She states she did not do anything differently. She reports doing some power walking and some gardening but nothing strenuous. She states she did sleep on her stomach with the L arm in IR and by her side for the night.    Currently in Pain? Yes    Pain Score 8     Pain Location Shoulder    Pain Orientation Left    Pain Descriptors / Indicators Sharp    Pain Type Acute pain    Pain Onset More than a month ago    Aggravating Factors  sleeping on that side, reaching, dressing, ADL    Pain Relieving Factors Biofreeze                           [facility] Adult PT Treatment/Exercise - 10/24/20 0001       Exercises   Exercises Shoulder      Shoulder Exercises: Seated   Other Seated Exercises table ER 10s 10x;  table flexion 10s 10x;  unable to tolerate stool ABD      Shoulder Exercises: Stretch   Corner Stretch 5 reps;20 seconds    Corner Stretch Limitations arm at 60 deg    Other Shoulder Stretches Wall slides  10s 10x     Manual Therapy   Manual Therapy Joint mobilization;Soft tissue mobilization;Passive ROM    Joint Mobilization post and inf GHJ mobs grade III    Soft tissue mobilization subscap, UT, infra, deltoid, biceps                  PT Education - 10/24/20 1048    Education Details HEP update, sleeping positoin, exercise progression    Person(s) Educated Patient    Methods Explanation;Demonstration;Tactile cues;Verbal cues    Comprehension Verbalized understanding;Returned demonstration            PT Short Term Goals - 10/12/20 1207  PT SHORT TERM GOAL #1   Title Pt to be independent wtih initial HEP    Time 2    Period Weeks    Status Achieved    Target Date 09/22/20      PT SHORT TERM GOAL #2   Title Pt to demo improved PROM for flexion by at least 15 deg.    Time 2    Period Weeks    Status Achieved    Target Date 09/22/20             PT Long Term Goals - 10/12/20 1207      PT LONG TERM GOAL #1   Title Pt to be independent with final HEP    Time 8    Period Weeks    Status Partially Met      PT LONG TERM GOAL #2   Title Pt to demo improved AROM of L shoulder to be WNL to improve ability for ADLs and IADLS.    Time 8    Period Weeks    Status Partially Met      PT LONG TERM GOAL #3   Title Pt to demo improved strength of L shouder, to be at least 4+/5 to improve ability for reaching, lifting, carrying.    Time 8    Period Weeks    Status Partially Met      PT LONG TERM GOAL #4   Title Pt to report decreased pain in L shoulder to 0-1/10 with activity.    Time 6    Period Weeks    Status Partially Met                  Plan - 10/24/20 1049    Clinical Impression Statement Pt demonstrates signficantly increased L deltoid hypertonicity causing pain. Pt did have decreased muscle tension after STM and joint mob but it continued to linger and inhibit motion with exercise. Pt was limited by pain today and showed tetany with stretching exercise. Pt responsed best to shoulder extension and low doorway pec stretching Pt was able to mildly increased L shoulder ROM but was unable to tolerate more aggressive stretching like previous session. Progress ROM as tolerated. Pt would benefit from continued skilled therapy in order to reach goals and maximize functional L UE strength and ROM for full return to PLOF.    Personal Factors and Comorbidities Comorbidity 1;Time since onset of injury/illness/exacerbation;Sex    Examination-Activity Limitations Carry;Lift;Reach Overhead;Dressing    Examination-Participation Restrictions Cleaning;Community Activity;Laundry;Shop    Stability/Clinical Decision Making Stable/Uncomplicated    Rehab Potential Good    PT Frequency 2x / week    PT Duration 6 weeks    PT Treatment/Interventions ADLs/Self Care Home Management;Cryotherapy;Electrical Stimulation;Iontophoresis 34m/ml Dexamethasone;Moist Heat;Therapeutic exercise;Therapeutic activities;Functional mobility training;DME Instruction;Ultrasound;Neuromuscular re-education;Patient/family education;Manual techniques;Vasopneumatic Device;Taping;Energy conservation;Dry needling;Passive range of motion;Spinal Manipulations;Joint Manipulations    PT Home Exercise Plan [id number]   Consulted and Agree with Plan of Care Patient           Patient will benefit from skilled therapeutic intervention in order to improve the following deficits and impairments:  Hypomobility,Decreased activity tolerance,Decreased strength,Pain,Impaired UE functional use,Increased muscle spasms,Decreased mobility,Decreased range of motion,Impaired  flexibility  Visit Diagnosis: Acute pain of left shoulder     Problem List Patient Active Problem List   Diagnosis Date Noted  . Osteoporosis 07/11/2020  . Atherosclerosis of native coronary artery of native heart without angina pectoris 06/08/2019  . Aortic atherosclerosis ([place] 06/08/2019  . Mixed hyperlipidemia 05/02/2019  .  Postural dizziness with presyncope 05/02/2019  . HSV-2 (herpes simplex virus 2) infection 11/04/2018  . Hypothyroidism s/p total thyroidectomy 1997 08/15/2018  . Autoimmune hepatitis ([place]) 08/16/2012  . Asthma 03/31/2012  . Ulcerative colitis ([place]) 03/31/2012  . History of renal calculi 03/31/2012  . Migraines 03/31/2012  . Mitral valve prolapse     Daleen Bo PT, DPT 10/24/20 12:19 PM   [facility] [address] [place], [place], [address] Phone: [phone]   Fax:  [phone]  Name: Delailah Spieth MRN: [id number] Date of Birth: 1961-06-11   PHYSICAL THERAPY DISCHARGE SUMMARY  Visits from Start of Care: 12   Plan:                                                    Patient goals were not met. Patient is being discharged due to not returning since the last visit.  ?????

## 2020-10-26 ENCOUNTER — Encounter: Payer: BC Managed Care – PPO | Admitting: Physical Therapy

## 2020-10-28 ENCOUNTER — Other Ambulatory Visit: Payer: Self-pay

## 2020-10-28 ENCOUNTER — Ambulatory Visit (INDEPENDENT_AMBULATORY_CARE_PROVIDER_SITE_OTHER): Payer: BC Managed Care – PPO | Admitting: Allergy

## 2020-10-28 ENCOUNTER — Encounter: Payer: Self-pay | Admitting: Allergy

## 2020-10-28 VITALS — BP 132/80 | HR 90 | Temp 98.1°F | Resp 18 | Ht 60.0 in | Wt 153.4 lb

## 2020-10-28 DIAGNOSIS — J4540 Moderate persistent asthma, uncomplicated: Secondary | ICD-10-CM | POA: Diagnosis not present

## 2020-10-28 DIAGNOSIS — J3089 Other allergic rhinitis: Secondary | ICD-10-CM | POA: Diagnosis not present

## 2020-10-28 DIAGNOSIS — H1013 Acute atopic conjunctivitis, bilateral: Secondary | ICD-10-CM | POA: Diagnosis not present

## 2020-10-28 DIAGNOSIS — L500 Allergic urticaria: Secondary | ICD-10-CM

## 2020-10-28 DIAGNOSIS — R053 Chronic cough: Secondary | ICD-10-CM

## 2020-10-28 NOTE — Patient Instructions (Addendum)
Asthma Chronic cough -appears allergen driven Oral allergy syndrome Allergic urticaria Food intolerance  -Continue Symbicort 1 to 2 inhalations twice a day -have access to albuterol inhaler 2 puffs every 4-6 hours as needed for cough/wheeze/shortness of breath/chest tightness.  May use 15-20 minutes prior to activity.   Monitor frequency of use.   -Discussed today if her asthma becomes less controlled with her inhaler regimen then she may benefit from a biologic asthma agent.  I discussed with her the options of biologic agents if we cannot control her asthma with inhaler regimen.  We will need to obtain an updated CBC with differential to assess her degree of eosinophilia to see what all she may qualify for.  In the past it looks like she may have qualified the antieosinophilic biologic asthma agents based on eosinophil counts from 2019.  However she would qualify for Xolair as well as Te[place]sp[place] at this time without even knowing what her eosinophils are.  advised to discuss this further if we need to potentially initiate a biologic agent.  Asthma control goals:   Full participation in all desired activities (may need albuterol before activity)  Albuterol use two time or less a week on average (not counting use with activity)  Cough interfering with sleep two time or less a month  Oral steroids no more than once a year  No hospitalizations  -She should perform allergen avoidance measures for cat dander, dog dander, tree pollen, grass pollen, weed pollen -Continue Zyrtec 10 mg daily as needed for general allergy symptom control -Can continue use of your homeopathic eyedrop.  If this becomes less effective can try over-the-counter Pataday 1 drop each eye daily as needed -Continue Flonase 2 sprays each nostril daily for 1-2 weeks at a time before stopping once nasal congestion improves for maximum benefit -Advised if her allergy medication regimen is not effective in controlling her symptoms  then she would be eligible for allergen immunotherapy that was discussed today including protocol, benefits and risks.  - The oral allergy syndrome (OAS) or pollen-food allergy syndrome (PFAS) is a relatively common form of food allergy, particularly in adults. It typically occurs in people who have pollen allergies when the immune system "sees" proteins on the food that look like proteins on the pollen. This results in the allergy antibody (IgE) binding to the food instead of the pollen. Patients typically report itching and/or mild swelling of the mouth and throat immediately following ingestion of certain uncooked fruits (including nuts) or raw vegetables. Only a very small number of affected individuals experience systemic allergic reactions, such as anaphylaxis which occurs with true food allergies.   -This is what is occurring with apples    - we have discussed the following in regards to foods:   Allergy: food allergy is when you have eaten a food, developed an allergic reaction after eating the food and have IgE to the food (positive food testing either by skin testing or blood testing).  Food allergy could lead to life threatening symptoms  Sensitivity: occurs when you have IgE to a food (positive food testing either by skin testing or blood testing) but is a food you eat without any issues.  This is not an allergy and we recommend keeping the food in the diet  Intolerance: this is when you have negative testing by either skin testing or blood testing thus not allergic but the food causes symptoms (like belly pain, bloating, diarrhea etc) with ingestion.  These foods should be avoided to  prevent symptoms.   -Advised to keep a food journal to document foods that cause symptoms after ingestion.  Advised that the specific foods could be tested for to see if she has IgE and possibly a true allergy versus an intolerance. -Discussed that the [place] food sensitivity test test for IgG which is not  the allergy antibody and thus does not let us know 1 way or another if she has an allergy to these foods.  I did recommend that she not use this testing to help guide her diet.  Follow-up in 6 months or sooner if needed

## 2020-10-28 NOTE — Progress Notes (Signed)
New Patient Note  RE: Danielle Bond MRN: [id number] DOB: 1961/04/30 Date of Office Visit: 10/28/2020  Referring provider: [name], MD Primary care provider: [name], MD  Chief Complaint: Asthma and allergies  History of present illness: Danielle Bond is a 60 y.o. female presenting today for consultation for cough.  She states in November 2019 she got very sick and states she lost 15lb in 2 weeks, had fevers, chills, bronchitis, loss sense of taste.  She states she was sick for about a month.  Looking back it is now presumed she likely had Covid illness.  She states she has seen infectious disease, rheumatology, pulmonology since she has had onset of these symptoms as she continued to have fevers and symptoms for longer than a months time.  She states she has continued to have a dry cough that was diagnosed as neuropathic cough by pulmonology.  She states however the cough has been episodic occurring from Nov-Dec and stopped and returns around March-May since the onset of her original symptoms as above.  Since the cough does appear to be seasonal it was recommended she see an allergist.   She states she was diagnosed with asthma when she had her first child.  She states her asthma is seasonal and emotionally triggered.  She states she doesn't use albuterol inhaler much at all but does take Symbicort 1 puff in the morning and night.  She denies needing ED or urgent care visits for asthma and has not received any systemic steroids asthma exacerbations.  She does report watery eyes, itchy eyes, ear itch as well as nasal congestion.  She states she did have seasonal allergies as a child.  She has tried Claritin and Zyrtec and states Zyrtec does work better than claritin.    Around spring she does develop hive on her hands and feet as well.    She did do the [facility] food sensitivity testing (IgG testing) which returned positive to a variety of different food items and states  she did the test when she was very sick as she did not know what was causing her illness.  She has also colitis that she states she does try to eat more healthy foods like vegetables.  Tries to stay away from sugary foods.  There are certain foods that she notes some issues with after eating.  She states with ginger she gets severe heartburn.  When she eats eggs and is more runny form she develops diarrhea (mostly if the egg is runny or boiled).  But she can eat scrambled egg without issue. fresh Apples causes her tongue to tingle and itch but is able to eat cooked apples without any issue.    She does have history of UC.    She is not interested in performing skin testing at today's visit.  Review of systems: Review of Systems  Constitutional: Negative.   HENT: Positive for congestion.   Eyes:       See HPI  Respiratory: Positive for cough.   Cardiovascular: Negative.   Gastrointestinal: Negative.   Musculoskeletal: Negative.   Skin: Positive for itching and rash.  Neurological: Negative.     All other systems negative unless noted above in HPI  Past medical history: Past Medical History:  Diagnosis Date  . Asthma   . Chronic kidney disease   . Hx of pancreatitis 06/16/2018  . Kidney stone   . Kidney stone   . Migraine   . Orthostatic hypotension   .  Osteopenia 03/31/2012  . Recurrent upper respiratory infection (URI)   . Ulcerative colitis ([place]) 03/31/2012  . Urticaria     Past surgical history: Past Surgical History:  Procedure Laterality Date  . CESAREAN SECTION     x3  . CHOLECYSTECTOMY    . COLONOSCOPY    . LITHOTRIPSY    . THYROIDECTOMY     1997    Family history:  Family History  Problem Relation Age of Onset  . Cervical cancer Paternal Aunt   . Heart disease Paternal Grandmother   . Heart disease Mother   . Dementia Mother   . Thyroid disease Mother   . Osteoarthritis Mother   . Asthma Mother   . Heart attack Father   . Breast cancer Cousin   .  Heart attack Brother   . Heart disease Brother     Social history: She lives in a home with new carpeting in the family room with electric heating and central cooling.  There is a dog in the home.  There is no concern for water damage, mildew or roaches in the home.  She is a Environmental health practitioner.  Denies a smoking history.  Medication List: Current Outpatient Medications  Medication Sig Dispense Refill  . albuterol (VENTOLIN HFA) 108 (90 Base) MCG/ACT inhaler TAKE 2 PUFFS BY MOUTH EVERY 6 HOURS AS NEEDED FOR WHEEZE OR SHORTNESS OF BREATH 25.5 each 1  . budesonide-formoterol (SYMBICORT) 160-4.5 MCG/ACT inhaler Inhale 2 puffs into the lungs in the morning and at bedtime. 1 each 12  . cholecalciferol (VITAMIN D3) 25 MCG (1000 UNIT) tablet Take 50,000 Units by mouth.    . EPINEPHrine 0.3 mg/0.3 mL IJ SOAJ injection Inject 0.3 mLs (0.3 mg total) into the muscle as needed for anaphylaxis. 1 each 1  . fluticasone (FLONASE) 50 MCG/ACT nasal spray PLACE 2 SPRAYS INTO BOTH NOSTRILS DAILY AS NEEDED FOR ALLERGIES OR RHINITIS. 48 mL 1  . folic acid (FOLVITE) 1 MG tablet Take 1 mg by mouth daily.    Marland Kitchen HUMIRA PEN 40 MG/0.4ML PNKT every 14 (fourteen) days.     . Multiple Vitamin (MULTIVITAMIN WITH MINERALS) TABS tablet Take 1 tablet by mouth daily.    . Multiple Vitamins-Minerals (GNP HAIR/SKIN/NAILS PO) Take by mouth.    . pantoprazole (PROTONIX) 40 MG tablet Take 40 mg by mouth daily.    . rosuvastatin (CRESTOR) 10 MG tablet TAKE 1 TABLET BY MOUTH EVERY DAY 90 tablet 3  . vitamin C (ASCORBIC ACID) 500 MG tablet Take 500 mg by mouth daily.    Marland Kitchen ELDERBERRY PO Take 1 tablet by mouth daily. (Patient not taking: Reported on 10/28/2020)    . levothyroxine (SYNTHROID, LEVOTHROID) 112 MCG tablet Take 112 mcg by mouth daily before breakfast. (Patient not taking: Reported on 10/28/2020)    . predniSONE (DELTASONE) 10 MG tablet Take 1 tablet (10 mg total) by mouth daily with breakfast. (Patient not taking:  Reported on 10/28/2020) 28 tablet 0  . Riboflavin 100 MG CAPS Take by mouth. (Patient not taking: Reported on 10/28/2020)     No current facility-administered medications for this visit.    Known medication allergies: Allergies  Allergen Reactions  . Almond Oil Hives and Shortness Of Breath  . Lialda [Mesalamine] Other (See Comments)    Vomiting and rectal bleeding  . Other Anaphylaxis    Almonds, tree nuts  . Aspirin Hives and Nausea Only    Stomach cramps  . Hydromorphone Hcl Other (See Comments)    Caused Enzyme  levels to go up  . Penicillins Hives and Swelling    Has patient had a PCN reaction causing immediate rash, facial/tongue/throat swelling, SOB or lightheadedness with hypotension: Yes Has patient had a PCN reaction causing severe rash involving mucus membranes or skin necrosis: No Has patient had a PCN reaction that required hospitalization: No Has patient had a PCN reaction occurring within the last 10 years: No  Stomach cramps If all of the above answers are "NO", then may proceed with Cephalosporin use.   . Acetaminophen Other (See Comments)    Has autoimmune hepatitis, wants to avoid APAP  . Apriso [Mesalamine Er] Nausea And Vomiting  . Corticosteroids Other (See Comments)    Had acute pancreatitis x 2 after steroid use.  . Imitrex [Sumatriptan] Swelling    Injection caused swelling  . Stadol [Butorphanol] Other (See Comments)    hallucinates  . Topamax [Topiramate] Other (See Comments)    Kidney stones  . Vancomycin Hives  . Azathioprine Rash  . Pepcid [Famotidine] Nausea And Vomiting    Makes acid worse  . Prednisone Nausea And Vomiting    All steroids causes nausea and pancreatitis      Physical examination: Blood pressure 132/80, pulse 90, temperature 98.1 F (36.7 C), resp. rate 18, height 5' (1.524 m), weight 153 lb 6.4 oz (69.6 kg), last menstrual period 03/03/2013, SpO2 97 %.  General: Alert, interactive, in no acute distress. HEENT: PERRLA, TMs  pearly gray, turbinates minimally edematous without discharge, post-pharynx non erythematous. Neck: Supple without lymphadenopathy. Lungs: Clear to auscultation without wheezing, rhonchi or rales. {no increased work of breathing. CV: Normal S1, S2 without murmurs. Abdomen: Nondistended, nontender. Skin: Warm and dry, without lesions or rashes. Extremities:  No clubbing, cyanosis or edema. Neuro:   Grossly intact.  Diagnositics/Labs: Labs:  Component     Latest Ref Rng & Units 12/17/2019        10:45 AM  Allergen, D pternoyssinus,d7     kU/L <0.10  Class      0  D. farinae     kU/L <0.10  Allergen, P. notatum, m1     kU/L <0.10  CLADOSPORIUM HERBARUM (M2) IGE     kU/L <0.10  Aspergillus fumigatus, m3     kU/L <0.10  Allergen, A. alternata, m6     kU/L <0.10  Cat Dander     kU/L 2.89 (H)  Dog Dander     kU/L 22.00 (H)  Cockroach     kU/L <0.10  Box Elder IgE     kU/L <0.10  Allergen, Comm Silver Wendee Copp, t9     kU/L 1.71 (H)  Allergen, Cedar tree, t12     kU/L <0.10  Allergen, Cottonwood, t14     kU/L <0.10  Allergen, Oak,t7     kU/L 0.88 (H)  Elm IgE     kU/L <0.10  Pecan/Hickory Tree IgE     kU/L <0.10  Allergen, Mulberry, t76     kU/L <0.10  [place] Grass     kU/L <0.10  Timothy Grass     kU/L 0.16 (H)  Johnson Grass     kU/L <0.10  COMMON RAGWEED (SHORT) (W1) IGE     kU/L 0.87 (H)  Rough Pigweed  IgE     kU/L <0.10  Sheep Sorrel IgE     kU/L <0.10  Allergen, Mouse Urine Protein, e78     kU/L <0.10  IgE (Immunoglobulin E), Serum     <OR=114 kU/L 158 (H)   Component  Latest Ref Rng & Units 07/11/2020  Glucose     65 - 99 mg/dL 111 (H)  BUN     7 - 25 mg/dL 13  Creatinine     0.50 - 1.05 mg/dL 0.87  BUN/Creatinine Ratio     6 - 22 (calc) NOT APPLICABLE  Sodium     568 - 146 mmol/L 139  Potassium     3.5 - 5.3 mmol/L 4.6  Chloride     98 - 110 mmol/L 105  CO2     20 - 32 mmol/L 27  Calcium     8.6 - 10.4 mg/dL 9.7  Total Protein      6.1 - 8.1 g/dL 8.0  Albumin MSPROF     3.6 - 5.1 g/dL 4.5  Globulin     1.9 - 3.7 g/dL (calc) 3.5  AG Ratio     1.0 - 2.5 (calc) 1.3  Total Bilirubin     0.2 - 1.2 mg/dL 0.5  Alkaline phosphatase (APISO)     37 - 153 U/L 59  AST     10 - 35 U/L 31  ALT     6 - 29 U/L 20  WBC     3.8 - 10.8 Thousand/uL 6.7  RBC     3.80 - 5.10 Million/uL 4.26  Hemoglobin     11.7 - 15.5 g/dL 12.4  HCT     35.0 - 45.0 % 38.1  MCV     80.0 - 100.0 fL 89.4  MCH     27.0 - 33.0 pg 29.1  MCHC     32.0 - 36.0 g/dL 32.5  RDW     11.0 - 15.0 % 12.0  Platelets     140 - 400 Thousand/uL 303  MPV     7.5 - 12.5 fL 9.9   Review pulmonary function testing done by pulmonology from May 2021  Assessment and plan:   Asthma Chronic cough -appears allergen driven Oral allergy syndrome Allergic urticaria Food intolerance  -Continue Symbicort 1 to 2 inhalations twice a day -have access to albuterol inhaler 2 puffs every 4-6 hours as needed for cough/wheeze/shortness of breath/chest tightness.  May use 15-20 minutes prior to activity.   Monitor frequency of use.   -Discussed today if her asthma becomes less controlled with her inhaler regimen then she may benefit from a biologic asthma agent.  I discussed with her the options of biologic agents if we cannot control her asthma with inhaler regimen.  We will need to obtain an updated CBC with differential to assess her degree of eosinophilia.   In the past it looks like she may have qualified the antieosinophilic biologic asthma agents based on eosinophil counts from 2019.  However she would qualify for Xolair as well as [place]sp[place] at this time without even knowing what her eosinophils are.   Advised to discuss this further if we need to potentially initiate a biologic agent.  Asthma control goals:   Full participation in all desired activities (may need albuterol before activity)  Albuterol use two time or less a week on average (not counting use with  activity)  Cough interfering with sleep two time or less a month  Oral steroids no more than once a year  No hospitalizations  -She should perform allergen avoidance measures for cat dander, dog dander, tree pollen, grass pollen, weed pollen -Continue Zyrtec 10 mg daily as needed for general allergy symptom control -Can continue use of your homeopathic eyedrop.  If this becomes less effective can try over-the-counter Pataday 1 drop each eye daily as needed -Continue Flonase 2 sprays each nostril daily for 1-2 weeks at a time before stopping once nasal congestion improves for maximum benefit -Advised if her allergy medication regimen is not effective in controlling her symptoms then she would be eligible for allergen immunotherapy that was discussed today including protocol, benefits and risks.  - The oral allergy syndrome (OAS) or pollen-food allergy syndrome (PFAS) is a relatively common form of food allergy, particularly in adults. It typically occurs in people who have pollen allergies when the immune system "sees" proteins on the food that look like proteins on the pollen. This results in the allergy antibody (IgE) binding to the food instead of the pollen. Patients typically report itching and/or mild swelling of the mouth and throat immediately following ingestion of certain uncooked fruits (including nuts) or raw vegetables. Only a very small number of affected individuals experience systemic allergic reactions, such as anaphylaxis which occurs with true food allergies.   -This is what is occurring with apples  - we have discussed the following in regards to foods:   Allergy: food allergy is when you have eaten a food, developed an allergic reaction after eating the food and have IgE to the food (positive food testing either by skin testing or blood testing).  Food allergy could lead to life threatening symptoms  Sensitivity: occurs when you have IgE to a food (positive food testing either  by skin testing or blood testing) but is a food you eat without any issues.  This is not an allergy and we recommend keeping the food in the diet  Intolerance: this is when you have negative testing by either skin testing or blood testing thus not allergic but the food causes symptoms (like belly pain, bloating, diarrhea etc) with ingestion.  These foods should be avoided to prevent symptoms.   -Advised to keep a food journal to document foods that cause symptoms after ingestion.  Advised that the specific foods could be tested for to see if she has IgE and possibly a true allergy versus an intolerance. -Discussed that the [facility] food sensitivity test test for IgG which is not the allergy antibody and thus does not let us know 1 way or another if she has an allergy to these foods.  I did recommend that she not use this testing to help guide her diet.  Follow-up in 6 months or sooner if needed Visit duration from 1:20 PM to 2:25 PM.  Greater than 50% of this time was spent in counseling and education.    I appreciate the opportunity to take part in Crickett's care. Please do not hesitate to contact me with questions.  Sincerely,   Prudy Feeler, MD Allergy/Immunology [facility]

## 2020-11-02 DIAGNOSIS — E119 Type 2 diabetes mellitus without complications: Secondary | ICD-10-CM | POA: Diagnosis not present

## 2020-11-02 DIAGNOSIS — E559 Vitamin D deficiency, unspecified: Secondary | ICD-10-CM | POA: Diagnosis not present

## 2020-11-02 DIAGNOSIS — E785 Hyperlipidemia, unspecified: Secondary | ICD-10-CM | POA: Diagnosis not present

## 2020-11-02 DIAGNOSIS — M1990 Unspecified osteoarthritis, unspecified site: Secondary | ICD-10-CM | POA: Diagnosis not present

## 2020-11-08 DIAGNOSIS — M47817 Spondylosis without myelopathy or radiculopathy, lumbosacral region: Secondary | ICD-10-CM | POA: Diagnosis not present

## 2020-11-08 DIAGNOSIS — M47816 Spondylosis without myelopathy or radiculopathy, lumbar region: Secondary | ICD-10-CM | POA: Diagnosis not present

## 2020-11-08 DIAGNOSIS — M5137 Other intervertebral disc degeneration, lumbosacral region: Secondary | ICD-10-CM | POA: Diagnosis not present

## 2020-11-08 DIAGNOSIS — M5136 Other intervertebral disc degeneration, lumbar region: Secondary | ICD-10-CM | POA: Diagnosis not present

## 2020-11-18 DIAGNOSIS — Z8719 Personal history of other diseases of the digestive system: Secondary | ICD-10-CM | POA: Diagnosis not present

## 2020-12-01 ENCOUNTER — Ambulatory Visit (INDEPENDENT_AMBULATORY_CARE_PROVIDER_SITE_OTHER): Payer: BC Managed Care – PPO | Admitting: Orthopedic Surgery

## 2020-12-01 DIAGNOSIS — M5440 Lumbago with sciatica, unspecified side: Secondary | ICD-10-CM

## 2020-12-02 ENCOUNTER — Encounter: Payer: Self-pay | Admitting: Orthopedic Surgery

## 2020-12-02 NOTE — Progress Notes (Signed)
Office Visit Note   Patient: Danielle Bond           Date of Birth: [birth year]           MRN: [id number] Visit Date: 12/01/2020              Requested by: [name], MD [address] [place],  [place] [address] PCP: [name], MD  Chief Complaint  Patient presents with  . Lower Back - Follow-up    MRI Lspine review       HPI: Patient is a 60 year old woman with right paraspinous muscle pain which she describes as excruciating.  She is currently going to physical therapy for her shoulder at [facility]. Assessment & Plan: Visit Diagnoses:  1. Acute right-sided low back pain with sciatica, sciatica laterality unspecified     Plan: Patient is currently going to physical therapy at [facility].  We will set her up for physical therapy for her lumbar spine at [facility] as well she was given a prescription for this.  There is no focal lumbar pathology you do not feel that a epidural steroid injection would be beneficial.  Follow-Up Instructions: Return if symptoms worsen or fail to improve.   Ortho Exam  Patient is alert, oriented, no adenopathy, well-dressed, normal affect, normal respiratory effort. Examination patient has pain in the right paraspinous region of her lumbar spine.  There are no radicular symptoms below her buttocks.  No focal motor weakness.  Patient states she is currently going to the low [place] physical therapy.  She has tried 5 mg of prednisone and noticed a small improvement she has tried heat and ice.  Review of the MRI scan from [facility] shows normal vertebral bodies with no compression fractures normal alignment no marrow signal abnormalities and mild disc bulging throughout the lumbar spine without herniations or spinal stenosis or foraminal stenosis.    Imaging: No results found. No images are attached to the encounter.  Labs: Lab Results  Component Value Date   ESRSEDRATE 64 (H) 07/09/2019   ESRSEDRATE 50 (H) 06/26/2018   ESRSEDRATE 25 (H)  03/16/2014   CRP 85.3 (H) 06/26/2018   REPTSTATUS 03/12/2014 FINAL 03/10/2014   CULT  03/10/2014    Multiple bacterial morphotypes present, none predominant. Suggest appropriate recollection if clinically indicated. Performed at [place] 03/22/2014     Lab Results  Component Value Date   ALBUMIN 3.8 05/29/2019   ALBUMIN 3.9 06/26/2018   ALBUMIN 4.1 05/23/2018    Lab Results  Component Value Date   MG 1.6 08/15/2012   No results found for: VD25OH  No results found for: PREALBUMIN CBC EXTENDED Latest Ref Rng & Units 07/11/2020 05/29/2019 04/16/2019  WBC 3.8 - 10.8 Thousand/uL 6.7 6.7 5.6  RBC 3.80 - 5.10 Million/uL 4.26 4.45 4.43  HGB 11.7 - 15.5 g/dL 12.4 13.0 12.9  HCT 35.0 - 45.0 % 38.1 39.0 38.9  PLT 140 - 400 Thousand/uL 303 329.0 293.0  NEUTROABS 1.4 - 7.7 K/uL - 4.6 3.6  LYMPHSABS 0.7 - 4.0 K/uL - 1.6 1.6     There is no height or weight on file to calculate BMI.  Orders:  No orders of the defined types were placed in this encounter.  No orders of the defined types were placed in this encounter.    Procedures: No procedures performed  Clinical Data: No additional findings.  ROS:  All other systems negative, except as noted in the  HPI. Review of Systems  Objective: Vital Signs: LMP 03/03/2013   Specialty Comments:  No specialty comments available.  PMFS History: Patient Active Problem List   Diagnosis Date Noted  . Osteoporosis 07/11/2020  . Atherosclerosis of native coronary artery of native heart without angina pectoris 06/08/2019  . Aortic atherosclerosis ([place]) 06/08/2019  . Mixed hyperlipidemia 05/02/2019  . Postural dizziness with presyncope 05/02/2019  . HSV-2 (herpes simplex virus 2) infection 11/04/2018  . Hypothyroidism s/p total thyroidectomy 1997 08/15/2018  . Autoimmune hepatitis ([place]) 08/16/2012  . Asthma 03/31/2012  . Ulcerative colitis ([place]) 03/31/2012  . History of renal calculi 03/31/2012  .  Migraines 03/31/2012  . Mitral valve prolapse    Past Medical History:  Diagnosis Date  . Asthma   . Chronic kidney disease   . Hx of pancreatitis 06/16/2018  . Kidney stone   . Kidney stone   . Migraine   . Orthostatic hypotension   . Osteopenia 03/31/2012  . Recurrent upper respiratory infection (URI)   . Ulcerative colitis ([place]) 03/31/2012  . Urticaria     Family History  Problem Relation Age of Onset  . Cervical cancer Paternal Aunt   . Heart disease Paternal Grandmother   . Heart disease Mother   . Dementia Mother   . Thyroid disease Mother   . Osteoarthritis Mother   . Asthma Mother   . Heart attack Father   . Breast cancer Cousin   . Heart attack Brother   . Heart disease Brother     Past Surgical History:  Procedure Laterality Date  . CESAREAN SECTION     x3  . CHOLECYSTECTOMY    . COLONOSCOPY    . LITHOTRIPSY    . THYROIDECTOMY     1997   Social History   Occupational History  . Occupation: Self-employed  Tobacco Use  . Smoking status: Never Smoker  . Smokeless tobacco: Never Used  Vaping Use  . Vaping Use: Never used  Substance and Sexual Activity  . Alcohol use: No  . Drug use: No  . Sexual activity: Yes

## 2020-12-08 DIAGNOSIS — M1990 Unspecified osteoarthritis, unspecified site: Secondary | ICD-10-CM | POA: Diagnosis not present

## 2020-12-08 DIAGNOSIS — M810 Age-related osteoporosis without current pathological fracture: Secondary | ICD-10-CM | POA: Diagnosis not present

## 2020-12-08 DIAGNOSIS — M79644 Pain in right finger(s): Secondary | ICD-10-CM | POA: Diagnosis not present

## 2020-12-08 DIAGNOSIS — K5190 Ulcerative colitis, unspecified, without complications: Secondary | ICD-10-CM | POA: Diagnosis not present

## 2020-12-08 DIAGNOSIS — K7689 Other specified diseases of liver: Secondary | ICD-10-CM | POA: Diagnosis not present

## 2020-12-08 DIAGNOSIS — M7531 Calcific tendinitis of right shoulder: Secondary | ICD-10-CM | POA: Diagnosis not present

## 2020-12-19 NOTE — Progress Notes (Signed)
Patient referred by [name], MD for abnormal echocardiogram.  Subjective:   Danielle Bond, female    DOB: [birth year], 60 y.o.   MRN: [id number]   Chief Complaint  Patient presents with  . Follow-up  . Shortness of Breath    60 y/o Caucasian female with hyperlipidemia, aortic and coronary atherrosclerosis, presyncope, Graves' disease, status post thyroidectomy, ulcerative colitis, h/o acute pancreatitis, migraine,  Patient was last seen in our office 05/04/2020 by Dr. Virgina Jock.  At that time patient was stable and no changes were made, she was recommended to follow-up in 1 year.  Patient now presents for urgent visit with complaints of shortness of breath.  Patient reports that over the last 2 weeks she has noticed symptoms of "heart racing" at rest, particularly worse at night when she is lying prone.  She states these episodes last a few minutes and are relieved with deep breathing exercises, episodes are occasionally associated with shortness of breath and lightheadedness.  She also reports over the last 3 days she has noticed intermittent shortness of breath lasting several seconds, particularly when she is walking in her house.  At baseline she walks 2 miles 3 days/week without issue, she remains able to do this over the last few days without worsening shortness of breath.  Patient does have history of asthma as well as seasonal allergies, which she states her exacerbated at this time of year.  She has been using her albuterol inhaler for the last 3 days with little to no improvement of shortness of breath symptoms.  She reports a feeling of "tightness" in her chest over the last 3 days with shortness of breath as well.  Patient admits to high levels of stress and relatively increased anxiety over the last few weeks.  Patient denies syncope, near syncope, orthopnea, PND, leg swelling.  Notably in the past patient has had occasional episodes of retrosternal chest pain lasting  several seconds and not correlated with exercise or activity.  Current Outpatient Medications on File Prior to Visit  Medication Sig Dispense Refill  . albuterol (VENTOLIN HFA) 108 (90 Base) MCG/ACT inhaler TAKE 2 PUFFS BY MOUTH EVERY 6 HOURS AS NEEDED FOR WHEEZE OR SHORTNESS OF BREATH 25.5 each 1  . budesonide-formoterol (SYMBICORT) 160-4.5 MCG/ACT inhaler Inhale 2 puffs into the lungs in the morning and at bedtime. 1 each 12  . cholecalciferol (VITAMIN D3) 25 MCG (1000 UNIT) tablet Take 50,000 Units by mouth.    . EPINEPHrine 0.3 mg/0.3 mL IJ SOAJ injection Inject 0.3 mLs (0.3 mg total) into the muscle as needed for anaphylaxis. 1 each 1  . fluticasone (FLONASE) 50 MCG/ACT nasal spray PLACE 2 SPRAYS INTO BOTH NOSTRILS DAILY AS NEEDED FOR ALLERGIES OR RHINITIS. 48 mL 1  . folic acid (FOLVITE) 1 MG tablet Take 1 mg by mouth daily.    Marland Kitchen HUMIRA PEN 40 MG/0.4ML PNKT every 14 (fourteen) days.     Marland Kitchen levothyroxine (SYNTHROID, LEVOTHROID) 112 MCG tablet Take 112 mcg by mouth daily before breakfast.    . Magnesium 300 MG CAPS Take 500 mg by mouth daily.    . Multiple Vitamin (MULTIVITAMIN WITH MINERALS) TABS tablet Take 1 tablet by mouth daily.    . pantoprazole (PROTONIX) 40 MG tablet Take 40 mg by mouth daily.    . Riboflavin 100 MG CAPS Take by mouth.    . rosuvastatin (CRESTOR) 10 MG tablet TAKE 1 TABLET BY MOUTH EVERY DAY 90 tablet 3  . valACYclovir (VALTREX) 500 MG  tablet Take 1 tablet by mouth as needed.    . vitamin C (ASCORBIC ACID) 500 MG tablet Take 500 mg by mouth daily.     No current facility-administered medications on file prior to visit.    Cardiovascular studies:  EKG 12/21/2020:  Sinus rhythm at a rate of 89 bpm.  Normal axis.   Nonspecific T wave abnormality, unchanged compared to previous.  EKG 05/04/2020: Sinus rhythm 94 bpm Nonspecific T-wave abnormality  Echocardiogram 04/28/2019: Normal LV size and thickness. EF 55-60%. Impaired relaxation. Normal RV size and  function. No significant valvular abnormality.   CTA chest 05/23/2018: No demonstrable pulmonary embolus. No thoracic aortic aneurysm or dissection. There is aortic atherosclerosis. Right subclavian artery arises distally to the other great vessels, passing posteriorly to the esophagus and impressing upon the esophagus as it passes to the right. No lung edema or consolidation. No evident thoracic adenopathy. Aortic Atherosclerosis (ICD10-I70.0).  CXR 04/17/2019: Mild cardiomegaly without acute abnormality of the lungs.  CTA coronary angiogram 12/2017: pLAD minimal atherosclerosis. Rest normal. Calcium score 6.   Recent labs: 07/11/2020: Hemoglobin 12.4, hematocrit 38.1, MCV 85.4, platelet 303 Glucose 111, BUN 13, creatinine 0.87, sodium 139, potassium 4.6,  05/02/2020: Chol 168, TG 82, HDL 63, LDL 90  01/18/2020: Chol 245, TG 126, HDL 75, LDL 148  Lipid panel 05/28/2019: Cholesterol 175, triglycerides 70, HDL 66, LDL 96.  Lipid panel 2017: Chol 184, TG 84, HDL 1813, LDL 84.  Lipid panel 2015: Chol 260, TG 73, HDL 59, LDL 193   Review of Systems  Constitutional: Negative for malaise/fatigue and weight gain.  Cardiovascular: Positive for chest pain, dyspnea on exertion and palpitations. Negative for claudication, leg swelling, near-syncope, orthopnea, paroxysmal nocturnal dyspnea and syncope.  Respiratory: Negative for shortness of breath.   Hematologic/Lymphatic: Does not bruise/bleed easily.  Gastrointestinal: Negative for melena.  Neurological: Positive for light-headedness. Negative for weakness.       Vitals:   12/21/20 0912  BP: 128/84  Pulse: 99  Resp: 17  Temp: 98.4 F (36.9 C)  SpO2: 98%     Body mass index is 30 kg/m. Filed Weights   12/21/20 0912  Weight: 153 lb 9.6 oz (69.7 kg)     Objective:   Physical Exam Vitals and nursing note reviewed.  Constitutional:      General: She is not in acute distress. Neck:     Vascular: No JVD.   Cardiovascular:     Rate and Rhythm: Normal rate and regular rhythm.     Pulses: Intact distal pulses.          Carotid pulses are 2+ on the right side and 2+ on the left side.      Radial pulses are 2+ on the right side and 2+ on the left side.       Dorsalis pedis pulses are 2+ on the right side and 2+ on the left side.       Posterior tibial pulses are 2+ on the right side and 2+ on the left side.     Heart sounds: Normal heart sounds, S1 normal and S2 normal. No murmur heard. No gallop.   Pulmonary:     Effort: Pulmonary effort is normal. No respiratory distress.     Breath sounds: Normal breath sounds. No wheezing, rhonchi or rales.  Musculoskeletal:     Right lower leg: No edema.     Left lower leg: No edema.  Neurological:     Mental Status: She is alert.  Assessment & Recommendations:   60 y/o Caucasian female with hyperlipidemia, aortic and coronary atherrosclerosis, presyncope, Graves' disease, status post thyroidectomy, ulcerative colitis, h/o acute pancreatitis, migraine,  Shortness of breath:  Will obtain nuclear stress test to evaluate for underlying ischemia given patient's known coronary atherosclerosis, cardiovascular risk factors, and family history as well as symptoms of shortness of breath and chest discomfort. Suspect shortness of breath may be multifactorial including high levels of anxiety as well as asthma contributing.  Palpitations:  We will obtain 1 week cardiac monitor in order to evaluate for underlying cardiac arrhythmias.  Symptoms sound consistent with PACs or PVCs, however given associated shortness of breath and lightheadedness recommend further evaluation.  Hyperlipidemia: Lipids well controlled.  Continue Crestor.  Aorta and coronary atherosclerosis: Minimal. Continue risk factor modification, especially lipid control. Not on Aspirin due to h/o GI bleeding with ulcerative colitis.   Elevated blood pressure without diagnosis of  hypertension:  Blood pressure was initially mildly elevated in the office today, upon recheck it is well controlled.  We will continue to monitor.  Encourage patient to check blood pressure daily at home and bring with her written log to her next appointment.  Follow-up in 6 weeks, sooner if needed, for shortness of breath, palpitations, and results of cardiac testing.    Alethia Berthold, PA-C 12/21/2020, 12:05 PM Office: [phone]

## 2020-12-21 ENCOUNTER — Ambulatory Visit: Payer: BC Managed Care – PPO | Admitting: Student

## 2020-12-21 ENCOUNTER — Other Ambulatory Visit: Payer: Self-pay

## 2020-12-21 ENCOUNTER — Encounter: Payer: Self-pay | Admitting: Student

## 2020-12-21 VITALS — BP 128/84 | HR 99 | Temp 98.4°F | Resp 17 | Ht 60.0 in | Wt 153.6 lb

## 2020-12-21 DIAGNOSIS — R0602 Shortness of breath: Secondary | ICD-10-CM

## 2020-12-21 DIAGNOSIS — R002 Palpitations: Secondary | ICD-10-CM

## 2020-12-21 DIAGNOSIS — E782 Mixed hyperlipidemia: Secondary | ICD-10-CM

## 2020-12-21 DIAGNOSIS — I700 Atherosclerosis of aorta: Secondary | ICD-10-CM | POA: Diagnosis not present

## 2020-12-21 DIAGNOSIS — R030 Elevated blood-pressure reading, without diagnosis of hypertension: Secondary | ICD-10-CM | POA: Diagnosis not present

## 2021-01-09 ENCOUNTER — Other Ambulatory Visit: Payer: Self-pay

## 2021-01-09 ENCOUNTER — Ambulatory Visit: Payer: BC Managed Care – PPO

## 2021-01-09 DIAGNOSIS — R0602 Shortness of breath: Secondary | ICD-10-CM

## 2021-01-09 DIAGNOSIS — R002 Palpitations: Secondary | ICD-10-CM | POA: Diagnosis not present

## 2021-01-13 ENCOUNTER — Inpatient Hospital Stay: Payer: BC Managed Care – PPO

## 2021-01-17 ENCOUNTER — Other Ambulatory Visit: Payer: Self-pay

## 2021-01-17 ENCOUNTER — Inpatient Hospital Stay: Payer: BC Managed Care – PPO

## 2021-01-17 DIAGNOSIS — R002 Palpitations: Secondary | ICD-10-CM | POA: Diagnosis not present

## 2021-01-17 DIAGNOSIS — R0602 Shortness of breath: Secondary | ICD-10-CM | POA: Diagnosis not present

## 2021-01-31 DIAGNOSIS — R0602 Shortness of breath: Secondary | ICD-10-CM | POA: Diagnosis not present

## 2021-01-31 DIAGNOSIS — R002 Palpitations: Secondary | ICD-10-CM | POA: Diagnosis not present

## 2021-01-31 NOTE — Progress Notes (Addendum)
Patient referred by [name], MD for abnormal echocardiogram.  Subjective:   Danielle Bond, female    DOB: 02/01/1961, 60 y.o.   MRN: [id number]   Chief Complaint  Patient presents with   Shortness of Breath   Follow-up   Results    60 y.o. Caucasian female with hyperlipidemia, aortic and coronary atherrosclerosis, presyncope, Graves' disease, status post thyroidectomy, ulcerative colitis, h/o acute pancreatitis, migraine.   Patient presents for 6-week follow-up of shortness of breath, palpitations, and results of cardiac testing.  At last visit ordered nuclear stress test and cardiac monitor.  Stress test was low risk and results of cardiac monitor revealed no significant cardiac arrhythmia.  Patient continues to have occasional episodes of palpitations.  She is also had worsening sinus congestion and allergies recently which have contributed to shortness of breath.  He has had no recurrence of chest discomfort.Patient denies syncope, near syncope, orthopnea, PND, leg swelling.    Current Outpatient Medications on File Prior to Visit  Medication Sig Dispense Refill   albuterol (VENTOLIN HFA) 108 (90 Base) MCG/ACT inhaler TAKE 2 PUFFS BY MOUTH EVERY 6 HOURS AS NEEDED FOR WHEEZE OR SHORTNESS OF BREATH 25.5 each 1   budesonide-formoterol (SYMBICORT) 160-4.5 MCG/ACT inhaler Inhale 2 puffs into the lungs in the morning and at bedtime. 1 each 12   cholecalciferol (VITAMIN D3) 25 MCG (1000 UNIT) tablet Take 50,000 Units by mouth.     EPINEPHrine 0.3 mg/0.3 mL IJ SOAJ injection Inject 0.3 mLs (0.3 mg total) into the muscle as needed for anaphylaxis. 1 each 1   fluticasone (FLONASE) 50 MCG/ACT nasal spray PLACE 2 SPRAYS INTO BOTH NOSTRILS DAILY AS NEEDED FOR ALLERGIES OR RHINITIS. 48 mL 1   folic acid (FOLVITE) 1 MG tablet Take 1 mg by mouth daily.     HUMIRA PEN 40 MG/0.4ML PNKT every 14 (fourteen) days.      levothyroxine (SYNTHROID, LEVOTHROID) 112 MCG tablet Take 112 mcg by mouth  daily before breakfast.     Magnesium 300 MG CAPS Take 500 mg by mouth daily.     Multiple Vitamin (MULTIVITAMIN WITH MINERALS) TABS tablet Take 1 tablet by mouth daily.     pantoprazole (PROTONIX) 40 MG tablet Take 40 mg by mouth daily.     prednisoLONE 5 MG TABS tablet Take 5 mg by mouth daily.     Riboflavin 100 MG CAPS Take by mouth.     rosuvastatin (CRESTOR) 10 MG tablet TAKE 1 TABLET BY MOUTH EVERY DAY 90 tablet 3   valACYclovir (VALTREX) 500 MG tablet Take 1 tablet by mouth as needed.     vitamin C (ASCORBIC ACID) 500 MG tablet Take 500 mg by mouth daily.     No current facility-administered medications on file prior to visit.    Cardiovascular studies:  Ambulatory cardiac telemetry 01/17/2021- 01/23/2021: Predominant underlying rhythm was sinus with minimum heart rate 57 bpm, maximum heart rate under 63 bpm, average heart rate 86 bpm.  Rare PVCs and PACs.  Symptoms correlated with normal sinus rhythm.  No significant cardiac arrhythmia.  Exercise Sestamibi stress test 01/09/2021: Exercise nuclear stress test was performed using Bruce protocol. Patient reached 7 METS, and 106% of age predicted maximum heart rate. Exercise capacity was low. No chest pain reported. Heart rate and hemodynamic response were normal. Stress EKG revealed no ischemic changes. Normal myocardial perfusion. Stress LVEF 54%. Low risk study.  EKG 12/21/2020:  Sinus rhythm at a rate of 89 bpm.  Normal axis.  Nonspecific T wave abnormality, unchanged compared to previous.  EKG 05/04/2020: Sinus rhythm 94 bpm Nonspecific T-wave abnormality  Echocardiogram 04/28/2019: Normal LV size and thickness. EF 55-60%. Impaired relaxation. Normal RV size and function. No significant valvular abnormality.   CTA chest 05/23/2018: No demonstrable pulmonary embolus. No thoracic aortic aneurysm or dissection. There is aortic atherosclerosis.  Right subclavian artery arises distally to the other great vessels, passing  posteriorly to the esophagus and impressing upon the esophagus as it passes to the right.  No lung edema or consolidation.  No evident thoracic adenopathy. Aortic Atherosclerosis (ICD10-I70.0).  CXR 04/17/2019: Mild cardiomegaly without acute abnormality of the lungs.  CTA coronary angiogram 12/2017: pLAD minimal atherosclerosis. Rest normal. Calcium score 6.   Recent labs: 07/11/2020: Hemoglobin 12.4, hematocrit 38.1, MCV 85.4, platelet 303 Glucose 111, BUN 13, creatinine 0.87, sodium 139, potassium 4.6,  05/02/2020: Chol 168, TG 82, HDL 63, LDL 90  01/18/2020: Chol 245, TG 126, HDL 75, LDL 148  Lipid panel 05/28/2019: Cholesterol 175, triglycerides 70, HDL 66, LDL 96.  Lipid panel 2017: Chol 184, TG 84, HDL 1813, LDL 84.  Lipid panel 2015: Chol 260, TG 73, HDL 59, LDL 193   Review of Systems  Constitutional: Negative for malaise/fatigue and weight gain.  Cardiovascular:  Positive for palpitations. Negative for chest pain, claudication, dyspnea on exertion, leg swelling, near-syncope, orthopnea, paroxysmal nocturnal dyspnea and syncope.  Respiratory:  Positive for shortness of breath.   Hematologic/Lymphatic: Does not bruise/bleed easily.  Gastrointestinal:  Negative for melena.  Neurological:  Negative for light-headedness and weakness.      Vitals:   02/01/21 0927  BP: 134/82  Pulse: (!) 109  Temp: 98.1 F (36.7 C)  SpO2: 97%     Body mass index is 30.47 kg/m. Filed Weights   02/01/21 0927  Weight: 156 lb (70.8 kg)     Objective:   Physical Exam Vitals and nursing note reviewed.  Constitutional:      General: She is not in acute distress. HENT:     Head: Normocephalic and atraumatic.  Neck:     Vascular: No JVD.  Cardiovascular:     Rate and Rhythm: Normal rate and regular rhythm.     Pulses: Intact distal pulses.          Carotid pulses are 2+ on the right side and 2+ on the left side.      Radial pulses are 2+ on the right side and 2+ on the  left side.       Dorsalis pedis pulses are 2+ on the right side and 2+ on the left side.       Posterior tibial pulses are 2+ on the right side and 2+ on the left side.     Heart sounds: Normal heart sounds, S1 normal and S2 normal. No murmur heard.   No gallop.  Pulmonary:     Effort: Pulmonary effort is normal. No respiratory distress.     Breath sounds: Normal breath sounds. No wheezing, rhonchi or rales.  Musculoskeletal:     Right lower leg: No edema.     Left lower leg: No edema.  Skin:    General: Skin is warm and dry.  Neurological:     Mental Status: She is alert.       Assessment & Recommendations:   60 y.o.Caucasian female with hyperlipidemia, aortic and coronary atherrosclerosis, presyncope, Graves' disease, status post thyroidectomy, ulcerative colitis, h/o acute pancreatitis, migraine.   Shortness of breath:  Reviewed and  discussed with patient regarding results of ambulatory cardiac telemetry as well as nuclear stress test, details above.  Stress test was low risk and monitor revealed no significant cardiac arrhythmias. Suspect patient's shortness of breath to be multifactorial including asthma as well as deconditioning given patient's low exercise capacity on stress test.  Palpitations:  Patient's amatory cardiac telemetry revealed rare PACs and PVCs, however her symptoms correlated with normal sinus rhythm.  Reassured patient there is no significant cardiac arrhythmia as underlying etiology of her palpitations. Notably patient does have Graves' disease, status post thyroidectomy, for which she follows with Dr. Soyla Murphy.  Would recommend obtaining TSH to evaluate for thyroid dysfunction as underlying of patient's palpitations.  Patient prefers to undergo lab work-up with Dr. Michiel Sites, therefore I will not order thyroid panel at this time.  Hyperlipidemia: Lipids well controlled.  Continue Crestor. Patient has annual visit scheduled with PCP in December, will defer repeat  lipid profile testing to PCP.  Aorta and coronary atherosclerosis: Minimal. Continue risk factor modification, especially lipid control. Not on Aspirin due to h/o GI bleeding with ulcerative colitis.   Elevated blood pressure without diagnosis of hypertension:  Blood pressure is well controlled at today's office visit.  Encourage patient to continue to monitor at home.  Follow-up in 1 year, sooner if needed, for palpitations, hyperlipidemia, and blood pressure.   Alethia Berthold, PA-C 02/01/2021, 1:28 PM Office: [phone]  Addendum 02/17/2021: Patient called to correct an error in the above records.  Patient previously been listed at 60 years old, however she is 60 years old.  Direction was made in the HPI in assessment and plan.  Patient also notes she is not Caucasian as noted in HPI and assessment, but is rather Hispanic.  Records going forward will be corrected accordingly.  Patient also noted instances in the above record in which incorrect pronouns were used, this is likely due to error in dictation software, however noted be noted that patient is female and identifies as female.   Alethia Berthold, PA-C 02/17/2021, 3:14 PM Office: [phone]

## 2021-02-01 ENCOUNTER — Ambulatory Visit: Payer: BC Managed Care – PPO | Admitting: Student

## 2021-02-01 ENCOUNTER — Other Ambulatory Visit: Payer: Self-pay

## 2021-02-01 ENCOUNTER — Encounter: Payer: Self-pay | Admitting: Student

## 2021-02-01 VITALS — BP 134/82 | HR 109 | Temp 98.1°F | Ht 60.0 in | Wt 156.0 lb

## 2021-02-01 DIAGNOSIS — R0602 Shortness of breath: Secondary | ICD-10-CM

## 2021-02-01 DIAGNOSIS — R002 Palpitations: Secondary | ICD-10-CM | POA: Diagnosis not present

## 2021-02-01 DIAGNOSIS — E782 Mixed hyperlipidemia: Secondary | ICD-10-CM

## 2021-02-03 DIAGNOSIS — E890 Postprocedural hypothyroidism: Secondary | ICD-10-CM | POA: Diagnosis not present

## 2021-02-06 DIAGNOSIS — M545 Low back pain, unspecified: Secondary | ICD-10-CM | POA: Diagnosis not present

## 2021-02-06 DIAGNOSIS — M810 Age-related osteoporosis without current pathological fracture: Secondary | ICD-10-CM | POA: Diagnosis not present

## 2021-02-06 DIAGNOSIS — Z1211 Encounter for screening for malignant neoplasm of colon: Secondary | ICD-10-CM | POA: Diagnosis not present

## 2021-02-06 DIAGNOSIS — Z1231 Encounter for screening mammogram for malignant neoplasm of breast: Secondary | ICD-10-CM | POA: Diagnosis not present

## 2021-02-06 DIAGNOSIS — Z01419 Encounter for gynecological examination (general) (routine) without abnormal findings: Secondary | ICD-10-CM | POA: Diagnosis not present

## 2021-02-09 DIAGNOSIS — H6981 Other specified disorders of Eustachian tube, right ear: Secondary | ICD-10-CM | POA: Diagnosis not present

## 2021-02-09 DIAGNOSIS — H6691 Otitis media, unspecified, right ear: Secondary | ICD-10-CM | POA: Diagnosis not present

## 2021-02-14 ENCOUNTER — Other Ambulatory Visit: Payer: Self-pay | Admitting: Obstetrics and Gynecology

## 2021-02-14 DIAGNOSIS — N6320 Unspecified lump in the left breast, unspecified quadrant: Secondary | ICD-10-CM

## 2021-02-17 ENCOUNTER — Other Ambulatory Visit: Payer: Self-pay | Admitting: Obstetrics and Gynecology

## 2021-02-17 ENCOUNTER — Telehealth: Payer: Self-pay | Admitting: Student

## 2021-02-17 DIAGNOSIS — N6320 Unspecified lump in the left breast, unspecified quadrant: Secondary | ICD-10-CM

## 2021-02-17 DIAGNOSIS — R928 Other abnormal and inconclusive findings on diagnostic imaging of breast: Secondary | ICD-10-CM

## 2021-02-17 NOTE — Telephone Encounter (Signed)
7/29 PT says she LVM last week w/concerns about results. Says under results, it has her listed as a 60 y.o. man, w/a list of diagnoses that was not discussed w/her. Wondering if they were not mentioned to her or if another PT's results appeared instead accidentally. Please call her to let her know if they are her results or not. [facility]

## 2021-02-17 NOTE — Telephone Encounter (Signed)
Addendum has been made to patient's record and errors will be corrected going forward.

## 2021-03-02 ENCOUNTER — Ambulatory Visit
Admission: RE | Admit: 2021-03-02 | Discharge: 2021-03-02 | Disposition: A | Discharge status: Home / Self Care | Payer: BC Managed Care – PPO | Source: Ambulatory Visit | Attending: Obstetrics and Gynecology | Admitting: Obstetrics and Gynecology

## 2021-03-02 ENCOUNTER — Other Ambulatory Visit: Payer: Self-pay

## 2021-03-02 DIAGNOSIS — N6002 Solitary cyst of left breast: Secondary | ICD-10-CM | POA: Diagnosis not present

## 2021-03-02 DIAGNOSIS — N6320 Unspecified lump in the left breast, unspecified quadrant: Secondary | ICD-10-CM

## 2021-03-02 DIAGNOSIS — R922 Inconclusive mammogram: Secondary | ICD-10-CM | POA: Diagnosis not present

## 2021-03-02 DIAGNOSIS — R928 Other abnormal and inconclusive findings on diagnostic imaging of breast: Secondary | ICD-10-CM

## 2021-03-02 DIAGNOSIS — N644 Mastodynia: Secondary | ICD-10-CM | POA: Diagnosis not present

## 2021-03-02 LAB — HM MAMMOGRAPHY

## 2021-03-06 DIAGNOSIS — H6981 Other specified disorders of Eustachian tube, right ear: Secondary | ICD-10-CM | POA: Diagnosis not present

## 2021-03-08 DIAGNOSIS — M7531 Calcific tendinitis of right shoulder: Secondary | ICD-10-CM | POA: Diagnosis not present

## 2021-03-08 DIAGNOSIS — M79644 Pain in right finger(s): Secondary | ICD-10-CM | POA: Diagnosis not present

## 2021-03-08 DIAGNOSIS — K5190 Ulcerative colitis, unspecified, without complications: Secondary | ICD-10-CM | POA: Diagnosis not present

## 2021-03-08 DIAGNOSIS — M7582 Other shoulder lesions, left shoulder: Secondary | ICD-10-CM | POA: Diagnosis not present

## 2021-03-29 ENCOUNTER — Telehealth: Payer: Self-pay

## 2021-03-29 ENCOUNTER — Telehealth: Payer: Self-pay | Admitting: Physician Assistant

## 2021-03-29 ENCOUNTER — Ambulatory Visit (INDEPENDENT_AMBULATORY_CARE_PROVIDER_SITE_OTHER): Payer: BC Managed Care – PPO | Admitting: Physician Assistant

## 2021-03-29 ENCOUNTER — Other Ambulatory Visit: Payer: Self-pay

## 2021-03-29 ENCOUNTER — Encounter: Payer: Self-pay | Admitting: Physician Assistant

## 2021-03-29 DIAGNOSIS — L509 Urticaria, unspecified: Secondary | ICD-10-CM | POA: Diagnosis not present

## 2021-03-29 DIAGNOSIS — L03031 Cellulitis of right toe: Secondary | ICD-10-CM

## 2021-03-29 LAB — POCT SKIN KOH: Skin KOH, POC: NEGATIVE

## 2021-03-29 MED ORDER — LEVOCETIRIZINE DIHYDROCHLORIDE 5 MG PO TABS
5.0000 mg | ORAL_TABLET | Freq: Every evening | ORAL | 2 refills | Status: DC
Start: 2021-03-29 — End: 2021-06-27

## 2021-03-29 MED ORDER — CLOTRIMAZOLE-BETAMETHASONE 1-0.05 % EX CREA
1.0000 | TOPICAL_CREAM | Freq: Two times a day (BID) | CUTANEOUS | 0 refills | Status: DC
Start: 2021-03-29 — End: 2021-08-23

## 2021-03-29 MED ORDER — TRIAMCINOLONE ACETONIDE 0.1 % EX CREA: 1.0000 "application " | TOPICAL_CREAM | Freq: Every day | CUTANEOUS | 6 refills | Status: DC | PRN

## 2021-03-29 NOTE — Telephone Encounter (Signed)
Left message to return call to our office at their convenience.   Dr [name] has an opening tomorrow at 30, advise patient to go to [facility]

## 2021-03-29 NOTE — Progress Notes (Signed)
   Follow-Up Visit   Subjective  Danielle Bond is a 60 y.o. female who presents for the following: Rash (Right arm itch pink currently, left lower leg bite like areas. She did say she changes detergents x 1 month ago and shower gel x 3 weeks ago. She has a history of a rash on her right forearm x months.  She has seen an allergist but didn't have to take immune therapy for allergies. She does not have her gallbladder and her thyroid work up is normal. Her toes on her right foot are thick and peeling.  She had her great toenail removed by a podiatrist a year ago. It has grown back with no cuticle and is jagged on the end.  Her pinky toenail is also disfigured. Patient stated that she is on Humira for ulcerative colitis and she is doing well.    The following portions of the chart were reviewed this encounter and updated as appropriate:  Tobacco  Allergies  Meds  Problems  Med Hx  Surg Hx  Fam Hx      Objective  Well appearing patient in no apparent distress; mood and affect are within normal limits.  A full examination was performed including scalp, head, eyes, ears, nose, lips, neck, chest, axillae, abdomen, back, buttocks, bilateral upper extremities, bilateral lower extremities, hands, feet, fingers, toes, fingernails, and toenails. All findings within normal limits unless otherwise noted below.  Right Foot - Anterior Scaling and maceration between toes and over distal and lateral soles.   Right Forearm - Posterior Erythematous xerosis. Positive brisk dermatographism. No whelps today.  Assessment & Plan  Paronychia of great toe of right foot Right Foot - Anterior  POCT Skin KOH - Right Foot - Anterior  clotrimazole-betamethasone (LOTRISONE) cream - Right Foot - Anterior Apply 1 application topically 2 (two) times daily.  Related Medications triamcinolone cream (KENALOG) 0.1 % Apply 1 application topically daily as needed.  Urticaria Right Forearm -  Posterior  levocetirizine (XYZAL ALLERGY 24HR) 5 MG tablet - Right Forearm - Posterior Take 1 tablet (5 mg total) by mouth every evening.   I, Mariaelena Cade, PA-C, have reviewed all documentation's for this visit.  The documentation on 03/29/21 for the exam, diagnosis, procedures and orders are all accurate and complete.

## 2021-03-29 NOTE — Telephone Encounter (Signed)
Patient already spoke with [facility] [place] about medication being too expensive.

## 2021-03-29 NOTE — Telephone Encounter (Signed)
Patient Name: Danielle Bond [facility] Gender: Female DOB: [birth year] Age: 60 Y 34 M 24 D Return Phone Number: [phone] (Primary) Address: City/ State/ Zip: [place]  [address] Client [place] [facility] [place] [facility] [place] [place] Type Call Who Is Calling Patient / Member / Family / Caregiver Call Type Triage / Clinical Caller Name Carolynn Tuley Relationship To Patient Self Return Phone Number [phone] (Primary) Chief Complaint NUMBNESS/TINGLING- sudden on one side of the body or face Reason for Call Symptomatic / Request for [place] states she is having tingling all over body mostly left sided and in the leg. She has plantar fascitis and is wearing a compression sock. [place] Not Listed [facility] Translation No Nurse Assessment Nurse: Eugenio Hoes, RN, Jenny Reichmann Date/Time (Eastern Time): 03/29/2021 12:13:08 PM Confirm and document reason for call. If symptomatic, describe symptoms. ---Caller states she is having tingling all over body mostly left sided and in the leg. She started wearing a compression sock for about 2 days. Starts from the bottom of the foot and goes to the rest of her body. Feels like when her foot falls asleep and cant wake it up. Happens when she goes to lay down and when she is driving. She has a baker's cyst to her left leg. Does the patient have any new or worsening symptoms? ---Yes Will a triage be completed? ---Yes Related visit to physician within the last 2 weeks? ---No Does the PT have any chronic conditions? (i.e. diabetes, asthma, this includes High risk factors for pregnancy, etc.) ---Yes List chronic conditions. ---asthma, UC Is this a behavioral health or substance abuse call? ---No PLEASE NOTE: All timestamps contained within this report are represented as [place] Standard Time. CONFIDENTIALTY  NOTICE: This fax transmission is intended only for the addressee. It contains information that is legally privileged, confidential or otherwise protected from use or disclosure. If you are not the intended recipient, you are strictly prohibited from reviewing, disclosing, copying using or disseminating any of this information or taking any action in reliance on or regarding this information. If you have received this fax in error, please notify us immediately by telephone so that we can arrange for its return to [name]. Phone: [phone], Toll-Free: [phone], Fax: [phone] Page: 2 of 2 Call Id: [id number] Guidelines Guideline Title Affirmed Question Affirmed Notes Nurse Date/Time Eilene Ghazi Time) Neurologic Deficit [1] Weakness of the face, arm / hand, or leg / foot on one side of the body AND [2] gradual onset (e.g., days to weeks) AND [3] present now Lynett Fish 03/29/2021 12:15:43 PM Disp. Time Eilene Ghazi Time) Disposition Final User 03/29/2021 12:11:58 PM Send to Urgent Queue Vinnie Level 03/29/2021 12:26:51 PM See HCP within 4 Hours (or PCP triage) Yes Eugenio Hoes, RN, [place] Disagree/Comply Comply Caller Understands Yes PreDisposition Did not know what to do Care Advice Given Per Guideline SEE HCP (OR PCP TRIAGE) WITHIN 4 HOURS: * IF OFFICE WILL BE OPEN: You need to be seen within the next 3 or 4 hours. Call your doctor (or NP/PA) now or as soon as the office opens. CALL BACK IF: * You become worse CARE ADVICE given per Neurologic Deficit (Adult) guideline. Comments User: Baird Cancer, RN Date/Time Eilene Ghazi Time): 03/29/2021 12:27:03 PM no appointment today Referrals GO TO FACILITY OTHER - SPECIFY

## 2021-03-29 NOTE — Telephone Encounter (Signed)
Please schedule follow up with available provider.

## 2021-03-29 NOTE — Telephone Encounter (Signed)
Picked up meds ---clotrinazone betamethasone + triamcinolone ,KRS the cream is for the feet, BUT they are both creams. Which is for feet and which is for arm. ALSO: she has a sensitivity to steroids and says one is partial steroid.

## 2021-03-29 NOTE — Telephone Encounter (Signed)
Called pt for appt. Offered appt for tomorrow with Dr [name] but denied. Pt only wants to see Dr [name]. Talked to [facility] about virtual appt, appt has to be in person.

## 2021-03-30 ENCOUNTER — Ambulatory Visit: Payer: BC Managed Care – PPO | Admitting: Family Medicine

## 2021-03-31 NOTE — Telephone Encounter (Signed)
Patient has F/U appointment on 04/03/2021

## 2021-04-03 ENCOUNTER — Telehealth (INDEPENDENT_AMBULATORY_CARE_PROVIDER_SITE_OTHER): Payer: BC Managed Care – PPO | Admitting: Family Medicine

## 2021-04-03 ENCOUNTER — Telehealth: Payer: Self-pay | Admitting: Physician Assistant

## 2021-04-03 ENCOUNTER — Encounter: Payer: Self-pay | Admitting: Family Medicine

## 2021-04-03 ENCOUNTER — Ambulatory Visit: Payer: BC Managed Care – PPO | Admitting: Family Medicine

## 2021-04-03 VITALS — Ht 60.0 in | Wt 150.0 lb

## 2021-04-03 DIAGNOSIS — E559 Vitamin D deficiency, unspecified: Secondary | ICD-10-CM | POA: Diagnosis not present

## 2021-04-03 DIAGNOSIS — R202 Paresthesia of skin: Secondary | ICD-10-CM

## 2021-04-03 DIAGNOSIS — E039 Hypothyroidism, unspecified: Secondary | ICD-10-CM

## 2021-04-03 DIAGNOSIS — K51011 Ulcerative (chronic) pancolitis with rectal bleeding: Secondary | ICD-10-CM | POA: Diagnosis not present

## 2021-04-03 NOTE — Telephone Encounter (Signed)
Cash pay 25$ WITH [facility] patient aware

## 2021-04-03 NOTE — Assessment & Plan Note (Signed)
Follows with gastroenterology.  She is on Humira.  Could be contributing to possible B12 deficiency.  We will check labs as above.

## 2021-04-03 NOTE — Telephone Encounter (Signed)
Xyzal medication was too expensive (over $300) Patient states that she will just continue with Benadryl PRN. She was calling to let [facility] know and also to request that this medication be taken off of her medication list in Epic.

## 2021-04-03 NOTE — Assessment & Plan Note (Signed)
Follows with endocrinology.  We will be checking TSH with blood work.

## 2021-04-03 NOTE — Progress Notes (Signed)
   Danielle Bond is a 60 y.o. female who presents today for a virtual office visit.  Assessment/Plan:  New/Acute Problems: Paresthesias Discussed limitations of virtual visit and inability perform physical exam.  Broad differential at this point includes compressive neuropathy, B12 deficiency, or component of her underlying autoimmune disease.  She will come in for labs and we will check CBC, c-Met, TSH, B12.  If these are normal would consider referral to sports medicine and/or advanced imaging at that time to evaluate for lumbar radiculopathy.  Rash Stable.  Continue management per dermatology.  Chronic Problems Addressed Today: Hypothyroidism s/p total thyroidectomy 1997 Follows with endocrinology.  We will be checking TSH with blood work.  Ulcerative colitis Follows with gastroenterology.  She is on Humira.  Could be contributing to possible B12 deficiency.  We will check labs as above.     Subjective:  HPI:  Patient with a couple of issues that she would like to discuss.  She developed a rash over the last few weeks.  She saw dermatologist.  She was started on topical steroid and antihistamine.  She is not sure if either these Appel.  The antihistamine that was prescribed was too expensive but she has been taking Benadryl.  Rash seems to be overall stable.  She is also had issues with intermittent numbness and tingling.  This predominantly happens in her left foot but can happen all over her body when she is laying down to sleep.  No obvious injuries or other precipitating events.  She had something similar happen several years ago with a B12 deficiency.  She has been taking a B12 supplement for the past 4 months or so at the direction of her gastroenterologist.  She has not noticed that this is made any improvement.  She also has a history of sciatica and has been having a little increase in low back pain.       Objective/Observations  Physical Exam: Gen: NAD, resting  comfortably Pulm: Normal work of breathing Neuro: Grossly normal, moves all extremities Psych: Normal affect and thought content  Virtual Visit via Video   I connected with Danielle Bond on 04/03/21 at  4:00 PM EDT by a video enabled telemedicine application and verified that I am speaking with the correct person using two identifiers. The limitations of evaluation and management by telemedicine and the availability of in person appointments were discussed. The patient expressed understanding and agreed to proceed.   Patient location: Home Provider location: [place] participating in the virtual visit: Myself and Patient     [name]. [name], MD 04/03/2021 4:03 PM

## 2021-04-04 ENCOUNTER — Other Ambulatory Visit: Payer: BC Managed Care – PPO

## 2021-04-04 ENCOUNTER — Other Ambulatory Visit: Payer: Self-pay

## 2021-04-04 DIAGNOSIS — E782 Mixed hyperlipidemia: Secondary | ICD-10-CM

## 2021-04-04 LAB — COMPREHENSIVE METABOLIC PANEL
ALT: 18 U/L (ref 0–35)
AST: 24 U/L (ref 0–37)
Albumin: 4.2 g/dL (ref 3.5–5.2)
Alkaline Phosphatase: 49 U/L (ref 39–117)
BUN: 11 mg/dL (ref 6–23)
CO2: 26 mEq/L (ref 19–32)
Calcium: 9.5 mg/dL (ref 8.4–10.5)
Chloride: 103 mEq/L (ref 96–112)
Creatinine, Ser: 0.9 mg/dL (ref 0.40–1.20)
GFR: 69.53 mL/min (ref >60.00)
Glucose, Bld: 109 mg/dL — ABNORMAL HIGH (ref 70–99)
Potassium: 4.1 mEq/L (ref 3.5–5.1)
Sodium: 140 mEq/L (ref 135–145)
Total Bilirubin: 0.8 mg/dL (ref 0.2–1.2)
Total Protein: 7.3 g/dL (ref 6.0–8.3)

## 2021-04-04 LAB — TSH: TSH: 2.61 u[IU]/mL (ref 0.35–5.50)

## 2021-04-04 LAB — CBC
HCT: 40.4 % (ref 36.0–46.0)
Hemoglobin: 13.3 g/dL (ref 12.0–15.0)
MCHC: 33 g/dL (ref 30.0–36.0)
MCV: 90.6 fl (ref 78.0–100.0)
Platelets: 234 10*3/uL (ref 150.0–400.0)
RBC: 4.46 Mil/uL (ref 3.87–5.11)
RDW: 13.8 % (ref 11.5–15.5)
WBC: 5 10*3/uL (ref 4.0–10.5)

## 2021-04-04 LAB — VITAMIN B12: Vitamin B-12: 997 pg/mL — ABNORMAL HIGH (ref 211–911)

## 2021-04-04 LAB — HEMOGLOBIN A1C: Hgb A1c MFr Bld: 6.2 % (ref 4.6–6.5)

## 2021-04-04 LAB — VITAMIN D 25 HYDROXY (VIT D DEFICIENCY, FRACTURES): VITD: 41.05 ng/mL (ref 30.00–100.00)

## 2021-04-05 NOTE — Progress Notes (Signed)
Please inform patient of the following:  Her blood sugar is in the borderline range but I do not think this is causing her symptoms. Her B12 is normal.   Like we discussed at her office visit I am concerned that she may be having a pinched nerve in her back that is causing her symptoms.  We can refer her to sports medicine for further evaluation or we can start here with an x-ray. Please place referral if she wishes to do that or place order for complete lumbar spine plain film if she wishes to start with the xrays.

## 2021-04-18 DIAGNOSIS — R768 Other specified abnormal immunological findings in serum: Secondary | ICD-10-CM | POA: Diagnosis not present

## 2021-04-18 DIAGNOSIS — M79644 Pain in right finger(s): Secondary | ICD-10-CM | POA: Diagnosis not present

## 2021-04-18 DIAGNOSIS — M810 Age-related osteoporosis without current pathological fracture: Secondary | ICD-10-CM | POA: Diagnosis not present

## 2021-04-18 DIAGNOSIS — K5190 Ulcerative colitis, unspecified, without complications: Secondary | ICD-10-CM | POA: Diagnosis not present

## 2021-04-18 LAB — HEPATIC FUNCTION PANEL
ALT: 33 (ref 7–35)
AST: 31 (ref 13–35)
Alkaline Phosphatase: 61 (ref 25–125)
Bilirubin, Total: 0.7

## 2021-04-18 LAB — CBC AND DIFFERENTIAL
HCT: 43 (ref 36–46)
Hemoglobin: 13.8 (ref 12.0–16.0)
Neutrophils Absolute: 54.5
Platelets: 295 (ref 150–399)
WBC: 6

## 2021-04-18 LAB — BASIC METABOLIC PANEL: BUN: 14 (ref 4–21)

## 2021-04-18 LAB — COMPREHENSIVE METABOLIC PANEL: Albumin: 4.2 (ref 3.5–5.0)

## 2021-04-18 LAB — CBC: RBC: 4.31 (ref 3.87–5.11)

## 2021-04-24 ENCOUNTER — Other Ambulatory Visit: Payer: Self-pay | Admitting: Family Medicine

## 2021-05-04 ENCOUNTER — Ambulatory Visit: Payer: BC Managed Care – PPO | Admitting: Allergy

## 2021-05-04 DIAGNOSIS — M79644 Pain in right finger(s): Secondary | ICD-10-CM | POA: Diagnosis not present

## 2021-05-04 DIAGNOSIS — K5190 Ulcerative colitis, unspecified, without complications: Secondary | ICD-10-CM | POA: Diagnosis not present

## 2021-05-04 DIAGNOSIS — R768 Other specified abnormal immunological findings in serum: Secondary | ICD-10-CM | POA: Diagnosis not present

## 2021-05-04 DIAGNOSIS — M810 Age-related osteoporosis without current pathological fracture: Secondary | ICD-10-CM | POA: Diagnosis not present

## 2021-05-04 DIAGNOSIS — M25521 Pain in right elbow: Secondary | ICD-10-CM | POA: Diagnosis not present

## 2021-05-05 ENCOUNTER — Ambulatory Visit: Payer: BC Managed Care – PPO | Admitting: Cardiology

## 2021-06-08 DIAGNOSIS — K5190 Ulcerative colitis, unspecified, without complications: Secondary | ICD-10-CM | POA: Diagnosis not present

## 2021-06-13 ENCOUNTER — Encounter: Payer: Self-pay | Admitting: Family Medicine

## 2021-06-13 ENCOUNTER — Other Ambulatory Visit: Payer: Self-pay

## 2021-06-13 ENCOUNTER — Ambulatory Visit (INDEPENDENT_AMBULATORY_CARE_PROVIDER_SITE_OTHER): Payer: BC Managed Care – PPO | Admitting: Family Medicine

## 2021-06-13 VITALS — BP 130/87 | HR 97 | Temp 97.9°F | Ht 60.0 in | Wt 151.1 lb

## 2021-06-13 DIAGNOSIS — J4540 Moderate persistent asthma, uncomplicated: Secondary | ICD-10-CM

## 2021-06-13 DIAGNOSIS — H6980 Other specified disorders of Eustachian tube, unspecified ear: Secondary | ICD-10-CM | POA: Insufficient documentation

## 2021-06-13 DIAGNOSIS — R739 Hyperglycemia, unspecified: Secondary | ICD-10-CM

## 2021-06-13 DIAGNOSIS — H6983 Other specified disorders of Eustachian tube, bilateral: Secondary | ICD-10-CM | POA: Diagnosis not present

## 2021-06-13 MED ORDER — ALBUTEROL SULFATE HFA 108 (90 BASE) MCG/ACT IN AERS: INHALATION_SPRAY | RESPIRATORY_TRACT | 1 refills | Status: DC

## 2021-06-13 MED ORDER — AZITHROMYCIN 250 MG PO TABS: ORAL_TABLET | ORAL | 0 refills | Status: DC

## 2021-06-13 NOTE — Assessment & Plan Note (Signed)
Follows with ENT.  Did not tolerate tympanostomy tubes.  Did not tolerate astelin. She is on flonase.

## 2021-06-13 NOTE — Progress Notes (Signed)
   Danielle Bond is a 60 y.o. female who presents today for an office visit.  Assessment/Plan:  New/Acute Problems: Otitis Media Has penicillin allergy.  We will start azithromycin.  This is worked well for her in the past.  She has eustachian tube dysfunction which is contributing.  See below.  Chronic Problems Addressed Today: Eustachian tube dysfunction Follows with ENT.  Did not tolerate tympanostomy tubes.  Did not tolerate astelin. She is on flonase.   Asthma Stable. Follows with pulmonology. Will refill her albuterol today.     Subjective:  HPI:  She complain of right ear pain. This started about 2 weeks ago. Also noticed more nasal drainage and congestion. She thinks this is due to weather changes. She had similar issue in the past. She tried Zithromax in the past which helped with the symptoms. Associated symptoms are itching, swelling and pain. She saw her ENT in the summer.         Objective:  Physical Exam: BP 130/87   Pulse 97   Temp 97.9 F (36.6 C) (Temporal)   Ht 5' (1.524 m)   Wt 151 lb 2 oz (68.5 kg)   LMP 03/03/2013   SpO2 98%   BMI 29.51 kg/m   Gen: No acute distress, resting comfortably HEENT: Right TM erythematous and bulging with purulent discharge.  Left TM clear. CV: Regular rate and rhythm with no murmurs appreciated Pulm: Normal work of breathing, clear to auscultation bilaterally with no crackles, wheezes, or rhonchi Neuro: Grossly normal, moves all extremities Psych: Normal affect and thought content      I,[name],acting as a scribe for Dimas Chyle, MD.,have documented all relevant documentation on the behalf of Dimas Chyle, MD,as directed by  Dimas Chyle, MD while in the presence of Dimas Chyle, MD.   I, Dimas Chyle, MD, have reviewed all documentation for this visit. The documentation on 06/13/21 for the exam, diagnosis, procedures, and orders are all accurate and complete.  [name]. [name], MD 06/13/2021 12:10 PM

## 2021-06-13 NOTE — Assessment & Plan Note (Signed)
Stable. Follows with pulmonology. Will refill her albuterol today.

## 2021-06-20 ENCOUNTER — Telehealth: Payer: Self-pay

## 2021-06-20 NOTE — Telephone Encounter (Signed)
Pt called stating that she out of the Albuterol Solution for her nebulizer. Pt wants to know if she can have a refill sent to the pharmacy. I do not see prescription on list. Please Advise.

## 2021-06-21 DIAGNOSIS — M810 Age-related osteoporosis without current pathological fracture: Secondary | ICD-10-CM | POA: Diagnosis not present

## 2021-06-21 LAB — HM DEXA SCAN

## 2021-06-22 MED ORDER — ALBUTEROL SULFATE (2.5 MG/3ML) 0.083% IN NEBU: 2.5000 mg | INHALATION_SOLUTION | Freq: Four times a day (QID) | RESPIRATORY_TRACT | 1 refills | Status: AC | PRN

## 2021-06-22 NOTE — Telephone Encounter (Signed)
Please advise 

## 2021-06-26 DIAGNOSIS — H6981 Other specified disorders of Eustachian tube, right ear: Secondary | ICD-10-CM | POA: Diagnosis not present

## 2021-06-27 ENCOUNTER — Ambulatory Visit (INDEPENDENT_AMBULATORY_CARE_PROVIDER_SITE_OTHER): Payer: BC Managed Care – PPO | Admitting: Adult Health

## 2021-06-27 ENCOUNTER — Encounter: Payer: Self-pay | Admitting: Adult Health

## 2021-06-27 ENCOUNTER — Ambulatory Visit (INDEPENDENT_AMBULATORY_CARE_PROVIDER_SITE_OTHER): Payer: BC Managed Care – PPO

## 2021-06-27 ENCOUNTER — Other Ambulatory Visit: Payer: Self-pay

## 2021-06-27 VITALS — BP 140/86 | HR 107 | Temp 98.4°F | Ht 60.0 in | Wt 147.4 lb

## 2021-06-27 DIAGNOSIS — J4530 Mild persistent asthma, uncomplicated: Secondary | ICD-10-CM

## 2021-06-27 DIAGNOSIS — J45909 Unspecified asthma, uncomplicated: Secondary | ICD-10-CM | POA: Diagnosis not present

## 2021-06-27 MED ORDER — PREDNISONE 20 MG PO TABS: 20.0000 mg | ORAL_TABLET | Freq: Every day | ORAL | 0 refills | Status: DC

## 2021-06-27 NOTE — Progress Notes (Signed)
@Patient  ID: Danielle Bond, female    DOB: 1961/02/10, 60 y.o.   MRN: [id number]  Chief Complaint  Patient presents with   Follow-up    Referring provider: [name], MD  HPI: 60 year old female never smoker followed for moderate persistent asthma with an allergic phenotype, and chronic cough.  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  Graves disease, ulcerative colitis on Humira and pancreatitis  TEST/EVENTS :   06/27/2021 Acute OV : Asthma  Patient presents for an acute office visit.  Complains of cough x2 weeks. Initially had ear infection 2 weeks ago, treated by ENT with Zpack. Was exposed to possible flu cases with her granddaughter over the holiday. She developed body aches, chills, sweats , cough, congestion , joint pains. That has improved but continues to have intermittent cough and wheezing /tightness. Increased use of albuterol . Patient remains on Symbicort twice daily.  Flu shot utd, Covid vaccine x 4 .  No chest pain, orthopnea or edema. Appetite is good.  Can not take high dose steroids or shots, can take low dose without issues.   Allergies  Allergen Reactions   Almond Oil Hives and Shortness Of Breath   Lialda [Mesalamine] Other (See Comments)    Vomiting and rectal bleeding   Other Anaphylaxis    Almonds, tree nuts   Aspirin Hives and Nausea Only    Stomach cramps   Hydromorphone Hcl Other (See Comments)    Caused Enzyme levels to go up   Penicillins Hives and Swelling    Has patient had a PCN reaction causing immediate rash, facial/tongue/throat swelling, SOB or lightheadedness with hypotension: Yes Has patient had a PCN reaction causing severe rash involving mucus membranes or skin necrosis: No Has patient had a PCN reaction that required hospitalization: No Has patient had a PCN reaction occurring within the last 10 years: No  Stomach cramps If all of the above answers are "NO", then may proceed with Cephalosporin use.     Acetaminophen Other (See Comments)    Has autoimmune hepatitis, wants to avoid APAP   Apriso [Mesalamine Er] Nausea And Vomiting   Corticosteroids Other (See Comments)    Had acute pancreatitis x 2 after steroid use.   Imitrex [Sumatriptan] Swelling    Injection caused swelling   Stadol [Butorphanol] Other (See Comments)    hallucinates   Topamax [Topiramate] Other (See Comments)    Kidney stones   Vancomycin Hives   Azathioprine Rash   Pepcid [Famotidine] Nausea And Vomiting    Makes acid worse   Prednisone Nausea And Vomiting    All steroids causes nausea and pancreatitis     Immunization History  Administered Date(s) Administered   Fluad Quad(high Dose 65+) 04/01/2021   Influenza Inj Mdck Quad Pf 03/23/2018   Influenza Inj Mdck Quad With Preservative 05/01/2019   Influenza Split 04/10/2012, 04/24/2013   Influenza, Seasonal, Injecte, Preservative Fre 04/15/2014   Influenza,inj,quad, With Preservative 05/02/2015   Influenza-Unspecified 04/22/2020   Moderna Sars-Covid-2 Vaccination 10/09/2019, 11/09/2019, 05/23/2020   PPD Test 10/05/2013   Pneumococcal Polysaccharide-23 04/22/2008, 04/23/2013, 05/24/2020   Tdap 07/23/2004, 07/22/2012   Varicella 07/22/2012    Past Medical History:  Diagnosis Date   Asthma    Chronic kidney disease    Hx of pancreatitis 06/16/2018   Kidney stone    Kidney stone    Migraine    Orthostatic hypotension    Osteopenia 03/31/2012   Recurrent upper respiratory infection (URI)    Ulcerative colitis ([place])  03/31/2012   Urticaria     Tobacco History: Social History   Tobacco Use  Smoking Status Never  Smokeless Tobacco Never   Counseling given: Not Answered   Outpatient Medications Prior to Visit  Medication Sig Dispense Refill   albuterol (PROVENTIL) (2.5 MG/3ML) 0.083% nebulizer solution Take 3 mLs (2.5 mg total) by nebulization every 6 (six) hours as needed for wheezing or shortness of breath. 150 mL 1   albuterol (VENTOLIN HFA) 108  (90 Base) MCG/ACT inhaler TAKE 2 PUFFS BY MOUTH EVERY 6 HOURS AS NEEDED FOR WHEEZE OR SHORTNESS OF BREATH 25.5 each 1   budesonide-formoterol (SYMBICORT) 160-4.5 MCG/ACT inhaler Inhale 2 puffs into the lungs in the morning and at bedtime. 1 each 12   cholecalciferol (VITAMIN D3) 25 MCG (1000 UNIT) tablet Take 50,000 Units by mouth.     clotrimazole-betamethasone (LOTRISONE) cream Apply 1 application topically 2 (two) times daily. 30 g 0   EPINEPHRINE 0.3 mg/0.3 mL IJ SOAJ injection INJECT 0.3 MLS (0.3 MG TOTAL) INTO THE MUSCLE AS NEEDED FOR ANAPHYLAXIS. 2 each 1   fluticasone (FLONASE) 50 MCG/ACT nasal spray PLACE 2 SPRAYS INTO BOTH NOSTRILS DAILY AS NEEDED FOR ALLERGIES OR RHINITIS. 48 mL 1   folic acid (FOLVITE) 1 MG tablet Take 1 mg by mouth daily.     HUMIRA PEN 40 MG/0.4ML PNKT every 14 (fourteen) days.      levothyroxine (SYNTHROID, LEVOTHROID) 112 MCG tablet Take 112 mcg by mouth daily before breakfast.     Magnesium 300 MG CAPS Take 500 mg by mouth daily.     Multiple Vitamin (MULTIVITAMIN WITH MINERALS) TABS tablet Take 1 tablet by mouth daily.     pantoprazole (PROTONIX) 40 MG tablet Take 40 mg by mouth daily.     Riboflavin 100 MG CAPS Take by mouth.     rosuvastatin (CRESTOR) 10 MG tablet TAKE 1 TABLET BY MOUTH EVERY DAY 90 tablet 3   triamcinolone cream (KENALOG) 0.1 % Apply 1 application topically daily as needed. 453 g 6   valACYclovir (VALTREX) 500 MG tablet Take 1 tablet by mouth as needed.     vitamin C (ASCORBIC ACID) 500 MG tablet Take 500 mg by mouth daily.     azithromycin (ZITHROMAX) 250 MG tablet Take 2 tabs day 1, then 1 tab daily (Patient not taking: Reported on 06/27/2021) 6 each 0   levocetirizine (XYZAL ALLERGY 24HR) 5 MG tablet Take 1 tablet (5 mg total) by mouth every evening. (Patient not taking: Reported on 06/13/2021) 30 tablet 2   No facility-administered medications prior to visit.     Review of Systems:   Constitutional:   No  weight loss, night sweats,   Fevers, chills,  +fatigue, or  lassitude.  HEENT:   No headaches,  Difficulty swallowing,  Tooth/dental problems, or  Sore throat,                No sneezing, itching, ear ache,  +nasal congestion, post nasal drip,   CV:  No chest pain,  Orthopnea, PND, swelling in lower extremities, anasarca, dizziness, palpitations, syncope.   GI  No heartburn, indigestion, abdominal pain, nausea, vomiting, diarrhea, change in bowel habits, loss of appetite, bloody stools.   Resp:   No chest wall deformity  Skin: no rash or lesions.  GU: no dysuria, change in color of urine, no urgency or frequency.  No flank pain, no hematuria   MS:  + joint pains    Physical Exam  BP 140/86 (BP Location:  Left Arm, Patient Position: Sitting, Cuff Size: Normal)   Pulse (!) 107   Temp 98.4 F (36.9 C) (Oral)   Ht 5' (1.524 m)   Wt 147 lb 6.4 oz (66.9 kg)   LMP 03/03/2013   SpO2 100%   BMI 28.79 kg/m   GEN: A/Ox3; pleasant , NAD, well nourished    HEENT:  [place]/AT,  NOSE-clear, THROAT-clear, no lesions, no postnasal drip or exudate noted.   NECK:  Supple w/ fair ROM; no JVD; normal carotid impulses w/o bruits; no thyromegaly or nodules palpated; no lymphadenopathy.    RESP  Clear  P & A; w/o, wheezes/ rales/ or rhonchi. no accessory muscle use, no dullness to percussion  CARD:  RRR, no m/r/g, no peripheral edema, pulses intact, no cyanosis or clubbing.  GI:   Soft & nt; nml bowel sounds; no organomegaly or masses detected.   Musco: Warm bil, no deformities or joint swelling noted.   Neuro: alert, no focal deficits noted.    Skin: Warm, no lesions or rashes    Lab Results:  CBC   BMET   BNP No results found for: BNP  ProBNP No results found for: PROBNP  Imaging: No results found.    PFT Results Latest Ref Rng & Units 12/17/2019  FVC-Pre L 2.15  FVC-Predicted Pre % 75  FVC-Post L 2.12  FVC-Predicted Post % 74  Pre FEV1/FVC % % 88  Post FEV1/FCV % % 89  FEV1-Pre L 1.89   FEV1-Predicted Pre % 85  FEV1-Post L 1.90  DLCO uncorrected ml/min/mmHg 19.37  DLCO UNC% % 109  DLCO corrected ml/min/mmHg 19.37  DLCO COR %Predicted % 109  DLVA Predicted % 136  TLC L 3.39  TLC % Predicted % 76  RV % Predicted % 66    Lab Results  Component Value Date   NITRICOXIDE 18 02/02/2020        Assessment & Plan:   Asthma Flare with recent URI/Flu like illness  Treat with short course of steroids . Check Chest xray , supportive care  Cont on maintenance regimen   Plan  Patient Instructions  Prednisone 68m daily for 5 days  Chest xray today .  Delsym 2 tsp Twice daily  As needed  Cough . Symbicort 2 puffs Twice daily  , rinse after use.  Saline nasal rinses As needed   Albuterol inhaler As needed   Follow-up with Dr. RChase Callerin 3 months and and as needed Please contact office for sooner follow up if symptoms do not improve or worsen or seek emergency care     '     Ernesto Lashway, NP 06/27/2021

## 2021-06-27 NOTE — Patient Instructions (Signed)
Prednisone 44m daily for 5 days  Chest xray today .  Delsym 2 tsp Twice daily  As needed  Cough . Symbicort 2 puffs Twice daily  , rinse after use.  Saline nasal rinses As needed   Albuterol inhaler As needed   Follow-up with Dr. RChase Callerin 3 months and and as needed Please contact office for sooner follow up if symptoms do not improve or worsen or seek emergency care

## 2021-06-27 NOTE — Assessment & Plan Note (Signed)
Flare with recent URI/Flu like illness  Treat with short course of steroids . Check Chest xray , supportive care  Cont on maintenance regimen   Plan  Patient Instructions  Prednisone 73m daily for 5 days  Chest xray today .  Delsym 2 tsp Twice daily  As needed  Cough . Symbicort 2 puffs Twice daily  , rinse after use.  Saline nasal rinses As needed   Albuterol inhaler As needed   Follow-up with Dr. RChase Callerin 3 months and and as needed Please contact office for sooner follow up if symptoms do not improve or worsen or seek emergency care     '

## 2021-06-29 ENCOUNTER — Telehealth: Payer: Self-pay | Admitting: Adult Health

## 2021-06-29 ENCOUNTER — Telehealth: Payer: Self-pay | Admitting: Internal Medicine

## 2021-06-29 NOTE — Telephone Encounter (Signed)
On call- patient called for xray report. Given message that xray showed clear lungs, and to continue with office recommendations.

## 2021-06-29 NOTE — Telephone Encounter (Signed)
Patient requested CXR result from 06/27/21.  Advised Patient someone will contact her once Tammy,NP results. Understanding stated.   Message routed to [facility], NP for CXR results

## 2021-06-29 NOTE — Telephone Encounter (Signed)
Chest x-ray shows clear lungs continue with office visit recommendations and follow-up

## 2021-06-29 NOTE — Telephone Encounter (Signed)
I called the patient and left a message to call back.

## 2021-06-30 DIAGNOSIS — M810 Age-related osteoporosis without current pathological fracture: Secondary | ICD-10-CM | POA: Diagnosis not present

## 2021-06-30 DIAGNOSIS — M79644 Pain in right finger(s): Secondary | ICD-10-CM | POA: Diagnosis not present

## 2021-06-30 DIAGNOSIS — K5190 Ulcerative colitis, unspecified, without complications: Secondary | ICD-10-CM | POA: Diagnosis not present

## 2021-06-30 DIAGNOSIS — R768 Other specified abnormal immunological findings in serum: Secondary | ICD-10-CM | POA: Diagnosis not present

## 2021-07-03 DIAGNOSIS — F4310 Post-traumatic stress disorder, unspecified: Secondary | ICD-10-CM | POA: Diagnosis not present

## 2021-07-04 ENCOUNTER — Encounter: Payer: Self-pay | Admitting: Family Medicine

## 2021-07-04 ENCOUNTER — Encounter: Payer: Self-pay | Admitting: *Deleted

## 2021-07-04 NOTE — Telephone Encounter (Signed)
Tried calling the pt and there was no answer  I have mailed her letter with cxr results

## 2021-07-05 DIAGNOSIS — E890 Postprocedural hypothyroidism: Secondary | ICD-10-CM | POA: Diagnosis not present

## 2021-07-05 DIAGNOSIS — M810 Age-related osteoporosis without current pathological fracture: Secondary | ICD-10-CM | POA: Diagnosis not present

## 2021-07-13 ENCOUNTER — Other Ambulatory Visit: Payer: Self-pay

## 2021-07-13 ENCOUNTER — Ambulatory Visit (INDEPENDENT_AMBULATORY_CARE_PROVIDER_SITE_OTHER): Payer: BC Managed Care – PPO | Admitting: Physician Assistant

## 2021-07-13 ENCOUNTER — Other Ambulatory Visit: Payer: Self-pay | Admitting: Endocrinology

## 2021-07-13 VITALS — BP 130/88 | HR 86 | Temp 98.2°F | Ht 60.0 in | Wt 147.2 lb

## 2021-07-13 DIAGNOSIS — H6691 Otitis media, unspecified, right ear: Secondary | ICD-10-CM | POA: Diagnosis not present

## 2021-07-13 DIAGNOSIS — E7800 Pure hypercholesterolemia, unspecified: Secondary | ICD-10-CM | POA: Diagnosis not present

## 2021-07-13 DIAGNOSIS — E890 Postprocedural hypothyroidism: Secondary | ICD-10-CM

## 2021-07-13 DIAGNOSIS — E119 Type 2 diabetes mellitus without complications: Secondary | ICD-10-CM | POA: Diagnosis not present

## 2021-07-13 DIAGNOSIS — H6983 Other specified disorders of Eustachian tube, bilateral: Secondary | ICD-10-CM

## 2021-07-13 DIAGNOSIS — E559 Vitamin D deficiency, unspecified: Secondary | ICD-10-CM | POA: Diagnosis not present

## 2021-07-13 MED ORDER — DOXYCYCLINE HYCLATE 100 MG PO TABS: 100.0000 mg | ORAL_TABLET | Freq: Two times a day (BID) | ORAL | 0 refills | Status: DC

## 2021-07-13 NOTE — Progress Notes (Signed)
Subjective:    Patient ID: Danielle Bond, female    DOB: 1961-02-06, 60 y.o.   MRN: [id number]  Chief Complaint  Patient presents with   Adenopathy    Right side     HPI Patient is in today for right sided lymphadenopathy and R ear pain. Started 2 days prior to thanksgiving, saw Dr. [name], treated for R OM. Had a bout with colitis after then, which resolved about 3 weeks ago. 3 days later, started with "mild case" of flu.   Pulmonologist - prednisone helped her cough. Inflammation continued.   Two weeks ago entire right side of ear / neck felt swollen.  She met with Dr. Chalmers Cater, endocrinologist today, and her sed rate was a little elevated. She was told to f/up here and that she would also repeat thyroid ultrasound next month.  Dr. Kathlene November, rheum, started her on prednisone 5 mg for the next 30 days.  Due for colonoscopy in March 2023 with GI. She thinks Humira is not helping much anymore.   Past Medical History:  Diagnosis Date   Asthma    Chronic kidney disease    Hx of pancreatitis 06/16/2018   Kidney stone    Kidney stone    Migraine    Orthostatic hypotension    Osteopenia 03/31/2012   Recurrent upper respiratory infection (URI)    Ulcerative colitis ([place]) 03/31/2012   Urticaria     Past Surgical History:  Procedure Laterality Date   CESAREAN SECTION     x3   CHOLECYSTECTOMY     COLONOSCOPY     LITHOTRIPSY     THYROIDECTOMY     1997    Family History  Problem Relation Age of Onset   Cervical cancer Paternal Aunt    Heart disease Paternal Grandmother    Heart disease Mother    Dementia Mother    Thyroid disease Mother    Osteoarthritis Mother    Asthma Mother    Heart attack Father    Breast cancer Cousin    Heart attack Brother    Heart disease Brother     Social History   Tobacco Use   Smoking status: Never   Smokeless tobacco: Never  Vaping Use   Vaping Use: Never used  Substance Use Topics   Alcohol use: No   Drug use: No     Allergies   Allergen Reactions   Almond Oil Hives and Shortness Of Breath   Lialda [Mesalamine] Other (See Comments)    Vomiting and rectal bleeding   Other Anaphylaxis    Almonds, tree nuts   Aspirin Hives and Nausea Only    Stomach cramps   Hydromorphone Hcl Other (See Comments)    Caused Enzyme levels to go up   Penicillins Hives and Swelling    Has patient had a PCN reaction causing immediate rash, facial/tongue/throat swelling, SOB or lightheadedness with hypotension: Yes Has patient had a PCN reaction causing severe rash involving mucus membranes or skin necrosis: No Has patient had a PCN reaction that required hospitalization: No Has patient had a PCN reaction occurring within the last 10 years: No  Stomach cramps If all of the above answers are "NO", then may proceed with Cephalosporin use.    Acetaminophen Other (See Comments)    Has autoimmune hepatitis, wants to avoid APAP   Apriso [Mesalamine Er] Nausea And Vomiting   Corticosteroids Other (See Comments)    Had acute pancreatitis x 2 after steroid use.   Imitrex [Sumatriptan]  Swelling    Injection caused swelling   Stadol [Butorphanol] Other (See Comments)    hallucinates   Topamax [Topiramate] Other (See Comments)    Kidney stones   Vancomycin Hives   Azathioprine Rash   Pepcid [Famotidine] Nausea And Vomiting    Makes acid worse   Prednisone Nausea And Vomiting    All steroids causes nausea and pancreatitis     Review of Systems NEGATIVE UNLESS OTHERWISE INDICATED IN HPI      Objective:     BP 130/88    Pulse 86    Temp 98.2 F (36.8 C)    Ht 5' (1.524 m)    Wt 147 lb 3.2 oz (66.8 kg)    LMP 03/03/2013    SpO2 97%    BMI 28.75 kg/m   Wt Readings from Last 3 Encounters:  07/13/21 147 lb 3.2 oz (66.8 kg)  06/27/21 147 lb 6.4 oz (66.9 kg)  06/13/21 151 lb 2 oz (68.5 kg)    BP Readings from Last 3 Encounters:  07/13/21 130/88  06/27/21 140/86  06/13/21 130/87     Physical Exam Vitals and nursing note  reviewed.  Constitutional:      Appearance: Normal appearance. She is normal weight. She is not toxic-appearing.  HENT:     Head: Normocephalic and atraumatic.     Right Ear: Ear canal and external ear normal. A middle ear effusion is present. Tympanic membrane is erythematous.     Left Ear: Ear canal and external ear normal. A middle ear effusion is present.     Nose: Nose normal.     Mouth/Throat:     Mouth: Mucous membranes are moist.  Eyes:     Extraocular Movements: Extraocular movements intact.     Conjunctiva/sclera: Conjunctivae normal.     Pupils: Pupils are equal, round, and reactive to light.  Cardiovascular:     Rate and Rhythm: Normal rate and regular rhythm.     Pulses: Normal pulses.     Heart sounds: Normal heart sounds.  Pulmonary:     Effort: Pulmonary effort is normal.     Breath sounds: Normal breath sounds.  Abdominal:     General: Abdomen is flat. Bowel sounds are normal.     Palpations: Abdomen is soft.  Musculoskeletal:        General: Normal range of motion.     Cervical back: Normal range of motion and neck supple.  Lymphadenopathy:     Head:     Right side of head: No submental, submandibular, tonsillar or preauricular adenopathy.     Left side of head: No submental, submandibular, tonsillar or preauricular adenopathy.     Cervical: No cervical adenopathy.     Right cervical: No superficial cervical adenopathy.    Left cervical: No superficial cervical adenopathy.     Upper Body:     Right upper body: No supraclavicular adenopathy.     Left upper body: No supraclavicular adenopathy.  Skin:    General: Skin is warm and dry.  Neurological:     General: No focal deficit present.     Mental Status: She is alert and oriented to person, place, and time.  Psychiatric:        Mood and Affect: Mood normal.        Behavior: Behavior normal.        Thought Content: Thought content normal.        Judgment: Judgment normal.       Assessment &  Plan:    Problem List Items Addressed This Visit       Nervous and Auditory   Eustachian tube dysfunction   Other Visit Diagnoses     Right otitis media, unspecified otitis media type    -  Primary   Relevant Medications   doxycycline (VIBRA-TABS) 100 MG tablet        Meds ordered this encounter  Medications   doxycycline (VIBRA-TABS) 100 MG tablet    Sig: Take 1 tablet (100 mg total) by mouth 2 (two) times daily for 7 days.    Dispense:  14 tablet    Refill:  0   1. Right otitis media, unspecified otitis media type 2. Dysfunction of both eustachian tubes Reassured no obvious lymphadenopathy.  Please continue the prednisone as prescribed.  Add doxycycline twice daily for 5-7 days. Use nasal saline several times daily and Flonase once daily. Humidifier at night. Push fluids. Recheck prn.   Brennden Masten M Kabe Mckoy, PA-C

## 2021-07-13 NOTE — Patient Instructions (Signed)
Please continue the prednisone as prescribed.  Add doxycycline twice daily for 5-7 days. Use nasal saline several times daily and Flonase once daily. Humidifier at night. Push fluids.

## 2021-07-19 ENCOUNTER — Other Ambulatory Visit: Payer: Self-pay

## 2021-07-19 ENCOUNTER — Telehealth: Payer: Self-pay | Admitting: Family Medicine

## 2021-07-19 ENCOUNTER — Encounter: Payer: Self-pay | Admitting: Family Medicine

## 2021-07-19 ENCOUNTER — Ambulatory Visit (INDEPENDENT_AMBULATORY_CARE_PROVIDER_SITE_OTHER): Payer: BC Managed Care – PPO | Admitting: Family Medicine

## 2021-07-19 VITALS — BP 122/90 | HR 114 | Temp 96.8°F | Ht 60.0 in | Wt 144.0 lb

## 2021-07-19 DIAGNOSIS — E538 Deficiency of other specified B group vitamins: Secondary | ICD-10-CM

## 2021-07-19 DIAGNOSIS — K51011 Ulcerative (chronic) pancolitis with rectal bleeding: Secondary | ICD-10-CM

## 2021-07-19 DIAGNOSIS — K754 Autoimmune hepatitis: Secondary | ICD-10-CM

## 2021-07-19 DIAGNOSIS — E782 Mixed hyperlipidemia: Secondary | ICD-10-CM | POA: Diagnosis not present

## 2021-07-19 DIAGNOSIS — E039 Hypothyroidism, unspecified: Secondary | ICD-10-CM | POA: Diagnosis not present

## 2021-07-19 DIAGNOSIS — M2550 Pain in unspecified joint: Secondary | ICD-10-CM | POA: Insufficient documentation

## 2021-07-19 DIAGNOSIS — Z0001 Encounter for general adult medical examination with abnormal findings: Secondary | ICD-10-CM

## 2021-07-19 DIAGNOSIS — J4540 Moderate persistent asthma, uncomplicated: Secondary | ICD-10-CM

## 2021-07-19 DIAGNOSIS — D849 Immunodeficiency, unspecified: Secondary | ICD-10-CM | POA: Insufficient documentation

## 2021-07-19 LAB — LIPID PANEL
Cholesterol: 209 mg/dL — ABNORMAL HIGH (ref 0–200)
HDL: 93 mg/dL (ref >39.00)
LDL Cholesterol: 100 mg/dL — ABNORMAL HIGH (ref 0–99)
NonHDL: 115.9
Total CHOL/HDL Ratio: 2
Triglycerides: 78 mg/dL (ref 0.0–149.0)
VLDL: 15.6 mg/dL (ref 0.0–40.0)

## 2021-07-19 LAB — CBC
HCT: 38.8 % (ref 36.0–46.0)
Hemoglobin: 12.9 g/dL (ref 12.0–15.0)
MCHC: 33.3 g/dL (ref 30.0–36.0)
MCV: 88.9 fl (ref 78.0–100.0)
Platelets: 239 10*3/uL (ref 150.0–400.0)
RBC: 4.36 Mil/uL (ref 3.87–5.11)
RDW: 13.1 % (ref 11.5–15.5)
WBC: 5.5 10*3/uL (ref 4.0–10.5)

## 2021-07-19 LAB — COMPREHENSIVE METABOLIC PANEL
ALT: 17 U/L (ref 0–35)
AST: 26 U/L (ref 0–37)
Albumin: 4 g/dL (ref 3.5–5.2)
Alkaline Phosphatase: 57 U/L (ref 39–117)
BUN: 16 mg/dL (ref 6–23)
CO2: 26 mEq/L (ref 19–32)
Calcium: 9.3 mg/dL (ref 8.4–10.5)
Chloride: 104 mEq/L (ref 96–112)
Creatinine, Ser: 0.76 mg/dL (ref 0.40–1.20)
GFR: 85 mL/min (ref >60.00)
Glucose, Bld: 99 mg/dL (ref 70–99)
Potassium: 3.8 mEq/L (ref 3.5–5.1)
Sodium: 139 mEq/L (ref 135–145)
Total Bilirubin: 0.9 mg/dL (ref 0.2–1.2)
Total Protein: 7.6 g/dL (ref 6.0–8.3)

## 2021-07-19 LAB — VITAMIN B12: Vitamin B-12: 779 pg/mL (ref 211–911)

## 2021-07-19 LAB — MAGNESIUM: Magnesium: 2.1 mg/dL (ref 1.5–2.5)

## 2021-07-19 NOTE — Assessment & Plan Note (Signed)
Concern for possible autoimmune arthritis.  She follows with rheumatology.  She will follow-up with them  soon.  Humira does seem to help.

## 2021-07-19 NOTE — Assessment & Plan Note (Signed)
Check labs today.  Continue Humira per GI.

## 2021-07-19 NOTE — Assessment & Plan Note (Signed)
On Crestor 10 mg daily. Check labs.

## 2021-07-19 NOTE — Assessment & Plan Note (Signed)
Follows with endocrinology.  Last TSH at goal.

## 2021-07-19 NOTE — Telephone Encounter (Signed)
I have corrected the dictation error. Thanks! -CMP

## 2021-07-19 NOTE — Assessment & Plan Note (Signed)
Up-to-date on vaccines aside from Shingrix.  She will check with her rheumatologist about best time to have this done.

## 2021-07-19 NOTE — Assessment & Plan Note (Signed)
Check CBC and c-Met.  Continue management per GI.

## 2021-07-19 NOTE — Patient Instructions (Addendum)
It was very nice to see you today!  We will check blood work.  Please let me know if your ear does not continue to improve.  Will continue work on diet and exercise.  We will see you back in 1 year for your next physical.  Please come back to see [name] sooner if needed.  Take care, Dr [name]  PLEASE NOTE:  If you had any lab tests please let us know if you have not heard back within a few days. You may see your results on mychart before we have a chance to review them but we will give you a call once they are reviewed by [name]. If we ordered any referrals today, please let us know if you have not heard from their office within the next week.   Please try these tips to maintain a healthy lifestyle:  Eat at least 3 REAL meals and 1-2 snacks per day.  Aim for no more than 5 hours between eating.  If you eat breakfast, please do so within one hour of getting up.   Each meal should contain half fruits/vegetables, one quarter protein, and one quarter carbs (no bigger than a computer mouse)  Cut down on sweet beverages. This includes juice, soda, and sweet tea.   Drink at least 1 glass of water with each meal and aim for at least 8 glasses per day  Exercise at least 150 minutes every week.    Preventive Care 61-78 Years Old, Female Preventive care refers to lifestyle choices and visits with your health care provider that can promote health and wellness. Preventive care visits are also called wellness exams. What can I expect for my preventive care visit? Counseling Your health care provider may ask you questions about your: Medical history, including: Past medical problems. Family medical history. Pregnancy history. Current health, including: Menstrual cycle. Method of birth control. Emotional well-being. Home life and relationship well-being. Sexual activity and sexual health. Lifestyle, including: Alcohol, nicotine or tobacco, and drug use. Access to firearms. Diet, exercise, and  sleep habits. Work and work Statistician. Sunscreen use. Safety issues such as seatbelt and bike helmet use. Physical exam Your health care provider will check your: Height and weight. These may be used to calculate your BMI (body mass index). BMI is a measurement that tells if you are at a healthy weight. Waist circumference. This measures the distance around your waistline. This measurement also tells if you are at a healthy weight and may help predict your risk of certain diseases, such as type 2 diabetes and high blood pressure. Heart rate and blood pressure. Body temperature. Skin for abnormal spots. What immunizations do I need? Vaccines are usually given at various ages, according to a schedule. Your health care provider will recommend vaccines for you based on your age, medical history, and lifestyle or other factors, such as travel or where you work. What tests do I need? Screening Your health care provider may recommend screening tests for certain conditions. This may include: Lipid and cholesterol levels. Diabetes screening. This is done by checking your blood sugar (glucose) after you have not eaten for a while (fasting). Pelvic exam and Pap test. Hepatitis B test. Hepatitis C test. HIV (human immunodeficiency virus) test. STI (sexually transmitted infection) testing, if you are at risk. Lung cancer screening. Colorectal cancer screening. Mammogram. Talk with your health care provider about when you should start having regular mammograms. This may depend on whether you have a family history of breast cancer.  BRCA-related cancer screening. This may be done if you have a family history of breast, ovarian, tubal, or peritoneal cancers. Bone density scan. This is done to screen for osteoporosis. Talk with your health care provider about your test results, treatment options, and if necessary, the need for more tests. Follow these instructions at home: Eating and drinking  Eat a  diet that includes fresh fruits and vegetables, whole grains, lean protein, and low-fat dairy products. Take vitamin and mineral supplements as recommended by your health care provider. Do not drink alcohol if: Your health care provider tells you not to drink. You are pregnant, may be pregnant, or are planning to become pregnant. If you drink alcohol: Limit how much you have to 0-1 drink a day. Know how much alcohol is in your drink. In the [place] one drink equals one 12 oz bottle of beer (355 mL), one 5 oz glass of wine (148 mL), or one 1 oz glass of hard liquor (44 mL). Lifestyle Brush your teeth every morning and night with fluoride toothpaste. Floss one time each day. Exercise for at least 30 minutes 5 or more days each week. Do not use any products that contain nicotine or tobacco. These products include cigarettes, chewing tobacco, and vaping devices, such as e-cigarettes. If you need help quitting, ask your health care provider. Do not use drugs. If you are sexually active, practice safe sex. Use a condom or other form of protection to prevent STIs. If you do not wish to become pregnant, use a form of birth control. If you plan to become pregnant, see your health care provider for a prepregnancy visit. Take aspirin only as told by your health care provider. Make sure that you understand how much to take and what form to take. Work with your health care provider to find out whether it is safe and beneficial for you to take aspirin daily. Find healthy ways to manage stress, such as: Meditation, yoga, or listening to music. Journaling. Talking to a trusted person. Spending time with friends and family. Minimize exposure to UV radiation to reduce your risk of skin cancer. Safety Always wear your seat belt while driving or riding in a vehicle. Do not drive: If you have been drinking alcohol. Do not ride with someone who has been drinking. When you are tired or distracted. While  texting. If you have been using any mind-altering substances or drugs. Wear a helmet and other protective equipment during sports activities. If you have firearms in your house, make sure you follow all gun safety procedures. Seek help if you have been physically or sexually abused. What's next? Visit your health care provider once a year for an annual wellness visit. Ask your health care provider how often you should have your eyes and teeth checked. Stay up to date on all vaccines. This information is not intended to replace advice given to you by your health care provider. Make sure you discuss any questions you have with your health care provider. Document Revised: 01/04/2021 Document Reviewed: 01/04/2021 Elsevier Patient Education  [employer].

## 2021-07-19 NOTE — Progress Notes (Signed)
Chief Complaint:  Danielle Bond is a 60 y.o. female who presents today for her annual comprehensive physical exam.    Assessment/Plan:  Chronic Problems Addressed Today: Autoimmune hepatitis Check CBC and c-Met.  Continue management per GI.  Ulcerative colitis Check labs today.  Continue Humira per GI.   Immunosuppressed status ([place]) Up-to-date on vaccines aside from Shingrix.  She will check with her rheumatologist about best time to have this done.  Arthralgia Concern for possible autoimmune arthritis.  She follows with rheumatology.  She will follow-up with them  soon.  Humira does seem to help.  Mixed hyperlipidemia On Crestor 10 mg daily. Check labs.   Hypothyroidism s/p total thyroidectomy 1997 Follows with endocrinology.  Last TSH at goal.   Body mass index is 28.12 kg/m. / Overweight  BMI Metric Follow Up - 07/19/21 0855       BMI Metric Follow Up-Please document annually   BMI Metric Follow Up Education provided              Preventative Healthcare: Check labs. UTD on mammogram and pap - will obtain records.   Patient Counseling(The following topics were reviewed and/or handout was given):  -Nutrition: Stressed importance of moderation in sodium/caffeine intake, saturated fat and cholesterol, caloric balance, sufficient intake of fresh fruits, vegetables, and fiber.  -Stressed the importance of regular exercise.   -Substance Abuse: Discussed cessation/primary prevention of tobacco, alcohol, or other drug use; driving or other dangerous activities under the influence; availability of treatment for abuse.   -Injury prevention: Discussed safety belts, safety helmets, smoke detector, smoking near bedding or upholstery.   -Sexuality: Discussed sexually transmitted diseases, partner selection, use of condoms, avoidance of unintended pregnancy and contraceptive alternatives.   -Dental health: Discussed importance of regular tooth brushing, flossing, and dental  visits.  -Health maintenance and immunizations reviewed. Please refer to Health maintenance section.  Return to care in 1 year for next preventative visit.     Subjective:  HPI:  She has no acute complaints today. See A/p for status of chronic conditions.   She recently was diagnosed with otitis media.  Started on doxycycline.  Symptoms seem to be improving.  Still has some hand pain related to general inflammation.  She will be following up with rheumatology soon.  Lifestyle Diet: Balanced.  Exercise: Goes to gym regularly three times per week.   Depression screen [facility] 2/9 07/19/2021  Decreased Interest 0  Down, Depressed, Hopeless 0  PHQ - 2 Score 0  Altered sleeping -  Tired, decreased energy -  Change in appetite -  Feeling bad or failure about yourself  -  Trouble concentrating -  Moving slowly or fidgety/restless -  Suicidal thoughts -  PHQ-9 Score -  Some recent data might be hidden    Health Maintenance Due  Topic Date Due   URINE MICROALBUMIN  Never done   Zoster Vaccines- Shingrix (1 of 2) Never done   COVID-19 Vaccine (4 - Booster for Moderna series) 07/18/2020   PAP SMEAR-Modifier  10/22/2020   Pneumococcal Vaccine 77-69 Years old (3 - PCV) 05/24/2021     ROS: Per HPI, otherwise a complete review of systems was negative.   PMH:  The following were reviewed and entered/updated in epic: Past Medical History:  Diagnosis Date   Asthma    Chronic kidney disease    Hx of pancreatitis 06/16/2018   Kidney stone    Kidney stone    Migraine    Orthostatic hypotension  Osteopenia 03/31/2012   Recurrent upper respiratory infection (URI)    Ulcerative colitis ([place]) 03/31/2012   Urticaria    Patient Active Problem List   Diagnosis Date Noted   Arthralgia 07/19/2021   Immunosuppressed status ([place]) 07/19/2021   Eustachian tube dysfunction 06/13/2021   Osteoporosis 07/11/2020   Atherosclerosis of native coronary artery of native heart without angina pectoris  06/08/2019   Aortic atherosclerosis ([place]) 06/08/2019   Mixed hyperlipidemia 05/02/2019   Postural dizziness with presyncope 05/02/2019   HSV-2 (herpes simplex virus 2) infection 11/04/2018   Hypothyroidism s/p total thyroidectomy 1997 08/15/2018   Autoimmune hepatitis ([place]) 08/16/2012   Asthma 03/31/2012   Ulcerative colitis ([place]) 03/31/2012   History of renal calculi 03/31/2012   Migraines 03/31/2012   Mitral valve prolapse    Past Surgical History:  Procedure Laterality Date   CESAREAN SECTION     x3   CHOLECYSTECTOMY     COLONOSCOPY     LITHOTRIPSY     THYROIDECTOMY     1997    Family History  Problem Relation Age of Onset   Cervical cancer Paternal Aunt    Heart disease Paternal Grandmother    Heart disease Mother    Dementia Mother    Thyroid disease Mother    Osteoarthritis Mother    Asthma Mother    Heart attack Father    Breast cancer Cousin    Heart attack Brother    Heart disease Brother     Medications- reviewed and updated Current Outpatient Medications  Medication Sig Dispense Refill   albuterol (PROVENTIL) (2.5 MG/3ML) 0.083% nebulizer solution Take 3 mLs (2.5 mg total) by nebulization every 6 (six) hours as needed for wheezing or shortness of breath. 150 mL 1   albuterol (VENTOLIN HFA) 108 (90 Base) MCG/ACT inhaler TAKE 2 PUFFS BY MOUTH EVERY 6 HOURS AS NEEDED FOR WHEEZE OR SHORTNESS OF BREATH 25.5 each 1   budesonide-formoterol (SYMBICORT) 160-4.5 MCG/ACT inhaler Inhale 2 puffs into the lungs in the morning and at bedtime. 1 each 12   cholecalciferol (VITAMIN D3) 25 MCG (1000 UNIT) tablet Take 50,000 Units by mouth.     clotrimazole-betamethasone (LOTRISONE) cream Apply 1 application topically 2 (two) times daily. 30 g 0   doxycycline (VIBRA-TABS) 100 MG tablet Take 1 tablet (100 mg total) by mouth 2 (two) times daily for 7 days. 14 tablet 0   EPINEPHRINE 0.3 mg/0.3 mL IJ SOAJ injection INJECT 0.3 MLS (0.3 MG TOTAL) INTO THE MUSCLE AS NEEDED FOR  ANAPHYLAXIS. 2 each 1   fluticasone (FLONASE) 50 MCG/ACT nasal spray PLACE 2 SPRAYS INTO BOTH NOSTRILS DAILY AS NEEDED FOR ALLERGIES OR RHINITIS. 48 mL 1   folic acid (FOLVITE) 1 MG tablet Take 1 mg by mouth daily.     HUMIRA PEN 40 MG/0.4ML PNKT every 14 (fourteen) days.      levothyroxine (SYNTHROID, LEVOTHROID) 112 MCG tablet Take 112 mcg by mouth daily before breakfast.     Magnesium 300 MG CAPS Take 500 mg by mouth daily.     Multiple Vitamin (MULTIVITAMIN WITH MINERALS) TABS tablet Take 1 tablet by mouth daily.     pantoprazole (PROTONIX) 40 MG tablet Take 40 mg by mouth daily.     predniSONE (STERAPRED UNI-PAK 21 TAB) 5 MG (21) TBPK tablet TAKE 6 TABLETS ON DAY 1 AS DIRECTED ON PACKAGE AND DECREASE BY 1 TAB EACH DAY FOR A TOTAL OF 6 DAYS     Riboflavin 100 MG CAPS Take by mouth.  rosuvastatin (CRESTOR) 10 MG tablet TAKE 1 TABLET BY MOUTH EVERY DAY 90 tablet 3   valACYclovir (VALTREX) 500 MG tablet Take 1 tablet by mouth as needed.     vitamin C (ASCORBIC ACID) 500 MG tablet Take 500 mg by mouth daily.     triamcinolone cream (KENALOG) 0.1 % Apply 1 application topically daily as needed. (Patient not taking: Reported on 07/19/2021) 453 g 6   No current facility-administered medications for this visit.    Allergies-reviewed and updated Allergies  Allergen Reactions   Almond Oil Hives and Shortness Of Breath   Lialda [Mesalamine] Other (See Comments)    Vomiting and rectal bleeding   Other Anaphylaxis    Almonds, tree nuts   Aspirin Hives and Nausea Only    Stomach cramps   Hydromorphone Hcl Other (See Comments)    Caused Enzyme levels to go up   Penicillins Hives and Swelling    Has patient had a PCN reaction causing immediate rash, facial/tongue/throat swelling, SOB or lightheadedness with hypotension: Yes Has patient had a PCN reaction causing severe rash involving mucus membranes or skin necrosis: No Has patient had a PCN reaction that required hospitalization: No Has  patient had a PCN reaction occurring within the last 10 years: No  Stomach cramps If all of the above answers are "NO", then may proceed with Cephalosporin use.    Acetaminophen Other (See Comments)    Has autoimmune hepatitis, wants to avoid APAP   Apriso [Mesalamine Er] Nausea And Vomiting   Corticosteroids Other (See Comments)    Had acute pancreatitis x 2 after steroid use.   Imitrex [Sumatriptan] Swelling    Injection caused swelling   Stadol [Butorphanol] Other (See Comments)    hallucinates   Topamax [Topiramate] Other (See Comments)    Kidney stones   Vancomycin Hives   Azathioprine Rash   Pepcid [Famotidine] Nausea And Vomiting    Makes acid worse   Prednisone Nausea And Vomiting    All steroids causes nausea and pancreatitis     Social History   Socioeconomic History   Marital status: Married    Spouse name: Not on file   Number of children: 2   Years of education: some college   Highest education level: Not on file  Occupational History   Occupation: Self-employed  Tobacco Use   Smoking status: Never   Smokeless tobacco: Never  Vaping Use   Vaping Use: Never used  Substance and Sexual Activity   Alcohol use: No   Drug use: No   Sexual activity: Yes  Other Topics Concern   Not on file  Social History Narrative   Lives at home with her husband.   Right-handed.   No caffeine use.   Social Determinants of Health   Financial Resource Strain: Not on file  Food Insecurity: Not on file  Transportation Needs: Not on file  Physical Activity: Not on file  Stress: Not on file  Social Connections: Not on file        Objective:  Physical Exam: BP 122/90 (BP Location: Left Arm)    Pulse (!) 114    Temp (!) 96.8 F (36 C) (Temporal)    Ht 5' (1.524 m)    Wt 144 lb (65.3 kg)    LMP 03/03/2013    SpO2 98%    BMI 28.12 kg/m   Body mass index is 28.12 kg/m. Wt Readings from Last 3 Encounters:  07/19/21 144 lb (65.3 kg)  07/13/21 147 lb 3.2  oz (66.8 kg)   06/27/21 147 lb 6.4 oz (66.9 kg)   Gen: NAD, resting comfortably HEENT: Right TM erythematous.  Left TM clear.  OP clear. No thyromegaly noted.  CV: RRR with no murmurs appreciated Pulm: NWOB, CTAB with no crackles, wheezes, or rhonchi GI: Normal bowel sounds present. Soft, Nontender, Nondistended. MSK: no edema, cyanosis, or clubbing noted Skin: warm, dry Neuro: CN2-12 grossly intact. Strength 5/5 in upper and lower extremities. Reflexes symmetric and intact bilaterally.  Psych: Normal affect and thought content     Nicolina Hirt M. [name], MD 07/19/2021 8:58 AM

## 2021-07-20 ENCOUNTER — Telehealth: Payer: Self-pay | Admitting: Adult Health

## 2021-07-20 LAB — IRON,TIBC AND FERRITIN PANEL
%SAT: 34 % (calc) (ref 16–45)
Ferritin: 56 ng/mL (ref 16–232)
Iron: 121 ug/dL (ref 45–160)
TIBC: 359 mcg/dL (calc) (ref 250–450)

## 2021-07-20 MED ORDER — BUDESONIDE-FORMOTEROL FUMARATE 160-4.5 MCG/ACT IN AERO
2.0000 | INHALATION_SPRAY | Freq: Two times a day (BID) | RESPIRATORY_TRACT | 12 refills | Status: DC
Start: 2021-07-20 — End: 2022-06-22

## 2021-07-20 NOTE — Telephone Encounter (Signed)
Symbicort Rx sent to preferred pharmacy. Left detailed message for mask (okay per DPR). Nothing further needed at this time.

## 2021-07-20 NOTE — Progress Notes (Signed)
Please inform patient of the following:  Cholesterol is borderline but stable. Everything else is NORMAL. Do not need to make any changes to her treatment plan at this time. We can recheck in  year or so.  [name]. [name], MD 07/20/2021 1:42 PM

## 2021-07-21 DIAGNOSIS — R768 Other specified abnormal immunological findings in serum: Secondary | ICD-10-CM | POA: Diagnosis not present

## 2021-07-23 DIAGNOSIS — K760 Fatty (change of) liver, not elsewhere classified: Secondary | ICD-10-CM

## 2021-07-23 HISTORY — DX: Fatty (change of) liver, not elsewhere classified: K76.0

## 2021-07-25 DIAGNOSIS — M79643 Pain in unspecified hand: Secondary | ICD-10-CM | POA: Diagnosis not present

## 2021-07-25 DIAGNOSIS — R768 Other specified abnormal immunological findings in serum: Secondary | ICD-10-CM | POA: Diagnosis not present

## 2021-07-25 DIAGNOSIS — M810 Age-related osteoporosis without current pathological fracture: Secondary | ICD-10-CM | POA: Diagnosis not present

## 2021-07-25 DIAGNOSIS — K5190 Ulcerative colitis, unspecified, without complications: Secondary | ICD-10-CM | POA: Diagnosis not present

## 2021-07-25 DIAGNOSIS — M79644 Pain in right finger(s): Secondary | ICD-10-CM | POA: Diagnosis not present

## 2021-08-01 ENCOUNTER — Other Ambulatory Visit: Payer: BC Managed Care – PPO

## 2021-08-09 ENCOUNTER — Ambulatory Visit
Admission: RE | Admit: 2021-08-09 | Discharge: 2021-08-09 | Disposition: A | Discharge status: Home / Self Care | Payer: BC Managed Care – PPO | Source: Ambulatory Visit | Attending: Endocrinology | Admitting: Endocrinology

## 2021-08-09 DIAGNOSIS — E039 Hypothyroidism, unspecified: Secondary | ICD-10-CM | POA: Diagnosis not present

## 2021-08-09 DIAGNOSIS — E890 Postprocedural hypothyroidism: Secondary | ICD-10-CM | POA: Diagnosis not present

## 2021-08-10 ENCOUNTER — Other Ambulatory Visit: Payer: Self-pay | Admitting: Family Medicine

## 2021-08-10 ENCOUNTER — Other Ambulatory Visit: Payer: Self-pay | Admitting: Cardiology

## 2021-08-21 ENCOUNTER — Emergency Department (HOSPITAL_BASED_OUTPATIENT_CLINIC_OR_DEPARTMENT_OTHER)
Admission: EM | Admit: 2021-08-21 | Discharge: 2021-08-21 | Disposition: A | Discharge status: Home / Self Care | Payer: BC Managed Care – PPO | Attending: Emergency Medicine | Admitting: Emergency Medicine

## 2021-08-21 ENCOUNTER — Encounter (HOSPITAL_BASED_OUTPATIENT_CLINIC_OR_DEPARTMENT_OTHER): Payer: Self-pay | Admitting: Urology

## 2021-08-21 ENCOUNTER — Other Ambulatory Visit: Payer: Self-pay

## 2021-08-21 DIAGNOSIS — M1380 Other specified arthritis, unspecified site: Secondary | ICD-10-CM

## 2021-08-21 DIAGNOSIS — Z7951 Long term (current) use of inhaled steroids: Secondary | ICD-10-CM | POA: Diagnosis not present

## 2021-08-21 DIAGNOSIS — J45909 Unspecified asthma, uncomplicated: Secondary | ICD-10-CM | POA: Insufficient documentation

## 2021-08-21 DIAGNOSIS — H60501 Unspecified acute noninfective otitis externa, right ear: Secondary | ICD-10-CM | POA: Diagnosis not present

## 2021-08-21 DIAGNOSIS — M0840 Pauciarticular juvenile rheumatoid arthritis, unspecified site: Secondary | ICD-10-CM | POA: Insufficient documentation

## 2021-08-21 DIAGNOSIS — N189 Chronic kidney disease, unspecified: Secondary | ICD-10-CM | POA: Insufficient documentation

## 2021-08-21 DIAGNOSIS — Z20822 Contact with and (suspected) exposure to covid-19: Secondary | ICD-10-CM | POA: Insufficient documentation

## 2021-08-21 DIAGNOSIS — H6091 Unspecified otitis externa, right ear: Secondary | ICD-10-CM | POA: Diagnosis not present

## 2021-08-21 DIAGNOSIS — R9431 Abnormal electrocardiogram [ECG] [EKG]: Secondary | ICD-10-CM | POA: Diagnosis not present

## 2021-08-21 DIAGNOSIS — H9201 Otalgia, right ear: Secondary | ICD-10-CM | POA: Diagnosis not present

## 2021-08-21 LAB — CBC WITH DIFFERENTIAL/PLATELET
Abs Immature Granulocytes: 0.01 10*3/uL (ref 0.00–0.07)
Basophils Absolute: 0 10*3/uL (ref 0.0–0.1)
Basophils Relative: 1 %
Eosinophils Absolute: 0.1 10*3/uL (ref 0.0–0.5)
Eosinophils Relative: 1 %
HCT: 40.9 % (ref 36.0–46.0)
Hemoglobin: 13.3 g/dL (ref 12.0–15.0)
Immature Granulocytes: 0 %
Lymphocytes Relative: 28 %
Lymphs Abs: 1.6 10*3/uL (ref 0.7–4.0)
MCH: 28.7 pg (ref 26.0–34.0)
MCHC: 32.5 g/dL (ref 30.0–36.0)
MCV: 88.3 fL (ref 80.0–100.0)
Monocytes Absolute: 0.5 10*3/uL (ref 0.1–1.0)
Monocytes Relative: 9 %
Neutro Abs: 3.6 10*3/uL (ref 1.7–7.7)
Neutrophils Relative %: 61 %
Platelets: 238 10*3/uL (ref 150–400)
RBC: 4.63 MIL/uL (ref 3.87–5.11)
RDW: 12.9 % (ref 11.5–15.5)
WBC: 5.9 10*3/uL (ref 4.0–10.5)
nRBC: 0 % (ref 0.0–0.2)

## 2021-08-21 LAB — COMPREHENSIVE METABOLIC PANEL
ALT: 14 U/L (ref 0–44)
AST: 25 U/L (ref 15–41)
Albumin: 4.2 g/dL (ref 3.5–5.0)
Alkaline Phosphatase: 53 U/L (ref 38–126)
Anion gap: 10 (ref 5–15)
BUN: 17 mg/dL (ref 6–20)
CO2: 24 mmol/L (ref 22–32)
Calcium: 9.3 mg/dL (ref 8.9–10.3)
Chloride: 104 mmol/L (ref 98–111)
Creatinine, Ser: 0.78 mg/dL (ref 0.44–1.00)
GFR, Estimated: >60 mL/min (ref >60)
Glucose, Bld: 108 mg/dL — ABNORMAL HIGH (ref 70–99)
Potassium: 3.8 mmol/L (ref 3.5–5.1)
Sodium: 138 mmol/L (ref 135–145)
Total Bilirubin: 0.8 mg/dL (ref 0.3–1.2)
Total Protein: 8.3 g/dL — ABNORMAL HIGH (ref 6.5–8.1)

## 2021-08-21 LAB — RESP PANEL BY RT-PCR (FLU A&B, COVID) ARPGX2
Influenza A by PCR: NEGATIVE
Influenza B by PCR: NEGATIVE
SARS Coronavirus 2 by RT PCR: NEGATIVE

## 2021-08-21 LAB — D-DIMER, QUANTITATIVE: D-Dimer, Quant: 1.75 ug/mL-FEU — ABNORMAL HIGH (ref 0.00–0.50)

## 2021-08-21 LAB — C-REACTIVE PROTEIN: CRP: 0.8 mg/dL (ref <1.0)

## 2021-08-21 LAB — SEDIMENTATION RATE: Sed Rate: 53 mm/hr — ABNORMAL HIGH (ref 0–22)

## 2021-08-21 MED ORDER — LACTATED RINGERS IV BOLUS
1000.0000 mL | Freq: Once | INTRAVENOUS | Status: AC
  Administered 2021-08-21: 1000 mL via INTRAVENOUS

## 2021-08-21 MED ORDER — OFLOXACIN 0.3 % OT SOLN: 5.0000 [drp] | Freq: Two times a day (BID) | OTIC | 0 refills | Status: AC

## 2021-08-21 MED ORDER — CEFUROXIME AXETIL 500 MG PO TABS: 500.0000 mg | ORAL_TABLET | Freq: Two times a day (BID) | ORAL | 0 refills | Status: DC

## 2021-08-21 MED ORDER — CIPROFLOXACIN HCL 500 MG PO TABS: 500.0000 mg | ORAL_TABLET | Freq: Two times a day (BID) | ORAL | 0 refills | Status: DC

## 2021-08-21 MED ORDER — NAPROXEN 500 MG PO TABS: 500.0000 mg | ORAL_TABLET | Freq: Two times a day (BID) | ORAL | 0 refills | Status: DC

## 2021-08-21 NOTE — Discharge Instructions (Addendum)
You were evaluated in the Emergency Department and after careful evaluation, we did not find any emergent condition requiring admission or further testing in the hospital.  Your exam/testing today was was concerning for a likely arthritis flare which appears to be acute on chronic.  We will treat with high-dose NSAIDs.  Recommend you follow-up with a rheumatologist outpatient.  Low concern for acute ulcerative colitis flare at this time.  You had intermittent episodes of tachycardia which appear to have resolved with fluids.  No evidence of SVT on EKG captured today. Will treat your ear infection with antibiotics.  Please return to the Emergency Department if you experience any worsening of your condition.  Thank you for allowing [name] to be a part of your care.

## 2021-08-21 NOTE — ED Provider Notes (Addendum)
[facility] EMERGENCY DEPT Provider Note   CSN: [id number] Arrival date & time: 08/21/21  1430     History  Chief Complaint  Patient presents with   Joint Inflammation    Danielle Bond is a 61 y.o. female.  HPI  61 year old female with history of asthma, CKD, migraine headaches, ulcerative colitis on immunosuppression, nephrolithiasis, pancreatitis who presents to the emergency department with a chief complaint of acute on chronic joint inflammation.  She states that she has had joint tenderness and pain in her hands and feet intermittently since November 2022.  She is on Humira for ulcerative colitis.  She denies any fevers or chills.  She denies any concern for ulcerative colitis flare with no bloody bowel movements, no increasing frequency of bowel movements.  No abdominal pain.  She denies any chest pain or shortness of breath.  Additionally, the patient reports right-sided ear pain, no hearing loss, no fevers or other complaints.  She is concerned for an ear infection.  Home Medications Prior to Admission medications   Medication Sig Start Date End Date Taking? Authorizing Provider  ofloxacin (FLOXIN) 0.3 % OTIC solution Place 5 drops into the right ear 2 (two) times daily for 7 days. 08/21/21 08/28/21 Yes Regan Lemming, MD  albuterol (PROVENTIL) (2.5 MG/3ML) 0.083% nebulizer solution Take 3 mLs (2.5 mg total) by nebulization every 6 (six) hours as needed for wheezing or shortness of breath. 06/22/21   [name], MD  albuterol (VENTOLIN HFA) 108 (90 Base) MCG/ACT inhaler TAKE 2 PUFFS BY MOUTH EVERY 6 HOURS AS NEEDED FOR WHEEZE OR SHORTNESS OF BREATH 06/13/21   [name], MD  azithromycin [facility]) 250 MG tablet Take 2 tabs day 1, then 1 tab daily 08/23/21   [name], MD  budesonide-formoterol [facility]) 160-4.5 MCG/ACT inhaler Inhale 2 puffs into the lungs in the morning and at bedtime. 07/20/21   Parrett, Fonnie Mu, NP  cholecalciferol (VITAMIN D3)  25 MCG (1000 UNIT) tablet Take 50,000 Units by mouth.    [provider]  EPINEPHRINE 0.3 mg/0.3 mL IJ SOAJ injection INJECT 0.3 MLS (0.3 MG TOTAL) INTO THE MUSCLE AS NEEDED FOR ANAPHYLAXIS. 04/24/21   [name], MD  fluticasone (FLONASE) 50 MCG/ACT nasal spray PLACE 2 SPRAYS INTO BOTH NOSTRILS DAILY AS NEEDED FOR ALLERGIES OR RHINITIS. 08/10/21   [name], MD  folic acid (FOLVITE) 1 MG tablet Take 1 mg by mouth daily.    [provider]  HUMIRA PEN 40 MG/0.4ML PNKT every 14 (fourteen) days.  10/23/18   [provider]  levothyroxine (SYNTHROID, LEVOTHROID) 112 MCG tablet Take 112 mcg by mouth daily before breakfast.    [provider]  Magnesium 300 MG CAPS Take 500 mg by mouth daily.    [provider]  Multiple Vitamin (MULTIVITAMIN WITH MINERALS) TABS tablet Take 1 tablet by mouth daily.    [provider]  pantoprazole (PROTONIX) 40 MG tablet Take 40 mg by mouth daily. 09/14/19   [provider]  predniSONE (STERAPRED UNI-PAK 21 TAB) 5 MG (21) TBPK tablet TAKE 6 TABLETS ON DAY 1 AS DIRECTED ON PACKAGE AND DECREASE BY 1 TAB EACH DAY FOR A TOTAL OF 6 DAYS Patient not taking: Reported on 08/23/2021 06/23/21   [provider]  Riboflavin 100 MG CAPS Take by mouth.    [provider]  rosuvastatin (CRESTOR) 10 MG tablet TAKE 1 TABLET BY MOUTH EVERY DAY 08/10/21   Patwardhan, Reynold Bowen, MD  triamcinolone  cream (KENALOG) 0.1 % Apply 1 application topically daily as needed. Patient not taking: Reported on 08/23/2021 03/29/21   Warren Danes, PA-C  valACYclovir (VALTREX) 500 MG tablet Take 1 tablet by mouth as needed.    [provider]  vitamin C (ASCORBIC ACID) 500 MG tablet Take 500 mg by mouth daily.    [provider]      Allergies    Almond oil, Lialda [mesalamine], Other, Aspirin, Hydromorphone hcl, Penicillins, Acetaminophen, Apriso [mesalamine er], Corticosteroids, Imitrex [sumatriptan],  Stadol [butorphanol], Topamax [topiramate], Vancomycin, Azathioprine, Pepcid [famotidine], and Prednisone    Review of Systems   Review of Systems  All other systems reviewed and are negative.  Physical Exam Updated Vital Signs BP (!) 162/92 (BP Location: Right Arm)    Pulse 98    Temp 98.1 F (36.7 C)    Resp 16    Ht 5' (1.524 m)    Wt 65.3 kg    LMP 03/03/2013    SpO2 98%    BMI 28.12 kg/m  Physical Exam Vitals and nursing note reviewed.  Constitutional:      General: She is not in acute distress.    Appearance: She is well-developed.  HENT:     Head: Normocephalic and atraumatic.     Right Ear: Tympanic membrane normal. No mastoid tenderness.     Left Ear: Tympanic membrane, ear canal and external ear normal. No mastoid tenderness.     Ears:     Comments: Right-sided otitis externa present with some discharge noted, erythema of the EAC.  No mastoid tenderness.  TM appears normal bilaterally Eyes:     Conjunctiva/sclera: Conjunctivae normal.     Pupils: Pupils are equal, round, and reactive to light.  Neck:     Vascular: No JVD.  Cardiovascular:     Rate and Rhythm: Normal rate and regular rhythm.     Heart sounds: No murmur heard. Pulmonary:     Effort: Pulmonary effort is normal. No respiratory distress.     Breath sounds: Normal breath sounds.  Abdominal:     General: There is no distension.     Palpations: Abdomen is soft.     Tenderness: There is no abdominal tenderness. There is no guarding.  Musculoskeletal:        General: No swelling, deformity or signs of injury.     Cervical back: Neck supple.     Right lower leg: No edema.     Left lower leg: No edema.     Comments: Bilateral tenderness to palpation of the metacarpals.  2+ radial pulses with no neurologic deficits.  No significant swelling appreciated of the hands or feet.  No unilateral swelling.  No calf tenderness, negative Homans' sign.  Skin:    General: Skin is warm and dry.     Capillary Refill:  Capillary refill takes less than 2 seconds.     Findings: No lesion or rash.  Neurological:     General: No focal deficit present.     Mental Status: She is alert. Mental status is at baseline.  Psychiatric:        Mood and Affect: Mood normal.    ED Results / Procedures / Treatments   Labs (all labs ordered are listed, but only abnormal results are displayed) Labs Reviewed  COMPREHENSIVE METABOLIC PANEL - Abnormal; Notable for the following components:      Result Value   Glucose, Bld 108 (*)    Total Protein 8.3 (*)  All other components within normal limits  SEDIMENTATION RATE - Abnormal; Notable for the following components:   Sed Rate 53 (*)    All other components within normal limits  D-DIMER, QUANTITATIVE - Abnormal; Notable for the following components:   D-Dimer, Quant 1.75 (*)    All other components within normal limits  RESP PANEL BY RT-PCR (FLU A&B, COVID) [id number]  CBC WITH DIFFERENTIAL/PLATELET  C-REACTIVE PROTEIN    EKG EKG Interpretation  Date/Time:  Monday August 21 2021 19:39:27 EST Ventricular Rate:  87 PR Interval:  162 QRS Duration: 82 QT Interval:  374 QTC Calculation: 450 R Axis:   51 Text Interpretation: Sinus rhythm Confirmed by Regan Lemming ([id number]) on 08/21/2021 7:48:42 PM  Radiology CT Angio Chest Pulmonary Embolism (PE) W or WO Contrast  Result Date: 08/23/2021 CLINICAL DATA:  Positive D-dimer EXAM: CT ANGIOGRAPHY CHEST WITH CONTRAST TECHNIQUE: Multidetector CT imaging of the chest was performed using the standard protocol during bolus administration of intravenous contrast. Multiplanar CT image reconstructions and MIPs were obtained to evaluate the vascular anatomy. RADIATION DOSE REDUCTION: This exam was performed according to the departmental dose-optimization program which includes automated exposure control, adjustment of the mA and/or kV according to patient size and/or use of iterative reconstruction technique. CONTRAST:  45m OMNIPAQUE  IOHEXOL 350 MG/ML SOLN COMPARISON:  06/15/2019 FINDINGS: Cardiovascular: Heart size upper limits normal. No pericardial effusion. The RV is nondilated. Satisfactory opacification of pulmonary arteries noted, and there is no evidence of pulmonary emboli. There is good contrast opacification of the thoracic aorta. No dissection or stenosis. Calcified atheromatous plaque in the arch. 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 Mild atheromatous plaque in the proximal visualized abdominal aorta which is nondilated. Mediastinum/Nodes: No mass or adenopathy. Surgical clips from thyroidectomy. Lungs/Pleura: No pleural effusion. Patchy poorly marginated ground-glass opacities predominantly in dependent aspect of both lung bases. Upper Abdomen: No acute findings. Musculoskeletal: No chest wall abnormality. No acute or significant osseous findings. Review of the MIP images confirms the above findings. IMPRESSION: 1.  No acute PE or thoracic aortic dissection. Electronically Signed   By: DLucrezia EuropeM.D.   On: 08/23/2021 12:59    Procedures Procedures    Medications Ordered in ED Medications  lactated ringers bolus 1,000 mL (0 mLs Intravenous Stopped 08/21/21 2031)    ED Course/ Medical Decision Making/ A&P                           Medical Decision Making Amount and/or Complexity of Data Reviewed Labs: ordered.  Risk Prescription drug management.   61year old female with history of asthma, CKD, migraine headaches, ulcerative colitis on immunosuppression, nephrolithiasis, pancreatitis who presents to the emergency department with a chief complaint of acute on chronic joint inflammation.  She states that she has had joint tenderness and pain in her hands and feet intermittently since November 2022.  She is on Humira for ulcerative colitis.  She denies any fevers or chills.  She denies any concern for ulcerative colitis flare with no bloody bowel movements, no increasing  frequency of bowel movements.  No abdominal pain.  She denies any chest pain or shortness of breath.  On arrival, the patient was stable.  Initially mildly tachycardic which resolved.  The patient denies any dyspnea.  She denies any chest pain or shortness of breath.  Her chief complaint is of inflammation in multiple areas of her body and joints.  She denies any concerning  symptoms for an ulcerative colitis flare.  Her abdomen is soft, nontender, nondistended and I do not suspect ulcerative colitis flare at this time.  She endorses bilateral swelling in her knees and ankles and hands which is chronic.  She has previously been evaluated by rheumatologist outpatient for this.  She had a negative ANA and reportedly had a negative panel for RA.  Suspect likely arthritis associated with the patient's ulcerative colitis versus other undiagnosed arthritis.  Screening laboratory work-up was collected to include CRP, ESR, CBC and CMP.  CBC and CMP generally unremarkable.  ESR mildly elevated to 53, CRP normal.  COVID-19 and influenza PCR test was ordered and resulted negative.  A D-dimer was initially ordered because the patient had felt like there was possibly some asymmetry to her leg swelling and she had initially presented tachycardic but she had no asymmetry or edema noted on exam and the initial plan had been to cancel the lab.  Unfortunately it was not canceled prior to it resulting.  It did result positive. Suspect dimer elevated as an acute phase reactant in the setting of the patient's inflammation.  Given the patient's complete lack of symptoms concerning for PE and no unilateral swelling concerning for DVT, negative Homan's sign, I had a discussion with the patient about ordering further work-up with a venous ultrasound and CTA which the patient declined.  Very low suspicion for DVT and PE at this time.  No significant pain with range of motion, no palpable joint effusion, low concern for gout flare or septic  arthritis at this time.  She has no pitting edema at this time or JVD concerning for new onset CHF.  Lungs were clear to auscultation bilaterally.The patient's tachycardia resolved with IV fluid bolus.  The patient's symptoms are most concerning for an arthritis flare of unclear etiology.  The patient was recommended to follow-up outpatient with her rheumatologist for further work-up of this and provided with a referral as she was interested in a second opinion.  Additionally, the patient does complain of pain in her right ear and on exam was found to have otitis externa.  Given her immunosuppression on Humira, will treat with both otic drops and oral medication.  She was advised cefuroxime tablets and ofloxacin drops for treatment and advised follow-up with her PCP. Stable for discharge.    Final Clinical Impression(s) / ED Diagnoses Final diagnoses:  Oligoarthritis  Acute otitis externa of right ear, unspecified type    Rx / DC Orders ED Discharge Orders          Ordered    Ambulatory referral to Rheumatology        08/21/21 1946    naproxen (NAPROSYN) 500 MG tablet  2 times daily,   Status:  Discontinued        08/21/21 1948    ofloxacin (FLOXIN) 0.3 % OTIC solution  2 times daily        08/21/21 1956    ciprofloxacin (CIPRO) 500 MG tablet  Every 12 hours,   Status:  Discontinued        08/21/21 1958    cefUROXime (CEFTIN) 500 MG tablet  2 times daily with meals,   Status:  Discontinued        08/21/21 2031                  Regan Lemming, MD 08/23/21 1344    Regan Lemming, MD 08/23/21 1358

## 2021-08-21 NOTE — ED Triage Notes (Signed)
Starting November of 2022 Inflammation to multiple areas of body and joints Went to rheumatologist, negative tests  On Humira for UC, called gastroenterology and was sent here by them  No GI symtoms c/o pain and swelling to knees and lymphnodes

## 2021-08-22 ENCOUNTER — Telehealth: Payer: Self-pay | Admitting: Adult Health

## 2021-08-22 ENCOUNTER — Other Ambulatory Visit: Payer: Self-pay | Admitting: Student

## 2021-08-22 ENCOUNTER — Telehealth: Payer: Self-pay

## 2021-08-22 DIAGNOSIS — M7989 Other specified soft tissue disorders: Secondary | ICD-10-CM

## 2021-08-22 NOTE — Telephone Encounter (Signed)
I am happy to see her. But I reviewed ED visit and she also needs to be seen by PCP because she has non-cardiac issues going on as well. Please advise patient to schedule with PCP as well. I have also put lab order in, please have patient do BNP if possible today

## 2021-08-22 NOTE — Telephone Encounter (Signed)
Called pt, no answer. Left vm requesting call back?

## 2021-08-22 NOTE — Telephone Encounter (Signed)
Called and spoke to pt, pt voiced understanding. Pt will be going to get labs done today.

## 2021-08-22 NOTE — Telephone Encounter (Signed)
Lm x1 for patient.  

## 2021-08-22 NOTE — Telephone Encounter (Signed)
Pt went to the ER yesterday. He D-Dimer was positive and the hospital could not figure out why. Pts heart rate was 103 and kept going up. Pts ankles and feet have been swelling and she has welts rising on the skin. Pts BP was fluctuating. Pt has been scheduled to see [name] in office tomorrow, 08/23/2021, at 2:15 pm.

## 2021-08-22 NOTE — Telephone Encounter (Signed)
Spoke with the pt  She states that she was having increased "inflammation" over the past 3 wks  She went to ED 08/21/21  Her D Dimer was elevated at 1.75  She says that she was not having any CP or SOB  They did not order CTA  She says she is seeing her cardiologist and her PCP tomorrow  She is aware to seek emergent care in the meantime should she start having SOB/CP  Will forward to TP as FYI

## 2021-08-23 ENCOUNTER — Encounter (HOSPITAL_BASED_OUTPATIENT_CLINIC_OR_DEPARTMENT_OTHER): Payer: Self-pay

## 2021-08-23 ENCOUNTER — Other Ambulatory Visit: Payer: Self-pay

## 2021-08-23 ENCOUNTER — Telehealth: Payer: Self-pay | Admitting: Adult Health

## 2021-08-23 ENCOUNTER — Telehealth: Payer: Self-pay

## 2021-08-23 ENCOUNTER — Ambulatory Visit (HOSPITAL_BASED_OUTPATIENT_CLINIC_OR_DEPARTMENT_OTHER)
Admission: RE | Admit: 2021-08-23 | Discharge: 2021-08-23 | Disposition: A | Discharge status: Home / Self Care | Payer: BC Managed Care – PPO | Source: Ambulatory Visit | Attending: Family Medicine | Admitting: Family Medicine

## 2021-08-23 ENCOUNTER — Ambulatory Visit (INDEPENDENT_AMBULATORY_CARE_PROVIDER_SITE_OTHER): Payer: BC Managed Care – PPO | Admitting: Family Medicine

## 2021-08-23 ENCOUNTER — Encounter: Payer: Self-pay | Admitting: Family Medicine

## 2021-08-23 ENCOUNTER — Ambulatory Visit: Payer: BC Managed Care – PPO | Admitting: Student

## 2021-08-23 VITALS — BP 138/84 | HR 117 | Temp 97.7°F | Ht 60.0 in | Wt 146.8 lb

## 2021-08-23 DIAGNOSIS — R7989 Other specified abnormal findings of blood chemistry: Secondary | ICD-10-CM

## 2021-08-23 DIAGNOSIS — R079 Chest pain, unspecified: Secondary | ICD-10-CM

## 2021-08-23 DIAGNOSIS — M79605 Pain in left leg: Secondary | ICD-10-CM | POA: Diagnosis not present

## 2021-08-23 DIAGNOSIS — M79606 Pain in leg, unspecified: Secondary | ICD-10-CM | POA: Insufficient documentation

## 2021-08-23 LAB — BRAIN NATRIURETIC PEPTIDE: BNP: 9.5 pg/mL (ref 0.0–100.0)

## 2021-08-23 MED ORDER — IOHEXOL 350 MG/ML SOLN
75.0000 mL | Freq: Once | INTRAVENOUS | Status: AC | PRN
  Administered 2021-08-23: 75 mL via INTRAVENOUS

## 2021-08-23 MED ORDER — AZITHROMYCIN 250 MG PO TABS: ORAL_TABLET | ORAL | 0 refills | Status: DC

## 2021-08-23 NOTE — Telephone Encounter (Signed)
LOV was ~2 months ago.  Looked for ER notes, not available.  Tried to call patient , [id number]   Would definitely follow up with PCP /Cardiology tomorrow as planned.  Without seeing and knowing the hx , difficult to know exactly what is going on or next step with testing.  If she develops cp /sob or worsening sx go back to RE  Please contact office for sooner follow up if symptoms do not improve or worsen or seek emergency care    Glad to see her if she wants to come in the office.

## 2021-08-23 NOTE — Telephone Encounter (Signed)
Patient wanted to notify Dr. [name] that she has completed the stat orders that he has ordered.

## 2021-08-23 NOTE — Telephone Encounter (Signed)
Patient stated she had a CTA and [name] completed at [facility] location today. Patient wanted Tammy, NP to be aware.  Message routed to Bryant, NP as Juluis Rainier

## 2021-08-23 NOTE — Progress Notes (Deleted)
Patient referred by [name], MD for abnormal echocardiogram.  Subjective:   Danielle Bond, female    DOB: [birth year], 61 y.o.   MRN: [id number]   No chief complaint on file.   61 y.o. Caucasian female with hyperlipidemia, aortic and coronary atherrosclerosis, presyncope, Graves' disease, status post thyroidectomy, ulcerative colitis, h/o acute pancreatitis, migraine.     Patient presents for 6-week follow-up of shortness of breath, palpitations, and results of cardiac testing.  At last visit ordered nuclear stress test and cardiac monitor.  Stress test was low risk and results of cardiac monitor revealed no significant cardiac arrhythmia.  Patient continues to have occasional episodes of palpitations.  She is also had worsening sinus congestion and allergies recently which have contributed to shortness of breath.  He has had no recurrence of chest discomfort.Patient denies syncope, near syncope, orthopnea, PND, leg swelling.    Current Outpatient Medications on File Prior to Visit  Medication Sig Dispense Refill   albuterol (PROVENTIL) (2.5 MG/3ML) 0.083% nebulizer solution Take 3 mLs (2.5 mg total) by nebulization every 6 (six) hours as needed for wheezing or shortness of breath. 150 mL 1   albuterol (VENTOLIN HFA) 108 (90 Base) MCG/ACT inhaler TAKE 2 PUFFS BY MOUTH EVERY 6 HOURS AS NEEDED FOR WHEEZE OR SHORTNESS OF BREATH 25.5 each 1   azithromycin (ZITHROMAX) 250 MG tablet Take 2 tabs day 1, then 1 tab daily 6 each 0   budesonide-formoterol (SYMBICORT) 160-4.5 MCG/ACT inhaler Inhale 2 puffs into the lungs in the morning and at bedtime. 1 each 12   cholecalciferol (VITAMIN D3) 25 MCG (1000 UNIT) tablet Take 50,000 Units by mouth.     EPINEPHRINE 0.3 mg/0.3 mL IJ SOAJ injection INJECT 0.3 MLS (0.3 MG TOTAL) INTO THE MUSCLE AS NEEDED FOR ANAPHYLAXIS. 2 each 1   fluticasone (FLONASE) 50 MCG/ACT nasal spray PLACE 2 SPRAYS INTO BOTH NOSTRILS DAILY AS NEEDED FOR ALLERGIES OR  RHINITIS. 48 mL 1   folic acid (FOLVITE) 1 MG tablet Take 1 mg by mouth daily.     HUMIRA PEN 40 MG/0.4ML PNKT every 14 (fourteen) days.      levothyroxine (SYNTHROID, LEVOTHROID) 112 MCG tablet Take 112 mcg by mouth daily before breakfast.     Magnesium 300 MG CAPS Take 500 mg by mouth daily.     Multiple Vitamin (MULTIVITAMIN WITH MINERALS) TABS tablet Take 1 tablet by mouth daily.     ofloxacin (FLOXIN) 0.3 % OTIC solution Place 5 drops into the right ear 2 (two) times daily for 7 days. 3.5 mL 0   pantoprazole (PROTONIX) 40 MG tablet Take 40 mg by mouth daily.     predniSONE (STERAPRED UNI-PAK 21 TAB) 5 MG (21) TBPK tablet TAKE 6 TABLETS ON DAY 1 AS DIRECTED ON PACKAGE AND DECREASE BY 1 TAB EACH DAY FOR A TOTAL OF 6 DAYS (Patient not taking: Reported on 08/23/2021)     Riboflavin 100 MG CAPS Take by mouth.     rosuvastatin (CRESTOR) 10 MG tablet TAKE 1 TABLET BY MOUTH EVERY DAY 90 tablet 3   triamcinolone cream (KENALOG) 0.1 % Apply 1 application topically daily as needed. (Patient not taking: Reported on 08/23/2021) 453 g 6   valACYclovir (VALTREX) 500 MG tablet Take 1 tablet by mouth as needed.     vitamin C (ASCORBIC ACID) 500 MG tablet Take 500 mg by mouth daily.     No current facility-administered medications on file prior to visit.    Cardiovascular studies:  Ambulatory cardiac telemetry 01/17/2021- 01/23/2021: Predominant underlying rhythm was sinus with minimum heart rate 57 bpm, maximum heart rate under 63 bpm, average heart rate 86 bpm.  Rare PVCs and PACs.  Symptoms correlated with normal sinus rhythm.  No significant cardiac arrhythmia.  Exercise Sestamibi stress test 01/09/2021: Exercise nuclear stress test was performed using Bruce protocol. Patient reached 7 METS, and 106% of age predicted maximum heart rate. Exercise capacity was low. No chest pain reported. Heart rate and hemodynamic response were normal. Stress EKG revealed no ischemic changes. Normal myocardial perfusion.  Stress LVEF 54%. Low risk study.  EKG 12/21/2020:  Sinus rhythm at a rate of 89 bpm.  Normal axis.   Nonspecific T wave abnormality, unchanged compared to previous.  EKG 05/04/2020: Sinus rhythm 94 bpm Nonspecific T-wave abnormality  Echocardiogram 04/28/2019: Normal LV size and thickness. EF 55-60%. Impaired relaxation. Normal RV size and function. No significant valvular abnormality.   CTA chest 05/23/2018: No demonstrable pulmonary embolus. No thoracic aortic aneurysm or dissection. There is aortic atherosclerosis.  Right subclavian artery arises distally to the other great vessels, passing posteriorly to the esophagus and impressing upon the esophagus as it passes to the right.  No lung edema or consolidation.  No evident thoracic adenopathy. Aortic Atherosclerosis (ICD10-I70.0).  CXR 04/17/2019: Mild cardiomegaly without acute abnormality of the lungs.  CTA coronary angiogram 12/2017: pLAD minimal atherosclerosis. Rest normal. Calcium score 6.   Recent labs: 07/11/2020: Hemoglobin 12.4, hematocrit 38.1, MCV 85.4, platelet 303 Glucose 111, BUN 13, creatinine 0.87, sodium 139, potassium 4.6,  05/02/2020: Chol 168, TG 82, HDL 63, LDL 90  01/18/2020: Chol 245, TG 126, HDL 75, LDL 148  Lipid panel 05/28/2019: Cholesterol 175, triglycerides 70, HDL 66, LDL 96.  Lipid panel 2017: Chol 184, TG 84, HDL 1813, LDL 84.  Lipid panel 2015: Chol 260, TG 73, HDL 59, LDL 193   Review of Systems  Constitutional: Negative for malaise/fatigue and weight gain.  Cardiovascular:  Positive for palpitations. Negative for chest pain, claudication, dyspnea on exertion, leg swelling, near-syncope, orthopnea, paroxysmal nocturnal dyspnea and syncope.  Respiratory:  Positive for shortness of breath.   Hematologic/Lymphatic: Does not bruise/bleed easily.  Gastrointestinal:  Negative for melena.  Neurological:  Negative for light-headedness and weakness.      There were no vitals filed  for this visit.    There is no height or weight on file to calculate BMI. There were no vitals filed for this visit.    Objective:   Physical Exam Vitals and nursing note reviewed.  Constitutional:      General: She is not in acute distress. HENT:     Head: Normocephalic and atraumatic.  Neck:     Vascular: No JVD.  Cardiovascular:     Rate and Rhythm: Normal rate and regular rhythm.     Pulses: Intact distal pulses.          Carotid pulses are 2+ on the right side and 2+ on the left side.      Radial pulses are 2+ on the right side and 2+ on the left side.       Dorsalis pedis pulses are 2+ on the right side and 2+ on the left side.       Posterior tibial pulses are 2+ on the right side and 2+ on the left side.     Heart sounds: Normal heart sounds, S1 normal and S2 normal. No murmur heard.   No gallop.  Pulmonary:  Effort: Pulmonary effort is normal. No respiratory distress.     Breath sounds: Normal breath sounds. No wheezing, rhonchi or rales.  Musculoskeletal:     Right lower leg: No edema.     Left lower leg: No edema.  Skin:    General: Skin is warm and dry.  Neurological:     Mental Status: She is alert.       Assessment & Recommendations:   61 y.o.Caucasian female with hyperlipidemia, aortic and coronary atherrosclerosis, presyncope, Graves' disease, status post thyroidectomy, ulcerative colitis, h/o acute pancreatitis, migraine.   Shortness of breath:  Reviewed and discussed with patient regarding results of ambulatory cardiac telemetry as well as nuclear stress test, details above.  Stress test was low risk and monitor revealed no significant cardiac arrhythmias. Suspect patient's shortness of breath to be multifactorial including asthma as well as deconditioning given patient's low exercise capacity on stress test.  Palpitations:  Patient's amatory cardiac telemetry revealed rare PACs and PVCs, however her symptoms correlated with normal sinus rhythm.   Reassured patient there is no significant cardiac arrhythmia as underlying etiology of her palpitations. Notably patient does have Graves' disease, status post thyroidectomy, for which she follows with Dr. Soyla Murphy.  Would recommend obtaining TSH to evaluate for thyroid dysfunction as underlying of patient's palpitations.  Patient prefers to undergo lab work-up with Dr. Michiel Sites, therefore I will not order thyroid panel at this time.  Hyperlipidemia: Lipids well controlled.  Continue Crestor. Patient has annual visit scheduled with PCP in December, will defer repeat lipid profile testing to PCP.  Aorta and coronary atherosclerosis: Minimal. Continue risk factor modification, especially lipid control. Not on Aspirin due to h/o GI bleeding with ulcerative colitis.   Elevated blood pressure without diagnosis of hypertension:  Blood pressure is well controlled at today's office visit.  Encourage patient to continue to monitor at home.  Follow-up in 1 year, sooner if needed, for palpitations, hyperlipidemia, and blood pressure.   Alethia Berthold, PA-C 08/23/2021, 10:16 AM Office: [phone]  Addendum 02/17/2021: Patient called to correct an error in the above records.  Patient previously been listed at 61 years old, however she is 61 years old.  Direction was made in the HPI in assessment and plan.  Patient also notes she is not Caucasian as noted in HPI and assessment, but is rather Hispanic.  Records going forward will be corrected accordingly.  Patient also noted instances in the above record in which incorrect pronouns were used, this is likely due to error in dictation software, however noted be noted that patient is female and identifies as female.   Alethia Berthold, PA-C 08/23/2021, 10:16 AM Office: [phone]

## 2021-08-23 NOTE — Telephone Encounter (Signed)
Lm for patient.  

## 2021-08-23 NOTE — Progress Notes (Signed)
° °  Danielle Bond is a 61 y.o. female who presents today for an office visit.  Assessment/Plan:  Otitis media We will start azithromycin.  She has done well with this in the past.  This could be contributing to her overall inflammatory state.  She will let me know if not improving.  May need to follow back up with ENT.  Bilateral leg pain Likely secondary to her autoimmune disordersthough she did have a positive D-dimer in the emergency room.  We will need to check Dopplers to rule out DVT. She has a positive Homan's sign today.   Back pain/tachycardia She also has some dyspnea on exertion.  Given her elevated D-dimer and constellation with her other symptoms we will check CTA to rule out PE.  This could be related to her current otitis media and overall inflammatory state though we need to rule out PE first.     Subjective:  HPI:  Patient here for ED follow-up.  Went to ED 2 days ago with inflammation and pain multiple joints.  This has been going on for couple of months.  She saw her rheumatology who started her on a month long course of prednisone. Rheumatologist had negative work-up.  Prednisone did not help.  She called her GI doctor.  GI recommended inflammatory work-up.  She went to the ED and had labs done.  Was found to have elevated sed rate and D-dimer.  She was also found to have an ear infection.  She was given course of eardrops.  She has not had significant improvement with this.  She has had continued pain and swelling in bilateral lower extremities.  This limits her ability to walk.  She is also noted some palpitations and increased heart rate.  She feels shortness of breath with exertion.  No chest pain.  She has had some back pain.  This feels more muscular.       Objective:  Physical Exam: BP 138/84 (BP Location: Left Arm)    Pulse 95    Temp 97.7 F (36.5 C) (Temporal)    Ht 5' (1.524 m)    Wt 146 lb 12.8 oz (66.6 kg)    LMP 03/03/2013    SpO2 97%    BMI 28.67 kg/m    Gen: No acute distress, resting comfortably HEENT: Right TM erythematous and bulging. CV: Tachycardic.No murmurs. Pulm: Normal work of breathing, clear to auscultation bilaterally with no crackles, wheezes, or rhonchi MSK: Bilateral legs with tenderness to palpation along posterior calf.  Neurovascular intact distally Neuro: Grossly normal, moves all extremities Psych: Normal affect and thought content  Time Spent: 45 minutes of total time was spent on the date of the encounter performing the following actions: chart review prior to seeing the patient including her recent ED visit and labs, obtaining history, performing a medically necessary exam, counseling on the treatment plan, placing orders, and documenting in our EHR.        [name]. [name], MD 08/23/2021 9:47 AM

## 2021-08-23 NOTE — Patient Instructions (Signed)
It was very nice to see you today!  Please start the azithromycin.  We need to check to make sure that you do not have a blood clot.  We will check a CT scan of your lungs and an ultrasound of your legs  Take care, Dr [name]  PLEASE NOTE:  If you had any lab tests please let us know if you have not heard back within a few days. You may see your results on mychart before we have a chance to review them but we will give you a call once they are reviewed by [name]. If we ordered any referrals today, please let us know if you have not heard from their office within the next week.   Please try these tips to maintain a healthy lifestyle:  Eat at least 3 REAL meals and 1-2 snacks per day.  Aim for no more than 5 hours between eating.  If you eat breakfast, please do so within one hour of getting up.   Each meal should contain half fruits/vegetables, one quarter protein, and one quarter carbs (no bigger than a computer mouse)  Cut down on sweet beverages. This includes juice, soda, and sweet tea.   Drink at least 1 glass of water with each meal and aim for at least 8 glasses per day  Exercise at least 150 minutes every week.

## 2021-08-24 NOTE — Telephone Encounter (Signed)
FYI

## 2021-08-24 NOTE — Telephone Encounter (Signed)
Please see result note.  Her scans were negative for clot.  I would like for her to finish her azithromycin. Do not need to do any further testing at this time but would like for her to letus know if her symptoms are not improving with treatment for her ear infection.  Danielle Bond. [name], MD 08/24/2021 10:56 AM

## 2021-08-24 NOTE — Progress Notes (Signed)
Please inform patient of the following:  Goods news!  CT scan and ultrasound were negative for blood clots.  Her symptoms could be due to the inflammation coming from her ear infection.  I would like for her to take the azithromycin as we discussed.  Do not need to do any further testing from our perspective at this point.

## 2021-08-24 NOTE — Telephone Encounter (Signed)
CT chest neg for PE  Venous doppler neg DVT  follow up as planned and As needed   Follow up with PCP as planned and As needed

## 2021-08-24 NOTE — Telephone Encounter (Signed)
See lab results.  

## 2021-08-24 NOTE — Telephone Encounter (Signed)
Called and spoke to pt. Informed her of the results and recs per TP. OV has been scheduled with MR for 09/22/2021. Pt verbalized understanding and denied any further questions or concerns at this time.

## 2021-08-25 DIAGNOSIS — K5190 Ulcerative colitis, unspecified, without complications: Secondary | ICD-10-CM | POA: Diagnosis not present

## 2021-08-25 DIAGNOSIS — M79644 Pain in right finger(s): Secondary | ICD-10-CM | POA: Diagnosis not present

## 2021-08-25 DIAGNOSIS — R768 Other specified abnormal immunological findings in serum: Secondary | ICD-10-CM | POA: Diagnosis not present

## 2021-08-25 DIAGNOSIS — M810 Age-related osteoporosis without current pathological fracture: Secondary | ICD-10-CM | POA: Diagnosis not present

## 2021-08-28 ENCOUNTER — Other Ambulatory Visit: Payer: Self-pay

## 2021-08-28 ENCOUNTER — Telehealth: Payer: Self-pay | Admitting: Family Medicine

## 2021-08-28 ENCOUNTER — Encounter: Payer: Self-pay | Admitting: Student

## 2021-08-28 ENCOUNTER — Ambulatory Visit: Payer: BC Managed Care – PPO | Admitting: Student

## 2021-08-28 VITALS — BP 139/90 | HR 128 | Temp 98.2°F | Resp 16 | Ht 60.0 in | Wt 144.4 lb

## 2021-08-28 DIAGNOSIS — R072 Precordial pain: Secondary | ICD-10-CM | POA: Diagnosis not present

## 2021-08-28 DIAGNOSIS — R002 Palpitations: Secondary | ICD-10-CM

## 2021-08-28 DIAGNOSIS — M7989 Other specified soft tissue disorders: Secondary | ICD-10-CM

## 2021-08-28 MED ORDER — METOPROLOL TARTRATE 25 MG PO TABS: 25.0000 mg | ORAL_TABLET | Freq: Two times a day (BID) | ORAL | 3 refills | Status: DC

## 2021-08-28 NOTE — Progress Notes (Signed)
Patient referred by [name], MD for abnormal echocardiogram.  Subjective:   Danielle Bond, female    DOB: [birth year], 61 y.o.   MRN: [id number]   Chief Complaint  Patient presents with   Leg Swelling   Results   Follow-up    61 y.o. hispanic female with hyperlipidemia, aortic and coronary atherrosclerosis, presyncope, Graves' disease, status post thyroidectomy, ulcerative colitis, h/o acute pancreatitis, migraine.   Patient presents for urgent visit at her request with concerns of elevated heart rate intermittently over the last several weeks.  Patient states that starting in November 2022 she has been being treated for otitis media as well as following closely with rheumatology for joint pain and positive inflammatory markers.  She was seen in the emergency department 08/21/2021 at which time D-dimer was elevated.  She subsequently followed up with PCP who ordered CTA and DVT study which were both negative for embolism/thrombus.   Patient's primary concern from a cardiac standpoint is daily palpitations.  Denies chest pain, syncope, near syncope.  Patient states she previously had leg swelling, however this is resolved.  BMP 08/22/2021 was normal.  Current Outpatient Medications on File Prior to Visit  Medication Sig Dispense Refill   albuterol (PROVENTIL) (2.5 MG/3ML) 0.083% nebulizer solution Take 3 mLs (2.5 mg total) by nebulization every 6 (six) hours as needed for wheezing or shortness of breath. 150 mL 1   albuterol (VENTOLIN HFA) 108 (90 Base) MCG/ACT inhaler TAKE 2 PUFFS BY MOUTH EVERY 6 HOURS AS NEEDED FOR WHEEZE OR SHORTNESS OF BREATH 25.5 each 1   azithromycin (ZITHROMAX) 250 MG tablet Take 2 tabs day 1, then 1 tab daily 6 each 0   budesonide-formoterol (SYMBICORT) 160-4.5 MCG/ACT inhaler Inhale 2 puffs into the lungs in the morning and at bedtime. 1 each 12   cholecalciferol (VITAMIN D3) 25 MCG (1000 UNIT) tablet Take 50,000 Units by mouth.     EPINEPHRINE 0.3  mg/0.3 mL IJ SOAJ injection INJECT 0.3 MLS (0.3 MG TOTAL) INTO THE MUSCLE AS NEEDED FOR ANAPHYLAXIS. 2 each 1   fluticasone (FLONASE) 50 MCG/ACT nasal spray PLACE 2 SPRAYS INTO BOTH NOSTRILS DAILY AS NEEDED FOR ALLERGIES OR RHINITIS. 48 mL 1   folic acid (FOLVITE) 1 MG tablet Take 1 mg by mouth daily.     HUMIRA PEN 40 MG/0.4ML PNKT every 14 (fourteen) days.      levothyroxine (SYNTHROID, LEVOTHROID) 112 MCG tablet Take 112 mcg by mouth daily before breakfast.     Magnesium 300 MG CAPS Take 500 mg by mouth daily.     Multiple Vitamin (MULTIVITAMIN WITH MINERALS) TABS tablet Take 1 tablet by mouth daily.     ofloxacin (FLOXIN) 0.3 % OTIC solution Place 5 drops into the right ear 2 (two) times daily for 7 days. 3.5 mL 0   pantoprazole (PROTONIX) 40 MG tablet Take 40 mg by mouth daily.     predniSONE (STERAPRED UNI-PAK 21 TAB) 5 MG (21) TBPK tablet      Riboflavin 100 MG CAPS Take by mouth.     rosuvastatin (CRESTOR) 10 MG tablet TAKE 1 TABLET BY MOUTH EVERY DAY 90 tablet 3   triamcinolone cream (KENALOG) 0.1 % Apply 1 application topically daily as needed. 453 g 6   valACYclovir (VALTREX) 500 MG tablet Take 1 tablet by mouth as needed.     vitamin C (ASCORBIC ACID) 500 MG tablet Take 500 mg by mouth daily.     No current facility-administered medications on file prior  to visit.    Cardiovascular studies: EKG 08/28/2021: Sinus tachycardia at a rate of 116 bpm.  Normal axis.  Left atrial enlargement.  No evidence of ischemia or underlying injury pattern.  Ambulatory cardiac telemetry 01/17/2021- 01/23/2021: Predominant underlying rhythm was sinus with minimum heart rate 57 bpm, maximum heart rate under 63 bpm, average heart rate 86 bpm.  Rare PVCs and PACs.  Symptoms correlated with normal sinus rhythm.  No significant cardiac arrhythmia.  Exercise Sestamibi stress test 01/09/2021: Exercise nuclear stress test was performed using Bruce protocol. Patient reached 7 METS, and 106% of age predicted  maximum heart rate. Exercise capacity was low. No chest pain reported. Heart rate and hemodynamic response were normal. Stress EKG revealed no ischemic changes. Normal myocardial perfusion. Stress LVEF 54%. Low risk study.  EKG 12/21/2020:  Sinus rhythm at a rate of 89 bpm.  Normal axis.   Nonspecific T wave abnormality, unchanged compared to previous.  EKG 05/04/2020: Sinus rhythm 94 bpm Nonspecific T-wave abnormality  Echocardiogram 04/28/2019: Normal LV size and thickness. EF 55-60%. Impaired relaxation. Normal RV size and function. No significant valvular abnormality.   CTA chest 05/23/2018: No demonstrable pulmonary embolus. No thoracic aortic aneurysm or dissection. There is aortic atherosclerosis.  Right subclavian artery arises distally to the other great vessels, passing posteriorly to the esophagus and impressing upon the esophagus as it passes to the right.  No lung edema or consolidation.  No evident thoracic adenopathy. Aortic Atherosclerosis (ICD10-I70.0).  CXR 04/17/2019: Mild cardiomegaly without acute abnormality of the lungs.  CTA coronary angiogram 12/2017: pLAD minimal atherosclerosis. Rest normal. Calcium score 6.   Recent labs: CMP Latest Ref Rng & Units 08/21/2021 07/19/2021 04/18/2021  Glucose 70 - 99 mg/dL 108(H) 99 -  BUN 6 - 20 mg/dL 17 16 14   Creatinine 0.44 - 1.00 mg/dL 0.78 0.76 -  Sodium 135 - 145 mmol/L 138 139 -  Potassium 3.5 - 5.1 mmol/L 3.8 3.8 -  Chloride 98 - 111 mmol/L 104 104 -  CO2 22 - 32 mmol/L 24 26 -  Calcium 8.9 - 10.3 mg/dL 9.3 9.3 -  Total Protein 6.5 - 8.1 g/dL 8.3(H) 7.6 -  Total Bilirubin 0.3 - 1.2 mg/dL 0.8 0.9 -  Alkaline Phos 38 - 126 U/L 53 57 61  AST 15 - 41 U/L 25 26 31   ALT 0 - 44 U/L 14 17 33   CBC Latest Ref Rng & Units 08/21/2021 07/19/2021 04/18/2021  WBC 4.0 - 10.5 K/uL 5.9 5.5 6.0  Hemoglobin 12.0 - 15.0 g/dL 13.3 12.9 13.8  Hematocrit 36.0 - 46.0 % 40.9 38.8 43  Platelets 150 - 400 K/uL 238 239.0 295    Lipid Panel     Component Value Date/Time   CHOL 209 (H) 07/19/2021 0856   CHOL 168 05/02/2020 1024   TRIG 78.0 07/19/2021 0856   HDL 93.00 07/19/2021 0856   HDL 63 05/02/2020 1024   CHOLHDL 2 07/19/2021 0856   VLDL 15.6 07/19/2021 0856   LDLCALC 100 (H) 07/19/2021 0856   LDLCALC 90 05/02/2020 1024   HEMOGLOBIN A1C Lab Results  Component Value Date   HGBA1C 6.2 04/04/2021   TSH Recent Labs    04/04/21 1104  TSH 2.61    07/11/2020: Hemoglobin 12.4, hematocrit 38.1, MCV 85.4, platelet 303 Glucose 111, BUN 13, creatinine 0.87, sodium 139, potassium 4.6,  05/02/2020: Chol 168, TG 82, HDL 63, LDL 90  01/18/2020: Chol 245, TG 126, HDL 75, LDL 148  Lipid panel 05/28/2019: Cholesterol 175,  triglycerides 70, HDL 66, LDL 96.  Lipid panel 2017: Chol 184, TG 84, HDL 1813, LDL 84.  Lipid panel 2015: Chol 260, TG 73, HDL 59, LDL 193   Review of Systems  Cardiovascular:  Positive for palpitations. Negative for chest pain, claudication, dyspnea on exertion, leg swelling (resolved), near-syncope, orthopnea, paroxysmal nocturnal dyspnea and syncope.  Respiratory:  Positive for shortness of breath.       Vitals:   08/28/21 0916  BP: 139/90  Pulse: (!) 128  Resp: 16  Temp: 98.2 F (36.8 C)  SpO2: 98%     Body mass index is 28.2 kg/m. Filed Weights   08/28/21 0916  Weight: 144 lb 6.4 oz (65.5 kg)     Objective:   Physical Exam Vitals reviewed.  Constitutional:      General: She is not in acute distress. Neck:     Vascular: No JVD.  Cardiovascular:     Rate and Rhythm: Regular rhythm. Tachycardia present.     Pulses: Intact distal pulses.          Carotid pulses are 2+ on the right side and 2+ on the left side.      Radial pulses are 2+ on the right side and 2+ on the left side.       Dorsalis pedis pulses are 2+ on the right side and 2+ on the left side.       Posterior tibial pulses are 2+ on the right side and 2+ on the left side.     Heart sounds:  Normal heart sounds, S1 normal and S2 normal. No murmur heard.   No gallop.  Pulmonary:     Effort: Pulmonary effort is normal. No respiratory distress.     Breath sounds: Normal breath sounds. No wheezing, rhonchi or rales.  Musculoskeletal:     Right lower leg: No edema.     Left lower leg: No edema.  Neurological:     Mental Status: She is alert.       Assessment & Recommendations:   61 y.o.hispanic female with hyperlipidemia, aortic and coronary atherrosclerosis, presyncope, Graves' disease, status post thyroidectomy, ulcerative colitis, h/o acute pancreatitis, migraine.   Patient has previously undergone cardiac evaluation with echocardiogram, ambulatory cardiac telemetry, and coronary CTA.  As well as stress test.  Cardiac evaluation has been relatively unremarkable with essentially normal echocardiogram and low risk stress test.  Cardiac monitor revealed PACs and PVCs.  Suspect patient's heart rate is elevated over the last several weeks due to underlying infection.   Shared decision was to start Lopressor 25 mg p.o. twice daily in order to improve heart rate control.  Patient will continue to follow with PCP for management of underlying conditions.  I have also recommended repeat TSH evaluation, which patient prefers to follow with Dr. Chalmers Cater for.  Shortness of breath:  Suspect patient's shortness of breath to be multifactorial including asthma as well as deconditioning given patient's low exercise capacity on stress test.  Palpitations:  Patient's amatory cardiac telemetry revealed rare PACs and PVCs, however her symptoms correlated with normal sinus rhythm.   Again recommend reevaluation of thyroid function, which patient prefers to have done with Dr. Soyla Murphy.  Suspect palpitations are related to underlying sinus tachycardia which is likely secondary to current infection.  Could consider repeat cardiac monitor if symptoms continue. We will start Lopressor 25 mg p.o. twice daily.     Hyperlipidemia: Currently managed by PCP. Continue Crestor  Aorta and coronary atherosclerosis: Not on Aspirin  due to h/o GI bleeding with ulcerative colitis.  Continue lipid management, currently followed by PCP.  Elevated blood pressure without diagnosis of hypertension:  Presently well controlled.   I previously reviewed external records from the ED as well as PCPs office including results of chest CTA and DVT study, laboratory results, and EKG.  Follow-up in 4 weeks, sooner if needed.    Alethia Berthold, PA-C 08/28/2021, 11:36 AM Office: [phone]

## 2021-08-28 NOTE — Telephone Encounter (Signed)
Pt states she wanted [facility] opinion on ENT and she needs a medication for inflammation that does not contain aspirin which she is allergic to.

## 2021-08-30 DIAGNOSIS — H9311 Tinnitus, right ear: Secondary | ICD-10-CM | POA: Diagnosis not present

## 2021-08-30 DIAGNOSIS — J3489 Other specified disorders of nose and nasal sinuses: Secondary | ICD-10-CM | POA: Diagnosis not present

## 2021-08-30 DIAGNOSIS — M2669 Other specified disorders of temporomandibular joint: Secondary | ICD-10-CM | POA: Diagnosis not present

## 2021-08-30 DIAGNOSIS — H6981 Other specified disorders of Eustachian tube, right ear: Secondary | ICD-10-CM | POA: Diagnosis not present

## 2021-08-31 DIAGNOSIS — M19041 Primary osteoarthritis, right hand: Secondary | ICD-10-CM | POA: Diagnosis not present

## 2021-08-31 DIAGNOSIS — M79641 Pain in right hand: Secondary | ICD-10-CM | POA: Diagnosis not present

## 2021-08-31 DIAGNOSIS — M19042 Primary osteoarthritis, left hand: Secondary | ICD-10-CM | POA: Diagnosis not present

## 2021-08-31 DIAGNOSIS — M79642 Pain in left hand: Secondary | ICD-10-CM | POA: Diagnosis not present

## 2021-08-31 NOTE — Telephone Encounter (Signed)
Only option would be a steroid such as prednisone.  [name]. [name], MD 08/31/2021 7:56 AM

## 2021-08-31 NOTE — Telephone Encounter (Signed)
Patient present on ortho appointment for the swelling  Will let us know if she need anything at this time.

## 2021-09-04 DIAGNOSIS — M2550 Pain in unspecified joint: Secondary | ICD-10-CM | POA: Diagnosis not present

## 2021-09-05 DIAGNOSIS — M19041 Primary osteoarthritis, right hand: Secondary | ICD-10-CM | POA: Diagnosis not present

## 2021-09-06 ENCOUNTER — Telehealth: Payer: Self-pay

## 2021-09-06 ENCOUNTER — Other Ambulatory Visit: Payer: Self-pay | Admitting: Student

## 2021-09-06 MED ORDER — METOPROLOL TARTRATE 25 MG PO TABS: 12.5000 mg | ORAL_TABLET | Freq: Two times a day (BID) | ORAL | 3 refills | Status: DC

## 2021-09-06 NOTE — Telephone Encounter (Signed)
I called the patient and left a voicemail.  If her heart rate control is improved following treatment for ear infection she does not necessarily need to be on the metoprolol.  Please see if patient has home monitoring heart rate for me to review.  I have no problem with her doing the procedure this Friday.

## 2021-09-06 NOTE — Progress Notes (Signed)
Patient's home heart rate has been between 75 and 85 bpm.  Advised patient to reduce metoprolol to 12.5 mg p.o. twice daily.  She will notify our office if diarrhea continues.

## 2021-09-06 NOTE — Telephone Encounter (Signed)
Pt called and stated that the lopressor is causing her to have diarrhea and blurred vision. She also gets dizzy. She has had diarrhea since starting this medication. Pt also went to the orthopedic doctor and they are going to try to rule out any nerve issues. She said they were going to use needles to stick into the nerves. They stated that it may cause her HR to go up. She is having it done Friday at 11:00 at [facility] and she wants to make sure that this will not be a problem. Please advise.

## 2021-09-07 NOTE — Telephone Encounter (Signed)
Called and spoke to pt, pt aware. She stated that she had already spoken to [facility] regarding Metoprolol reaction.

## 2021-09-08 DIAGNOSIS — G5601 Carpal tunnel syndrome, right upper limb: Secondary | ICD-10-CM | POA: Diagnosis not present

## 2021-09-08 NOTE — Telephone Encounter (Signed)
Pt scheduled for a f/u with MR 3/3.

## 2021-09-13 ENCOUNTER — Encounter: Payer: Self-pay | Admitting: Family Medicine

## 2021-09-13 ENCOUNTER — Other Ambulatory Visit: Payer: Self-pay

## 2021-09-13 ENCOUNTER — Ambulatory Visit (INDEPENDENT_AMBULATORY_CARE_PROVIDER_SITE_OTHER): Payer: BC Managed Care – PPO | Admitting: Family Medicine

## 2021-09-13 VITALS — BP 119/84 | HR 126 | Temp 97.8°F | Ht 60.0 in | Wt 138.4 lb

## 2021-09-13 DIAGNOSIS — E039 Hypothyroidism, unspecified: Secondary | ICD-10-CM | POA: Diagnosis not present

## 2021-09-13 DIAGNOSIS — G5600 Carpal tunnel syndrome, unspecified upper limb: Secondary | ICD-10-CM

## 2021-09-13 DIAGNOSIS — K51011 Ulcerative (chronic) pancolitis with rectal bleeding: Secondary | ICD-10-CM | POA: Diagnosis not present

## 2021-09-13 DIAGNOSIS — M130 Polyarthritis, unspecified: Secondary | ICD-10-CM | POA: Diagnosis not present

## 2021-09-13 DIAGNOSIS — G629 Polyneuropathy, unspecified: Secondary | ICD-10-CM | POA: Diagnosis not present

## 2021-09-13 LAB — VITAMIN B12: Vitamin B-12: 1339 pg/mL — ABNORMAL HIGH (ref 211–911)

## 2021-09-13 LAB — T3, FREE: T3, Free: 2.8 pg/mL (ref 2.3–4.2)

## 2021-09-13 LAB — T4, FREE: Free T4: 1.14 ng/dL (ref 0.60–1.60)

## 2021-09-13 LAB — TSH: TSH: 4.84 u[IU]/mL (ref 0.35–5.50)

## 2021-09-13 LAB — VITAMIN D 25 HYDROXY (VIT D DEFICIENCY, FRACTURES): VITD: 48.99 ng/mL (ref 30.00–100.00)

## 2021-09-13 NOTE — Patient Instructions (Signed)
It was very nice to see you today!  I am concerned that she may be having neuropathic pain.  This could be coming from a pinched nerve.  We will check another lab test today to rule out any other possible causes.  I will place a referral for you to see Dr. Georgina Snell with sports medicine.  I will also refer you to see the gastroenterologist with [place].  We will check blood work today.  Take care, Dr [name]  PLEASE NOTE:  If you had any lab tests please let us know if you have not heard back within a few days. You may see your results on mychart before we have a chance to review them but we will give you a call once they are reviewed by [name]. If we ordered any referrals today, please let us know if you have not heard from their office within the next week.   Please try these tips to maintain a healthy lifestyle:  Eat at least 3 REAL meals and 1-2 snacks per day.  Aim for no more than 5 hours between eating.  If you eat breakfast, please do so within one hour of getting up.   Each meal should contain half fruits/vegetables, one quarter protein, and one quarter carbs (no bigger than a computer mouse)  Cut down on sweet beverages. This includes juice, soda, and sweet tea.   Drink at least 1 glass of water with each meal and aim for at least 8 glasses per day  Exercise at least 150 minutes every week.

## 2021-09-13 NOTE — Progress Notes (Signed)
Danielle Bond is a 61 y.o. female who presents today for an office visit.  Assessment/Plan:  New/Acute Problems: Neuropathic Pain Unclear etiology.  She did have an upper extremity nerve conduction study performed however these results are not yet back.  She does have a lot of neuropathic symptoms in her lower extremities.  She has had extensive work-up the last couple of months including B12 and other labs which were essentially negative.  We will check an ANCA today -this was mildly positive about 4 years ago.  Given her positive straight leg raise it is possible this could be more mechanical/compressive in nature.  She may benefit from a MRI or advanced imaging to further delineate.  We will place referral to sports medicine for further evaluation.  Carpal Tunnel Syndrome Her nerve conduction studies are not yet back however she does have bilateral positive Tinel sign.  Place referral to sports medicine for further management as above.  Polyarthritis Likely has a small amount of osteoarthritis though this also could be related to her underlying inflammatory condition.  We will place referral to sports medicine as above.  Chronic Problems Addressed Today: Ulcerative colitis Overall symptoms have been stable.  Will place referral to establish with GI within [place].  Hypothyroidism s/p total thyroidectomy 1997 We will take over management for her thyroid prescription.  Continue Synthroid 112 mcg daily.  Check labs today.     Subjective:  HPI:  Patient here for follow-up.  She was last seen about 3 weeks ago.  At that time she was having issues with polyarthralgia and concerns for increased inflammatory levels.  She did have a positive D-dimer in the emergency room.  We checked Dopplers and PE which were negative for VTE.  Her pain has persisted since then.  She has been seen at [facility] and underwent nerve conduction study recently.  This was not a pleasant experience.  She does  not have the results back yet but she was told that she potentially may be having carpal tunnel syndrome.  Her most pressing and bothersome symptoms at this point include bilateral leg pain.  This is described as a burning sensation.  It is there constantly.  No obvious aggravating or alleviating factors.  She does have an underlying history of a nonspecified autoimmune disorder.  She has seen rheumatology in the past for this.  She had been on prednisone for extended course a few months ago however she has not had any courses recently.  She cannot take NSAIDs due to history of allergy to aspirin.  Pain in her legs describes a tingling sensation.  Located from her hips all the way down into her toes.  She also have similar symptoms in her arms.  Has generalized weakness.  Some malaise.  See A/P for status of chronic conditions.       Objective:  Physical Exam: BP 119/84 (BP Location: Left Arm)    Pulse (!) 126    Temp 97.8 F (36.6 C) (Temporal)    Ht 5' (1.524 m)    Wt 138 lb 6.4 oz (62.8 kg)    LMP 03/03/2013    SpO2 97%    BMI 27.03 kg/m   Gen: No acute distress, resting comfortably CV: Regular rate and rhythm with no murmurs appreciated Pulm: Normal work of breathing, clear to auscultation bilaterally with no crackles, wheezes, or rhonchi MSK: - Neck: No deformities nontender to palpation - Arms: No deformities.  Positive Tinel's sign at bilateral wrist - Back:  No deformities.  Nontender to palpation.  Straight leg raise positive bilaterally - Legs: No deformities.  Strength 5 out of 5 throughout.  Sensation to light touch grossly intact throughout. Neuro: Grossly normal, moves all extremities Psych: Normal affect and thought content  Time Spent: 50 minutes of total time was spent on the date of the encounter performing the following actions: chart review prior to seeing the patient including recent visit with specialist, obtaining history, performing a medically necessary exam,  counseling on the treatment plan, placing orders, and documenting in our EHR.        [name]. [name], MD 09/13/2021 11:36 AM

## 2021-09-13 NOTE — Assessment & Plan Note (Signed)
Overall symptoms have been stable.  Will place referral to establish with GI within [place].

## 2021-09-13 NOTE — Assessment & Plan Note (Signed)
We will take over management for her thyroid prescription.  Continue Synthroid 112 mcg daily.  Check labs today.

## 2021-09-15 LAB — ANCA TITERS
Atypical pANCA: <1:20 {titer}
C-ANCA: <1:20 {titer}
P-ANCA: <1:20 {titer}

## 2021-09-15 NOTE — Progress Notes (Signed)
Subjective:    CC: Polyarthralgia, Carpal Tunnel Syndrome, B leg pain  I, Danielle Bond, LAT, ATC, am serving as scribe for Dr. Lynne Leader.  HPI: Pt is a 61 y/o female presenting w/ multiple c/o including B leg pain, possible CTS and polyarthralgia.  Pt has a hx of autoimmune disorder and has seen rheumatology in the past.  She has most recently been seen by [facility] and had a NCV/EMG.  B leg pain:  -Low back pain: -Radiating pain: yes in B legs to her toes -LE numbness/tingling: yes -Aggravating factors: -Treatments tried: prednisone in the past  Carpal Tunnel/Wrist/Hand:  -Neck pain: -Radiating pain: -Numbness/tingling: -Aggravating factors: -Treatments tried:  Polyarthralgia: Previously under the care of Dr. Kathlene November at [facility]. Patient is thought to have seronegative rheumatoid arthritis as part of her inflammatory arthritis condition.  Currently managed with Humira.  Oral steroids did not help very much.  Diagnostic testing: NCV/EMG at [facility]- 09/08/21; B LE venous doppler US- 08/23/21; L-spine XR- 06/02/20  Pertinent review of Systems: No fevers or chills  Relevant historical information: Inflammatory arthritis thought to be seronegative rheumatoid arthritis. Inflammatory bowel disease. Autoimmune hepatitis  Allergic to aspirin and NSAIDs.   Objective:    Vitals:   09/18/21 1034  BP: (!) 150/100  Pulse: (!) 120  SpO2: 96%   General: Well Developed, well nourished, in pain appearing and frustrated appearing  MSK:  Hands bilaterally have mild synovitis across MCPs and PIPs Decreased range of motion bilateral hands. Decreased range of motion bilateral knees and ankles.  Lab and Radiology Results Recent Results (from the past 2160 hour(s))  HM DEXA SCAN     Status: None   Collection Time: 06/21/21 12:00 AM  Result Value Ref Range   HM Dexa Scan osteoporosis     Comment: See chart  CBC     Status: None   Collection Time:  07/19/21  8:56 AM  Result Value Ref Range   WBC 5.5 4.0 - 10.5 K/uL   RBC 4.36 3.87 - 5.11 Mil/uL   Platelets 239.0 150.0 - 400.0 K/uL   Hemoglobin 12.9 12.0 - 15.0 g/dL   HCT 38.8 36.0 - 46.0 %   MCV 88.9 78.0 - 100.0 fl   MCHC 33.3 30.0 - 36.0 g/dL   RDW 13.1 11.5 - 15.5 %  Comprehensive metabolic panel     Status: None   Collection Time: 07/19/21  8:56 AM  Result Value Ref Range   Sodium 139 135 - 145 mEq/L   Potassium 3.8 3.5 - 5.1 mEq/L   Chloride 104 96 - 112 mEq/L   CO2 26 19 - 32 mEq/L   Glucose, Bld 99 70 - 99 mg/dL   BUN 16 6 - 23 mg/dL   Creatinine, Ser 0.76 0.40 - 1.20 mg/dL   Total Bilirubin 0.9 0.2 - 1.2 mg/dL   Alkaline Phosphatase 57 39 - 117 U/L   AST 26 0 - 37 U/L   ALT 17 0 - 35 U/L   Total Protein 7.6 6.0 - 8.3 g/dL   Albumin 4.0 3.5 - 5.2 g/dL   GFR 85.00 >60.00 mL/min    Comment: Calculated using the CKD-EPI Creatinine Equation (2021)   Calcium 9.3 8.4 - 10.5 mg/dL  Lipid panel     Status: Abnormal   Collection Time: 07/19/21  8:56 AM  Result Value Ref Range   Cholesterol 209 (H) 0 - 200 mg/dL    Comment: ATP III Classification  Desirable:  < 200 mg/dL               Borderline High:  200 - 239 mg/dL          High:  > = 240 mg/dL   Triglycerides 78.0 0.0 - 149.0 mg/dL    Comment: Normal:  <150 mg/dLBorderline High:  150 - 199 mg/dL   HDL 93.00 >39.00 mg/dL   VLDL 15.6 0.0 - 40.0 mg/dL   LDL Cholesterol 100 (H) 0 - 99 mg/dL   Total CHOL/HDL Ratio 2     Comment:                Men          Women1/2 Average Risk     3.4          3.3Average Risk          5.0          4.42X Average Risk          9.6          7.13X Average Risk          15.0          11.0                       NonHDL 115.90     Comment: NOTE:  Non-HDL goal should be 30 mg/dL higher than patient's LDL goal (i.e. LDL goal of < 70 mg/dL, would have non-HDL goal of < 100 mg/dL)  Magnesium     Status: None   Collection Time: 07/19/21  8:56 AM  Result Value Ref Range   Magnesium 2.1 1.5 -  2.5 mg/dL  Iron, TIBC and Ferritin Panel     Status: None   Collection Time: 07/19/21  8:56 AM  Result Value Ref Range   Iron 121 45 - 160 mcg/dL   TIBC 359 250 - 450 mcg/dL (calc)   %SAT 34 16 - 45 % (calc)   Ferritin 56 16 - 232 ng/mL  Vitamin B12     Status: None   Collection Time: 07/19/21  8:56 AM  Result Value Ref Range   Vitamin B-12 779 211 - 911 pg/mL  CBC with Differential     Status: None   Collection Time: 08/21/21  6:02 PM  Result Value Ref Range   WBC 5.9 4.0 - 10.5 K/uL   RBC 4.63 3.87 - 5.11 MIL/uL   Hemoglobin 13.3 12.0 - 15.0 g/dL   HCT 40.9 36.0 - 46.0 %   MCV 88.3 80.0 - 100.0 fL   MCH 28.7 26.0 - 34.0 pg   MCHC 32.5 30.0 - 36.0 g/dL   RDW 12.9 11.5 - 15.5 %   Platelets 238 150 - 400 K/uL   nRBC 0.0 0.0 - 0.2 %   Neutrophils Relative % 61 %   Neutro Abs 3.6 1.7 - 7.7 K/uL   Lymphocytes Relative 28 %   Lymphs Abs 1.6 0.7 - 4.0 K/uL   Monocytes Relative 9 %   Monocytes Absolute 0.5 0.1 - 1.0 K/uL   Eosinophils Relative 1 %   Eosinophils Absolute 0.1 0.0 - 0.5 K/uL   Basophils Relative 1 %   Basophils Absolute 0.0 0.0 - 0.1 K/uL   Immature Granulocytes 0 %   Abs Immature Granulocytes 0.01 0.00 - 0.07 K/uL    Comment: Performed at [facility], [address] [place], [place] [address]  Comprehensive metabolic panel  Status: Abnormal   Collection Time: 08/21/21  6:02 PM  Result Value Ref Range   Sodium 138 135 - 145 mmol/L   Potassium 3.8 3.5 - 5.1 mmol/L   Chloride 104 98 - 111 mmol/L   CO2 24 22 - 32 mmol/L   Glucose, Bld 108 (H) 70 - 99 mg/dL    Comment: Glucose reference range applies only to samples taken after fasting for at least 8 hours.   BUN 17 6 - 20 mg/dL   Creatinine, Ser 0.78 0.44 - 1.00 mg/dL   Calcium 9.3 8.9 - 10.3 mg/dL   Total Protein 8.3 (H) 6.5 - 8.1 g/dL   Albumin 4.2 3.5 - 5.0 g/dL   AST 25 15 - 41 U/L   ALT 14 0 - 44 U/L   Alkaline Phosphatase 53 38 - 126 U/L   Total Bilirubin 0.8 0.3 - 1.2 mg/dL    GFR, Estimated >60 >60 mL/min    Comment: (NOTE) Calculated using the CKD-EPI Creatinine Equation (2021)    Anion gap 10 5 - 15    Comment: Performed at [facility], [address], [place], [place] [address]  Sedimentation rate     Status: Abnormal   Collection Time: 08/21/21  6:02 PM  Result Value Ref Range   Sed Rate 53 (H) 0 - 22 mm/hr    Comment: Performed at [facility], [place], [place] [address]  C-reactive protein     Status: None   Collection Time: 08/21/21  6:02 PM  Result Value Ref Range   CRP 0.8 <1.0 mg/dL    Comment: Performed at [facility], [address], [place], [place] [address]  D-dimer, quantitative     Status: Abnormal   Collection Time: 08/21/21  6:02 PM  Result Value Ref Range   D-Dimer, Quant 1.75 (H) 0.00 - 0.50 ug/mL-FEU    Comment: (NOTE) At the manufacturer cut-off value of 0.5 g/mL FEU, this assay has a negative predictive value of 95-100%.This assay is intended for use in conjunction with a clinical pretest probability (PTP) assessment model to exclude pulmonary embolism (PE) and deep venous thrombosis (DVT) in outpatients suspected of PE or DVT. Results should be correlated with clinical presentation. Performed at [facility], [address] [place], [place] [address]   Resp Panel by RT-PCR (Flu A&B, Covid) Nasopharyngeal Swab     Status: None   Collection Time: 08/21/21  8:30 PM   Specimen: Nasopharyngeal Swab; Nasopharyngeal(NP) swabs in vial transport medium  Result Value Ref Range   SARS Coronavirus 2 by RT PCR NEGATIVE NEGATIVE    Comment: (NOTE) SARS-CoV-2 target nucleic acids are NOT DETECTED.  The SARS-CoV-2 RNA is generally detectable in upper respiratory specimens during the acute phase of infection. The lowest concentration of SARS-CoV-2 viral copies this assay can detect is 138 copies/mL. A negative result does not preclude  SARS-Cov-2 infection and should not be used as the sole basis for treatment or other patient management decisions. A negative result may occur with  improper specimen collection/handling, submission of specimen other than nasopharyngeal swab, presence of viral mutation(s) within the areas targeted by this assay, and inadequate number of viral copies(<138 copies/mL). A negative result must be combined with clinical observations, patient history, and epidemiological information. The expected result is Negative.  Fact Sheet for Patients:  EntrepreneurPulse.com.au  Fact Sheet for Healthcare Providers:  IncredibleEmployment.be  This test is no t yet approved or cleared by the [place] FDA  and  has been authorized for detection and/or diagnosis of SARS-CoV-2 by FDA under an Emergency Use Authorization (EUA). This EUA will remain  in effect (meaning this test can be used) for the duration of the COVID-19 declaration under Section 564(b)(1) of the Act, 21 U.S.C.section 360bbb-3(b)(1), unless the authorization is terminated  or revoked sooner.       Influenza A by PCR NEGATIVE NEGATIVE   Influenza B by PCR NEGATIVE NEGATIVE    Comment: (NOTE) The Xpert Xpress SARS-CoV-2/FLU/RSV plus assay is intended as an aid in the diagnosis of influenza from Nasopharyngeal swab specimens and should not be used as a sole basis for treatment. Nasal washings and aspirates are unacceptable for Xpert Xpress SARS-CoV-2/FLU/RSV testing.  Fact Sheet for Patients: EntrepreneurPulse.com.au  Fact Sheet for Healthcare Providers: IncredibleEmployment.be  This test is not yet approved or cleared by the [place] FDA and has been authorized for detection and/or diagnosis of SARS-CoV-2 by FDA under an Emergency Use Authorization (EUA). This EUA will remain in effect (meaning this test can be used) for the duration of the COVID-19  declaration under Section 564(b)(1) of the Act, 21 U.S.C. section 360bbb-3(b)(1), unless the authorization is terminated or revoked.  Performed at [facility], [address], [place], [place] [address]   Brain natriuretic peptide     Status: None   Collection Time: 08/22/21  1:31 PM  Result Value Ref Range   BNP 9.5 0.0 - 100.0 pg/mL    Comment: Siemens ADVIA Centaur XP methodology  ANCA TITERS     Status: None   Collection Time: 09/13/21 11:48 AM  Result Value Ref Range   C-ANCA <1:20 Neg:<1:20 titer   P-ANCA <1:20 Neg:<1:20 titer    Comment: The presence of positive fluorescence exhibiting P-ANCA or C-ANCA patterns alone is not specific for the diagnosis of Wegener's Granulomatosis (WG) or microscopic polyangiitis. Decisions about treatment should not be based solely on ANCA IFA results.  The International ANCA Group Consensus recommends follow up testing of positive sera with both PR-3 and MPO-ANCA enzyme immunoassays. As many as 5% serum samples are positive only by EIA. Ref. AM J Clin Pathol 1999;111:507-513.    Atypical pANCA <1:20 Neg:<1:20 titer    Comment: The atypical pANCA pattern has been observed in a significant percentage of patients with ulcerative colitis, primary sclerosing cholangitis and autoimmune hepatitis.   TSH     Status: None   Collection Time: 09/13/21 11:48 AM  Result Value Ref Range   TSH 4.84 0.35 - 5.50 uIU/mL  VITAMIN D 25 Hydroxy (Vit-D Deficiency, Fractures)     Status: None   Collection Time: 09/13/21 11:48 AM  Result Value Ref Range   VITD 48.99 30.00 - 100.00 ng/mL  Vitamin B12     Status: Abnormal   Collection Time: 09/13/21 11:48 AM  Result Value Ref Range   Vitamin B-12 1,339 (H) 211 - 911 pg/mL  T3, free     Status: None   Collection Time: 09/13/21 11:48 AM  Result Value Ref Range   T3, Free 2.8 2.3 - 4.2 pg/mL  T4, free     Status: None   Collection Time: 09/13/21 11:48 AM  Result Value Ref Range   Free  T4 1.14 0.60 - 1.60 ng/dL    Comment: Specimens from patients who are undergoing biotin therapy and /or ingesting biotin supplements may contain high levels of biotin.  The higher biotin concentration in these specimens interferes with this Free T4 assay.  Specimens that contain  high levels  of biotin may cause false high results for this Free T4 assay.  Please interpret results in light of the total clinical presentation of the patient.      Please see scanned document from rheumatology visit with Dr. Kathlene November on 08/25/21 in Media tab for further labs and A/P from Rheumatology   Nerve conduction study from [facility] dated September 08, 2021 showing mild right carpal tunnel syndrome with median sensory nerve only without evidence of axonal loss.  Normal EMG of the APB muscle.  No evidence of left carpal tunnel syndrome.  No evidence of right or left ulnar neuropathy at the wrist or the elbow.  No evidence of left or right upper limb peripheral neuropathy.  No evidence of left or right upper limb cervical radiculopathy.   Impression and Recommendations:    Assessment and Plan: 61 y.o. female with polyarthralgia. I think at this point the most likely explanation is seronegative rheumatoid arthritis.  This fits with her history of inflammatory bowel disease ulcerative colitis and autoimmune hepatitis.  It sounds like in the past she was somewhat reasonably controlled with Humira however recently she has been not well controlled. I agree with Dr. Teodoro Spray general plan to try switching away from her current anti-TNF medication of Humira to either Remicade or Cimzia and adding a disease modifying antirheumatic such as hydroxychloroquine.  She is reluctant to consider methotrexate, sulfasalazine or leflunomide given her prior history of liver inflammation due to autoimmune hepatitis. However there has been a bit of a disagreement between her rheumatologist and her gastroenterologist (Dr. Therisa Doyne at [facility])  about her need to switch anti-TNF and when.  Her gastroenterologist would like to at least wait until after her colonoscopy on March 14. She has become very frustrated with her current rheumatologist and is ultimately looking to switch to a new rheumatologist.  She was referred to Dr. Benjamine Mola about a month ago but has not waited to schedule her initial appointment with him until this visit.  I think at this point there is not much more work-up that I can do or any other treatment that I can easily offer to directly treat and control her polyarthralgia.  I think fundamentally she should switch to Remicade or Cimzia (she would prefer Cimzia) and she probably should be on a disease modifying antirheumatic such as hydroxychloroquine.  However I am not equipped to offer either 1 of these medications.  As far as her general symptoms go she is in a bit of a bind.  She mentions that oral steroids have not been helpful.  I could offer nortriptyline or Cymbalta or something like it at bedtime but I do not think that she is getting like that very much based on our conversation today.  I would like to see her back in 2 weeks to talk about this further but I am not optimistic that she will be satisfied with these medications.   I think her best bet is to get in rapidly with a new rheumatologist to get started on the above plan.  I can offer isolated steroid injections to individual joints but she has an overwhelming polyarthralgia and individual injections I do not think will be helpful.   Total encounter time 45 plus minutes including face-to-face time with the patient and, reviewing past medical record, and charting on the date of service.      Discussed warning signs or symptoms. Please see discharge instructions. Patient expresses understanding.   The above documentation has  been reviewed and is accurate and complete Lynne Leader, M.D.

## 2021-09-18 ENCOUNTER — Other Ambulatory Visit: Payer: Self-pay | Admitting: Family Medicine

## 2021-09-18 ENCOUNTER — Ambulatory Visit (INDEPENDENT_AMBULATORY_CARE_PROVIDER_SITE_OTHER): Payer: BC Managed Care – PPO | Admitting: Family Medicine

## 2021-09-18 ENCOUNTER — Other Ambulatory Visit: Payer: Self-pay

## 2021-09-18 ENCOUNTER — Encounter: Payer: Self-pay | Admitting: Family Medicine

## 2021-09-18 VITALS — BP 150/100 | HR 120 | Ht 60.0 in | Wt 136.4 lb

## 2021-09-18 DIAGNOSIS — M2550 Pain in unspecified joint: Secondary | ICD-10-CM | POA: Diagnosis not present

## 2021-09-18 DIAGNOSIS — M0600 Rheumatoid arthritis without rheumatoid factor, unspecified site: Secondary | ICD-10-CM | POA: Diagnosis not present

## 2021-09-18 DIAGNOSIS — K51011 Ulcerative (chronic) pancolitis with rectal bleeding: Secondary | ICD-10-CM

## 2021-09-18 DIAGNOSIS — K754 Autoimmune hepatitis: Secondary | ICD-10-CM

## 2021-09-18 NOTE — Progress Notes (Signed)
Please inform patient of the following:  Her labs are all NORMAL. Would like for her to follow up with sports medicine as we discussed.  [name]. [name], MD 09/18/2021 8:26 AM

## 2021-09-18 NOTE — Patient Instructions (Addendum)
Nice to meet you today.  Follow-up: 2 weeks - keep at 30 min.   Let me know if this is not working.   I will send letters.   Call Dr Benjamine Mola Dr Benjamine Mola phone number Phone: [phone]  If you can't get in with Dr Benjamine Mola soon let me know.

## 2021-09-19 ENCOUNTER — Encounter: Payer: Self-pay | Admitting: Family Medicine

## 2021-09-19 ENCOUNTER — Telehealth: Payer: Self-pay | Admitting: Family Medicine

## 2021-09-19 NOTE — Telephone Encounter (Signed)
Patient called asking if we could correct something under her list of allergies and reactions.  For prednisone, it should should be changed from "all steroids causes nausea and pancreatitis" to all steroids cause nausea and ACUTE pancreatitis.  She also said that while she was here yesterday, she scheduled a follow up appointment with Dr Georgina Snell.  Per the patient, she did not see while a follow up appointment was needed. She said that Dr Georgina Snell did not "examine her" during her appointment and she could just see her primary care in the future.  Appointment has been canceled.

## 2021-09-19 NOTE — Telephone Encounter (Signed)
I called Danielle Bond back. Corrected the drug allergy.  She has a follow-up appointment scheduled tomorrow with the PA at [facility].  She has not scheduled yet the initial consultation with Dr. Benjamine Mola rheumatology.  I encouraged that to be done ASAP. I apologized that the visit did not go as well as she would like orders I would like yesterday but explained that I am happy to provide ongoing care if she would like me to and explained some things that could be done in the future such as trying nortriptyline or Cymbalta or even gabapentin or Lyrica.  She explained that she is not very much interested in medications at this time.  Happy to be available in the future if needed.

## 2021-09-20 ENCOUNTER — Ambulatory Visit (INDEPENDENT_AMBULATORY_CARE_PROVIDER_SITE_OTHER): Payer: BC Managed Care – PPO | Admitting: Family

## 2021-09-20 ENCOUNTER — Telehealth: Payer: Self-pay | Admitting: Family Medicine

## 2021-09-20 ENCOUNTER — Encounter: Payer: Self-pay | Admitting: Family

## 2021-09-20 DIAGNOSIS — R197 Diarrhea, unspecified: Secondary | ICD-10-CM | POA: Diagnosis not present

## 2021-09-20 DIAGNOSIS — G8929 Other chronic pain: Secondary | ICD-10-CM | POA: Diagnosis not present

## 2021-09-20 DIAGNOSIS — M5416 Radiculopathy, lumbar region: Secondary | ICD-10-CM | POA: Diagnosis not present

## 2021-09-20 DIAGNOSIS — M25512 Pain in left shoulder: Secondary | ICD-10-CM | POA: Diagnosis not present

## 2021-09-20 DIAGNOSIS — K921 Melena: Secondary | ICD-10-CM | POA: Diagnosis not present

## 2021-09-20 MED ORDER — LIDOCAINE HCL 1 % IJ SOLN
3.0000 mL | INTRAMUSCULAR | Status: AC | PRN
  Administered 2021-09-20: 3 mL

## 2021-09-20 MED ORDER — GABAPENTIN 100 MG PO CAPS: 100.0000 mg | ORAL_CAPSULE | Freq: Three times a day (TID) | ORAL | 0 refills | Status: DC

## 2021-09-20 MED ORDER — METHYLPREDNISOLONE ACETATE 40 MG/ML IJ SUSP
20.0000 mg | INTRAMUSCULAR | Status: AC | PRN
  Administered 2021-09-20: 20 mg via INTRA_ARTICULAR

## 2021-09-20 NOTE — Progress Notes (Signed)
? ?Office Visit Note ?  ?Patient: Danielle Bond           ?Date of Birth: Nov 06, 1960           ?MRN: [id number] ?Visit Date: 09/20/2021 ?             ?Requested by: [name], MD ?[address] ?[place],  [place] [address] ?PCP: [name], MD ? ?Chief Complaint  ?Patient presents with  ? Left Shoulder - Pain  ? Right Knee - Pain  ? ? ? ? ?HPI: ?The patient is a 61 year old woman presenting today complaining of bilateral leg and knee pain.  She is also having issues with her left shoulder. ? ?States has history of autoimmune issues has been followed by rheumatology in the past most recently with [facility].  Thought to have seronegative rheumatoid arthritis she is currently managed with Humira. ? ?States she has been having significant issues with her lower extremities and left shoulder since November of last year she has had an ear infection and issues with her ulcerative colitis.  While these were flaring had flaring of her joint pain which has not yet subsided. ? ?The lower extremity pain is most troublesome to her and radiates from her hips down her anterior thighs the knees are significantly painful she has poorly localized knee pain worse pain with flexion pain and difficulty sleeping at night.  She has pain that radiates into the lower legs and feet as well.  She complains of popping and swelling in her knees there is some associated warmth in her bilateral knees. ? ?No giving way or falls.  Denies any weakness or heaviness of the lower extremities. ? ?Has tried using prednisone without any relief.  States she is unable to take Tylenol due to her hepatitis.  She is unable to take anti-inflammatories as well. ? ?Her left shoulder is aching and troublesome she states this feels like prior episodes of a frozen shoulder.  She is unable to reach above her head has pain reaching behind her back.  She does relate that she has had relief with Depo-Medrol injections in the past.  She  requests a lower dose. ? ?Assessment & Plan: ?Visit Diagnoses: No diagnosis found. ? ?Plan: Discussed possibility of updating her MRI she has had an MRI just last year.  We will go ahead and refer her to Dr. Ernestina Patches for evaluation and epidural steroid injection.  I will also trial her on a course of gabapentin for her neuropathic pain. ? ?Depo-Medrol injection of the left shoulder today.  Patient tolerated well.  She will follow-up with Dr. Sharol Given if this fails to improve. ? ?Follow-Up Instructions: Return in about 4 weeks (around 10/18/2021), or if symptoms worsen or fail to improve, for l shoulder.  ? ?Right Knee Exam  ? ?Muscle Strength  ?The patient has normal right knee strength. ? ?Tenderness  ?Right knee tenderness location: diffuse. ? ?Range of Motion  ?The patient has normal right knee ROM. ?Right knee flexion: painful.  ? ?Other  ?Swelling: none ? ? ?Left Knee Exam  ? ?Tenderness  ?Left knee tenderness location: diffuse. ? ?Range of Motion  ?The patient has normal left knee ROM. ?Left knee flexion: painful.  ? ?Tests  ?Varus: negative Valgus: negative ? ?Other  ?Erythema: absent ?Swelling: none ? ? ?Back Exam  ? ?Tenderness  ?The patient is experiencing tenderness in the lumbar. ? ?Range of Motion  ?The patient has normal back ROM. ? ?Muscle Strength  ?  The patient has normal back strength. ? ?Tests  ?Straight leg raise right: positive ?Straight leg raise left: positive ? ?Other  ?Gait: normal  ? ? ?Left Shoulder Exam  ? ?Tenderness  ?The patient is experiencing tenderness in the biceps tendon. ? ?Range of Motion  ?Active abduction:  abnormal  ? ?Tests  ?Impingement: positive ? ?Other  ?Erythema: absent  ? ? ? ? ?Patient is alert, oriented, no adenopathy, well-dressed, normal affect, normal respiratory effort. ?L shoulder with 90 abduction ? ?Imaging: ?No results found. ?No images are attached to the encounter. ? ?Labs: ?Lab Results  ?Component Value Date  ? HGBA1C 6.2 04/04/2021  ? ESRSEDRATE 53 (H) 08/21/2021   ? ESRSEDRATE 64 (H) 07/09/2019  ? ESRSEDRATE 50 (H) 06/26/2018  ? CRP 0.8 08/21/2021  ? CRP 85.3 (H) 06/26/2018  ? REPTSTATUS 03/12/2014 FINAL 03/10/2014  ? CULT  03/10/2014  ?  Multiple bacterial morphotypes present, none predominant. Suggest appropriate recollection if clinically indicated. ?Performed at [facility]  ? LABORGA NO GROWTH 03/22/2014  ? ? ? ?Lab Results  ?Component Value Date  ? ALBUMIN 4.2 08/21/2021  ? ALBUMIN 4.0 07/19/2021  ? ALBUMIN 4.2 04/18/2021  ? ? ?Lab Results  ?Component Value Date  ? MG 2.1 07/19/2021  ? MG 1.6 08/15/2012  ? ?Lab Results  ?Component Value Date  ? VD25OH 48.99 09/13/2021  ? VD25OH 41.05 04/04/2021  ? ? ?No results found for: PREALBUMIN ?CBC EXTENDED Latest Ref Rng & Units 08/21/2021 07/19/2021 04/18/2021  ?WBC 4.0 - 10.5 K/uL 5.9 5.5 6.0  ?RBC 3.87 - 5.11 MIL/uL 4.63 4.36 4.31  ?HGB 12.0 - 15.0 g/dL 13.3 12.9 13.8  ?HCT 36.0 - 46.0 % 40.9 38.8 43  ?PLT 150 - 400 K/uL 238 239.0 295  ?NEUTROABS 1.7 - 7.7 K/uL 3.6 - 54.50  ?LYMPHSABS 0.7 - 4.0 K/uL 1.6 - -  ? ? ? ?There is no height or weight on file to calculate BMI. ? ?Orders:  ?No orders of the defined types were placed in this encounter. ? ?No orders of the defined types were placed in this encounter. ? ? ? Procedures: ?Large Joint Inj: L subacromial bursa on 09/20/2021 10:33 AM ?Indications: pain ?Details: 22 G 1.5 in needle ?Medications: 3 mL lidocaine 1 %; 20 mg methylPREDNISolone acetate 40 MG/ML ?Consent was given by the patient.  ? ? ? ?Clinical Data: ?No additional findings. ? ?ROS: ? ?All other systems negative, except as noted in the HPI. ?Review of Systems  ?Constitutional:  Positive for fever. Negative for chills.  ?Eyes:  Negative for visual disturbance.  ?Musculoskeletal:  Positive for arthralgias, back pain, joint swelling and myalgias.  ?Skin:  Negative for color change.  ?Neurological:  Negative for weakness and numbness.  ? ?Objective: ?Vital Signs: LMP 03/03/2013  ? ?Specialty Comments:  ?No  specialty comments available. ? ?PMFS History: ?Patient Active Problem List  ? Diagnosis Date Noted  ? Arthralgia 07/19/2021  ? Immunosuppressed status ([place]) 07/19/2021  ? Eustachian tube dysfunction 06/13/2021  ? Osteoporosis 07/11/2020  ? Atherosclerosis of native coronary artery of native heart without angina pectoris 06/08/2019  ? Aortic atherosclerosis ([place]) 06/08/2019  ? Mixed hyperlipidemia 05/02/2019  ? Postural dizziness with presyncope 05/02/2019  ? HSV-2 (herpes simplex virus 2) infection 11/04/2018  ? Hypothyroidism s/p total thyroidectomy 1997 08/15/2018  ? Autoimmune hepatitis ([place]) 08/16/2012  ? Asthma 03/31/2012  ? Ulcerative colitis ([place]) 03/31/2012  ? History of renal calculi 03/31/2012  ? Migraines 03/31/2012  ? Mitral valve prolapse   ? ?  Past Medical History:  ?Diagnosis Date  ? Asthma   ? Chronic kidney disease   ? Hx of pancreatitis 06/16/2018  ? Kidney stone   ? Kidney stone   ? Migraine   ? Orthostatic hypotension   ? Osteopenia 03/31/2012  ? Recurrent upper respiratory infection (URI)   ? Ulcerative colitis ([place]) 03/31/2012  ? Urticaria   ?  ?Family History  ?Problem Relation Age of Onset  ? Cervical cancer Paternal Aunt   ? Heart disease Paternal Grandmother   ? Heart disease Mother   ? Dementia Mother   ? Thyroid disease Mother   ? Osteoarthritis Mother   ? Asthma Mother   ? Heart attack Father   ? Breast cancer Cousin   ? Heart attack Brother   ? Heart disease Brother   ?  ?Past Surgical History:  ?Procedure Laterality Date  ? CESAREAN SECTION    ? x3  ? CHOLECYSTECTOMY    ? COLONOSCOPY    ? LITHOTRIPSY    ? THYROIDECTOMY    ? 1997  ? ?Social History  ? ?Occupational History  ? Occupation: Self-employed  ?Tobacco Use  ? Smoking status: Never  ? Smokeless tobacco: Never  ?Vaping Use  ? Vaping Use: Never used  ?Substance and Sexual Activity  ? Alcohol use: No  ? Drug use: No  ? Sexual activity: Yes  ? ? ? ? ? ?

## 2021-09-20 NOTE — Telephone Encounter (Signed)
FYI ? ?Pt called stating she was not happy with Dr Raelyn Ensign visit. She is under the care of Dondra Prader now.  ? ?No further actions needed.  ?

## 2021-09-21 NOTE — Progress Notes (Signed)
Patient referred by [name], MD for abnormal echocardiogram.  Subjective:   Danielle Bond, female    DOB: July 26, 1960, 61 y.o.   MRN: [id number]   Chief Complaint  Patient presents with   Tachycardia   Follow-up    4 weeks    61 y.o. hispanic female with hyperlipidemia, aortic and coronary atherrosclerosis, presyncope, Graves' disease, status post thyroidectomy, ulcerative colitis, h/o acute pancreatitis, migraine.   Patient was seen in our office 08/28/2021 urgently with complaints of elevated heart rate.  Suspected this was secondary to infectious process, however started Lopressor 25 mg p.o. twice daily which caused patient dizziness, she therefore subsequently discontinued Lopressor.  Patient has also been following with Dr. Michiel Sites for management of thyroid dysfunction, which is likely contributing to patient's palpitations.   Patient now presents for 4 week follow up.  Patient is tolerating Lopressor 12.5 mg p.o. twice daily without issue.  She has had no recurrence of palpitations.  Patient continues to follow with multiple medical providers for work-up of pain and fatigue.  She also reports dyspnea on exertion.  Current Outpatient Medications on File Prior to Visit  Medication Sig Dispense Refill   albuterol (PROVENTIL) (2.5 MG/3ML) 0.083% nebulizer solution Take 3 mLs (2.5 mg total) by nebulization every 6 (six) hours as needed for wheezing or shortness of breath. 150 mL 1   albuterol (VENTOLIN HFA) 108 (90 Base) MCG/ACT inhaler TAKE 2 PUFFS BY MOUTH EVERY 6 HOURS AS NEEDED FOR WHEEZE OR SHORTNESS OF BREATH 25.5 each 1   budesonide-formoterol (SYMBICORT) 160-4.5 MCG/ACT inhaler Inhale 2 puffs into the lungs in the morning and at bedtime. 1 each 12   cholecalciferol (VITAMIN D3) 25 MCG (1000 UNIT) tablet Take 50,000 Units by mouth.     EPINEPHRINE 0.3 mg/0.3 mL IJ SOAJ injection INJECT 0.3 MLS (0.3 MG TOTAL) INTO THE MUSCLE AS NEEDED FOR ANAPHYLAXIS. 2 each 1    fluticasone (FLONASE) 50 MCG/ACT nasal spray PLACE 2 SPRAYS INTO BOTH NOSTRILS DAILY AS NEEDED FOR ALLERGIES OR RHINITIS. 48 mL 1   folic acid (FOLVITE) 1 MG tablet TAKE 1 TABLET BY MOUTH EVERY DAY FOR 30 DAYS 90 tablet 1   gabapentin (NEURONTIN) 100 MG capsule Take 1 capsule (100 mg total) by mouth 3 (three) times daily. Begin by taking 1 capsule daily at bedtime x 7 days. Then bid x 7 days. Then tid 60 capsule 0   HUMIRA PEN 40 MG/0.4ML PNKT every 14 (fourteen) days.      levothyroxine (SYNTHROID, LEVOTHROID) 112 MCG tablet Take 112 mcg by mouth daily before breakfast.     Magnesium 500 MG TABS Take 500 mg by mouth daily.     metoprolol tartrate (LOPRESSOR) 25 MG tablet Take 0.5 tablets (12.5 mg total) by mouth 2 (two) times daily. 60 tablet 3   Multiple Vitamin (MULTIVITAMIN WITH MINERALS) TABS tablet Take 1 tablet by mouth daily.     pantoprazole (PROTONIX) 40 MG tablet Take 40 mg by mouth daily.     Riboflavin 100 MG CAPS Take by mouth.     rosuvastatin (CRESTOR) 10 MG tablet TAKE 1 TABLET BY MOUTH EVERY DAY 90 tablet 3   triamcinolone cream (KENALOG) 0.1 % Apply 1 application topically daily as needed. 453 g 6   valACYclovir (VALTREX) 500 MG tablet Take 1 tablet by mouth as needed.     vitamin C (ASCORBIC ACID) 500 MG tablet Take 500 mg by mouth daily.     No current facility-administered medications on  file prior to visit.    Cardiovascular studies: EKG 09/25/2021: Normal sinus rhythm at a rate of 87 bpm.  Normal axis.  Nonspecific T wave abnormality, no evidence of ischemia or underlying injury pattern.  Compared EKG 08/28/2021, no longer tachycardic.  Ambulatory cardiac telemetry 01/17/2021- 01/23/2021: Predominant underlying rhythm was sinus with minimum heart rate 57 bpm, maximum heart rate under 63 bpm, average heart rate 86 bpm.  Rare PVCs and PACs.  Symptoms correlated with normal sinus rhythm.  No significant cardiac arrhythmia.  Exercise Sestamibi stress test 01/09/2021: Exercise  nuclear stress test was performed using Bruce protocol. Patient reached 7 METS, and 106% of age predicted maximum heart rate. Exercise capacity was low. No chest pain reported. Heart rate and hemodynamic response were normal. Stress EKG revealed no ischemic changes. Normal myocardial perfusion. Stress LVEF 54%. Low risk study.  EKG 12/21/2020:  Sinus rhythm at a rate of 89 bpm.  Normal axis.   Nonspecific T wave abnormality, unchanged compared to previous.  EKG 05/04/2020: Sinus rhythm 94 bpm Nonspecific T-wave abnormality  Echocardiogram 04/28/2019: Normal LV size and thickness. EF 55-60%. Impaired relaxation. Normal RV size and function. No significant valvular abnormality.   CTA chest 05/23/2018: No demonstrable pulmonary embolus. No thoracic aortic aneurysm or dissection. There is aortic atherosclerosis.  Right subclavian artery arises distally to the other great vessels, passing posteriorly to the esophagus and impressing upon the esophagus as it passes to the right.  No lung edema or consolidation.  No evident thoracic adenopathy. Aortic Atherosclerosis (ICD10-I70.0).  CXR 04/17/2019: Mild cardiomegaly without acute abnormality of the lungs.  CTA coronary angiogram 12/2017: pLAD minimal atherosclerosis. Rest normal. Calcium score 6.   Recent labs: CMP Latest Ref Rng & Units 08/21/2021 07/19/2021 04/18/2021  Glucose 70 - 99 mg/dL 108(H) 99 -  BUN 6 - 20 mg/dL 17 16 14   Creatinine 0.44 - 1.00 mg/dL 0.78 0.76 -  Sodium 135 - 145 mmol/L 138 139 -  Potassium 3.5 - 5.1 mmol/L 3.8 3.8 -  Chloride 98 - 111 mmol/L 104 104 -  CO2 22 - 32 mmol/L 24 26 -  Calcium 8.9 - 10.3 mg/dL 9.3 9.3 -  Total Protein 6.5 - 8.1 g/dL 8.3(H) 7.6 -  Total Bilirubin 0.3 - 1.2 mg/dL 0.8 0.9 -  Alkaline Phos 38 - 126 U/L 53 57 61  AST 15 - 41 U/L 25 26 31   ALT 0 - 44 U/L 14 17 33   CBC Latest Ref Rng & Units 08/21/2021 07/19/2021 04/18/2021  WBC 4.0 - 10.5 K/uL 5.9 5.5 6.0  Hemoglobin 12.0 - 15.0  g/dL 13.3 12.9 13.8  Hematocrit 36.0 - 46.0 % 40.9 38.8 43  Platelets 150 - 400 K/uL 238 239.0 295   Lipid Panel     Component Value Date/Time   CHOL 209 (H) 07/19/2021 0856   CHOL 168 05/02/2020 1024   TRIG 78.0 07/19/2021 0856   HDL 93.00 07/19/2021 0856   HDL 63 05/02/2020 1024   CHOLHDL 2 07/19/2021 0856   VLDL 15.6 07/19/2021 0856   LDLCALC 100 (H) 07/19/2021 0856   LDLCALC 90 05/02/2020 1024   HEMOGLOBIN A1C Lab Results  Component Value Date   HGBA1C 6.2 04/04/2021   TSH Recent Labs    04/04/21 1104 09/13/21 1148  TSH 2.61 4.84    07/11/2020: Hemoglobin 12.4, hematocrit 38.1, MCV 85.4, platelet 303 Glucose 111, BUN 13, creatinine 0.87, sodium 139, potassium 4.6,  05/02/2020: Chol 168, TG 82, HDL 63, LDL 90  01/18/2020: Chol  245, TG 126, HDL 75, LDL 148  Lipid panel 05/28/2019: Cholesterol 175, triglycerides 70, HDL 66, LDL 96.  Lipid panel 2017: Chol 184, TG 84, HDL 1813, LDL 84.  Lipid panel 2015: Chol 260, TG 73, HDL 59, LDL 193   Review of Systems  Cardiovascular:  Positive for dyspnea on exertion. Negative for chest pain, claudication, leg swelling, near-syncope, orthopnea, palpitations, paroxysmal nocturnal dyspnea and syncope.      Vitals:   09/25/21 1037  BP: 120/80  Pulse: 96  Resp: 16  Temp: 98.3 F (36.8 C)  SpO2: 98%     Body mass index is 26.56 kg/m. Filed Weights   09/25/21 1037  Weight: 136 lb (61.7 kg)     Objective:   Physical Exam Vitals reviewed.  Constitutional:      General: She is not in acute distress. Neck:     Vascular: No JVD.  Cardiovascular:     Rate and Rhythm: Normal rate and regular rhythm.     Pulses: Intact distal pulses.          Carotid pulses are 2+ on the right side and 2+ on the left side.      Radial pulses are 2+ on the right side and 2+ on the left side.       Dorsalis pedis pulses are 2+ on the right side and 2+ on the left side.       Posterior tibial pulses are 2+ on the right side and  2+ on the left side.     Heart sounds: Normal heart sounds, S1 normal and S2 normal. No murmur heard.   No gallop.  Pulmonary:     Effort: Pulmonary effort is normal. No respiratory distress.     Breath sounds: Normal breath sounds. No wheezing, rhonchi or rales.  Neurological:     Mental Status: She is alert.       Assessment & Recommendations:   61 y.o.hispanic female with hyperlipidemia, aortic and coronary atherrosclerosis, presyncope, Graves' disease, status post thyroidectomy, ulcerative colitis, h/o acute pancreatitis, migraine.   Patient has previously undergone cardiac evaluation with echocardiogram, ambulatory cardiac telemetry, and coronary CTA.  As well as stress test.  Cardiac evaluation has been relatively unremarkable with essentially normal echocardiogram and low risk stress test.  Cardiac monitor revealed PACs and PVCs.    Shortness of breath:  Suspect patient's shortness of breath to be multifactorial including asthma as well as deconditioning given patient's low exercise capacity on stress test. However given continued dyspnea on exertion will obtain echocardiogram  Palpitations:  Cardiac monitor reviewed rare PACs and PVCs, patient's symptoms correlated with normal sinus rhythm Advised patient to continue to follow with Dr. Michiel Sites for management of thyroid dysfunction Patient's palpitations have improved, continue Lopressor 12.5 mg p.o. twice daily  Hyperlipidemia: Currently managed by PCP. Continue Crestor I personally reviewed external labs, although LDL is 100, patient's HDL is 93.  Will not make changes to cholesterol management medications at this time.  Aorta and coronary atherosclerosis: Not on Aspirin due to h/o GI bleeding with ulcerative colitis.  Continue lipid management per PCP.  Elevated blood pressure without diagnosis of hypertension:  Will controlled both in the office and on home readings.   Follow-up in 6 months, sooner if needed, for  results of echocardiogram, dyspnea, palpitations.   Alethia Berthold, PA-C 09/25/2021, 11:23 AM Office: [phone]

## 2021-09-22 ENCOUNTER — Ambulatory Visit (INDEPENDENT_AMBULATORY_CARE_PROVIDER_SITE_OTHER): Payer: BC Managed Care – PPO | Admitting: Internal Medicine

## 2021-09-22 ENCOUNTER — Other Ambulatory Visit: Payer: Self-pay

## 2021-09-22 ENCOUNTER — Encounter: Payer: Self-pay | Admitting: Internal Medicine

## 2021-09-22 ENCOUNTER — Telehealth: Payer: Self-pay | Admitting: Internal Medicine

## 2021-09-22 VITALS — BP 132/86 | HR 96 | Temp 99.1°F | Ht 60.0 in | Wt 136.2 lb

## 2021-09-22 DIAGNOSIS — R769 Abnormal immunological finding in serum, unspecified: Secondary | ICD-10-CM

## 2021-09-22 DIAGNOSIS — R768 Other specified abnormal immunological findings in serum: Secondary | ICD-10-CM

## 2021-09-22 DIAGNOSIS — R053 Chronic cough: Secondary | ICD-10-CM

## 2021-09-22 DIAGNOSIS — J4540 Moderate persistent asthma, uncomplicated: Secondary | ICD-10-CM

## 2021-09-22 NOTE — Telephone Encounter (Signed)
Called and spoke with pt letting her know the info per MR. ? ?Pt wanted to go ahead and have a cxr done next week (3/6-3/10) if this is okay to be done per MR. ? ?Pt is wondering if what MR stated about recent CT could be the reason why she was having a lot of pressure on the left side of her chest and this is why she wants to go ahead and have a cxr done sooner. ? ?MR, please advise. ?

## 2021-09-22 NOTE — Telephone Encounter (Signed)
Raquel Sarna ? ? ?Let Charlaine Dalton know that after she leftI visualized the CTA . The radiologist is callig some basal ground glass but I think is just inspriation related atelectasis ? ?Plan ? - to be on safe sidde CXR 2 view I 2-6 months ? ? ? ?SIGNATURE  ? ? ?Dr. Brand Males, M.D., F.C.C.P,  ?Pulmonary and Critical Care Medicine ?Staff Physician, [facility] ?Center Director - Interstitial Lung Disease  Program  ?[facility] ?[place], [place], [address] ? ?NPI Number:  NPI #[id number] ?DEA Number: [id number] ? ?Pager: [phone], If no answer  -> Check AMION or Try [phone] ?Telephone (clinical office): [phone] ?Telephone (research): [phone] ? ?9:35 AM ?09/22/2021 ? ?

## 2021-09-22 NOTE — Patient Instructions (Addendum)
ICD-10-CM   ?1. Chronic cough  R05.3   ?2. Irritable larynx  J38.7   ?3. Moderate persistent asthma without complication  O83.25   ?4. Elevated IgE level  R76.8   ?5. Positive allergy test  R76.9   ? ?Glad cough is under control ?Allergy season coming up and will be helpful to monitor this ?Groundglass opacities on the CT angiogram chest February 2023 likely atelectasis ? ?Plan ?-[place]ue symbicort as before ?-Albuerol as needed ? ?Follow-up ?-6 months or sooner if needed; ?-  can do FeNO and ACT at followup ? -Do chest x-ray and 2-6 months ?

## 2021-09-22 NOTE — Progress Notes (Signed)
PCP Jani Gravel, MD  Neurologist-Dr. Krista Blue Infectious diseases Dr. Michel Bickers Orthopedic for right shoulder-Dr. Hildred Priest 06/18/2018  Chief Complaint  Patient presents with   Pulmonary Consult    asthma, [name], NP referred her, she went to the ER and she had bronchitis with fever, she has constant fever and she has seen Infectious DIsease for this, she has a dry hacking cough    61 year old female with a chronic complex set of medical problems that includes hyperlipidemia, Graves' disease, status post thyroidectomy, ulcerative colitis, kidney stone and history of acute pancreatitis.  She also has long history of migraine headaches.  She presents with her husband for respiratory issues in the context of fever of unknown origin and bloody stools  She tells me that she was diagnosed with ulcerative colitis in [place] in 2007 when the used to live [place].  In 2013 she migrated to [place], [place] and had a bad flareup of ulcerative colitis for which she was admitted.  Since then she has been under the care of Dr. Collene Mares gastroenterologist and she has been on Lialda.  Overall she is doing really wel0 .then some few years ago she was started on Brio for diagnosis of asthma.  Details of this is unclear.  Overall doing well since then till September 2019 when she felt the Lialda stopped working on a subjective basis.  This is because since then every time she takes the Lialda she has abdominal pain.  She also has fever at night on a daily basis.  She also has bloody stools at least 7 times a day.  She has abdominal pain when she takes the [place].  Per her report Dr. Collene Mares has done a colonoscopy with biopsy that showed inflammation.  Therefore biologic is being considered.  However she is also increasing fatigue and shortness of breath associated with it.  Is mostly present on exertion relieved by rest.  Is also wheezing.  Overall she has had a significant deterioration.   Then towards the end of October 2019 she felt she had an asthma flareup and reported to the urgent care center where she was treated acutely.  But she represented within a week or 10 days to the ER and was given a diagnosis of acute bronchitis.  CT angiogram ruled out pulmonary embolism.  All these records have been reviewed by me and the images and results visualized.  She subsequently has seen Dr. Michel Bickers infectious diseases this week.  I reviewed his findings.  He is worried that the Lialda is causing eosinophilia.  He does not suspect infectious disease as a reason for the fever quantified on gold and HIV are negative.  Further blood results are pending.  Patient says she has had an extensive work-up.  She is also seen [facility] rheumatology department Dr. Joellyn Rued who apparently has done an extensive autoimmune panel and this is all negative.  Unclear is if vasculitis work-up has been done that includes ANCA and GBM.  Patient is really frustrated by the whole constellation of symptoms.  At this point in time a biologic agent is being considered for ulcerative colitis but apparently the fever is being considered is unusual in the setting of ulcerative colitis and therefore she is going through the work-up  Exam nitric oxide is 21 ppb and this is on Breo today and is normal.  Recent labs June 12, 2018 done at [facility] rheumatology: Sjogren antibody centromere  antibody, Jo 1 antibody, chromatin antibody, SSA, SSB, anti-scleroderma antibody, Smith antibody, double-stranded DNA antibody and RNP antibody and ESR all normal.  TSH is normal.  Hemoglobin is 12 g%.  Your sniffles continue to be high at 13.6% of her total white count of 7400.  Creatinine is normal.  Urine analysis appears normal.  Results for KEVONA, LUPINACCI (MRN [id number]) as of 06/18/2018 09:18  Ref. Range 11/27/2013 12:40 03/10/2014 18:52 03/16/2014 16:42 05/23/2018 05:31 06/16/2018 15:10   Eosinophils Absolute Latest Ref Range: 15 - 500 cells/uL 0.2 0.4 0.2 0.6 (H) 385   Results for AALYAH, MANSOURI (MRN [id number]) as of 06/18/2018 09:18  Ref. Range 10/05/2013 14:36 03/16/2014 16:42  Anti Nuclear Antibody(ANA) Latest Ref Range: NEGATIVE  POS (A)   ANA Pattern 1 Unknown HOMOGENOUS (A)   ANA Titer 1 Latest Ref Range: <1:40   1:1280 (H)   ds DNA Ab Latest Units: IU/mL  1  C3 Complement Latest Ref Range: 90 - 180 mg/dL  116  C4 Complement Latest Ref Range: 10 - 40 mg/dL  18   Most recent CT scan of the chest angiogram May 23, 2018: Personally visualized.  Lung fields look fairly benign.  OV 07/29/2018  Subjective:  Patient ID: [name], female , DOB: 1961-02-26 , age 75 y.o. , MRN: [id number] , ADDRESS: [address] [place] [id number]   07/29/2018 -   Chief Complaint  Patient presents with   Follow-up    Pt states she is doing better since last visit. States she still has an occ dry cough but nothing like last visit. Pt states she does have some SOB/chest tightness today due to the weather.     HPI Myliyah [name] 61 y.o. -presents with her husband for chronic cough and fever of unknown origin in the setting of ulcerative colitis and Lialda treatment  After her last visit we reviewed autoimmune panel from primary rheumatologist and this was negative.  We performed vasculitis panel and this was negative.  In the interim she is seen infectious disease specialist Dr. Megan Salon and apparently no infection present for her fever of unknown origin.  Discussions were held with Dr. Collene Mares.  She also discussed with a gastroenterologist at [facility] and the thought process was that maybe the fevers are indeed coming from ulcerative colitis although this is very rare.  This is because autoimmune, vasculitis and infectious work-up was negative.  On July 01, 2018 patient's Lialda was stopped and she has been prescribed budesonide oral treatment.  Subsequently after  this within a few weeks her cough is significantly improved.  She not having any more night sweats other than one at night.  This is 80% better.  And she is also feeling a lot better.  Apparently starting Entyvio biologic for ulcerative colitis is in discussion but she is somewhat got trepidation about this given the fact it is an infusion.  She will meet with Dr. Collene Mares about this.  Overall at this point she is feeling well no respiratory complaints other than the mild cough.  She is asking for referral to primary care physician.  Her current primary care physician is apparently out of the office for an extended period of time not otherwise specified.   OV 04/09/2019  Subjective:  Patient ID: [name], female , DOB: 1960/07/24 , age 50 y.o. , MRN: [id number] , ADDRESS: [address] [place] [address]   04/09/2019 -  chronic cough and fever of unknown origin in the setting  of ulcerative colitis and Lialda treatment    HPI Leyli Kevorkian 61 y.o. - cough was resolved. Now back 1.5 weeks ago. Dry cough. Hacky cough. Sometimes she does bring sputum. Annoying. Has on and off migraine headaches. No fever, No hemoptysis . No anosmia. No ageusia. No covid exposures but daughter had strep throat 2 weeks ago (confirmed strep with confirmed negative covid). No sore throat. Throat does feel itchy.  Does NOT want to do antibiotics. Doing low risk covid activities only  Re UC - doing Humira by Dr Therisa Doyne since October 01, 2018 - q 2 weeks. .  Fevers went away in dec 2019. UC doing ok  OV 04/16/2019  Subjective:  Patient ID: [name], female , DOB: Dec 14, 1960 , age 10 y.o. , MRN: [id number] , ADDRESS: [address] [place] [address]   04/16/2019 -   Chief Complaint  Patient presents with   Follow-up    Pt c/o prod cough with yellow mucus x 2 weeks. Pt also c/o left sided discomfort on lateral chest with inspiration. Pt states due to the discomfort she feels she cant take a full breath  in. Pt denies f/c/s and swelling.    Acute visit  HPI Jlyn Cerros 61 y.o. -is here for acute visit.  Originally seen in  Nov2019 for fever of unknown origin.  At that time ulcerative colitis was suspected along with Lialda because she had some eosinophilia.  She has been on Breo.  She tells me that her fever and cough broke and it was unrelated to stopping Lialda.  She then switched gastroenterologist and has been on care with Dr. Therisa Doyne.  She is on Humira but she tells me the fever and cough stopped even before she went on Humira.  She had been doing well.  Approximately 3 weeks ago or so daughter developed strep throat [apparently COVID negative].  Then approximately 2 weeks ago she developed a cough.  We did do a telephone visit.  At that time she opted for expectant follow-up.  She is here acutely because the cough is persistent.  She also has some yellow sputum.  A few days ago she did feel like a muscle pull in the left infra-axillary area with the cough.  This is resolved.  Early this morning she had very mild symptoms of the same.  Therefore she is here but there is no wheezing or shortness of breath.  There is no pleuritic chest pain.  There is no hemoptysis no pedal edema.  She continues with the Brio.  We did do a COVID antigen testing/PCR testing and this was negative on 10 April 2019.  In May 2020 her COVID serology for IgG was negative.  She is interested in a chest x-ray and empiric treatment with antibiotics.  We are unable to do an exhaled nitric oxide test because of the national [place] emergency and local site restrictions.   COVID negative 04/10/2019 - negtaive Results for RISHIKA, MCCOLLOM (MRN [id number]) as of 04/16/2019 15:48  Ref. Range 12/17/2018 09:18  SARS CoV2 AB IGG Unknown NEGATIVE    Results for DEMONICA, FARREY (MRN [id number]) as of 04/16/2019 15:48  Ref. Range 06/18/2018 10:17 06/26/2018 01:15 06/26/2018 14:29  Eosinophils Absolute Latest Ref Range: 15 - 500 cells/uL 0.9  (H) 0.4 535 (H)  Results for ZOEE, HEENEY (MRN [id number]) as of 04/16/2019 15:48  Ref. Range 06/18/2018 10:17  IgE (Immunoglobulin E), Serum Latest Ref Range: <OR=114 kU/L 74   Results for VALEN, MASCARO (MRN [id number])  as of 04/16/2019 15:48  Ref. Range 08/15/2012 19:14 10/05/2013 14:36 03/16/2014 16:42 06/26/2018 14:29 07/02/2018 10:42  Anti Nuclear Antibody (ANA) Latest Ref Range: Negative   POS (A)   Negative  ANA Pattern 1 Unknown  HOMOGENOUS (A)     ANA Titer 1 Latest Ref Range: <1:40    1:1280 (H)     ds DNA Ab Latest Units: IU/mL   1    GBM Ab Latest Units: AI    <1.0 <1.0  Mitochondrial M2 Ab, IgG Latest Ref Range: <0.91  1.14 (H)      Myeloperoxidase Abs Latest Units: AI     <1.0  Serine Protease 3 Latest Units: AI     <1.0  S cerevisiae IgG ab Latest Ref Range: <=20.0 Units 12.7      S cerevisiae IgA ab Latest Ref Range: <=20.0 Units 18.3       ROS  October 07, 2019:.  Visit with nurse practitioner Tammy Parrott  61 year old female never smoker followed for asthma, eosinophilia (possibly secondary to Lialda )  Medical history significant for Graves' disease, ulcerative colitis (now on Humira) and pancreatitis Today's video visit is for 4 week follow up for asthma and chronic cough .  Last visit patient was having increased cough.  She was changed from [facility] to Symbicort.  She says she is feeling better.  Feels that she can take in a clear breath and also cough has decreased.  She is practicing vocal cord relaxation techniques.  Feels that this is also helping.  She is using saline nasal rinses and this also seems to decrease her nasal congestion and postnasal drip.  She says overall she is feeling better.    OV 12/17/2019  Subjective:  Patient ID: [name], female , DOB: 04-15-1961 , age 61 y.o. , MRN: [id number] , ADDRESS: [address] [place] [address]   12/17/2019 -   Chief Complaint  Patient presents with   Follow-up    Cough     HPI Tiffanny Lamarche 61  y.o. -presents for evaluation of chronic cough and follow-up.  After last saw her nurse practitioner saw her and it is documented above.  This was in March 2021.  Her symptoms had improved with cough neuropathy advised.  Patient was reluctant to undergo gabapentin.  She now returns for follow-up.  She tells me the cough is deteriorated with the change in weather from spring to summer and the humidity.  She feels she has asthma.  This is despite taking Symbicort.  She demonstrated a cough for me and it has a laryngeal quality.  She admits that throat is very dry and she feels it to be itchy.  There is also a tickle in the throat.  Cough does not bother her at night when she sleeping.  Otherwise no new problems.  Overall she feels her health is improved and her new immunosuppression is working well.   OV 05/24/2020  Subjective:  Patient ID: [name], female , DOB: [birth year] , age 69 y.o. , MRN: [id number] , ADDRESS: [address] [place] [address] PCP [name], MD Patient Care Team: [name], MD as PCP - General (Family Medicine) Lahoma Rocker, MD as Consulting Physician (Rheumatology) Marshell Garfinkel, MD as Consulting Physician (Pulmonary Disease) Brand Males, MD as Consulting Physician (Pulmonary Disease) Jacelyn Pi, MD as Referring Physician (Endocrinology) Jolene Provost, PA-C as Physician Assistant (Physician Assistant) Delsa Bern, MD as Consulting Physician (Obstetrics and Gynecology)  This Provider for  this visit: Treatment Team:  Attending Provider: Brand Males, MD    05/24/2020 -   Chief Complaint  Patient presents with   Follow-up    Chronic cough, Asthma     HPI Satomi Buda 61 y.o. -presents for follow-up of her residual chronic cough.  Last seen in May 2021.  At that time was concerned about residual  coughing due to cough neuropathy.  Patient is reluctant to add more medications.  We subjected her to allergy work-up.  She  is here to review the results.  Her IgE is elevated.  She has a dog in the house and dog dander is significantly positive.  She is also allergic to few other things documented below.  She states that her cough happens during the spring and the fall consistent with the allergy season.  She feels that the allergies are driving this.  She is again somewhat reluctant to have more medications.  We discussed potential biologic therapies for asthma.  She is open to listening to this.  But she is also interested in like other options.  She is willing to see an allergist.  Reviewed her vaccination status.  She is up-to-date with her vaccines but she could benefit from a pneumonia vaccine especially because she is immunosuppressed and she tells me it is greater than 10 years since she took her pneumonia vaccine not otherwise specified.   Lab Results  Component Value Date   NITRICOXIDE 18 02/02/2020   PFT Results Latest Ref Rng & Units 12/17/2019  FVC-Pre L 2.15  FVC-Predicted Pre % 75  FVC-Post L 2.12  FVC-Predicted Post % 74  Pre FEV1/FVC % % 88  Post FEV1/FCV % % 89  FEV1-Pre L 1.89  FEV1-Predicted Pre % 85  FEV1-Post L 1.90  DLCO uncorrected ml/min/mmHg 19.37  DLCO UNC% % 109  DLCO corrected ml/min/mmHg 19.37  DLCO COR %Predicted % 109  DLVA Predicted % 136  TLC L 3.39  TLC % Predicted % 76  RV % Predicted % 66   Results for KAILIN, PRINCIPATO (MRN [id number]) as of 05/24/2020 10:17  Ref. Range 12/17/2019 10:45  Sheep Sorrel IgE Latest Units: kU/L <0.10  Pecan/Hickory Tree IgE Latest Units: kU/L <0.10  IgE (Immunoglobulin E), Serum Latest Ref Range: <OR=114 kU/L 158 (H)  Allergen, D pternoyssinus,d7 Latest Units: kU/L <0.10  Cat Dander Latest Units: kU/L 2.89 (H)  Dog Dander Latest Units: kU/L 22.00 (H)  [place] Grass Latest Units: kU/L <0.10  Johnson Grass Latest Units: kU/L <0.10  Timothy Grass Latest Units: kU/L 0.16 (H)  Cockroach Latest Units: kU/L <0.10  Aspergillus fumigatus, m3 Latest  Units: kU/L <0.10  Allergen, Comm Silver Wendee Copp, t9 Latest Units: kU/L 1.71 (H)  Allergen, Cottonwood, t14 Latest Units: kU/L <0.10  Elm IgE Latest Units: kU/L <0.10  Allergen, Mulberry, t76 Latest Units: kU/L <0.10  Allergen, Oak,t7 Latest Units: kU/L 0.88 (H)  COMMON RAGWEED (SHORT) (W1) IGE Latest Units: kU/L 0.87 (H)  Allergen, Mouse Urine Protein, e78 Latest Units: kU/L <0.10  D. farinae Latest Units: kU/L <0.10  Allergen, Cedar tree, t12 Latest Units: kU/L <0.10  Box Elder IgE Latest Units: kU/L <0.10  Rough Pigweed  IgE Latest Units: kU/L <0.10   Results for KEIONA, JENISON (MRN [id number]) as of 05/24/2020 10:17  Ref. Range 12/17/2019 10:45  IgE (Immunoglobulin E), Serum Latest Ref Range: <OR=114 kU/L 158 (H)   ROS - per HPI     06/27/2021 Acute OV : Asthma     female never smoker followed  for moderate persistent asthma with an allergic phenotype, and chronic cough.  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  Graves disease, ulcerative colitis on Humira and pancreatitis  Patient presents for an acute office visit.  Complains of cough x2 weeks. Initially had ear infection 2 weeks ago, treated by ENT with Zpack. Was exposed to possible flu cases with her granddaughter over the holiday. She developed body aches, chills, sweats , cough, congestion , joint pains. That has improved but continues to have intermittent cough and wheezing /tightness. Increased use of albuterol . Patient remains on Symbicort twice daily.  Flu shot utd, Covid vaccine x 4 .  No chest pain, orthopnea or edema. Appetite is good.  Can not take high dose steroids or shots, can take low dose without issues.   OV 09/22/2021  Subjective:  Patient ID: Charlaine Dalton, female , DOB: 07/28/1960 , age 54 y.o. , MRN: [id number] , ADDRESS: [address] [place] [id number] PCP [name], MD Patient Care Team: [name], MD as PCP - General (Family Medicine) Lahoma Rocker, MD  as Consulting Physician (Rheumatology) Marshell Garfinkel, MD as Consulting Physician (Pulmonary Disease) Brand Males, MD as Consulting Physician (Pulmonary Disease) Jacelyn Pi, MD as Referring Physician (Endocrinology) Jolene Provost, PA-C as Physician Assistant (Physician Assistant) Delsa Bern, MD as Consulting Physician (Obstetrics and Gynecology) Ronnette Juniper, MD as Consulting Physician (Gastroenterology)  This Provider for this visit: Treatment Team:  Attending Provider: Brand Males, MD    09/22/2021 -   Chief Complaint  Patient presents with   Follow-up    Pt states that her cough is better compared to last visit. Denies any complaints of SOB, chest discomfort, or wheezing.   Follow-up chronic cough with component of cough neuropathy and also allergic asthma on Symbicort Immunosuppressed because of Humira and due to ulcerative colitis   HPI ZARIANNA DICARLO 61 y.o. -returns for follow-up.  I personally saw her in November 2021.  At this point in time in follow-up her cough is completely resolved.  She is not bothered by it.  She continues her Symbicort.  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  That also has resolved.  She took a short course prednisone with the nurse practitioner.  Chest x-ray 06/27/2021 is clear.    However she and her husband report that the last few months has been very challenging for her.  She says that around Thanksgiving 2022 she had urine infection and it cleared but then she got it again and then got antibiotics and it cshe started developing pain around her ankle, knee, wrist, fingers and bilateral shoulders.  Associated with diminished range of motion.  She saw Dr. Ival Bible rheumatologist and now no longer her rheumatologist].  ANA test and apparently Humira antibody test were negative.  He recommended medications but she did not align with this.  She then followed up with GI Dr.  Deno Etienne around 08/21/2021.  She ended up in the emergency department with dehydration and requiring fluids.  She had tachycardia heart rate 130.  She was given Ceftin and nonsteroidal anti-inflammatory drug but she opted not to take it.  She just took eardrops and then started feeling better.  Apparently the D-dimer was high.  2 days later her primary care physician did CT angiogram and duplex of the lower extremities and this was negative for DVT.  She also followed up with cardiology and  was given Lopressor for tachycardia which is working well.  She also saw [facility] and had nerve conduction study that was negative.  She subsequently switched to Dr. Sharol Given and saw his PA.  She had an amazing experience there.  AShe has shoulder frozen.  She got cortisone shot and after that her insomnia and the challenges coming with all this knee pain have improved significantly and she is feeling much better.  Chest x-ray December 2022: Clear EKG  CT Chest data 08/23/2021 and personally visualized    EXAM: CT ANGIOGRAPHY CHEST WITH CONTRAST   TECHNIQUE: Multidetector CT imaging of the chest was performed using the standard protocol during bolus administration of intravenous contrast. Multiplanar CT image reconstructions and MIPs were obtained to evaluate the vascular anatomy.   RADIATION DOSE REDUCTION: This exam was performed according to the departmental dose-optimization program which includes automated exposure control, adjustment of the mA and/or kV according to patient size and/or use of iterative reconstruction technique.   CONTRAST:  81m OMNIPAQUE IOHEXOL 350 MG/ML SOLN   COMPARISON:  06/15/2019   FINDINGS: Cardiovascular: Heart size upper limits normal. No pericardial effusion. The RV is nondilated. Satisfactory opacification of pulmonary arteries noted, and there is no evidence of pulmonary emboli. There is good contrast opacification of the thoracic aorta. No dissection or stenosis.  Calcified atheromatous plaque in the arch. 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 Mild atheromatous plaque in the proximal visualized abdominal aorta which is nondilated.   Mediastinum/Nodes: No mass or adenopathy. Surgical clips from thyroidectomy.   Lungs/Pleura: No pleural effusion. Patchy poorly marginated ground-glass opacities predominantly in dependent aspect of both lung bases.   Upper Abdomen: No acute findings.   Musculoskeletal: No chest wall abnormality. No acute or significant osseous findings.   Review of the MIP images confirms the above findings.   IMPRESSION: 1.  No acute PE or thoracic aortic dissection.     Electronically Signed   By: DLucrezia EuropeM.D.   On: 08/23/2021 12:59     Result History    No results found.    PFT  PFT Results Latest Ref Rng & Units 12/17/2019  FVC-Pre L 2.15  FVC-Predicted Pre % 75  FVC-Post L 2.12  FVC-Predicted Post % 74  Pre FEV1/FVC % % 88  Post FEV1/FCV % % 89  FEV1-Pre L 1.89  FEV1-Predicted Pre % 85  FEV1-Post L 1.90  DLCO uncorrected ml/min/mmHg 19.37  DLCO UNC% % 109  DLCO corrected ml/min/mmHg 19.37  DLCO COR %Predicted % 109  DLVA Predicted % 136  TLC L 3.39  TLC % Predicted % 76  RV % Predicted % 66       has a past medical history of Asthma, Chronic kidney disease, pancreatitis (06/16/2018), Kidney stone, Kidney stone, Migraine, Orthostatic hypotension, Osteopenia (03/31/2012), Recurrent upper respiratory infection (URI), Ulcerative colitis ([place] (03/31/2012), and Urticaria.   reports that she has never smoked. She has never used smokeless tobacco.  Past Surgical History:  Procedure Laterality Date   CESAREAN SECTION     x3   CHOLECYSTECTOMY     COLONOSCOPY     LITHOTRIPSY     THYROIDECTOMY     1997    Allergies  Allergen Reactions   Almond Oil Hives and Shortness Of Breath   Lialda [Mesalamine] Other (See Comments)    Vomiting and rectal bleeding    Other Anaphylaxis    Almonds, tree nuts   Aspirin Hives and Nausea Only    Stomach cramps  Hydromorphone Hcl Other (See Comments)    Caused Enzyme levels to go up   Penicillins Hives and Swelling    Has patient had a PCN reaction causing immediate rash, facial/tongue/throat swelling, SOB or lightheadedness with hypotension: Yes Has patient had a PCN reaction causing severe rash involving mucus membranes or skin necrosis: No Has patient had a PCN reaction that required hospitalization: No Has patient had a PCN reaction occurring within the last 10 years: No  Stomach cramps If all of the above answers are "NO", then may proceed with Cephalosporin use.    Acetaminophen Other (See Comments)    Has autoimmune hepatitis, wants to avoid APAP   Apriso [Mesalamine Er] Nausea And Vomiting   Corticosteroids Other (See Comments)    Had acute pancreatitis x 2 after steroid use.   Imitrex [Sumatriptan] Swelling    Injection caused swelling   Stadol [Butorphanol] Other (See Comments)    hallucinates   Topamax [Topiramate] Other (See Comments)    Kidney stones   Vancomycin Hives   Azathioprine Rash   Pepcid [Famotidine] Nausea And Vomiting    Makes acid worse   Prednisone Nausea And Vomiting    All steroids causes nausea and acute pancreatitis     Immunization History  Administered Date(s) Administered   Fluad Quad(high Dose 65+) 04/01/2021   Influenza Inj Mdck Quad Pf 03/23/2018   Influenza Inj Mdck Quad With Preservative 05/01/2019   Influenza Split 04/10/2012, 04/24/2013   Influenza, Seasonal, Injecte, Preservative Fre 04/15/2014   Influenza,inj,quad, With Preservative 05/02/2015   Influenza-Unspecified 04/22/2020   Moderna Sars-Covid-2 Vaccination 10/09/2019, 11/09/2019, 05/23/2020   PPD Test 10/05/2013   Pneumococcal Polysaccharide-23 04/22/2008, 04/23/2013, 05/24/2020   Tdap 07/23/2004, 07/22/2012   Varicella 07/22/2012    Family History  Problem Relation Age of Onset    Cervical cancer Paternal Aunt    Heart disease Paternal Grandmother    Heart disease Mother    Dementia Mother    Thyroid disease Mother    Osteoarthritis Mother    Asthma Mother    Heart attack Father    Breast cancer Cousin    Heart attack Brother    Heart disease Brother      Current Outpatient Medications:    albuterol (PROVENTIL) (2.5 MG/3ML) 0.083% nebulizer solution, Take 3 mLs (2.5 mg total) by nebulization every 6 (six) hours as needed for wheezing or shortness of breath., Disp: 150 mL, Rfl: 1   albuterol (VENTOLIN HFA) 108 (90 Base) MCG/ACT inhaler, TAKE 2 PUFFS BY MOUTH EVERY 6 HOURS AS NEEDED FOR WHEEZE OR SHORTNESS OF BREATH, Disp: 25.5 each, Rfl: 1   budesonide-formoterol (SYMBICORT) 160-4.5 MCG/ACT inhaler, Inhale 2 puffs into the lungs in the morning and at bedtime., Disp: 1 each, Rfl: 12   cholecalciferol (VITAMIN D3) 25 MCG (1000 UNIT) tablet, Take 50,000 Units by mouth., Disp: , Rfl:    EPINEPHRINE 0.3 mg/0.3 mL IJ SOAJ injection, INJECT 0.3 MLS (0.3 MG TOTAL) INTO THE MUSCLE AS NEEDED FOR ANAPHYLAXIS., Disp: 2 each, Rfl: 1   fluticasone (FLONASE) 50 MCG/ACT nasal spray, PLACE 2 SPRAYS INTO BOTH NOSTRILS DAILY AS NEEDED FOR ALLERGIES OR RHINITIS., Disp: 48 mL, Rfl: 1   folic acid (FOLVITE) 1 MG tablet, TAKE 1 TABLET BY MOUTH EVERY DAY FOR 30 DAYS, Disp: 90 tablet, Rfl: 1   HUMIRA PEN 40 MG/0.4ML PNKT, every 14 (fourteen) days. , Disp: , Rfl:    levothyroxine (SYNTHROID, LEVOTHROID) 112 MCG tablet, Take 112 mcg by mouth daily before breakfast., Disp: , Rfl:  Magnesium 300 MG CAPS, Take 500 mg by mouth daily., Disp: , Rfl:    metoprolol tartrate (LOPRESSOR) 25 MG tablet, Take 0.5 tablets (12.5 mg total) by mouth 2 (two) times daily., Disp: 60 tablet, Rfl: 3   Multiple Vitamin (MULTIVITAMIN WITH MINERALS) TABS tablet, Take 1 tablet by mouth daily., Disp: , Rfl:    pantoprazole (PROTONIX) 40 MG tablet, Take 40 mg by mouth daily., Disp: , Rfl:    Riboflavin 100 MG CAPS,  Take by mouth., Disp: , Rfl:    rosuvastatin (CRESTOR) 10 MG tablet, TAKE 1 TABLET BY MOUTH EVERY DAY, Disp: 90 tablet, Rfl: 3   triamcinolone cream (KENALOG) 0.1 %, Apply 1 application topically daily as needed., Disp: 453 g, Rfl: 6   valACYclovir (VALTREX) 500 MG tablet, Take 1 tablet by mouth as needed., Disp: , Rfl:    vitamin C (ASCORBIC ACID) 500 MG tablet, Take 500 mg by mouth daily., Disp: , Rfl:    gabapentin (NEURONTIN) 100 MG capsule, Take 1 capsule (100 mg total) by mouth 3 (three) times daily. Begin by taking 1 capsule daily at bedtime x 7 days. Then bid x 7 days. Then tid (Patient not taking: Reported on 09/22/2021), Disp: 60 capsule, Rfl: 0      Objective:   Vitals:   09/22/21 0847  BP: 132/86  Pulse: 96  Temp: 99.1 F (37.3 C)  TempSrc: Oral  SpO2: 98%  Weight: 136 lb 3.2 oz (61.8 kg)  Height: 5' (1.524 m)    Estimated body mass index is 26.6 kg/m as calculated from the following:   Height as of this encounter: 5' (1.524 m).   Weight as of this encounter: 136 lb 3.2 oz (61.8 kg).  _0 @  Filed Weights   09/22/21 0847  Weight: 136 lb 3.2 oz (61.8 kg)     Physical Exam    General: No distress. Looks well Neuro: Alert and Oriented x 3. GCS 15. Speech normal Psych: Pleasant Resp:  Barrel Chest - no.  Wheeze - no, Crackles - no, No overt respiratory distress CVS: Normal heart sounds. Murmurs - no Ext: Stigmata of Connective Tissue Disease - no HEENT: Normal upper airway. PEERL +. No post nasal drip        Assessment:       ICD-10-CM   1. Chronic cough  R05.3 Nitric oxide    2. Moderate persistent asthma without complication  T90.30     3. Elevated IgE level  R76.8     4. Positive allergy test  R76.9          Plan:     Patient Instructions     ICD-10-CM   1. Chronic cough  R05.3   2. Irritable larynx  J38.7   3. Moderate persistent asthma without complication  S92.33   4. Elevated IgE level  R76.8   5. Positive allergy test   R76.9    Glad cough is under control Allergy season coming up and will be helpful to monitor this [place] opacities on the CT angiogram chest February 2023 likely atelectasis  Plan -Cotninue symbicort as before -Albuerol as needed  Follow-up -6 months or sooner if needed; -  can do FeNO and ACT at followup  -Do chest x-ray and 2-6 months  (Level 04: Estb 30-39 min   min  visit type: on-site physical face to visit visit spent in total care time and counseling or/and coordination of care by this undersigned MD - Dr Brand Males. This includes one or  more of the following on this same day 09/22/2021: pre-charting, chart review, note writing, documentation discussion of test results, diagnostic or treatment recommendations, prognosis, risks and benefits of management options, instructions, education, compliance or risk-factor reduction. It excludes time spent by the [place] or office staff in the care of the patient . Actual time is 35 min)   SIGNATURE    Dr. Brand Males, M.D., F.C.C.P,  Pulmonary and Critical Care Medicine Staff Physician, [place] Director - Interstitial Lung Disease  Program  Pulmonary [place] [facility] [place], [place], [address]  Pager: [phone], If no answer or between  15:00h - 7:00h: call [phone] Telephone: [phone]  9:31 AM 09/22/2021

## 2021-09-25 ENCOUNTER — Encounter: Payer: Self-pay | Admitting: Student

## 2021-09-25 ENCOUNTER — Ambulatory Visit: Payer: BC Managed Care – PPO | Admitting: Student

## 2021-09-25 ENCOUNTER — Other Ambulatory Visit: Payer: Self-pay

## 2021-09-25 VITALS — BP 120/80 | HR 96 | Temp 98.3°F | Resp 16 | Ht 60.0 in | Wt 136.0 lb

## 2021-09-25 DIAGNOSIS — I495 Sick sinus syndrome: Secondary | ICD-10-CM | POA: Diagnosis not present

## 2021-09-25 DIAGNOSIS — R0609 Other forms of dyspnea: Secondary | ICD-10-CM | POA: Diagnosis not present

## 2021-09-25 DIAGNOSIS — R002 Palpitations: Secondary | ICD-10-CM | POA: Diagnosis not present

## 2021-09-26 ENCOUNTER — Telehealth: Payer: Self-pay | Admitting: Family

## 2021-09-26 NOTE — Telephone Encounter (Signed)
FYI please see below.  ?

## 2021-09-26 NOTE — Telephone Encounter (Signed)
Danielle Bond wants to inform Danielle Bond that the cortisone shot she received last week has helped with pain and she has been able to sleep without using Gabapentin (she was nervous about side-effects of Gabapentin she has not used it) ?

## 2021-09-27 ENCOUNTER — Other Ambulatory Visit: Payer: Self-pay

## 2021-09-27 ENCOUNTER — Ambulatory Visit (INDEPENDENT_AMBULATORY_CARE_PROVIDER_SITE_OTHER): Payer: BC Managed Care – PPO | Admitting: Physician Assistant

## 2021-09-27 ENCOUNTER — Encounter: Payer: Self-pay | Admitting: Physician Assistant

## 2021-09-27 DIAGNOSIS — L2084 Intrinsic (allergic) eczema: Secondary | ICD-10-CM | POA: Diagnosis not present

## 2021-09-27 NOTE — Progress Notes (Signed)
? ?  Follow-Up Visit ?  ?Subjective  ?Danielle Bond is a 61 y.o. female who presents for the following: Follow-up (On right foot- "better"  with triamcinalone cream). ? ? ?The following portions of the chart were reviewed this encounter and updated as appropriate:  Tobacco  Allergies  Meds  Problems  Med Hx  Surg Hx  Fam Hx   ?  ? ?Objective  ?Well appearing patient in no apparent distress; mood and affect are within normal limits. ? ?A focused examination was performed including feet. Relevant physical exam findings are noted in the Assessment and Plan. ? ?Right Foot - Anterior ?Treatment triamcinolone 0.1% cream improved right foot. ? ? ?Assessment & Plan  ?Intrinsic atopic dermatitis ?Right Foot - Anterior ? ?Continue treatment 1-2  times daily as needed. ? ? ? ?I, Theona Muhs, PA-C, have reviewed all documentation's for this visit.  The documentation on 09/27/21 for the exam, diagnosis, procedures and orders are all accurate and complete. ?

## 2021-09-28 NOTE — Telephone Encounter (Signed)
I spoke with the pt and notified of response per MR  ?She verbalized understanding ?Cxr ordered and she will come tomorrow to get this done  ?Nothing further needed ?

## 2021-09-28 NOTE — Telephone Encounter (Signed)
Ok to get cxr 09/29/21 or 09/28/2021 ? ?Not sure what the pressure is coing from. This is one reason for cxr ?

## 2021-09-29 ENCOUNTER — Ambulatory Visit (INDEPENDENT_AMBULATORY_CARE_PROVIDER_SITE_OTHER): Payer: BC Managed Care – PPO

## 2021-09-29 DIAGNOSIS — J45909 Unspecified asthma, uncomplicated: Secondary | ICD-10-CM | POA: Diagnosis not present

## 2021-09-29 DIAGNOSIS — J4540 Moderate persistent asthma, uncomplicated: Secondary | ICD-10-CM | POA: Diagnosis not present

## 2021-09-29 DIAGNOSIS — R053 Chronic cough: Secondary | ICD-10-CM

## 2021-10-03 ENCOUNTER — Ambulatory Visit (INDEPENDENT_AMBULATORY_CARE_PROVIDER_SITE_OTHER): Payer: BC Managed Care – PPO | Admitting: Physical Medicine and Rehabilitation

## 2021-10-03 ENCOUNTER — Encounter: Payer: Self-pay | Admitting: Physical Medicine and Rehabilitation

## 2021-10-03 ENCOUNTER — Other Ambulatory Visit: Payer: Self-pay

## 2021-10-03 VITALS — BP 144/83 | HR 125

## 2021-10-03 DIAGNOSIS — M7918 Myalgia, other site: Secondary | ICD-10-CM

## 2021-10-03 DIAGNOSIS — M79604 Pain in right leg: Secondary | ICD-10-CM

## 2021-10-03 DIAGNOSIS — M79605 Pain in left leg: Secondary | ICD-10-CM

## 2021-10-03 DIAGNOSIS — M5416 Radiculopathy, lumbar region: Secondary | ICD-10-CM

## 2021-10-03 DIAGNOSIS — M461 Sacroiliitis, not elsewhere classified: Secondary | ICD-10-CM | POA: Diagnosis not present

## 2021-10-03 MED ORDER — DIAZEPAM 5 MG PO TABS: ORAL_TABLET | ORAL | 0 refills | Status: DC

## 2021-10-03 NOTE — Progress Notes (Signed)
Pt state lower back pain that travels to both legs.Pt state left knee pain. Pt state she don't know what cause the pain. Pt state she received a inj that helped. Pt state she not taking everything for the patient. ? ? ?Numeric Pain Rating Scale and Functional Assessment ?Average Pain 9 ?Pain Right Now 5 ?My pain is constant, sharp, burning, tingling, and aching ?Pain is worse with: unsure ?Pain improves with: medication and injections ? ? ?In the last MONTH (on 0-10 scale) has pain interfered with the following? ? ?1. General activity like being  able to carry out your everyday physical activities such as walking, climbing stairs, carrying groceries, or moving a chair?  ?Rating(6) ? ?2. Relation with others like being able to carry out your usual social activities and roles such as  activities at home, at work and in your community. ?Rating(7) ? ?3. Enjoyment of life such that you have  been bothered by emotional problems such as feeling anxious, depressed or irritable?  ?Rating(8) ? ? ? ?

## 2021-10-03 NOTE — Progress Notes (Signed)
? ?Danielle Bond - 61 y.o. female MRN [id number]  Date of birth: August 20, 1960 ? ?Office Visit Note: ?Visit Date: 10/03/2021 ?PCP: [name], MD ?Referred by: [name], MD ? ?Subjective: ?Chief Complaint  ?Patient presents with  ? Lower Back - Pain  ? Right Leg - Pain  ? Left Leg - Pain  ? Left Knee - Pain  ? ?HPI: Danielle Bond is a 61 y.o. female who comes in today at the request of Dondra Prader, NP for evaluation of chronic, worsening and severe bilateral lower back pain radiating to buttocks and down legs to feet, most severe pain is right buttock area radiating to anterior thigh. Patient reports pain has been ongoing for several months. Patient states her pain is exacerbated by movement, activity and worsens when sleeping on right side. Patient describes bilateral leg pain as tingling and scraping pain, buttock pain is more of a soreness sensation, currently rates 8 out of 10. Patient states Danielle Bond has tried OTC medications and home exercises, however her pain persists. Patients lumbar MRI from 2022 exhibits mild spondylosis and facet joint arthritis, no herniated disc, spinal canal stenosis or foraminal stenosis noted. Patient states Danielle Bond feels like her entire body is inflamed, reports Danielle Bond feels like her legs are swollen and is retaining fluid. Patient has previously been treated by Dr. Lahoma Rocker with rheumatology at [facility] and reports Danielle Bond is switching to new rheumatologist and has referral placed to Dr. Vernelle Emerald at [facility]. Patient states Danielle Bond does not tolerate use of steroid medications long term and also reports Danielle Bond has many intolerances to medications. Patient states Danielle Bond is frustrated and continues to have severe pain that is limiting her daily activity. Patient denies focal weakness. Patient denies recent trauma or falls.  ? ? ?Review of Systems  ?Musculoskeletal:  Positive for back pain, joint pain and myalgias.  ?Neurological:  Positive for  tingling. Negative for sensory change, focal weakness and weakness.  ?All other systems reviewed and are negative. Otherwise per HPI. ? ?Assessment & Plan: ?Visit Diagnoses:  ?  ICD-10-CM   ?1. Sacroiliitis ([place])  M46.1 Ambulatory referral to Physical Medicine Rehab  ?  ?2. Buttock pain  M79.18 Ambulatory referral to Physical Medicine Rehab  ?  ?3. Pain in right leg  M79.604 Ambulatory referral to Physical Medicine Rehab  ?  ?4. Pain in left leg  M79.605 Ambulatory referral to Physical Medicine Rehab  ?  ?5. Lumbar radiculopathy  M54.16   ?  ?   ?Plan: Findings:  ?Chronic, worsening and severe bilateral lower back pain radiating to buttocks and down legs to feet, severe pain to right buttock area radiating to anterior thigh. Patient continues to have excruciating and debilitating pain despite good conservative therapies such as home exercise regimen and use of medications.  Through the interview, the biggest pain complaint the patient has is right buttock pain referral to the posterior lateral thigh.  This seems to be consistent with a sacroiliac joint mediated pain.  Danielle Bond has a positive Fortin finger sign and Patrick's testing. Her lumbar MRI from 2022 does not directly correlate with all of her symptoms.  Danielle Bond could have some level of radiculitis despite no nerve compression on the MRI.  We also feel there could an underlying central sensitization syndrome such as fibromyalgia contributing to amplify her pain. We feel the next step is to perform a diagnostic and hopefully therapeutic right sacroiliac joint injection under fluoroscopic guidance.  If Danielle Bond gets enough  relief with the sacroiliac joint injection but would encourage continued therapy and/or chiropractic care for sacroiliac joint mediated pain.  If Danielle Bond still had these pains down the legs we could try an epidural at least 1 time but it would be more diagnostic than anything else and I went over this with the patient and her husband.  If her paresthesias in  the legs persisted despite trial epidural injection then I am not sure what else I would have to offer her at that point.  We also talked with patient about medication management, however Danielle Bond has multiple intolerances to medications and is concerned about medications causing exacerbation of ulcerative colitis, Danielle Bond would like to hold on medications at this time.  Consider referral to tertiary care pain management such as [facility]. Patient instructed to follow up with [name] in approximately 2 weeks for re-evaluation. Patient encouraged to remain active and to continue home exercises as tolerated. No red flag symptoms noted upon exam today.   ? ?Meds & Orders:  ?Meds ordered this encounter  ?Medications  ? diazepam (VALIUM) 5 MG tablet  ?  Sig: Take one tablet by mouth with food one hour prior to procedure. May repeat 30 minutes prior if needed.  ?  Dispense:  2 tablet  ?  Refill:  0  ?  Order Specific Question:   Supervising Provider  ?  AnswerMagnus Sinning [[id number]]  ?  ?Orders Placed This Encounter  ?Procedures  ? Ambulatory referral to Physical Medicine Rehab  ?  ?Follow-up: Return for 2 week follow-up for re-evaluation.  ? ?Procedures: ?No procedures performed  ?   ? ?Clinical History: ?MRI lumbar spine:  ? ?INDICATION: Right lower back pain  ? ?TECHNIQUE: Sagittal and axial T1 and T2-weighted sequences were performed. Additional sagittal STIR images were performed.  ? ?COMPARISON: None available  ? ?FINDINGS:  ?#  Vertebral bodies: No compression fracture.  ?#  Alignment: Normal.  ?#  Marrow signal: No significant abnormality.  ?#  Conus medullaris: Normal. Terminates at T12-L1 with no evidence of tethering.  ? ?#  T12-L1 mild bulging of the disc into the small disc protrusion on the left with mild impingement on the thecal sac.  ?#  L1-2: Mild disc desiccation.  ?#  L2-3: Mild bulging of the disc.  ?#  L3-4: Mild bulging of the discs.  ?#  L4-5: Mild bulging of the disc. Mild facet joint  arthritis.  ?#  L5-S1: Mild bulging of the disc. Mild facet joint arthritis.  ? ? ?IMPRESSION:  ?Mild spondylosis and facet joint arthritis.  ?Mild bulging disc at several levels.  ?No herniated disc, spinal stenosis, or foraminal stenosis.  ? ?Electronically Signed by: Ross Marcus on 11/09/2020  ? ?Danielle Bond reports that Danielle Bond has never smoked. Danielle Bond has never used smokeless tobacco.  ?Recent Labs  ?  04/04/21 ?1104  ?HGBA1C 6.2  ? ? ?Objective:  VS:  HT:    WT:   BMI:     BP: ?(!) 144/83  HR: ?(!) 125bpm  TEMP: ( )  RESP:  ?Physical Exam ?Vitals and nursing note reviewed.  ?HENT:  ?   Head: Normocephalic and atraumatic.  ?   Right Ear: External ear normal.  ?   Left Ear: External ear normal.  ?   Nose: Nose normal.  ?   Mouth/Throat:  ?   Mouth: Mucous membranes are moist.  ?Eyes:  ?   Extraocular Movements: Extraocular movements intact.  ?Cardiovascular:  ?  Rate and Rhythm: Normal rate.  ?   Pulses: Normal pulses.  ?Pulmonary:  ?   Effort: Pulmonary effort is normal.  ?Abdominal:  ?   General: Abdomen is flat. There is no distension.  ?Musculoskeletal:     ?   General: Tenderness present.  ?   Cervical back: Normal range of motion.  ?   Comments: Pt rises from seated position to standing without difficulty. Good lumbar range of motion. Strong distal strength without clonus, no pain upon palpation of greater trochanters. Sensation intact bilaterally. Walks independently, gait steady. Positive fortin finger test.   ?Skin: ?   General: Skin is warm and dry.  ?   Capillary Refill: Capillary refill takes less than 2 seconds.  ?Neurological:  ?   General: No focal deficit present.  ?   Mental Status: Danielle Bond is alert and oriented to person, place, and time.  ?Psychiatric:     ?   Mood and Affect: Mood normal.     ?   Behavior: Behavior normal.  ?  ?Ortho Exam ? ?Imaging: ?No results found. ? ?Past Medical/Family/Surgical/Social History: ?Medications & Allergies reviewed per EMR, new medications updated. ?Patient Active  Problem List  ? Diagnosis Date Noted  ? Arthralgia 07/19/2021  ? Immunosuppressed status ([place]) 07/19/2021  ? Eustachian tube dysfunction 06/13/2021  ? Osteoporosis 07/11/2020  ? Atherosclerosis of native coronary artery

## 2021-10-04 ENCOUNTER — Telehealth: Payer: Self-pay | Admitting: Physical Medicine and Rehabilitation

## 2021-10-04 ENCOUNTER — Telehealth: Payer: Self-pay | Admitting: Internal Medicine

## 2021-10-04 NOTE — Telephone Encounter (Signed)
Called patient and she states that she would like the results of her chest xray that was done on 3/10. I told the patient that I would reach out the Dr Chase Caller and get those results for her when he reply's back to me.  ? ?Dr Chase Caller please advise  ?

## 2021-10-04 NOTE — Telephone Encounter (Signed)
Pt called requesting a call back from Dr. Ernestina Patches. Pt states was never discuss that an referral to a Neuro would be sent for her she hasn't had her injection. Please call pt at [phone] ?

## 2021-10-05 NOTE — Telephone Encounter (Signed)
Apologize for the delay with the chest x-ray.  The chest x-ray is clear.  This suggest that the findings on the CT scan are probably atelectasis but good idea to keep an eye overall.  The plan was to come back in 6 months.  If she wants to come back sooner than 3 months that is fine but a repeat CT scan is only required like 6-12 months from the CT scan in February ? ? ? ? ? ?Narrative & Impression  ?CLINICAL DATA:  Chest pressure.  Chronic cough.  Asthma. ?  ?EXAM: ?CHEST - 2 VIEW ?  ?COMPARISON:  06/27/2021 ?  ?FINDINGS: ?Normal sized heart. Clear lungs. Stable mild diffuse peribronchial ?thickening. Mild thoracic spine degenerative changes and mild ?scoliosis. ?  ?IMPRESSION: ?Stable mild chronic bronchitic changes.  No acute abnormality. ?  ?  ?Electronically Signed ?  By: Claudie Revering M.D. ?  On: 09/30/2021 15:08 ?   ? ?

## 2021-10-06 NOTE — Telephone Encounter (Signed)
Called patient and let her know that her chest xray was clear and that Dr Chase Caller does want a repeat CT scan in 6-12 months from her scan that was done in Feb. She verbalized understanding. Nothing further needed  ?

## 2021-10-10 ENCOUNTER — Ambulatory Visit: Payer: BC Managed Care – PPO | Admitting: Family Medicine

## 2021-10-16 DIAGNOSIS — H6123 Impacted cerumen, bilateral: Secondary | ICD-10-CM | POA: Diagnosis not present

## 2021-10-16 DIAGNOSIS — H6981 Other specified disorders of Eustachian tube, right ear: Secondary | ICD-10-CM | POA: Diagnosis not present

## 2021-10-23 ENCOUNTER — Encounter: Payer: Self-pay | Admitting: Physical Medicine and Rehabilitation

## 2021-10-23 ENCOUNTER — Ambulatory Visit (INDEPENDENT_AMBULATORY_CARE_PROVIDER_SITE_OTHER): Payer: BC Managed Care – PPO | Admitting: Physical Medicine and Rehabilitation

## 2021-10-23 ENCOUNTER — Ambulatory Visit: Payer: Self-pay

## 2021-10-23 VITALS — BP 124/89 | HR 101

## 2021-10-23 DIAGNOSIS — M461 Sacroiliitis, not elsewhere classified: Secondary | ICD-10-CM | POA: Diagnosis not present

## 2021-10-23 MED ORDER — BUPIVACAINE HCL 0.5 % IJ SOLN
2.0000 mL | INTRAMUSCULAR | Status: AC | PRN
  Administered 2021-10-23: 2 mL via INTRA_ARTICULAR

## 2021-10-23 MED ORDER — METHYLPREDNISOLONE ACETATE 80 MG/ML IJ SUSP
80.0000 mg | INTRAMUSCULAR | Status: AC | PRN
  Administered 2021-10-23: 80 mg via INTRA_ARTICULAR

## 2021-10-23 NOTE — Progress Notes (Signed)
? ?Danielle Bond - 61 y.o. female MRN [id number]  Date of birth: 08/28/1960 ? ?Office Visit Note: ?Visit Date: 10/23/2021 ?PCP: [name], MD ?Referred by: [name], MD ? ?Subjective: ?Chief Complaint  ?Patient presents with  ? Lower Back - Pain  ? Right Leg - Pain  ? Left Leg - Pain  ? ?HPI:  Danielle Bond is a 61 y.o. female who comes in today at the request of Barnet Pall, FNP for planned Right anesthetic sacroiliac joint arthrogram with fluoroscopic guidance.  The patient has failed conservative care including home exercise, medications, time and activity modification.  This injection will be diagnostic and hopefully therapeutic.  Please see requesting physician notes for further details and justification. ? ?ROS Otherwise per HPI. ? ?Assessment & Plan: ?Visit Diagnoses:  ?  ICD-10-CM   ?1. Sacroiliitis (H[facility])  M46.1 XR C-ARM NO REPORT  ?  ?  ?Plan: No additional findings.  ? ?Meds & Orders: No orders of the defined types were placed in this encounter. ?  ?Orders Placed This Encounter  ?Procedures  ? Sacroiliac Joint Inj  ? XR C-ARM NO REPORT  ?  ?Follow-up: Return for as scheduled.  ? ?Procedures: ?Sacroiliac Joint Inj on 10/23/2021 8:45 AM ?Indications: pain and diagnostic evaluation ?Details: 22 G 3.5 in needle, fluoroscopy-guided posterior approach ?Medications: 2 mL bupivacaine 0.5 %; 80 mg methylPREDNISolone acetate 80 MG/ML ?Outcome: tolerated well, no immediate complications ? ?Sacroiliac Joint Intra-Articular Injection - Posterior Approach with Fluoroscopic Guidance  ? ?Position: PRONE ? ?Additional Comments: ?Vital signs were monitored before and after the procedure. ?Patient was prepped and draped in the usual sterile fashion. ?The correct patient, procedure, and site was verified. ? ? ?Injection Procedure Details:  ? ?Location/Site:  ?Sacroiliac joint ? ?Needle size: 3.5 in Spinal Needle ? ?Needle type: Spinal ? ?Needle Placement: Intra-articular ? ?Findings: ? -Comments: There was  excellent flow of contrast producing a partial arthrogram of the sacroiliac joint.  ? ?Procedure Details: ?Starting with a 90 degree vertical and midline orientation the fluoroscope was tilted cranially 20 to 25 degrees and the target area of the inferior most part of the SI joint on the side mentioned above was visualized.  The soft tissues overlying this target were infiltrated with 4 ml. of 1% Lidocaine without Epinephrine. A #22 gauge spinal needle was inserted perpendicular to the fluoroscope table and advanced into the posterior inferior joint space using fluoroscopic guidance. ? ?Position in the joint space was confirmed by obtaining a partial arthrogram using a 2 ml. volume of Isovue-250 contrast agent. After negative aspirate for gross pus or blood, the injectate was delivered to the joint. Radiographs were obtained for documentation purposes. ? ? ?Additional Comments:  ? ?Dressing: Bandaid ?  ? ?Post-procedure details: ?Patient was observed during the procedure. ?Post-procedure instructions were reviewed. ? ?Patient left the clinic in stable condition. ? ? ? There was excellent flow of contrast producing a partial arthrogram of the sacroiliac joint.  ?Procedure, treatment alternatives, risks and benefits explained, specific risks discussed. Consent was given by the patient. Immediately prior to procedure a time out was called to verify the correct patient, procedure, equipment, support staff and site/side marked as required. Patient was prepped and draped in the usual sterile fashion.  ? ?  ?   ? ?Clinical History: ?MRI lumbar spine:  ? ?INDICATION: Right lower back pain  ? ?TECHNIQUE: Sagittal and axial T1 and T2-weighted sequences were performed. Additional sagittal STIR images were performed.  ? ?  COMPARISON: None available  ? ?FINDINGS:  ?#  Vertebral bodies: No compression fracture.  ?#  Alignment: Normal.  ?#  Marrow signal: No significant abnormality.  ?#  Conus medullaris: Normal. Terminates at  T12-L1 with no evidence of tethering.  ? ?#  T12-L1 mild bulging of the disc into the small disc protrusion on the left with mild impingement on the thecal sac.  ?#  L1-2: Mild disc desiccation.  ?#  L2-3: Mild bulging of the disc.  ?#  L3-4: Mild bulging of the discs.  ?#  L4-5: Mild bulging of the disc. Mild facet joint arthritis.  ?#  L5-S1: Mild bulging of the disc. Mild facet joint arthritis.  ? ? ?IMPRESSION:  ?Mild spondylosis and facet joint arthritis.  ?Mild bulging disc at several levels.  ?No herniated disc, spinal stenosis, or foraminal stenosis.  ? ?Electronically Signed by: Ross Marcus on 11/09/2020  ? ? ? ?Objective:  VS:  HT:    WT:   BMI:     BP:124/89  HR:(!) 101bpm  TEMP: ( )  RESP:  ?Physical Exam  ? ?Imaging: ?XR C-ARM NO REPORT ? ?Result Date: 10/23/2021 ?Please see Notes tab for imaging impression.  ?

## 2021-10-23 NOTE — Progress Notes (Signed)
Pt state lower back pain that travels to both legs.Pt state left knee pain. Pt state she don't know what cause the pain. Pt state she received a inj that helped. Pt state she not taking everything for the pain. ? ?Numeric Pain Rating Scale and Functional Assessment ?Average Pain 4 ? ? ?In the last MONTH (on 0-10 scale) has pain interfered with the following? ? ?1. General activity like being  able to carry out your everyday physical activities such as walking, climbing stairs, carrying groceries, or moving a chair?  ?Rating(8) ? ? ?+Driver, -BT, -Dye Allergies. ? ?

## 2021-11-06 ENCOUNTER — Encounter: Payer: Self-pay | Admitting: Physical Medicine and Rehabilitation

## 2021-11-06 ENCOUNTER — Ambulatory Visit (INDEPENDENT_AMBULATORY_CARE_PROVIDER_SITE_OTHER): Payer: BC Managed Care – PPO | Admitting: Physical Medicine and Rehabilitation

## 2021-11-06 DIAGNOSIS — M1990 Unspecified osteoarthritis, unspecified site: Secondary | ICD-10-CM

## 2021-11-06 DIAGNOSIS — M2550 Pain in unspecified joint: Secondary | ICD-10-CM

## 2021-11-06 DIAGNOSIS — M461 Sacroiliitis, not elsewhere classified: Secondary | ICD-10-CM | POA: Diagnosis not present

## 2021-11-06 DIAGNOSIS — M7918 Myalgia, other site: Secondary | ICD-10-CM | POA: Diagnosis not present

## 2021-11-06 NOTE — Progress Notes (Signed)
? ?Danielle Bond - 61 y.o. female MRN [id number]  Date of birth: 04/27/1961 ? ?Office Visit Note: ?Visit Date: 11/06/2021 ?PCP: [name], MD ?Referred by: [name], MD ? ?Subjective: ?No chief complaint on file. ? ?HPI: Danielle Bond is a 61 y.o. female who comes in today for evaluation of chronic right buttock pain radiating to anterior thigh. Patent reports pain has been ongoing for several months and is exacerbated by movement and activity. She describes her pain as constant sore and aching sensation, currently rates as 0 out of 10. Patient reports some relief of pain with home exercise regimen and OTC medications. Patients lumbar MRI from 2022 exhibits mild spondylosis and facet joint arthritis, no herniated disc, spinal canal stenosis or foraminal stenosis noted. Patient had right sacroiliac joint injection performed in our office on 10/23/2021 and reports complete resolution of symptoms. Patient states she feels great and has resumed her normal daily activities, patient states she has plans to start [place] soon. Patient reports inflammation in her body also feels better, however she is inquiring about referral to Dr. Vernelle Emerald with [facility]. Patient states she is currently looking for new rheumatologist and would like a referral if possible.  Patient was previously treated by Dr. Lahoma Rocker at [facility]. Patient denies focal weakness, numbness and tingling.  Patient denies recent trauma or falls. ? ?Review of Systems  ?Musculoskeletal:  Positive for joint pain.  ?Neurological:  Negative for tingling, sensory change, focal weakness and weakness.  ?All other systems reviewed and are negative. Otherwise per HPI. ? ?Assessment & Plan: ?Visit Diagnoses:  ?  ICD-10-CM   ?1. Sacroiliitis (HCC)  M46.1   ?  ?2. Buttock pain  M79.18   ?  ?3. Inflammatory arthritis  M19.90 Ambulatory referral to Rheumatology  ?  ?4. Polyarthralgia  M25.50 Ambulatory referral  to Rheumatology  ?  ?   ?Plan: Findings:  ?Chronic right buttock pain radiating to anterior thigh.  Patient continues to have significant and sustained pain relief from recent right sacroiliac joint injection performed in our office.  Patient encouraged to continue with conservative therapies such as home exercise regimen, rest and use of over-the-counter medications as needed to help manage pain.  Patient's clinical presentation and exam remain consistent with sacroiliitis.  We believe the next step is to continue to monitor patient, she is instructed to let us know if her pain worsens or changes in nature.  We would consider repeating right sacroiliac joint injection intermittently as needed.  I I did speak with patient about referral to rheumatology, I am happy to place a referral to Dr. Vernelle Emerald at [facility].  Patient did have a previous referral placed with Dr. Benjamine Mola however she decided to hold on scheduling appointment until after her injection.  Patient encouraged to remain active and to continue home exercise regimen as tolerated.  No red flag symptoms noted upon exam today.  ? ?Meds & Orders: No orders of the defined types were placed in this encounter. ?  ?Orders Placed This Encounter  ?Procedures  ? Ambulatory referral to Rheumatology  ?  ?Follow-up: Return if symptoms worsen or fail to improve.  ? ?Procedures: ?No procedures performed  ?   ? ?Clinical History: ?MRI lumbar spine:  ? ?INDICATION: Right lower back pain  ? ?TECHNIQUE: Sagittal and axial T1 and T2-weighted sequences were performed. Additional sagittal STIR images were performed.  ? ?COMPARISON: None available  ? ?FINDINGS:  ?#  Vertebral bodies: No compression fracture.  ?#  Alignment: Normal.  ?#  Marrow signal: No significant abnormality.  ?#  Conus medullaris: Normal. Terminates at T12-L1 with no evidence of tethering.  ? ?#  T12-L1 mild bulging of the disc into the small disc protrusion on the left with mild  impingement on the thecal sac.  ?#  L1-2: Mild disc desiccation.  ?#  L2-3: Mild bulging of the disc.  ?#  L3-4: Mild bulging of the discs.  ?#  L4-5: Mild bulging of the disc. Mild facet joint arthritis.  ?#  L5-S1: Mild bulging of the disc. Mild facet joint arthritis.  ? ? ?IMPRESSION:  ?Mild spondylosis and facet joint arthritis.  ?Mild bulging disc at several levels.  ?No herniated disc, spinal stenosis, or foraminal stenosis.  ? ?Electronically Signed by: Ross Marcus on 11/09/2020  ? ?She reports that she has never smoked. She has never used smokeless tobacco.  ?Recent Labs  ?  04/04/21 ?1104  ?HGBA1C 6.2  ? ? ?Objective:  VS:  HT:    WT:   BMI:     BP:   HR: bpm  TEMP: ( )  RESP:  ?Physical Exam ?Vitals and nursing note reviewed.  ?HENT:  ?   Head: Normocephalic and atraumatic.  ?   Right Ear: External ear normal.  ?   Left Ear: External ear normal.  ?   Nose: Nose normal.  ?   Mouth/Throat:  ?   Mouth: Mucous membranes are moist.  ?Eyes:  ?   Extraocular Movements: Extraocular movements intact.  ?Cardiovascular:  ?   Rate and Rhythm: Normal rate.  ?   Pulses: Normal pulses.  ?Pulmonary:  ?   Effort: Pulmonary effort is normal.  ?Abdominal:  ?   General: Abdomen is flat. There is no distension.  ?Musculoskeletal:     ?   General: Tenderness present.  ?   Cervical back: Normal range of motion.  ?   Comments: Pt rises from seated position to standing without difficulty. Good lumbar range of motion. Strong distal strength without clonus, no pain upon palpation of greater trochanters. Sensation intact bilaterally. Walks independently, gait steady. Negative fortin finger test.  ?Skin: ?   General: Skin is warm and dry.  ?   Capillary Refill: Capillary refill takes less than 2 seconds.  ?Neurological:  ?   General: No focal deficit present.  ?   Mental Status: She is alert and oriented to person, place, and time.  ?Psychiatric:     ?   Mood and Affect: Mood normal.     ?   Behavior: Behavior normal.  ?   ?Ortho Exam ? ?Imaging: ?No results found. ? ?Past Medical/Family/Surgical/Social History: ?Medications & Allergies reviewed per EMR, new medications updated. ?Patient Active Problem List  ? Diagnosis Date Noted  ? Arthralgia 07/19/2021  ? Immunosuppressed status ([place]) 07/19/2021  ? Eustachian tube dysfunction 06/13/2021  ? Osteoporosis 07/11/2020  ? Atherosclerosis of native coronary artery of native heart without angina pectoris 06/08/2019  ? Aortic atherosclerosis ([place]) 06/08/2019  ? Mixed hyperlipidemia 05/02/2019  ? Postural dizziness with presyncope 05/02/2019  ? HSV-2 (herpes simplex virus 2) infection 11/04/2018  ? Hypothyroidism s/p total thyroidectomy 1997 08/15/2018  ? Autoimmune hepatitis ([place]) 08/16/2012  ? Asthma 03/31/2012  ? Ulcerative colitis ([place]) 03/31/2012  ? History of renal calculi 03/31/2012  ? Migraines 03/31/2012  ? Mitral valve prolapse   ? ?Past Medical History:  ?Diagnosis Date  ? Asthma   ?  Chronic kidney disease   ? Hx of pancreatitis 06/16/2018  ? Kidney stone   ? Kidney stone   ? Migraine   ? Orthostatic hypotension   ? Osteopenia 03/31/2012  ? Recurrent upper respiratory infection (URI)   ? Ulcerative colitis ([place]) 03/31/2012  ? Urticaria   ? ?Family History  ?Problem Relation Age of Onset  ? Cervical cancer Paternal Aunt   ? Heart disease Paternal Grandmother   ? Heart disease Mother   ? Dementia Mother   ? Thyroid disease Mother   ? Osteoarthritis Mother   ? Asthma Mother   ? Heart attack Father   ? Breast cancer Cousin   ? Heart attack Brother   ? Heart disease Brother   ? ?Past Surgical History:  ?Procedure Laterality Date  ? CESAREAN SECTION    ? x3  ? CHOLECYSTECTOMY    ? COLONOSCOPY    ? LITHOTRIPSY    ? THYROIDECTOMY    ? 1997  ? ?Social History  ? ?Occupational History  ? Occupation: Self-employed  ?Tobacco Use  ? Smoking status: Never  ? Smokeless tobacco: Never  ?Vaping Use  ? Vaping Use: Never used  ?Substance and Sexual Activity  ? Alcohol use: No  ? Drug use: No  ? Sexual  activity: Yes  ? ? ?

## 2021-11-06 NOTE — Progress Notes (Signed)
Pt has hx of inj on 10/23/21 pt state it helped. Pt state a few days after the inj she had a bad rash. Pt state she uses a cream to help and it did. ? ?

## 2021-11-09 ENCOUNTER — Ambulatory Visit: Payer: BC Managed Care – PPO

## 2021-11-09 DIAGNOSIS — R0609 Other forms of dyspnea: Secondary | ICD-10-CM

## 2021-11-09 DIAGNOSIS — R002 Palpitations: Secondary | ICD-10-CM

## 2021-11-13 NOTE — Progress Notes (Signed)
Patient called back, and is requesting you call her back, to give her a thorough explanation of mild valve disease, because she has a brother who recently had a HA and was told that everything was normal and now she is worried. She also stated that she was at the gym today and got very winded and HR was 127 while sitting. Please give her a call back sometime today, per patient request, thank you.

## 2021-11-23 ENCOUNTER — Other Ambulatory Visit: Payer: Self-pay | Admitting: Student

## 2021-11-27 ENCOUNTER — Other Ambulatory Visit: Payer: BC Managed Care – PPO

## 2021-11-30 ENCOUNTER — Encounter: Payer: Self-pay | Admitting: Family Medicine

## 2021-11-30 ENCOUNTER — Ambulatory Visit (INDEPENDENT_AMBULATORY_CARE_PROVIDER_SITE_OTHER): Payer: BC Managed Care – PPO | Admitting: Family Medicine

## 2021-11-30 VITALS — BP 136/84 | HR 84 | Temp 98.3°F | Ht 60.0 in | Wt 136.4 lb

## 2021-11-30 DIAGNOSIS — M2550 Pain in unspecified joint: Secondary | ICD-10-CM | POA: Diagnosis not present

## 2021-11-30 DIAGNOSIS — I341 Nonrheumatic mitral (valve) prolapse: Secondary | ICD-10-CM | POA: Diagnosis not present

## 2021-11-30 MED ORDER — AZITHROMYCIN 250 MG PO TABS: ORAL_TABLET | ORAL | 0 refills | Status: DC

## 2021-11-30 NOTE — Assessment & Plan Note (Signed)
Stable on recent echocardiogram.  Continue management per cardiology. ?

## 2021-11-30 NOTE — Progress Notes (Signed)
? ?  Danielle Bond is a 61 y.o. female who presents today for an office visit. ? ?Assessment/Plan:  ?New/Acute Problems: ?Sinusitis ?Given length of symptoms we will start azithromycin.  This is worked well for her.  She can continue over-the-counter meds.  She will let me know if not improving.  Discussed reasons to return to care.  Follow-up as needed. ? ?Chronic Problems Addressed Today: ?Arthralgia ?Recently diagnosed with sacroiliitis by orthopedics.  This is doing much better after injection.  She will be following up with rheumatology in a couple months. ? ?Mitral valve prolapse ?Stable on recent echocardiogram.  Continue management per cardiology. ? ? ?  ?Subjective:  ?HPI: ? ?PAtient here with concern for sinus infection. Started about a week ago. Tried OTC medications without much improvement. Feels like previous sinus infections. Has a lot of discharge and pressure.  ? ?   ?  ?Objective:  ?Physical Exam: ?BP 136/84 (BP Location: Left Arm)   Pulse 84   Temp 98.3 ?F (36.8 ?C) (Temporal)   Ht 5' (1.524 m)   Wt 136 lb 6.4 oz (61.9 kg)   LMP 03/03/2013   SpO2 95%   BMI 26.64 kg/m?   ?Gen: No acute distress, resting comfortably ?HEENT: TMs with clear effusion.  OP clear.  Nasal mucosa erythematous and boggy with discharge. ?CV: Regular rate and rhythm with no murmurs appreciated ?Pulm: Normal work of breathing, clear to auscultation bilaterally with no crackles, wheezes, or rhonchi ?Neuro: Grossly normal, moves all extremities ?Psych: Normal affect and thought content ? ?   ? ?[name]. [name], MD ?11/30/2021 3:17 PM  ?

## 2021-11-30 NOTE — Assessment & Plan Note (Signed)
Recently diagnosed with sacroiliitis by orthopedics.  This is doing much better after injection.  She will be following up with rheumatology in a couple months. ?

## 2021-12-13 ENCOUNTER — Encounter: Payer: Self-pay | Admitting: Family Medicine

## 2021-12-29 DIAGNOSIS — E559 Vitamin D deficiency, unspecified: Secondary | ICD-10-CM | POA: Diagnosis not present

## 2021-12-29 DIAGNOSIS — E890 Postprocedural hypothyroidism: Secondary | ICD-10-CM | POA: Diagnosis not present

## 2021-12-29 DIAGNOSIS — E119 Type 2 diabetes mellitus without complications: Secondary | ICD-10-CM | POA: Diagnosis not present

## 2021-12-29 DIAGNOSIS — R5383 Other fatigue: Secondary | ICD-10-CM | POA: Diagnosis not present

## 2022-01-01 ENCOUNTER — Telehealth: Payer: Self-pay

## 2022-01-01 NOTE — Telephone Encounter (Signed)
Pt would also like to know if she can taking 650 mg of fish oil.

## 2022-01-01 NOTE — Telephone Encounter (Signed)
Pt stated that she is taking whole tablet of her metoprolol once daily. She would like to know if we can update her prescription and send it into her pharmacy. Is she suppose to be taking it this way? Please advise.

## 2022-01-01 NOTE — Telephone Encounter (Signed)
Yes that is fine

## 2022-01-02 ENCOUNTER — Other Ambulatory Visit: Payer: Self-pay

## 2022-01-02 MED ORDER — METOPROLOL TARTRATE 25 MG PO TABS: 25.0000 mg | ORAL_TABLET | Freq: Every day | ORAL | 1 refills | Status: DC

## 2022-01-02 NOTE — Telephone Encounter (Signed)
Called and spoke to pt, pt voiced understanding. Metoprolol refill has been sent.

## 2022-01-10 DIAGNOSIS — R197 Diarrhea, unspecified: Secondary | ICD-10-CM | POA: Diagnosis not present

## 2022-01-10 DIAGNOSIS — K5100 Ulcerative (chronic) pancolitis without complications: Secondary | ICD-10-CM | POA: Diagnosis not present

## 2022-01-10 DIAGNOSIS — R140 Abdominal distension (gaseous): Secondary | ICD-10-CM | POA: Diagnosis not present

## 2022-01-10 DIAGNOSIS — R12 Heartburn: Secondary | ICD-10-CM | POA: Diagnosis not present

## 2022-01-10 DIAGNOSIS — K625 Hemorrhage of anus and rectum: Secondary | ICD-10-CM | POA: Diagnosis not present

## 2022-01-11 DIAGNOSIS — K5100 Ulcerative (chronic) pancolitis without complications: Secondary | ICD-10-CM | POA: Diagnosis not present

## 2022-01-11 DIAGNOSIS — K625 Hemorrhage of anus and rectum: Secondary | ICD-10-CM | POA: Diagnosis not present

## 2022-01-11 DIAGNOSIS — R140 Abdominal distension (gaseous): Secondary | ICD-10-CM | POA: Diagnosis not present

## 2022-01-11 DIAGNOSIS — R197 Diarrhea, unspecified: Secondary | ICD-10-CM | POA: Diagnosis not present

## 2022-01-12 DIAGNOSIS — K625 Hemorrhage of anus and rectum: Secondary | ICD-10-CM | POA: Diagnosis not present

## 2022-01-12 DIAGNOSIS — K5100 Ulcerative (chronic) pancolitis without complications: Secondary | ICD-10-CM | POA: Diagnosis not present

## 2022-01-12 DIAGNOSIS — R197 Diarrhea, unspecified: Secondary | ICD-10-CM | POA: Diagnosis not present

## 2022-01-12 DIAGNOSIS — R140 Abdominal distension (gaseous): Secondary | ICD-10-CM | POA: Diagnosis not present

## 2022-01-16 ENCOUNTER — Ambulatory Visit: Payer: BC Managed Care – PPO | Admitting: Family Medicine

## 2022-01-16 ENCOUNTER — Ambulatory Visit: Payer: BC Managed Care – PPO | Admitting: Physician Assistant

## 2022-01-16 ENCOUNTER — Ambulatory Visit (INDEPENDENT_AMBULATORY_CARE_PROVIDER_SITE_OTHER): Payer: BC Managed Care – PPO | Admitting: Physician Assistant

## 2022-01-16 ENCOUNTER — Encounter: Payer: Self-pay | Admitting: Physician Assistant

## 2022-01-16 VITALS — BP 108/70 | HR 102 | Temp 97.7°F | Ht 60.0 in | Wt 135.0 lb

## 2022-01-16 DIAGNOSIS — K51011 Ulcerative (chronic) pancolitis with rectal bleeding: Secondary | ICD-10-CM

## 2022-01-16 DIAGNOSIS — R200 Anesthesia of skin: Secondary | ICD-10-CM | POA: Diagnosis not present

## 2022-01-16 NOTE — Progress Notes (Signed)
Danielle Bond is a 61 y.o. female here for a follow up of facial numbness.  History of Present Illness:   Chief Complaint  Patient presents with   Facial numbness    Pt c/o numbness right side of face x 1.5 months, sometimes dizzy or lightheaded, denies blurred vision.    HPI  Facial Numbness  Patient complain of intermittent facial numbness on the right side of her face that has been onset for 1-2 months. States she has been experiencing increased numbness in her right side of face. Reports she saw her PCP on 11/30/2021 and was told this could be due to sinusitis vs hemiplegic migraine since she does have hx of this. States she does get headaches but she mostly have pressure in her eyes and head when this starts. Never had anything like this before. She does admit to feeling "lightheadedness" when changing certain movements and states she feels unsteady on her feet upon getting up. Denies pain upon eye movement, double vision, chest pain, SOB, worst HA of life, LE swelling. Denies any confusion, slurred speech, difficulty walking or changes in gait.  Saw neuro prior to moving to [place]. MRI of brain last done in 2014 --- records reviewed in epic.  UC She also endorses current UC flare. Hasn't had a flare is several years. She is having diarrhea multiple times per day. Planning to see her GI doc soon. Passed some blood clots with her BM this AM.  Past Medical History:  Diagnosis Date   Asthma    Chronic kidney disease    Hx of pancreatitis 06/16/2018   Kidney stone    Kidney stone    Migraine    Orthostatic hypotension    Osteopenia 03/31/2012   Recurrent upper respiratory infection (URI)    Ulcerative colitis (HCC) 03/31/2012   Urticaria      Social History   Tobacco Use   Smoking status: Never   Smokeless tobacco: Never  Vaping Use   Vaping Use: Never used  Substance Use Topics   Alcohol use: No   Drug use: No    Past Surgical History:  Procedure Laterality Date   CESAREAN  SECTION     x3   CHOLECYSTECTOMY     COLONOSCOPY     LITHOTRIPSY     THYROIDECTOMY     1997    Family History  Problem Relation Age of Onset   Cervical cancer Paternal Aunt    Heart disease Paternal Grandmother    Heart disease Mother    Dementia Mother    Thyroid disease Mother    Osteoarthritis Mother    Asthma Mother    Heart attack Father    Breast cancer Cousin    Heart attack Brother    Heart disease Brother     Allergies  Allergen Reactions   Almond Oil Hives and Shortness Of Breath   Lialda [Mesalamine] Other (See Comments)    Vomiting and rectal bleeding   Other Anaphylaxis    Almonds, tree nuts   Aspirin Hives and Nausea Only    Stomach cramps   Hydromorphone Hcl Other (See Comments)    Caused Enzyme levels to go up   Penicillins Hives and Swelling    Has patient had a PCN reaction causing immediate rash, facial/tongue/throat swelling, SOB or lightheadedness with hypotension: Yes Has patient had a PCN reaction causing severe rash involving mucus membranes or skin necrosis: No Has patient had a PCN reaction that required hospitalization: No Has patient had  a PCN reaction occurring within the last 10 years: No  Stomach cramps If all of the above answers are "NO", then may proceed with Cephalosporin use.    Acetaminophen Other (See Comments)    Has autoimmune hepatitis, wants to avoid APAP   Apriso [Mesalamine Er] Nausea And Vomiting   Corticosteroids Other (See Comments)    Had acute pancreatitis x 2 after steroid use.   Imitrex [Sumatriptan] Swelling    Injection caused swelling   Stadol [Butorphanol] Other (See Comments)    hallucinates   Topamax [Topiramate] Other (See Comments)    Kidney stones   Vancomycin Hives   Azathioprine Rash   Pepcid [Famotidine] Nausea And Vomiting    Makes acid worse   Prednisone Nausea And Vomiting    All steroids causes nausea and acute pancreatitis     Current Medications:   Current Outpatient Medications:     albuterol (PROVENTIL) (2.5 MG/3ML) 0.083% nebulizer solution, Take 3 mLs (2.5 mg total) by nebulization every 6 (six) hours as needed for wheezing or shortness of breath., Disp: 150 mL, Rfl: 1   albuterol (VENTOLIN HFA) 108 (90 Base) MCG/ACT inhaler, TAKE 2 PUFFS BY MOUTH EVERY 6 HOURS AS NEEDED FOR WHEEZE OR SHORTNESS OF BREATH, Disp: 25.5 each, Rfl: 1   budesonide-formoterol (SYMBICORT) 160-4.5 MCG/ACT inhaler, Inhale 2 puffs into the lungs in the morning and at bedtime., Disp: 1 each, Rfl: 12   cholecalciferol (VITAMIN D3) 25 MCG (1000 UNIT) tablet, Take 50,000 Units by mouth., Disp: , Rfl:    EPINEPHRINE 0.3 mg/0.3 mL IJ SOAJ injection, INJECT 0.3 MLS (0.3 MG TOTAL) INTO THE MUSCLE AS NEEDED FOR ANAPHYLAXIS., Disp: 2 each, Rfl: 1   fluticasone (FLONASE) 50 MCG/ACT nasal spray, PLACE 2 SPRAYS INTO BOTH NOSTRILS DAILY AS NEEDED FOR ALLERGIES OR RHINITIS., Disp: 48 mL, Rfl: 1   folic acid (FOLVITE) 1 MG tablet, TAKE 1 TABLET BY MOUTH EVERY DAY FOR 30 DAYS, Disp: 90 tablet, Rfl: 1   HUMIRA PEN 40 MG/0.4ML PNKT, every 14 (fourteen) days. , Disp: , Rfl:    Magnesium 500 MG TABS, Take 500 mg by mouth daily., Disp: , Rfl:    metoprolol tartrate (LOPRESSOR) 25 MG tablet, Take 1 tablet (25 mg total) by mouth daily., Disp: 180 tablet, Rfl: 1   Multiple Vitamin (MULTIVITAMIN WITH MINERALS) TABS tablet, Take 1 tablet by mouth daily., Disp: , Rfl:    pantoprazole (PROTONIX) 40 MG tablet, Take 40 mg by mouth daily., Disp: , Rfl:    Riboflavin 100 MG CAPS, Take 1 capsule by mouth every other day., Disp: , Rfl:    rosuvastatin (CRESTOR) 10 MG tablet, TAKE 1 TABLET BY MOUTH EVERY DAY, Disp: 90 tablet, Rfl: 3   SYNTHROID 100 MCG tablet, Take 100 mcg by mouth every morning., Disp: , Rfl:    triamcinolone cream (KENALOG) 0.1 %, Apply 1 application topically daily as needed., Disp: 453 g, Rfl: 6   valACYclovir (VALTREX) 500 MG tablet, Take 1 tablet by mouth as needed., Disp: , Rfl:    vitamin C (ASCORBIC ACID) 500 MG  tablet, Take 500 mg by mouth daily., Disp: , Rfl:    Review of Systems:   ROS Negative unless otherwise specified per HPI.   Vitals:   Vitals:   01/16/22 1518  BP: 108/70  Pulse: (!) 102  Temp: 97.7 F (36.5 C)  TempSrc: Temporal  SpO2: 96%  Weight: 135 lb (61.2 kg)  Height: 5' (1.524 m)     Body mass index is  26.37 kg/m.  Orthostatic VS for the past 24 hrs (Last 3 readings):  BP- Lying Pulse- Lying BP- Sitting Pulse- Sitting BP- Standing at 0 minutes Pulse- Standing at 0 minutes  01/16/22 1540 120/70 90 110/72 89 110/70 92     Physical Exam:   Physical Exam Vitals and nursing note reviewed.  Constitutional:      General: She is not in acute distress.    Appearance: She is well-developed. She is not ill-appearing or toxic-appearing.  HENT:     Head: Normocephalic and atraumatic.     Right Ear: Tympanic membrane, ear canal and external ear normal. Tympanic membrane is not erythematous, retracted or bulging.     Left Ear: Tympanic membrane, ear canal and external ear normal. Tympanic membrane is not erythematous, retracted or bulging.     Nose: Nose normal.     Right Sinus: No maxillary sinus tenderness or frontal sinus tenderness.     Left Sinus: No maxillary sinus tenderness or frontal sinus tenderness.     Mouth/Throat:     Pharynx: Uvula midline. No posterior oropharyngeal erythema.  Eyes:     General: Lids are normal.     Conjunctiva/sclera: Conjunctivae normal.  Neck:     Trachea: Trachea normal.  Cardiovascular:     Rate and Rhythm: Normal rate and regular rhythm.     Heart sounds: Normal heart sounds, S1 normal and S2 normal.  Pulmonary:     Effort: Pulmonary effort is normal.     Breath sounds: Normal breath sounds. No decreased breath sounds, wheezing, rhonchi or rales.  Lymphadenopathy:     Cervical: No cervical adenopathy.  Skin:    General: Skin is warm and dry.  Neurological:     General: No focal deficit present.     Mental Status: She is  alert.     Sensory: Sensation is intact.     Motor: Motor function is intact.     Coordination: Coordination is intact.     Comments: Decreased sensation per patient to R cheek and lower jaw  Psychiatric:        Speech: Speech normal.        Behavior: Behavior normal. Behavior is cooperative.     Assessment and Plan:   Right facial numbness Ongoing Possible sequelae of her hemiplegic migraines however due to persistence and atypical symptoms for her, will order MRI for further evaluation No obvious stroke signs on exam Update blood work She was advised to go to the ER if new/worsening symptoms Consider neuro referral based on results and clinical sx  UC No red flags suggesting severe dehydration or blood loss Currently in a flare She states she has an upcoming appointment with GI She would like some blood work today to check lytes Continue to push fluids as able  [employer] as a Neurosurgeon for [employer], PA.,have documented all relevant documentation on the behalf of Jarold Motto, PA,as directed by  Jarold Motto, PA while in the presence of Jarold Motto, [place].   I, Jarold Motto, [place], have reviewed all documentation for this visit. The documentation on 01/16/22 for the exam, diagnosis, procedures, and orders are all accurate and complete.   Jarold Motto, PA-C

## 2022-01-17 ENCOUNTER — Encounter (HOSPITAL_BASED_OUTPATIENT_CLINIC_OR_DEPARTMENT_OTHER): Payer: Self-pay

## 2022-01-17 ENCOUNTER — Other Ambulatory Visit: Payer: Self-pay

## 2022-01-17 ENCOUNTER — Emergency Department (HOSPITAL_BASED_OUTPATIENT_CLINIC_OR_DEPARTMENT_OTHER)
Admission: EM | Admit: 2022-01-17 | Discharge: 2022-01-17 | Disposition: A | Discharge status: Home / Self Care | Payer: BC Managed Care – PPO | Attending: Emergency Medicine | Admitting: Emergency Medicine

## 2022-01-17 DIAGNOSIS — N189 Chronic kidney disease, unspecified: Secondary | ICD-10-CM | POA: Insufficient documentation

## 2022-01-17 DIAGNOSIS — Z79899 Other long term (current) drug therapy: Secondary | ICD-10-CM | POA: Diagnosis not present

## 2022-01-17 DIAGNOSIS — R197 Diarrhea, unspecified: Secondary | ICD-10-CM | POA: Diagnosis not present

## 2022-01-17 DIAGNOSIS — Z7951 Long term (current) use of inhaled steroids: Secondary | ICD-10-CM | POA: Diagnosis not present

## 2022-01-17 DIAGNOSIS — K51911 Ulcerative colitis, unspecified with rectal bleeding: Secondary | ICD-10-CM | POA: Insufficient documentation

## 2022-01-17 DIAGNOSIS — J45909 Unspecified asthma, uncomplicated: Secondary | ICD-10-CM | POA: Insufficient documentation

## 2022-01-17 DIAGNOSIS — E860 Dehydration: Secondary | ICD-10-CM

## 2022-01-17 DIAGNOSIS — K625 Hemorrhage of anus and rectum: Secondary | ICD-10-CM | POA: Diagnosis not present

## 2022-01-17 DIAGNOSIS — K529 Noninfective gastroenteritis and colitis, unspecified: Secondary | ICD-10-CM | POA: Diagnosis not present

## 2022-01-17 LAB — URINALYSIS, ROUTINE W REFLEX MICROSCOPIC
Bilirubin Urine: NEGATIVE
Glucose, UA: NEGATIVE mg/dL
Ketones, ur: NEGATIVE mg/dL
Leukocytes,Ua: NEGATIVE
Nitrite: NEGATIVE
Protein, ur: 30 mg/dL — AB
Specific Gravity, Urine: 1.023 (ref 1.005–1.030)
pH: 5.5 (ref 5.0–8.0)

## 2022-01-17 LAB — CBC WITH DIFFERENTIAL/PLATELET
Basophils Absolute: 0.1 10*3/uL (ref 0.0–0.1)
Basophils Relative: 1 % (ref 0.0–3.0)
Eosinophils Absolute: 0.6 10*3/uL (ref 0.0–0.7)
Eosinophils Relative: 9.1 % — ABNORMAL HIGH (ref 0.0–5.0)
HCT: 36.9 % (ref 36.0–46.0)
Hemoglobin: 12 g/dL (ref 12.0–15.0)
Lymphocytes Relative: 30.7 % (ref 12.0–46.0)
Lymphs Abs: 2.1 10*3/uL (ref 0.7–4.0)
MCHC: 32.7 g/dL (ref 30.0–36.0)
MCV: 88.9 fl (ref 78.0–100.0)
Monocytes Absolute: 0.8 10*3/uL (ref 0.1–1.0)
Monocytes Relative: 12.1 % — ABNORMAL HIGH (ref 3.0–12.0)
Neutro Abs: 3.2 10*3/uL (ref 1.4–7.7)
Neutrophils Relative %: 47.1 % (ref 43.0–77.0)
Platelets: 281 10*3/uL (ref 150.0–400.0)
RBC: 4.15 Mil/uL (ref 3.87–5.11)
RDW: 12.9 % (ref 11.5–15.5)
WBC: 6.7 10*3/uL (ref 4.0–10.5)

## 2022-01-17 LAB — COMPREHENSIVE METABOLIC PANEL
ALT: 20 U/L (ref 0–35)
ALT: 19 U/L (ref 0–44)
AST: 30 U/L (ref 0–37)
AST: 27 U/L (ref 15–41)
Albumin: 3.9 g/dL (ref 3.5–5.2)
Albumin: 4 g/dL (ref 3.5–5.0)
Alkaline Phosphatase: 68 U/L (ref 39–117)
Alkaline Phosphatase: 67 U/L (ref 38–126)
Anion gap: 10 (ref 5–15)
BUN: 8 mg/dL (ref 6–23)
BUN: 8 mg/dL (ref 8–23)
CO2: 27 mEq/L (ref 19–32)
CO2: 27 mmol/L (ref 22–32)
Calcium: 9 mg/dL (ref 8.4–10.5)
Calcium: 9.4 mg/dL (ref 8.9–10.3)
Chloride: 103 mEq/L (ref 96–112)
Chloride: 102 mmol/L (ref 98–111)
Creatinine, Ser: 0.79 mg/dL (ref 0.40–1.20)
Creatinine, Ser: 0.8 mg/dL (ref 0.44–1.00)
GFR, Estimated: >60 mL/min (ref >60)
GFR: 80.86 mL/min (ref >60.00)
Glucose, Bld: 101 mg/dL — ABNORMAL HIGH (ref 70–99)
Glucose, Bld: 155 mg/dL — ABNORMAL HIGH (ref 70–99)
Potassium: 4.5 mEq/L (ref 3.5–5.1)
Potassium: 3.6 mmol/L (ref 3.5–5.1)
Sodium: 136 mEq/L (ref 135–145)
Sodium: 139 mmol/L (ref 135–145)
Total Bilirubin: 0.5 mg/dL (ref 0.2–1.2)
Total Bilirubin: 0.7 mg/dL (ref 0.3–1.2)
Total Protein: 7.8 g/dL (ref 6.0–8.3)
Total Protein: 8.5 g/dL — ABNORMAL HIGH (ref 6.5–8.1)

## 2022-01-17 LAB — CBC
HCT: 38 % (ref 36.0–46.0)
Hemoglobin: 12.2 g/dL (ref 12.0–15.0)
MCH: 28.5 pg (ref 26.0–34.0)
MCHC: 32.1 g/dL (ref 30.0–36.0)
MCV: 88.8 fL (ref 80.0–100.0)
Platelets: 304 10*3/uL (ref 150–400)
RBC: 4.28 MIL/uL (ref 3.87–5.11)
RDW: 12.3 % (ref 11.5–15.5)
WBC: 8.6 10*3/uL (ref 4.0–10.5)
nRBC: 0 % (ref 0.0–0.2)

## 2022-01-17 LAB — LIPASE, BLOOD: Lipase: 32 U/L (ref 11–51)

## 2022-01-17 MED ORDER — SODIUM CHLORIDE 0.9 % IV BOLUS
1000.0000 mL | Freq: Once | INTRAVENOUS | Status: AC
  Administered 2022-01-17: 1000 mL via INTRAVENOUS

## 2022-01-17 NOTE — Discharge Instructions (Signed)
AsYou were seen today due to possible dehydration from frequent diarrhea.  Danielle Bond, please follow-up with your gastroenterologist for further evaluation and management.  This does sound consistent with your previous ulcerative colitis flares.  If you develop any life-threatening conditions please return to the emergency department for reevaluation immediately

## 2022-01-17 NOTE — ED Provider Notes (Signed)
[facility] EMERGENCY DEPT Provider Note   CSN: [id number] Arrival date & time: 01/17/22  1439     History  Chief Complaint  Patient presents with   Diarrhea    Danielle Bond is a 61 y.o. female.  Patient presents to the hospital complaining of dehydration secondary to recent episodes of diarrhea.  Patient is currently experiencing a flare of her ulcerative colitis and is followed by GI.  Patient's primary care advised her to come to the emergency department today for hydration due to over 1 straight week of frequent diarrhea.  Patient states that she does have complaints of bloody diarrhea which are currently managed by GI with plans for colonoscopy in July.  Endorses diarrhea, blood in stool, abdominal pain.  Denies chest pain, shortness of breath.  Past medical history significant for ulcerative colitis, chronic kidney disease, history of pancreatitis, osteopenia, history of asthma, history of recurrent upper respiratory infections  HPI     Home Medications Prior to Admission medications   Medication Sig Start Date End Date Taking? Authorizing Provider  albuterol (PROVENTIL) (2.5 MG/3ML) 0.083% nebulizer solution Take 3 mLs (2.5 mg total) by nebulization every 6 (six) hours as needed for wheezing or shortness of breath. 06/22/21   [name], MD  albuterol (VENTOLIN HFA) 108 (90 Base) MCG/ACT inhaler TAKE 2 PUFFS BY MOUTH EVERY 6 HOURS AS NEEDED FOR WHEEZE OR SHORTNESS OF BREATH 06/13/21   [name], MD  budesonide-formoterol [facility]) 160-4.5 MCG/ACT inhaler Inhale 2 puffs into the lungs in the morning and at bedtime. 07/20/21   Parrett, Fonnie Mu, NP  cholecalciferol (VITAMIN D3) 25 MCG (1000 UNIT) tablet Take 50,000 Units by mouth.    [provider]  EPINEPHRINE 0.3 mg/0.3 mL IJ SOAJ injection INJECT 0.3 MLS (0.3 MG TOTAL) INTO THE MUSCLE AS NEEDED FOR ANAPHYLAXIS. 04/24/21   [name], MD  fluticasone (FLONASE) 50 MCG/ACT nasal spray PLACE  2 SPRAYS INTO BOTH NOSTRILS DAILY AS NEEDED FOR ALLERGIES OR RHINITIS. 08/10/21   [name], MD  folic acid (FOLVITE) 1 MG tablet TAKE 1 TABLET BY MOUTH EVERY DAY FOR 30 DAYS 09/18/21   [name], MD  HUMIRA PEN 40 MG/0.4ML PNKT every 14 (fourteen) days.  10/23/18   [provider]  Magnesium 500 MG TABS Take 500 mg by mouth daily.    [provider]  metoprolol tartrate (LOPRESSOR) 25 MG tablet Take 1 tablet (25 mg total) by mouth daily. 01/02/22   Cantwell, Celeste C, PA-C  Multiple Vitamin (MULTIVITAMIN WITH MINERALS) TABS tablet Take 1 tablet by mouth daily.    [provider]  pantoprazole (PROTONIX) 40 MG tablet Take 40 mg by mouth daily. 09/14/19   [provider]  Riboflavin 100 MG CAPS Take 1 capsule by mouth every other day.    [provider]  rosuvastatin (CRESTOR) 10 MG tablet TAKE 1 TABLET BY MOUTH EVERY DAY 08/10/21   Patwardhan, [name], MD  SYNTHROID 100 MCG tablet Take 100 mcg by mouth every morning. 01/01/22   [provider]  triamcinolone cream (KENALOG) 0.1 % Apply 1 application topically daily as needed. 03/29/21   Sheffield, Ronalee Red, PA-C  valACYclovir (VALTREX) 500 MG tablet Take 1 tablet by mouth as needed.    [provider]  vitamin C (ASCORBIC ACID) 500 MG tablet Take 500 mg by mouth daily.    [provider]      Allergies    Almond oil, Lialda [mesalamine], Other,  Aspirin, Hydromorphone hcl, Penicillins, Acetaminophen, Apriso [mesalamine er], Corticosteroids, Imitrex [sumatriptan], Stadol [butorphanol], Topamax [topiramate], Vancomycin, Azathioprine, Pepcid [famotidine], and Prednisone    Review of Systems   Review of Systems  Constitutional:  Negative for fever.  Respiratory:  Negative for shortness of breath.   Cardiovascular:  Negative for chest pain.  Gastrointestinal:  Positive for abdominal pain, blood in stool and diarrhea. Negative for nausea and vomiting.  Genitourinary:   Negative for dysuria.    Physical Exam Updated Vital Signs BP 117/73   Pulse 80   Temp 98.3 F (36.8 C) (Oral)   Resp 16   LMP 03/03/2013   SpO2 100%  Physical Exam Vitals and nursing note reviewed.  Constitutional:      General: She is not in acute distress. HENT:     Head: Normocephalic and atraumatic.     Mouth/Throat:     Mouth: Mucous membranes are moist.  Eyes:     Conjunctiva/sclera: Conjunctivae normal.  Cardiovascular:     Rate and Rhythm: Normal rate and regular rhythm.     Pulses: Normal pulses.     Heart sounds: Normal heart sounds.  Pulmonary:     Effort: Pulmonary effort is normal.     Breath sounds: Normal breath sounds.  Abdominal:     Palpations: Abdomen is soft.     Tenderness: There is no abdominal tenderness.  Musculoskeletal:        General: Normal range of motion.     Cervical back: Normal range of motion.  Skin:    General: Skin is warm and dry.  Neurological:     Mental Status: She is alert.     ED Results / Procedures / Treatments   Labs (all labs ordered are listed, but only abnormal results are displayed) Labs Reviewed  COMPREHENSIVE METABOLIC PANEL - Abnormal; Notable for the following components:      Result Value   Glucose, Bld 155 (*)    Total Protein 8.5 (*)    All other components within normal limits  URINALYSIS, ROUTINE W REFLEX MICROSCOPIC - Abnormal; Notable for the following components:   Hgb urine dipstick TRACE (*)    Protein, ur 30 (*)    All other components within normal limits  LIPASE, BLOOD  CBC    EKG None  Radiology No results found.  Procedures Procedures    Medications Ordered in ED Medications  sodium chloride 0.9 % bolus 1,000 mL ( Intravenous Stopped 01/17/22 1824)    ED Course/ Medical Decision Making/ A&P                           Medical Decision Making  Patient presents with known diagnosis of ulcerative colitis and frequent diarrhea.  Patient was sent by PCP recommendation for  possible IV fluids due to frequent diarrhea.  Patient was administered normal saline for dehydration and upon reassessment was feeling much better.  I ordered and interpreted labs which included urinalysis showing trace hemoglobin and 30 protein, lipase 32, glucose 155, CBC grossly normal with no signs of anemia  With the patient feeling better, no signs of infection or anemia, and the patient's condition being actively followed by gastroenterology and primary care, see no need for further work-up at this time.  Patient feels much better.  I feel she may discharge home and follow-up outpatient        Final Clinical Impression(s) / ED Diagnoses Final diagnoses:  Diarrhea, unspecified type  Dehydration  Ulcerative colitis with rectal bleeding, unspecified location [facility])    Rx / DC Orders ED Discharge Orders     None         Ronny Bacon 01/17/22 1852    Lucrezia Starch, MD 01/17/22 2032

## 2022-01-17 NOTE — ED Triage Notes (Signed)
Patient here POV from Home.  Endorses History of UC and having Diarrhea for approximately 2 Weeks. States it has worsened over the past Four Days and has recently resulted in Blood in Stool with Clots.   Moderate Nausea. No Vomiting. Temperature of 100 at Home yesterday. No Urinary Symptoms.   Endorses being seen by GI Specialist and having Blood Panels/Stool Samples collected which have resulted mostly normal (Pending 1 Stool Sample). Sent for IV Hydration.   NAD Noted during Triage. A&Ox4. GCS 15. Ambulatory.

## 2022-01-19 ENCOUNTER — Telehealth: Payer: Self-pay | Admitting: Family Medicine

## 2022-01-19 NOTE — Telephone Encounter (Signed)
Please see message and advise 

## 2022-01-19 NOTE — Telephone Encounter (Signed)
Spoke to pt told her results were sent to My Chart and Aldona Bar said "It was great to see you! All of your results have returned and are overall normal. No further recommendations at this time." Pt verbalized understanding and said she does not use My Chart. Told her sorry. She said that she saw GI and she has a colon infection and that is why she is bleeding, they are putting her on Budesonide for 8 weeks and Colonoscopy is being postponed. Told her hope she feels better soon.

## 2022-01-19 NOTE — Telephone Encounter (Signed)
FYI  Patient called stating she received a stool sample result from her GI stating she had a colon infection. This seemed to be the reasoning for her bleeding as well as her ulcerative colitis. While she is still feeling bad her GI will be placing her on budesonide. She just wanted Aldona Bar to have an update and also wanted to know when Aldona Bar could interpret her labs.

## 2022-01-24 ENCOUNTER — Emergency Department (HOSPITAL_COMMUNITY): Payer: BC Managed Care – PPO

## 2022-01-24 ENCOUNTER — Inpatient Hospital Stay (HOSPITAL_COMMUNITY)
Admission: EM | Admit: 2022-01-24 | Discharge: 2022-01-26 | DRG: 387 | Disposition: A | Discharge status: Home / Self Care | Payer: BC Managed Care – PPO | Attending: Internal Medicine | Admitting: Internal Medicine

## 2022-01-24 ENCOUNTER — Encounter (HOSPITAL_COMMUNITY): Payer: Self-pay

## 2022-01-24 ENCOUNTER — Other Ambulatory Visit: Payer: Self-pay

## 2022-01-24 DIAGNOSIS — I10 Essential (primary) hypertension: Secondary | ICD-10-CM | POA: Diagnosis not present

## 2022-01-24 DIAGNOSIS — Z886 Allergy status to analgesic agent status: Secondary | ICD-10-CM | POA: Diagnosis not present

## 2022-01-24 DIAGNOSIS — Z8049 Family history of malignant neoplasm of other genital organs: Secondary | ICD-10-CM

## 2022-01-24 DIAGNOSIS — K51014 Ulcerative (chronic) pancolitis with abscess: Secondary | ICD-10-CM | POA: Diagnosis present

## 2022-01-24 DIAGNOSIS — J4540 Moderate persistent asthma, uncomplicated: Secondary | ICD-10-CM | POA: Diagnosis not present

## 2022-01-24 DIAGNOSIS — Z880 Allergy status to penicillin: Secondary | ICD-10-CM

## 2022-01-24 DIAGNOSIS — K760 Fatty (change of) liver, not elsewhere classified: Secondary | ICD-10-CM | POA: Diagnosis present

## 2022-01-24 DIAGNOSIS — Z794 Long term (current) use of insulin: Secondary | ICD-10-CM

## 2022-01-24 DIAGNOSIS — K5100 Ulcerative (chronic) pancolitis without complications: Secondary | ICD-10-CM | POA: Diagnosis present

## 2022-01-24 DIAGNOSIS — E782 Mixed hyperlipidemia: Secondary | ICD-10-CM | POA: Diagnosis not present

## 2022-01-24 DIAGNOSIS — Z79890 Hormone replacement therapy (postmenopausal): Secondary | ICD-10-CM | POA: Diagnosis not present

## 2022-01-24 DIAGNOSIS — K51011 Ulcerative (chronic) pancolitis with rectal bleeding: Principal | ICD-10-CM | POA: Diagnosis present

## 2022-01-24 DIAGNOSIS — Z825 Family history of asthma and other chronic lower respiratory diseases: Secondary | ICD-10-CM

## 2022-01-24 DIAGNOSIS — Z91018 Allergy to other foods: Secondary | ICD-10-CM

## 2022-01-24 DIAGNOSIS — Z803 Family history of malignant neoplasm of breast: Secondary | ICD-10-CM

## 2022-01-24 DIAGNOSIS — Z8249 Family history of ischemic heart disease and other diseases of the circulatory system: Secondary | ICD-10-CM

## 2022-01-24 DIAGNOSIS — K5190 Ulcerative colitis, unspecified, without complications: Secondary | ICD-10-CM | POA: Diagnosis not present

## 2022-01-24 DIAGNOSIS — E890 Postprocedural hypothyroidism: Secondary | ICD-10-CM | POA: Diagnosis present

## 2022-01-24 DIAGNOSIS — E039 Hypothyroidism, unspecified: Secondary | ICD-10-CM | POA: Diagnosis present

## 2022-01-24 DIAGNOSIS — Z79899 Other long term (current) drug therapy: Secondary | ICD-10-CM

## 2022-01-24 DIAGNOSIS — I2510 Atherosclerotic heart disease of native coronary artery without angina pectoris: Secondary | ICD-10-CM | POA: Diagnosis not present

## 2022-01-24 DIAGNOSIS — N3289 Other specified disorders of bladder: Secondary | ICD-10-CM | POA: Diagnosis not present

## 2022-01-24 DIAGNOSIS — K6389 Other specified diseases of intestine: Secondary | ICD-10-CM | POA: Diagnosis not present

## 2022-01-24 DIAGNOSIS — Z7951 Long term (current) use of inhaled steroids: Secondary | ICD-10-CM

## 2022-01-24 DIAGNOSIS — K922 Gastrointestinal hemorrhage, unspecified: Secondary | ICD-10-CM | POA: Diagnosis not present

## 2022-01-24 DIAGNOSIS — Z8349 Family history of other endocrine, nutritional and metabolic diseases: Secondary | ICD-10-CM

## 2022-01-24 DIAGNOSIS — J45909 Unspecified asthma, uncomplicated: Secondary | ICD-10-CM | POA: Diagnosis present

## 2022-01-24 DIAGNOSIS — Z881 Allergy status to other antibiotic agents status: Secondary | ICD-10-CM | POA: Diagnosis not present

## 2022-01-24 DIAGNOSIS — Z888 Allergy status to other drugs, medicaments and biological substances status: Secondary | ICD-10-CM

## 2022-01-24 DIAGNOSIS — R197 Diarrhea, unspecified: Secondary | ICD-10-CM | POA: Diagnosis not present

## 2022-01-24 DIAGNOSIS — K630 Abscess of intestine: Secondary | ICD-10-CM | POA: Diagnosis not present

## 2022-01-24 DIAGNOSIS — E785 Hyperlipidemia, unspecified: Secondary | ICD-10-CM | POA: Diagnosis present

## 2022-01-24 DIAGNOSIS — K529 Noninfective gastroenteritis and colitis, unspecified: Secondary | ICD-10-CM | POA: Diagnosis not present

## 2022-01-24 DIAGNOSIS — R1084 Generalized abdominal pain: Secondary | ICD-10-CM | POA: Diagnosis present

## 2022-01-24 DIAGNOSIS — R109 Unspecified abdominal pain: Secondary | ICD-10-CM | POA: Diagnosis not present

## 2022-01-24 DIAGNOSIS — K51919 Ulcerative colitis, unspecified with unspecified complications: Secondary | ICD-10-CM

## 2022-01-24 LAB — ABO/RH: ABO/RH(D): O NEG

## 2022-01-24 LAB — CBC WITH DIFFERENTIAL/PLATELET
Abs Immature Granulocytes: 0.08 10*3/uL — ABNORMAL HIGH (ref 0.00–0.07)
Basophils Absolute: 0.1 10*3/uL (ref 0.0–0.1)
Basophils Relative: 1 %
Eosinophils Absolute: 0.6 10*3/uL — ABNORMAL HIGH (ref 0.0–0.5)
Eosinophils Relative: 7 %
HCT: 39.6 % (ref 36.0–46.0)
Hemoglobin: 12.7 g/dL (ref 12.0–15.0)
Immature Granulocytes: 1 %
Lymphocytes Relative: 31 %
Lymphs Abs: 2.5 10*3/uL (ref 0.7–4.0)
MCH: 29.1 pg (ref 26.0–34.0)
MCHC: 32.1 g/dL (ref 30.0–36.0)
MCV: 90.8 fL (ref 80.0–100.0)
Monocytes Absolute: 0.9 10*3/uL (ref 0.1–1.0)
Monocytes Relative: 11 %
Neutro Abs: 4 10*3/uL (ref 1.7–7.7)
Neutrophils Relative %: 49 %
Platelets: 395 10*3/uL (ref 150–400)
RBC: 4.36 MIL/uL (ref 3.87–5.11)
RDW: 12.4 % (ref 11.5–15.5)
WBC: 8.1 10*3/uL (ref 4.0–10.5)
nRBC: 0 % (ref 0.0–0.2)

## 2022-01-24 LAB — COMPREHENSIVE METABOLIC PANEL
ALT: 17 U/L (ref 0–44)
AST: 23 U/L (ref 15–41)
Albumin: 3.5 g/dL (ref 3.5–5.0)
Alkaline Phosphatase: 73 U/L (ref 38–126)
Anion gap: 7 (ref 5–15)
BUN: 8 mg/dL (ref 8–23)
CO2: 28 mmol/L (ref 22–32)
Calcium: 9.1 mg/dL (ref 8.9–10.3)
Chloride: 106 mmol/L (ref 98–111)
Creatinine, Ser: 0.85 mg/dL (ref 0.44–1.00)
GFR, Estimated: >60 mL/min (ref >60)
Glucose, Bld: 145 mg/dL — ABNORMAL HIGH (ref 70–99)
Potassium: 3.6 mmol/L (ref 3.5–5.1)
Sodium: 141 mmol/L (ref 135–145)
Total Bilirubin: 0.6 mg/dL (ref 0.3–1.2)
Total Protein: 8.4 g/dL — ABNORMAL HIGH (ref 6.5–8.1)

## 2022-01-24 LAB — TYPE AND SCREEN
ABO/RH(D): O NEG
Antibody Screen: NEGATIVE

## 2022-01-24 LAB — C DIFFICILE QUICK SCREEN W PCR REFLEX
C Diff antigen: NEGATIVE
C Diff interpretation: NOT DETECTED
C Diff toxin: NEGATIVE

## 2022-01-24 LAB — HEMOGLOBIN AND HEMATOCRIT, BLOOD
HCT: 38.1 % (ref 36.0–46.0)
Hemoglobin: 12.2 g/dL (ref 12.0–15.0)

## 2022-01-24 LAB — SEDIMENTATION RATE: Sed Rate: 94 mm/hr — ABNORMAL HIGH (ref 0–22)

## 2022-01-24 LAB — LIPASE, BLOOD: Lipase: 31 U/L (ref 11–51)

## 2022-01-24 LAB — C-REACTIVE PROTEIN: CRP: 4.4 mg/dL — ABNORMAL HIGH (ref <1.0)

## 2022-01-24 MED ORDER — ALBUTEROL SULFATE (2.5 MG/3ML) 0.083% IN NEBU: 2.5000 mg | INHALATION_SOLUTION | Freq: Four times a day (QID) | RESPIRATORY_TRACT | Status: DC | PRN

## 2022-01-24 MED ORDER — OXYCODONE HCL 5 MG PO TABS: 5.0000 mg | ORAL_TABLET | ORAL | Status: DC | PRN

## 2022-01-24 MED ORDER — SODIUM CHLORIDE 0.9 % IV SOLN: INTRAVENOUS | Status: DC

## 2022-01-24 MED ORDER — PROCHLORPERAZINE EDISYLATE 10 MG/2ML IJ SOLN
10.0000 mg | Freq: Four times a day (QID) | INTRAMUSCULAR | Status: DC | PRN
  Administered 2022-01-24: 10 mg via INTRAVENOUS
  Filled 2022-01-24: qty 2

## 2022-01-24 MED ORDER — PEG 3350-KCL-NA BICARB-NACL 420 G PO SOLR
4000.0000 mL | Freq: Once | ORAL | Status: AC
  Administered 2022-01-24: 4000 mL via ORAL
  Filled 2022-01-24: qty 4000

## 2022-01-24 MED ORDER — LACTATED RINGERS IV SOLN
INTRAVENOUS | Status: DC
Start: 2022-01-24 — End: 2022-01-26

## 2022-01-24 MED ORDER — SODIUM CHLORIDE 0.9 % IV BOLUS
1000.0000 mL | Freq: Once | INTRAVENOUS | Status: AC
  Administered 2022-01-24: 1000 mL via INTRAVENOUS

## 2022-01-24 MED ORDER — ONDANSETRON HCL 4 MG/2ML IJ SOLN
4.0000 mg | Freq: Once | INTRAMUSCULAR | Status: AC
  Administered 2022-01-24: 4 mg via INTRAVENOUS
  Filled 2022-01-24: qty 2

## 2022-01-24 MED ORDER — MOMETASONE FURO-FORMOTEROL FUM 200-5 MCG/ACT IN AERO
2.0000 | INHALATION_SPRAY | Freq: Two times a day (BID) | RESPIRATORY_TRACT | Status: DC
  Filled 2022-01-24: qty 8.8

## 2022-01-24 MED ORDER — MORPHINE SULFATE (PF) 2 MG/ML IV SOLN: 2.0000 mg | INTRAVENOUS | Status: DC | PRN

## 2022-01-24 MED ORDER — FLUTICASONE PROPIONATE 50 MCG/ACT NA SUSP
2.0000 | Freq: Every day | NASAL | Status: DC | PRN
Start: 2022-01-24 — End: 2022-01-26

## 2022-01-24 MED ORDER — IOHEXOL 300 MG/ML  SOLN
100.0000 mL | Freq: Once | INTRAMUSCULAR | Status: AC | PRN
  Administered 2022-01-24: 100 mL via INTRAVENOUS

## 2022-01-24 NOTE — ED Triage Notes (Signed)
Pt reports continued rectal bleeding that has become progressively worse. Pt states that she now does not see any stool when she has a bowel movement, but only clots. Pt also endorses occasional abdominal cramping. Hx of ulcerative colitis.

## 2022-01-24 NOTE — H&P (Signed)
History and Physical    Patient: Danielle Bond VQM:[id number] DOB: [birth year] DOA: 01/24/2022 DOS: the patient was seen and examined on 01/24/2022 PCP: [name], MD  Patient coming from: Home  Chief Complaint:  Chief Complaint  Patient presents with   Rectal Bleeding   Abdominal Cramping   HPI: Danielle Bond is a 61 y.o. female with medical history significant of ulcerative colitis, hypothyroidism, HLD, moderate persistent asthma. Presenting with abdominal pain and rectal bleeding. She has had these symptoms for 2 weeks. She has generalized abdominal cramping with bloody diarrhea. She has been compliant with her home regimen for UC. She denies any NSAID use. She spoke with her PCP about it and it was recommended that she go to the ED back on 6/28. When she was examined in the ED, she was given fluids. She reported feeling better at the time. Her lab w/u was negative. It was recommended that she follow up with GI. Her symptoms continued again at home. She spoke with her GI team and was placed on budesonide. She states this has not helped. When her symptoms did not improve today, she decided to come to the ED for evaluation.   Review of Systems: As mentioned in the history of present illness. All other systems reviewed and are negative. Past Medical History:  Diagnosis Date   Asthma    Chronic kidney disease    Hx of pancreatitis 06/16/2018   Kidney stone    Kidney stone    Migraine    Orthostatic hypotension    Osteopenia 03/31/2012   Recurrent upper respiratory infection (URI)    Ulcerative colitis ([place]) 03/31/2012   Urticaria    Past Surgical History:  Procedure Laterality Date   CESAREAN SECTION     x3   CHOLECYSTECTOMY     COLONOSCOPY     LITHOTRIPSY     THYROIDECTOMY     1997   Social History:  reports that she has never smoked. She has never used smokeless tobacco. She reports that she does not drink alcohol and does not use drugs.  Allergies  Allergen Reactions    Almond Oil Hives and Shortness Of Breath   Lialda [Mesalamine] Other (See Comments)    Vomiting and rectal bleeding   Other Anaphylaxis    Almonds, tree nuts   Aspirin Hives and Nausea Only    Stomach cramps   Hydromorphone Hcl Other (See Comments)    Caused Enzyme levels to go up   Penicillins Hives and Swelling    Has patient had a PCN reaction causing immediate rash, facial/tongue/throat swelling, SOB or lightheadedness with hypotension: Yes Has patient had a PCN reaction causing severe rash involving mucus membranes or skin necrosis: No Has patient had a PCN reaction that required hospitalization: No Has patient had a PCN reaction occurring within the last 10 years: No  Stomach cramps If all of the above answers are "NO", then may proceed with Cephalosporin use.    Acetaminophen Other (See Comments)    Has autoimmune hepatitis, wants to avoid APAP   Apriso [Mesalamine Er] Nausea And Vomiting   Corticosteroids Other (See Comments)    Had acute pancreatitis x 2 after steroid use.   Imitrex [Sumatriptan] Swelling    Injection caused swelling   Stadol [Butorphanol] Other (See Comments)    hallucinates   Topamax [Topiramate] Other (See Comments)    Kidney stones   Vancomycin Hives   Azathioprine Rash   Pepcid [Famotidine] Nausea And Vomiting  Makes acid worse   Prednisone Nausea And Vomiting    All steroids causes nausea and acute pancreatitis     Family History  Problem Relation Age of Onset   Cervical cancer Paternal Aunt    Heart disease Paternal Grandmother    Heart disease Mother    Dementia Mother    Thyroid disease Mother    Osteoarthritis Mother    Asthma Mother    Heart attack Father    Breast cancer Cousin    Heart attack Brother    Heart disease Brother     Prior to Admission medications   Medication Sig Start Date End Date Taking? Authorizing Provider  albuterol (PROVENTIL) (2.5 MG/3ML) 0.083% nebulizer solution Take 3 mLs (2.5 mg total) by  nebulization every 6 (six) hours as needed for wheezing or shortness of breath. 06/22/21  Yes [name], MD  albuterol (VENTOLIN HFA) 108 (90 Base) MCG/ACT inhaler TAKE 2 PUFFS BY MOUTH EVERY 6 HOURS AS NEEDED FOR WHEEZE OR SHORTNESS OF BREATH Patient taking differently: Inhale 2 puffs into the lungs every 6 (six) hours as needed for wheezing or shortness of breath. 06/13/21  Yes [name], MD  budesonide (ENTOCORT EC) 3 MG 24 hr capsule Take 3 mg by mouth 3 (three) times daily. 01/19/22  Yes [provider]  budesonide-formoterol (SYMBICORT) 160-4.5 MCG/ACT inhaler Inhale 2 puffs into the lungs in the morning and at bedtime. 07/20/21  Yes Parrett, [name], NP  cholecalciferol (VITAMIN D3) 25 MCG (1000 UNIT) tablet Take 1,000 Units by mouth daily.   Yes [provider]  EPINEPHRINE 0.3 mg/0.3 mL IJ SOAJ injection INJECT 0.3 MLS (0.3 MG TOTAL) INTO THE MUSCLE AS NEEDED FOR ANAPHYLAXIS. Patient taking differently: Inject 0.3 mg into the muscle once as needed for anaphylaxis. 04/24/21  Yes [name], MD  fluticasone (FLONASE) 50 MCG/ACT nasal spray PLACE 2 SPRAYS INTO BOTH NOSTRILS DAILY AS NEEDED FOR ALLERGIES OR RHINITIS. 08/10/21  Yes [name], MD  folic acid (FOLVITE) 1 MG tablet TAKE 1 TABLET BY MOUTH EVERY DAY FOR 30 DAYS Patient taking differently: Take 1 mg by mouth daily. 09/18/21  Yes [name], MD  Homeopathic Products [facility] ALLERGY EYE RELIEF OP) Place 1 drop into both eyes daily as needed (allergy).   Yes [provider]  HUMIRA PEN 40 MG/0.4ML PNKT Inject 40 mg into the skin every 14 (fourteen) days. 10/23/18  Yes [provider]  Magnesium 500 MG TABS Take 500 mg by mouth daily.   Yes [provider]  metoprolol tartrate (LOPRESSOR) 25 MG tablet Take 1 tablet (25 mg total) by mouth daily. 01/02/22  Yes Cantwell, Celeste C, PA-C  Multiple Vitamin (MULTIVITAMIN WITH MINERALS) TABS tablet Take 1 tablet by mouth daily.    Yes [provider]  pantoprazole (PROTONIX) 40 MG tablet Take 40 mg by mouth daily. 09/14/19  Yes [provider]  rosuvastatin (CRESTOR) 10 MG tablet TAKE 1 TABLET BY MOUTH EVERY DAY Patient taking differently: Take 10 mg by mouth daily. 08/10/21  Yes Patwardhan, [name], MD  SYNTHROID 100 MCG tablet Take 100 mcg by mouth every morning. 01/01/22  Yes [provider]  triamcinolone cream (KENALOG) 0.1 % Apply 1 application topically daily as needed. Patient taking differently: Apply 1 application  topically daily as needed (itching). 03/29/21  Yes Sheffield, Ronalee Red, PA-C  valACYclovir (VALTREX) 500 MG tablet Take 1 tablet by mouth daily as needed (breakouts).   Yes [provider]  vitamin C (ASCORBIC ACID) 500 MG tablet Take 500 mg by mouth daily.   Yes [provider]    Physical Exam: Vitals:   01/24/22 1436 01/24/22 1445 01/24/22 1622 01/24/22 1624  BP:  (!) 143/90 122/85   Pulse:  100 92 92  Resp:  18 18   Temp:      TempSrc:      SpO2:  100% 100% 100%  Weight: 60 kg     Height: 5' (1.524 m)      General: 61 y.o. female resting in bed in NAD Eyes: PERRL, normal sclera ENMT: Nares patent w/o discharge, orophaynx clear, dentition normal, ears w/o discharge/lesions/ulcers Neck: Supple, trachea midline Cardiovascular: RRR, +S1, S2, no m/g/r, equal pulses throughout Respiratory: CTABL, no w/r/r, normal WOB GI: BS+, ND, RUQ TTP, no masses noted, no organomegaly noted MSK: No e/c/c Neuro: A&O x 3, no focal deficits Psyc: Appropriate interaction and affect, calm/cooperative  Data Reviewed:  Na+  141 K+  3.6 Glucose  145 Scr  0.85 WBC  8.1 Hgb  12.7 Plt  395  CT ab/pelvis 1. Diffuse colitis which is moderate to marked. Findings are nonspecific and may represent an infectious or inflammatory. Correlate with any history of recent antibiotic administration and symptoms that would suggest C difficile colitis and consider  close follow-up given the severity of the above findings. GI consultation may be helpful. 2. Suggestion of mild hepatic steatosis. 3. Post cholecystectomy without biliary duct dilation. 4. Aortic atherosclerosis. Aortic Atherosclerosis (ICD10-I70.0).  Assessment and Plan: Ulcerative Colitis     - admit to inpt, med-surg     - [facility] onboard, appreciate assistance     - c-scope tomorrow, NPOpMN     - stool studies sent     - pain control, anti-emetics  Moderate persistent asthma     - continue home regimen  Hypothyroidism     - continue home regimen after she comes off NPO status tomorrow  HLD     - continue home regimen after she comes off NPO status tomorrow  Advance Care Planning:   Code Status: FULL  Consults: [facility]  Family Communication: w/ family at bedside  Severity of Illness: The appropriate patient status for this patient is INPATIENT. Inpatient status is judged to be reasonable and necessary in order to provide the required intensity of service to ensure the patient's safety. The patient's presenting symptoms, physical exam findings, and initial radiographic and laboratory data in the context of their chronic comorbidities is felt to place them at high risk for further clinical deterioration. Furthermore, it is not anticipated that the patient will be medically stable for discharge from the hospital within 2 midnights of admission.   * I certify that at the point of admission it is my clinical judgment that the patient will require inpatient hospital care spanning beyond 2 midnights from the point of admission due to high intensity of service, high risk for further deterioration and high frequency of surveillance required.*  Author: Jonnie Finner, DO 01/24/2022 5:17 PM  For on call review [email or web address].

## 2022-01-24 NOTE — ED Provider Notes (Signed)
  Physical Exam  BP (!) 143/90   Pulse 100   Temp 98.4 F (36.9 C) (Oral)   Resp 18   Ht 5' (1.524 m)   Wt 60 kg   LMP 03/03/2013   SpO2 100%   BMI 25.83 kg/m   Physical Exam  Procedures  Procedures  ED Course / MDM    Medical Decision Making Care assumed at 3 PM.  Patient is here with bloody diarrhea has history of ulcerative colitis.  Dr. Therisa Doyne from GI already involved.  Signout pending CT abdomen pelvis and admission  4:05 PM CT showed diffuse colitis.  GI to dose steroids and plan to do colonoscopy tomorrow.  Hospitalist treatment  Problems Addressed: Diarrhea, unspecified type: acute illness or injury Ulcerative colitis with complication, unspecified location [facility]): acute illness or injury  Amount and/or Complexity of Data Reviewed Labs: ordered. Decision-making details documented in ED Course. Radiology: ordered and independent interpretation performed. Decision-making details documented in ED Course.  Risk Prescription drug management.          Drenda Freeze, MD 01/24/22 [phone]

## 2022-01-24 NOTE — Consult Note (Signed)
Referring Provider: [facility] Primary Care Physician:  Parker, [name], MD Primary Gastroenterologist:  Dr. Karki  Reason for Consultation:  Ulcerative colitis flare  HPI: Danielle Bond is a 61 y.o. female   Patient was diagnosed with ulcerative colitis in 2007 currently managed on Humira.  Last colonoscopy in 2019 showing pancolitis.  Patient has previously been treated with Lialda and Apriso with minimal results.  Patient presented to be seen in clinic 01/10/2022 with [name] PA-C for ulcerative colitis flare.  At that time patient reports crampy abdominal pain primarily left upper quadrant, explosive diarrhea, bloating, rectal bleeding for 3 days.  At that time she was having 4-5 loose stools daily.  GI pathogen panel at that time did not detect any organisms.  C. difficile was negative.  Fecal calprotectin elevated at 4470. sed rate was elevated at 37.  Normal CBC and CMP.  Normal CRP.  Started budesonide 9 mg daily on Friday.  She has not noticed significant improvement in her symptoms. Dr. Karki recommended if her symptoms continued to not imrpve that she should present to the ed for IV steroids and colonoscopy.   Since being seen in clinic her symptoms have worsened. She is now having 15 watery bloody bowel movements daily. Reports the blood ranges from bright red to dark red. Notes she sees a large volume of blood with every bowel movement. She continues to have LLQ abdominal cramping. She reports night sweats every other night.   Prior to this flare she was having 3 somewhat formed non-bloody and painless bowel movements daily.   Last humira dose was last Wednesday.  Denies family history of colon cancer.   Past Medical History:  Diagnosis Date   Asthma    Chronic kidney disease    Hx of pancreatitis 06/16/2018   Kidney stone    Kidney stone    Migraine    Orthostatic hypotension    Osteopenia 03/31/2012   Recurrent upper respiratory infection (URI)    Ulcerative colitis (HCC)  03/31/2012   Urticaria     Past Surgical History:  Procedure Laterality Date   CESAREAN SECTION     x3   CHOLECYSTECTOMY     COLONOSCOPY     LITHOTRIPSY     THYROIDECTOMY     1997    Prior to Admission medications   Medication Sig Start Date End Date Taking? Authorizing Provider  albuterol (PROVENTIL) (2.5 MG/3ML) 0.083% nebulizer solution Take 3 mLs (2.5 mg total) by nebulization every 6 (six) hours as needed for wheezing or shortness of breath. 06/22/21   Parker, [name], MD  albuterol (VENTOLIN HFA) 108 (90 Base) MCG/ACT inhaler TAKE 2 PUFFS BY MOUTH EVERY 6 HOURS AS NEEDED FOR WHEEZE OR SHORTNESS OF BREATH 06/13/21   Parker, [name], MD  budesonide-formoterol (SYMBICORT) 160-4.5 MCG/ACT inhaler Inhale 2 puffs into the lungs in the morning and at bedtime. 07/20/21   Parrett, [name], NP  cholecalciferol (VITAMIN D3) 25 MCG (1000 UNIT) tablet Take 50,000 Units by mouth.    [provider]  EPINEPHRINE 0.3 mg/0.3 mL IJ SOAJ injection INJECT 0.3 MLS (0.3 MG TOTAL) INTO THE MUSCLE AS NEEDED FOR ANAPHYLAXIS. 04/24/21   Parker, [name], MD  fluticasone (FLONASE) 50 MCG/ACT nasal spray PLACE 2 SPRAYS INTO BOTH NOSTRILS DAILY AS NEEDED FOR ALLERGIES OR RHINITIS. 08/10/21   Parker, [name], MD  folic acid (FOLVITE) 1 MG tablet TAKE 1 TABLET BY MOUTH EVERY DAY FOR 30 DAYS 09/18/21   Parker, [name],   MD  HUMIRA PEN 40 MG/0.4ML PNKT every 14 (fourteen) days.  10/23/18   [provider]  Magnesium 500 MG TABS Take 500 mg by mouth daily.    [provider]  metoprolol tartrate (LOPRESSOR) 25 MG tablet Take 1 tablet (25 mg total) by mouth daily. 01/02/22   Cantwell, Celeste C, PA-C  Multiple Vitamin (MULTIVITAMIN WITH MINERALS) TABS tablet Take 1 tablet by mouth daily.    [provider]  pantoprazole (PROTONIX) 40 MG tablet Take 40 mg by mouth daily. 09/14/19   [provider]  Riboflavin 100 MG CAPS Take 1 capsule by mouth every other day.    [provider]  rosuvastatin (CRESTOR) 10 MG tablet TAKE 1 TABLET BY MOUTH EVERY DAY 08/10/21   Patwardhan, [name], MD  SYNTHROID 100 MCG tablet Take 100 mcg by mouth every morning. 01/01/22   [provider]  triamcinolone cream (KENALOG) 0.1 % Apply 1 application topically daily as needed. 03/29/21   Sheffield, Ronalee Red, PA-C  valACYclovir (VALTREX) 500 MG tablet Take 1 tablet by mouth as needed.    [provider]  vitamin C (ASCORBIC ACID) 500 MG tablet Take 500 mg by mouth daily.    [provider]    Scheduled Meds:  polyethylene glycol-electrolytes  4,000 mL Oral Once   Continuous Infusions:  lactated ringers     PRN Meds:.  Allergies as of 01/24/2022 - Review Complete 01/24/2022  Allergen Reaction Noted   Almond oil Hives and Shortness Of Breath 12/18/2013   Lialda [mesalamine] Other (See Comments) 08/21/2018   Other Anaphylaxis 03/31/2012   Aspirin Hives and Nausea Only 03/31/2012   Hydromorphone hcl Other (See Comments) 08/12/2014   Penicillins Hives and Swelling 03/31/2012   Acetaminophen Other (See Comments) 01/16/2014   Apriso [mesalamine er] Nausea And Vomiting 03/03/2019   Corticosteroids Other (See Comments) 12/18/2013   Imitrex [sumatriptan] Swelling 03/31/2012   Stadol [butorphanol] Other (See Comments) 11/06/2012   Topamax [topiramate] Other (See Comments) 12/23/2017   Vancomycin Hives 11/06/2012   Azathioprine Rash 04/15/2014   Pepcid [famotidine] Nausea And Vomiting 03/31/2012   Prednisone Nausea And Vomiting 03/31/2012    Family History  Problem Relation Age of Onset   Cervical cancer Paternal Aunt    Heart disease Paternal Grandmother    Heart disease Mother    Dementia Mother    Thyroid disease Mother    Osteoarthritis Mother    Asthma Mother    Heart attack Father    Breast cancer Cousin    Heart attack Brother    Heart disease Brother     Social History   Socioeconomic History   Marital status: Married    Spouse  name: Not on file   Number of children: 2   Years of education: some college   Highest education level: Not on file  Occupational History   Occupation: Self-employed  Tobacco Use   Smoking status: Never   Smokeless tobacco: Never  Vaping Use   Vaping Use: Never used  Substance and Sexual Activity   Alcohol use: No   Drug use: No   Sexual activity: Yes  Other Topics Concern   Not on file  Social History Narrative   Lives at home with her husband.   Right-handed.   No caffeine use.   Social Determinants of Health   Financial Resource Strain: Not on file  Food Insecurity: Not on file  Transportation Needs: Not on file  Physical Activity: Not on file  Stress:  Not on file  Social Connections: Not on file  Intimate Partner Violence: Not on file    Review of Systems: Review of Systems  Constitutional:  Positive for malaise/fatigue. Negative for chills and fever.  HENT:  Negative for hearing loss and tinnitus.   Eyes:  Negative for blurred vision and double vision.  Respiratory:  Negative for cough and hemoptysis.   Cardiovascular:  Negative for chest pain and palpitations.  Gastrointestinal:  Positive for abdominal pain, blood in stool and diarrhea. Negative for constipation, heartburn, melena, nausea and vomiting.  Genitourinary:  Negative for dysuria and urgency.  Musculoskeletal:  Negative for myalgias and neck pain.  Skin:  Negative for itching and rash.  Neurological:  Positive for weakness. Negative for dizziness and headaches.  Endo/Heme/Allergies:  Negative for environmental allergies. Does not bruise/bleed easily.  Psychiatric/Behavioral:  Negative for depression, substance abuse and suicidal ideas.      Physical Exam:Physical Exam Constitutional:      General: She is not in acute distress.    Appearance: Normal appearance. She is normal weight.  HENT:     Head: Normocephalic and atraumatic.     Right Ear: External ear normal.     Left Ear: External ear  normal.     Nose: Nose normal.     Mouth/Throat:     Mouth: Mucous membranes are moist.  Eyes:     Pupils: Pupils are equal, round, and reactive to light.  Cardiovascular:     Rate and Rhythm: Normal rate and regular rhythm.     Pulses: Normal pulses.     Heart sounds: Normal heart sounds.  Pulmonary:     Effort: Pulmonary effort is normal.     Breath sounds: Normal breath sounds.  Abdominal:     General: Abdomen is flat. Bowel sounds are normal. There is no distension.     Palpations: Abdomen is soft. There is no mass.     Tenderness: There is abdominal tenderness (LUQ, epigastric). There is no guarding or rebound.     Hernia: No hernia is present.  Musculoskeletal:        General: No swelling. Normal range of motion.     Cervical back: Normal range of motion and neck supple.  Skin:    General: Skin is warm and dry.     Coloration: Skin is not pale.  Neurological:     General: No focal deficit present.     Mental Status: She is alert and oriented to person, place, and time. Mental status is at baseline.  Psychiatric:        Mood and Affect: Mood normal.        Behavior: Behavior normal.     Physical Activity: Not on file  ; Vital signs: Vitals:   01/24/22 1430 01/24/22 1445  BP: 121/85 (!) 143/90  Pulse: 87 100  Resp: 18 18  Temp:    SpO2: 98% 100%        GI:  Lab Results: Recent Labs    01/24/22 1405  WBC 8.1  HGB 12.7  HCT 39.6  PLT 395   BMET Recent Labs    01/24/22 1405  NA 141  K 3.6  CL 106  CO2 28  GLUCOSE 145*  BUN 8  CREATININE 0.85  CALCIUM 9.1   LFT Recent Labs    01/24/22 1405  PROT 8.4*  ALBUMIN 3.5  AST 23  ALT 17  ALKPHOS 73  BILITOT 0.6   PT/INR No results for input(s): "LABPROT", "  INR" in the last 72 hours.   Studies/Results: CT ABDOMEN PELVIS W CONTRAST  Result Date: 01/24/2022 CLINICAL DATA:  A 62 year old female presents for evaluation of acute abdominal pain and rectal bleeding. EXAM: CT ABDOMEN AND PELVIS  WITH CONTRAST TECHNIQUE: Multidetector CT imaging of the abdomen and pelvis was performed using the standard protocol following bolus administration of intravenous contrast. RADIATION DOSE REDUCTION: This exam was performed according to the departmental dose-optimization program which includes automated exposure control, adjustment of the mA and/or kV according to patient size and/or use of iterative reconstruction technique. CONTRAST:  131m OMNIPAQUE IOHEXOL 300 MG/ML  SOLN COMPARISON:  June 15, 2019. FINDINGS: Lower chest: Incidental imaging of the lung bases without effusion or sign of consolidative process. Hepatobiliary: Smooth hepatic contours. Post cholecystectomy without biliary duct dilation. No focal, suspicious hepatic lesion. Suggestion of mild hepatic steatosis. Pancreas: Normal, without mass, inflammation or ductal dilatation. Spleen: Normal. Adrenals/Urinary Tract: Adrenal glands are unremarkable. Symmetric renal enhancement. No sign of hydronephrosis. No suspicious renal lesion or perinephric stranding. Urinary bladder is grossly unremarkable. Urinary bladder is collapsed limiting assessment. Stomach/Bowel: No signs of acute gastric or small bowel process. The appendix is normal. Diffuse colitis evidence by colonic wall thickening, pericolonic hypervascularity and pericolonic stranding. This affects the entire colon. There is no pneumatosis. There is no sign of colonic obstruction. No gross abnormality associated with the terminal ileum. Vascular/Lymphatic: Portal vein is patent as is the SMV. Aortic caliber is normal. Vessels in the abdomen are patent on this venous phase study. Scattered aortic atherosclerosis. IVC with smooth contours. There is no gastrohepatic or hepatoduodenal ligament lymphadenopathy. No retroperitoneal or mesenteric lymphadenopathy. No pelvic sidewall lymphadenopathy. Reproductive: Unremarkable by CT. Other: No ascites. Musculoskeletal: No acute or significant osseous  findings. IMPRESSION: 1. Diffuse colitis which is moderate to marked. Findings are nonspecific and may represent an infectious or inflammatory. Correlate with any history of recent antibiotic administration and symptoms that would suggest C difficile colitis and consider close follow-up given the severity of the above findings. GI consultation may be helpful. 2. Suggestion of mild hepatic steatosis. 3. Post cholecystectomy without biliary duct dilation. 4. Aortic atherosclerosis. Aortic Atherosclerosis (ICD10-I70.0). Electronically Signed   By: GZetta BillsM.D.   On: 01/24/2022 15:16    Impression: Ulcerative colitis  Rectal bleeding  Diarrhea   Hgb 12.7, WBC 8.1, normal CMP, normal LFTs.    Negative GI pathogen panel and C. Dif 01/10/22. CT scan showed diffuse colitis, possible stool infectious cause.  We will repeat GI pathogen panel and C. difficile testing. Worsening rectal bleeding and diarrhea not improved with budesonide 9 mg daily, Previously elevated fecal calprotectin and ESR.  Possible ulcerative colitis flare.  CT abdomen pelvis 01/24/2022 Diffuse colitis possible infectious or inflammatory.  Mild hepatic steatosis, postcholecystectomy without biliary duct dilation.  Plan: Plan for Colonoscopy tomorrow. I thoroughly discussed the procedures to include nature, alternatives, benefits, and risks including but not limited to bleeding, perforation, infection, anesthesia/cardiac and pulmonary complications. Patient provides understanding and gave verbal consent to proceed. Continue Protonix 40 mg IV BID. Will repeat GI pathogen panel and C. Difficile possible infectious colitis Will repeat ESR, CRP and fecal calprotectin for evaluation of ulcerative colitis flare.  Nulytely prep, clear liquid diet, NPO at midnight. Continue daily CBC with transfusion as needed to maintain Hgb >7.  [facility] will follow.     LOS: 0 days   GCharlott Rakes PA-C 01/24/2022, 3:46 PM  Contact #   [phone]

## 2022-01-24 NOTE — H&P (View-Only) (Signed)
Referring Provider: [facility] Primary Care Physician:  [name], MD Primary Gastroenterologist:  Dr. Therisa Doyne  Reason for Consultation:  Ulcerative colitis flare  HPI: Danielle Bond is a 61 y.o. female   Patient was diagnosed with ulcerative colitis in 2007 currently managed on Humira.  Last colonoscopy in 2019 showing pancolitis.  Patient has previously been treated with Lialda and Apriso with minimal results.  Patient presented to be seen in clinic 01/10/2022 with [name] PA-C for ulcerative colitis flare.  At that time patient reports crampy abdominal pain primarily left upper quadrant, explosive diarrhea, bloating, rectal bleeding for 3 days.  At that time she was having 4-5 loose stools daily.  GI pathogen panel at that time did not detect any organisms.  C. difficile was negative.  Fecal calprotectin elevated at 4470. sed rate was elevated at 37.  Normal CBC and CMP.  Normal CRP.  Started budesonide 9 mg daily on Friday.  She has not noticed significant improvement in her symptoms. Dr. Therisa Doyne recommended if her symptoms continued to not imrpve that she should present to the ed for IV steroids and colonoscopy.   Since being seen in clinic her symptoms have worsened. She is now having 15 watery bloody bowel movements daily. Reports the blood ranges from bright red to dark red. Notes she sees a large volume of blood with every bowel movement. She continues to have LLQ abdominal cramping. She reports night sweats every other night.   Prior to this flare she was having 3 somewhat formed non-bloody and painless bowel movements daily.   Last humira dose was last Wednesday.  Denies family history of colon cancer.   Past Medical History:  Diagnosis Date   Asthma    Chronic kidney disease    Hx of pancreatitis 06/16/2018   Kidney stone    Kidney stone    Migraine    Orthostatic hypotension    Osteopenia 03/31/2012   Recurrent upper respiratory infection (URI)    Ulcerative colitis ([place])  03/31/2012   Urticaria     Past Surgical History:  Procedure Laterality Date   CESAREAN SECTION     x3   CHOLECYSTECTOMY     COLONOSCOPY     LITHOTRIPSY     THYROIDECTOMY     1997    Prior to Admission medications   Medication Sig Start Date End Date Taking? Authorizing Provider  albuterol (PROVENTIL) (2.5 MG/3ML) 0.083% nebulizer solution Take 3 mLs (2.5 mg total) by nebulization every 6 (six) hours as needed for wheezing or shortness of breath. 06/22/21   [name], MD  albuterol (VENTOLIN HFA) 108 (90 Base) MCG/ACT inhaler TAKE 2 PUFFS BY MOUTH EVERY 6 HOURS AS NEEDED FOR WHEEZE OR SHORTNESS OF BREATH 06/13/21   [name], MD  budesonide-formoterol [facility]) 160-4.5 MCG/ACT inhaler Inhale 2 puffs into the lungs in the morning and at bedtime. 07/20/21   Parrett, Fonnie Mu, NP  cholecalciferol (VITAMIN D3) 25 MCG (1000 UNIT) tablet Take 50,000 Units by mouth.    [provider]  EPINEPHRINE 0.3 mg/0.3 mL IJ SOAJ injection INJECT 0.3 MLS (0.3 MG TOTAL) INTO THE MUSCLE AS NEEDED FOR ANAPHYLAXIS. 04/24/21   [name], MD  fluticasone (FLONASE) 50 MCG/ACT nasal spray PLACE 2 SPRAYS INTO BOTH NOSTRILS DAILY AS NEEDED FOR ALLERGIES OR RHINITIS. 08/10/21   [name], MD  folic acid (FOLVITE) 1 MG tablet TAKE 1 TABLET BY MOUTH EVERY DAY FOR 30 DAYS 09/18/21   [name],  MD  HUMIRA PEN 40 MG/0.4ML PNKT every 14 (fourteen) days.  10/23/18   [provider]  Magnesium 500 MG TABS Take 500 mg by mouth daily.    [provider]  metoprolol tartrate (LOPRESSOR) 25 MG tablet Take 1 tablet (25 mg total) by mouth daily. 01/02/22   Cantwell, Celeste C, PA-C  Multiple Vitamin (MULTIVITAMIN WITH MINERALS) TABS tablet Take 1 tablet by mouth daily.    [provider]  pantoprazole (PROTONIX) 40 MG tablet Take 40 mg by mouth daily. 09/14/19   [provider]  Riboflavin 100 MG CAPS Take 1 capsule by mouth every other day.    [provider]  rosuvastatin (CRESTOR) 10 MG tablet TAKE 1 TABLET BY MOUTH EVERY DAY 08/10/21   Patwardhan, [name], MD  SYNTHROID 100 MCG tablet Take 100 mcg by mouth every morning. 01/01/22   [provider]  triamcinolone cream (KENALOG) 0.1 % Apply 1 application topically daily as needed. 03/29/21   Sheffield, Ronalee Red, PA-C  valACYclovir (VALTREX) 500 MG tablet Take 1 tablet by mouth as needed.    [provider]  vitamin C (ASCORBIC ACID) 500 MG tablet Take 500 mg by mouth daily.    [provider]    Scheduled Meds:  polyethylene glycol-electrolytes  4,000 mL Oral Once   Continuous Infusions:  lactated ringers     PRN Meds:.  Allergies as of 01/24/2022 - Review Complete 01/24/2022  Allergen Reaction Noted   Almond oil Hives and Shortness Of Breath 12/18/2013   Lialda [mesalamine] Other (See Comments) 08/21/2018   Other Anaphylaxis 03/31/2012   Aspirin Hives and Nausea Only 03/31/2012   Hydromorphone hcl Other (See Comments) 08/12/2014   Penicillins Hives and Swelling 03/31/2012   Acetaminophen Other (See Comments) 01/16/2014   Apriso [mesalamine er] Nausea And Vomiting 03/03/2019   Corticosteroids Other (See Comments) 12/18/2013   Imitrex [sumatriptan] Swelling 03/31/2012   Stadol [butorphanol] Other (See Comments) 11/06/2012   Topamax [topiramate] Other (See Comments) 12/23/2017   Vancomycin Hives 11/06/2012   Azathioprine Rash 04/15/2014   Pepcid [famotidine] Nausea And Vomiting 03/31/2012   Prednisone Nausea And Vomiting 03/31/2012    Family History  Problem Relation Age of Onset   Cervical cancer Paternal Aunt    Heart disease Paternal Grandmother    Heart disease Mother    Dementia Mother    Thyroid disease Mother    Osteoarthritis Mother    Asthma Mother    Heart attack Father    Breast cancer Cousin    Heart attack Brother    Heart disease Brother     Social History   Socioeconomic History   Marital status: Married    Spouse  name: Not on file   Number of children: 2   Years of education: some college   Highest education level: Not on file  Occupational History   Occupation: Self-employed  Tobacco Use   Smoking status: Never   Smokeless tobacco: Never  Vaping Use   Vaping Use: Never used  Substance and Sexual Activity   Alcohol use: No   Drug use: No   Sexual activity: Yes  Other Topics Concern   Not on file  Social History Narrative   Lives at home with her husband.   Right-handed.   No caffeine use.   Social Determinants of Health   Financial Resource Strain: Not on file  Food Insecurity: Not on file  Transportation Needs: Not on file  Physical Activity: Not on file  Stress:  Not on file  Social Connections: Not on file  Intimate Partner Violence: Not on file    Review of Systems: Review of Systems  Constitutional:  Positive for malaise/fatigue. Negative for chills and fever.  HENT:  Negative for hearing loss and tinnitus.   Eyes:  Negative for blurred vision and double vision.  Respiratory:  Negative for cough and hemoptysis.   Cardiovascular:  Negative for chest pain and palpitations.  Gastrointestinal:  Positive for abdominal pain, blood in stool and diarrhea. Negative for constipation, heartburn, melena, nausea and vomiting.  Genitourinary:  Negative for dysuria and urgency.  Musculoskeletal:  Negative for myalgias and neck pain.  Skin:  Negative for itching and rash.  Neurological:  Positive for weakness. Negative for dizziness and headaches.  Endo/Heme/Allergies:  Negative for environmental allergies. Does not bruise/bleed easily.  Psychiatric/Behavioral:  Negative for depression, substance abuse and suicidal ideas.      Physical Exam:Physical Exam Constitutional:      General: She is not in acute distress.    Appearance: Normal appearance. She is normal weight.  HENT:     Head: Normocephalic and atraumatic.     Right Ear: External ear normal.     Left Ear: External ear  normal.     Nose: Nose normal.     Mouth/Throat:     Mouth: Mucous membranes are moist.  Eyes:     Pupils: Pupils are equal, round, and reactive to light.  Cardiovascular:     Rate and Rhythm: Normal rate and regular rhythm.     Pulses: Normal pulses.     Heart sounds: Normal heart sounds.  Pulmonary:     Effort: Pulmonary effort is normal.     Breath sounds: Normal breath sounds.  Abdominal:     General: Abdomen is flat. Bowel sounds are normal. There is no distension.     Palpations: Abdomen is soft. There is no mass.     Tenderness: There is abdominal tenderness (LUQ, epigastric). There is no guarding or rebound.     Hernia: No hernia is present.  Musculoskeletal:        General: No swelling. Normal range of motion.     Cervical back: Normal range of motion and neck supple.  Skin:    General: Skin is warm and dry.     Coloration: Skin is not pale.  Neurological:     General: No focal deficit present.     Mental Status: She is alert and oriented to person, place, and time. Mental status is at baseline.  Psychiatric:        Mood and Affect: Mood normal.        Behavior: Behavior normal.     Physical Activity: Not on file  ; Vital signs: Vitals:   01/24/22 1430 01/24/22 1445  BP: 121/85 (!) 143/90  Pulse: 87 100  Resp: 18 18  Temp:    SpO2: 98% 100%        GI:  Lab Results: Recent Labs    01/24/22 1405  WBC 8.1  HGB 12.7  HCT 39.6  PLT 395   BMET Recent Labs    01/24/22 1405  NA 141  K 3.6  CL 106  CO2 28  GLUCOSE 145*  BUN 8  CREATININE 0.85  CALCIUM 9.1   LFT Recent Labs    01/24/22 1405  PROT 8.4*  ALBUMIN 3.5  AST 23  ALT 17  ALKPHOS 73  BILITOT 0.6   PT/INR No results for input(s): "LABPROT", "  INR" in the last 72 hours.   Studies/Results: CT ABDOMEN PELVIS W CONTRAST  Result Date: 01/24/2022 CLINICAL DATA:  A 61 year old female presents for evaluation of acute abdominal pain and rectal bleeding. EXAM: CT ABDOMEN AND PELVIS  WITH CONTRAST TECHNIQUE: Multidetector CT imaging of the abdomen and pelvis was performed using the standard protocol following bolus administration of intravenous contrast. RADIATION DOSE REDUCTION: This exam was performed according to the departmental dose-optimization program which includes automated exposure control, adjustment of the mA and/or kV according to patient size and/or use of iterative reconstruction technique. CONTRAST:  130m OMNIPAQUE IOHEXOL 300 MG/ML  SOLN COMPARISON:  June 15, 2019. FINDINGS: Lower chest: Incidental imaging of the lung bases without effusion or sign of consolidative process. Hepatobiliary: Smooth hepatic contours. Post cholecystectomy without biliary duct dilation. No focal, suspicious hepatic lesion. Suggestion of mild hepatic steatosis. Pancreas: Normal, without mass, inflammation or ductal dilatation. Spleen: Normal. Adrenals/Urinary Tract: Adrenal glands are unremarkable. Symmetric renal enhancement. No sign of hydronephrosis. No suspicious renal lesion or perinephric stranding. Urinary bladder is grossly unremarkable. Urinary bladder is collapsed limiting assessment. Stomach/Bowel: No signs of acute gastric or small bowel process. The appendix is normal. Diffuse colitis evidence by colonic wall thickening, pericolonic hypervascularity and pericolonic stranding. This affects the entire colon. There is no pneumatosis. There is no sign of colonic obstruction. No gross abnormality associated with the terminal ileum. Vascular/Lymphatic: Portal vein is patent as is the SMV. Aortic caliber is normal. Vessels in the abdomen are patent on this venous phase study. Scattered aortic atherosclerosis. IVC with smooth contours. There is no gastrohepatic or hepatoduodenal ligament lymphadenopathy. No retroperitoneal or mesenteric lymphadenopathy. No pelvic sidewall lymphadenopathy. Reproductive: Unremarkable by CT. Other: No ascites. Musculoskeletal: No acute or significant osseous  findings. IMPRESSION: 1. Diffuse colitis which is moderate to marked. Findings are nonspecific and may represent an infectious or inflammatory. Correlate with any history of recent antibiotic administration and symptoms that would suggest C difficile colitis and consider close follow-up given the severity of the above findings. GI consultation may be helpful. 2. Suggestion of mild hepatic steatosis. 3. Post cholecystectomy without biliary duct dilation. 4. Aortic atherosclerosis. Aortic Atherosclerosis (ICD10-I70.0). Electronically Signed   By: GZetta BillsM.D.   On: 01/24/2022 15:16    Impression: Ulcerative colitis  Rectal bleeding  Diarrhea   Hgb 12.7, WBC 8.1, normal CMP, normal LFTs.    Negative GI pathogen panel and C. Dif 01/10/22. CT scan showed diffuse colitis, possible stool infectious cause.  We will repeat GI pathogen panel and C. difficile testing. Worsening rectal bleeding and diarrhea not improved with budesonide 9 mg daily, Previously elevated fecal calprotectin and ESR.  Possible ulcerative colitis flare.  CT abdomen pelvis 01/24/2022 Diffuse colitis possible infectious or inflammatory.  Mild hepatic steatosis, postcholecystectomy without biliary duct dilation.  Plan: Plan for Colonoscopy tomorrow. I thoroughly discussed the procedures to include nature, alternatives, benefits, and risks including but not limited to bleeding, perforation, infection, anesthesia/cardiac and pulmonary complications. Patient provides understanding and gave verbal consent to proceed. Continue Protonix 40 mg IV BID. Will repeat GI pathogen panel and C. Difficile possible infectious colitis Will repeat ESR, CRP and fecal calprotectin for evaluation of ulcerative colitis flare.  Nulytely prep, clear liquid diet, NPO at midnight. Continue daily CBC with transfusion as needed to maintain Hgb >7.  [facility] will follow.     LOS: 0 days   GCharlott Rakes PA-C 01/24/2022, 3:46 PM  Contact #   [phone]

## 2022-01-24 NOTE — ED Provider Notes (Signed)
[place] DEPT Provider Note   CSN: [id number] Arrival date & time: 01/24/22  1339     History  Chief Complaint  Patient presents with   Rectal Bleeding   Abdominal Cramping    Danielle Bond is a 61 y.o. female.  61 year old female with prior medical history as detailed below presents for evaluation.  Patient is known to [facility] and Dr. Therisa Doyne.  Patient with history of ulcerative colitis.  Patient reports significant diarrhea and bloody stools is ongoing issue for the last week.  Patient was seen last week for same complaint at [facility] ED.  Patient returns today with complaint of continued diarrheal stools and bloody stools.  Patient reports that she contacted Dr. Deno Etienne and was told to come to the ED for evaluation and admission.    The history is provided by the patient and medical records.  Rectal Bleeding Quality:  [place] Amount:  Moderate Duration:  5 days Timing:  Sporadic Chronicity:  New Similar prior episodes: yes   Abdominal Cramping       Home Medications Prior to Admission medications   Medication Sig Start Date End Date Taking? Authorizing Provider  albuterol (PROVENTIL) (2.5 MG/3ML) 0.083% nebulizer solution Take 3 mLs (2.5 mg total) by nebulization every 6 (six) hours as needed for wheezing or shortness of breath. 06/22/21   [name], MD  albuterol (VENTOLIN HFA) 108 (90 Base) MCG/ACT inhaler TAKE 2 PUFFS BY MOUTH EVERY 6 HOURS AS NEEDED FOR WHEEZE OR SHORTNESS OF BREATH 06/13/21   [name], MD  budesonide-formoterol [facility]) 160-4.5 MCG/ACT inhaler Inhale 2 puffs into the lungs in the morning and at bedtime. 07/20/21   Parrett, Fonnie Mu, NP  cholecalciferol (VITAMIN D3) 25 MCG (1000 UNIT) tablet Take 50,000 Units by mouth.    [provider]  EPINEPHRINE 0.3 mg/0.3 mL IJ SOAJ injection INJECT 0.3 MLS (0.3 MG TOTAL) INTO THE MUSCLE AS NEEDED FOR ANAPHYLAXIS. 04/24/21   [name], MD   fluticasone (FLONASE) 50 MCG/ACT nasal spray PLACE 2 SPRAYS INTO BOTH NOSTRILS DAILY AS NEEDED FOR ALLERGIES OR RHINITIS. 08/10/21   [name], MD  folic acid (FOLVITE) 1 MG tablet TAKE 1 TABLET BY MOUTH EVERY DAY FOR 30 DAYS 09/18/21   [name], MD  HUMIRA PEN 40 MG/0.4ML PNKT every 14 (fourteen) days.  10/23/18   [provider]  Magnesium 500 MG TABS Take 500 mg by mouth daily.    [provider]  metoprolol tartrate (LOPRESSOR) 25 MG tablet Take 1 tablet (25 mg total) by mouth daily. 01/02/22   Cantwell, Celeste C, PA-C  Multiple Vitamin (MULTIVITAMIN WITH MINERALS) TABS tablet Take 1 tablet by mouth daily.    [provider]  pantoprazole (PROTONIX) 40 MG tablet Take 40 mg by mouth daily. 09/14/19   [provider]  Riboflavin 100 MG CAPS Take 1 capsule by mouth every other day.    [provider]  rosuvastatin (CRESTOR) 10 MG tablet TAKE 1 TABLET BY MOUTH EVERY DAY 08/10/21   Patwardhan, [name], MD  SYNTHROID 100 MCG tablet Take 100 mcg by mouth every morning. 01/01/22   [provider]  triamcinolone cream (KENALOG) 0.1 % Apply 1 application topically daily as needed. 03/29/21   Sheffield, Ronalee Red, PA-C  valACYclovir (VALTREX) 500 MG tablet Take 1 tablet by mouth as needed.    [provider]  vitamin C (ASCORBIC ACID) 500 MG tablet Take 500 mg by mouth daily.  [provider]      Allergies    Almond oil, Lialda [mesalamine], Other, Aspirin, Hydromorphone hcl, Penicillins, Acetaminophen, Apriso [mesalamine er], Corticosteroids, Imitrex [sumatriptan], Stadol [butorphanol], Topamax [topiramate], Vancomycin, Azathioprine, Pepcid [famotidine], and Prednisone    Review of Systems   Review of Systems  Gastrointestinal:  Positive for hematochezia.  All other systems reviewed and are negative.   Physical Exam Updated Vital Signs BP (!) 153/112 (BP Location: Left Arm)   Pulse 100   Temp 98.4 F (36.9 C)  (Oral)   Resp 18   LMP 03/03/2013   SpO2 97%  Physical Exam Vitals and nursing note reviewed.  Constitutional:      General: She is not in acute distress.    Appearance: Normal appearance. She is well-developed.  HENT:     Head: Normocephalic and atraumatic.  Eyes:     Conjunctiva/sclera: Conjunctivae normal.     Pupils: Pupils are equal, round, and reactive to light.  Cardiovascular:     Rate and Rhythm: Normal rate and regular rhythm.     Heart sounds: Normal heart sounds.  Pulmonary:     Effort: Pulmonary effort is normal. No respiratory distress.     Breath sounds: Normal breath sounds.  Abdominal:     General: There is no distension.     Palpations: Abdomen is soft.     Tenderness: There is no abdominal tenderness.  Musculoskeletal:        General: No deformity. Normal range of motion.     Cervical back: Normal range of motion and neck supple.  Skin:    General: Skin is warm and dry.  Neurological:     General: No focal deficit present.     Mental Status: She is alert and oriented to person, place, and time.     ED Results / Procedures / Treatments   Labs (all labs ordered are listed, but only abnormal results are displayed) Labs Reviewed  CBC WITH DIFFERENTIAL/PLATELET  COMPREHENSIVE METABOLIC PANEL  LIPASE, BLOOD  TYPE AND SCREEN    EKG None  Radiology No results found.  Procedures Procedures    Medications Ordered in ED Medications  sodium chloride 0.9 % bolus 1,000 mL (has no administration in time range)  ondansetron (ZOFRAN) injection 4 mg (has no administration in time range)    ED Course/ Medical Decision Making/ A&P                           Medical Decision Making Amount and/or Complexity of Data Reviewed Labs: ordered. Radiology: ordered.  Risk Prescription drug management.    Medical Screen Complete  This patient presented to the ED with complaint of diarrhea, bloody stools.  This complaint involves an extensive number of  treatment options. The initial differential diagnosis includes, but is not limited to, UC flare, metabolic abnormality, etc.   This presentation is: Acute, Chronic, Self-Limited, Previously Undiagnosed, Uncertain Prognosis, Complicated, Systemic Symptoms, and Threat to Life/Bodily Function  Patient is presenting with diarrhea and bloody stools.  Patient is reporting symptoms are consistent with likely ulcerative colitis flare.  Patient is known to Dr. Therisa Doyne with [facility].  Case discussed briefly with Dr. Therisa Doyne.  Patient is to be admitted to the medicine service.  Patient to receive CT imaging today and stool to be obtained for pathogens.  Additionally, patient will likely receive colonoscopy as inpatient. Dr. Therisa Doyne will consult.  Dr. Darl Householder aware of pending studies and need to admit.  Additional history obtained:  External records from outside sources obtained and reviewed including prior ED visits and prior Inpatient records.    Lab Tests:  I ordered and personally interpreted labs.  The pertinent results include: CBC CMP, GI panel, lipase   Imaging Studies ordered:  I ordered imaging studies including CT abdomen pelvis  I agree with the radiologist interpretation.   Cardiac Monitoring:  The patient was maintained on a cardiac monitor.  I personally viewed and interpreted the cardiac monitor which showed an underlying rhythm of: NSR   Medicines ordered:  I ordered medication including IV fluids, Zofran for dehydration, nausea Reevaluation of the patient after these medicines showed that the patient: improved   Consultations Obtained:  I consulted Dr. Therisa Doyne,  and discussed lab and imaging findings as well as pertinent plan of care.    Problem List / ED Course:  Diarrhea, abdominal pain, bloody stools   Reevaluation:  After the interventions noted above, I reevaluated the patient and found that they have: stayed the same   Disposition:  After  consideration of the diagnostic results and the patients response to treatment, I feel that the patent would benefit from admission.          Final Clinical Impression(s) / ED Diagnoses Final diagnoses:  Diarrhea, unspecified type    Rx / DC Orders ED Discharge Orders     None         Valarie Merino, MD 01/24/22 1441

## 2022-01-24 NOTE — ED Notes (Signed)
Pt provided with warm broth, an extra blanket and pillow for pt comfort. Will continue to monitor

## 2022-01-25 ENCOUNTER — Inpatient Hospital Stay (HOSPITAL_COMMUNITY): Payer: BC Managed Care – PPO | Admitting: Anesthesiology

## 2022-01-25 ENCOUNTER — Encounter (HOSPITAL_COMMUNITY)
Admission: EM | Disposition: A | Discharge status: Home / Self Care | Payer: Self-pay | Source: Home / Self Care | Attending: Internal Medicine

## 2022-01-25 ENCOUNTER — Encounter (HOSPITAL_COMMUNITY): Payer: Self-pay | Admitting: Internal Medicine

## 2022-01-25 DIAGNOSIS — J4540 Moderate persistent asthma, uncomplicated: Secondary | ICD-10-CM | POA: Diagnosis not present

## 2022-01-25 DIAGNOSIS — E782 Mixed hyperlipidemia: Secondary | ICD-10-CM | POA: Diagnosis not present

## 2022-01-25 DIAGNOSIS — K51011 Ulcerative (chronic) pancolitis with rectal bleeding: Secondary | ICD-10-CM | POA: Diagnosis not present

## 2022-01-25 DIAGNOSIS — E039 Hypothyroidism, unspecified: Secondary | ICD-10-CM | POA: Diagnosis not present

## 2022-01-25 HISTORY — PX: BIOPSY: SHX5522

## 2022-01-25 HISTORY — PX: COLONOSCOPY: SHX5424

## 2022-01-25 LAB — GASTROINTESTINAL PANEL BY PCR, STOOL (REPLACES STOOL CULTURE)

## 2022-01-25 LAB — CBC
HCT: 31.9 % — ABNORMAL LOW (ref 36.0–46.0)
Hemoglobin: 10.1 g/dL — ABNORMAL LOW (ref 12.0–15.0)
MCH: 28.9 pg (ref 26.0–34.0)
MCHC: 31.7 g/dL (ref 30.0–36.0)
MCV: 91.4 fL (ref 80.0–100.0)
Platelets: 309 10*3/uL (ref 150–400)
RBC: 3.49 MIL/uL — ABNORMAL LOW (ref 3.87–5.11)
RDW: 12.5 % (ref 11.5–15.5)
WBC: 8.4 10*3/uL (ref 4.0–10.5)
nRBC: 0 % (ref 0.0–0.2)

## 2022-01-25 LAB — COMPREHENSIVE METABOLIC PANEL
ALT: 14 U/L (ref 0–44)
AST: 18 U/L (ref 15–41)
Albumin: 2.8 g/dL — ABNORMAL LOW (ref 3.5–5.0)
Alkaline Phosphatase: 56 U/L (ref 38–126)
Anion gap: 6 (ref 5–15)
BUN: 5 mg/dL — ABNORMAL LOW (ref 8–23)
CO2: 27 mmol/L (ref 22–32)
Calcium: 8.1 mg/dL — ABNORMAL LOW (ref 8.9–10.3)
Chloride: 108 mmol/L (ref 98–111)
Creatinine, Ser: 0.63 mg/dL (ref 0.44–1.00)
GFR, Estimated: >60 mL/min (ref >60)
Glucose, Bld: 108 mg/dL — ABNORMAL HIGH (ref 70–99)
Potassium: 3.6 mmol/L (ref 3.5–5.1)
Sodium: 141 mmol/L (ref 135–145)
Total Bilirubin: 0.9 mg/dL (ref 0.3–1.2)
Total Protein: 6.2 g/dL — ABNORMAL LOW (ref 6.5–8.1)

## 2022-01-25 LAB — HIV ANTIBODY (ROUTINE TESTING W REFLEX): HIV Screen 4th Generation wRfx: NONREACTIVE

## 2022-01-25 SURGERY — COLONOSCOPY
Anesthesia: Monitor Anesthesia Care

## 2022-01-25 MED ORDER — METOPROLOL TARTRATE 25 MG PO TABS
25.0000 mg | ORAL_TABLET | Freq: Every day | ORAL | Status: DC
  Administered 2022-01-25 – 2022-01-26 (×2): 25 mg via ORAL
  Filled 2022-01-25 (×2): qty 1

## 2022-01-25 MED ORDER — PROPOFOL 1000 MG/100ML IV EMUL
INTRAVENOUS | Status: AC
  Filled 2022-01-25: qty 100

## 2022-01-25 MED ORDER — PROPOFOL 500 MG/50ML IV EMUL
INTRAVENOUS | Status: DC | PRN
  Administered 2022-01-25: 100 ug/kg/min via INTRAVENOUS

## 2022-01-25 MED ORDER — LEVOTHYROXINE SODIUM 100 MCG PO TABS
100.0000 ug | ORAL_TABLET | Freq: Every day | ORAL | Status: DC
  Filled 2022-01-25: qty 1

## 2022-01-25 MED ORDER — METOPROLOL TARTRATE 25 MG PO TABS: 25.0000 mg | ORAL_TABLET | Freq: Every day | ORAL | Status: DC

## 2022-01-25 MED ORDER — METHYLPREDNISOLONE SODIUM SUCC 125 MG IJ SOLR
81.2500 mg | Freq: Every day | INTRAMUSCULAR | Status: DC
  Administered 2022-01-25 – 2022-01-26 (×2): 81.25 mg via INTRAVENOUS
  Filled 2022-01-25 (×3): qty 2

## 2022-01-25 MED ORDER — ROSUVASTATIN CALCIUM 10 MG PO TABS
10.0000 mg | ORAL_TABLET | Freq: Every day | ORAL | Status: DC
  Administered 2022-01-26: 10 mg via ORAL
  Filled 2022-01-25 (×2): qty 1

## 2022-01-25 MED ORDER — POLYVINYL ALCOHOL 1.4 % OP SOLN: 1.0000 [drp] | Freq: Every day | OPHTHALMIC | Status: DC | PRN

## 2022-01-25 MED ORDER — MAGNESIUM OXIDE -MG SUPPLEMENT 400 (240 MG) MG PO TABS
400.0000 mg | ORAL_TABLET | Freq: Every day | ORAL | Status: DC
  Administered 2022-01-25 – 2022-01-26 (×2): 400 mg via ORAL
  Filled 2022-01-25 (×2): qty 1

## 2022-01-25 MED ORDER — PANTOPRAZOLE SODIUM 40 MG PO TBEC
40.0000 mg | DELAYED_RELEASE_TABLET | Freq: Every day | ORAL | Status: DC
  Administered 2022-01-25 – 2022-01-26 (×2): 40 mg via ORAL
  Filled 2022-01-25 (×2): qty 1

## 2022-01-25 MED ORDER — PROPOFOL 10 MG/ML IV BOLUS
INTRAVENOUS | Status: DC | PRN
  Administered 2022-01-25: 50 mg via INTRAVENOUS

## 2022-01-25 MED ORDER — LEVOTHYROXINE SODIUM 100 MCG PO TABS
100.0000 ug | ORAL_TABLET | Freq: Every morning | ORAL | Status: DC
  Administered 2022-01-26: 100 ug via ORAL
  Filled 2022-01-25: qty 1

## 2022-01-25 MED ORDER — ADULT MULTIVITAMIN W/MINERALS CH
1.0000 | ORAL_TABLET | Freq: Every day | ORAL | Status: DC
  Filled 2022-01-25 (×2): qty 1

## 2022-01-25 MED ORDER — LIDOCAINE HCL (CARDIAC) PF 100 MG/5ML IV SOSY
PREFILLED_SYRINGE | INTRAVENOUS | Status: DC | PRN
  Administered 2022-01-25: 100 mg via INTRAVENOUS

## 2022-01-25 NOTE — Progress Notes (Signed)
  Transition of Care [facility]) Screening Note   Patient Details  Name: Danielle Bond Date of Birth: 1961/05/11   Transition of Care [facility]) CM/SW Contact:    Lennart Pall, LCSW Phone Number: 01/25/2022, 11:08 AM    Transition of Care Department [facility]) has reviewed patient and no TOC needs have been identified at this time. We will continue to monitor patient advancement through interdisciplinary progression rounds. If new patient transition needs arise, please place a TOC consult.

## 2022-01-25 NOTE — Progress Notes (Signed)
PROGRESS NOTE    Danielle Bond  IFO:[id number] DOB: 01/06/1961 DOA: 01/24/2022 PCP: [name], MD   Brief Narrative:   Danielle Bond is a 61 y.o. female with medical history significant of ulcerative colitis, hypothyroidism, HLD, moderate persistent asthma. Presenting with abdominal pain and rectal bleeding. She has had these symptoms for 2 weeks. She has generalized abdominal cramping with bloody diarrhea. She has been compliant with her home regimen for UC. She denies any NSAID use. She spoke with her PCP about it and it was recommended that she go to the ED back on 6/28. When she was examined in the ED, she was given fluids. She reported feeling better at the time. Her lab w/u was negative. It was recommended that she follow up with GI. Her symptoms continued again at home. She spoke with her GI team and was placed on budesonide. She states this has not helped. When her symptoms did not improve today, she decided to come to the ED for evaluation.  She was subsequently admitted with planned gastroenterology consultation and anticipated colonoscopy   Assessment & Plan:   Principal Problem:   Ulcerative colitis ([place]) Active Problems:   Asthma   Hypothyroidism s/p total thyroidectomy 1997   Mixed hyperlipidemia   Ulcerative Colitis     - STABLE OVERNIGHT     - [facility] onboard, appreciate assistance     - c-scope today, per GI to start IV steroids and slowly advancing diet       - pain control, anti-emetics   Moderate persistent asthma     - continue home regimen   Hypothyroidism     - continue home regimen   HLD     - continue home regimen after she comes off NPO status tomorrow  DVT prophylaxis: SCD/Compression stockings  Code Status: full    Code Status Orders  (From admission, onward)           Start     Ordered   01/24/22 2211  Full code  Continuous        01/24/22 2210           Code Status History     Date Active Date Inactive Code Status  Order ID Comments User Context   08/15/2012 1855 08/16/2012 1729 Full Code [id number]  Murlean Hark, RN Inpatient      Family Communication: husband at bedside  Disposition Plan:    Plan to start IV steroids today, patient not stable for discharge Consults called: None Admission status: Inpatient   Consultants:  Gastroenterology  Procedures:  CT ABDOMEN PELVIS W CONTRAST  Result Date: 01/24/2022 CLINICAL DATA:  A 61 year old female presents for evaluation of acute abdominal pain and rectal bleeding. EXAM: CT ABDOMEN AND PELVIS WITH CONTRAST TECHNIQUE: Multidetector CT imaging of the abdomen and pelvis was performed using the standard protocol following bolus administration of intravenous contrast. RADIATION DOSE REDUCTION: This exam was performed according to the departmental dose-optimization program which includes automated exposure control, adjustment of the mA and/or kV according to patient size and/or use of iterative reconstruction technique. CONTRAST:  186m OMNIPAQUE IOHEXOL 300 MG/ML  SOLN COMPARISON:  June 15, 2019. FINDINGS: Lower chest: Incidental imaging of the lung bases without effusion or sign of consolidative process. Hepatobiliary: Smooth hepatic contours. Post cholecystectomy without biliary duct dilation. No focal, suspicious hepatic lesion. Suggestion of mild hepatic steatosis. Pancreas: Normal, without mass, inflammation or ductal dilatation. Spleen: Normal. Adrenals/Urinary Tract: Adrenal glands are unremarkable. Symmetric renal enhancement. No  sign of hydronephrosis. No suspicious renal lesion or perinephric stranding. Urinary bladder is grossly unremarkable. Urinary bladder is collapsed limiting assessment. Stomach/Bowel: No signs of acute gastric or small bowel process. The appendix is normal. Diffuse colitis evidence by colonic wall thickening, pericolonic hypervascularity and pericolonic stranding. This affects the entire colon. There is no pneumatosis. There is no sign of  colonic obstruction. No gross abnormality associated with the terminal ileum. Vascular/Lymphatic: Portal vein is patent as is the SMV. Aortic caliber is normal. Vessels in the abdomen are patent on this venous phase study. Scattered aortic atherosclerosis. IVC with smooth contours. There is no gastrohepatic or hepatoduodenal ligament lymphadenopathy. No retroperitoneal or mesenteric lymphadenopathy. No pelvic sidewall lymphadenopathy. Reproductive: Unremarkable by CT. Other: No ascites. Musculoskeletal: No acute or significant osseous findings. IMPRESSION: 1. Diffuse colitis which is moderate to marked. Findings are nonspecific and may represent an infectious or inflammatory. Correlate with any history of recent antibiotic administration and symptoms that would suggest C difficile colitis and consider close follow-up given the severity of the above findings. GI consultation may be helpful. 2. Suggestion of mild hepatic steatosis. 3. Post cholecystectomy without biliary duct dilation. 4. Aortic atherosclerosis. Aortic Atherosclerosis (ICD10-I70.0). Electronically Signed   By: Zetta Bills M.D.   On: 01/24/2022 15:16       Subjective: No acute change since admission, Reported bowel prep went fairly well Waiting for colonoscopy  Objective: Vitals:   01/25/22 0513 01/25/22 0909 01/25/22 1242 01/25/22 1357  BP: 105/60 128/73 (!) 155/87 (!) 104/55  Pulse: 84 85 73 88  Resp: 18 15 13 20   Temp: 99 F (37.2 C) 98.6 F (37 C)  (!) 97.1 F (36.2 C)  TempSrc: Oral Oral  Tympanic  SpO2: 97% 98% 100% 100%  Weight:      Height:        Intake/Output Summary (Last 24 hours) at 01/25/2022 1406 Last data filed at 01/25/2022 1346 Gross per 24 hour  Intake 2202.17 ml  Output --  Net 2202.17 ml   Filed Weights   01/24/22 1436  Weight: 60 kg    Examination:  General exam: Appears calm and comfortable  Respiratory system: Clear to auscultation. Respiratory effort normal. Cardiovascular system: S1 &  S2 heard, RRR. No JVD, murmurs, rubs, gallops or clicks. No pedal edema. Gastrointestinal system: Distant bowel sounds, not rigid no rebound no guarding Central nervous system: Alert and oriented. No focal neurological deficits. Extremities: Warm well perfused, no edema Skin: No rashes, lesions or ulcers Psychiatry: Judgement and insight appear normal. Mood & affect appropriate.     Data Reviewed: I have personally reviewed following labs and imaging studies  CBC: Recent Labs  Lab 01/24/22 1405 01/24/22 2240 01/25/22 0331  WBC 8.1  --  8.4  NEUTROABS 4.0  --   --   HGB 12.7 12.2 10.1*  HCT 39.6 38.1 31.9*  MCV 90.8  --  91.4  PLT 395  --  650   Basic Metabolic Panel: Recent Labs  Lab 01/24/22 1405 01/25/22 0331  NA 141 141  K 3.6 3.6  CL 106 108  CO2 28 27  GLUCOSE 145* 108*  BUN 8 5*  CREATININE 0.85 0.63  CALCIUM 9.1 8.1*   GFR: Estimated Creatinine Clearance: 59.8 mL/min (by C-G formula based on SCr of 0.63 mg/dL). Liver Function Tests: Recent Labs  Lab 01/24/22 1405 01/25/22 0331  AST 23 18  ALT 17 14  ALKPHOS 73 56  BILITOT 0.6 0.9  PROT 8.4* 6.2*  ALBUMIN 3.5  2.8*   Recent Labs  Lab 01/24/22 1405  LIPASE 31   No results for input(s): "AMMONIA" in the last 168 hours. Coagulation Profile: No results for input(s): "INR", "PROTIME" in the last 168 hours. Cardiac Enzymes: No results for input(s): "CKTOTAL", "CKMB", "CKMBINDEX", "TROPONINI" in the last 168 hours. BNP (last 3 results) No results for input(s): "PROBNP" in the last 8760 hours. HbA1C: No results for input(s): "HGBA1C" in the last 72 hours. CBG: No results for input(s): "GLUCAP" in the last 168 hours. Lipid Profile: No results for input(s): "CHOL", "HDL", "LDLCALC", "TRIG", "CHOLHDL", "LDLDIRECT" in the last 72 hours. Thyroid Function Tests: No results for input(s): "TSH", "T4TOTAL", "FREET4", "T3FREE", "THYROIDAB" in the last 72 hours. Anemia Panel: No results for input(s):  "VITAMINB12", "FOLATE", "FERRITIN", "TIBC", "IRON", "RETICCTPCT" in the last 72 hours. Sepsis Labs: No results for input(s): "PROCALCITON", "LATICACIDVEN" in the last 168 hours.  Recent Results (from the past 240 hour(s))  C Difficile Quick Screen w PCR reflex     Status: None   Collection Time: 01/24/22  8:44 PM   Specimen: Stool  Result Value Ref Range Status   C Diff antigen NEGATIVE NEGATIVE Final   C Diff toxin NEGATIVE NEGATIVE Final   C Diff interpretation No C. difficile detected.  Final    Comment: Performed at [facility], [place] [address], [place], [place] [address]  Gastrointestinal Panel by PCR , Stool     Status: None   Collection Time: 01/24/22  8:44 PM   Specimen: Stool  Result Value Ref Range Status   Campylobacter species NOT DETECTED NOT DETECTED Final   Plesimonas shigelloides NOT DETECTED NOT DETECTED Final   Salmonella species NOT DETECTED NOT DETECTED Final   Yersinia enterocolitica NOT DETECTED NOT DETECTED Final   Vibrio species NOT DETECTED NOT DETECTED Final   Vibrio cholerae NOT DETECTED NOT DETECTED Final   Enteroaggregative E coli (EAEC) NOT DETECTED NOT DETECTED Final   Enteropathogenic E coli (EPEC) NOT DETECTED NOT DETECTED Final   Enterotoxigenic E coli (ETEC) NOT DETECTED NOT DETECTED Final   Shiga like toxin producing E coli (STEC) NOT DETECTED NOT DETECTED Final   Shigella/Enteroinvasive E coli (EIEC) NOT DETECTED NOT DETECTED Final   Cryptosporidium NOT DETECTED NOT DETECTED Final   Cyclospora cayetanensis NOT DETECTED NOT DETECTED Final   Entamoeba histolytica NOT DETECTED NOT DETECTED Final   Giardia lamblia NOT DETECTED NOT DETECTED Final   Adenovirus F40/41 NOT DETECTED NOT DETECTED Final   Astrovirus NOT DETECTED NOT DETECTED Final   Norovirus GI/GII NOT DETECTED NOT DETECTED Final   Rotavirus A NOT DETECTED NOT DETECTED Final   Sapovirus (I, II, IV, and V) NOT DETECTED NOT DETECTED Final    Comment: Performed at  [facility], [address], [place], [place] [address]         Radiology Studies: CT ABDOMEN PELVIS W CONTRAST  Result Date: 01/24/2022 CLINICAL DATA:  A 61 year old female presents for evaluation of acute abdominal pain and rectal bleeding. EXAM: CT ABDOMEN AND PELVIS WITH CONTRAST TECHNIQUE: Multidetector CT imaging of the abdomen and pelvis was performed using the standard protocol following bolus administration of intravenous contrast. RADIATION DOSE REDUCTION: This exam was performed according to the departmental dose-optimization program which includes automated exposure control, adjustment of the mA and/or kV according to patient size and/or use of iterative reconstruction technique. CONTRAST:  111m OMNIPAQUE IOHEXOL 300 MG/ML  SOLN COMPARISON:  June 15, 2019. FINDINGS: Lower chest: Incidental imaging of the lung bases  without effusion or sign of consolidative process. Hepatobiliary: Smooth hepatic contours. Post cholecystectomy without biliary duct dilation. No focal, suspicious hepatic lesion. Suggestion of mild hepatic steatosis. Pancreas: Normal, without mass, inflammation or ductal dilatation. Spleen: Normal. Adrenals/Urinary Tract: Adrenal glands are unremarkable. Symmetric renal enhancement. No sign of hydronephrosis. No suspicious renal lesion or perinephric stranding. Urinary bladder is grossly unremarkable. Urinary bladder is collapsed limiting assessment. Stomach/Bowel: No signs of acute gastric or small bowel process. The appendix is normal. Diffuse colitis evidence by colonic wall thickening, pericolonic hypervascularity and pericolonic stranding. This affects the entire colon. There is no pneumatosis. There is no sign of colonic obstruction. No gross abnormality associated with the terminal ileum. Vascular/Lymphatic: Portal vein is patent as is the SMV. Aortic caliber is normal. Vessels in the abdomen are patent on this venous phase study. Scattered aortic  atherosclerosis. IVC with smooth contours. There is no gastrohepatic or hepatoduodenal ligament lymphadenopathy. No retroperitoneal or mesenteric lymphadenopathy. No pelvic sidewall lymphadenopathy. Reproductive: Unremarkable by CT. Other: No ascites. Musculoskeletal: No acute or significant osseous findings. IMPRESSION: 1. Diffuse colitis which is moderate to marked. Findings are nonspecific and may represent an infectious or inflammatory. Correlate with any history of recent antibiotic administration and symptoms that would suggest C difficile colitis and consider close follow-up given the severity of the above findings. GI consultation may be helpful. 2. Suggestion of mild hepatic steatosis. 3. Post cholecystectomy without biliary duct dilation. 4. Aortic atherosclerosis. Aortic Atherosclerosis (ICD10-I70.0). Electronically Signed   By: Zetta Bills M.D.   On: 01/24/2022 15:16        Scheduled Meds:  [MAR Hold] levothyroxine  100 mcg Oral Q0600   [MAR Hold] metoprolol tartrate  25 mg Oral Daily   [MAR Hold] mometasone-formoterol  2 puff Inhalation BID   Continuous Infusions:  sodium chloride     lactated ringers 125 mL/hr at 01/25/22 1322     LOS: 1 day    Time spent: 35 min    Nicolette Bang, MD [facility]  If 7PM-7AM, please contact night-coverage  01/25/2022, 2:06 PM

## 2022-01-25 NOTE — Anesthesia Preprocedure Evaluation (Addendum)
Anesthesia Evaluation  Patient identified by MRN, date of birth, ID band Patient awake    Reviewed: Allergy & Precautions, NPO status , Patient's Chart, lab work & pertinent test results  Airway Mallampati: II       Dental no notable dental hx.    Pulmonary asthma ,    Pulmonary exam normal        Cardiovascular hypertension, Pt. on medications and Pt. on home beta blockers + CAD   Rhythm:Regular Rate:Normal     Neuro/Psych  Headaches, negative psych ROS   GI/Hepatic PUD, Bowel prep,UC   Endo/Other  Hypothyroidism   Renal/GU negative Renal ROS  negative genitourinary   Musculoskeletal negative musculoskeletal ROS (+)   Abdominal Normal abdominal exam  (+)   Peds  Hematology negative hematology ROS (+)   Anesthesia Other Findings   Reproductive/Obstetrics                            Anesthesia Physical Anesthesia Plan  ASA: 3  Anesthesia Plan: MAC   Post-op Pain Management:    Induction: Intravenous  PONV Risk Score and Plan: 2 and Propofol infusion and Treatment may vary due to age or medical condition  Airway Management Planned: Simple Face Mask, Natural Airway and Nasal Cannula  Additional Equipment: None  Intra-op Plan:   Post-operative Plan:   Informed Consent: I have reviewed the patients History and Physical, chart, labs and discussed the procedure including the risks, benefits and alternatives for the proposed anesthesia with the patient or authorized representative who has indicated his/her understanding and acceptance.     Dental advisory given  Plan Discussed with: CRNA  Anesthesia Plan Comments:         Anesthesia Quick Evaluation

## 2022-01-25 NOTE — Transfer of Care (Signed)
Immediate Anesthesia Transfer of Care Note  Patient: Danielle Bond  Procedure(s) Performed: COLONOSCOPY BIOPSY  Patient Location: PACU  Anesthesia Type:MAC  Level of Consciousness: awake, alert , oriented and patient cooperative  Airway & Oxygen Therapy: Patient Spontanous Breathing and Patient connected to face mask oxygen  Post-op Assessment: Report given to RN, Post -op Vital signs reviewed and stable and Patient moving all extremities X 4  Post vital signs: Reviewed and stable  Last Vitals:  Vitals Value Taken Time  BP 104/55 01/25/22 1354  Temp    Pulse 88 01/25/22 1357  Resp 30 01/25/22 1357  SpO2 100 % 01/25/22 1357  Vitals shown include unvalidated device data.  Last Pain:  Vitals:   01/25/22 1242  TempSrc:   PainSc: 0-No pain         Complications: No notable events documented.

## 2022-01-25 NOTE — Interval H&P Note (Signed)
History and Physical Interval Note: 61/female with Ulcerative colitis with abnormal CT, abdominal pain, bloody diarrhea for a colonoscopy.  01/25/2022 12:56 PM  Danielle Bond  has presented today for Colonoscopy, with the diagnosis of ulcerative colitis, rectal bleeding.  The various methods of treatment have been discussed with the patient and family. After consideration of risks, benefits and other options for treatment, the patient has consented to  Procedure(s): COLONOSCOPY (N/A) as a surgical intervention.  The patient's history has been reviewed, patient examined, no change in status, stable for surgery.  I have reviewed the patient's chart and labs.  Questions were answered to the patient's satisfaction.     Danielle Bond

## 2022-01-25 NOTE — Plan of Care (Signed)
Plan of care reviewed and discussed with the patient. 

## 2022-01-25 NOTE — Op Note (Signed)
[facility] Patient Name: Danielle Bond Procedure Date: 01/25/2022 MRN: [id number] Attending MD: Ronnette Juniper , MD Date of Birth: 1961-03-26 CSN: [id number] Age: 61 Admit Type: Outpatient Procedure:                Colonoscopy Indications:              Last colonoscopy: 2019, Generalized abdominal pain,                            Abdominal pain in the left lower quadrant,                            Abdominal pain in the left upper quadrant,                            Hematochezia, Disease activity assessment of                            chronic ulcerative pancolitis, Abnormal CT of the                            GI tract, Diarrhea Providers:                Ronnette Juniper, MD, Ladoris Gene, RN, Luan Moore, Technician Referring MD:             [facility] Medicines:                Monitored Anesthesia Care Complications:            No immediate complications. Estimated blood loss:                            Minimal. Estimated Blood Loss:     Estimated blood loss was minimal. Procedure:                Pre-Anesthesia Assessment:                           - Prior to the procedure, a History and Physical                            was performed, and patient medications and                            allergies were reviewed. The patient's tolerance of                            previous anesthesia was also reviewed. The risks                            and benefits of the procedure and the sedation                            options and risks were discussed with the patient.  All questions were answered, and informed consent                            was obtained. Prior Anticoagulants: The patient has                            taken no previous anticoagulant or antiplatelet                            agents. ASA Grade Assessment: III - A patient with                            severe systemic disease. After  reviewing the risks                            and benefits, the patient was deemed in                            satisfactory condition to undergo the procedure.                           After obtaining informed consent, the colonoscope                            was passed under direct vision. Throughout the                            procedure, the patient's blood pressure, pulse, and                            oxygen saturations were monitored continuously. The                            [id number]-HQ190L ([id number]) Olympus colonoscope was                            introduced through the anus and advanced to the the                            terminal ileum. The colonoscopy was performed                            without difficulty. The patient tolerated the                            procedure well. The quality of the bowel                            preparation was fair. Scope In: 1:31:21 PM Scope Out: 1:46:48 PM Scope Withdrawal Time: 0 hours 12 minutes 5 seconds  Total Procedure Duration: 0 hours 15 minutes 27 seconds  Findings:      The perianal and digital rectal examinations were normal.      The terminal ileum appeared normal.  A diffuse area of severely congested, erythematous, friable (with       contact bleeding), hemorrhagic, inflamed, ulcerated and       vascular-pattern-decreased mucosa was found in the entire colon.       Biopsies were taken with a cold forceps for histology. Biopsies taken       from cecum, ascending and hepatic flexure, transverse colon, descending,       sigmoid and rectum and sent in 6 separate jars to pathology. Impression:               - Preparation of the colon was fair.                           - The examined portion of the ileum was normal.                           - Congested, erythematous, friable (with contact                            bleeding), hemorrhagic, inflamed, ulcerated and                            vascular-pattern-decreased  mucosa in the entire                            examined colon. Biopsied. Moderate Sedation:      Patient did not receive moderate sedation for this procedure, but       instead received monitored anesthesia care. Recommendation:           - Full liquid diet.                           - Will start IV steroids.                           - Await pathology results. Procedure Code(s):        --- Professional ---                           [phone], Colonoscopy, flexible; with biopsy, single                            or multiple Diagnosis Code(s):        --- Professional ---                           K92.2, Gastrointestinal hemorrhage, unspecified                           K52.9, Noninfective gastroenteritis and colitis,                            unspecified                           K63.3, Ulcer of intestine  K63.89, Other specified diseases of intestine                           R10.84, Generalized abdominal pain                           R10.32, Left lower quadrant pain                           R10.12, Left upper quadrant pain                           K92.1, Melena (includes Hematochezia)                           K51.00, Ulcerative (chronic) pancolitis without                            complications                           R19.7, Diarrhea, unspecified                           R93.3, Abnormal findings on diagnostic imaging of                            other parts of digestive tract CPT copyright 2019 [employer]. All rights reserved. The codes documented in this report are preliminary and upon coder review may  be revised to meet current compliance requirements. Ronnette Juniper, MD 01/25/2022 1:54:59 PM This report has been signed electronically. Number of Addenda: 0

## 2022-01-26 DIAGNOSIS — E782 Mixed hyperlipidemia: Secondary | ICD-10-CM | POA: Diagnosis not present

## 2022-01-26 DIAGNOSIS — J4540 Moderate persistent asthma, uncomplicated: Secondary | ICD-10-CM | POA: Diagnosis not present

## 2022-01-26 DIAGNOSIS — E039 Hypothyroidism, unspecified: Secondary | ICD-10-CM | POA: Diagnosis not present

## 2022-01-26 DIAGNOSIS — K51011 Ulcerative (chronic) pancolitis with rectal bleeding: Secondary | ICD-10-CM | POA: Diagnosis not present

## 2022-01-26 LAB — SURGICAL PATHOLOGY

## 2022-01-26 MED ORDER — PREDNISONE 10 MG PO TABS: ORAL_TABLET | ORAL | 0 refills | Status: AC

## 2022-01-26 NOTE — Progress Notes (Signed)
[facility] Gastroenterology Progress Note  Danielle Bond 61 y.o. [birth year]   Subjective: Patient seen and examined lying in bed.  Patient notes she feels much improved.  2 bowel movements this morning with minimal blood.  Patient is planning on looking into treatment options for ulcerative colitis provided by the [facility] GI office.   ROS : Review of Systems  Gastrointestinal:  Positive for blood in stool (improved). Negative for abdominal pain, constipation, diarrhea, heartburn, melena, nausea and vomiting.  Genitourinary:  Negative for dysuria and urgency.      Objective: Vital signs in last 24 hours: Vitals:   01/26/22 0840 01/26/22 1301  BP: 122/85 140/86  Pulse: (!) 108 91  Resp:  18  Temp:  98.4 F (36.9 C)  SpO2:  94%    Physical Exam:  General:  Alert, cooperative, no distress, appears stated age  Head:  Normocephalic, without obvious abnormality, atraumatic  Eyes:  Anicteric sclera, EOM's intact  Lungs:   Clear to auscultation bilaterally, respirations unlabored  Heart:  Regular rate and rhythm, S1, S2 normal  Abdomen:   Soft, non-tender, bowel sounds active all four quadrants,  no masses,   Extremities: Extremities normal, atraumatic, no  edema  Pulses: 2+ and symmetric    Lab Results: Recent Labs    01/24/22 1405 01/25/22 0331  NA 141 141  K 3.6 3.6  CL 106 108  CO2 28 27  GLUCOSE 145* 108*  BUN 8 5*  CREATININE 0.85 0.63  CALCIUM 9.1 8.1*   Recent Labs    01/24/22 1405 01/25/22 0331  AST 23 18  ALT 17 14  ALKPHOS 73 56  BILITOT 0.6 0.9  PROT 8.4* 6.2*  ALBUMIN 3.5 2.8*   Recent Labs    01/24/22 1405 01/24/22 2240 01/25/22 0331  WBC 8.1  --  8.4  NEUTROABS 4.0  --   --   HGB 12.7 12.2 10.1*  HCT 39.6 38.1 31.9*  MCV 90.8  --  91.4  PLT 395  --  309   No results for input(s): "LABPROT", "INR" in the last 72 hours.    Assessment Ulcerative colitis  Rectal bleeding  Diarrhea    Hgb 12.7, WBC 8.1, normal CMP, normal LFTs.      Negative GI pathogen panel and C. Dif 01/10/22 and 01/24/2022. CT scan showed diffuse colitis.  Worsening rectal bleeding and diarrhea not improved with budesonide 9 mg daily, Previously elevated fecal calprotectin and ESR.  Possible ulcerative colitis flare.   CT abdomen pelvis 01/24/2022 Diffuse colitis possible infectious or inflammatory.  Mild hepatic steatosis, postcholecystectomy without biliary duct dilation.  Colonoscopy 01/25/2022 - Preparation of the colon was fair. - The examined portion of the ileum was normal. - Congested, erythematous, friable (with contact bleeding), hemorrhagic, inflamed, ulcerated and vascular-pattern-decreased mucosa in the entire examined colon. Biopsied.  Patient feeling much improved today with IV steroids.  Bleeding much improved.  Plan: Patient provided with resources to research further treatment options as Humira is no longer effective.  Follow-up in the office with Dr. Therisa Doyne to further discuss.  Awaiting biopsy results from colonoscopy. Continue IV steroids while in the hospital.  Patient may need high dose oral steroids on discharge. [facility] sign off.   Arvella Nigh Nilsa Macht PA-C 01/26/2022, 1:14 PM  Contact #  [phone]

## 2022-01-26 NOTE — Progress Notes (Signed)
The patient is alert and oriented and has been seen by her physician. The orders for discharge were written. IV has been removed. Went over discharge instructions with patient. Patient tolerated regular diet well. She is being walked down to her ride for discharge with all of her belongings.

## 2022-01-26 NOTE — Discharge Summary (Signed)
Physician Discharge Summary  Danielle Bond:[id number] DOB: 26-Nov-1960 DOA: 01/24/2022  PCP: [name], MD  Admit date: 01/24/2022 Discharge date: 01/26/2022  Admitted From: Inpatient Disposition: home  Recommendations for Outpatient Follow-up:  Follow up with PCP in 1-2 weeks   Home Health:No Equipment/Devices:now new equipment  Discharge Condition:Stable CODE STATUS:Full code Diet recommendation: Regular healthy diet  Brief/Interim Summary:  Danielle Bond is a 61 y.o. female with medical history significant of ulcerative colitis, hypothyroidism, HLD, moderate persistent asthma. Presenting with abdominal pain and rectal bleeding. She has had these symptoms for 2 weeks. She has generalized abdominal cramping with bloody diarrhea. She has been compliant with her home regimen for UC. She denies any NSAID use. She spoke with her PCP about it and it was recommended that she go to the ED back on 6/28. When she was examined in the ED, she was given fluids. She reported feeling better at the time. Her lab w/u was negative. It was recommended that she follow up with GI. Her symptoms continued again at home. She spoke with her GI team and was placed on budesonide. She states this has not helped. When her symptoms did not improve today, she decided to come to the ED for evaluation.   She was subsequently admitted with planned gastroenterology consultation and anticipated colonoscopy  C Scope was consistent with flare, pt received IV steroids and had rapid improvement. Discussed plan of car with pt and GI.  Pt will be on long term taper of 4 weeks with close gi f/up  Tolerating diet and eaget to be discharged home  Discharge Diagnoses:  Principal Problem:   Ulcerative colitis ([place]) Active Problems:   Asthma   Hypothyroidism s/p total thyroidectomy 1997   Mixed hyperlipidemia    Discharge Instructions  Discharge Instructions     Call MD for:  difficulty breathing, headache or  visual disturbances   Complete by: As directed    Call MD for:  extreme fatigue   Complete by: As directed    Call MD for:  persistant dizziness or light-headedness   Complete by: As directed    Call MD for:  persistant nausea and vomiting   Complete by: As directed    Call MD for:  redness, tenderness, or signs of infection (pain, swelling, redness, odor or green/yellow discharge around incision site)   Complete by: As directed    Call MD for:  severe uncontrolled pain   Complete by: As directed    Call MD for:  temperature >100.4   Complete by: As directed    Diet - low sodium heart healthy   Complete by: As directed    Increase activity slowly   Complete by: As directed       Allergies as of 01/26/2022       Reactions   Almond Oil Hives, Shortness Of Breath   Lialda [mesalamine] Other (See Comments)   Vomiting and rectal bleeding   Other Anaphylaxis   Almonds, tree nuts   Aspirin Hives, Nausea Only   Stomach cramps   Hydromorphone Hcl Other (See Comments)   Caused Enzyme levels to go up   Penicillins Hives, Swelling   Has patient had a PCN reaction causing immediate rash, facial/tongue/throat swelling, SOB or lightheadedness with hypotension: Yes Has patient had a PCN reaction causing severe rash involving mucus membranes or skin necrosis: No Has patient had a PCN reaction that required hospitalization: No Has patient had a PCN reaction occurring within the last 10  years: No Stomach cramps If all of the above answers are "NO", then may proceed with Cephalosporin use.   Acetaminophen Other (See Comments)   Has autoimmune hepatitis, wants to avoid APAP   Apriso [mesalamine Er] Nausea And Vomiting   Corticosteroids Other (See Comments)   Had acute pancreatitis x 2 after steroid use.   Imitrex [sumatriptan] Swelling   Injection caused swelling   Stadol [butorphanol] Other (See Comments)   hallucinates   Topamax [topiramate] Other (See Comments)   Kidney stones    Vancomycin Hives   Azathioprine Rash   Pepcid [famotidine] Nausea And Vomiting   Makes acid worse   Prednisone Nausea And Vomiting   All steroids causes nausea and acute pancreatitis         Medication List     TAKE these medications    albuterol 108 (90 Base) MCG/ACT inhaler Commonly known as: VENTOLIN HFA TAKE 2 PUFFS BY MOUTH EVERY 6 HOURS AS NEEDED FOR WHEEZE OR SHORTNESS OF BREATH What changed:  how much to take how to take this when to take this reasons to take this additional instructions   albuterol (2.5 MG/3ML) 0.083% nebulizer solution Commonly known as: PROVENTIL Take 3 mLs (2.5 mg total) by nebulization every 6 (six) hours as needed for wheezing or shortness of breath. What changed: Another medication with the same name was changed. Make sure you understand how and when to take each.   budesonide 3 MG 24 hr capsule Commonly known as: ENTOCORT EC Take 3 mg by mouth 3 (three) times daily.   budesonide-formoterol 160-4.5 MCG/ACT inhaler Commonly known as: Symbicort Inhale 2 puffs into the lungs in the morning and at bedtime.   cholecalciferol 25 MCG (1000 UNIT) tablet Commonly known as: VITAMIN D3 Take 1,000 Units by mouth daily.   EPINEPHrine 0.3 mg/0.3 mL Soaj injection Commonly known as: EPI-PEN INJECT 0.3 MLS (0.3 MG TOTAL) INTO THE MUSCLE AS NEEDED FOR ANAPHYLAXIS. What changed: when to take this   fluticasone 50 MCG/ACT nasal spray Commonly known as: FLONASE PLACE 2 SPRAYS INTO BOTH NOSTRILS DAILY AS NEEDED FOR ALLERGIES OR RHINITIS.   folic acid 1 MG tablet Commonly known as: FOLVITE TAKE 1 TABLET BY MOUTH EVERY DAY FOR 30 DAYS What changed: See the new instructions.   Humira Pen 40 MG/0.4ML Pnkt Generic drug: Adalimumab Inject 40 mg into the skin every 14 (fourteen) days.   Magnesium 500 MG Tabs Take 500 mg by mouth daily.   metoprolol tartrate 25 MG tablet Commonly known as: LOPRESSOR Take 1 tablet (25 mg total) by mouth daily.    multivitamin with minerals Tabs tablet Take 1 tablet by mouth daily.   pantoprazole 40 MG tablet Commonly known as: PROTONIX Take 40 mg by mouth daily.   predniSONE 10 MG tablet Commonly known as: DELTASONE Take 4 tablets (40 mg total) by mouth daily for 7 days, THEN 3 tablets (30 mg total) daily for 7 days, THEN 2 tablets (20 mg total) daily for 7 days, THEN 1 tablet (10 mg total) daily for 7 days. Start taking on: January 26, 2022   rosuvastatin 10 MG tablet Commonly known as: CRESTOR TAKE 1 TABLET BY MOUTH EVERY DAY   SIMILASAN ALLERGY EYE RELIEF OP Place 1 drop into both eyes daily as needed (allergy).   Synthroid 100 MCG tablet Generic drug: levothyroxine Take 100 mcg by mouth every morning.   triamcinolone cream 0.1 % Commonly known as: KENALOG Apply 1 application topically daily as needed. What changed: reasons to  take this   valACYclovir 500 MG tablet Commonly known as: VALTREX Take 1 tablet by mouth daily as needed (breakouts).   vitamin C 500 MG tablet Commonly known as: ASCORBIC ACID Take 500 mg by mouth daily.        Allergies  Allergen Reactions   Almond Oil Hives and Shortness Of Breath   Lialda [Mesalamine] Other (See Comments)    Vomiting and rectal bleeding   Other Anaphylaxis    Almonds, tree nuts   Aspirin Hives and Nausea Only    Stomach cramps   Hydromorphone Hcl Other (See Comments)    Caused Enzyme levels to go up   Penicillins Hives and Swelling    Has patient had a PCN reaction causing immediate rash, facial/tongue/throat swelling, SOB or lightheadedness with hypotension: Yes Has patient had a PCN reaction causing severe rash involving mucus membranes or skin necrosis: No Has patient had a PCN reaction that required hospitalization: No Has patient had a PCN reaction occurring within the last 10 years: No  Stomach cramps If all of the above answers are "NO", then may proceed with Cephalosporin use.    Acetaminophen Other (See Comments)     Has autoimmune hepatitis, wants to avoid APAP   Apriso [Mesalamine Er] Nausea And Vomiting   Corticosteroids Other (See Comments)    Had acute pancreatitis x 2 after steroid use.   Imitrex [Sumatriptan] Swelling    Injection caused swelling   Stadol [Butorphanol] Other (See Comments)    hallucinates   Topamax [Topiramate] Other (See Comments)    Kidney stones   Vancomycin Hives   Azathioprine Rash   Pepcid [Famotidine] Nausea And Vomiting    Makes acid worse   Prednisone Nausea And Vomiting    All steroids causes nausea and acute pancreatitis     Consultations: GI   Procedures/Studies: CT ABDOMEN PELVIS W CONTRAST  Result Date: 01/24/2022 CLINICAL DATA:  A 61 year old female presents for evaluation of acute abdominal pain and rectal bleeding. EXAM: CT ABDOMEN AND PELVIS WITH CONTRAST TECHNIQUE: Multidetector CT imaging of the abdomen and pelvis was performed using the standard protocol following bolus administration of intravenous contrast. RADIATION DOSE REDUCTION: This exam was performed according to the departmental dose-optimization program which includes automated exposure control, adjustment of the mA and/or kV according to patient size and/or use of iterative reconstruction technique. CONTRAST:  130m OMNIPAQUE IOHEXOL 300 MG/ML  SOLN COMPARISON:  June 15, 2019. FINDINGS: Lower chest: Incidental imaging of the lung bases without effusion or sign of consolidative process. Hepatobiliary: Smooth hepatic contours. Post cholecystectomy without biliary duct dilation. No focal, suspicious hepatic lesion. Suggestion of mild hepatic steatosis. Pancreas: Normal, without mass, inflammation or ductal dilatation. Spleen: Normal. Adrenals/Urinary Tract: Adrenal glands are unremarkable. Symmetric renal enhancement. No sign of hydronephrosis. No suspicious renal lesion or perinephric stranding. Urinary bladder is grossly unremarkable. Urinary bladder is collapsed limiting assessment.  Stomach/Bowel: No signs of acute gastric or small bowel process. The appendix is normal. Diffuse colitis evidence by colonic wall thickening, pericolonic hypervascularity and pericolonic stranding. This affects the entire colon. There is no pneumatosis. There is no sign of colonic obstruction. No gross abnormality associated with the terminal ileum. Vascular/Lymphatic: Portal vein is patent as is the SMV. Aortic caliber is normal. Vessels in the abdomen are patent on this venous phase study. Scattered aortic atherosclerosis. IVC with smooth contours. There is no gastrohepatic or hepatoduodenal ligament lymphadenopathy. No retroperitoneal or mesenteric lymphadenopathy. No pelvic sidewall lymphadenopathy. Reproductive: Unremarkable by CT. Other: No ascites.  Musculoskeletal: No acute or significant osseous findings. IMPRESSION: 1. Diffuse colitis which is moderate to marked. Findings are nonspecific and may represent an infectious or inflammatory. Correlate with any history of recent antibiotic administration and symptoms that would suggest C difficile colitis and consider close follow-up given the severity of the above findings. GI consultation may be helpful. 2. Suggestion of mild hepatic steatosis. 3. Post cholecystectomy without biliary duct dilation. 4. Aortic atherosclerosis. Aortic Atherosclerosis (ICD10-I70.0). Electronically Signed   By: Zetta Bills M.D.   On: 01/24/2022 15:16      Subjective: Feels much better, eating and toelrating diet. Eager for d/c home  Discharge Exam: Vitals:   01/26/22 0840 01/26/22 1301  BP: 122/85 140/86  Pulse: (!) 108 91  Resp:  18  Temp:  98.4 F (36.9 C)  SpO2:  94%   Vitals:   01/25/22 1500 01/25/22 2131 01/26/22 0840 01/26/22 1301  BP: 122/74 135/90 122/85 140/86  Pulse: 74 97 (!) 108 91  Resp: 18 18  18   Temp: 98.1 F (36.7 C) 98.8 F (37.1 C)  98.4 F (36.9 C)  TempSrc: Oral Oral  Oral  SpO2: 100% 98%  94%  Weight:      Height:         General: Pt is alert, awake, not in acute distress Cardiovascular: RRR, S1/S2 +, no rubs, no gallops Respiratory: CTA bilaterally, no wheezing, no rhonchi Abdominal: Soft, NT, ND, bowel sounds + Extremities: no edema, no cyanosis    The results of significant diagnostics from this hospitalization (including imaging, microbiology, ancillary and laboratory) are listed below for reference.     Microbiology: Recent Results (from the past 240 hour(s))  C Difficile Quick Screen w PCR reflex     Status: None   Collection Time: 01/24/22  8:44 PM   Specimen: Stool  Result Value Ref Range Status   C Diff antigen NEGATIVE NEGATIVE Final   C Diff toxin NEGATIVE NEGATIVE Final   C Diff interpretation No C. difficile detected.  Final    Comment: Performed at [facility], [place] [address], [place], [place] [address]  Gastrointestinal Panel by PCR , Stool     Status: None   Collection Time: 01/24/22  8:44 PM   Specimen: Stool  Result Value Ref Range Status   Campylobacter species NOT DETECTED NOT DETECTED Final   Plesimonas shigelloides NOT DETECTED NOT DETECTED Final   Salmonella species NOT DETECTED NOT DETECTED Final   Yersinia enterocolitica NOT DETECTED NOT DETECTED Final   Vibrio species NOT DETECTED NOT DETECTED Final   Vibrio cholerae NOT DETECTED NOT DETECTED Final   Enteroaggregative E coli (EAEC) NOT DETECTED NOT DETECTED Final   Enteropathogenic E coli (EPEC) NOT DETECTED NOT DETECTED Final   Enterotoxigenic E coli (ETEC) NOT DETECTED NOT DETECTED Final   Shiga like toxin producing E coli (STEC) NOT DETECTED NOT DETECTED Final   Shigella/Enteroinvasive E coli (EIEC) NOT DETECTED NOT DETECTED Final   Cryptosporidium NOT DETECTED NOT DETECTED Final   Cyclospora cayetanensis NOT DETECTED NOT DETECTED Final   Entamoeba histolytica NOT DETECTED NOT DETECTED Final   Giardia lamblia NOT DETECTED NOT DETECTED Final   Adenovirus F40/41 NOT DETECTED NOT DETECTED  Final   Astrovirus NOT DETECTED NOT DETECTED Final   Norovirus GI/GII NOT DETECTED NOT DETECTED Final   Rotavirus A NOT DETECTED NOT DETECTED Final   Sapovirus (I, II, IV, and V) NOT DETECTED NOT DETECTED Final    Comment: Performed at [facility], [address] [place], [place] [address]     Labs: BNP (last 3 results) Recent Labs    08/22/21 1331  BNP 9.5   Basic Metabolic Panel: Recent Labs  Lab 01/24/22 1405 01/25/22 0331  NA 141 141  K 3.6 3.6  CL 106 108  CO2 28 27  GLUCOSE 145* 108*  BUN 8 5*  CREATININE 0.85 0.63  CALCIUM 9.1 8.1*   Liver Function Tests: Recent Labs  Lab 01/24/22 1405 01/25/22 0331  AST 23 18  ALT 17 14  ALKPHOS 73 56  BILITOT 0.6 0.9  PROT 8.4* 6.2*  ALBUMIN 3.5 2.8*   Recent Labs  Lab 01/24/22 1405  LIPASE 31   No results for input(s): "AMMONIA" in the last 168 hours. CBC: Recent Labs  Lab 01/24/22 1405 01/24/22 2240 01/25/22 0331  WBC 8.1  --  8.4  NEUTROABS 4.0  --   --   HGB 12.7 12.2 10.1*  HCT 39.6 38.1 31.9*  MCV 90.8  --  91.4  PLT 395  --  309   Cardiac Enzymes: No results for input(s): "CKTOTAL", "CKMB", "CKMBINDEX", "TROPONINI" in the last 168 hours. BNP: Invalid input(s): "POCBNP" CBG: No results for input(s): "GLUCAP" in the last 168 hours. D-Dimer No results for input(s): "DDIMER" in the last 72 hours. Hgb A1c No results for input(s): "HGBA1C" in the last 72 hours. Lipid Profile No results for input(s): "CHOL", "HDL", "LDLCALC", "TRIG", "CHOLHDL", "LDLDIRECT" in the last 72 hours. Thyroid function studies No results for input(s): "TSH", "T4TOTAL", "T3FREE", "THYROIDAB" in the last 72 hours.  Invalid input(s): "FREET3" Anemia work up No results for input(s): "VITAMINB12", "FOLATE", "FERRITIN", "TIBC", "IRON", "RETICCTPCT" in the last 72 hours. Urinalysis    Component Value Date/Time   COLORURINE YELLOW 01/17/2022 1456   APPEARANCEUR CLEAR 01/17/2022 1456   LABSPEC 1.023 01/17/2022  1456   PHURINE 5.5 01/17/2022 1456   GLUCOSEU NEGATIVE 01/17/2022 1456   GLUCOSEU NEGATIVE 05/29/2019 1013   HGBUR TRACE (A) 01/17/2022 1456   BILIRUBINUR NEGATIVE 01/17/2022 1456   BILIRUBINUR n 03/22/2014 1725   KETONESUR NEGATIVE 01/17/2022 1456   PROTEINUR 30 (A) 01/17/2022 1456   UROBILINOGEN 0.2 05/29/2019 1013   NITRITE NEGATIVE 01/17/2022 1456   LEUKOCYTESUR NEGATIVE 01/17/2022 1456   Sepsis Labs Recent Labs  Lab 01/24/22 1405 01/25/22 0331  WBC 8.1 8.4   Microbiology Recent Results (from the past 240 hour(s))  C Difficile Quick Screen w PCR reflex     Status: None   Collection Time: 01/24/22  8:44 PM   Specimen: Stool  Result Value Ref Range Status   C Diff antigen NEGATIVE NEGATIVE Final   C Diff toxin NEGATIVE NEGATIVE Final   C Diff interpretation No C. difficile detected.  Final    Comment: Performed at [facility], [place] [address], [place], [place] [address]  Gastrointestinal Panel by PCR , Stool     Status: None   Collection Time: 01/24/22  8:44 PM   Specimen: Stool  Result Value Ref Range Status   Campylobacter species NOT DETECTED NOT DETECTED Final   Plesimonas shigelloides NOT DETECTED NOT DETECTED Final   Salmonella species NOT DETECTED NOT DETECTED Final   Yersinia enterocolitica NOT DETECTED NOT DETECTED Final   Vibrio species NOT DETECTED NOT DETECTED Final   Vibrio cholerae NOT DETECTED NOT DETECTED Final   Enteroaggregative E coli (EAEC) NOT DETECTED NOT DETECTED Final   Enteropathogenic E coli (EPEC) NOT DETECTED NOT DETECTED Final   Enterotoxigenic E coli (ETEC)  NOT DETECTED NOT DETECTED Final   Shiga like toxin producing E coli (STEC) NOT DETECTED NOT DETECTED Final   Shigella/Enteroinvasive E coli (EIEC) NOT DETECTED NOT DETECTED Final   Cryptosporidium NOT DETECTED NOT DETECTED Final   Cyclospora cayetanensis NOT DETECTED NOT DETECTED Final   Entamoeba histolytica NOT DETECTED NOT DETECTED Final   Giardia lamblia NOT  DETECTED NOT DETECTED Final   Adenovirus F40/41 NOT DETECTED NOT DETECTED Final   Astrovirus NOT DETECTED NOT DETECTED Final   Norovirus GI/GII NOT DETECTED NOT DETECTED Final   Rotavirus A NOT DETECTED NOT DETECTED Final   Sapovirus (I, II, IV, and V) NOT DETECTED NOT DETECTED Final    Comment: Performed at [facility], [address], [place],  [address]     Time coordinating discharge: Over 30 minutes  SIGNED:   Nicolette Bang, MD  [facility] 01/26/2022, 1:30 PM Pager   If 7PM-7AM, please contact night-coverage [email or web address] Password [email or web address]

## 2022-01-26 NOTE — Plan of Care (Signed)
  Problem: Education: Goal: Knowledge of General Education information will improve Description Including pain rating scale, medication(s)/side effects and non-pharmacologic comfort measures Outcome: Progressing   Problem: Health Behavior/Discharge Planning: Goal: Ability to manage health-related needs will improve Outcome: Progressing   

## 2022-01-27 LAB — CALPROTECTIN, FECAL: Calprotectin, Fecal: 3710 ug/g — ABNORMAL HIGH (ref 0–120)

## 2022-01-27 NOTE — Anesthesia Postprocedure Evaluation (Signed)
Anesthesia Post Note  Patient: Danielle Bond  Procedure(s) Performed: COLONOSCOPY BIOPSY     Patient location during evaluation: Endoscopy Anesthesia Type: MAC Level of consciousness: awake and alert Pain management: pain level controlled Vital Signs Assessment: post-procedure vital signs reviewed and stable Respiratory status: spontaneous breathing, nonlabored ventilation, respiratory function stable and patient connected to nasal cannula oxygen Cardiovascular status: stable and blood pressure returned to baseline Postop Assessment: no apparent nausea or vomiting Anesthetic complications: no   No notable events documented.  Last Vitals:  Vitals:   01/26/22 0840 01/26/22 1301  BP: 122/85 140/86  Pulse: (!) 108 91  Resp:  18  Temp:  36.9 C  SpO2:  94%    Last Pain:  Vitals:   01/26/22 1301  TempSrc: Oral  PainSc:    Pain Goal:                   March Rummage Marquelle Musgrave

## 2022-01-30 ENCOUNTER — Encounter: Payer: Self-pay | Admitting: Family Medicine

## 2022-01-30 ENCOUNTER — Ambulatory Visit (INDEPENDENT_AMBULATORY_CARE_PROVIDER_SITE_OTHER): Payer: BC Managed Care – PPO | Admitting: Family Medicine

## 2022-01-30 VITALS — BP 140/80 | HR 111 | Temp 98.0°F | Ht 60.0 in | Wt 131.0 lb

## 2022-01-30 DIAGNOSIS — B351 Tinea unguium: Secondary | ICD-10-CM | POA: Diagnosis not present

## 2022-01-30 DIAGNOSIS — E039 Hypothyroidism, unspecified: Secondary | ICD-10-CM

## 2022-01-30 DIAGNOSIS — N390 Urinary tract infection, site not specified: Secondary | ICD-10-CM | POA: Diagnosis not present

## 2022-01-30 DIAGNOSIS — G43909 Migraine, unspecified, not intractable, without status migrainosus: Secondary | ICD-10-CM

## 2022-01-30 DIAGNOSIS — K51011 Ulcerative (chronic) pancolitis with rectal bleeding: Secondary | ICD-10-CM

## 2022-01-30 LAB — POCT URINALYSIS DIPSTICK
Bilirubin, UA: NEGATIVE
Blood, UA: NEGATIVE
Glucose, UA: NEGATIVE
Ketones, UA: NEGATIVE
Leukocytes, UA: NEGATIVE
Nitrite, UA: NEGATIVE
Protein, UA: NEGATIVE
Spec Grav, UA: 1.015 (ref 1.010–1.025)
Urobilinogen, UA: 0.2 E.U./dL
pH, UA: 6 (ref 5.0–8.0)

## 2022-01-30 NOTE — Assessment & Plan Note (Signed)
We will avoid terbinafine for now.  She can try topical Lamisil.  May consider Jublia if not improving.

## 2022-01-30 NOTE — Assessment & Plan Note (Signed)
Symptoms have been improving since being discharged from the hospital.  She is currently on prednisone taper and will be starting Rinvoq soon.  Defer further management to GI.

## 2022-01-30 NOTE — Progress Notes (Signed)
   Danielle Bond is a 61 y.o. female who presents today for an office visit.  Assessment/Plan:  New/Acute Problems: Dysuria No signs of infection on UA.  We will check urine culture to rule out UTI.  No red flags or signs of systemic illness  Chronic Problems Addressed Today: Chronic ulcerative pancolitis (HCC) Symptoms have been improving since being discharged from the hospital.  She is currently on prednisone taper and will be starting Rinvoq soon.  Defer further management to GI.   Onychomycosis We will avoid terbinafine for now.  She can try topical Lamisil.  May consider Jublia if not improving.  Hypothyroidism s/p total thyroidectomy 1997 Check TSH.  Continue Synthroid 100 mcg daily.     Subjective:  HPI:  Patient here for hospitalization follow-up.  She presented to the ED on 01/24/2022 with pain and rectal bleeding.  Prior to this she also went to the emergency room on 6/28 for the same.  At that time she was started on budesonide by GI.  This has not helped.  During her hospitalization she underwent colonoscopy which showed flare of her ulcerative colitis.  She was started on IV steroids and had a rapid improvement in symptoms.She was discharged home on 01/26/2022 with prednisone taper.  She has been doing much better since being home.her bowel movements seems to be improving.  Still has minimal amount of bleeding.  She still has a fair amount of weakness this seems to be recovering.  She is concerned about possible urinary tract infection.  She has noticed dysuria for the last couple of days.  Does not feel like typical UTI.  No fevers or chills.  She will be following up with gastroenterology soon.  They will be starting her on Rinvoq.  She has been doing well with her prednisone taper..  She is also concerned about toenail fungus on the right great toe.  No specific treatments tried.       Objective:  Physical Exam: BP 140/80   Pulse (!) 111   Temp 98 F (36.7 C)  (Temporal)   Ht 5' (1.524 m)   Wt 131 lb (59.4 kg)   LMP 03/03/2013   SpO2 97%   BMI 25.58 kg/m   Wt Readings from Last 3 Encounters:  01/30/22 131 lb (59.4 kg)  01/24/22 132 lb 4.4 oz (60 kg)  01/16/22 135 lb (61.2 kg)  Gen: No acute distress, resting comfortably CV: Regular rate and rhythm with no murmurs appreciated Pulm: Normal work of breathing, clear to auscultation bilaterally with no crackles, wheezes, or rhonchi MSK: Onychomycosis on right great toenail. Neuro: Grossly normal, moves all extremities Psych: Normal affect and thought content  Time Spent: 45 minutes of total time was spent on the date of the encounter performing the following actions: chart review prior to seeing the patient including recent hospitalization, obtaining history, performing a medically necessary exam, counseling on the treatment plan, placing orders, and documenting in our EHR.        [name]. [name], MD 01/30/2022 3:41 PM

## 2022-01-30 NOTE — Patient Instructions (Signed)
It was very nice to see you today!  I am glad that you are feeling better.  We will check blood work today.  You can try using Lamisil for your toenail.    We will make sure that you do not have a urinary tract infection.  Please let us know if you have any worsening symptoms.  Take care, Dr [name]  PLEASE NOTE:  If you had any lab tests please let us know if you have not heard back within a few days. You may see your results on mychart before we have a chance to review them but we will give you a call once they are reviewed by [name]. If we ordered any referrals today, please let us know if you have not heard from their office within the next week.   Please try these tips to maintain a healthy lifestyle:  Eat at least 3 REAL meals and 1-2 snacks per day.  Aim for no more than 5 hours between eating.  If you eat breakfast, please do so within one hour of getting up.   Each meal should contain half fruits/vegetables, one quarter protein, and one quarter carbs (no bigger than a computer mouse)  Cut down on sweet beverages. This includes juice, soda, and sweet tea.   Drink at least 1 glass of water with each meal and aim for at least 8 glasses per day  Exercise at least 150 minutes every week.

## 2022-01-30 NOTE — Assessment & Plan Note (Signed)
Check TSH.  Continue Synthroid 100 mcg daily.

## 2022-01-31 DIAGNOSIS — K5190 Ulcerative colitis, unspecified, without complications: Secondary | ICD-10-CM | POA: Diagnosis not present

## 2022-01-31 DIAGNOSIS — K5100 Ulcerative (chronic) pancolitis without complications: Secondary | ICD-10-CM | POA: Diagnosis not present

## 2022-01-31 LAB — CBC
HCT: 34.8 % — ABNORMAL LOW (ref 36.0–46.0)
Hemoglobin: 11.5 g/dL — ABNORMAL LOW (ref 12.0–15.0)
MCHC: 33 g/dL (ref 30.0–36.0)
MCV: 90.3 fl (ref 78.0–100.0)
Platelets: 373 10*3/uL (ref 150.0–400.0)
RBC: 3.86 Mil/uL — ABNORMAL LOW (ref 3.87–5.11)
RDW: 12.9 % (ref 11.5–15.5)
WBC: 8.6 10*3/uL (ref 4.0–10.5)

## 2022-01-31 LAB — COMPREHENSIVE METABOLIC PANEL
ALT: 18 U/L (ref 0–35)
AST: 26 U/L (ref 0–37)
Albumin: 3.7 g/dL (ref 3.5–5.2)
Alkaline Phosphatase: 60 U/L (ref 39–117)
BUN: 14 mg/dL (ref 6–23)
CO2: 27 mEq/L (ref 19–32)
Calcium: 8.9 mg/dL (ref 8.4–10.5)
Chloride: 101 mEq/L (ref 96–112)
Creatinine, Ser: 0.84 mg/dL (ref 0.40–1.20)
GFR: 75.1 mL/min (ref >60.00)
Glucose, Bld: 255 mg/dL — ABNORMAL HIGH (ref 70–99)
Potassium: 4.3 mEq/L (ref 3.5–5.1)
Sodium: 136 mEq/L (ref 135–145)
Total Bilirubin: 0.4 mg/dL (ref 0.2–1.2)
Total Protein: 7.4 g/dL (ref 6.0–8.3)

## 2022-01-31 LAB — TSH: TSH: 0.11 u[IU]/mL — ABNORMAL LOW (ref 0.35–5.50)

## 2022-01-31 LAB — URINE CULTURE
MICRO NUMBER:: 13631324
Result:: NO GROWTH
SPECIMEN QUALITY:: ADEQUATE

## 2022-02-01 NOTE — Progress Notes (Signed)
Please inform patient of the following:  Her blood sugar is up but this is to be expected with the steroids that she is on.  Her thyroid level is up a little bit as well.  This is possibly due to her recent illnesses.  I would like for her to come back in a few weeks to recheck before we make any dose changes.  Please place future order for TSH.  Everything else is stable.

## 2022-02-02 ENCOUNTER — Telehealth: Payer: Self-pay | Admitting: Family Medicine

## 2022-02-05 ENCOUNTER — Telehealth: Payer: Self-pay | Admitting: *Deleted

## 2022-02-05 DIAGNOSIS — E039 Hypothyroidism, unspecified: Secondary | ICD-10-CM

## 2022-02-05 NOTE — Telephone Encounter (Signed)
x

## 2022-02-05 NOTE — Telephone Encounter (Signed)
See results note. 

## 2022-02-15 ENCOUNTER — Other Ambulatory Visit: Payer: Self-pay | Admitting: Family Medicine

## 2022-02-15 DIAGNOSIS — Z6826 Body mass index (BMI) 26.0-26.9, adult: Secondary | ICD-10-CM | POA: Diagnosis not present

## 2022-02-15 DIAGNOSIS — R300 Dysuria: Secondary | ICD-10-CM | POA: Diagnosis not present

## 2022-02-15 DIAGNOSIS — Z1211 Encounter for screening for malignant neoplasm of colon: Secondary | ICD-10-CM | POA: Diagnosis not present

## 2022-02-15 DIAGNOSIS — Z01419 Encounter for gynecological examination (general) (routine) without abnormal findings: Secondary | ICD-10-CM | POA: Diagnosis not present

## 2022-02-15 DIAGNOSIS — Z1231 Encounter for screening mammogram for malignant neoplasm of breast: Secondary | ICD-10-CM | POA: Diagnosis not present

## 2022-02-15 LAB — HM MAMMOGRAPHY

## 2022-02-16 ENCOUNTER — Other Ambulatory Visit: Payer: BC Managed Care – PPO

## 2022-02-19 ENCOUNTER — Other Ambulatory Visit (INDEPENDENT_AMBULATORY_CARE_PROVIDER_SITE_OTHER): Payer: BC Managed Care – PPO

## 2022-02-19 DIAGNOSIS — E039 Hypothyroidism, unspecified: Secondary | ICD-10-CM | POA: Diagnosis not present

## 2022-02-19 LAB — TSH: TSH: 6.73 u[IU]/mL — ABNORMAL HIGH (ref 0.35–5.50)

## 2022-02-19 NOTE — Progress Notes (Signed)
Please inform patient of the following:  Her TSH is up quite a bit since last time we checked a couple of weeks ago.  All of this could be due to the steroids that she received recently.  Would not recommend making any changes at this point.  She should continue her current dose if she is feeling fine and we should recheck again in 4 to 6 weeks.

## 2022-02-20 ENCOUNTER — Other Ambulatory Visit: Payer: BC Managed Care – PPO

## 2022-02-20 NOTE — Progress Notes (Signed)
Can we please clariy with patient? We have it in her chart as 152mg daily. I do not see where she has been taking different doses each week. Can we please clarify what doses she has been on?  CAlgis Greenhouse PJerline Pain MD 02/20/2022 1:53 PM

## 2022-02-21 ENCOUNTER — Ambulatory Visit
Admission: RE | Admit: 2022-02-21 | Discharge: 2022-02-21 | Disposition: A | Discharge status: Home / Self Care | Payer: BC Managed Care – PPO | Source: Ambulatory Visit | Attending: Physician Assistant | Admitting: Physician Assistant

## 2022-02-21 DIAGNOSIS — R200 Anesthesia of skin: Secondary | ICD-10-CM

## 2022-02-21 DIAGNOSIS — R519 Headache, unspecified: Secondary | ICD-10-CM | POA: Diagnosis not present

## 2022-02-22 ENCOUNTER — Telehealth: Payer: Self-pay

## 2022-02-22 ENCOUNTER — Other Ambulatory Visit: Payer: Self-pay | Admitting: *Deleted

## 2022-02-22 DIAGNOSIS — R739 Hyperglycemia, unspecified: Secondary | ICD-10-CM

## 2022-02-22 NOTE — Telephone Encounter (Signed)
Not to my knolwedge.  Thanks MJP

## 2022-02-22 NOTE — Telephone Encounter (Signed)
Tried calling patient no answer left a vm

## 2022-02-22 NOTE — Progress Notes (Signed)
YEs it is ok to order A1c as well.  Danielle Bond. [name], MD 02/22/2022 8:11 AM

## 2022-02-23 ENCOUNTER — Encounter: Payer: Self-pay | Admitting: Family Medicine

## 2022-02-23 NOTE — Telephone Encounter (Signed)
Tried calling patient no answer left a vm

## 2022-02-23 NOTE — Telephone Encounter (Signed)
Patient aware.

## 2022-02-24 NOTE — Progress Notes (Deleted)
Office Visit Note  Patient: Danielle Bond             Date of Birth: [birth year]           MRN: [id number]             PCP: [name], MD Referring: Lorine Bears, NP Visit Date: 02/26/2022 Occupation: @GUAROCC @  Subjective:  No chief complaint on file.   History of Present Illness: NAYZETH ALTMAN is a 61 y.o. female here for inflammatory arthritis previously attributed as UC associated inflammatory arthritis and/or seronegative RA on Humira treatment. Previously seeing Dr. Kathlene November with [facility]. She has a pervious history of biopsy confirmed autoimmune hepatitis in 2014 treated on sulfasalazine and previous positive ANA serology. She had good response to Humira treatment with secondary loss of efficacy first with flare ups in multiple areas both peripheral joints including hands as well as SI joint pain. She has had limited benefit with prednisone treatment during breakthrough symptoms. SI joint injection in April was beneficial for pain. GI symptoms also returned with diarrhea and colonoscopy in July with extensive inflammation changes.  She was not a candidate for methotrexate or leflunomide due to previous liver dysfunction.  Labs reviewed 07/2021 ADA neutralizing antibodies neg  06/2021 ANA neg RF neg HBV neg HCV neg ***   Activities of Daily Living:  Patient reports morning stiffness for *** {minute/hour:19697}.   Patient {ACTIONS;DENIES/REPORTS:21021675::"Denies"} nocturnal pain.  Difficulty dressing/grooming: {ACTIONS;DENIES/REPORTS:21021675::"Denies"} Difficulty climbing stairs: {ACTIONS;DENIES/REPORTS:21021675::"Denies"} Difficulty getting out of chair: {ACTIONS;DENIES/REPORTS:21021675::"Denies"} Difficulty using hands for taps, buttons, cutlery, and/or writing: {ACTIONS;DENIES/REPORTS:21021675::"Denies"}  No Rheumatology ROS completed.   PMFS History:  Patient Active Problem List   Diagnosis Date Noted   Onychomycosis 01/30/2022   Arthralgia  07/19/2021   Immunosuppressed status ([place]) 07/19/2021   Eustachian tube dysfunction 06/13/2021   Osteoporosis 07/11/2020   Atherosclerosis of native coronary artery of native heart without angina pectoris 06/08/2019   Aortic atherosclerosis ([place]) 06/08/2019   Mixed hyperlipidemia 05/02/2019   Postural dizziness with presyncope 05/02/2019   HSV-2 (herpes simplex virus 2) infection 11/04/2018   Hypothyroidism s/p total thyroidectomy 1997 08/15/2018   Autoimmune hepatitis ([place]) 08/16/2012   Asthma 03/31/2012   Chronic ulcerative pancolitis ([place]) 03/31/2012   History of renal calculi 03/31/2012   Migraines 03/31/2012   Mitral valve prolapse     Past Medical History:  Diagnosis Date   Asthma    Chronic kidney disease    Hx of pancreatitis 06/16/2018   Kidney stone    Kidney stone    Migraine    Orthostatic hypotension    Osteopenia 03/31/2012   Recurrent upper respiratory infection (URI)    Ulcerative colitis ([place]) 03/31/2012   Urticaria     Family History  Problem Relation Age of Onset   Cervical cancer Paternal Aunt    Heart disease Paternal Grandmother    Heart disease Mother    Dementia Mother    Thyroid disease Mother    Osteoarthritis Mother    Asthma Mother    Heart attack Father    Breast cancer Cousin    Heart attack Brother    Heart disease Brother    Past Surgical History:  Procedure Laterality Date   BIOPSY  01/25/2022   Procedure: BIOPSY;  Surgeon: Ronnette Juniper, MD;  Location: [facility];  Service: Gastroenterology;;   CESAREAN SECTION     x3   CHOLECYSTECTOMY     COLONOSCOPY     COLONOSCOPY N/A 01/25/2022   Procedure: COLONOSCOPY;  Surgeon: Ronnette Juniper, MD;  Location: [facility];  Service: Gastroenterology;  Laterality: N/A;   LITHOTRIPSY     THYROIDECTOMY     1997   Social History   Social History Narrative   Lives at home with her husband.   Right-handed.   No caffeine use.   Immunization History  Administered Date(s) Administered   Fluad  Quad(high Dose 65+) 04/01/2021   Influenza Inj Mdck Quad Pf 03/23/2018   Influenza Inj Mdck Quad With Preservative 05/01/2019   Influenza Split 04/10/2012, 04/24/2013   Influenza, Seasonal, Injecte, Preservative Fre 04/15/2014   Influenza,inj,Quad PF,6+ Mos 03/23/2020   Influenza,inj,quad, With Preservative 05/02/2015   Influenza,trivalent, recombinat, inj, PF 04/10/2012, 04/24/2013   Influenza-Unspecified 04/22/2020   Moderna Covid-19 Vaccine Bivalent Booster 60yr & up 04/24/2021   Moderna Sars-Covid-2 Vaccination 10/09/2019, 11/09/2019, 05/16/2020, 11/13/2020   PPD Test 10/05/2013   Pneumococcal Polysaccharide-23 04/22/2008, 04/23/2013, 05/24/2020   Tdap 07/23/2004, 07/22/2012   Varicella 07/22/2012     Objective: Vital Signs: LMP 03/03/2013    Physical Exam   Musculoskeletal Exam: ***  CDAI Exam: CDAI Score: -- Patient Global: --; Provider Global: -- Swollen: --; Tender: -- Joint Exam 02/26/2022   No joint exam has been documented for this visit   There is currently no information documented on the homunculus. Go to the Rheumatology activity and complete the homunculus joint exam.  Investigation: No additional findings.  Imaging: MR Brain Wo Contrast  Result Date: 02/21/2022 CLINICAL DATA:  Headache, new or worsening (Age >= 50y) EXAM: MRI HEAD WITHOUT CONTRAST TECHNIQUE: Multiplanar, multiecho pulse sequences of the brain and surrounding structures were obtained without intravenous contrast. COMPARISON:  2018 FINDINGS: Brain: There is no acute infarction or intracranial hemorrhage. There is no intracranial mass, mass effect, or edema. There is no hydrocephalus or extra-axial fluid collection. Ventricles and sulci are within normal limits in size and configuration. A few small foci of T2 hyperintensity are present in the supratentorial white matter and likely reflect nonspecific gliosis/demyelination such as minimal chronic microvascular ischemic changes. Vascular: Major  vessel flow voids at the skull base are preserved. Skull and upper cervical spine: Normal marrow signal is preserved. Sinuses/Orbits: Paranasal sinuses are aerated. Orbits are unremarkable. Other: Sella is unremarkable.  Mastoid air cells are clear. IMPRESSION: No evidence of recent infarction, hemorrhage, or mass. Electronically Signed   By: PMacy MisM.D.   On: 02/21/2022 10:56    Recent Labs: Lab Results  Component Value Date   WBC 8.6 01/30/2022   HGB 11.5 (L) 01/30/2022   PLT 373.0 01/30/2022   NA 136 01/30/2022   K 4.3 01/30/2022   CL 101 01/30/2022   CO2 27 01/30/2022   GLUCOSE 255 (H) 01/30/2022   BUN 14 01/30/2022   CREATININE 0.84 01/30/2022   BILITOT 0.4 01/30/2022   ALKPHOS 60 01/30/2022   AST 26 01/30/2022   ALT 18 01/30/2022   PROT 7.4 01/30/2022   ALBUMIN 3.7 01/30/2022   CALCIUM 8.9 01/30/2022   GFRAA >60 03/03/2019   QFTBGOLDPLUS NEGATIVE 06/16/2018    Speciality Comments: No specialty comments available.  Procedures:  No procedures performed Allergies: Almond oil, Lialda [mesalamine], Other, Aspirin, Hydromorphone hcl, Penicillins, Acetaminophen, Apriso [mesalamine er], Corticosteroids, Imitrex [sumatriptan], Stadol [butorphanol], Topamax [topiramate], Vancomycin, Azathioprine, Pepcid [famotidine], and Prednisone   Assessment / Plan:     Visit Diagnoses: No diagnosis found.  Orders: No orders of the defined types were placed in this encounter.  No orders of the defined types were placed in this encounter.  Face-to-face time spent with patient was *** minutes. Greater than 50% of time was spent in counseling and coordination of care.  Follow-Up Instructions: No follow-ups on file.   Collier Salina, MD  Note - This record has been created using [employer].  Chart creation errors have been sought, but may not always  have been located. Such creation errors do not reflect on  the standard of medical care.

## 2022-02-26 ENCOUNTER — Ambulatory Visit: Payer: BC Managed Care – PPO | Admitting: Internal Medicine

## 2022-03-02 ENCOUNTER — Telehealth: Payer: Self-pay | Admitting: Family Medicine

## 2022-03-02 ENCOUNTER — Other Ambulatory Visit: Payer: BC Managed Care – PPO

## 2022-03-02 NOTE — Telephone Encounter (Signed)
Left message to return call to our office at their convenience.   Your imaging results have returned and are overall normal.

## 2022-03-02 NOTE — Telephone Encounter (Signed)
Patient states: - Numbness has spread to her right eye which has become concerning to her  Patient requests: -Someone clinical from PCP's team call and explain MRI results to her as soon as possible

## 2022-03-02 NOTE — Telephone Encounter (Signed)
Patient has called back.  I have given her [place] response to MRI.    Patient understood.  Is requesting call back.  Would like to know what next steps would be?

## 2022-03-08 ENCOUNTER — Ambulatory Visit: Payer: BC Managed Care – PPO | Admitting: Adult Health

## 2022-03-09 ENCOUNTER — Telehealth: Payer: Self-pay

## 2022-03-09 NOTE — Telephone Encounter (Signed)
GO TO ER °

## 2022-03-09 NOTE — Telephone Encounter (Signed)
Pt called to inform us that yesterday she had cp with pressure  and jaw pain. Pt bp was 159/99 pulse of 140. Pt mention she called yesterday and was send to vm. Pt mention she has been feeling with left numbness since Friday but her pain was worse yesterday. Pt mention she has discomfort on her back her bp today is 123/92 pulse 84. Pt thought is was doing to her new medication rinvoq.

## 2022-03-15 ENCOUNTER — Other Ambulatory Visit (INDEPENDENT_AMBULATORY_CARE_PROVIDER_SITE_OTHER): Payer: BC Managed Care – PPO

## 2022-03-15 ENCOUNTER — Other Ambulatory Visit: Payer: Self-pay | Admitting: *Deleted

## 2022-03-15 DIAGNOSIS — R739 Hyperglycemia, unspecified: Secondary | ICD-10-CM

## 2022-03-15 DIAGNOSIS — E039 Hypothyroidism, unspecified: Secondary | ICD-10-CM

## 2022-03-15 LAB — HEMOGLOBIN A1C: Hgb A1c MFr Bld: 6.3 % (ref 4.6–6.5)

## 2022-03-16 ENCOUNTER — Other Ambulatory Visit: Payer: BC Managed Care – PPO

## 2022-03-20 NOTE — Progress Notes (Signed)
Please inform patient of the following:  Her A1c is stable at 6.3.  She should continue to work on diet and exercise and we can recheck this in about a year.

## 2022-03-21 ENCOUNTER — Encounter: Payer: Self-pay | Admitting: Adult Health

## 2022-03-21 ENCOUNTER — Ambulatory Visit (INDEPENDENT_AMBULATORY_CARE_PROVIDER_SITE_OTHER): Payer: BC Managed Care – PPO | Admitting: Adult Health

## 2022-03-21 ENCOUNTER — Telehealth: Payer: Self-pay | Admitting: Family Medicine

## 2022-03-21 VITALS — BP 132/70 | HR 71 | Temp 98.1°F | Ht 60.0 in | Wt 138.4 lb

## 2022-03-21 DIAGNOSIS — R059 Cough, unspecified: Secondary | ICD-10-CM | POA: Diagnosis not present

## 2022-03-21 DIAGNOSIS — J4540 Moderate persistent asthma, uncomplicated: Secondary | ICD-10-CM

## 2022-03-21 DIAGNOSIS — K51011 Ulcerative (chronic) pancolitis with rectal bleeding: Secondary | ICD-10-CM | POA: Diagnosis not present

## 2022-03-21 DIAGNOSIS — J310 Chronic rhinitis: Secondary | ICD-10-CM | POA: Diagnosis not present

## 2022-03-21 LAB — NITRIC OXIDE: Nitric Oxide: 13

## 2022-03-21 NOTE — Assessment & Plan Note (Signed)
Control for triggers.  Flonase and Claritin as needed

## 2022-03-21 NOTE — Telephone Encounter (Signed)
Patient has called in to request recent lab results.  I have given her Dr. Ellwood Handler response.  Patient states TSH was to have been checked as well.  Please follow up with patient in regard at [phone].

## 2022-03-21 NOTE — Progress Notes (Signed)
@Patient  ID: Danielle Bond, female    DOB: 1960-11-14, 61 y.o.   MRN: [id number]  Chief Complaint  Patient presents with   Follow-up    Referring provider: [name], MD  HPI: 61 year old female never smoker followed for moderate persistent asthma with an allergic phenotype and chronic cough History of eosinophilia felt secondary to Lialda  History of Graves' disease, ulcerative colitis,  pancreatitis  TEST/EVENTS :  PFTs February 02, 2020 shows mild restriction with FEV1 at 85%, ratio 89, FVC 74%, no significant bronchodilator response, DLCO at 109%.  01/2022 Eosinophils -600   03/21/2022 Follow up : Asthma and chronic cough Patient returns for a 63-monthfollow-up.  Patient has underlying asthma with allergic phenotype and chronic cough.  She says overall she is doing well.  No flare of her cough or wheezing.  No increased albuterol use.  She remains on Symbicort twice daily.  Feels that Symbicort has really made a huge difference in her breathing.  She feels so much better. ACT score 21. Activity level remains at baseline. Feels good breathing , now able to go to gym do yoga and exercising.  Dog passed away , wonders if better now that she is not around dog dander.   Recent admission last month with UC flare and rectal bleeding. Dx with pancolitis . Changed from Humira to Rinvvog.  She says her symptoms have improved since switching over to a RINVOG.      Allergies  Allergen Reactions   Almond Oil Hives and Shortness Of Breath   Lialda [Mesalamine] Other (See Comments)    Vomiting and rectal bleeding   Other Anaphylaxis    Almonds, tree nuts   Aspirin Hives and Nausea Only    Stomach cramps   Hydromorphone Hcl Other (See Comments)    Caused Enzyme levels to go up   Penicillins Hives and Swelling    Has patient had a PCN reaction causing immediate rash, facial/tongue/throat swelling, SOB or lightheadedness with hypotension: Yes Has patient had a PCN reaction causing  severe rash involving mucus membranes or skin necrosis: No Has patient had a PCN reaction that required hospitalization: No Has patient had a PCN reaction occurring within the last 10 years: No  Stomach cramps If all of the above answers are "NO", then may proceed with Cephalosporin use.    Acetaminophen Other (See Comments)    Has autoimmune hepatitis, wants to avoid APAP   Apriso [Mesalamine Er] Nausea And Vomiting   Corticosteroids Other (See Comments)    Had acute pancreatitis x 2 after steroid use.   Imitrex [Sumatriptan] Swelling    Injection caused swelling   Stadol [Butorphanol] Other (See Comments)    hallucinates   Topamax [Topiramate] Other (See Comments)    Kidney stones   Vancomycin Hives   Azathioprine Rash   Pepcid [Famotidine] Nausea And Vomiting    Makes acid worse   Prednisone Nausea And Vomiting    All steroids causes nausea and acute pancreatitis     Immunization History  Administered Date(s) Administered   Fluad Quad(high Dose 65+) 04/01/2021   Influenza Inj Mdck Quad Pf 03/23/2018   Influenza Inj Mdck Quad With Preservative 05/01/2019   Influenza Split 04/10/2012, 04/24/2013   Influenza, Seasonal, Injecte, Preservative Fre 04/15/2014   Influenza,inj,Quad PF,6+ Mos 03/23/2020   Influenza,inj,quad, With Preservative 05/02/2015   Influenza,trivalent, recombinat, inj, PF 04/10/2012, 04/24/2013   Influenza-Unspecified 04/22/2020   Moderna Covid-19 Vaccine Bivalent Booster 132yr& up 04/24/2021   Moderna Sars-Covid-2  Vaccination 10/09/2019, 11/09/2019, 05/16/2020, 11/13/2020   PPD Test 10/05/2013   Pneumococcal Polysaccharide-23 04/22/2008, 04/23/2013, 05/24/2020   Tdap 07/23/2004, 07/22/2012   Varicella 07/22/2012    Past Medical History:  Diagnosis Date   Asthma    Chronic kidney disease    Hx of pancreatitis 06/16/2018   Kidney stone    Kidney stone    Migraine    Orthostatic hypotension    Osteopenia 03/31/2012   Recurrent upper respiratory  infection (URI)    Ulcerative colitis ([place]) 03/31/2012   Urticaria     Tobacco History: Social History   Tobacco Use  Smoking Status Never  Smokeless Tobacco Never   Counseling given: Not Answered   Outpatient Medications Prior to Visit  Medication Sig Dispense Refill   albuterol (PROVENTIL) (2.5 MG/3ML) 0.083% nebulizer solution Take 3 mLs (2.5 mg total) by nebulization every 6 (six) hours as needed for wheezing or shortness of breath. 150 mL 1   albuterol (VENTOLIN HFA) 108 (90 Base) MCG/ACT inhaler TAKE 2 PUFFS BY MOUTH EVERY 6 HOURS AS NEEDED FOR WHEEZE OR SHORTNESS OF BREATH 25.5 each 1   budesonide-formoterol (SYMBICORT) 160-4.5 MCG/ACT inhaler Inhale 2 puffs into the lungs 2 (two) times daily.     cholecalciferol (VITAMIN D3) 25 MCG (1000 UNIT) tablet Take 1,000 Units by mouth daily.     EPINEPHRINE 0.3 mg/0.3 mL IJ SOAJ injection INJECT 0.3 MLS (0.3 MG TOTAL) INTO THE MUSCLE AS NEEDED FOR ANAPHYLAXIS. (Patient taking differently: Inject 0.3 mg into the muscle once as needed for anaphylaxis.) 2 each 1   fluticasone (FLONASE) 50 MCG/ACT nasal spray PLACE 2 SPRAYS INTO BOTH NOSTRILS DAILY AS NEEDED FOR ALLERGIES OR RHINITIS. 48 mL 1   folic acid (FOLVITE) 1 MG tablet TAKE 1 TABLET BY MOUTH EVERY DAY FOR 30 DAYS (Patient taking differently: Take 1 mg by mouth daily.) 90 tablet 1   Homeopathic Products (SIMILASAN ALLERGY EYE RELIEF OP) Place 1 drop into both eyes daily as needed (allergy).     Magnesium 500 MG TABS Take 500 mg by mouth daily.     metoprolol tartrate (LOPRESSOR) 25 MG tablet Take 1 tablet (25 mg total) by mouth daily. 180 tablet 1   Multiple Vitamin (MULTIVITAMIN WITH MINERALS) TABS tablet Take 1 tablet by mouth daily.     pantoprazole (PROTONIX) 40 MG tablet Take 40 mg by mouth daily.     RINVOQ 45 MG TB24 Take 1 tablet by mouth daily.     rosuvastatin (CRESTOR) 10 MG tablet TAKE 1 TABLET BY MOUTH EVERY DAY (Patient taking differently: Take 10 mg by mouth daily.) 90  tablet 3   SYNTHROID 100 MCG tablet Take 100 mcg by mouth every morning.     valACYclovir (VALTREX) 500 MG tablet Take 1 tablet by mouth daily as needed (breakouts).     vitamin C (ASCORBIC ACID) 500 MG tablet Take 500 mg by mouth daily.     budesonide-formoterol (SYMBICORT) 160-4.5 MCG/ACT inhaler Inhale 2 puffs into the lungs in the morning and at bedtime. (Patient not taking: Reported on 03/21/2022) 1 each 12   HUMIRA PEN 40 MG/0.4ML PNKT Inject 40 mg into the skin every 14 (fourteen) days.     triamcinolone cream (KENALOG) 0.1 % Apply 1 application topically daily as needed. (Patient taking differently: Apply 1 application  topically daily as needed (itching).) 453 g 6   No facility-administered medications prior to visit.     Review of Systems:   Constitutional:   No  weight loss, night  sweats,  Fevers, chills, fatigue, or  lassitude.  HEENT:   No headaches,  Difficulty swallowing,  Tooth/dental problems, or  Sore throat,                No sneezing, itching, ear ache,  +nasal congestion, post nasal drip,   CV:  No chest pain,  Orthopnea, PND, swelling in lower extremities, anasarca, dizziness, palpitations, syncope.   GI  No heartburn, indigestion, abdominal pain, nausea, vomiting, diarrhea, change in bowel habits, loss of appetite, bloody stools.   Resp: No shortness of breath with exertion or at rest.   No coughing up of blood.  No change in color of mucus.  No wheezing.  No chest wall deformity  Skin: no rash or lesions.  GU: no dysuria, change in color of urine, no urgency or frequency.  No flank pain, no hematuria   MS:  No joint pain or swelling.  No decreased range of motion.  No back pain.    Physical Exam  BP 132/70 (BP Location: Left Arm, Cuff Size: Normal)   Pulse 71   Temp 98.1 F (36.7 C) (Temporal)   Ht 5' (1.524 m)   Wt 138 lb 6.4 oz (62.8 kg)   LMP 03/03/2013   SpO2 98%   BMI 27.03 kg/m   GEN: A/Ox3; pleasant , NAD, well nourished    HEENT:  [place]/AT,   EACs-clear, TMs-wnl, NOSE-clear, THROAT-clear, no lesions, no postnasal drip or exudate noted.   NECK:  Supple w/ fair ROM; no JVD; normal carotid impulses w/o bruits; no thyromegaly or nodules palpated; no lymphadenopathy.    RESP  Clear  P & A; w/o, wheezes/ rales/ or rhonchi. no accessory muscle use, no dullness to percussion  CARD:  RRR, no m/r/g, no peripheral edema, pulses intact, no cyanosis or clubbing.  GI:   Soft & nt; nml bowel sounds; no organomegaly or masses detected.   Musco: Warm bil, no deformities or joint swelling noted.   Neuro: alert, no focal deficits noted.    Skin: Warm, no lesions or rashes    Lab Results:      ProBNP No results found for: "PROBNP"  Imaging:       Latest Ref Rng & Units 12/17/2019   10:45 AM  PFT Results  FVC-Pre L 2.15   FVC-Predicted Pre % 75   FVC-Post L 2.12   FVC-Predicted Post % 74   Pre FEV1/FVC % % 88   Post FEV1/FCV % % 89   FEV1-Pre L 1.89   FEV1-Predicted Pre % 85   FEV1-Post L 1.90   DLCO uncorrected ml/min/mmHg 19.37   DLCO UNC% % 109   DLCO corrected ml/min/mmHg 19.37   DLCO COR %Predicted % 109   DLVA Predicted % 136   TLC L 3.39   TLC % Predicted % 76   RV % Predicted % 66     Lab Results  Component Value Date   NITRICOXIDE 18 02/02/2020        Assessment & Plan:   Asthma Well-controlled on present regimen. Exhaled nitric oxide testing is 13 ppb  Plan  Patient Instructions  Delsym 2 tsp Twice daily  As needed  Cough . Claritin 75m daily As needed  drainage.  Symbicort 2 puffs Twice daily  , rinse after use.  Saline nasal rinses As needed   Albuterol inhaler As needed   Follow-up with Dr. RChase Callerin 6 months and and as needed Please contact office for sooner follow up if symptoms  do not improve or worsen or seek emergency care      Chronic ulcerative pancolitis [facility]) Recent admission now improved continue follow-up with the gastroenterology  Chronic rhinitis Control for  triggers.  Flonase and Claritin as needed     Rexene Edison, NP 03/21/2022

## 2022-03-21 NOTE — Patient Instructions (Signed)
Delsym 2 tsp Twice daily  As needed  Cough . Claritin 21m daily As needed  drainage.  Symbicort 2 puffs Twice daily  , rinse after use.  Saline nasal rinses As needed   Albuterol inhaler As needed   Follow-up with Dr. RChase Callerin 6 months and and as needed Please contact office for sooner follow up if symptoms do not improve or worsen or seek emergency care

## 2022-03-21 NOTE — Assessment & Plan Note (Signed)
Recent admission now improved continue follow-up with the gastroenterology

## 2022-03-21 NOTE — Assessment & Plan Note (Signed)
Well-controlled on present regimen. Exhaled nitric oxide testing is 13 ppb  Plan  Patient Instructions  Delsym 2 tsp Twice daily  As needed  Cough . Claritin 79m daily As needed  drainage.  Symbicort 2 puffs Twice daily  , rinse after use.  Saline nasal rinses As needed   Albuterol inhaler As needed   Follow-up with Dr. RChase Callerin 6 months and and as needed Please contact office for sooner follow up if symptoms do not improve or worsen or seek emergency care

## 2022-03-22 ENCOUNTER — Telehealth: Payer: Self-pay | Admitting: Family Medicine

## 2022-03-22 NOTE — Telephone Encounter (Signed)
Patient states: - She is returning call in regards to results  - She would like to know by at least the end of the day - She is experiencing sx related to her thyroid and wanted to discuss next steps   Patient can be reached at [phone].

## 2022-03-23 NOTE — Telephone Encounter (Signed)
Please see lab result notes

## 2022-03-27 ENCOUNTER — Other Ambulatory Visit (INDEPENDENT_AMBULATORY_CARE_PROVIDER_SITE_OTHER): Payer: BC Managed Care – PPO

## 2022-03-27 DIAGNOSIS — E039 Hypothyroidism, unspecified: Secondary | ICD-10-CM

## 2022-03-27 LAB — TSH: TSH: 0.59 u[IU]/mL (ref 0.35–5.50)

## 2022-03-28 ENCOUNTER — Ambulatory Visit: Payer: BC Managed Care – PPO | Admitting: Student

## 2022-03-28 NOTE — Progress Notes (Signed)
Please inform patient of the following:  Her thyroid level is at goal range.  Do not need to make any adjustments to medication at this time.  We can recheck in 3 to 6 months.

## 2022-03-28 NOTE — Telephone Encounter (Signed)
See results note. 

## 2022-03-29 ENCOUNTER — Ambulatory Visit: Payer: BC Managed Care – PPO | Admitting: Cardiology

## 2022-03-29 ENCOUNTER — Encounter: Payer: Self-pay | Admitting: Cardiology

## 2022-03-29 VITALS — BP 136/89 | HR 80 | Temp 98.0°F | Resp 16 | Ht 60.0 in | Wt 141.0 lb

## 2022-03-29 DIAGNOSIS — I700 Atherosclerosis of aorta: Secondary | ICD-10-CM

## 2022-03-29 DIAGNOSIS — E782 Mixed hyperlipidemia: Secondary | ICD-10-CM | POA: Diagnosis not present

## 2022-03-29 DIAGNOSIS — R072 Precordial pain: Secondary | ICD-10-CM | POA: Diagnosis not present

## 2022-03-29 DIAGNOSIS — R002 Palpitations: Secondary | ICD-10-CM | POA: Diagnosis not present

## 2022-03-29 MED ORDER — METOPROLOL TARTRATE 25 MG PO TABS: 25.0000 mg | ORAL_TABLET | Freq: Two times a day (BID) | ORAL | 2 refills | Status: DC

## 2022-03-29 NOTE — Progress Notes (Signed)
Patient referred by Danielle Barrack, MD for abnormal echocardiogram.  Subjective:   Danielle Bond, female    DOB: 02-23-1961, 61 y.o.   MRN: [id number]   No chief complaint on file.  61 y/o Caucasian female with hyperlipidemia, aortic and coronary atherrosclerosis, presyncope, Graves' disease, status post thyroidectomy, ulcerative colitis, h/o acute pancreatitis, migraine  Patient has recently had colitis flare ups, which since resolved. She had one episode of chest pain while walking outside, but has not had any other episodes.    Current Outpatient Medications:    albuterol (PROVENTIL) (2.5 MG/3ML) 0.083% nebulizer solution, Take 3 mLs (2.5 mg total) by nebulization every 6 (six) hours as needed for wheezing or shortness of breath., Disp: 150 mL, Rfl: 1   albuterol (VENTOLIN HFA) 108 (90 Base) MCG/ACT inhaler, TAKE 2 PUFFS BY MOUTH EVERY 6 HOURS AS NEEDED FOR WHEEZE OR SHORTNESS OF BREATH, Disp: 25.5 each, Rfl: 1   budesonide-formoterol (SYMBICORT) 160-4.5 MCG/ACT inhaler, Inhale 2 puffs into the lungs in the morning and at bedtime. (Patient not taking: Reported on 03/21/2022), Disp: 1 each, Rfl: 12   budesonide-formoterol (SYMBICORT) 160-4.5 MCG/ACT inhaler, Inhale 2 puffs into the lungs 2 (two) times daily., Disp: , Rfl:    cholecalciferol (VITAMIN D3) 25 MCG (1000 UNIT) tablet, Take 1,000 Units by mouth daily., Disp: , Rfl:    EPINEPHRINE 0.3 mg/0.3 mL IJ SOAJ injection, INJECT 0.3 MLS (0.3 MG TOTAL) INTO THE MUSCLE AS NEEDED FOR ANAPHYLAXIS. (Patient taking differently: Inject 0.3 mg into the muscle once as needed for anaphylaxis.), Disp: 2 each, Rfl: 1   fluticasone (FLONASE) 50 MCG/ACT nasal spray, PLACE 2 SPRAYS INTO BOTH NOSTRILS DAILY AS NEEDED FOR ALLERGIES OR RHINITIS., Disp: 48 mL, Rfl: 1   folic acid (FOLVITE) 1 MG tablet, TAKE 1 TABLET BY MOUTH EVERY DAY FOR 30 DAYS (Patient taking differently: Take 1 mg by mouth daily.), Disp: 90 tablet, Rfl: 1   Homeopathic Products  (SIMILASAN ALLERGY EYE RELIEF OP), Place 1 drop into both eyes daily as needed (allergy)., Disp: , Rfl:    HUMIRA PEN 40 MG/0.4ML PNKT, Inject 40 mg into the skin every 14 (fourteen) days., Disp: , Rfl:    Magnesium 500 MG TABS, Take 500 mg by mouth daily., Disp: , Rfl:    metoprolol tartrate (LOPRESSOR) 25 MG tablet, Take 1 tablet (25 mg total) by mouth daily., Disp: 180 tablet, Rfl: 1   Multiple Vitamin (MULTIVITAMIN WITH MINERALS) TABS tablet, Take 1 tablet by mouth daily., Disp: , Rfl:    pantoprazole (PROTONIX) 40 MG tablet, Take 40 mg by mouth daily., Disp: , Rfl:    RINVOQ 45 MG TB24, Take 1 tablet by mouth daily., Disp: , Rfl:    rosuvastatin (CRESTOR) 10 MG tablet, TAKE 1 TABLET BY MOUTH EVERY DAY (Patient taking differently: Take 10 mg by mouth daily.), Disp: 90 tablet, Rfl: 3   SYNTHROID 100 MCG tablet, Take 100 mcg by mouth every morning., Disp: , Rfl:    triamcinolone cream (KENALOG) 0.1 %, Apply 1 application topically daily as needed. (Patient taking differently: Apply 1 application  topically daily as needed (itching).), Disp: 453 g, Rfl: 6   valACYclovir (VALTREX) 500 MG tablet, Take 1 tablet by mouth daily as needed (breakouts)., Disp: , Rfl:    vitamin C (ASCORBIC ACID) 500 MG tablet, Take 500 mg by mouth daily., Disp: , Rfl:     Cardiovascular studies:  EKG 03/29/2022: Sinus rhythm Nonspecific T wave abnormality  Echocardiogram 11/09/2021:  Normal  LV systolic function with visual EF 60-65%. Left ventricle cavity  is normal in size. Normal left ventricular wall thickness. Normal global  wall motion. Normal diastolic filling pattern, normal LAP.  Mild (Grade I) mitral regurgitation.  Mild tricuspid regurgitation. No evidence of pulmonary hypertension.  Compared to 04/28/2019 mild MR / TR are new otherwise no significant  change.   Exercise Sestamibi stress test 01/09/2021: Exercise nuclear stress test was performed using Bruce protocol. Patient reached 7 METS, and 106% of  age predicted maximum heart rate. Exercise capacity was low. No chest pain reported. Heart rate and hemodynamic response were normal. Stress EKG revealed no ischemic changes. Normal myocardial perfusion. Stress LVEF 54%. Low risk study.  CTA chest 05/23/2018: No demonstrable pulmonary embolus. No thoracic aortic aneurysm or dissection. There is aortic atherosclerosis.  Right subclavian artery arises distally to the other great vessels, passing posteriorly to the esophagus and impressing upon the esophagus as it passes to the right.  No lung edema or consolidation.  No evident thoracic adenopathy. Aortic Atherosclerosis (ICD10-I70.0).  Recent labs: 01/30/2022: Glucose 255, BUN/Cr 14/0.84. EGFR 75. Na/K 136/4.3. Rest of the CMP normal H/H 11/34. MCV 90. Platelets 373 HbA1C 6.3% TSH 6.7 high  06/2021: Chol 209, TG 78, HDL 93, LDL 100  Review of Systems  Cardiovascular:  Negative for chest pain, dyspnea on exertion, leg swelling, palpitations and syncope.  Neurological:  Positive for dizziness.       Vitals:   03/29/22 1104 03/29/22 1117  BP: (!) 131/90 136/89  Pulse: 80 80  Resp: 16   Temp: 98 F (36.7 C)   SpO2: 99%      Body mass index is 27.54 kg/m. Filed Weights   03/29/22 1104  Weight: 141 lb (64 kg)     Objective:   Physical Exam Vitals and nursing note reviewed.  Constitutional:      General: She is not in acute distress. Neck:     Vascular: No JVD.  Cardiovascular:     Rate and Rhythm: Normal rate and regular rhythm.     Pulses: Intact distal pulses.     Heart sounds: Normal heart sounds. No murmur heard. Pulmonary:     Effort: Pulmonary effort is normal.     Breath sounds: Normal breath sounds. No wheezing or rales.           Assessment & Recommendations:   61 y/o Caucasian female with hyperlipidemia, aortic and coronary atherrosclerosis, presyncope, Graves' disease, status post thyroidectomy, ulcerative colitis, h/o acute pancreatitis,  migraine,  Hyperlipidemia: HDL 93, LDL 100 on Crestor Okay to continue the same. .   Aorta and coronary atherosclerosis: Minimal. Continue risk factor modification, especially lipid control. Not on Aspirin due to h/o GI bleeding with ulcerative colitis.   Chest pain: One episode could be angina. Stress test with no ischemia (2022). Increase metoprolol tartrate to 25 mg bid, which should also help hypertension.  F/u in 3 months    Nigel Mormon, MD Pager: [phone] Office: [phone]

## 2022-04-04 ENCOUNTER — Other Ambulatory Visit: Payer: Self-pay | Admitting: *Deleted

## 2022-04-04 ENCOUNTER — Telehealth: Payer: Self-pay | Admitting: Family Medicine

## 2022-04-04 MED ORDER — SYNTHROID 100 MCG PO TABS: 100.0000 ug | ORAL_TABLET | Freq: Every morning | ORAL | 1 refills | Status: DC

## 2022-04-04 NOTE — Telephone Encounter (Signed)
Patient states Dr. Chalmers Cater used to prescribe the following medication, however, Patient states she no longer sees Dr. Chalmers Cater and requests Dr. [name] prescribe the following medication (Patient states she had this conversation with Dr. [name] at her last visit)    LAST APPOINTMENT DATE:  Please schedule appointment if longer than 1 year  01/30/22  NEXT APPOINTMENT DATE:  07/20/22  MEDICATION:SYNTHROID 100 MCG tablet  Is the patient out of medication? No-approx-1 week  [facility]  [id number] [address]:  [phone] Fax:  [phone]    Let patient know to contact pharmacy at the end of the day to make sure medication is ready.  Please notify patient to allow 48-72 hours to process

## 2022-04-06 DIAGNOSIS — H6981 Other specified disorders of Eustachian tube, right ear: Secondary | ICD-10-CM | POA: Diagnosis not present

## 2022-04-06 DIAGNOSIS — M2669 Other specified disorders of temporomandibular joint: Secondary | ICD-10-CM | POA: Diagnosis not present

## 2022-04-06 DIAGNOSIS — H9201 Otalgia, right ear: Secondary | ICD-10-CM | POA: Diagnosis not present

## 2022-04-09 NOTE — Telephone Encounter (Signed)
Please advise 

## 2022-04-09 NOTE — Telephone Encounter (Signed)
Ok with me. Please place any necessary orders. 

## 2022-04-11 DIAGNOSIS — H9201 Otalgia, right ear: Secondary | ICD-10-CM | POA: Diagnosis not present

## 2022-04-11 DIAGNOSIS — K625 Hemorrhage of anus and rectum: Secondary | ICD-10-CM | POA: Diagnosis not present

## 2022-04-11 DIAGNOSIS — R197 Diarrhea, unspecified: Secondary | ICD-10-CM | POA: Diagnosis not present

## 2022-04-11 NOTE — Telephone Encounter (Signed)
Rx was sent to pharmacy on 04/04/22 according to patients chart.

## 2022-04-17 DIAGNOSIS — K5190 Ulcerative colitis, unspecified, without complications: Secondary | ICD-10-CM | POA: Diagnosis not present

## 2022-04-27 ENCOUNTER — Other Ambulatory Visit: Payer: Self-pay | Admitting: Family Medicine

## 2022-05-08 ENCOUNTER — Ambulatory Visit: Payer: BC Managed Care – PPO | Admitting: Physician Assistant

## 2022-05-11 ENCOUNTER — Telehealth: Payer: Self-pay | Admitting: Family Medicine

## 2022-05-11 NOTE — Telephone Encounter (Signed)
Please make sure she is aware. We can send in generic if needed.  [name]. [name], MD 05/11/2022 2:48 PM

## 2022-05-11 NOTE — Telephone Encounter (Signed)
PA denied and appeal denied Patient has to be allergic to generic to have brand approved.

## 2022-05-11 NOTE — Telephone Encounter (Signed)
Danielle Bond with [employer] called stating we need to process a PA through [employer] by calling [phone]. She has already spoken to them. Then she is asking for a call back to confirm at [phone].  MEDICATION: SYNTHROID 100 MCG tablet

## 2022-05-11 NOTE — Telephone Encounter (Signed)
LMOVM advising to return call

## 2022-05-11 NOTE — Telephone Encounter (Signed)
Patient returned call. Patient requests to be called at ph# [phone].  Patient states she only has 2 tablets left.

## 2022-05-14 ENCOUNTER — Other Ambulatory Visit: Payer: Self-pay

## 2022-05-14 NOTE — Telephone Encounter (Signed)
Same dosage?

## 2022-05-15 ENCOUNTER — Ambulatory Visit: Payer: BC Managed Care – PPO | Admitting: Internal Medicine

## 2022-05-15 ENCOUNTER — Other Ambulatory Visit: Payer: Self-pay | Admitting: *Deleted

## 2022-05-15 MED ORDER — LEVOTHYROXINE SODIUM 100 MCG PO TABS: 100.0000 ug | ORAL_TABLET | Freq: Every day | ORAL | 3 refills | Status: DC

## 2022-05-15 NOTE — Telephone Encounter (Signed)
Yes same dose.  [name]. [name], MD 05/15/2022 7:28 AM

## 2022-05-15 NOTE — Progress Notes (Deleted)
Office Visit Note  Patient: Danielle Bond             Date of Birth: [birth year]           MRN: [id number]             PCP: [name], MD Referring: [name], MD Visit Date: 05/15/2022 Occupation: @GUAROCC @  Subjective:  No chief complaint on file.   History of Present Illness: Danielle Bond is a 61 y.o. female here for ulcerative colitis associated inflammatory arthritis previously seeign Dr. Kathlene November with [facility] and on treatment with Humira. Apparent loss of efficacy despite humira titration to weekly dosing and negative antidrug antibody levels. She also has a history of autoimmune hepatitis per biopsy in 2014 and autoimmune pancreatitis involvement. Imaging ahd been negative for radiographic sacroiliitis changes but with excellent improvement of pain with right SI joint injection in April this year. Colonoscopy in July with Dr. Therisa Doyne demonstrated active bowel inflammation and started steroid treatment course for this.***   Activities of Daily Living:  Patient reports morning stiffness for *** {minute/hour:19697}.   Patient {ACTIONS;DENIES/REPORTS:21021675::"Denies"} nocturnal pain.  Difficulty dressing/grooming: {ACTIONS;DENIES/REPORTS:21021675::"Denies"} Difficulty climbing stairs: {ACTIONS;DENIES/REPORTS:21021675::"Denies"} Difficulty getting out of chair: {ACTIONS;DENIES/REPORTS:21021675::"Denies"} Difficulty using hands for taps, buttons, cutlery, and/or writing: {ACTIONS;DENIES/REPORTS:21021675::"Denies"}  No Rheumatology ROS completed.   PMFS History:  Patient Active Problem List   Diagnosis Date Noted   Palpitations 03/29/2022   Precordial pain 03/29/2022   Chronic rhinitis 03/21/2022   Onychomycosis 01/30/2022   Arthralgia 07/19/2021   Immunosuppressed status ([place]) 07/19/2021   Eustachian tube dysfunction 06/13/2021   Osteoporosis 07/11/2020   Atherosclerosis of native coronary artery of native heart without angina pectoris 06/08/2019   Aortic  atherosclerosis ([place]) 06/08/2019   Mixed hyperlipidemia 05/02/2019   Postural dizziness with presyncope 05/02/2019   HSV-2 (herpes simplex virus 2) infection 11/04/2018   Hypothyroidism s/p total thyroidectomy 1997 08/15/2018   Autoimmune hepatitis ([place]) 08/16/2012   Asthma 03/31/2012   Chronic ulcerative pancolitis ([place]) 03/31/2012   History of renal calculi 03/31/2012   Migraines 03/31/2012   Mitral valve prolapse     Past Medical History:  Diagnosis Date   Asthma    Chronic kidney disease    Hx of pancreatitis 06/16/2018   Kidney stone    Kidney stone    Migraine    Orthostatic hypotension    Osteopenia 03/31/2012   Recurrent upper respiratory infection (URI)    Ulcerative colitis ([place]) 03/31/2012   Urticaria     Family History  Problem Relation Age of Onset   Cervical cancer Paternal Aunt    Heart disease Paternal Grandmother    Heart disease Mother    Dementia Mother    Thyroid disease Mother    Osteoarthritis Mother    Asthma Mother    Heart attack Father    Breast cancer Cousin    Heart attack Brother    Heart disease Brother    Past Surgical History:  Procedure Laterality Date   BIOPSY  01/25/2022   Procedure: BIOPSY;  Surgeon: Ronnette Juniper, MD;  Location: [facility];  Service: Gastroenterology;;   CESAREAN SECTION     x3   CHOLECYSTECTOMY     COLONOSCOPY     COLONOSCOPY N/A 01/25/2022   Procedure: COLONOSCOPY;  Surgeon: Ronnette Juniper, MD;  Location: [facility];  Service: Gastroenterology;  Laterality: N/A;   LITHOTRIPSY     THYROIDECTOMY     1997   Social History   Social History Narrative   Lives  at home with her husband.   Right-handed.   No caffeine use.   Immunization History  Administered Date(s) Administered   Fluad Quad(high Dose 65+) 04/01/2021   Influenza Inj Mdck Quad Pf 03/23/2018   Influenza Inj Mdck Quad With Preservative 05/01/2019   Influenza Split 04/10/2012, 04/24/2013   Influenza, Seasonal, Injecte, Preservative Fre 04/15/2014    Influenza,inj,Quad PF,6+ Mos 03/23/2020   Influenza,inj,quad, With Preservative 05/02/2015   Influenza,trivalent, recombinat, inj, PF 04/10/2012, 04/24/2013   Influenza-Unspecified 04/22/2020   Moderna Covid-19 Vaccine Bivalent Booster 73yr & up 04/24/2021   Moderna Sars-Covid-2 Vaccination 10/09/2019, 11/09/2019, 05/16/2020, 11/13/2020   PPD Test 10/05/2013   Pneumococcal Polysaccharide-23 04/22/2008, 04/23/2013, 05/24/2020   Tdap 07/23/2004, 07/22/2012   Varicella 07/22/2012     Objective: Vital Signs: LMP 03/03/2013    Physical Exam   Musculoskeletal Exam: ***  CDAI Exam: CDAI Score: -- Patient Global: --; Provider Global: -- Swollen: --; Tender: -- Joint Exam 05/15/2022   No joint exam has been documented for this visit   There is currently no information documented on the homunculus. Go to the Rheumatology activity and complete the homunculus joint exam.  Investigation: No additional findings.  Imaging: No results found.  Recent Labs: Lab Results  Component Value Date   WBC 8.6 01/30/2022   HGB 11.5 (L) 01/30/2022   PLT 373.0 01/30/2022   NA 136 01/30/2022   K 4.3 01/30/2022   CL 101 01/30/2022   CO2 27 01/30/2022   GLUCOSE 255 (H) 01/30/2022   BUN 14 01/30/2022   CREATININE 0.84 01/30/2022   BILITOT 0.4 01/30/2022   ALKPHOS 60 01/30/2022   AST 26 01/30/2022   ALT 18 01/30/2022   PROT 7.4 01/30/2022   ALBUMIN 3.7 01/30/2022   CALCIUM 8.9 01/30/2022   GFRAA >60 03/03/2019   QFTBGOLDPLUS NEGATIVE 06/16/2018    Speciality Comments: No specialty comments available.  Procedures:  No procedures performed Allergies: Almond oil, Lialda [mesalamine], Other, Aspirin, Hydromorphone hcl, Penicillins, Acetaminophen, Apriso [mesalamine er], Corticosteroids, Imitrex [sumatriptan], Stadol [butorphanol], Topamax [topiramate], Vancomycin, Azathioprine, Pepcid [famotidine], and Prednisone   Assessment / Plan:     Visit Diagnoses: No diagnosis  found.  Orders: No orders of the defined types were placed in this encounter.  No orders of the defined types were placed in this encounter.   Face-to-face time spent with patient was *** minutes. Greater than 50% of time was spent in counseling and coordination of care.  Follow-Up Instructions: No follow-ups on file.   CCollier Salina MD  Note - This record has been created using [employer]  Chart creation errors have been sought, but may not always  have been located. Such creation errors do not reflect on  the standard of medical care.

## 2022-05-15 NOTE — Telephone Encounter (Signed)
Rx levothyroxine send to pharmacy  LVM to patient Rx send in

## 2022-05-18 DIAGNOSIS — K5190 Ulcerative colitis, unspecified, without complications: Secondary | ICD-10-CM | POA: Diagnosis not present

## 2022-05-28 ENCOUNTER — Other Ambulatory Visit (HOSPITAL_COMMUNITY): Payer: Self-pay

## 2022-05-28 MED ORDER — DIFICID 200 MG PO TABS
200.0000 mg | ORAL_TABLET | Freq: Two times a day (BID) | ORAL | 0 refills | Status: DC
  Filled 2022-05-28: qty 20, 10d supply, fill #0

## 2022-05-30 DIAGNOSIS — A0472 Enterocolitis due to Clostridium difficile, not specified as recurrent: Secondary | ICD-10-CM | POA: Diagnosis not present

## 2022-05-30 DIAGNOSIS — K51018 Ulcerative (chronic) pancolitis with other complication: Secondary | ICD-10-CM | POA: Diagnosis not present

## 2022-06-04 ENCOUNTER — Ambulatory Visit (INDEPENDENT_AMBULATORY_CARE_PROVIDER_SITE_OTHER): Payer: BC Managed Care – PPO | Admitting: Podiatry

## 2022-06-04 DIAGNOSIS — B351 Tinea unguium: Secondary | ICD-10-CM | POA: Diagnosis not present

## 2022-06-04 MED ORDER — CLOTRIMAZOLE-BETAMETHASONE 1-0.05 % EX CREA: 1.0000 | TOPICAL_CREAM | Freq: Two times a day (BID) | CUTANEOUS | 1 refills | Status: DC

## 2022-06-04 NOTE — Progress Notes (Addendum)
Chief Complaint  Patient presents with   Nail Problem    Patient  is here for discoloration on right foot great toe and pinky toe.    Subjective: 61 y.o. female presenting today as a new patient for evaluation of discoloration with thickening to the toenails of the right foot.  Patient states this has been ongoing for about 2 years.  She does have a history of liver pathology and autoimmune hepatitis so she is not a candidate for oral antifungal medication.  She presents for further treatment evaluation  Past Medical History:  Diagnosis Date   Asthma    Chronic kidney disease    Hx of pancreatitis 06/16/2018   Kidney stone    Kidney stone    Migraine    Orthostatic hypotension    Osteopenia 03/31/2012   Recurrent upper respiratory infection (URI)    Ulcerative colitis ([place]) 03/31/2012   Urticaria     Past Surgical History:  Procedure Laterality Date   BIOPSY  01/25/2022   Procedure: BIOPSY;  Surgeon: Ronnette Juniper, MD;  Location: [facility];  Service: Gastroenterology;;   CESAREAN SECTION     x3   CHOLECYSTECTOMY     COLONOSCOPY     COLONOSCOPY N/A 01/25/2022   Procedure: COLONOSCOPY;  Surgeon: Ronnette Juniper, MD;  Location: [facility];  Service: Gastroenterology;  Laterality: N/A;   LITHOTRIPSY     THYROIDECTOMY     1997    Allergies  Allergen Reactions   Almond Oil Hives and Shortness Of Breath   Lialda [Mesalamine] Other (See Comments)    Vomiting and rectal bleeding   Other Anaphylaxis    Almonds, tree nuts   Aspirin Hives and Nausea Only    Stomach cramps   Hydromorphone Hcl Other (See Comments)    Caused Enzyme levels to go up   Penicillins Hives and Swelling    Has patient had a PCN reaction causing immediate rash, facial/tongue/throat swelling, SOB or lightheadedness with hypotension: Yes Has patient had a PCN reaction causing severe rash involving mucus membranes or skin necrosis: No Has patient had a PCN reaction that required hospitalization: No Has  patient had a PCN reaction occurring within the last 10 years: No  Stomach cramps If all of the above answers are "NO", then may proceed with Cephalosporin use.    Acetaminophen Other (See Comments)    Has autoimmune hepatitis, wants to avoid APAP   Apriso [Mesalamine Er] Nausea And Vomiting   Corticosteroids Other (See Comments)    Had acute pancreatitis x 2 after steroid use.   Imitrex [Sumatriptan] Swelling    Injection caused swelling   Stadol [Butorphanol] Other (See Comments)    hallucinates   Topamax [Topiramate] Other (See Comments)    Kidney stones   Vancomycin Hives   Azathioprine Rash   Pepcid [Famotidine] Nausea And Vomiting    Makes acid worse   Prednisone Nausea And Vomiting    All steroids causes nausea and acute pancreatitis     Objective: Physical Exam General: The patient is alert and oriented x3 in no acute distress.  Dermatology: Hyperkeratotic, discolored, thickened, onychodystrophy noted to the right foot. Skin is warm, dry and supple bilateral lower extremities.  There is some inflammatory dermatitis also noted to the weightbearing surfaces of the lateral aspect of the right foot concerning for possible tinea pedis  Vascular: Palpable pedal pulses bilaterally. No edema or erythema noted. Capillary refill within normal limits.  Neurological: Epicritic and protective threshold grossly intact bilaterally.  Musculoskeletal Exam: No pedal deformity noted  Assessment: #1 Onychomycosis of toenails #2 tinea pedis right  Plan of Care:  #1 Patient was evaluated. #2  Today we discussed different treatment options including oral, topical, and laser antifungal treatment modalities.  We discussed their efficacies and side effects.  Unfortunately the patient is not a candidate for oral antifungal medication #3 Tolcylen antifungal topical dispensed at checkout.  Apply daily #4 appointment for antifungal laser treatment monthly here in our office #5 prescription  for Lotrisone cream apply 2 times daily #6 return to clinic 6 months  *Aesthetician  Edrick Kins, DPM [facility]  Dr. Edrick Kins, DPM    [address], [place] [address]                Office [phone]  Fax [phone]

## 2022-06-06 ENCOUNTER — Ambulatory Visit (INDEPENDENT_AMBULATORY_CARE_PROVIDER_SITE_OTHER): Payer: BC Managed Care – PPO | Admitting: Family

## 2022-06-06 ENCOUNTER — Encounter: Payer: Self-pay | Admitting: Family

## 2022-06-06 DIAGNOSIS — M25812 Other specified joint disorders, left shoulder: Secondary | ICD-10-CM | POA: Diagnosis not present

## 2022-06-06 MED ORDER — METHYLPREDNISOLONE ACETATE 40 MG/ML IJ SUSP
20.0000 mg | INTRAMUSCULAR | Status: AC | PRN
  Administered 2022-06-06: 20 mg via INTRA_ARTICULAR

## 2022-06-06 MED ORDER — LIDOCAINE HCL 1 % IJ SOLN
3.0000 mL | INTRAMUSCULAR | Status: AC | PRN
  Administered 2022-06-06: 3 mL

## 2022-06-06 NOTE — Progress Notes (Signed)
Office Visit Note   Patient: Danielle Bond           Date of Birth: [birth year]           MRN: [id number] Visit Date: 06/06/2022              Requested by: [name], MD [address] [place],  [place] [address] PCP: [name], MD  Chief Complaint  Patient presents with   Left Shoulder - Pain      HPI: Is a 61 year old woman who returns today complaining of return of her left shoulder symptoms.  She has been having aching throbbing pain this heaviness some loss of range of motion pain reaching above head or behind back no recent injury this has been gradually returning  This has occurred in the past last episode resolved well with the Depo-Medrol injection  Assessment & Plan: Visit Diagnoses: No diagnosis found.  Plan: Depo-Medrol injection left shoulder.  Patient tolerated well.  She will follow-up as needed.  Follow-Up Instructions: No follow-ups on file.   Left Shoulder Exam   Tenderness  The patient is experiencing tenderness in the biceps tendon.  Range of Motion  Active abduction:  abnormal  Passive abduction:  normal  External rotation:  abnormal   Muscle Strength  The patient has normal left shoulder strength.  Tests  Impingement: positive Drop arm: negative  Other  Sensation: normal       Patient is alert, oriented, no adenopathy, well-dressed, normal affect, normal respiratory effort.   Imaging: No results found. No images are attached to the encounter.  Labs: Lab Results  Component Value Date   HGBA1C 6.3 03/15/2022   HGBA1C 6.2 04/04/2021   ESRSEDRATE 94 (H) 01/24/2022   ESRSEDRATE 53 (H) 08/21/2021   ESRSEDRATE 64 (H) 07/09/2019   CRP 4.4 (H) 01/24/2022   CRP 0.8 08/21/2021   CRP 85.3 (H) 06/26/2018   REPTSTATUS 03/12/2014 FINAL 03/10/2014   CULT  03/10/2014    Multiple bacterial morphotypes present, none predominant. Suggest appropriate recollection if clinically indicated. Performed at [facility] 03/22/2014     Lab Results  Component Value Date   ALBUMIN 3.7 01/30/2022   ALBUMIN 2.8 (L) 01/25/2022   ALBUMIN 3.5 01/24/2022    Lab Results  Component Value Date   MG 2.1 07/19/2021   MG 1.6 08/15/2012   Lab Results  Component Value Date   VD25OH 48.99 09/13/2021   VD25OH 41.05 04/04/2021    No results found for: "PREALBUMIN"    Latest Ref Rng & Units 01/30/2022    3:38 PM 01/25/2022    3:31 AM 01/24/2022   10:40 PM  CBC EXTENDED  WBC 4.0 - 10.5 K/uL 8.6  8.4    RBC 3.87 - 5.11 Mil/uL 3.86  3.49    Hemoglobin 12.0 - 15.0 g/dL 11.5  10.1  12.2   HCT 36.0 - 46.0 % 34.8  31.9  38.1   Platelets 150.0 - 400.0 K/uL 373.0  309       There is no height or weight on file to calculate BMI.  Orders:  No orders of the defined types were placed in this encounter.  No orders of the defined types were placed in this encounter.    Procedures: Large Joint Inj: L subacromial bursa on 06/06/2022 9:26 AM Indications: pain Details: 22 G 1.5 in needle Medications: 3 mL lidocaine 1 %; 20 mg methylPREDNISolone acetate 40 MG/ML Consent was given  by the patient.      Clinical Data: No additional findings.  ROS:  All other systems negative, except as noted in the HPI. Review of Systems  Constitutional:  Negative for chills and fever.  Musculoskeletal:  Positive for arthralgias and myalgias.  Neurological:  Positive for weakness. Negative for numbness.    Objective: Vital Signs: LMP 03/03/2013   Specialty Comments:  MRI lumbar spine:   INDICATION: Right lower back pain   TECHNIQUE: Sagittal and axial T1 and T2-weighted sequences were performed. Additional sagittal STIR images were performed.   COMPARISON: None available   FINDINGS:  #  Vertebral bodies: No compression fracture.  #  Alignment: Normal.  #  Marrow signal: No significant abnormality.  #  Conus medullaris: Normal. Terminates at T12-L1 with no evidence of tethering.   #  T12-L1 mild  bulging of the disc into the small disc protrusion on the left with mild impingement on the thecal sac.  #  L1-2: Mild disc desiccation.  #  L2-3: Mild bulging of the disc.  #  L3-4: Mild bulging of the discs.  #  L4-5: Mild bulging of the disc. Mild facet joint arthritis.  #  L5-S1: Mild bulging of the disc. Mild facet joint arthritis.    IMPRESSION:  Mild spondylosis and facet joint arthritis.  Mild bulging disc at several levels.  No herniated disc, spinal stenosis, or foraminal stenosis.   Electronically Signed by: Ross Marcus on 11/09/2020  PMFS History: Patient Active Problem List   Diagnosis Date Noted   Palpitations 03/29/2022   Precordial pain 03/29/2022   Chronic rhinitis 03/21/2022   Onychomycosis 01/30/2022   Arthralgia 07/19/2021   Immunosuppressed status ([place]) 07/19/2021   Eustachian tube dysfunction 06/13/2021   Osteoporosis 07/11/2020   Atherosclerosis of native coronary artery of native heart without angina pectoris 06/08/2019   Aortic atherosclerosis ([place]) 06/08/2019   Mixed hyperlipidemia 05/02/2019   Postural dizziness with presyncope 05/02/2019   HSV-2 (herpes simplex virus 2) infection 11/04/2018   Hypothyroidism s/p total thyroidectomy 1997 08/15/2018   Autoimmune hepatitis ([place]) 08/16/2012   Asthma 03/31/2012   Chronic ulcerative pancolitis ([place]) 03/31/2012   History of renal calculi 03/31/2012   Migraines 03/31/2012   Mitral valve prolapse    Past Medical History:  Diagnosis Date   Asthma    Chronic kidney disease    Hx of pancreatitis 06/16/2018   Kidney stone    Kidney stone    Migraine    Orthostatic hypotension    Osteopenia 03/31/2012   Recurrent upper respiratory infection (URI)    Ulcerative colitis ([place]) 03/31/2012   Urticaria     Family History  Problem Relation Age of Onset   Cervical cancer Paternal Aunt    Heart disease Paternal Grandmother    Heart disease Mother    Dementia Mother    Thyroid disease Mother     Osteoarthritis Mother    Asthma Mother    Heart attack Father    Breast cancer Cousin    Heart attack Brother    Heart disease Brother     Past Surgical History:  Procedure Laterality Date   BIOPSY  01/25/2022   Procedure: BIOPSY;  Surgeon: Ronnette Juniper, MD;  Location: [facility];  Service: Gastroenterology;;   CESAREAN SECTION     x3   CHOLECYSTECTOMY     COLONOSCOPY     COLONOSCOPY N/A 01/25/2022   Procedure: COLONOSCOPY;  Surgeon: Ronnette Juniper, MD;  Location: [facility];  Service: Gastroenterology;  Laterality: N/A;  LITHOTRIPSY     THYROIDECTOMY     1997   Social History   Occupational History   Occupation: Self-employed  Tobacco Use   Smoking status: Never   Smokeless tobacco: Never  Vaping Use   Vaping Use: Never used  Substance and Sexual Activity   Alcohol use: No   Drug use: No   Sexual activity: Yes

## 2022-06-22 ENCOUNTER — Other Ambulatory Visit: Payer: Self-pay | Admitting: Adult Health

## 2022-06-28 ENCOUNTER — Ambulatory Visit (INDEPENDENT_AMBULATORY_CARE_PROVIDER_SITE_OTHER): Payer: BC Managed Care – PPO | Admitting: Family Medicine

## 2022-06-28 ENCOUNTER — Encounter: Payer: Self-pay | Admitting: Family Medicine

## 2022-06-28 VITALS — BP 118/81 | HR 70 | Temp 97.8°F | Ht 60.0 in | Wt 145.0 lb

## 2022-06-28 DIAGNOSIS — E782 Mixed hyperlipidemia: Secondary | ICD-10-CM | POA: Diagnosis not present

## 2022-06-28 DIAGNOSIS — E039 Hypothyroidism, unspecified: Secondary | ICD-10-CM | POA: Diagnosis not present

## 2022-06-28 DIAGNOSIS — R739 Hyperglycemia, unspecified: Secondary | ICD-10-CM

## 2022-06-28 DIAGNOSIS — K51011 Ulcerative (chronic) pancolitis with rectal bleeding: Secondary | ICD-10-CM

## 2022-06-28 DIAGNOSIS — R5383 Other fatigue: Secondary | ICD-10-CM

## 2022-06-28 DIAGNOSIS — N390 Urinary tract infection, site not specified: Secondary | ICD-10-CM | POA: Diagnosis not present

## 2022-06-28 DIAGNOSIS — B351 Tinea unguium: Secondary | ICD-10-CM

## 2022-06-28 LAB — URINALYSIS, ROUTINE W REFLEX MICROSCOPIC
Bilirubin Urine: NEGATIVE
Hgb urine dipstick: NEGATIVE
Ketones, ur: NEGATIVE
Leukocytes,Ua: NEGATIVE
Nitrite: NEGATIVE
RBC / HPF: NONE SEEN (ref 0–?)
Specific Gravity, Urine: 1.02 (ref 1.000–1.030)
Total Protein, Urine: NEGATIVE
Urine Glucose: NEGATIVE
Urobilinogen, UA: 0.2 (ref 0.0–1.0)
pH: 6 (ref 5.0–8.0)

## 2022-06-28 LAB — CBC
HCT: 36.5 % (ref 36.0–46.0)
Hemoglobin: 12.3 g/dL (ref 12.0–15.0)
MCHC: 33.8 g/dL (ref 30.0–36.0)
MCV: 92.2 fl (ref 78.0–100.0)
Platelets: 279 10*3/uL (ref 150.0–400.0)
RBC: 3.96 Mil/uL (ref 3.87–5.11)
RDW: 14.4 % (ref 11.5–15.5)
WBC: 5.4 10*3/uL (ref 4.0–10.5)

## 2022-06-28 LAB — IBC + FERRITIN
Ferritin: 30.7 ng/mL (ref 10.0–291.0)
Iron: 95 ug/dL (ref 42–145)
Saturation Ratios: 24.9 % (ref 20.0–50.0)
TIBC: 380.8 ug/dL (ref 250.0–450.0)
Transferrin: 272 mg/dL (ref 212.0–360.0)

## 2022-06-28 LAB — COMPREHENSIVE METABOLIC PANEL
ALT: 20 U/L (ref 0–35)
AST: 28 U/L (ref 0–37)
Albumin: 4.5 g/dL (ref 3.5–5.2)
Alkaline Phosphatase: 50 U/L (ref 39–117)
BUN: 16 mg/dL (ref 6–23)
CO2: 30 mEq/L (ref 19–32)
Calcium: 9 mg/dL (ref 8.4–10.5)
Chloride: 103 mEq/L (ref 96–112)
Creatinine, Ser: 0.8 mg/dL (ref 0.40–1.20)
GFR: 79.4 mL/min (ref >60.00)
Glucose, Bld: 114 mg/dL — ABNORMAL HIGH (ref 70–99)
Potassium: 4.4 mEq/L (ref 3.5–5.1)
Sodium: 138 mEq/L (ref 135–145)
Total Bilirubin: 0.7 mg/dL (ref 0.2–1.2)
Total Protein: 7.4 g/dL (ref 6.0–8.3)

## 2022-06-28 LAB — LIPID PANEL
Cholesterol: 244 mg/dL — ABNORMAL HIGH (ref 0–200)
HDL: 92.6 mg/dL (ref >39.00)
LDL Cholesterol: 136 mg/dL — ABNORMAL HIGH (ref 0–99)
NonHDL: 151.55
Total CHOL/HDL Ratio: 3
Triglycerides: 80 mg/dL (ref 0.0–149.0)
VLDL: 16 mg/dL (ref 0.0–40.0)

## 2022-06-28 LAB — FOLATE: Folate: >23.8 ng/mL (ref >5.9)

## 2022-06-28 LAB — MAGNESIUM: Magnesium: 2 mg/dL (ref 1.5–2.5)

## 2022-06-28 LAB — HEMOGLOBIN A1C: Hgb A1c MFr Bld: 5.9 % (ref 4.6–6.5)

## 2022-06-28 LAB — TSH: TSH: 0.99 u[IU]/mL (ref 0.35–5.50)

## 2022-06-28 LAB — VITAMIN D 25 HYDROXY (VIT D DEFICIENCY, FRACTURES): VITD: 32.03 ng/mL (ref 30.00–100.00)

## 2022-06-28 LAB — VITAMIN B12: Vitamin B-12: 662 pg/mL (ref 211–911)

## 2022-06-28 MED ORDER — OFLOXACIN 0.3 % OT SOLN: 5.0000 [drp] | Freq: Every day | OTIC | 0 refills | Status: DC

## 2022-06-28 NOTE — Assessment & Plan Note (Addendum)
We will check lipids today.  She is on Crestor 10 mg daily though does note that the Rinvoq has been increasing her levels.

## 2022-06-28 NOTE — Assessment & Plan Note (Signed)
Following with podiatry for this.  Doing better with topical treatment.

## 2022-06-28 NOTE — Patient Instructions (Signed)
It was very nice to see you today!  We will check blood work and a urine sample to look for any causes of your fatigue.  You have an ear infection in the outer part of your ear.  Please start the drops.  Follow-up with your ears and throat doctor if not improving.  Take care, Dr [name]  PLEASE NOTE:  If you had any lab tests please let us know if you have not heard back within a few days. You may see your results on mychart before we have a chance to review them but we will give you a call once they are reviewed by [name]. If we ordered any referrals today, please let us know if you have not heard from their office within the next week.   Please try these tips to maintain a healthy lifestyle:  Eat at least 3 REAL meals and 1-2 snacks per day.  Aim for no more than 5 hours between eating.  If you eat breakfast, please do so within one hour of getting up.   Each meal should contain half fruits/vegetables, one quarter protein, and one quarter carbs (no bigger than a computer mouse)  Cut down on sweet beverages. This includes juice, soda, and sweet tea.   Drink at least 1 glass of water with each meal and aim for at least 8 glasses per day  Exercise at least 150 minutes every week.

## 2022-06-28 NOTE — Progress Notes (Signed)
   Danielle Bond is a 61 y.o. female who presents today for an office visit.  Assessment/Plan:  New/Acute Problems: Other Fatigue Symptoms are nonspecific.  No red flag signs or symptoms.  This may be sequela of her C. difficile infection though we will check labs today including CBC, c-Met, TSH, A1c.,  Folate, B12, vitamin D, UA, and urine culture.  Otitis Externa  She has had bad reaction to Ciprodex in the past but does not remember specifically.  Thinks that it was a yeast infection develop.  Will start ofloxacin drops.  Advised her to follow-up with ENT if not improving.  C. difficile infection She is finishing her course of Flagyl per GI and will be following up with them next week.  Her symptoms have resolved.  No red flag signs or symptoms today.  Chronic Problems Addressed Today: Chronic ulcerative pancolitis ([place]) Continue management per GI.  If she is on rinvoq.   Hypothyroidism s/p total thyroidectomy 1997 Check TSH today.  She is on Synthroid 100 mcg daily.  Could be contributing to her fatigue.  Mixed hyperlipidemia We will check lipids today.  She is on Crestor 10 mg daily though does note that the Rinvoq has been increasing her levels.  Onychomycosis Following with podiatry for this.  Doing better with topical treatment.      Subjective:  HPI:  See A/p for status of chronic conditions.    Since our last visit she has been having more issues with her bowels. She contacted her GI doctor and had a stool sample which showed c diff. Shewas started on an antibiotic for this and had an allergic reaction. This was switched to flagyl. She completed her course of flagyl for this but repeat testing showed that she had still had c diff.  She was initially having some diarrhea and abdominal pain with low grade fever but this has mostly resolved at this point.   Her pain concern today is she is having significant amounts of fatigue. This has been worsening the last few weeks.  She was started on rinvoq by GI and this is causing some elevation in her cholesterol. She has been off steroids for the last 3-4 months.  No other symptoms.  She has also been having some pain in her right ear last several days.  No specific treatment tried.  Feels like previous ear infections.  No fevers or chills.       Objective:  Physical Exam: BP 118/81   Pulse 70   Temp 97.8 F (36.6 C) (Temporal)   Ht 5' (1.524 m)   Wt 145 lb (65.8 kg)   LMP 03/03/2013   SpO2 99%   BMI 28.32 kg/m   Wt Readings from Last 3 Encounters:  06/28/22 145 lb (65.8 kg)  03/29/22 141 lb (64 kg)  03/21/22 138 lb 6.4 oz (62.8 kg)    Gen: No acute distress, resting comfortably HEENT: Left EAC and TM clear.  Right EAC with erythema and small amount of purulent debris. CV: Regular rate and rhythm with no murmurs appreciated Pulm: Normal work of breathing, clear to auscultation bilaterally with no crackles, wheezes, or rhonchi Neuro: Grossly normal, moves all extremities Psych: Normal affect and thought content      Ancel Easler M. [name], MD 06/28/2022 11:49 AM

## 2022-06-28 NOTE — Assessment & Plan Note (Signed)
Continue management per GI.  If she is on rinvoq.

## 2022-06-28 NOTE — Assessment & Plan Note (Signed)
Check TSH today.  She is on Synthroid 100 mcg daily.  Could be contributing to her fatigue.

## 2022-06-29 ENCOUNTER — Encounter: Payer: Self-pay | Admitting: Cardiology

## 2022-06-29 ENCOUNTER — Ambulatory Visit: Payer: BC Managed Care – PPO | Admitting: Cardiology

## 2022-06-29 ENCOUNTER — Other Ambulatory Visit: Payer: BC Managed Care – PPO

## 2022-06-29 VITALS — BP 149/90 | HR 97 | Resp 16 | Ht 60.0 in | Wt 145.0 lb

## 2022-06-29 DIAGNOSIS — R002 Palpitations: Secondary | ICD-10-CM | POA: Diagnosis not present

## 2022-06-29 DIAGNOSIS — E782 Mixed hyperlipidemia: Secondary | ICD-10-CM

## 2022-06-29 DIAGNOSIS — R072 Precordial pain: Secondary | ICD-10-CM | POA: Diagnosis not present

## 2022-06-29 DIAGNOSIS — I700 Atherosclerosis of aorta: Secondary | ICD-10-CM

## 2022-06-29 LAB — URINE CULTURE
MICRO NUMBER:: 14284487
Result:: NO GROWTH
SPECIMEN QUALITY:: ADEQUATE

## 2022-06-29 NOTE — Addendum Note (Signed)
Addended by: Nigel Mormon on: 06/29/2022 04:05 PM   Modules accepted: Level of Service

## 2022-06-29 NOTE — Progress Notes (Signed)
Patient referred by [name], MD for abnormal echocardiogram.  Subjective:   Danielle Bond, female    DOB: 1960-09-02, 61 y.o.   MRN: [id number]   Chief Complaint  Patient presents with   Palpitations   Follow-up    3 month   61 y/o Caucasian female with hyperlipidemia, aortic and coronary atherrosclerosis, presyncope, Graves' disease, status post thyroidectomy, ulcerative colitis, h/o acute pancreatitis, migraine  Patient is recovering from colitis flare ups as well as C.diff infection. She denies any chest pain. She has recently had palpitations and rather random episodes of shortness of breath. Shortness of breath is not reproduced by physical exertion. Blood pressure elevated today, but that is unusual for the patient.    Current Outpatient Medications:    albuterol (PROVENTIL) (2.5 MG/3ML) 0.083% nebulizer solution, Take 3 mLs (2.5 mg total) by nebulization every 6 (six) hours as needed for wheezing or shortness of breath., Disp: 150 mL, Rfl: 1   albuterol (VENTOLIN HFA) 108 (90 Base) MCG/ACT inhaler, TAKE 2 PUFFS BY MOUTH EVERY 6 HOURS AS NEEDED FOR WHEEZE OR SHORTNESS OF BREATH, Disp: 25.5 each, Rfl: 1   budesonide-formoterol (SYMBICORT) 160-4.5 MCG/ACT inhaler, Inhale 2 puffs into the lungs 2 (two) times daily., Disp: , Rfl:    cholecalciferol (VITAMIN D3) 25 MCG (1000 UNIT) tablet, Take 1,000 Units by mouth daily., Disp: , Rfl:    clotrimazole-betamethasone (LOTRISONE) cream, Apply 1 Application topically 2 (two) times daily., Disp: 45 g, Rfl: 1   EPINEPHRINE 0.3 mg/0.3 mL IJ SOAJ injection, INJECT 0.3 MLS (0.3 MG TOTAL) INTO THE MUSCLE AS NEEDED FOR ANAPHYLAXIS. (Patient taking differently: Inject 0.3 mg into the muscle once as needed for anaphylaxis.), Disp: 2 each, Rfl: 1   fluticasone (FLONASE) 50 MCG/ACT nasal spray, PLACE 2 SPRAYS INTO BOTH NOSTRILS DAILY AS NEEDED FOR ALLERGIES OR RHINITIS., Disp: 48 mL, Rfl: 1   folic acid (FOLVITE) 1 MG tablet, TAKE 1 TABLET  BY MOUTH EVERY DAY FOR 30 DAYS (Patient taking differently: Take 1 mg by mouth daily.), Disp: 90 tablet, Rfl: 1   Homeopathic Products (SIMILASAN ALLERGY EYE RELIEF OP), Place 1 drop into both eyes daily as needed (allergy)., Disp: , Rfl:    levothyroxine (SYNTHROID) 100 MCG tablet, Take 1 tablet (100 mcg total) by mouth daily., Disp: 90 tablet, Rfl: 3   Magnesium 500 MG TABS, Take 500 mg by mouth daily., Disp: , Rfl:    metoprolol tartrate (LOPRESSOR) 25 MG tablet, Take 1 tablet (25 mg total) by mouth 2 (two) times daily., Disp: 180 tablet, Rfl: 2   Multiple Vitamin (MULTIVITAMIN WITH MINERALS) TABS tablet, Take 1 tablet by mouth daily., Disp: , Rfl:    ofloxacin (FLOXIN) 0.3 % OTIC solution, Place 5 drops into the left ear daily., Disp: 5 mL, Rfl: 0   pantoprazole (PROTONIX) 40 MG tablet, Take 40 mg by mouth daily., Disp: , Rfl:    RINVOQ 45 MG TB24, Take 1 tablet by mouth daily., Disp: , Rfl:    rosuvastatin (CRESTOR) 10 MG tablet, TAKE 1 TABLET BY MOUTH EVERY DAY (Patient taking differently: Take 10 mg by mouth daily.), Disp: 90 tablet, Rfl: 3   SYMBICORT 160-4.5 MCG/ACT inhaler, INHALE 2 PUFFS INTO THE LUNGS IN THE MORNING AND AT BEDTIME., Disp: 30.6 each, Rfl: 4   triamcinolone cream (KENALOG) 0.1 %, Apply 1 application topically daily as needed. (Patient taking differently: Apply 1 application  topically daily as needed (itching).), Disp: 453 g, Rfl: 6   valACYclovir (VALTREX)  500 MG tablet, Take 1 tablet by mouth daily as needed (breakouts)., Disp: , Rfl:    vitamin C (ASCORBIC ACID) 500 MG tablet, Take 500 mg by mouth daily., Disp: , Rfl:     Cardiovascular studies:  EKG 03/29/2022: Sinus rhythm Nonspecific T wave abnormality  Echocardiogram 11/09/2021:  Normal LV systolic function with visual EF 60-65%. Left ventricle cavity  is normal in size. Normal left ventricular wall thickness. Normal global  wall motion. Normal diastolic filling pattern, normal LAP.  Mild (Grade I) mitral  regurgitation.  Mild tricuspid regurgitation. No evidence of pulmonary hypertension.  Compared to 04/28/2019 mild MR / TR are new otherwise no significant  change.   Exercise Sestamibi stress test 01/09/2021: Exercise nuclear stress test was performed using Bruce protocol. Patient reached 7 METS, and 106% of age predicted maximum heart rate. Exercise capacity was low. No chest pain reported. Heart rate and hemodynamic response were normal. Stress EKG revealed no ischemic changes. Normal myocardial perfusion. Stress LVEF 54%. Low risk study.  CTA chest 05/23/2018: No demonstrable pulmonary embolus. No thoracic aortic aneurysm or dissection. There is aortic atherosclerosis.  Right subclavian artery arises distally to the other great vessels, passing posteriorly to the esophagus and impressing upon the esophagus as it passes to the right.  No lung edema or consolidation.  No evident thoracic adenopathy. Aortic Atherosclerosis (ICD10-I70.0).  Recent labs: 01/30/2022: Glucose 255, BUN/Cr 14/0.84. EGFR 75. Na/K 136/4.3. Rest of the CMP normal H/H 11/34. MCV 90. Platelets 373 HbA1C 6.3% TSH 6.7 high  06/2021: Chol 209, TG 78, HDL 93, LDL 100  Review of Systems  Cardiovascular:  Negative for chest pain, dyspnea on exertion, leg swelling, palpitations and syncope.  Neurological:  Positive for dizziness.       Vitals:   06/29/22 1141 06/29/22 1147  BP: (!) 147/91 (!) 149/90  Pulse: 100 97  Resp: 16   SpO2: 99% 99%     Body mass index is 28.32 kg/m. Filed Weights   06/29/22 1141  Weight: 145 lb (65.8 kg)     Objective:   Physical Exam Vitals and nursing note reviewed.  Constitutional:      General: She is not in acute distress. Neck:     Vascular: No JVD.  Cardiovascular:     Rate and Rhythm: Normal rate and regular rhythm.     Pulses: Intact distal pulses.     Heart sounds: Normal heart sounds. No murmur heard. Pulmonary:     Effort: Pulmonary effort is normal.      Breath sounds: Normal breath sounds. No wheezing or rales.           Assessment & Recommendations:   61 y/o Caucasian female with hyperlipidemia, aortic and coronary atherrosclerosis, presyncope, Graves' disease, status post thyroidectomy, ulcerative colitis, h/o acute pancreatitis, migraine,  Hyperlipidemia: HDL 93, LDL 100 on Crestor Okay to continue the same. .   Aorta and coronary atherosclerosis: Minimal. Continue risk factor modification, especially lipid control. Not on Aspirin due to h/o GI bleeding with ulcerative colitis.   Chest pain: No recurrence.  Palpitations: Recommend two week cardiac telemetry.  Elevated blood pressure without diagnosis of hypertension: New finding. Monitor blood pressure regularly at home.   If SBP remains >140 mmHg, she may need antihypertensive therapy. Consider amlodipine 5 mg daily or HCTZ 25 mg daily, unless any significant arrhythmia found on cardiac telemetry. In that case, will add beta blocker.   F/u in 3 months    Nigel Mormon, MD Pager: [phone]  Office: [phone]

## 2022-07-02 ENCOUNTER — Ambulatory Visit: Payer: BC Managed Care – PPO

## 2022-07-02 NOTE — Progress Notes (Signed)
Please inform patient of the following:  Labs are all stable.  No obvious reasons for her fatigue on her blood work.  Her lipids were up a little bit but she is already seen cardiology for this.  Do not need to make any other adjustments to her treatment plan at this time.  It is possible that her fatigue could be coming from her recent infections with the C. difficile as well as her ear infection.  Would like for her to let us know if symptoms or not improving over the next couple of weeks.

## 2022-07-04 ENCOUNTER — Telehealth: Payer: Self-pay | Admitting: Family Medicine

## 2022-07-04 NOTE — Telephone Encounter (Signed)
Patient requests to be called at ph# [phone] to be given Lab results  Patient states she does not use MyChart

## 2022-07-05 ENCOUNTER — Other Ambulatory Visit: Payer: Self-pay | Admitting: *Deleted

## 2022-07-05 NOTE — Telephone Encounter (Signed)
See results note. 

## 2022-07-06 DIAGNOSIS — M2669 Other specified disorders of temporomandibular joint: Secondary | ICD-10-CM | POA: Diagnosis not present

## 2022-07-06 DIAGNOSIS — R040 Epistaxis: Secondary | ICD-10-CM | POA: Diagnosis not present

## 2022-07-06 DIAGNOSIS — H9201 Otalgia, right ear: Secondary | ICD-10-CM | POA: Diagnosis not present

## 2022-07-06 DIAGNOSIS — H6991 Unspecified Eustachian tube disorder, right ear: Secondary | ICD-10-CM | POA: Diagnosis not present

## 2022-07-13 ENCOUNTER — Other Ambulatory Visit: Payer: Self-pay | Admitting: Family Medicine

## 2022-07-13 DIAGNOSIS — K5190 Ulcerative colitis, unspecified, without complications: Secondary | ICD-10-CM | POA: Diagnosis not present

## 2022-07-18 DIAGNOSIS — K5190 Ulcerative colitis, unspecified, without complications: Secondary | ICD-10-CM | POA: Diagnosis not present

## 2022-07-20 ENCOUNTER — Encounter: Payer: BC Managed Care – PPO | Admitting: Family Medicine

## 2022-07-26 ENCOUNTER — Encounter: Payer: BC Managed Care – PPO | Admitting: Family Medicine

## 2022-08-03 ENCOUNTER — Telehealth: Payer: Self-pay

## 2022-08-03 NOTE — Telephone Encounter (Signed)
Reassuring monitor wth only occasional ectopy. Given that palpitations are persistent, recommend increasing metoprolol tartrate from 25 mg bid to 50 mg bid.  Thanks MJP

## 2022-08-03 NOTE — Telephone Encounter (Signed)
Called patient to inform her about her monitor . Per Dr. Virgina Jock patient should increase on Metoprolol instead of 25 mg BID she should take 50 mg due that patient mention she was still having tachy. Patient mention she does not want to increase the Metoprolol due that it upsets her stomach. Patient will make an earlier appt. To be seen

## 2022-08-03 NOTE — Telephone Encounter (Signed)
Patient called to see if you could please read her monitor and let me know so I can give her her result

## 2022-08-07 ENCOUNTER — Encounter: Payer: Self-pay | Admitting: Adult Health

## 2022-08-07 ENCOUNTER — Ambulatory Visit (INDEPENDENT_AMBULATORY_CARE_PROVIDER_SITE_OTHER): Payer: BC Managed Care – PPO

## 2022-08-07 ENCOUNTER — Ambulatory Visit (INDEPENDENT_AMBULATORY_CARE_PROVIDER_SITE_OTHER): Payer: BC Managed Care – PPO | Admitting: Adult Health

## 2022-08-07 VITALS — BP 130/84 | HR 95 | Temp 98.1°F | Ht 60.0 in | Wt 145.0 lb

## 2022-08-07 DIAGNOSIS — J310 Chronic rhinitis: Secondary | ICD-10-CM | POA: Diagnosis not present

## 2022-08-07 DIAGNOSIS — R053 Chronic cough: Secondary | ICD-10-CM

## 2022-08-07 DIAGNOSIS — J4540 Moderate persistent asthma, uncomplicated: Secondary | ICD-10-CM

## 2022-08-07 DIAGNOSIS — J45909 Unspecified asthma, uncomplicated: Secondary | ICD-10-CM | POA: Diagnosis not present

## 2022-08-07 DIAGNOSIS — R9389 Abnormal findings on diagnostic imaging of other specified body structures: Secondary | ICD-10-CM | POA: Diagnosis not present

## 2022-08-07 NOTE — Assessment & Plan Note (Signed)
Under good control , no changes  Chest xray today   Plan  Patient Instructions  Chest xray today .  Delsym 2 tsp Twice daily  As needed  Cough . Claritin 10mg  daily As needed  drainage.  Flonase Nasal As needed   Symbicort 2 puffs Twice daily, rinse after use.  Saline nasal rinses As needed   Albuterol inhaler As needed   Follow-up with Dr. Chase Caller or Hartlee Amedee NP in 6 months and and as needed Please contact office for sooner follow up if symptoms do not improve or worsen or seek emergency care

## 2022-08-07 NOTE — Assessment & Plan Note (Signed)
Controlled, no changes   Plan  Patient Instructions  Chest xray today .  Delsym 2 tsp Twice daily  As needed  Cough . Claritin 10mg  daily As needed  drainage.  Flonase Nasal As needed   Symbicort 2 puffs Twice daily, rinse after use.  Saline nasal rinses As needed   Albuterol inhaler As needed   Follow-up with Dr. Chase Caller or Ezekiel Menzer NP in 6 months and and as needed Please contact office for sooner follow up if symptoms do not improve or worsen or seek emergency care

## 2022-08-07 NOTE — Assessment & Plan Note (Signed)
Well controlled, control for triggers  Plan  Patient Instructions  Chest xray today .  Delsym 2 tsp Twice daily  As needed  Cough . Claritin 10mg  daily As needed  drainage.  Flonase Nasal As needed   Symbicort 2 puffs Twice daily, rinse after use.  Saline nasal rinses As needed   Albuterol inhaler As needed   Follow-up with Dr. Chase Caller or Lexa Coronado NP in 6 months and and as needed Please contact office for sooner follow up if symptoms do not improve or worsen or seek emergency care

## 2022-08-07 NOTE — Patient Instructions (Addendum)
Chest xray today .  Delsym 2 tsp Twice daily  As needed  Cough . Claritin 10mg  daily As needed  drainage.  Flonase Nasal As needed   Symbicort 2 puffs Twice daily, rinse after use.  Saline nasal rinses As needed   Albuterol inhaler As needed   Follow-up with Dr. Chase Caller or Malyssa Maris NP in 6 months and and as needed Please contact office for sooner follow up if symptoms do not improve or worsen or seek emergency care

## 2022-08-07 NOTE — Assessment & Plan Note (Signed)
Ct chest 08/2021 with BB GGO - will check chest xray today , no flare of cough .   Plan  Patient Instructions  Chest xray today .  Delsym 2 tsp Twice daily  As needed  Cough . Claritin 10mg  daily As needed  drainage.  Flonase Nasal As needed   Symbicort 2 puffs Twice daily, rinse after use.  Saline nasal rinses As needed   Albuterol inhaler As needed   Follow-up with Dr. Chase Caller or Cheralyn Oliver NP in 6 months and and as needed Please contact office for sooner follow up if symptoms do not improve or worsen or seek emergency care

## 2022-08-07 NOTE — Progress Notes (Signed)
@Patient  ID: Danielle Bond, female    DOB: September 26, 1960, 62 y.o.   MRN: [id number]  Chief Complaint  Patient presents with   Follow-up    Referring provider: [name], MD  HPI: 62 year old female never smoker followed for moderate persistent asthma with allergic phenotype and chronic cough History of eosinophilia felt secondary to Lialda  History of Graves' disease, ulcerative colitis and pancreatitis  TEST/EVENTS :  PFTs February 02, 2020 shows mild restriction with FEV1 at 85%, ratio 89, FVC 74%, no significant bronchodilator response, DLCO at 109%.   01/2022 Eosinophils -600   CT chest August 23, 2021 negative for PE, bibasilar patchy groundglass  08/07/2022 Follow up : Asthma and chronic Cough  Patient returns for 56-month follow-up.  Patient has underlying asthma with an allergic phenotype and chronic cough.  Overall says she is doing well with no flare of cough or wheezing.  No increased albuterol use.  She does remain on Symbicort twice daily.  She remains very active.  Goes to the gym and does yoga, pilates and zumba.  Has chronic allergies.  Remains on Claritin and Flonase as needed.  Continues to follow with GI for ulcerative colitis.  Remains on Rinvoq. Says overall is doing okay . Did have C Diff last year, recovered but took a long time.   Traveled to [place] last year, had a really great trip . Going to [employer]  this year.   Flu shot , PVX and covid booster is up to date.   Allergies  Allergen Reactions   Almond Oil Hives and Shortness Of Breath   Lialda [Mesalamine] Other (See Comments)    Vomiting and rectal bleeding   Other Anaphylaxis    Almonds, tree nuts   Aspirin Hives and Nausea Only    Stomach cramps   Hydromorphone Hcl Other (See Comments)    Caused Enzyme levels to go up   Penicillins Hives and Swelling    Has patient had a PCN reaction causing immediate rash, facial/tongue/throat swelling, SOB or lightheadedness with hypotension: Yes Has  patient had a PCN reaction causing severe rash involving mucus membranes or skin necrosis: No Has patient had a PCN reaction that required hospitalization: No Has patient had a PCN reaction occurring within the last 10 years: No  Stomach cramps If all of the above answers are "NO", then may proceed with Cephalosporin use.    Acetaminophen Other (See Comments)    Has autoimmune hepatitis, wants to avoid APAP   Apriso [Mesalamine Er] Nausea And Vomiting   Corticosteroids Other (See Comments)    Had acute pancreatitis x 2 after steroid use.   Imitrex [Sumatriptan] Swelling    Injection caused swelling   Stadol [Butorphanol] Other (See Comments)    hallucinates   Topamax [Topiramate] Other (See Comments)    Kidney stones   Vancomycin Hives   Azathioprine Rash   Pepcid [Famotidine] Nausea And Vomiting    Makes acid worse   Prednisone Nausea And Vomiting    All steroids causes nausea and acute pancreatitis     Immunization History  Administered Date(s) Administered   COVID-19, mRNA, vaccine(Comirnaty)12 years and older 04/12/2022   Fluad Quad(high Dose 65+) 04/01/2021   Influenza Inj Mdck Quad Pf 03/23/2018, 04/01/2022   Influenza Inj Mdck Quad With Preservative 05/01/2019   Influenza Split 04/10/2012, 04/24/2013   Influenza, Seasonal, Injecte, Preservative Fre 04/15/2014   Influenza,inj,Quad PF,6+ Mos 03/23/2020   Influenza,inj,quad, With Preservative 05/02/2015   Influenza,trivalent, recombinat, inj, PF 04/10/2012, 04/24/2013  Influenza-Unspecified 04/22/2020   Moderna Covid-19 Vaccine Bivalent Booster 54yrs & up 04/24/2021   Moderna Sars-Covid-2 Vaccination 10/09/2019, 11/09/2019, 05/16/2020, 11/13/2020   PPD Test 10/05/2013   Pneumococcal Polysaccharide-23 04/22/2008, 04/23/2013, 05/24/2020   Tdap 07/23/2004, 07/22/2012   Varicella 07/22/2012    Past Medical History:  Diagnosis Date   Asthma    Chronic kidney disease    Hx of pancreatitis 06/16/2018   Kidney stone     Kidney stone    Migraine    Orthostatic hypotension    Osteopenia 03/31/2012   Recurrent upper respiratory infection (URI)    Ulcerative colitis (HCC) 03/31/2012   Urticaria     Tobacco History: Social History   Tobacco Use  Smoking Status Never  Smokeless Tobacco Never   Counseling given: Not Answered   Outpatient Medications Prior to Visit  Medication Sig Dispense Refill   albuterol (PROVENTIL) (2.5 MG/3ML) 0.083% nebulizer solution Take 3 mLs (2.5 mg total) by nebulization every 6 (six) hours as needed for wheezing or shortness of breath. 150 mL 1   albuterol (VENTOLIN HFA) 108 (90 Base) MCG/ACT inhaler TAKE 2 PUFFS BY MOUTH EVERY 6 HOURS AS NEEDED FOR WHEEZE OR SHORTNESS OF BREATH 25.5 each 1   budesonide-formoterol (SYMBICORT) 160-4.5 MCG/ACT inhaler Inhale 2 puffs into the lungs 2 (two) times daily.     cholecalciferol (VITAMIN D3) 25 MCG (1000 UNIT) tablet Take 1,000 Units by mouth daily.     clotrimazole-betamethasone (LOTRISONE) cream Apply 1 Application topically 2 (two) times daily. 45 g 1   [employer] VITAMIN B12 1000 MCG tablet Take 1,000 mcg by mouth daily.     EPINEPHRINE 0.3 mg/0.3 mL IJ SOAJ injection INJECT 0.3 MLS (0.3 MG TOTAL) INTO THE MUSCLE AS NEEDED FOR ANAPHYLAXIS. (Patient taking differently: Inject 0.3 mg into the muscle once as needed for anaphylaxis.) 2 each 1   fluticasone (FLONASE) 50 MCG/ACT nasal spray PLACE 2 SPRAYS INTO BOTH NOSTRILS DAILY AS NEEDED FOR ALLERGIES OR RHINITIS. 48 mL 1   folic acid (FOLVITE) 1 MG tablet TAKE 1 TABLET BY MOUTH EVERY DAY FOR 30 DAYS (Patient taking differently: Take 1 mg by mouth daily.) 90 tablet 1   levothyroxine (SYNTHROID) 100 MCG tablet Take 1 tablet (100 mcg total) by mouth daily. 90 tablet 3   lidocaine (XYLOCAINE) 5 % ointment Apply 1 Application topically daily as needed.     Magnesium 500 MG TABS Take 500 mg by mouth daily.     metoprolol tartrate (LOPRESSOR) 25 MG tablet Take 1 tablet (25 mg total) by mouth 2  (two) times daily. 180 tablet 2   Multiple Vitamin (MULTIVITAMIN WITH MINERALS) TABS tablet Take 1 tablet by mouth daily.     pantoprazole (PROTONIX) 40 MG tablet Take 30 mg by mouth daily.     RINVOQ 45 MG TB24 Take 1 tablet by mouth daily.     rosuvastatin (CRESTOR) 10 MG tablet TAKE 1 TABLET BY MOUTH EVERY DAY (Patient taking differently: Take 10 mg by mouth daily.) 90 tablet 3   triamcinolone cream (KENALOG) 0.1 % Apply 1 application topically daily as needed. (Patient taking differently: Apply 1 application  topically daily as needed (itching).) 453 g 6   valACYclovir (VALTREX) 500 MG tablet Take 1 tablet by mouth daily as needed (breakouts).     vitamin C (ASCORBIC ACID) 500 MG tablet Take 500 mg by mouth daily.     No facility-administered medications prior to visit.     Review of Systems:   Constitutional:   No  weight loss, night sweats,  Fevers, chills, fatigue, or  lassitude.  HEENT:   No headaches,  Difficulty swallowing,  Tooth/dental problems, or  Sore throat,                No sneezing, itching, ear ache, nasal congestion, post nasal drip,   CV:  No chest pain,  Orthopnea, PND, swelling in lower extremities, anasarca, dizziness, palpitations, syncope.   GI  No heartburn, indigestion, abdominal pain, nausea, vomiting, diarrhea, change in bowel habits, loss of appetite, bloody stools.   Resp: No shortness of breath with exertion or at rest.  No excess mucus, no productive cough,  No non-productive cough,  No coughing up of blood.  No change in color of mucus.  No wheezing.  No chest wall deformity  Skin: no rash or lesions.  GU: no dysuria, change in color of urine, no urgency or frequency.  No flank pain, no hematuria   MS:  No joint pain or swelling.  No decreased range of motion.  No back pain.    Physical Exam  BP 130/84 (BP Location: Left Arm, Patient Position: Sitting, Cuff Size: Normal)   Pulse 95   Temp 98.1 F (36.7 C) (Oral)   Ht 5' (1.524 m)   Wt 145  lb (65.8 kg)   LMP 03/03/2013   SpO2 99%   BMI 28.32 kg/m   GEN: A/Ox3; pleasant , NAD, well nourished    HEENT:  [place]/AT,  EACs-clear, TMs-wnl, NOSE-clear, THROAT-clear, no lesions, no postnasal drip or exudate noted.   NECK:  Supple w/ fair ROM; no JVD; normal carotid impulses w/o bruits; no thyromegaly or nodules palpated; no lymphadenopathy.    RESP  Clear  P & A; w/o, wheezes/ rales/ or rhonchi. no accessory muscle use, no dullness to percussion  CARD:  RRR, no m/r/g, no peripheral edema, pulses intact, no cyanosis or clubbing.  GI:   Soft & nt; nml bowel sounds; no organomegaly or masses detected.   Musco: Warm bil, no deformities or joint swelling noted.   Neuro: alert, no focal deficits noted.    Skin: Warm, no lesions or rashes    Lab Results:  CBC    Component Value Date/Time   WBC 5.4 06/28/2022 1201   RBC 3.96 06/28/2022 1201   HGB 12.3 06/28/2022 1201   HCT 36.5 06/28/2022 1201   PLT 279.0 06/28/2022 1201   MCV 92.2 06/28/2022 1201   MCH 28.9 01/25/2022 0331   MCHC 33.8 06/28/2022 1201   RDW 14.4 06/28/2022 1201   LYMPHSABS 2.5 01/24/2022 1405   MONOABS 0.9 01/24/2022 1405   EOSABS 0.6 (H) 01/24/2022 1405   BASOSABS 0.1 01/24/2022 1405    BMET    Component Value Date/Time   NA 138 06/28/2022 1201   K 4.4 06/28/2022 1201   CL 103 06/28/2022 1201   CO2 30 06/28/2022 1201   GLUCOSE 114 (H) 06/28/2022 1201   BUN 16 06/28/2022 1201   BUN 14 04/18/2021 0000   CREATININE 0.80 06/28/2022 1201   CREATININE 0.87 07/11/2020 1135   CALCIUM 9.0 06/28/2022 1201   GFRNONAA >60 01/25/2022 0331   GFRAA >60 03/03/2019 1644    BNP    Component Value Date/Time   BNP 9.5 08/22/2021 1331    ProBNP No results found for: "PROBNP"  Imaging: No results found.       Latest Ref Rng & Units 12/17/2019   10:45 AM  PFT Results  FVC-Pre L 2.15   FVC-Predicted Pre %  75   FVC-Post L 2.12   FVC-Predicted Post % 74   Pre FEV1/FVC % % 88   Post FEV1/FCV % %  89   FEV1-Pre L 1.89   FEV1-Predicted Pre % 85   FEV1-Post L 1.90   DLCO uncorrected ml/min/mmHg 19.37   DLCO UNC% % 109   DLCO corrected ml/min/mmHg 19.37   DLCO COR %Predicted % 109   DLVA Predicted % 136   TLC L 3.39   TLC % Predicted % 76   RV % Predicted % 66     Lab Results  Component Value Date   NITRICOXIDE 13 03/21/2022        Assessment & Plan:   Asthma Under good control , no changes  Chest xray today   Plan  Patient Instructions  Chest xray today .  Delsym 2 tsp Twice daily  As needed  Cough . Claritin 10mg  daily As needed  drainage.  Flonase Nasal As needed   Symbicort 2 puffs Twice daily, rinse after use.  Saline nasal rinses As needed   Albuterol inhaler As needed   Follow-up with Dr. Chase Caller or Tyronda Vizcarrondo NP in 6 months and and as needed Please contact office for sooner follow up if symptoms do not improve or worsen or seek emergency care     Chronic rhinitis Controlled, no changes   Plan  Patient Instructions  Chest xray today .  Delsym 2 tsp Twice daily  As needed  Cough . Claritin 10mg  daily As needed  drainage.  Flonase Nasal As needed   Symbicort 2 puffs Twice daily, rinse after use.  Saline nasal rinses As needed   Albuterol inhaler As needed   Follow-up with Dr. Chase Caller or Melah Ebling NP in 6 months and and as needed Please contact office for sooner follow up if symptoms do not improve or worsen or seek emergency care     Chronic cough Well controlled, control for triggers  Plan  Patient Instructions  Chest xray today .  Delsym 2 tsp Twice daily  As needed  Cough . Claritin 10mg  daily As needed  drainage.  Flonase Nasal As needed   Symbicort 2 puffs Twice daily, rinse after use.  Saline nasal rinses As needed   Albuterol inhaler As needed   Follow-up with Dr. Chase Caller or Keyaan Lederman NP in 6 months and and as needed Please contact office for sooner follow up if symptoms do not improve or worsen or seek emergency care      Abnormal CT of the chest Ct chest 08/2021 with BB GGO - will check chest xray today , no flare of cough .   Plan  Patient Instructions  Chest xray today .  Delsym 2 tsp Twice daily  As needed  Cough . Claritin 10mg  daily As needed  drainage.  Flonase Nasal As needed   Symbicort 2 puffs Twice daily, rinse after use.  Saline nasal rinses As needed   Albuterol inhaler As needed   Follow-up with Dr. Chase Caller or Toia Micale NP in 6 months and and as needed Please contact office for sooner follow up if symptoms do not improve or worsen or seek emergency care       Rexene Edison, NP 08/07/2022

## 2022-08-08 ENCOUNTER — Other Ambulatory Visit: Payer: BC Managed Care – PPO

## 2022-08-09 ENCOUNTER — Other Ambulatory Visit: Payer: Self-pay | Admitting: Cardiology

## 2022-08-17 ENCOUNTER — Ambulatory Visit: Payer: BC Managed Care – PPO | Admitting: Cardiology

## 2022-08-17 ENCOUNTER — Encounter: Payer: Self-pay | Admitting: Cardiology

## 2022-08-17 VITALS — BP 149/94 | HR 103 | Resp 16 | Ht 60.0 in | Wt 150.0 lb

## 2022-08-17 DIAGNOSIS — E782 Mixed hyperlipidemia: Secondary | ICD-10-CM

## 2022-08-17 DIAGNOSIS — R002 Palpitations: Secondary | ICD-10-CM | POA: Diagnosis not present

## 2022-08-17 NOTE — Progress Notes (Signed)
Patient referred by [name], MD for abnormal echocardiogram.  Subjective:   Danielle Bond, female    DOB: [birth year], 62 y.o.   MRN: [id number]   Chief Complaint  Patient presents with   Tachycardia   Results    monitor   62 y/o Caucasian female with hyperlipidemia, aortic and coronary atherrosclerosis, presyncope, Graves' disease, status post thyroidectomy, ulcerative colitis, h/o acute pancreatitis, migraine  Patient is under a lot of stress related to her father's health. She continues to have episodes of short lasting palpitations. Blood pressure elevated today, generally well controlled at home.    Current Outpatient Medications:    albuterol (PROVENTIL) (2.5 MG/3ML) 0.083% nebulizer solution, Take 3 mLs (2.5 mg total) by nebulization every 6 (six) hours as needed for wheezing or shortness of breath., Disp: 150 mL, Rfl: 1   albuterol (VENTOLIN HFA) 108 (90 Base) MCG/ACT inhaler, TAKE 2 PUFFS BY MOUTH EVERY 6 HOURS AS NEEDED FOR WHEEZE OR SHORTNESS OF BREATH, Disp: 25.5 each, Rfl: 1   budesonide-formoterol (SYMBICORT) 160-4.5 MCG/ACT inhaler, Inhale 2 puffs into the lungs 2 (two) times daily., Disp: , Rfl:    cholecalciferol (VITAMIN D3) 25 MCG (1000 UNIT) tablet, Take 1,000 Units by mouth daily., Disp: , Rfl:    clotrimazole-betamethasone (LOTRISONE) cream, Apply 1 Application topically 2 (two) times daily., Disp: 45 g, Rfl: 1   [employer] VITAMIN B12 1000 MCG tablet, Take 1,000 mcg by mouth daily., Disp: , Rfl:    EPINEPHRINE 0.3 mg/0.3 mL IJ SOAJ injection, INJECT 0.3 MLS (0.3 MG TOTAL) INTO THE MUSCLE AS NEEDED FOR ANAPHYLAXIS. (Patient taking differently: Inject 0.3 mg into the muscle once as needed for anaphylaxis.), Disp: 2 each, Rfl: 1   fluticasone (FLONASE) 50 MCG/ACT nasal spray, PLACE 2 SPRAYS INTO BOTH NOSTRILS DAILY AS NEEDED FOR ALLERGIES OR RHINITIS., Disp: 48 mL, Rfl: 1   folic acid (FOLVITE) 1 MG tablet, TAKE 1 TABLET BY MOUTH EVERY DAY FOR 30 DAYS (Patient  taking differently: Take 1 mg by mouth daily.), Disp: 90 tablet, Rfl: 1   levothyroxine (SYNTHROID) 100 MCG tablet, Take 1 tablet (100 mcg total) by mouth daily., Disp: 90 tablet, Rfl: 3   lidocaine (XYLOCAINE) 5 % ointment, Apply 1 Application topically daily as needed., Disp: , Rfl:    Magnesium 500 MG TABS, Take 500 mg by mouth daily., Disp: , Rfl:    metoprolol tartrate (LOPRESSOR) 25 MG tablet, Take 1 tablet (25 mg total) by mouth 2 (two) times daily., Disp: 180 tablet, Rfl: 2   Multiple Vitamin (MULTIVITAMIN WITH MINERALS) TABS tablet, Take 1 tablet by mouth daily., Disp: , Rfl:    NON FORMULARY, [place], Disp: , Rfl:    Omega 3-6-9 Fatty Acids (OMEGA 3-6-9 PO), Take 1 tablet by mouth daily., Disp: , Rfl:    pantoprazole (PROTONIX) 40 MG tablet, Take 30 mg by mouth daily., Disp: , Rfl:    RINVOQ 45 MG TB24, Take 1 tablet by mouth daily., Disp: , Rfl:    rosuvastatin (CRESTOR) 10 MG tablet, TAKE 1 TABLET BY MOUTH EVERY DAY, Disp: 90 tablet, Rfl: 3   valACYclovir (VALTREX) 500 MG tablet, Take 1 tablet by mouth daily as needed (breakouts)., Disp: , Rfl:    vitamin C (ASCORBIC ACID) 500 MG tablet, Take 500 mg by mouth daily., Disp: , Rfl:     Cardiovascular studies:  Mobile cardiac telemetry 13 days 06/29/2022 - 07/13/2022: Dominant rhythm: Sinus. HR 58-184 bpm. Avg HR 85 bpm. 0 episodes of SVT. <  1% isolated SVE, couplets. 0 episodes of VT. <1% isolated VE.  No atrial fibrillation/atrial flutter/SVT/VT/high grade AV block, sinus pause >3sec noted. 3 patient triggered events, correlated with sinus rhythm.  EKG 03/29/2022: Sinus rhythm Nonspecific T wave abnormality  Echocardiogram 11/09/2021:  Normal LV systolic function with visual EF 60-65%. Left ventricle cavity  is normal in size. Normal left ventricular wall thickness. Normal global  wall motion. Normal diastolic filling pattern, normal LAP.  Mild (Grade I) mitral regurgitation.  Mild tricuspid regurgitation. No evidence of  pulmonary hypertension.  Compared to 04/28/2019 mild MR / TR are new otherwise no significant  change.   Exercise Sestamibi stress test 01/09/2021: Exercise nuclear stress test was performed using Bruce protocol. Patient reached 7 METS, and 106% of age predicted maximum heart rate. Exercise capacity was low. No chest pain reported. Heart rate and hemodynamic response were normal. Stress EKG revealed no ischemic changes. Normal myocardial perfusion. Stress LVEF 54%. Low risk study.  CTA chest 05/23/2018: No demonstrable pulmonary embolus. No thoracic aortic aneurysm or dissection. There is aortic atherosclerosis.  Right subclavian artery arises distally to the other great vessels, passing posteriorly to the esophagus and impressing upon the esophagus as it passes to the right.  No lung edema or consolidation.  No evident thoracic adenopathy. Aortic Atherosclerosis (ICD10-I70.0).  Recent labs: 01/30/2022: Glucose 255, BUN/Cr 14/0.84. EGFR 75. Na/K 136/4.3. Rest of the CMP normal H/H 11/34. MCV 90. Platelets 373 HbA1C 6.3% TSH 6.7 high  06/2021: Chol 209, TG 78, HDL 93, LDL 100  Review of Systems  Cardiovascular:  Positive for palpitations. Negative for chest pain, dyspnea on exertion, leg swelling and syncope.  Neurological:  Positive for dizziness.       Vitals:   08/17/22 0947  BP: (!) 149/94  Pulse: (!) 103  Resp: 16  SpO2: 96%     Body mass index is 29.29 kg/m. Filed Weights   08/17/22 0947  Weight: 150 lb (68 kg)     Objective:   Physical Exam Vitals and nursing note reviewed.  Constitutional:      General: She is not in acute distress. Neck:     Vascular: No JVD.  Cardiovascular:     Rate and Rhythm: Normal rate and regular rhythm.     Pulses: Intact distal pulses.     Heart sounds: Normal heart sounds. No murmur heard. Pulmonary:     Effort: Pulmonary effort is normal.     Breath sounds: Normal breath sounds. No wheezing or rales.            Assessment & Recommendations:   62 y/o Caucasian female with hyperlipidemia, aortic and coronary atherrosclerosis, presyncope, Graves' disease, status post thyroidectomy, ulcerative colitis, h/o acute pancreatitis, migraine,  Palpitations: Likely symptomatic PAC, PVC. Underlying stress likely a factor. Continue metoprolol tartrate 25 mg bid.  She does not want to increase the dose at this time.   Hyperlipidemia: HDL 93, LDL 100 on Crestor Okay to continue the same. .   Aorta and coronary atherosclerosis: Minimal. Continue risk factor modification, especially lipid control. Not on Aspirin due to h/o GI bleeding with ulcerative colitis.   Chest pain: No recurrence.  Elevated blood pressure without diagnosis of hypertension: Likely component of white coat hypertension. No change today.  F/u in 6 months    Nigel Mormon, MD Pager: [phone] Office: [phone]

## 2022-08-21 ENCOUNTER — Ambulatory Visit (INDEPENDENT_AMBULATORY_CARE_PROVIDER_SITE_OTHER): Payer: BC Managed Care – PPO | Admitting: Family Medicine

## 2022-08-21 ENCOUNTER — Encounter: Payer: Self-pay | Admitting: Family Medicine

## 2022-08-21 VITALS — BP 111/76 | HR 94 | Ht 60.0 in | Wt 149.0 lb

## 2022-08-21 DIAGNOSIS — K51011 Ulcerative (chronic) pancolitis with rectal bleeding: Secondary | ICD-10-CM

## 2022-08-21 DIAGNOSIS — J029 Acute pharyngitis, unspecified: Secondary | ICD-10-CM

## 2022-08-21 DIAGNOSIS — H6993 Unspecified Eustachian tube disorder, bilateral: Secondary | ICD-10-CM | POA: Diagnosis not present

## 2022-08-21 DIAGNOSIS — J4540 Moderate persistent asthma, uncomplicated: Secondary | ICD-10-CM

## 2022-08-21 LAB — POCT RAPID STREP A (OFFICE): Rapid Strep A Screen: NEGATIVE

## 2022-08-21 MED ORDER — AZITHROMYCIN 250 MG PO TABS: ORAL_TABLET | ORAL | 0 refills | Status: DC

## 2022-08-21 NOTE — Progress Notes (Signed)
   Danielle Bond is a 62 y.o. female who presents today for an office visit.  Assessment/Plan:  New/Acute Problems: Otitis Media  No red flags.  Consider some of previous episodes of otitis media.  Start azithromycin.  She has done well with this in the past.  She will follow-up with ENT as previously planned.  We discussed reasons to return.  Chronic Problems Addressed Today: Eustachian tube dysfunction Contributing to recurrent otitis media.  She has had tympanostomy tubes in the past but did not tolerate.  Will be treating as above and she will follow-up with ENT as previously planned.  Asthma Stable.  No recent flares.  Recently saw pulmonology.  Continue current regimen  Chronic ulcerative pancolitis ([place]) Has been following with GI.  Her bowel patterns are back to baseline following her c diff infection last month.      Subjective:  HPI:  See A/p for status of chronic conditions. She is here today with sore throat.  This started about 2 weeks ago. Has been getting worse. Has been using salt water rinse. Using humidifier. She did have a low grade fever over the last few days. No sick contacts. Pain is only located on the right side of her throat. Some ear pain.  Some difficulty with swallowing.   Since our last visit she has seen the pulmonologist and cardiologist. They did not make any changes to her treatment plan at that time.   She has seen gastroenterology also since our last visit. They repeat her c diff studies which were negative. Her bowel patterns are back to baseline.        Objective:  Physical Exam: BP 111/76   Pulse 94   Ht 5' (1.524 m)   Wt 149 lb (67.6 kg)   LMP 03/03/2013   SpO2 97%   BMI 29.10 kg/m   Gen: No acute distress, resting comfortably HEENT: Left TM clear.  Right TM erythematous.  No lymphadenopathy.  OP erythematous.  No exudate. CV: Regular rate and rhythm with no murmurs appreciated Pulm: Normal work of breathing, clear to auscultation  bilaterally with no crackles, wheezes, or rhonchi Neuro: Grossly normal, moves all extremities Psych: Normal affect and thought content      Nikeya Maxim M. [name], MD 08/21/2022 11:54 AM

## 2022-08-21 NOTE — Assessment & Plan Note (Signed)
Stable.  No recent flares.  Recently saw pulmonology.  Continue current regimen

## 2022-08-21 NOTE — Patient Instructions (Addendum)
It was very nice to see you today!  You have an ear infection.   Please start azithromycin.  Please come back soon for your annual physical.  Let us know if your pain is not improving.  Take care, Dr [name]  PLEASE NOTE:  If you had any lab tests, please let us know if you have not heard back within a few days. You may see your results on mychart before we have a chance to review them but we will give you a call once they are reviewed by [name].   If we ordered any referrals today, please let us know if you have not heard from their office within the next week.   If you had any urgent prescriptions sent in today, please check with the pharmacy within an hour of our visit to make sure the prescription was transmitted appropriately.   Please try these tips to maintain a healthy lifestyle:  Eat at least 3 REAL meals and 1-2 snacks per day.  Aim for no more than 5 hours between eating.  If you eat breakfast, please do so within one hour of getting up.   Each meal should contain half fruits/vegetables, one quarter protein, and one quarter carbs (no bigger than a computer mouse)  Cut down on sweet beverages. This includes juice, soda, and sweet tea.   Drink at least 1 glass of water with each meal and aim for at least 8 glasses per day  Exercise at least 150 minutes every week.

## 2022-08-21 NOTE — Assessment & Plan Note (Signed)
Has been following with GI.  Her bowel patterns are back to baseline following her c diff infection last month.

## 2022-08-21 NOTE — Assessment & Plan Note (Signed)
Contributing to recurrent otitis media.  She has had tympanostomy tubes in the past but did not tolerate.  Will be treating as above and she will follow-up with ENT as previously planned.

## 2022-08-28 ENCOUNTER — Encounter: Payer: BC Managed Care – PPO | Admitting: Family Medicine

## 2022-08-31 DIAGNOSIS — K5100 Ulcerative (chronic) pancolitis without complications: Secondary | ICD-10-CM | POA: Diagnosis not present

## 2022-08-31 DIAGNOSIS — K1370 Unspecified lesions of oral mucosa: Secondary | ICD-10-CM | POA: Diagnosis not present

## 2022-09-18 ENCOUNTER — Ambulatory Visit (INDEPENDENT_AMBULATORY_CARE_PROVIDER_SITE_OTHER): Payer: BC Managed Care – PPO | Admitting: Family Medicine

## 2022-09-18 ENCOUNTER — Encounter: Payer: Self-pay | Admitting: Family Medicine

## 2022-09-18 VITALS — BP 121/83 | HR 80 | Temp 97.8°F | Ht 60.0 in | Wt 153.0 lb

## 2022-09-18 DIAGNOSIS — R739 Hyperglycemia, unspecified: Secondary | ICD-10-CM | POA: Diagnosis not present

## 2022-09-18 DIAGNOSIS — Z23 Encounter for immunization: Secondary | ICD-10-CM

## 2022-09-18 DIAGNOSIS — K51011 Ulcerative (chronic) pancolitis with rectal bleeding: Secondary | ICD-10-CM

## 2022-09-18 DIAGNOSIS — E785 Hyperlipidemia, unspecified: Secondary | ICD-10-CM

## 2022-09-18 DIAGNOSIS — K754 Autoimmune hepatitis: Secondary | ICD-10-CM | POA: Diagnosis not present

## 2022-09-18 DIAGNOSIS — E039 Hypothyroidism, unspecified: Secondary | ICD-10-CM | POA: Diagnosis not present

## 2022-09-18 DIAGNOSIS — Z0001 Encounter for general adult medical examination with abnormal findings: Secondary | ICD-10-CM | POA: Diagnosis not present

## 2022-09-18 LAB — COMPREHENSIVE METABOLIC PANEL
ALT: 23 U/L (ref 0–35)
AST: 33 U/L (ref 0–37)
Albumin: 4.4 g/dL (ref 3.5–5.2)
Alkaline Phosphatase: 48 U/L (ref 39–117)
BUN: 13 mg/dL (ref 6–23)
CO2: 27 mEq/L (ref 19–32)
Calcium: 9.5 mg/dL (ref 8.4–10.5)
Chloride: 103 mEq/L (ref 96–112)
Creatinine, Ser: 0.8 mg/dL (ref 0.40–1.20)
GFR: 79.27 mL/min (ref >60.00)
Glucose, Bld: 103 mg/dL — ABNORMAL HIGH (ref 70–99)
Potassium: 4.6 mEq/L (ref 3.5–5.1)
Sodium: 139 mEq/L (ref 135–145)
Total Bilirubin: 0.6 mg/dL (ref 0.2–1.2)
Total Protein: 7.6 g/dL (ref 6.0–8.3)

## 2022-09-18 LAB — MAGNESIUM: Magnesium: 2 mg/dL (ref 1.5–2.5)

## 2022-09-18 LAB — LIPID PANEL
Cholesterol: 258 mg/dL — ABNORMAL HIGH (ref 0–200)
HDL: 96.5 mg/dL (ref >39.00)
LDL Cholesterol: 142 mg/dL — ABNORMAL HIGH (ref 0–99)
NonHDL: 161.15
Total CHOL/HDL Ratio: 3
Triglycerides: 96 mg/dL (ref 0.0–149.0)
VLDL: 19.2 mg/dL (ref 0.0–40.0)

## 2022-09-18 LAB — CBC
HCT: 36.8 % (ref 36.0–46.0)
Hemoglobin: 12.3 g/dL (ref 12.0–15.0)
MCHC: 33.5 g/dL (ref 30.0–36.0)
MCV: 91.4 fl (ref 78.0–100.0)
Platelets: 316 10*3/uL (ref 150.0–400.0)
RBC: 4.03 Mil/uL (ref 3.87–5.11)
RDW: 13.5 % (ref 11.5–15.5)
WBC: 5.3 10*3/uL (ref 4.0–10.5)

## 2022-09-18 LAB — VITAMIN D 25 HYDROXY (VIT D DEFICIENCY, FRACTURES): VITD: 26.21 ng/mL — ABNORMAL LOW (ref 30.00–100.00)

## 2022-09-18 LAB — TSH: TSH: 0.62 u[IU]/mL (ref 0.35–5.50)

## 2022-09-18 LAB — HEMOGLOBIN A1C: Hgb A1c MFr Bld: 6.4 % (ref 4.6–6.5)

## 2022-09-18 NOTE — Addendum Note (Signed)
Addended by: Betti Cruz on: 09/18/2022 12:08 PM   Modules accepted: Orders

## 2022-09-18 NOTE — Patient Instructions (Addendum)
It was very nice to see you today!  We will check blood work today.   We will give you your shingles vaccine today.  Please keep working on diet and exercise.  I will refer you to see [facility].   Please come back in a year for your next physical.  Come back sooner if needed.   Take care, Dr [name]  PLEASE NOTE:  If you had any lab tests, please let us know if you have not heard back within a few days. You may see your results on mychart before we have a chance to review them but we will give you a call once they are reviewed by [name].   If we ordered any referrals today, please let us know if you have not heard from their office within the next week.   If you had any urgent prescriptions sent in today, please check with the pharmacy within an hour of our visit to make sure the prescription was transmitted appropriately.   Please try these tips to maintain a healthy lifestyle:  Eat at least 3 REAL meals and 1-2 snacks per day.  Aim for no more than 5 hours between eating.  If you eat breakfast, please do so within one hour of getting up.   Each meal should contain half fruits/vegetables, one quarter protein, and one quarter carbs (no bigger than a computer mouse)  Cut down on sweet beverages. This includes juice, soda, and sweet tea.   Drink at least 1 glass of water with each meal and aim for at least 8 glasses per day  Exercise at least 150 minutes every week.    Preventive Care 29-98 Years Old, Female Preventive care refers to lifestyle choices and visits with your health care provider that can promote health and wellness. Preventive care visits are also called wellness exams. What can I expect for my preventive care visit? Counseling Your health care provider may ask you questions about your: Medical history, including: Past medical problems. Family medical history. Pregnancy history. Current health, including: Menstrual cycle. Method of birth  control. Emotional well-being. Home life and relationship well-being. Sexual activity and sexual health. Lifestyle, including: Alcohol, nicotine or tobacco, and drug use. Access to firearms. Diet, exercise, and sleep habits. Work and work Statistician. Sunscreen use. Safety issues such as seatbelt and bike helmet use. Physical exam Your health care provider will check your: Height and weight. These may be used to calculate your BMI (body mass index). BMI is a measurement that tells if you are at a healthy weight. Waist circumference. This measures the distance around your waistline. This measurement also tells if you are at a healthy weight and may help predict your risk of certain diseases, such as type 2 diabetes and high blood pressure. Heart rate and blood pressure. Body temperature. Skin for abnormal spots. What immunizations do I need?  Vaccines are usually given at various ages, according to a schedule. Your health care provider will recommend vaccines for you based on your age, medical history, and lifestyle or other factors, such as travel or where you work. What tests do I need? Screening Your health care provider may recommend screening tests for certain conditions. This may include: Lipid and cholesterol levels. Diabetes screening. This is done by checking your blood sugar (glucose) after you have not eaten for a while (fasting). Pelvic exam and Pap test. Hepatitis B test. Hepatitis C test. HIV (human immunodeficiency virus) test. STI (sexually transmitted infection) testing, if you are  at risk. Lung cancer screening. Colorectal cancer screening. Mammogram. Talk with your health care provider about when you should start having regular mammograms. This may depend on whether you have a family history of breast cancer. BRCA-related cancer screening. This may be done if you have a family history of breast, ovarian, tubal, or peritoneal cancers. Bone density scan. This is  done to screen for osteoporosis. Talk with your health care provider about your test results, treatment options, and if necessary, the need for more tests. Follow these instructions at home: Eating and drinking  Eat a diet that includes fresh fruits and vegetables, whole grains, lean protein, and low-fat dairy products. Take vitamin and mineral supplements as recommended by your health care provider. Do not drink alcohol if: Your health care provider tells you not to drink. You are pregnant, may be pregnant, or are planning to become pregnant. If you drink alcohol: Limit how much you have to 0-1 drink a day. Know how much alcohol is in your drink. In the [place] one drink equals one 12 oz bottle of beer (355 mL), one 5 oz glass of wine (148 mL), or one 1 oz glass of hard liquor (44 mL). Lifestyle Brush your teeth every morning and night with fluoride toothpaste. Floss one time each day. Exercise for at least 30 minutes 5 or more days each week. Do not use any products that contain nicotine or tobacco. These products include cigarettes, chewing tobacco, and vaping devices, such as e-cigarettes. If you need help quitting, ask your health care provider. Do not use drugs. If you are sexually active, practice safe sex. Use a condom or other form of protection to prevent STIs. If you do not wish to become pregnant, use a form of birth control. If you plan to become pregnant, see your health care provider for a prepregnancy visit. Take aspirin only as told by your health care provider. Make sure that you understand how much to take and what form to take. Work with your health care provider to find out whether it is safe and beneficial for you to take aspirin daily. Find healthy ways to manage stress, such as: Meditation, yoga, or listening to music. Journaling. Talking to a trusted person. Spending time with friends and family. Minimize exposure to UV radiation to reduce your risk of skin  cancer. Safety Always wear your seat belt while driving or riding in a vehicle. Do not drive: If you have been drinking alcohol. Do not ride with someone who has been drinking. When you are tired or distracted. While texting. If you have been using any mind-altering substances or drugs. Wear a helmet and other protective equipment during sports activities. If you have firearms in your house, make sure you follow all gun safety procedures. Seek help if you have been physically or sexually abused. What's next? Visit your health care provider once a year for an annual wellness visit. Ask your health care provider how often you should have your eyes and teeth checked. Stay up to date on all vaccines. This information is not intended to replace advice given to you by your health care provider. Make sure you discuss any questions you have with your health care provider. Document Revised: 01/04/2021 Document Reviewed: 01/04/2021 Elsevier Patient Education  [employer].

## 2022-09-18 NOTE — Assessment & Plan Note (Signed)
Overall doing well.  She is currently on Rinvoq.  Last flare was a few months ago.  She would like to be referred to [facility].  Will place referral today.  Check labs.

## 2022-09-18 NOTE — Assessment & Plan Note (Signed)
Has been following with GI for this.  Will check labs today.  She request to be referred to [facility].  Will place referral today.

## 2022-09-18 NOTE — Assessment & Plan Note (Signed)
Check labs.  She is on Crestor 10 mg daily.  Tolerating well.

## 2022-09-18 NOTE — Progress Notes (Signed)
Chief Complaint:  Danielle Bond is a 62 y.o. female who presents today for her annual comprehensive physical exam.    Assessment/Plan:  Chronic Problems Addressed Today: Hypothyroidism s/p total thyroidectomy 1997 On Synthroid 100 mcg daily.  Check TSH.  Autoimmune hepatitis Has been following with GI for this.  Will check labs today.  She request to be referred to [facility].  Will place referral today.  Chronic ulcerative pancolitis ([place]) Overall doing well.  She is currently on Rinvoq.  Last flare was a few months ago.  She would like to be referred to [facility].  Will place referral today.  Check labs.  Dyslipidemia Check labs.  She is on Crestor 10 mg daily.  Tolerating well.  Preventative Healthcare: Shingles vaccine given today. Check labs. Follows with OBGYN for womens health.   Patient Counseling(The following topics were reviewed and/or handout was given):  -Nutrition: Stressed importance of moderation in sodium/caffeine intake, saturated fat and cholesterol, caloric balance, sufficient intake of fresh fruits, vegetables, and fiber.  -Stressed the importance of regular exercise.   -Substance Abuse: Discussed cessation/primary prevention of tobacco, alcohol, or other drug use; driving or other dangerous activities under the influence; availability of treatment for abuse.   -Injury prevention: Discussed safety belts, safety helmets, smoke detector, smoking near bedding or upholstery.   -Sexuality: Discussed sexually transmitted diseases, partner selection, use of condoms, avoidance of unintended pregnancy and contraceptive alternatives.   -Dental health: Discussed importance of regular tooth brushing, flossing, and dental visits.  -Health maintenance and immunizations reviewed. Please refer to Health maintenance section.  Return to care in 1 year for next preventative visit.     Subjective:  HPI:  She has no acute complaints today. She has no acute concerns today.   See A/p for status   Lifestyle Diet: Balanced. Avoiding sweets and carbs.  Exercise: Working on yoga.      09/18/2022    9:58 AM  Depression screen PHQ 2/9  Decreased Interest 0  Down, Depressed, Hopeless 0  PHQ - 2 Score 0    Health Maintenance Due  Topic Date Due   DTaP/Tdap/Td (3 - Td or Tdap) 07/22/2022     ROS: Per HPI, otherwise a complete review of systems was negative.   PMH:  The following were reviewed and entered/updated in epic: Past Medical History:  Diagnosis Date   Asthma    Chronic kidney disease    Hx of pancreatitis 06/16/2018   Kidney stone    Kidney stone    Migraine    Orthostatic hypotension    Osteopenia 03/31/2012   Recurrent upper respiratory infection (URI)    Ulcerative colitis ([place]) 03/31/2012   Urticaria    Patient Active Problem List   Diagnosis Date Noted   Chronic cough 08/07/2022   Abnormal CT of the chest 08/07/2022   Palpitations 03/29/2022   Precordial Bond 03/29/2022   Chronic rhinitis 03/21/2022   Onychomycosis 01/30/2022   Arthralgia 07/19/2021   Immunosuppressed status ([place]) 07/19/2021   Eustachian tube dysfunction 06/13/2021   Osteoporosis 07/11/2020   Atherosclerosis of native coronary artery of native heart without angina pectoris 06/08/2019   Aortic atherosclerosis ([place]) 06/08/2019   Dyslipidemia 05/02/2019   Postural dizziness with presyncope 05/02/2019   HSV-2 (herpes simplex virus 2) infection 11/04/2018   Hypothyroidism s/p total thyroidectomy 1997 08/15/2018   Autoimmune hepatitis ([place]) 08/16/2012   Asthma 03/31/2012   Chronic ulcerative pancolitis ([place]) 03/31/2012   History of renal calculi 03/31/2012   Migraines  03/31/2012   Mitral valve prolapse    Past Surgical History:  Procedure Laterality Date   BIOPSY  01/25/2022   Procedure: BIOPSY;  Surgeon: Ronnette Juniper, MD;  Location: [facility];  Service: Gastroenterology;;   CESAREAN SECTION     x3   CHOLECYSTECTOMY     COLONOSCOPY     COLONOSCOPY N/A  01/25/2022   Procedure: COLONOSCOPY;  Surgeon: Ronnette Juniper, MD;  Location: [facility];  Service: Gastroenterology;  Laterality: N/A;   LITHOTRIPSY     THYROIDECTOMY     1997    Family History  Problem Relation Age of Onset   Cervical cancer Paternal 52    Heart disease Paternal Grandmother    Heart disease Mother    Dementia Mother    Thyroid disease Mother    Osteoarthritis Mother    Asthma Mother    Heart attack Father    Breast cancer Cousin    Heart attack Brother    Heart disease Brother     Medications- reviewed and updated Current Outpatient Medications  Medication Sig Dispense Refill   albuterol (PROVENTIL) (2.5 MG/3ML) 0.083% nebulizer solution Take 3 mLs (2.5 mg total) by nebulization every 6 (six) hours as needed for wheezing or shortness of breath. 150 mL 1   albuterol (VENTOLIN HFA) 108 (90 Base) MCG/ACT inhaler TAKE 2 PUFFS BY MOUTH EVERY 6 HOURS AS NEEDED FOR WHEEZE OR SHORTNESS OF BREATH 25.5 each 1   budesonide-formoterol (SYMBICORT) 160-4.5 MCG/ACT inhaler Inhale 2 puffs into the lungs 2 (two) times daily.     cholecalciferol (VITAMIN D3) 25 MCG (1000 UNIT) tablet Take 1,000 Units by mouth daily.     clotrimazole-betamethasone (LOTRISONE) cream Apply 1 Application topically 2 (two) times daily. 45 g 1   [facility] VITAMIN B12 1000 MCG tablet Take 1,000 mcg by mouth daily.     EPINEPHRINE 0.3 mg/0.3 mL IJ SOAJ injection INJECT 0.3 MLS (0.3 MG TOTAL) INTO THE MUSCLE AS NEEDED FOR ANAPHYLAXIS. (Patient taking differently: Inject 0.3 mg into the muscle once as needed for anaphylaxis.) 2 each 1   fluticasone (FLONASE) 50 MCG/ACT nasal spray PLACE 2 SPRAYS INTO BOTH NOSTRILS DAILY AS NEEDED FOR ALLERGIES OR RHINITIS. 48 mL 1   folic acid (FOLVITE) 1 MG tablet TAKE 1 TABLET BY MOUTH EVERY DAY FOR 30 DAYS (Patient taking differently: Take 1 mg by mouth daily.) 90 tablet 1   levothyroxine (SYNTHROID) 100 MCG tablet Take 1 tablet (100 mcg total) by mouth daily. 90 tablet 3    lidocaine (XYLOCAINE) 5 % ointment Apply 1 Application topically daily as needed.     Magnesium 500 MG TABS Take 500 mg by mouth daily.     metoprolol tartrate (LOPRESSOR) 25 MG tablet Take 1 tablet (25 mg total) by mouth 2 (two) times daily. 180 tablet 2   Multiple Vitamin (MULTIVITAMIN WITH MINERALS) TABS tablet Take 1 tablet by mouth daily.     NON FORMULARY [place] Beet     Omega 3-6-9 Fatty Acids (OMEGA 3-6-9 PO) Take 1 tablet by mouth daily.     pantoprazole (PROTONIX) 40 MG tablet Take 30 mg by mouth daily.     RINVOQ 45 MG TB24 Take 1 tablet by mouth daily.     rosuvastatin (CRESTOR) 10 MG tablet TAKE 1 TABLET BY MOUTH EVERY DAY 90 tablet 3   valACYclovir (VALTREX) 500 MG tablet Take 1 tablet by mouth daily as needed (breakouts).     vitamin C (ASCORBIC ACID) 500 MG tablet Take 500 mg by  mouth daily.     No current facility-administered medications for this visit.    Allergies-reviewed and updated Allergies  Allergen Reactions   Almond Oil Hives and Shortness Of Breath   Lialda [Mesalamine] Other (See Comments)    Vomiting and rectal bleeding   Other Anaphylaxis    Almonds, tree nuts   Aspirin Hives and Nausea Only    Stomach cramps   Hydromorphone Hcl Other (See Comments)    Caused Enzyme levels to go up   Penicillins Hives and Swelling    Has patient had a PCN reaction causing immediate rash, facial/tongue/throat swelling, SOB or lightheadedness with hypotension: Yes Has patient had a PCN reaction causing severe rash involving mucus membranes or skin necrosis: No Has patient had a PCN reaction that required hospitalization: No Has patient had a PCN reaction occurring within the last 10 years: No  Stomach cramps If all of the above answers are "NO", then may proceed with Cephalosporin use.    Acetaminophen Other (See Comments)    Has autoimmune hepatitis, wants to avoid APAP   Apriso [Mesalamine Er] Nausea And Vomiting   Corticosteroids Other (See Comments)    Had acute  pancreatitis x 2 after steroid use.   Imitrex [Sumatriptan] Swelling    Injection caused swelling   Stadol [Butorphanol] Other (See Comments)    hallucinates   Topamax [Topiramate] Other (See Comments)    Kidney stones   Vancomycin Hives   Azathioprine Rash   Pepcid [Famotidine] Nausea And Vomiting    Makes acid worse   Prednisone Nausea And Vomiting    All steroids causes nausea and acute pancreatitis     Social History   Socioeconomic History   Marital status: Married    Spouse name: Not on file   Number of children: 2   Years of education: some college   Highest education level: Not on file  Occupational History   Occupation: Self-employed  Tobacco Use   Smoking status: Never   Smokeless tobacco: Never  Vaping Use   Vaping Use: Never used  Substance and Sexual Activity   Alcohol use: No   Drug use: No   Sexual activity: Yes  Other Topics Concern   Not on file  Social History Narrative   Lives at home with her husband.   Right-handed.   No caffeine use.   Social Determinants of Health   Financial Resource Strain: Not on file  Food Insecurity: Not on file  Transportation Needs: Not on file  Physical Activity: Not on file  Stress: Not on file  Social Connections: Not on file        Objective:  Physical Exam: BP 121/83   Pulse 80   Temp 97.8 F (36.6 C) (Temporal)   Ht 5' (1.524 m)   Wt 153 lb (69.4 kg)   LMP 03/03/2013   SpO2 97%   BMI 29.88 kg/m   Body mass index is 29.88 kg/m. Wt Readings from Last 3 Encounters:  09/18/22 153 lb (69.4 kg)  08/21/22 149 lb (67.6 kg)  08/17/22 150 lb (68 kg)   Gen: NAD, resting comfortably HEENT: TMs normal bilaterally. OP clear. No thyromegaly noted.  CV: RRR with no murmurs appreciated Pulm: NWOB, CTAB with no crackles, wheezes, or rhonchi GI: Normal bowel sounds present. Soft, Nontender, Nondistended. MSK: no edema, cyanosis, or clubbing noted Skin: warm, dry Neuro: CN2-12 grossly intact. Strength 5/5  in upper and lower extremities. Reflexes symmetric and intact bilaterally.  Psych: Normal affect and thought  content     Daquawn Seelman M. [name], MD 09/18/2022 10:52 AM

## 2022-09-18 NOTE — Assessment & Plan Note (Signed)
On Synthroid 100 mcg daily.  Check TSH.

## 2022-09-19 ENCOUNTER — Other Ambulatory Visit: Payer: BC Managed Care – PPO

## 2022-09-20 ENCOUNTER — Encounter: Payer: Self-pay | Admitting: Family Medicine

## 2022-09-20 DIAGNOSIS — R7303 Prediabetes: Secondary | ICD-10-CM | POA: Insufficient documentation

## 2022-09-20 NOTE — Progress Notes (Signed)
Please inform patient of the following:  Her A1c is elevated in the prediabetic range.  Do not need to start meds but she should really focus on diet and exercise and we can recheck this in a year.  Cholesterol is up a little bit since last time.  Do not need to change meds for this but she should also continue to work on diet and exercise as above.  Vitamin D low.  Recommend that she continue supplementation and get at least 1000 to 2000 IUs once daily.  We can recheck this in 3 to 6 months.  Everything else is stable and we can recheck in a year.  [name]. [name], MD 09/20/2022 3:55 PM

## 2022-09-24 ENCOUNTER — Telehealth: Payer: Self-pay | Admitting: Family Medicine

## 2022-09-24 NOTE — Telephone Encounter (Signed)
Patient is requesting callback in regards to her lab results.

## 2022-09-24 NOTE — Telephone Encounter (Signed)
See results note. 

## 2022-09-25 NOTE — Telephone Encounter (Signed)
LVM with TSH results  normal limit

## 2022-09-28 ENCOUNTER — Ambulatory Visit: Payer: BC Managed Care – PPO | Admitting: Cardiology

## 2022-10-10 ENCOUNTER — Telehealth: Payer: Self-pay | Admitting: Family Medicine

## 2022-10-10 ENCOUNTER — Other Ambulatory Visit: Payer: BC Managed Care – PPO

## 2022-10-10 ENCOUNTER — Telehealth: Payer: Self-pay | Admitting: Gastroenterology

## 2022-10-10 NOTE — Telephone Encounter (Signed)
I'm happy to see her in the office for a new patient visit. Thanks 

## 2022-10-10 NOTE — Telephone Encounter (Signed)
Good Afternoon Dr Havery Moros  We have received a request from patient wanting to transfer care to you due to you being highly recommended by her provider. Patient would be transferring care from Dr Man.  Records are available for review in epics, please review and advise on scheduling.   Patient would need to be seen for Autoimmune  Ulcerative chronic pancolitis with rectal bleeding .

## 2022-10-10 NOTE — Telephone Encounter (Signed)
Just an FYI, pt wanted you to know that the office that the [facility] referral was sent dropped the ball and the information was never put in for Dr. Abbott Pao talked to a lady at front desk and made sure she gave the dr the information needed. She called this office and just wanted you to know what happened. She will reach out to t hem on Friday to make sure they are doing what they said they would do. Thank you.

## 2022-10-15 ENCOUNTER — Ambulatory Visit: Payer: BC Managed Care – PPO | Admitting: Cardiology

## 2022-10-15 ENCOUNTER — Encounter: Payer: Self-pay | Admitting: Cardiology

## 2022-10-15 VITALS — BP 140/90 | HR 79 | Ht 60.0 in | Wt 155.0 lb

## 2022-10-15 DIAGNOSIS — R002 Palpitations: Secondary | ICD-10-CM

## 2022-10-15 DIAGNOSIS — E782 Mixed hyperlipidemia: Secondary | ICD-10-CM

## 2022-10-15 DIAGNOSIS — I10 Essential (primary) hypertension: Secondary | ICD-10-CM

## 2022-10-15 MED ORDER — METOPROLOL SUCCINATE ER 25 MG PO TB24: 25.0000 mg | ORAL_TABLET | Freq: Every day | ORAL | 3 refills | Status: DC

## 2022-10-15 NOTE — Progress Notes (Signed)
Patient referred by Ardith Dark, MD for abnormal echocardiogram.  Subjective:   Danielle Bond, female    DOB: 13-Jul-1961, 62 y.o.   MRN: [id number]   Chief Complaint  Patient presents with   Palpitations   Follow-up   Danielle Bond is a 62 y.o. Caucasian female with hyperlipidemia, aortic and coronary atherrosclerosis, presyncope, Graves' disease, status post thyroidectomy, ulcerative colitis, h/o acute pancreatitis, chronic migraine.  She made an appointment to see [name] in view of recurrence of palpitations.   Patient's symptoms of palpitation with recurring mostly at rest or during routine activities and not with exertion activity. Denies any other associated symptoms. Lasts few seconds to < a min in duration.    Current Outpatient Medications:    albuterol (PROVENTIL) (2.5 MG/3ML) 0.083% nebulizer solution, Take 3 mLs (2.5 mg total) by nebulization every 6 (six) hours as needed for wheezing or shortness of breath., Disp: 150 mL, Rfl: 1   albuterol (VENTOLIN HFA) 108 (90 Base) MCG/ACT inhaler, TAKE 2 PUFFS BY MOUTH EVERY 6 HOURS AS NEEDED FOR WHEEZE OR SHORTNESS OF BREATH, Disp: 25.5 each, Rfl: 1   budesonide-formoterol (SYMBICORT) 160-4.5 MCG/ACT inhaler, Inhale 2 puffs into the lungs 2 (two) times daily., Disp: , Rfl:    cholecalciferol (VITAMIN D3) 25 MCG (1000 UNIT) tablet, Take 2,000 Units by mouth daily., Disp: , Rfl:    clotrimazole-betamethasone (LOTRISONE) cream, Apply 1 Application topically 2 (two) times daily., Disp: 45 g, Rfl: 1   CVS VITAMIN B12 1000 MCG tablet, Take 1,000 mcg by mouth daily., Disp: , Rfl:    EPINEPHRINE 0.3 mg/0.3 mL IJ SOAJ injection, INJECT 0.3 MLS (0.3 MG TOTAL) INTO THE MUSCLE AS NEEDED FOR ANAPHYLAXIS. (Patient taking differently: Inject 0.3 mg into the muscle once as needed for anaphylaxis.), Disp: 2 each, Rfl: 1   fluticasone (FLONASE) 50 MCG/ACT nasal spray, PLACE 2 SPRAYS INTO BOTH NOSTRILS DAILY AS NEEDED FOR ALLERGIES OR RHINITIS.,  Disp: 48 mL, Rfl: 1   folic acid (FOLVITE) 1 MG tablet, TAKE 1 TABLET BY MOUTH EVERY DAY FOR 30 DAYS (Patient taking differently: Take 1 mg by mouth daily.), Disp: 90 tablet, Rfl: 1   levothyroxine (SYNTHROID) 100 MCG tablet, Take 1 tablet (100 mcg total) by mouth daily., Disp: 90 tablet, Rfl: 3   lidocaine (XYLOCAINE) 5 % ointment, Apply 1 Application topically daily as needed., Disp: , Rfl:    Magnesium 500 MG TABS, Take 500 mg by mouth daily., Disp: , Rfl:    metoprolol succinate (TOPROL-XL) 25 MG 24 hr tablet, Take 1 tablet (25 mg total) by mouth daily. Take with or immediately following a meal., Disp: 90 tablet, Rfl: 3   Multiple Vitamin (MULTIVITAMIN WITH MINERALS) TABS tablet, Take 1 tablet by mouth daily., Disp: , Rfl:    NON FORMULARY, [place] Beet, Disp: , Rfl:    Omega 3-6-9 Fatty Acids (OMEGA 3-6-9 PO), Take 1 tablet by mouth daily., Disp: , Rfl:    pantoprazole (PROTONIX) 40 MG tablet, Take 30 mg by mouth daily., Disp: , Rfl:    RINVOQ 45 MG TB24, Take 1 tablet by mouth daily., Disp: , Rfl:    rosuvastatin (CRESTOR) 10 MG tablet, TAKE 1 TABLET BY MOUTH EVERY DAY, Disp: 90 tablet, Rfl: 3   valACYclovir (VALTREX) 500 MG tablet, Take 1 tablet by mouth daily as needed (breakouts)., Disp: , Rfl:    vitamin C (ASCORBIC ACID) 500 MG tablet, Take 500 mg by mouth daily., Disp: , Rfl:  Cardiovascular studies:  Mobile cardiac telemetry 13 days 06/29/2022 - 07/13/2022: Dominant rhythm: Sinus. HR 58-184 bpm. Avg HR 85 bpm. 0 episodes of SVT. <1% isolated SVE, couplets. 0 episodes of VT. <1% isolated VE.  No atrial fibrillation/atrial flutter/SVT/VT/high grade AV block, sinus pause >3sec noted. 3 patient triggered events, correlated with sinus rhythm.  EKG   EKG 10/15/2022: Normal sinus rhythm with rate of 75 bpm, normal EKG.  No significant change from 03/29/2022.   1. Palpitations Patient's symptoms of palpitation with recurring mostly at rest or during routine activities and not with  exertion activity Echocardiogram 11/09/2021:  Normal LV systolic function with visual EF 60-65%. Left ventricle cavity  is normal in size. Normal left ventricular wall thickness. Normal global  wall motion. Normal diastolic filling pattern, normal LAP.  Mild (Grade I) mitral regurgitation.  Mild tricuspid regurgitation. No evidence of pulmonary hypertension.  Compared to 04/28/2019 mild MR / TR are new otherwise no significant  change.   Exercise Sestamibi stress test 01/09/2021: Exercise nuclear stress test was performed using Bruce protocol. Patient reached 7 METS, and 106% of age predicted maximum heart rate. Exercise capacity was low. No chest pain reported. Heart rate and hemodynamic response were normal. Stress EKG revealed no ischemic changes. Normal myocardial perfusion. Stress LVEF 54%. Low risk study.  CTA chest 05/23/2018: No demonstrable pulmonary embolus. No thoracic aortic aneurysm or dissection. There is aortic atherosclerosis.  Right subclavian artery arises distally to the other great vessels, passing posteriorly to the esophagus and impressing upon the esophagus as it passes to the right.  No lung edema or consolidation.  No evident thoracic adenopathy. Aortic Atherosclerosis (ICD10-I70.0).  Recent labs:  Lab Results  Component Value Date   NA 139 09/18/2022   K 4.6 09/18/2022   CO2 27 09/18/2022   GLUCOSE 103 (H) 09/18/2022   BUN 13 09/18/2022   CREATININE 0.80 09/18/2022   CALCIUM 9.5 09/18/2022   GFRNONAA >60 01/25/2022       Latest Ref Rng & Units 09/18/2022   11:08 AM 06/28/2022   12:01 PM 01/30/2022    3:38 PM  CMP  Glucose 70 - 99 mg/dL 161  096  045   BUN 6 - 23 mg/dL 13  16  14    Creatinine 0.40 - 1.20 mg/dL 4.09  8.11  9.14   Sodium 135 - 145 mEq/L 139  138  136   Potassium 3.5 - 5.1 mEq/L 4.6  4.4  4.3   Chloride 96 - 112 mEq/L 103  103  101   CO2 19 - 32 mEq/L 27  30  27    Calcium 8.4 - 10.5 mg/dL 9.5  9.0  8.9   Total Protein 6.0 - 8.3 g/dL  7.6  7.4  7.4   Total Bilirubin 0.2 - 1.2 mg/dL 0.6  0.7  0.4   Alkaline Phos 39 - 117 U/L 48  50  60   AST 0 - 37 U/L 33  28  26   ALT 0 - 35 U/L 23  20  18        Latest Ref Rng & Units 09/18/2022   11:08 AM 06/28/2022   12:01 PM 01/30/2022    3:38 PM  CBC  WBC 4.0 - 10.5 K/uL 5.3  5.4  8.6   Hemoglobin 12.0 - 15.0 g/dL 78.2  95.6  21.3   Hematocrit 36.0 - 46.0 % 36.8  36.5  34.8   Platelets 150.0 - 400.0 K/uL 316.0  279.0  373.0  Lab Results  Component Value Date   CHOL 258 (H) 09/18/2022   HDL 96.50 09/18/2022   LDLCALC 142 (H) 09/18/2022   TRIG 96.0 09/18/2022   CHOLHDL 3 09/18/2022     HEMOGLOBIN A1C Lab Results  Component Value Date   HGBA1C 6.4 09/18/2022   TSH Recent Labs    03/27/22 1006 06/28/22 1201 09/18/22 1108  TSH 0.59 0.99 0.62   Lab Results  Component Value Date   HGBA1C 6.4 09/18/2022   Last vitamin D Lab Results  Component Value Date   VD25OH 26.21 (L) 09/18/2022    01/30/2022: Glucose 255, BUN/Cr 14/0.84. EGFR 75. Na/K 136/4.3. Rest of the CMP normal H/H 11/34. MCV 90. Platelets 373 HbA1C 6.3% TSH 6.7 high  06/2021: Chol 209, TG 78, HDL 93, LDL 100  Review of Systems  Cardiovascular:  Positive for palpitations. Negative for chest pain, dyspnea on exertion, leg swelling and syncope.      Vitals:   10/15/22 1600  BP: (!) 140/90  Pulse: 79  SpO2: 97%     Body mass index is 30.27 kg/m. Filed Weights   10/15/22 1600  Weight: 155 lb (70.3 kg)   Objective:   Physical Exam Constitutional:      Appearance: She is obese.  Neck:     Vascular: No carotid bruit or JVD.  Cardiovascular:     Rate and Rhythm: Normal rate and regular rhythm.     Pulses: Intact distal pulses.     Heart sounds: Normal heart sounds. No murmur heard.    No gallop.  Pulmonary:     Effort: Pulmonary effort is normal.     Breath sounds: Normal breath sounds.  Abdominal:     General: Bowel sounds are normal.     Palpations: Abdomen is soft.   Musculoskeletal:     Right lower leg: No edema.     Left lower leg: No edema.       Assessment & Recommendations:      ICD-10-CM   1. Palpitations  R00.2 EKG 12-Lead    metoprolol succinate (TOPROL-XL) 25 MG 24 hr tablet    2. Mixed hyperlipidemia  E78.2     3. White coat syndrome with diagnosis of hypertension  I10       SHERRIE SINGELTON is a 62 y.o. Caucasian female with hyperlipidemia, aortic and coronary atherrosclerosis, presyncope, Graves' disease, status post thyroidectomy, ulcerative colitis, h/o acute pancreatitis, chronic migraine.  She made an appointment to see [name] in view of recurrence of palpitations.  1. Palpitations Patient's symptoms of palpitation with recurring mostly at rest or during routine activities and not with exertion activity clearly indicate PACs and PVCs as the etiology and do not suspect atrial fibrillation or supraventricular tachycardia or complex ventricular arrhythmias.  There is no syncope or presyncope or other associated symptoms.  She is presently taking only metoprolol to tartrate 25 mg 1 tablet daily as she is unable to tolerate twice daily dosing due to fatigue.  Suspect beta-blocker withdrawal is causing palpitations in the evening hence we will change and switch to metoprolol succinate 25 mg daily for sustained effect.  - EKG 12-Lead - metoprolol succinate (TOPROL-XL) 25 MG 24 hr tablet; Take 1 tablet (25 mg total) by mouth daily. Take with or immediately following a meal.  Dispense: 90 tablet; Refill: 3  2. Mixed hyperlipidemia Patient has mixed hyperlipidemia, recent lipids reviewed, previously LDL was well-controlled however recently has been uncontrolled due to poor diet and patient is making lifestyle  changes.  If lipids do not improve, consider adding Zetia to Crestor 10 mg that she is presently on.  3. White coat syndrome with diagnosis of hypertension She brings home blood pressure recordings which are under excellent control.   Advised her to continue to monitor the blood pressure at various times during the daytime to see if blood pressure is well-controlled.  If blood pressure is not <130/80 mmHg, consider addition of ACE inhibitor or ARB.     Yates Decamp, MD, [facility] 10/17/2022, 4:01 AM Office: [phone] Fax: [phone] Pager: [phone]

## 2022-10-15 NOTE — Patient Instructions (Signed)
PAC = Premature atrial complexes: These arise from the upper chamber of the heart.  These are very common and are not dangerous.  Extra skipped beat coming from the upper chamber (atrium) and mostly are life altering (nuisance) than life threatening and mostly treated by reassurance. ° °There may not be any specific reasons for this, however patients with excessive caffeine, anxiety, lack of sleep, alcohol or thyroid problems can have these episodes.  °

## 2022-10-17 ENCOUNTER — Encounter: Payer: Self-pay | Admitting: Physician Assistant

## 2022-10-18 ENCOUNTER — Other Ambulatory Visit: Payer: Self-pay | Admitting: Gastroenterology

## 2022-10-18 ENCOUNTER — Ambulatory Visit
Admission: RE | Admit: 2022-10-18 | Discharge: 2022-10-18 | Disposition: A | Discharge status: Home / Self Care | Payer: BC Managed Care – PPO | Source: Ambulatory Visit | Attending: Gastroenterology | Admitting: Gastroenterology

## 2022-10-18 DIAGNOSIS — Z8719 Personal history of other diseases of the digestive system: Secondary | ICD-10-CM

## 2022-10-18 DIAGNOSIS — R140 Abdominal distension (gaseous): Secondary | ICD-10-CM

## 2022-10-18 NOTE — Telephone Encounter (Signed)
Called patient and left detailed message to call back to schedule. 

## 2022-10-24 ENCOUNTER — Encounter: Payer: Self-pay | Admitting: Gastroenterology

## 2022-10-24 ENCOUNTER — Ambulatory Visit (INDEPENDENT_AMBULATORY_CARE_PROVIDER_SITE_OTHER): Payer: BC Managed Care – PPO | Admitting: *Deleted

## 2022-10-24 DIAGNOSIS — B351 Tinea unguium: Secondary | ICD-10-CM

## 2022-10-24 NOTE — Patient Instructions (Signed)

## 2022-10-24 NOTE — Progress Notes (Signed)
Patient presents today for the 1st laser treatment. Diagnosed with mycotic nail infection by Dr. Amalia Hailey.   Toenail most affected hallux and 5th right.  All other systems are negative.  Nails were filed thin. Laser therapy was administered to 1-5 toenails right and patient tolerated the treatment well. All safety precautions were in place.   Single laser pass was done on non-affected nails.   Follow up in 4 weeks for laser # 2.

## 2022-10-30 ENCOUNTER — Telehealth: Payer: Self-pay

## 2022-10-30 NOTE — Telephone Encounter (Signed)
Spoke with patient and she is requesting an SI joint injection. Last injection was 10/2021. She stated it started bother her about 2-3 days ago. No new falls, accidents or injuries. Please advise

## 2022-10-31 ENCOUNTER — Other Ambulatory Visit: Payer: Self-pay | Admitting: Physical Medicine and Rehabilitation

## 2022-10-31 DIAGNOSIS — M461 Sacroiliitis, not elsewhere classified: Secondary | ICD-10-CM

## 2022-11-12 ENCOUNTER — Telehealth: Payer: Self-pay | Admitting: Physical Medicine and Rehabilitation

## 2022-11-12 NOTE — Telephone Encounter (Signed)
Patient states she missed a call and waiting to get an appointment. But no one left a message

## 2022-11-13 ENCOUNTER — Other Ambulatory Visit: Payer: Self-pay | Admitting: Obstetrics and Gynecology

## 2022-11-13 DIAGNOSIS — N6320 Unspecified lump in the left breast, unspecified quadrant: Secondary | ICD-10-CM

## 2022-11-13 DIAGNOSIS — N644 Mastodynia: Secondary | ICD-10-CM | POA: Diagnosis not present

## 2022-11-13 NOTE — Telephone Encounter (Signed)
IC, no answer. LMVM to return call.

## 2022-11-14 ENCOUNTER — Telehealth: Payer: Self-pay | Admitting: Physical Medicine and Rehabilitation

## 2022-11-14 NOTE — Telephone Encounter (Signed)
Patient advising she missed a call, please call

## 2022-11-14 NOTE — Telephone Encounter (Signed)
Spoke with patient and scheduled injection for 11/22/22. Patient aware driver needed

## 2022-11-16 ENCOUNTER — Telehealth: Payer: Self-pay | Admitting: Family Medicine

## 2022-11-16 NOTE — Telephone Encounter (Signed)
Prescription Request  11/16/2022  LOV: 09/18/2022  What is the name of the medication or equipment?  EPINEPHRINE 0.3 mg/0.3 mL IJ SOAJ injection   Have you contacted your pharmacy to request a refill? Yes   Which pharmacy would you like this sent to?  [id number] [place], [place] - [address] [place] [address] Phone: [phone] Fax: [phone]    Patient notified that their request is being sent to the clinical staff for review and that they should receive a response within 2 business days.   Please advise at Mobile [phone] (mobile)

## 2022-11-19 ENCOUNTER — Other Ambulatory Visit: Payer: Self-pay

## 2022-11-19 MED ORDER — EPINEPHRINE 0.3 MG/0.3ML IJ SOAJ: 0.3000 mg | INTRAMUSCULAR | 1 refills | Status: DC | PRN

## 2022-11-19 NOTE — Telephone Encounter (Signed)
Refill sent to pharmacy.   

## 2022-11-20 ENCOUNTER — Ambulatory Visit (INDEPENDENT_AMBULATORY_CARE_PROVIDER_SITE_OTHER): Payer: BC Managed Care – PPO

## 2022-11-20 DIAGNOSIS — Z23 Encounter for immunization: Secondary | ICD-10-CM | POA: Diagnosis not present

## 2022-11-20 NOTE — Progress Notes (Signed)
Pt is here for second shingles vaccines.

## 2022-11-21 ENCOUNTER — Telehealth: Payer: Self-pay | Admitting: Family Medicine

## 2022-11-21 NOTE — Telephone Encounter (Signed)
See note

## 2022-11-21 NOTE — Telephone Encounter (Signed)
Patient states:  - Recently visited cardiologist who asked if PCP could re-check patient's lipid panel in 3 months. States he saw the results following lipid panel for CPE and was concerned due to elevated levels   - She is going to eye doctor due to eye twitching for 3 weeks constantly; Wanted to make PCP aware

## 2022-11-21 NOTE — Telephone Encounter (Signed)
Ok to order labs? 

## 2022-11-21 NOTE — Telephone Encounter (Signed)
Final outcome: Home care   Patient Name: DACODA SPALLONE [facility] Gender: Female DOB: 10-Sep-1960 Age: 62 Y 17 D Return Phone Number: [phone] (Primary) Address: City/ State/ Zip: [place]  [address] Client [facility] - Administrator, [facility] Provider Jacquiline Doe- MD Contact Type Call Who Is Calling Patient / Member / Family / Caregiver Call Type Triage / Clinical Relationship To Patient Self Return Phone Number [phone] (Primary) Chief Complaint Immunization Reaction Reason for Call Symptomatic / Request for Health Information Initial Comment Caller states she got her second shingles injection yesterday. She is not having a welt where she received the shot. Translation No Nurse Assessment Nurse: Kizzie Bane, RN, Marylene Land Date/Time (Eastern Time): 11/21/2022 1:55:55 PM Confirm and document reason for call. If symptomatic, describe symptoms. ---Caller states she got her second shingles vaccine yesterday and she has some swelling at the injection site. No fever Does the patient have any new or worsening symptoms? ---Yes Will a triage be completed? ---Yes Related visit to physician within the last 2 weeks? ---No Does the PT have any chronic conditions? (i.e. diabetes, asthma, this includes High risk factors for pregnancy, etc.) ---Yes List chronic conditions. ---asthma, UC Is this a behavioral health or substance abuse call? ---No Guidelines Guideline Title Affirmed Question Affirmed Notes Nurse Date/Time (Eastern Time) Immunization Reactions Shingles (Herpes zoster; Shingrix) vaccine reactions Kizzie Bane, RN, Marylene Land 11/21/2022 1:56:47 PM Disp. Time Lamount Cohen Time) Disposition Final User 11/21/2022 2:00:41 PM Home Care Yes Kizzie Bane, RN, Marylene Land PLEASE NOTE: All timestamps contained within this report are represented as [place] Standard Time. CONFIDENTIALTY NOTICE: This fax transmission is intended only for  the addressee. It contains information that is legally privileged, confidential or otherwise protected from use or disclosure. If you are not the intended recipient, you are strictly prohibited from reviewing, disclosing, copying using or disseminating any of this information or taking any action in reliance on or regarding this information. If you have received this fax in error, please notify us immediately by telephone so that we can arrange for its return to [name]. Phone: [phone], Toll-Free: [phone], Fax: [phone] Page: 2 of 2 Call Id: [id number] Final Disposition 11/21/2022 2:00:41 PM Home Care Yes Kizzie Bane, RN, Rosalyn Charters Disagree/Comply Comply Caller Understands Yes PreDisposition Did not know what to do Care Advice Given Per Guideline HOME CARE: * You should be able to treat this at home. REASSURANCE AND EDUCATION - NORMAL REACTIONS: * Vaccines protect [name] against serious diseases. * Having some temporary symptoms from the shot is normal. SHINGLES (RECOMBINANT ZOSTER VACCINE; RZV; SHINGRIX) VACCINE - COMMON REACTIONS: * Pain, redness, or swelling at the injection site (80%). * Headache, muscle aches, fatigue. * Stomach pain or nausea. * Fever or chills. HEAT PACK FOR LOCAL REACTION AT VACCINE SITE: * Put a heat pack or warm wet washcloth on the vaccine shot area for 10 to 20 minutes. * Repeat as needed for the first 48 hours after the injection. * Reason: Improve blood flow to the area. Reduce the pain and swelling. * Note: Some pain, redness and swelling at the injection site are NORMAL. It means the vaccine is working. Some people find that a cold pack works better than heat; follow your doctor's advice. CALL BACK IF: * Redness or swelling lasts over 3 days * You become worse CARE ADVICE given per Immunization Reactions (Adult) guideline.

## 2022-11-21 NOTE — Telephone Encounter (Signed)
FYI: This call has been transferred to Access Nurse. Once the result note has been entered staff can address the message at that time.  Patient called in with the following symptoms:  Red Word: Swelling @ site of recent vaccine    Please advise at Mobile [phone] (mobile)  Message is routed to Provider Pool and [facility] Triage

## 2022-11-22 ENCOUNTER — Ambulatory Visit (INDEPENDENT_AMBULATORY_CARE_PROVIDER_SITE_OTHER): Payer: BC Managed Care – PPO | Admitting: Physical Medicine and Rehabilitation

## 2022-11-22 ENCOUNTER — Other Ambulatory Visit: Payer: Self-pay

## 2022-11-22 DIAGNOSIS — M461 Sacroiliitis, not elsewhere classified: Secondary | ICD-10-CM

## 2022-11-22 DIAGNOSIS — H9201 Otalgia, right ear: Secondary | ICD-10-CM | POA: Diagnosis not present

## 2022-11-22 DIAGNOSIS — M542 Cervicalgia: Secondary | ICD-10-CM | POA: Diagnosis not present

## 2022-11-22 NOTE — Progress Notes (Signed)
Functional Pain Scale - descriptive words and definitions  Distressing (6)    Pain is present/unable to complete most ADLs limited by pain/sleep is difficult and active distraction is only marginal. Moderate range order  Average Pain 7   +Driver, -BT, -Dye Allergies.  Right lower back pain that is radiating into right leg

## 2022-11-22 NOTE — Telephone Encounter (Signed)
Ok with me. Please place any necessary orders. 

## 2022-11-22 NOTE — Progress Notes (Signed)
Danielle Bond - 62 y.o. female MRN [id number]  Date of birth: 02/15/1961  Office Visit Note: Visit Date: 11/22/2022 PCP: Ardith Dark, MD Referred by: Ardith Dark, MD  Subjective: Chief Complaint  Patient presents with   Lower Back - Pain   HPI:  Danielle Bond is a 62 y.o. female who comes in today at the request of Ellin Goodie, FNP for planned Right anesthetic Sacroiliac joint arthrogram with fluoroscopic guidance.  The patient has failed conservative care including home exercise, medications, time and activity modification.  This injection will be diagnostic and hopefully therapeutic.  Please see requesting physician notes for further details and justification.   Positive Fortin finger sign, Patrick's testing, Gaenslen's  and lateral compression test.    ROS Otherwise per HPI.  Assessment & Plan: Visit Diagnoses: No diagnosis found.  Plan: No additional findings.   Meds & Orders: No orders of the defined types were placed in this encounter.  No orders of the defined types were placed in this encounter.   Follow-up: No follow-ups on file.   Procedures: Sacroiliac Joint Inj, Right on 11/22/2022 8:15 AM Indications: pain and diagnostic evaluation Details: 22 G 3.5 in needle, fluoroscopy-guided posterior approach Medications: 2 mL bupivacaine 0.5 %; 80 mg methylPREDNISolone acetate 80 MG/ML; 40 mg methylPREDNISolone acetate 40 MG/ML Outcome: tolerated well, no immediate complications  Sacroiliac Joint Intra-Articular Injection - Posterior Approach with Fluoroscopic Guidance   Position: PRONE  Additional Comments: Vital signs were monitored before and after the procedure. Patient was prepped and draped in the usual sterile fashion. The correct patient, procedure, and site was verified.   Injection Procedure Details:   Location/Site:  Sacroiliac joint  Needle size: 3.5 in Spinal Needle  Needle type: Spinal  Needle Placement:  Intra-articular  Findings:  -Comments: There was excellent flow of contrast producing a partial arthrogram of the sacroiliac joint.   Procedure Details: Starting with a 90 degree vertical and midline orientation the fluoroscope was tilted cranially 20 to 25 degrees and the target area of the inferior most part of the SI joint on the side mentioned above was visualized.  The soft tissues overlying this target were infiltrated with 4 ml. of 1% Lidocaine without Epinephrine. A #22 gauge spinal needle was inserted perpendicular to the fluoroscope table and advanced into the posterior inferior joint space using fluoroscopic guidance.  Position in the joint space was confirmed by obtaining a partial arthrogram using a 2 ml. volume of Isovue-250 contrast agent. After negative aspirate for gross pus or blood, the injectate was delivered to the joint. Radiographs were obtained for documentation purposes.   Additional Comments:   Dressing: Bandaid    Post-procedure details: Patient was observed during the procedure. Post-procedure instructions were reviewed.  Patient left the clinic in stable condition.    There was excellent flow of contrast producing a partial arthrogram of the sacroiliac joint.  Procedure, treatment alternatives, risks and benefits explained, specific risks discussed. Consent was given by the patient. Immediately prior to procedure a time out was called to verify the correct patient, procedure, equipment, support staff and site/side marked as required. Patient was prepped and draped in the usual sterile fashion.          Clinical History: MRI lumbar spine:   INDICATION: Right lower back pain   TECHNIQUE: Sagittal and axial T1 and T2-weighted sequences were performed. Additional sagittal STIR images were performed.   COMPARISON: None available   FINDINGS:  #  Vertebral bodies: No  compression fracture.  #  Alignment: Normal.  #  Marrow signal: No significant  abnormality.  #  Conus medullaris: Normal. Terminates at T12-L1 with no evidence of tethering.   #  T12-L1 mild bulging of the disc into the small disc protrusion on the left with mild impingement on the thecal sac.  #  L1-2: Mild disc desiccation.  #  L2-3: Mild bulging of the disc.  #  L3-4: Mild bulging of the discs.  #  L4-5: Mild bulging of the disc. Mild facet joint arthritis.  #  L5-S1: Mild bulging of the disc. Mild facet joint arthritis.    IMPRESSION:  Mild spondylosis and facet joint arthritis.  Mild bulging disc at several levels.  No herniated disc, spinal stenosis, or foraminal stenosis.   Electronically Signed by: Orlean Patten on 11/09/2020     Objective:  VS:  HT:    WT:   BMI:     BP:   HR: bpm  TEMP: ( )  RESP:  Physical Exam   Imaging: No results found.

## 2022-11-23 ENCOUNTER — Other Ambulatory Visit: Payer: Self-pay | Admitting: *Deleted

## 2022-11-23 DIAGNOSIS — G514 Facial myokymia: Secondary | ICD-10-CM | POA: Diagnosis not present

## 2022-11-23 DIAGNOSIS — E785 Hyperlipidemia, unspecified: Secondary | ICD-10-CM

## 2022-11-23 NOTE — Telephone Encounter (Signed)
LVM Lab order placed, please give [name] a call to schedule lab appt

## 2022-11-26 ENCOUNTER — Other Ambulatory Visit: Payer: BC Managed Care – PPO

## 2022-11-26 ENCOUNTER — Ambulatory Visit
Admission: RE | Admit: 2022-11-26 | Discharge: 2022-11-26 | Disposition: A | Discharge status: Home / Self Care | Payer: BC Managed Care – PPO | Source: Ambulatory Visit | Attending: Obstetrics and Gynecology | Admitting: Obstetrics and Gynecology

## 2022-11-26 DIAGNOSIS — N644 Mastodynia: Secondary | ICD-10-CM | POA: Diagnosis not present

## 2022-11-26 DIAGNOSIS — N6320 Unspecified lump in the left breast, unspecified quadrant: Secondary | ICD-10-CM

## 2022-11-30 ENCOUNTER — Ambulatory Visit (INDEPENDENT_AMBULATORY_CARE_PROVIDER_SITE_OTHER): Payer: BC Managed Care – PPO

## 2022-11-30 DIAGNOSIS — B351 Tinea unguium: Secondary | ICD-10-CM

## 2022-11-30 NOTE — Progress Notes (Signed)
Patient presents today for the 1st laser treatment. Diagnosed with mycotic nail infection by Dr. Logan Bores.   Toenail most affected hallux and 5th right.  All other systems are negative.  Nails were filed thin. Laser therapy was administered to 1-5 toenails right foot and patient tolerated the treatment well. All safety precautions were in place.   Single laser pass was done on non-affected nails.   Follow up in 4 weeks for laser # 3

## 2022-12-04 MED ORDER — BUPIVACAINE HCL 0.5 % IJ SOLN
2.0000 mL | INTRAMUSCULAR | Status: AC | PRN
Start: 2022-11-22 — End: 2022-11-22
  Administered 2022-11-22: 2 mL via INTRA_ARTICULAR

## 2022-12-04 MED ORDER — METHYLPREDNISOLONE ACETATE 80 MG/ML IJ SUSP
80.0000 mg | INTRAMUSCULAR | Status: AC | PRN
Start: 2022-11-22 — End: 2022-11-22
  Administered 2022-11-22: 80 mg via INTRA_ARTICULAR

## 2022-12-04 MED ORDER — METHYLPREDNISOLONE ACETATE 40 MG/ML IJ SUSP
40.0000 mg | INTRAMUSCULAR | Status: AC | PRN
Start: 2022-11-22 — End: 2022-11-22
  Administered 2022-11-22: 40 mg via INTRA_ARTICULAR

## 2022-12-18 ENCOUNTER — Ambulatory Visit (INDEPENDENT_AMBULATORY_CARE_PROVIDER_SITE_OTHER): Payer: BC Managed Care – PPO | Admitting: Family Medicine

## 2022-12-18 ENCOUNTER — Encounter: Payer: Self-pay | Admitting: Family Medicine

## 2022-12-18 VITALS — BP 132/89 | HR 90 | Temp 97.1°F | Ht 60.0 in | Wt 154.0 lb

## 2022-12-18 DIAGNOSIS — R7303 Prediabetes: Secondary | ICD-10-CM | POA: Diagnosis not present

## 2022-12-18 DIAGNOSIS — E039 Hypothyroidism, unspecified: Secondary | ICD-10-CM

## 2022-12-18 DIAGNOSIS — K51011 Ulcerative (chronic) pancolitis with rectal bleeding: Secondary | ICD-10-CM | POA: Diagnosis not present

## 2022-12-18 LAB — IBC + FERRITIN
Ferritin: 29 ng/mL (ref 10.0–291.0)
Iron: 88 ug/dL (ref 42–145)
Saturation Ratios: 24.3 % (ref 20.0–50.0)
TIBC: 362.6 ug/dL (ref 250.0–450.0)
Transferrin: 259 mg/dL (ref 212.0–360.0)

## 2022-12-18 LAB — COMPREHENSIVE METABOLIC PANEL
ALT: 18 U/L (ref 0–35)
AST: 29 U/L (ref 0–37)
Albumin: 4.2 g/dL (ref 3.5–5.2)
Alkaline Phosphatase: 54 U/L (ref 39–117)
BUN: 13 mg/dL (ref 6–23)
CO2: 27 mEq/L (ref 19–32)
Calcium: 9.2 mg/dL (ref 8.4–10.5)
Chloride: 103 mEq/L (ref 96–112)
Creatinine, Ser: 0.84 mg/dL (ref 0.40–1.20)
GFR: 74.63 mL/min (ref >60.00)
Glucose, Bld: 114 mg/dL — ABNORMAL HIGH (ref 70–99)
Potassium: 4.4 mEq/L (ref 3.5–5.1)
Sodium: 139 mEq/L (ref 135–145)
Total Bilirubin: 0.6 mg/dL (ref 0.2–1.2)
Total Protein: 7.1 g/dL (ref 6.0–8.3)

## 2022-12-18 LAB — TSH: TSH: 1.37 u[IU]/mL (ref 0.35–5.50)

## 2022-12-18 LAB — CBC
HCT: 37.1 % (ref 36.0–46.0)
Hemoglobin: 12.1 g/dL (ref 12.0–15.0)
MCHC: 32.6 g/dL (ref 30.0–36.0)
MCV: 91.9 fl (ref 78.0–100.0)
Platelets: 248 10*3/uL (ref 150.0–400.0)
RBC: 4.04 Mil/uL (ref 3.87–5.11)
RDW: 13.5 % (ref 11.5–15.5)
WBC: 4.9 10*3/uL (ref 4.0–10.5)

## 2022-12-18 LAB — SEDIMENTATION RATE: Sed Rate: 30 mm/hr (ref 0–30)

## 2022-12-18 LAB — HIGH SENSITIVITY CRP: CRP, High Sensitivity: 0.52 mg/L (ref 0.000–5.000)

## 2022-12-18 LAB — HEMOGLOBIN A1C: Hgb A1c MFr Bld: 6.1 % (ref 4.6–6.5)

## 2022-12-18 LAB — VITAMIN B12: Vitamin B-12: 763 pg/mL (ref 211–911)

## 2022-12-18 NOTE — Progress Notes (Signed)
   Danielle Bond is a 62 y.o. female who presents today for an office visit.  Assessment/Plan:  New/Acute Problems: Neck Pain No red flags.  Consistent with sternocleidomastoid strain.  We did discuss round of prednisone however she declined.  Also discussed referral to PT however she declined.  She will continue working on home exercises and let us know if not improving  Fatigue Broad differential.  She is having ongoing issues with poorly controlled ulcerative colitis which is likely the main contributor however we will check other labs to look for other possible causes today.  Check CBC, c-Met, TSH, A1c, sed rate, and CRP.  Chronic Problems Addressed Today: Prediabetes Check A1c.  She is working on lifestyle modifications.  Chronic ulcerative pancolitis (HCC) Has upcoming appoint with GI in a few weeks.  Currently prescribed rinvoq but she is not sure if this is effective.  Hypothyroidism s/p total thyroidectomy 1997 On Synthroid 100 mcg daily.  Check TSH.     Subjective:  HPI:  See Assessment / plan for status of chronic conditions.   Her main concern today is right ear pain. This has been going on for several weeks. She did see an ENT several weeks ago for this and she was told that she had some inflammation in her SCM and TMJ. She has tried putting ice and warm compresses on the area without much improvement. She feels like TMJ is well controlled. Pain is worse with head movement. She has been working on home stretches.   She has also had some more systemic symptoms the last few weeks including excessive sweating, fatigue, and malaise.  Also feels very thirsty.  This also started a few weeks ago.  She has been working on lifestyle modifications for her prediabetes.  She has noticed more puffiness in her hands.  She has had more issues with her ulcerative colitis as well and has had more bloating.  She is currently on Rinvoq but is not sure if it is effective.        Objective:  Physical Exam: BP 132/89   Pulse 90   Temp (!) 97.1 F (36.2 C) (Temporal)   Ht 5' (1.524 m)   Wt 154 lb (69.9 kg)   LMP 03/03/2013   SpO2 98%   BMI 30.08 kg/m   Gen: No acute distress, resting comfortably HEENT: TMs clear bilaterally.  Right EAC with small amount of irritation.  Right SCM very tender to palpation CV: Regular rate and rhythm with no murmurs appreciated Pulm: Normal work of breathing, clear to auscultation bilaterally with no crackles, wheezes, or rhonchi Neuro: Grossly normal, moves all extremities Psych: Normal affect and thought content      Danielle Bond M. Jimmey Ralph, MD 12/18/2022 10:31 AM

## 2022-12-18 NOTE — Assessment & Plan Note (Signed)
Check A1c.  She is working on lifestyle modifications.

## 2022-12-18 NOTE — Patient Instructions (Addendum)
It was very nice to see you today!  We will check blood work today.  Please keep working on exercises for your neck.  Let us know if not improving.h  Return if symptoms worsen or fail to improve.   Take care, Dr Jimmey Ralph  PLEASE NOTE:  If you had any lab tests, please let us know if you have not heard back within a few days. You may see your results on mychart before we have a chance to review them but we will give you a call once they are reviewed by [name].   If we ordered any referrals today, please let us know if you have not heard from their office within the next week.   If you had any urgent prescriptions sent in today, please check with the pharmacy within an hour of our visit to make sure the prescription was transmitted appropriately.   Please try these tips to maintain a healthy lifestyle:  Eat at least 3 REAL meals and 1-2 snacks per day.  Aim for no more than 5 hours between eating.  If you eat breakfast, please do so within one hour of getting up.   Each meal should contain half fruits/vegetables, one quarter protein, and one quarter carbs (no bigger than a computer mouse)  Cut down on sweet beverages. This includes juice, soda, and sweet tea.   Drink at least 1 glass of water with each meal and aim for at least 8 glasses per day  Exercise at least 150 minutes every week.

## 2022-12-18 NOTE — Assessment & Plan Note (Signed)
On Synthroid 100 mcg daily. Check TSH.  

## 2022-12-18 NOTE — Assessment & Plan Note (Signed)
Has upcoming appoint with GI in a few weeks.  Currently prescribed rinvoq but she is not sure if this is effective.

## 2022-12-19 NOTE — Progress Notes (Signed)
Her labs are all stable.  Thyroid is normal and blood sugar is better controlled than previous.  It is possible that her symptoms could be coming from her ulcerative colitis.  Do not need to make any changes with her treatment plan at this time.  She should let us know if symptoms do not improve.

## 2022-12-20 ENCOUNTER — Other Ambulatory Visit: Payer: Self-pay | Admitting: Family Medicine

## 2022-12-20 ENCOUNTER — Telehealth: Payer: Self-pay | Admitting: Family Medicine

## 2022-12-20 NOTE — Telephone Encounter (Signed)
Pt would like a call back with lab results.She states there was some lab results missing and wanted to make sure she was given all the results.

## 2022-12-21 NOTE — Telephone Encounter (Signed)
See results note. 

## 2023-01-10 ENCOUNTER — Telehealth: Payer: Self-pay | Admitting: Physical Medicine and Rehabilitation

## 2023-01-10 NOTE — Telephone Encounter (Signed)
Pt called requesting a call back from Dr Alvester Morin. Pt states the injection did not work and pains are worse. Please call pt at [phone].

## 2023-01-11 ENCOUNTER — Other Ambulatory Visit: Payer: BC Managed Care – PPO

## 2023-01-14 ENCOUNTER — Encounter: Payer: Self-pay | Admitting: Gastroenterology

## 2023-01-14 ENCOUNTER — Telehealth: Payer: Self-pay

## 2023-01-14 ENCOUNTER — Ambulatory Visit (INDEPENDENT_AMBULATORY_CARE_PROVIDER_SITE_OTHER): Payer: BC Managed Care – PPO | Admitting: Gastroenterology

## 2023-01-14 ENCOUNTER — Other Ambulatory Visit (INDEPENDENT_AMBULATORY_CARE_PROVIDER_SITE_OTHER): Payer: BC Managed Care – PPO

## 2023-01-14 VITALS — BP 130/88 | HR 92 | Ht 60.0 in | Wt 157.5 lb

## 2023-01-14 DIAGNOSIS — K51019 Ulcerative (chronic) pancolitis with unspecified complications: Secondary | ICD-10-CM

## 2023-01-14 DIAGNOSIS — K754 Autoimmune hepatitis: Secondary | ICD-10-CM

## 2023-01-14 DIAGNOSIS — R21 Rash and other nonspecific skin eruption: Secondary | ICD-10-CM

## 2023-01-14 DIAGNOSIS — Z79899 Other long term (current) drug therapy: Secondary | ICD-10-CM

## 2023-01-14 LAB — HEPATIC FUNCTION PANEL
ALT: 20 U/L (ref 0–35)
AST: 30 U/L (ref 0–37)
Albumin: 4.5 g/dL (ref 3.5–5.2)
Alkaline Phosphatase: 55 U/L (ref 39–117)
Bilirubin, Direct: 0.2 mg/dL (ref 0.0–0.3)
Total Bilirubin: 0.8 mg/dL (ref 0.2–1.2)
Total Protein: 8 g/dL (ref 6.0–8.3)

## 2023-01-14 MED ORDER — POLYETHYLENE GLYCOL 3350 17 G PO PACK: PACK | ORAL | 0 refills | Status: AC

## 2023-01-14 NOTE — Patient Instructions (Signed)
If your blood pressure at your visit was 140/90 or greater, please contact your primary care physician to follow up on this. ______________________________________________________  If you are age 62 or older, your body mass index should be between 23-30. Your Body mass index is 30.76 kg/m. If this is out of the aforementioned range listed, please consider follow up with your Primary Care Provider.  If you are age 20 or younger, your body mass index should be between 19-25. Your Body mass index is 30.76 kg/m. If this is out of the aformentioned range listed, please consider follow up with your Primary Care Provider.  ________________________________________________________  The [place] [facility] providers would like to encourage you to use [name] to communicate with providers for non-urgent requests or questions.  Due to long hold times on the telephone, sending your provider a message by [facility] may be a faster and more efficient way to get a response.  Please allow 48 business hours for a response.  Please remember that this is for non-urgent requests.  _______________________________________________________  Due to recent changes in healthcare laws, you may see the results of your imaging and laboratory studies on MyChart before your provider has had a chance to review them.  We understand that in some cases there may be results that are confusing or concerning to you. Not all laboratory results come back in the same time frame and the provider may be waiting for multiple results in order to interpret others.  Please give [name] 48 hours in order for your provider to thoroughly review all the results before contacting the office for clarification of your results.   Please go to the lab in the basement of our building to have lab work done as you leave today. Hit "B" for basement when you get on the elevator.  When the doors open the lab is on your left.  We will call you with the results. Thank you.  We are  referring you to [facility].  They will contact you directly to schedule an appointment.  It may take a week or more before you hear from them.  Please feel free to contact us if you have not heard from them within 2 weeks and we will follow up on the referral.    Please purchase the following medications over the counter and take as directed: Miralax: take once to twice daily  You have been scheduled for a follow up appointment on Wednesday, 05-01-23 at 8:10am. Please arrive 10 minutes early for registration. If you need to reschedule or cancel this appointment please call [phone] as soon as possible. Thank you.   Thank you for entrusting me with your care and for choosing [facility], Dr. Ileene Patrick

## 2023-01-14 NOTE — Telephone Encounter (Signed)
Rec'd fax reply from [facility] - they are no longer accepting referrals for new patients other than for lesions concerning for skin cancer or patients with hx of skin cancer.

## 2023-01-14 NOTE — Progress Notes (Signed)
HPI :  62 year old female with a history of ulcerative colitis, C. difficile, reported history of autoimmune hepatitis, here as a new patient to establish her care with [facility], referred by Jacquiline Doe, MD.  She has previously been seen by Dr. Pati Gallo of [facility] and Dr. Loreta Ave of [facility].  She is a new patient to me.  I reviewed her colitis history with her in short as below:  Ulcerative colitis history: Diagnosed in 2007.  Symptoms in the past have been cramping/twisting left lower quadrant pain, diarrhea, rectal bleeding.  She has been on Lialda and Apriso in the past which did not control her symptoms.  She was placed on Humira in March 2020 and did well on that regimen until July 2023 when she failed, admitted with a severe flare of her colitis-she had loss of weight, bleeding, diarrhea.  Treated with steroids, course complicated by C. difficile for which she had therapy for that recovered.  Of note it sounds like she had rash to Dificid at that time. Eventually transitioned to Rinvoq October 2023.  Of note it looks like she has an allergy reported to mesalamine, husband states it was not an allergy but she simply failed it.  She also has reported history of a rash to azathioprine in the past and hives to vancomycin.  She states she is been maintained on Rinvoq 30 mg daily.  She does not feel that things are going well for her in this regard.  She feels bloating in her abdomen and occasional left-sided pain.  She has no diarrhea but has constipation in recent days.  Using MiraLAX as needed for hard stools.  Not having any significant blood in the stool currently, this can happen on occasion.  She has had some skin rash on her face and on the side of her right foot, she is not sure if this related to her Rinvoq or not.  She also has some ulcerations in her mouth at times.  She is concerned her Rinvoq is not working too well and inquires about other options.  Her typical symptoms again of her  colitis, or abdominal pain, diarrhea, and feeling "inflamed".  Her last colonoscopy was in July 2023, as outlined below, when hospitalized, fair prep, pancolitis noted.  Fecal calprotectin was over 3000.  She has not had a follow-up fecal calprotectin or colonoscopy since that time.  She had labs done at the end of May that were reviewed, ESR 30 (11 months ago was 94).  CRP 0.52, iron studies normal, hemoglobin 12.1, WBC 4.9.  She states she is up-to-date with shingles vaccine, had this within the past year.  She is also up-to-date with flu shot and COVID-vaccine.  She denies any history of CAD, CHF, DVT.  No family history of colon cancer.  Her daughter has Crohn's disease.    Otherwise, on review of her chart she has a reported history of autoimmune hepatitis.  It looks like she was admitted to the hospital in April 2014 with an elevated ALT of 1200.  At that time she had a markedly elevated IgG, ANA, smooth muscle antibody, as well as an AMA.  She did not think she was taking any new medicines at the time other than Dilaudid given to her in the ED.  She had a liver biopsy at the time which was consistent with autoimmune hepatitis.  She was treated with steroids at that time but she states she is never had any follow-up for this  particular issue.  Her liver enzymes appear normal over the past year with a normal ALT.  In fact she had a CT scan in July of last year during time of her colitis flare which showed a normal-appearing liver.  She denies any family history of liver disease.  She is never seen a hematologist.  She has seen rheumatologist in the past for joint pains.  She endorses a rash on her right side of her foot that can be somewhat pruritic.  She seen dermatology in the past but that clinic has since closed and she is in need of her dermatologist.   Most recent workup: CT scan 01/24/22: IMPRESSION: 1. Diffuse colitis which is moderate to marked. Findings are nonspecific and may represent  an infectious or inflammatory. Correlate with any history of recent antibiotic administration and symptoms that would suggest C difficile colitis and consider close follow-up given the severity of the above findings. GI consultation may be helpful. 2. Suggestion of mild hepatic steatosis. 3. Post cholecystectomy without biliary duct dilation. 4. Aortic atherosclerosis.  Colonoscopy 01/25/2022 - Preparation of the colon was fair. - The examined portion of the ileum was normal. - Congested, erythematous, friable (with contact bleeding), hemorrhagic, inflamed, ulcerated and vascular-pattern-decreased mucosa in the entire examined colon. Biopsied.  Fecal calprotectin 01/2022 - > 3000   Labs in May 12/18/22- reviewed in Epic  ESR 30 CRP 0.520 A1c 6.4 Iron 88, ferritin 29, iron sat 24.3%, TIBC 362 B12 763 TSH 1.37 BUN 13, Cr 0.84 AP 54, AST 29, ALT 18, T prot 7.1, T bil 0.6, alb 4.2 WBC 4.9, Hgb 12.1, HCT 37.1, plt 248  Lipids 09/18/22: trig 96, HDL 96, LDL 142   Echo 11/12/21: EF 60-65%, grade I MR, mild TR , no pulm HTN  Past Medical History:  Diagnosis Date   Asthma    Chronic kidney disease    Fatty liver 2023   CT   Hx of pancreatitis 06/16/2018   Kidney stone    Kidney stone    Migraine    Orthostatic hypotension    Osteopenia 03/31/2012   Recurrent upper respiratory infection (URI)    Ulcerative colitis (HCC) 03/31/2012   Urticaria      Past Surgical History:  Procedure Laterality Date   BIOPSY  01/25/2022   Procedure: BIOPSY;  Surgeon: Kerin Salen, MD;  Location: [facility];  Service: Gastroenterology;;   CESAREAN SECTION     x3   CHOLECYSTECTOMY     COLONOSCOPY     COLONOSCOPY N/A 01/25/2022   Procedure: COLONOSCOPY;  Surgeon: Kerin Salen, MD;  Location: [facility];  Service: Gastroenterology;  Laterality: N/A;   LITHOTRIPSY     THYROIDECTOMY     1997   Family History  Problem Relation Age of Onset   Heart disease Mother    Dementia Mother     Thyroid disease Mother    Osteoarthritis Mother    Asthma Mother    Heart attack Father    Heart attack Brother    Heart disease Brother    Heart disease Paternal Grandmother    Cervical cancer Paternal Aunt    Breast cancer Cousin    Colon cancer Other    Stomach cancer Neg Hx    Rectal cancer Neg Hx    Esophageal cancer Neg Hx    Liver cancer Neg Hx    Pancreatic cancer Neg Hx    Social History   Tobacco Use   Smoking status: Never   Smokeless tobacco: Never  Vaping Use   Vaping Use: Never used  Substance Use Topics   Alcohol use: No   Drug use: No   Current Outpatient Medications  Medication Sig Dispense Refill   albuterol (PROVENTIL) (2.5 MG/3ML) 0.083% nebulizer solution Take 3 mLs (2.5 mg total) by nebulization every 6 (six) hours as needed for wheezing or shortness of breath. 150 mL 1   albuterol (VENTOLIN HFA) 108 (90 Base) MCG/ACT inhaler TAKE 2 PUFFS BY MOUTH EVERY 6 HOURS AS NEEDED FOR WHEEZE OR SHORTNESS OF BREATH 25.5 each 1   budesonide-formoterol (SYMBICORT) 160-4.5 MCG/ACT inhaler Inhale 2 puffs into the lungs 2 (two) times daily.     cholecalciferol (VITAMIN D3) 25 MCG (1000 UNIT) tablet Take 2,000 Units by mouth daily.     CVS VITAMIN B12 1000 MCG tablet Take 1,000 mcg by mouth daily.     EPINEPHrine 0.3 mg/0.3 mL IJ SOAJ injection Inject 0.3 mg into the muscle as needed for anaphylaxis. 2 each 1   fluticasone (FLONASE) 50 MCG/ACT nasal spray PLACE 2 SPRAYS INTO BOTH NOSTRILS DAILY AS NEEDED FOR ALLERGIES OR RHINITIS. 48 mL 1   folic acid (FOLVITE) 1 MG tablet TAKE 1 TABLET BY MOUTH EVERY DAY FOR 30 DAYS (Patient taking differently: Take 1 mg by mouth daily.) 90 tablet 1   lidocaine (XYLOCAINE) 5 % ointment Apply 1 Application topically daily as needed.     Magnesium 500 MG TABS Take 500 mg by mouth daily.     Multiple Vitamin (MULTIVITAMIN WITH MINERALS) TABS tablet Take 1 tablet by mouth daily.     NON FORMULARY [place] Beet     Omega 3-6-9 Fatty Acids (OMEGA  3-6-9 PO) Take 1 tablet by mouth daily.     pantoprazole (PROTONIX) 40 MG tablet Take 30 mg by mouth daily.     RINVOQ 30 MG TB24 Take 1 tablet by mouth daily.     rosuvastatin (CRESTOR) 10 MG tablet TAKE 1 TABLET BY MOUTH EVERY DAY 90 tablet 3   SYNTHROID 100 MCG tablet TAKE 1 TABLET BY MOUTH EVERY DAY IN THE MORNING 30 tablet 5   valACYclovir (VALTREX) 500 MG tablet Take 1 tablet by mouth daily as needed (breakouts).     vitamin C (ASCORBIC ACID) 500 MG tablet Take 500 mg by mouth daily.     No current facility-administered medications for this visit.   Allergies  Allergen Reactions   Almond Oil Hives and Shortness Of Breath   Lialda [Mesalamine] Other (See Comments)    Vomiting and rectal bleeding   Other Anaphylaxis    Almonds, tree nuts   Aspirin Hives and Nausea Only    Stomach cramps   Hydromorphone Hcl Other (See Comments)    Caused Enzyme levels to go up   Penicillins Hives and Swelling    Has patient had a PCN reaction causing immediate rash, facial/tongue/throat swelling, SOB or lightheadedness with hypotension: Yes Has patient had a PCN reaction causing severe rash involving mucus membranes or skin necrosis: No Has patient had a PCN reaction that required hospitalization: No Has patient had a PCN reaction occurring within the last 10 years: No  Stomach cramps If all of the above answers are "NO", then may proceed with Cephalosporin use.    Acetaminophen Other (See Comments)    Has autoimmune hepatitis, wants to avoid APAP   Apriso [Mesalamine Er] Nausea And Vomiting   Corticosteroids Other (See Comments)    Had acute pancreatitis x 2 after steroid use.   Imitrex [Sumatriptan] Swelling  Injection caused swelling   Stadol [Butorphanol] Other (See Comments)    hallucinates   Topamax [Topiramate] Other (See Comments)    Kidney stones   Vancomycin Hives   Azathioprine Rash   Pepcid [Famotidine] Nausea And Vomiting    Makes acid worse   Prednisone Nausea And  Vomiting    All steroids causes nausea and acute pancreatitis      Review of Systems: All systems reviewed and negative except where noted in HPI.   Labs per HPI  Physical Exam: BP 130/88   Pulse 92   Ht 5' (1.524 m)   Wt 157 lb 8 oz (71.4 kg)   LMP 03/03/2013   SpO2 98%   BMI 30.76 kg/m  Constitutional: Pleasant,well-developed, female in no acute distress. HEENT: Normocephalic and atraumatic. Conjunctivae are normal. No scleral icterus. Neck supple.  Cardiovascular: Normal rate, regular rhythm.  Pulmonary/chest: Effort normal and breath sounds normal.  Abdominal: Soft, nondistended, nontender.  There are no masses palpable. No hepatomegaly. Extremities: no edema Lymphadenopathy: No cervical adenopathy noted. Neurological: Alert and oriented to person place and time. Skin: Skin is warm and dry. Mild dry / flaking skin on R lateral foot, no vesicles / pustules Psychiatric: Normal mood and affect. Behavior is normal.   ASSESSMENT: 62 y.o. female here for assessment of the following  1. Chronic ulcerative pancolitis with complication (HCC)   2. Autoimmune hepatitis (HCC)   3. High risk medication use    New patient to me, accompanied by her husband today.  We had a very good discussion about her history at length.  She has failed mesalamine monotherapy, failed Humira, intolerant to thiopurine's.  Course complicated by C. difficile.  Has been on Rinvoq since October of last year without any flares /hospitalization.  She continues to have some bloating and abdominal pain at times but no further diarrhea or rectal bleeding.  Her inflammatory markers are normal, normal iron studies, normal hemoglobin.  She was concerned her Rinvoq is not working well.    I think we need to objectively evaluate whether or not Rinvoq is working well for her, however in the absence of significant bleeding or diarrhea, and her recent labs, I suspect it may be working fairly well for her colon. Her  symptoms may more likely due to constipation. We discussed options to evaluate this initially.  I think reasonable to do a fecal calprotectin as this has been quite elevated for her in the past when flaring.  If the calprotectin is normal then it is very likely that there is no significant active inflammation.  We also discussed potential role of colonoscopy and that may be needed as well, will await fecal calprotectin first.  I would like to have more information before considering changing any therapy for her.  Especially given failure of other drugs in the past, would only want to change Rinvoq if it is not working or she does not tolerate it or having side effects.  I discussed long-term risks of Rinvoq with her -she understands she is at risk for immunosuppression.  She has no heart disease or blood clots in the past etc.  She is up-to-date with Shingrix vaccine as well as the rest of her immunizations at this time.  Again if there is active inflammation noted on her workup or she is not tolerating Rinvoq, we can discuss other options.  We briefly discussed other options in general today to treat colitis.  She would not want to have any fusion, would  prefer injection or oral therapy.  The rash I can see on her foot today, not convinced this is related to Rinvoq but I need assistance from dermatology to evaluate this.  I will refer her to dermatology.  She can try some topical antifungal cream in the interim to treat empirically for fungal infection.    In regards to her symptoms of constipation, that could be causing her bloating.  Recommend MiraLAX daily to twice daily every day and titrate up or down to treat constipation initially.  If she is not happy with this we can try something else.  I am suspicious that this could be more likely causing her symptoms and active colitis right now but will await her course.  Otherwise, I reviewed her history of autoimmune hepatitis.  She has not been on anything  for years for this and her liver enzymes are normal and she had imaging of her liver last year showing no cirrhotic change.  Will repeat her LFTs now and repeat some of her autoimmune labs to see where these are trending.  If her labs are positive, I may refer her to hepatology for their opinion.  If her ALT is normal however with no concerning evidence of cirrhosis, would likely monitor for now.  Unclear if she was taking any medicines at the time of that evaluation that could have been related.   PLAN: - lab today - LFTs, IgG, ANA, smooth muscle antibody, anti mitochrondrial AB - lab for fecal calprotectin - Miralax daily to BID to treat constipation - refer to dermatology [facility] for rash - rule out side effect from Rinvoq - we discussed Rinvoq in general - long term risks, and other options, would like more information back from labs or colonoscopy before we make a decision about transitioning to something else. Continuing 30mg  / day for now - discussed history of AIH - labs have been okay over time, may need Hepatology evaluation if labs abnormal - Shingles / flu / COVID vaccine UTD - f/u 3 months or sooner with issues  I spent 70 minutes of time, including in depth chart review, independent review of results as outlined above, face-to-face time with the patient, and documentation.    Harlin Rain, MD [facility]  CC: Ardith Dark, MD

## 2023-01-14 NOTE — Telephone Encounter (Signed)
LVM to return call to clinic.

## 2023-01-14 NOTE — Telephone Encounter (Signed)
Referral faxed to [facility] ([place])

## 2023-01-14 NOTE — Telephone Encounter (Signed)
Referral sent to [facility]

## 2023-01-15 ENCOUNTER — Ambulatory Visit: Payer: BC Managed Care – PPO

## 2023-01-15 DIAGNOSIS — K51019 Ulcerative (chronic) pancolitis with unspecified complications: Secondary | ICD-10-CM | POA: Diagnosis not present

## 2023-01-15 DIAGNOSIS — K754 Autoimmune hepatitis: Secondary | ICD-10-CM | POA: Diagnosis not present

## 2023-01-15 DIAGNOSIS — Z79899 Other long term (current) drug therapy: Secondary | ICD-10-CM | POA: Diagnosis not present

## 2023-01-15 LAB — IGG: IgG (Immunoglobin G), Serum: 1456 mg/dL (ref 600–1540)

## 2023-01-16 LAB — ANTI-NUCLEAR AB-TITER (ANA TITER): ANA Titer 1: 1:320 {titer} — ABNORMAL HIGH

## 2023-01-17 ENCOUNTER — Encounter: Payer: Self-pay | Admitting: Family Medicine

## 2023-01-17 ENCOUNTER — Ambulatory Visit (INDEPENDENT_AMBULATORY_CARE_PROVIDER_SITE_OTHER): Payer: BC Managed Care – PPO | Admitting: Family Medicine

## 2023-01-17 VITALS — BP 150/90 | HR 85 | Temp 97.8°F | Ht 60.0 in | Wt 154.0 lb

## 2023-01-17 DIAGNOSIS — E785 Hyperlipidemia, unspecified: Secondary | ICD-10-CM

## 2023-01-17 DIAGNOSIS — K51019 Ulcerative (chronic) pancolitis with unspecified complications: Secondary | ICD-10-CM | POA: Diagnosis not present

## 2023-01-17 DIAGNOSIS — J329 Chronic sinusitis, unspecified: Secondary | ICD-10-CM | POA: Diagnosis not present

## 2023-01-17 DIAGNOSIS — N6320 Unspecified lump in the left breast, unspecified quadrant: Secondary | ICD-10-CM | POA: Diagnosis not present

## 2023-01-17 DIAGNOSIS — H6993 Unspecified Eustachian tube disorder, bilateral: Secondary | ICD-10-CM

## 2023-01-17 MED ORDER — AZITHROMYCIN 250 MG PO TABS: ORAL_TABLET | ORAL | 0 refills | Status: DC

## 2023-01-17 NOTE — Assessment & Plan Note (Signed)
Person due to recent sinus infection.  Has some signs of otitis media today as well.  Should have improvement with azithromycin.  She will let us know if not improving.

## 2023-01-17 NOTE — Assessment & Plan Note (Signed)
Recently established with GI.  Overall symptoms have been stable.  She is on Rinvoq 30 mg daily.

## 2023-01-17 NOTE — Progress Notes (Signed)
   Danielle Bond is a 62 y.o. female who presents today for an office visit.  Assessment/Plan:  New/Acute Problems: Sinusitis  No red flags.  No signs of systemic infection.  Given length of symptoms would be reasonable to start antibiotics at this point.  She has penicillin allergy.  Will start azithromycin.  This has worked well for her in the past.  Encouraged hydration.  She can continue using over-the-counter meds as needed as well.  We discussed reasons to return to care.  Follow-up as needed.  Chronic Problems Addressed Today: Chronic ulcerative pancolitis (HCC) Recently established with GI.  Overall symptoms have been stable.  She is on Rinvoq 30 mg daily.  Dyslipidemia LDL 136 on recent labs.  Will recheck again in 6 months or so.  Continue Crestor 10 mg daily.  Eustachian tube dysfunction Person due to recent sinus infection.  Has some signs of otitis media today as well.  Should have improvement with azithromycin.  She will let us know if not improving.     Subjective:  HPI:  See Assessment / plan for status of chronic conditions. Patient here with sinus pressure and headache for the last 10 days.  Also some ear drainage and itching.  She has been trying using nasal lavage at home with green drainage.  No fevers or chills. She has noticed more hoarseness. More right side throat irritation. Getting worse the last few days.        Objective:  Physical Exam: BP (!) 150/90 (BP Location: Left Arm, Patient Position: Sitting, Cuff Size: Normal)   Pulse 85   Temp 97.8 F (36.6 C) (Temporal)   Ht 5' (1.524 m)   Wt 154 lb (69.9 kg)   LMP 03/03/2013   SpO2 97%   BMI 30.08 kg/m   Gen: No acute distress, resting comfortably HEENT: Left TM clear.  Right TM erythematous with effusion.  OP erythematous with cobblestoning on right OP.  Nasal mucosa there erythematous and boggy bilaterally. CV: Regular rate and rhythm with no murmurs appreciated Pulm: Normal work of breathing,  clear to auscultation bilaterally with no crackles, wheezes, or rhonchi Neuro: Grossly normal, moves all extremities Psych: Normal affect and thought content      Sheala Dosh M. Jimmey Ralph, MD 01/17/2023 12:32 PM

## 2023-01-17 NOTE — Patient Instructions (Addendum)
It was very nice to see you today!  You have a sinus infection.  Will start antibiotic.  Let us know if not improving.  Return if symptoms worsen or fail to improve.   Take care, Dr Jimmey Ralph  PLEASE NOTE:  If you had any lab tests, please let us know if you have not heard back within a few days. You may see your results on mychart before we have a chance to review them but we will give you a call once they are reviewed by [name].   If we ordered any referrals today, please let us know if you have not heard from their office within the next week.   If you had any urgent prescriptions sent in today, please check with the pharmacy within an hour of our visit to make sure the prescription was transmitted appropriately.   Please try these tips to maintain a healthy lifestyle:  Eat at least 3 REAL meals and 1-2 snacks per day.  Aim for no more than 5 hours between eating.  If you eat breakfast, please do so within one hour of getting up.   Each meal should contain half fruits/vegetables, one quarter protein, and one quarter carbs (no bigger than a computer mouse)  Cut down on sweet beverages. This includes juice, soda, and sweet tea.   Drink at least 1 glass of water with each meal and aim for at least 8 glasses per day  Exercise at least 150 minutes every week.

## 2023-01-17 NOTE — Assessment & Plan Note (Signed)
LDL 136 on recent labs.  Will recheck again in 6 months or so.  Continue Crestor 10 mg daily.

## 2023-01-18 ENCOUNTER — Ambulatory Visit (INDEPENDENT_AMBULATORY_CARE_PROVIDER_SITE_OTHER): Payer: BC Managed Care – PPO | Admitting: *Deleted

## 2023-01-18 DIAGNOSIS — B351 Tinea unguium: Secondary | ICD-10-CM

## 2023-01-18 LAB — CALPROTECTIN, FECAL: Calprotectin, Fecal: 129 ug/g — ABNORMAL HIGH (ref 0–120)

## 2023-01-18 NOTE — Progress Notes (Signed)
Patient presents today for the 3rd laser treatment. Diagnosed with mycotic nail infection by Dr. Logan Bores.   Toenail most affected hallux and 5th right.  All other systems are negative.  Nails were filed thin. Laser therapy was administered to 1-5 toenails right foot and patient tolerated the treatment well. All safety precautions were in place.   Single laser pass was done on non-affected nails.   Follow up in 6 weeks for laser # 4

## 2023-01-19 LAB — ANTI-SMOOTH MUSCLE ANTIBODY, IGG: Actin (Smooth Muscle) Antibody (IGG): 22 U — ABNORMAL HIGH (ref <20)

## 2023-01-19 LAB — ANA: Anti Nuclear Antibody (ANA): POSITIVE — AB

## 2023-01-19 LAB — MITOCHONDRIAL ANTIBODIES: Mitochondrial M2 Ab, IgG: <=20 U (ref <=20.0)

## 2023-01-19 LAB — ANTI-NUCLEAR AB-TITER (ANA TITER)

## 2023-01-21 ENCOUNTER — Telehealth: Payer: Self-pay

## 2023-01-21 ENCOUNTER — Encounter: Payer: Self-pay | Admitting: *Deleted

## 2023-01-21 ENCOUNTER — Other Ambulatory Visit: Payer: Self-pay | Admitting: *Deleted

## 2023-01-21 DIAGNOSIS — K51019 Ulcerative (chronic) pancolitis with unspecified complications: Secondary | ICD-10-CM

## 2023-01-21 MED ORDER — NA SULFATE-K SULFATE-MG SULF 17.5-3.13-1.6 GM/177ML PO SOLN: ORAL | 0 refills | Status: DC

## 2023-01-21 NOTE — Telephone Encounter (Signed)
Called [facility] - LM checking on status of referral

## 2023-01-21 NOTE — Telephone Encounter (Signed)
Patient called in stating the injection did not work. LVM to return call to clinic

## 2023-01-22 ENCOUNTER — Telehealth: Payer: Self-pay

## 2023-01-22 NOTE — Telephone Encounter (Signed)
Patient has an appointment with [facility] on 04-18-23 at 2:15pm

## 2023-01-22 NOTE — Telephone Encounter (Signed)
error 

## 2023-01-25 ENCOUNTER — Telehealth: Payer: Self-pay | Admitting: Physical Medicine and Rehabilitation

## 2023-01-25 ENCOUNTER — Telehealth: Payer: Self-pay | Admitting: Gastroenterology

## 2023-01-25 NOTE — Telephone Encounter (Signed)
Inbound call from patient returning phone call. Requesting a call back. Please advise, thank you.  

## 2023-01-25 NOTE — Telephone Encounter (Signed)
Left message again for patient to call back

## 2023-01-25 NOTE — Telephone Encounter (Signed)
Pt called requesting have a call as soon  as possible Monday morning. Pt is experiencing sever pains in SI joint to right leg. Pt had injection April 2024. Please call pt at [phone] main number call this number first or [phone] cell.

## 2023-01-25 NOTE — Telephone Encounter (Signed)
Inbound call from patient requesting to speak with nurse Dottie regarding her recent lab work. Please advise.

## 2023-01-25 NOTE — Telephone Encounter (Signed)
I have spoken to patient to reiterate her recent labs and recommendations as per Dr Adela Lank. Patient verbalizes understanding of the information and denies any additional questions.

## 2023-01-25 NOTE — Telephone Encounter (Signed)
Left message for patient to call back  

## 2023-01-29 NOTE — Telephone Encounter (Signed)
[facility] at [phone] and LM for status on referral

## 2023-01-29 NOTE — Telephone Encounter (Signed)
LVM to return call to ask if injection did or did not help

## 2023-02-04 ENCOUNTER — Telehealth: Payer: Self-pay | Admitting: Podiatry

## 2023-02-04 NOTE — Telephone Encounter (Signed)
Patient called.  States she cancelled her appt. On the 17th and rescheduled in August.  The rescheduled appt is only a few days prior to her laser appointment.    Her question is, "What do I do in the meantime about my crumbling toenails?"  She stated she cannot take oral medication.  Callback [phone]

## 2023-02-06 ENCOUNTER — Ambulatory Visit: Payer: BC Managed Care – PPO | Admitting: Podiatry

## 2023-02-06 ENCOUNTER — Telehealth: Payer: Self-pay | Admitting: Physical Medicine and Rehabilitation

## 2023-02-06 NOTE — Telephone Encounter (Signed)
Pt called trying to schedule appt please call home phone on file pt states she has been trying to call for a month and she has been in severe pain not being able to sleep at night I notified her that [place] was alone and she understands

## 2023-02-07 ENCOUNTER — Encounter: Payer: Self-pay | Admitting: Podiatry

## 2023-02-08 ENCOUNTER — Telehealth: Payer: Self-pay | Admitting: Gastroenterology

## 2023-02-08 NOTE — Telephone Encounter (Signed)
Her last labs looked good with significant improvement of fecal calprotectin next month. She is getting a colonoscopy within a few weeks to determine if she is in remission or not. Most people have diarrhea with a flare.  Continue Miralax for now, if she is not completely evaluated can take double dose three times daily if needed. Can add Bentyl 10mg  every 8 hours for pain. We can refill the Rinvoq at 30mg  / day - 90 day supply. Let's see how she does over the weekend. Hoping to avoid steroids if possible but if she needs it or worsening over the weekend she should call back to speak with on call MD. Thanks

## 2023-02-08 NOTE — Telephone Encounter (Signed)
Returned call to patient. Pt states that for the last couple of days patient feels like she is having a flare. For the last couple of days pt has been bloated, she feels like her intestines are inflamed. She had one episode of lower left side abdominal cramping yesterday, which has resolved. Pt states that she is typically constipated, she noticed BRB twice yesterday. Pt reports that she continues to take Miralax 2 tablespoons in the morning and 2 tablespoons in the evening, she does not feel like she has completely evacuated. Pt reports that she passed a white stool today. No diarrhea at all. Pt continues to take Rinvoq 30 mg, she does need a refill ordered under your name.

## 2023-02-08 NOTE — Telephone Encounter (Signed)
PT is having a UC flair and has seen some bleeding. She's very bloated and doing a miralax cleanse. She also is trying to get a RX for Rinvoq. It was originally scribed with pcp. [facility] shouldve faxed over information for that medication. Requesting call back

## 2023-02-11 ENCOUNTER — Other Ambulatory Visit (HOSPITAL_BASED_OUTPATIENT_CLINIC_OR_DEPARTMENT_OTHER): Payer: Self-pay | Admitting: Obstetrics & Gynecology

## 2023-02-11 ENCOUNTER — Other Ambulatory Visit: Payer: Self-pay | Admitting: Physical Medicine and Rehabilitation

## 2023-02-11 ENCOUNTER — Telehealth (HOSPITAL_BASED_OUTPATIENT_CLINIC_OR_DEPARTMENT_OTHER): Payer: Self-pay | Admitting: Obstetrics & Gynecology

## 2023-02-11 DIAGNOSIS — M5416 Radiculopathy, lumbar region: Secondary | ICD-10-CM

## 2023-02-11 DIAGNOSIS — B009 Herpesviral infection, unspecified: Secondary | ICD-10-CM

## 2023-02-11 DIAGNOSIS — M461 Sacroiliitis, not elsewhere classified: Secondary | ICD-10-CM

## 2023-02-11 DIAGNOSIS — M7918 Myalgia, other site: Secondary | ICD-10-CM

## 2023-02-11 MED ORDER — RINVOQ 30 MG PO TB24: 1.0000 | ORAL_TABLET | Freq: Every day | ORAL | 3 refills | Status: DC

## 2023-02-11 MED ORDER — VALACYCLOVIR HCL 500 MG PO TABS
ORAL_TABLET | ORAL | 0 refills | Status: AC
Start: 2023-02-11 — End: ?

## 2023-02-11 NOTE — Telephone Encounter (Signed)
Pt called on-call provider with HSV outbreak symptoms.  Called pharmacy for refill and was advised rx had been cancelled.  Using topical lidocaine without improvement.  Feeling like she has a low grade temp and overall just not great.  Would like recommendations about what to do.  Advised can send in rx for pt until has next appt with Dr. Estanislado Pandy.  Allergies reviewed.  Rx for valtrex 500mg  bid x 3 days with symptoms sent to pharmacy on file.  Confirmed [facility] [address] is the one she uses.  Routed to Dr. Estanislado Pandy to scheduled follow up if needed.Marland Kitchen

## 2023-02-11 NOTE — Telephone Encounter (Signed)
Called and spoke with patient regarding recommendations as outlined below. Pt will continue Miralax, but has declined Bentyl prescription. Patient does not wish to take more medications until she finds the source of her symptoms. Pt has requested that Rinvoq 30 mg prescription be sent to [facility] pharmacy.  Pt did not have worsening symptoms over the weekend, symptoms are about the same. Pt will keep colonoscopy as scheduled for further evaluation. Pt verbalized understanding and had no concerns at the end of the call.

## 2023-02-11 NOTE — Telephone Encounter (Signed)
Spoke with patient and she stated the last injection lasted about 6 weeks before the pain returned. Informed her that Dr. Alvester Morin can do a repeat injection or a repeat MRI. She would like an open MRI if it is ordered. Please advise

## 2023-02-13 ENCOUNTER — Other Ambulatory Visit: Payer: Self-pay | Admitting: Physical Medicine and Rehabilitation

## 2023-02-13 DIAGNOSIS — M461 Sacroiliitis, not elsewhere classified: Secondary | ICD-10-CM

## 2023-02-13 DIAGNOSIS — M5416 Radiculopathy, lumbar region: Secondary | ICD-10-CM

## 2023-02-13 DIAGNOSIS — M7918 Myalgia, other site: Secondary | ICD-10-CM

## 2023-02-13 NOTE — Telephone Encounter (Signed)
Noted in Referral.

## 2023-02-19 DIAGNOSIS — Z1231 Encounter for screening mammogram for malignant neoplasm of breast: Secondary | ICD-10-CM | POA: Diagnosis not present

## 2023-02-19 DIAGNOSIS — M810 Age-related osteoporosis without current pathological fracture: Secondary | ICD-10-CM | POA: Diagnosis not present

## 2023-02-19 DIAGNOSIS — Z124 Encounter for screening for malignant neoplasm of cervix: Secondary | ICD-10-CM | POA: Diagnosis not present

## 2023-02-19 DIAGNOSIS — Z01419 Encounter for gynecological examination (general) (routine) without abnormal findings: Secondary | ICD-10-CM | POA: Diagnosis not present

## 2023-02-19 LAB — HM MAMMOGRAPHY

## 2023-02-21 DIAGNOSIS — R070 Pain in throat: Secondary | ICD-10-CM | POA: Diagnosis not present

## 2023-02-21 DIAGNOSIS — H6981 Other specified disorders of Eustachian tube, right ear: Secondary | ICD-10-CM | POA: Diagnosis not present

## 2023-02-21 DIAGNOSIS — M2669 Other specified disorders of temporomandibular joint: Secondary | ICD-10-CM | POA: Diagnosis not present

## 2023-02-21 DIAGNOSIS — H9201 Otalgia, right ear: Secondary | ICD-10-CM | POA: Diagnosis not present

## 2023-02-21 DIAGNOSIS — H6991 Unspecified Eustachian tube disorder, right ear: Secondary | ICD-10-CM | POA: Diagnosis not present

## 2023-02-22 DIAGNOSIS — N6342 Unspecified lump in left breast, subareolar: Secondary | ICD-10-CM | POA: Diagnosis not present

## 2023-02-22 DIAGNOSIS — Z803 Family history of malignant neoplasm of breast: Secondary | ICD-10-CM | POA: Diagnosis not present

## 2023-02-25 LAB — HM PAP SMEAR: HPV, high-risk: NEGATIVE

## 2023-02-26 ENCOUNTER — Encounter: Payer: Self-pay | Admitting: Gastroenterology

## 2023-02-28 ENCOUNTER — Telehealth: Payer: Self-pay

## 2023-02-28 NOTE — Telephone Encounter (Signed)
Called patient to answer questions regarding colonoscopy addressed all questions and concerns.

## 2023-02-28 NOTE — Telephone Encounter (Signed)
Inbound call from patient with questions in regards to prep. Please advise.   Thank you

## 2023-03-04 ENCOUNTER — Ambulatory Visit (INDEPENDENT_AMBULATORY_CARE_PROVIDER_SITE_OTHER): Payer: BC Managed Care – PPO | Admitting: Podiatry

## 2023-03-04 DIAGNOSIS — B351 Tinea unguium: Secondary | ICD-10-CM | POA: Diagnosis not present

## 2023-03-04 MED ORDER — CLOTRIMAZOLE-BETAMETHASONE 1-0.05 % EX CREA: 1.0000 | TOPICAL_CREAM | Freq: Every day | CUTANEOUS | 2 refills | Status: DC

## 2023-03-04 NOTE — Progress Notes (Signed)
No chief complaint on file.   Subjective: 62 y.o. female presenting today as a new patient for evaluation of discoloration with thickening to the toenails of the right foot.  Patient states this has been ongoing for about 2 years.  She does have a history of liver pathology and autoimmune hepatitis so she is not a candidate for oral antifungal medication.  She presents for further treatment evaluation  Past Medical History:  Diagnosis Date   Asthma    Chronic kidney disease    Fatty liver 2023   CT   Hx of pancreatitis 06/16/2018   Kidney stone    Kidney stone    Migraine    Orthostatic hypotension    Osteopenia 03/31/2012   Recurrent upper respiratory infection (URI)    Ulcerative colitis (HCC) 03/31/2012   Urticaria     Past Surgical History:  Procedure Laterality Date   BIOPSY  01/25/2022   Procedure: BIOPSY;  Surgeon: Kerin Salen, MD;  Location: [facility];  Service: Gastroenterology;;   CESAREAN SECTION     x3   CHOLECYSTECTOMY     COLONOSCOPY     COLONOSCOPY N/A 01/25/2022   Procedure: COLONOSCOPY;  Surgeon: Kerin Salen, MD;  Location: [facility];  Service: Gastroenterology;  Laterality: N/A;   LITHOTRIPSY     THYROIDECTOMY     1997    Allergies  Allergen Reactions   Almond Oil Hives and Shortness Of Breath   Lialda [Mesalamine] Other (See Comments)    Vomiting and rectal bleeding   Other Anaphylaxis    Almonds, tree nuts   Aspirin Hives and Nausea Only    Stomach cramps   Hydromorphone Hcl Other (See Comments)    Caused Enzyme levels to go up   Penicillins Hives and Swelling    Has patient had a PCN reaction causing immediate rash, facial/tongue/throat swelling, SOB or lightheadedness with hypotension: Yes Has patient had a PCN reaction causing severe rash involving mucus membranes or skin necrosis: No Has patient had a PCN reaction that required hospitalization: No Has patient had a PCN reaction occurring within the last 10 years: No  Stomach  cramps If all of the above answers are "NO", then may proceed with Cephalosporin use.    Acetaminophen Other (See Comments)    Has autoimmune hepatitis, wants to avoid APAP   Apriso [Mesalamine Er] Nausea And Vomiting   Corticosteroids Other (See Comments)    Had acute pancreatitis x 2 after steroid use.   Imitrex [Sumatriptan] Swelling    Injection caused swelling   Stadol [Butorphanol] Other (See Comments)    hallucinates   Topamax [Topiramate] Other (See Comments)    Kidney stones   Vancomycin Hives   Azathioprine Rash   Pepcid [Famotidine] Nausea And Vomiting    Makes acid worse   Prednisone Nausea And Vomiting    All steroids causes nausea and acute pancreatitis     Objective: Physical Exam General: The patient is alert and oriented x3 in no acute distress.  Dermatology: Hyperkeratotic, discolored, thickened, onychodystrophy noted to the right foot. Skin is warm, dry and supple bilateral lower extremities.  There is some inflammatory dermatitis also noted to the weightbearing surfaces of the lateral aspect of the right foot concerning for possible tinea pedis  Vascular: Palpable pedal pulses bilaterally. No edema or erythema noted. Capillary refill within normal limits.  Neurological: Epicritic and protective threshold grossly intact bilaterally.   Musculoskeletal Exam: No pedal deformity noted  Assessment: #1 Onychomycosis of toenails #2 tinea pedis  right  Plan of Care:  -Patient evaluated -Prescription for Lotrisone cream apply daily -Continue topical Tolcylen antifungal -Continue laser antifungal treatment modalities monthly -Today we discussed the possibility of oral Lamisil which is the most effective modality for treatment of onychomycosis of the toenails.  Her last hepatic function panels have been WNL.  She has an appointment with a liver specialist in October 2024.  If blood work at that time is normal okay for oral Lamisil 250 mg #90 daily.  We will  reevaluate hepatic function panel halfway through taking the oral Lamisil.  She will contact our office -Return to clinic for next scheduled laser treatment.  As needed with me  *Aesthetician  Felecia Shelling, DPM [facility]  Dr. Felecia Shelling, DPM    [address] [place], [place] [address]                Office [phone]  Fax [phone]

## 2023-03-06 ENCOUNTER — Ambulatory Visit (AMBULATORY_SURGERY_CENTER): Payer: BC Managed Care – PPO | Admitting: Gastroenterology

## 2023-03-06 ENCOUNTER — Telehealth: Payer: Self-pay | Admitting: Genetic Counselor

## 2023-03-06 ENCOUNTER — Encounter: Payer: Self-pay | Admitting: Gastroenterology

## 2023-03-06 VITALS — BP 105/57 | HR 76 | Temp 98.9°F | Resp 15 | Ht 60.0 in | Wt 157.0 lb

## 2023-03-06 DIAGNOSIS — K51019 Ulcerative (chronic) pancolitis with unspecified complications: Secondary | ICD-10-CM

## 2023-03-06 DIAGNOSIS — K5150 Left sided colitis without complications: Secondary | ICD-10-CM | POA: Diagnosis not present

## 2023-03-06 DIAGNOSIS — K529 Noninfective gastroenteritis and colitis, unspecified: Secondary | ICD-10-CM | POA: Diagnosis not present

## 2023-03-06 DIAGNOSIS — K5140 Inflammatory polyps of colon without complications: Secondary | ICD-10-CM | POA: Diagnosis not present

## 2023-03-06 MED ORDER — SODIUM CHLORIDE 0.9 % IV SOLN: 500.0000 mL | Freq: Once | INTRAVENOUS | Status: DC

## 2023-03-06 NOTE — Telephone Encounter (Signed)
Left patient a message regarding scheduled appointment times/dates for [facility]

## 2023-03-06 NOTE — Op Note (Signed)
[facility] Patient Name: Danielle Bond Procedure Date: 03/06/2023 1:26 PM MRN: [id number] Endoscopist: Viviann Spare P. Adela Lank , MD, [id number] Age: 62 Referring MD:  Date of Birth: [birth year] Gender: Female Account #: [id number] Procedure:                Colonoscopy Indications:              Disease activity assessment of chronic ulcerative                            pancolitis - failed mesalamine and Humira,                            intolerant of thiopurines, hospitalized last year                            with flare, now on Rinvoq October 2023, with                            significant improvement Medicines:                Monitored Anesthesia Care Procedure:                Pre-Anesthesia Assessment:                           - Prior to the procedure, a History and Physical                            was performed, and patient medications and                            allergies were reviewed. The patient's tolerance of                            previous anesthesia was also reviewed. The risks                            and benefits of the procedure and the sedation                            options and risks were discussed with the patient.                            All questions were answered, and informed consent                            was obtained. Prior Anticoagulants: The patient has                            taken no anticoagulant or antiplatelet agents. ASA                            Grade Assessment: II - A patient with mild systemic  disease. After reviewing the risks and benefits,                            the patient was deemed in satisfactory condition to                            undergo the procedure.                           After obtaining informed consent, the colonoscope                            was passed under direct vision. Throughout the                            procedure, the patient's blood pressure,  pulse, and                            oxygen saturations were monitored continuously. The                            Olympus Scope [id number] was introduced through the                            anus and advanced to the the terminal ileum, with                            identification of the appendiceal orifice and IC                            valve. The colonoscopy was performed without                            difficulty. The patient tolerated the procedure                            well. The quality of the bowel preparation was                            good. The terminal ileum, ileocecal valve,                            appendiceal orifice, and rectum were photographed. Scope In: 1:30:49 PM Scope Out: 1:44:40 PM Scope Withdrawal Time: 0 hours 11 minutes 26 seconds  Total Procedure Duration: 0 hours 13 minutes 51 seconds  Findings:                 The perianal and digital rectal examinations were                            normal.                           The terminal ileum appeared normal.  A localized area of mildly erythematous mucosa was                            found at the hepatic flexure.                           Anal papilla(e) were hypertrophied.                           There were a few benign pseudoinflammatory polyps                            in the proximal left colon and distal transverse                            colon. The exam was otherwise without abnormality.                            No significant inflammation noted.                           Biopsies were taken with a cold forceps for                            histology from the right, transverse, left colon,                            and rectum. Complications:            No immediate complications. Estimated blood loss:                            Minimal. Estimated Blood Loss:     Estimated blood loss was minimal. Impression:               - The examined portion of the  ileum was normal.                           - Erythematous mucosa at the hepatic flexure                            consistent with very mild / faint erythema focally                            noted, no other significant inflammation.                           - Anal papilla(e) were hypertrophied.                           - Mild pseudopolyposis.                           - The examination was otherwise normal.                           -  Biopsies were taken with a cold forceps for                            histology.                           Overall, excellent response to Rinvoq, no                            significant inflammation. Recommendation:           - Patient has a contact number available for                            emergencies. The signs and symptoms of potential                            delayed complications were discussed with the                            patient. Return to normal activities tomorrow.                            Written discharge instructions were provided to the                            patient.                           - Resume previous diet.                           - Continue present medications.                           - Await pathology results. Viviann Spare P. , MD 03/06/2023 1:53:23 PM This report has been signed electronically.

## 2023-03-06 NOTE — Progress Notes (Signed)
Called to room to assist during endoscopic procedure.  Patient ID and intended procedure confirmed with present staff. Received instructions for my participation in the procedure from the performing physician.  

## 2023-03-06 NOTE — Progress Notes (Signed)
[name] History and Physical   Primary Care Physician:  Ardith Dark, MD   Reason for Procedure:   Ulcerative pancolitis  Plan:    colonoscopy     HPI: Danielle Bond is a 62 y.o. female  here for colonoscopy surveillance, history of UC pancolitis. She has been on Rinvoq since 2013, fecal calprotectin has significantly improved and clinically feeling better. Last colonoscopy 01/2022 prior to therapy with Rinvoq and was flaring.     Otherwise feels well without any cardiopulmonary symptoms.   I have discussed risks / benefits of anesthesia and endoscopic procedure with Griffin Basil and they wish to proceed with the exams as outlined today.    Past Medical History:  Diagnosis Date   Asthma    Chronic kidney disease    Fatty liver 2023   CT   Hx of pancreatitis 06/16/2018   Kidney stone    Kidney stone    Migraine    Orthostatic hypotension    Osteopenia 03/31/2012   Recurrent upper respiratory infection (URI)    Ulcerative colitis (HCC) 03/31/2012   Urticaria     Past Surgical History:  Procedure Laterality Date   BIOPSY  01/25/2022   Procedure: BIOPSY;  Surgeon: Kerin Salen, MD;  Location: [facility];  Service: Gastroenterology;;   CESAREAN SECTION     x3   CHOLECYSTECTOMY     COLONOSCOPY     COLONOSCOPY N/A 01/25/2022   Procedure: COLONOSCOPY;  Surgeon: Kerin Salen, MD;  Location: [facility];  Service: Gastroenterology;  Laterality: N/A;   LITHOTRIPSY     THYROIDECTOMY     1997    Prior to Admission medications   Medication Sig Start Date End Date Taking? Authorizing Provider  budesonide-formoterol (SYMBICORT) 160-4.5 MCG/ACT inhaler Inhale 2 puffs into the lungs 2 (two) times daily.   Yes [provider]  cholecalciferol (VITAMIN D3) 25 MCG (1000 UNIT) tablet Take 2,000 Units by mouth daily.   Yes [provider]  metoprolol succinate (TOPROL-XL) 25 MG 24 hr tablet Take 1 tablet by mouth daily.   Yes [provider]  RINVOQ 30 MG TB24 Take 1 tablet (30 mg total) by mouth daily. 02/11/23  Yes , Willaim Rayas, MD  rosuvastatin (CRESTOR) 10 MG tablet TAKE 1 TABLET BY MOUTH EVERY DAY 08/09/22  Yes Patwardhan, [name], MD  SYNTHROID 100 MCG tablet TAKE 1 TABLET BY MOUTH EVERY DAY IN THE MORNING 12/20/22  Yes Ardith Dark, MD  albuterol (PROVENTIL) (2.5 MG/3ML) 0.083% nebulizer solution Take 3 mLs (2.5 mg total) by nebulization every 6 (six) hours as needed for wheezing or shortness of breath. 06/22/21   Ardith Dark, MD  albuterol (VENTOLIN HFA) 108 (90 Base) MCG/ACT inhaler TAKE 2 PUFFS BY MOUTH EVERY 6 HOURS AS NEEDED FOR WHEEZE OR SHORTNESS OF BREATH 07/17/22   Ardith Dark, MD  azithromycin [facility]) 250 MG tablet Take 2 tabs day 1, then 1 tab daily Patient not taking: Reported on 03/06/2023 01/17/23   Ardith Dark, MD  clotrimazole-betamethasone (LOTRISONE) cream Apply 1 Application topically daily. 03/04/23   Felecia Shelling, DPM  [facility] VITAMIN B12 1000 MCG tablet Take 1,000 mcg by mouth daily. 06/11/22   [provider]  EPINEPHrine 0.3 mg/0.3 mL IJ SOAJ injection Inject 0.3 mg into the muscle as needed for anaphylaxis. 11/19/22   Ardith Dark, MD  folic acid (FOLVITE) 1 MG tablet TAKE 1 TABLET BY MOUTH EVERY DAY FOR 30 DAYS Patient taking differently:  Take 1 mg by mouth daily. 09/18/21   Ardith Dark, MD  lidocaine (XYLOCAINE) 5 % ointment Apply 1 Application topically daily as needed.    [provider]  Magnesium 500 MG TABS Take 500 mg by mouth daily.    [provider]  Multiple Vitamin (MULTIVITAMIN WITH MINERALS) TABS tablet Take 1 tablet by mouth daily.    [provider]  NON FORMULARY [place] Beet    [provider]  Omega 3-6-9 Fatty Acids (OMEGA 3-6-9 PO) Take 1 tablet by mouth daily.    [provider]  polyethylene glycol (MIRALAX) 17 g packet Use once or twice daily 01/14/23   , Willaim Rayas, MD  valACYclovir  (VALTREX) 500 MG tablet 1 tab bid x 3 days for new symptoms 02/11/23   Jerene Bears, MD  vitamin C (ASCORBIC ACID) 500 MG tablet Take 500 mg by mouth daily.    [provider]    Current Outpatient Medications  Medication Sig Dispense Refill   budesonide-formoterol (SYMBICORT) 160-4.5 MCG/ACT inhaler Inhale 2 puffs into the lungs 2 (two) times daily.     cholecalciferol (VITAMIN D3) 25 MCG (1000 UNIT) tablet Take 2,000 Units by mouth daily.     metoprolol succinate (TOPROL-XL) 25 MG 24 hr tablet Take 1 tablet by mouth daily.     RINVOQ 30 MG TB24 Take 1 tablet (30 mg total) by mouth daily. 90 tablet 3   rosuvastatin (CRESTOR) 10 MG tablet TAKE 1 TABLET BY MOUTH EVERY DAY 90 tablet 3   SYNTHROID 100 MCG tablet TAKE 1 TABLET BY MOUTH EVERY DAY IN THE MORNING 30 tablet 5   albuterol (PROVENTIL) (2.5 MG/3ML) 0.083% nebulizer solution Take 3 mLs (2.5 mg total) by nebulization every 6 (six) hours as needed for wheezing or shortness of breath. 150 mL 1   albuterol (VENTOLIN HFA) 108 (90 Base) MCG/ACT inhaler TAKE 2 PUFFS BY MOUTH EVERY 6 HOURS AS NEEDED FOR WHEEZE OR SHORTNESS OF BREATH 25.5 each 1   azithromycin (ZITHROMAX) 250 MG tablet Take 2 tabs day 1, then 1 tab daily (Patient not taking: Reported on 03/06/2023) 6 each 0   clotrimazole-betamethasone (LOTRISONE) cream Apply 1 Application topically daily. 45 g 2   [facility] VITAMIN B12 1000 MCG tablet Take 1,000 mcg by mouth daily.     EPINEPHrine 0.3 mg/0.3 mL IJ SOAJ injection Inject 0.3 mg into the muscle as needed for anaphylaxis. 2 each 1   folic acid (FOLVITE) 1 MG tablet TAKE 1 TABLET BY MOUTH EVERY DAY FOR 30 DAYS (Patient taking differently: Take 1 mg by mouth daily.) 90 tablet 1   lidocaine (XYLOCAINE) 5 % ointment Apply 1 Application topically daily as needed.     Magnesium 500 MG TABS Take 500 mg by mouth daily.     Multiple Vitamin (MULTIVITAMIN WITH MINERALS) TABS tablet Take 1 tablet by mouth daily.     NON FORMULARY G[place] Beet      Omega 3-6-9 Fatty Acids (OMEGA 3-6-9 PO) Take 1 tablet by mouth daily.     polyethylene glycol (MIRALAX) 17 g packet Use once or twice daily 14 each 0   valACYclovir (VALTREX) 500 MG tablet 1 tab bid x 3 days for new symptoms 30 tablet 0   vitamin C (ASCORBIC ACID) 500 MG tablet Take 500 mg by mouth daily.     Current Facility-Administered Medications  Medication Dose Route Frequency Provider Last Rate Last Admin   0.9 %  sodium chloride infusion  500 mL  Intravenous Once Benancio Deeds, MD        Allergies as of 03/06/2023 - Review Complete 03/06/2023  Allergen Reaction Noted   Almond oil Hives and Shortness Of Breath 12/18/2013   Lialda [mesalamine] Other (See Comments) 08/21/2018   Other Anaphylaxis 03/31/2012   Aspirin Hives and Nausea Only 03/31/2012   Hydromorphone hcl Other (See Comments) 08/12/2014   Penicillins Hives and Swelling 03/31/2012   Acetaminophen Other (See Comments) 01/16/2014   Apriso [mesalamine er] Nausea And Vomiting 03/03/2019   Corticosteroids Other (See Comments) 12/18/2013   Imitrex [sumatriptan] Swelling 03/31/2012   Stadol [butorphanol] Other (See Comments) 11/06/2012   Topamax [topiramate] Other (See Comments) 12/23/2017   Vancomycin Hives 11/06/2012   Azathioprine Rash 04/15/2014   Pepcid [famotidine] Nausea And Vomiting 03/31/2012   Prednisone Nausea And Vomiting 03/31/2012    Family History  Problem Relation Age of Onset   Heart disease Mother    Dementia Mother    Thyroid disease Mother    Osteoarthritis Mother    Asthma Mother    Heart attack Father    Heart attack Brother    Heart disease Brother    Heart disease Paternal Grandmother    Cervical cancer Paternal Aunt    Breast cancer Cousin    Colon cancer Other    Stomach cancer Neg Hx    Rectal cancer Neg Hx    Esophageal cancer Neg Hx    Liver cancer Neg Hx    Pancreatic cancer Neg Hx     Social History   Socioeconomic History   Marital status: Married    Spouse  name: Not on file   Number of children: 2   Years of education: some college   Highest education level: Not on file  Occupational History   Occupation: Self-employed  Tobacco Use   Smoking status: Never   Smokeless tobacco: Never  Vaping Use   Vaping status: Never Used  Substance and Sexual Activity   Alcohol use: No   Drug use: No   Sexual activity: Yes  Other Topics Concern   Not on file  Social History Narrative   Lives at home with her husband.   Right-handed.   No caffeine use.   Social Determinants of Health   Financial Resource Strain: Not on file  Food Insecurity: Not on file  Transportation Needs: Not on file  Physical Activity: Not on file  Stress: Not on file  Social Connections: Unknown (12/03/2021)   Received from [facility], [facility]   Social Network    Social Network: Not on file  Intimate Partner Violence: Unknown (10/25/2021)   Received from [facility], [facility]   HITS    Physically Hurt: Not on file    Insult or Talk Down To: Not on file    Threaten Physical Harm: Not on file    Scream or Curse: Not on file    Review of Systems: All other review of systems negative except as mentioned in the HPI.  Physical Exam: Vital signs BP (!) 152/91   Pulse 93   Temp 98.9 F (37.2 C)   Ht 5' (1.524 m)   Wt 157 lb (71.2 kg)   LMP 03/03/2013   SpO2 100%   BMI 30.66 kg/m   General:   Alert,  Well-developed, pleasant and cooperative in NAD Lungs:  Clear throughout to auscultation.   Heart:  Regular rate and rhythm Abdomen:  Soft, nontender and nondistended.   Neuro/Psych:  Alert and cooperative. Normal  mood and affect. A and O x 3  Harlin Rain, MD [facility]

## 2023-03-06 NOTE — Progress Notes (Signed)
Pt's states no medical or surgical changes since previsit or office visit. 

## 2023-03-06 NOTE — Progress Notes (Signed)
Report given to PACU, vss 

## 2023-03-06 NOTE — Patient Instructions (Addendum)
Recommendation:           - Patient has a contact number available for                            emergencies. The signs and symptoms of potential                            delayed complications were discussed with the                            patient. Return to normal activities tomorrow.                            Written discharge instructions were provided to the                            patient.                           - Resume previous diet.                           - Continue present medications.                           - Await pathology results.  YOU HAD AN ENDOSCOPIC PROCEDURE TODAY AT THE [facility]:   Refer to the procedure report that was given to you for any specific questions about what was found during the examination.  If the procedure report does not answer your questions, please call your gastroenterologist to clarify.  If you requested that your care partner not be given the details of your procedure findings, then the procedure report has been included in a sealed envelope for you to review at your convenience later.  YOU SHOULD EXPECT: Some feelings of bloating in the abdomen. Passage of more gas than usual.  Walking can help get rid of the air that was put into your GI tract during the procedure and reduce the bloating. If you had a lower endoscopy (such as a colonoscopy or flexible sigmoidoscopy) you may notice spotting of blood in your stool or on the toilet paper. If you underwent a bowel prep for your procedure, you may not have a normal bowel movement for a few days.  Please Note:  You might notice some irritation and congestion in your nose or some drainage.  This is from the oxygen used during your procedure.  There is no need for concern and it should clear up in a day or so.  SYMPTOMS TO REPORT IMMEDIATELY:  Following lower endoscopy (colonoscopy or flexible sigmoidoscopy):  Excessive amounts of blood in the stool  Significant tenderness or  worsening of abdominal pains  Swelling of the abdomen that is new, acute  Fever of 100F or higher  For urgent or emergent issues, a gastroenterologist can be reached at any hour by calling [phone]. Do not use MyChart messaging for urgent concerns.    DIET:  We do recommend a small meal at first, but then you may proceed to your regular diet.  Drink plenty of fluids but you should avoid alcoholic beverages for 24 hours.  ACTIVITY:  You should plan to take it easy for the rest of today and you should NOT DRIVE or use heavy machinery until tomorrow (because of the sedation medicines used during the test).    FOLLOW UP: Our staff will call the number listed on your records the next business day following your procedure.  We will call around 7:15- 8:00 am to check on you and address any questions or concerns that you may have regarding the information given to you following your procedure. If we do not reach you, we will leave a message.     If any biopsies were taken you will be contacted by phone or by letter within the next 1-3 weeks.  Please call us at [phone] if you have not heard about the biopsies in 3 weeks.    SIGNATURES/CONFIDENTIALITY: You and/or your care partner have signed paperwork which will be entered into your electronic medical record.  These signatures attest to the fact that that the information above on your After Visit Summary has been reviewed and is understood.  Full responsibility of the confidentiality of this discharge information lies with you and/or your care-partner.

## 2023-03-07 ENCOUNTER — Telehealth: Payer: Self-pay | Admitting: *Deleted

## 2023-03-07 NOTE — Telephone Encounter (Signed)
  Follow up Call-     03/06/2023    1:09 PM  Call back number  Post procedure Call Back phone  # [phone]  Permission to leave phone message Yes     Patient questions:   Message left to call us if necessary.

## 2023-03-13 DIAGNOSIS — M4727 Other spondylosis with radiculopathy, lumbosacral region: Secondary | ICD-10-CM | POA: Diagnosis not present

## 2023-03-13 DIAGNOSIS — M4726 Other spondylosis with radiculopathy, lumbar region: Secondary | ICD-10-CM | POA: Diagnosis not present

## 2023-03-20 ENCOUNTER — Telehealth: Payer: Self-pay

## 2023-03-20 NOTE — Telephone Encounter (Signed)
Spoke with patient and scheduled OV for 03/27/23.

## 2023-03-20 NOTE — Telephone Encounter (Signed)
-----   Message from [place], [place] E sent at 03/18/2023  1:08 PM EDT ----- Can we get her scheduled for lumbar MRI review? Thanks ----- Message ----- From: Tyrell Antonio, MD Sent: 03/18/2023   1:01 PM EDT To: Juanda Chance, NP  You ordered OPEN at [facility], looks pretty normalish ----- Message ----- From: Dorcas Mcmurray Sent: 03/18/2023  11:34 AM EDT To: Tyrell Antonio, MD

## 2023-03-22 ENCOUNTER — Other Ambulatory Visit: Payer: BC Managed Care – PPO

## 2023-03-26 ENCOUNTER — Telehealth: Payer: Self-pay | Admitting: Family Medicine

## 2023-03-26 ENCOUNTER — Telehealth: Payer: Self-pay | Admitting: Genetic Counselor

## 2023-03-26 NOTE — Telephone Encounter (Signed)
Patient states she saw cardiologist on 08/17/22 and was recommended to have cholesterol re-checked within 3 months by PCP since he is managing this. Patient did have visit with pcp last on 01/17/23 where patient was recommended to have this re-checked in 6 months (around 07/19/23). Patient also had blood work following 5/28.24 OV but lipid panel wasn't collected. Please advise if patient can have this lab done since still within 6 month period of ordering.

## 2023-03-26 NOTE — Telephone Encounter (Signed)
Rescheduled appointments per patients request. Patient is aware of the changes made to her upcoming appointments.

## 2023-03-26 NOTE — Telephone Encounter (Signed)
Ok with me. Please place any necessary orders. 

## 2023-03-26 NOTE — Telephone Encounter (Signed)
Please advise 

## 2023-03-27 ENCOUNTER — Ambulatory Visit (INDEPENDENT_AMBULATORY_CARE_PROVIDER_SITE_OTHER): Payer: BC Managed Care – PPO | Admitting: Physical Medicine and Rehabilitation

## 2023-03-27 ENCOUNTER — Encounter: Payer: Self-pay | Admitting: Physical Medicine and Rehabilitation

## 2023-03-27 VITALS — BP 128/90 | HR 83

## 2023-03-27 DIAGNOSIS — G8929 Other chronic pain: Secondary | ICD-10-CM | POA: Diagnosis not present

## 2023-03-27 DIAGNOSIS — M545 Low back pain, unspecified: Secondary | ICD-10-CM | POA: Diagnosis not present

## 2023-03-27 DIAGNOSIS — M7918 Myalgia, other site: Secondary | ICD-10-CM

## 2023-03-27 NOTE — Progress Notes (Signed)
Danielle Bond - 62 y.o. female MRN [id number]  Date of birth: Mar 12, 1961  Office Visit Note: Visit Date: 03/27/2023 PCP: Ardith Dark, MD Referred by: Ardith Dark, MD  Subjective: Chief Complaint  Patient presents with   Lower Back - Pain, Weakness   Right Leg - Pain, Weakness   HPI: Danielle Bond is a 62 y.o. female who comes in today for evaluation of chronic right lower back pain. She reports localized area of tenderness to right lower back near waist line. Patent reports pain has been ongoing for several months and is exacerbated by sitting. She describes her pain as sore and aching, currently rates as 8 out of 10. Lumbar MRI from 2022 exhibits mild spondylosis and facet joint arthritis, no herniated disc, spinal canal stenosis or foraminal stenosis noted.  Prior right sacroiliac joint injection performed in our office on 10/23/2021 and reports complete resolution of symptoms. We repeated right sacroiliac joint injection on 11/22/2022, she reports intermittent relief of pain for about 3 weeks. She does not feel this injection was significantly beneficial in alleviating her pain. Patient was previously treated by Dr. Casimer Lanius at [facility]. I did place referral for Dr. Sheliah Hatch back in April as she did voice request for new rheumatological provider. She was unable to see Dr. Dimple Casey due to chronic issues with ulcerative colitis.   Patient's course is complicated by ulcerative colitis, autoimmune hepatitis, arthralgia and osteoporosis.     Review of Systems  Musculoskeletal:  Positive for back pain, joint pain and myalgias.  Neurological:  Negative for tingling, sensory change, focal weakness and weakness.  All other systems reviewed and are negative.  Otherwise per HPI.  Assessment & Plan: Visit Diagnoses:    ICD-10-CM   1. Chronic right-sided low back pain without sciatica  M54.50 Ambulatory referral to Physical Therapy   G89.29     2.  Myofascial pain syndrome  M79.18 Ambulatory referral to Physical Therapy       Plan: Findings:  Chronic, worsening and severe right sided lower back pain. Painful and tender localized area to right lower back above waistline. Patient continues to have severe pain despite good conservative therapies such as home exercise regimen, rest and use of medications. Patients clinical presentation is consistent with myofascial pain syndrome. Her pain presents considerably different than prior issues, no pain to buttock/anterior thigh at this time. I also feel there could be a type of central sensitization syndrome such as fibromyalgia contributing to her symptoms. I had patient complete fibromyalgia screening questionnaire in the office today, both widespread pain index and symptom severity scores indicate she does meet diagnostic criteria for fibromyalgia. I discussed treatment plan with her in detail today. I would recommend regimen of formal physical therapy with a focus on manual treatments and dry needling. I also encouraged patient to remain active as tolerated. I do feel it would be beneficial for her to schedule appointment with Dr. Dimple Casey. I would like to see her back in 8 weeks for follow up. Could look at repeating right sacroiliac joint injection if we feel her pain is more SI joint related. No red flag symptoms noted at this time.     Meds & Orders: No orders of the defined types were placed in this encounter.   Orders Placed This Encounter  Procedures   Ambulatory referral to Physical Therapy    Follow-up: Return for 8 week follow up for re-evaluation.   Procedures: No procedures performed  Clinical History: INDICATION: Lumbar radiculopathy. Tingling sensation radiating down the RIGHT leg.   STUDY: MRI of the lumbar spine without intravenous contrast performed on 03/13/2023 10:37 AM.   COMPARISON: 11/08/2020.   TECHNIQUE: Multiplanar, multisequence MR imaging obtained through the lumbar  spine without contrast on 03/13/2023 10:37 AM.   CONTRAST: None.   FINDINGS:  # Osseous structures: Anterolateral osteophyte at T12-L1. Vertebral body heights are well-maintained. No acute fracture or concerning marrow signal abnormality appreciated.  #  Alignment:Mild levocurvature noted. No significant listhesis appreciated.  #  Conus medullaris/cauda equina: Spinal cord signal is normal and terminates at L1. The cauda equina is unremarkable.   #  Lower thoracic spine: Disc desiccation. No significant spinal canal or foraminal stenosis. This is viewed on the sagittal images only.   #  T12-L1: Disc desiccation with a small disc bulge and a superimposed LEFT paracentral disc protrusion. This effaces the ventral thecal sac. Neuroforamen are patent. No significant interval change.  #  L1-L2: Mild disc desiccation with a minimal disc bulge. No significant stenosis. This is viewed on the sagittal images only and is without significant interval change.  #  L2-L3: Mild disc desiccation with a small disc bulge. No significant stenosis. This is unchanged.  #  L3-L4: Mild disc desiccation with a small disc bulge. No significant stenosis. This is unchanged.  #  L4-L5: Mild disc desiccation with a small disc bulge and facet arthrosis. No significant stenosis. This is unchanged.  #  L5-S1: Mild disc desiccation with a posterior disc herniation that is more pronounced on the LEFT. Mild facet arthrosis. No significant stenosis. This is unchanged.   #  Paraspinal tissues: Unremarkable.   #  Additional comments: None.    IMPRESSION:  1. No acute process or significant interval change.  2.  Mild degenerative changes as described above. No significant nerve root encroachment.   Electronically Signed by: Ardyth Man MD, PHD on 03/18/2023 8:43 AM   She reports that she has never smoked. She has never used smokeless tobacco.  Recent Labs    06/28/22 1201 09/18/22 1108 12/18/22 1033  HGBA1C 5.9 6.4 6.1     Objective:  VS:  HT:    WT:   BMI:     BP:(!) 128/90  HR:83bpm  TEMP: ( )  RESP:  Physical Exam Vitals and nursing note reviewed.  HENT:     Head: Normocephalic and atraumatic.     Right Ear: External ear normal.     Left Ear: External ear normal.     Nose: Nose normal.     Mouth/Throat:     Mouth: Mucous membranes are moist.  Eyes:     Extraocular Movements: Extraocular movements intact.  Cardiovascular:     Rate and Rhythm: Normal rate.     Pulses: Normal pulses.  Pulmonary:     Effort: Pulmonary effort is normal.  Abdominal:     General: Abdomen is flat. There is no distension.  Musculoskeletal:        General: Tenderness present.     Cervical back: Normal range of motion.     Comments: Patient rises from seated position to standing without difficulty. Good lumbar range of motion. No pain noted with facet loading. 5/5 strength noted with bilateral hip flexion, knee flexion/extension, ankle dorsiflexion/plantarflexion and EHL. No clonus noted bilaterally. No pain upon palpation of greater trochanters. No pain with internal/external rotation of bilateral hips. Sensation intact bilaterally. Palpable tender trigger point noted to right lower back. Negative slump  test bilaterally. Ambulates without aid, gait steady.     Skin:    General: Skin is warm and dry.     Capillary Refill: Capillary refill takes less than 2 seconds.  Neurological:     General: No focal deficit present.     Mental Status: She is alert and oriented to person, place, and time.  Psychiatric:        Mood and Affect: Mood normal.        Behavior: Behavior normal.     Ortho Exam  Imaging: No results found.  Past Medical/Family/Surgical/Social History: Medications & Allergies reviewed per EMR, new medications updated. Patient Active Problem List   Diagnosis Date Noted   Prediabetes 09/20/2022   Chronic cough 08/07/2022   Abnormal CT of the chest 08/07/2022   Palpitations 03/29/2022    Precordial pain 03/29/2022   Chronic rhinitis 03/21/2022   Onychomycosis 01/30/2022   Arthralgia 07/19/2021   Immunosuppressed status (HCC) 07/19/2021   Eustachian tube dysfunction 06/13/2021   Osteoporosis 07/11/2020   Atherosclerosis of native coronary artery of native heart without angina pectoris 06/08/2019   Aortic atherosclerosis (HCC) 06/08/2019   Dyslipidemia 05/02/2019   Postural dizziness with presyncope 05/02/2019   HSV-2 (herpes simplex virus 2) infection 11/04/2018   Hypothyroidism s/p total thyroidectomy 1997 08/15/2018   Autoimmune hepatitis (HCC) 08/16/2012   Asthma 03/31/2012   Chronic ulcerative pancolitis (HCC) 03/31/2012   History of renal calculi 03/31/2012   Migraines 03/31/2012   Mitral valve prolapse    Past Medical History:  Diagnosis Date   Asthma    Chronic kidney disease    Fatty liver 2023   CT   Hx of pancreatitis 06/16/2018   Kidney stone    Kidney stone    Migraine    Orthostatic hypotension    Osteopenia 03/31/2012   Recurrent upper respiratory infection (URI)    Ulcerative colitis (HCC) 03/31/2012   Urticaria    Family History  Problem Relation Age of Onset   Heart disease Mother    Dementia Mother    Thyroid disease Mother    Osteoarthritis Mother    Asthma Mother    Heart attack Father    Heart attack Brother    Heart disease Brother    Heart disease Paternal Grandmother    Cervical cancer Paternal Aunt    Breast cancer Cousin    Colon cancer Other    Stomach cancer Neg Hx    Rectal cancer Neg Hx    Esophageal cancer Neg Hx    Liver cancer Neg Hx    Pancreatic cancer Neg Hx    Past Surgical History:  Procedure Laterality Date   BIOPSY  01/25/2022   Procedure: BIOPSY;  Surgeon: Kerin Salen, MD;  Location: [facility];  Service: Gastroenterology;;   CESAREAN SECTION     x3   CHOLECYSTECTOMY     COLONOSCOPY     COLONOSCOPY N/A 01/25/2022   Procedure: COLONOSCOPY;  Surgeon: Kerin Salen, MD;  Location: [facility];   Service: Gastroenterology;  Laterality: N/A;   LITHOTRIPSY     THYROIDECTOMY     1997   Social History   Occupational History   Occupation: Self-employed  Tobacco Use   Smoking status: Never   Smokeless tobacco: Never  Vaping Use   Vaping status: Never Used  Substance and Sexual Activity   Alcohol use: No   Drug use: No   Sexual activity: Yes

## 2023-03-27 NOTE — Progress Notes (Signed)
Functional Pain Scale - descriptive words and definitions  Distracting (5)    Aware of pain/able to complete some ADL's but limited by pain/sleep is affected and active distractions are only slightly useful. Moderate range order  Average Pain 5  Follow up after MRI. Pain is having pain in lower back around 'SI joint" and into right leg. Only uses BioFreeze for pain. Was in a lot of pain last night and unable to get comfortable.

## 2023-03-27 NOTE — Telephone Encounter (Signed)
Please schedule a lab appt for Lipid panel

## 2023-03-28 ENCOUNTER — Telehealth: Payer: Self-pay

## 2023-03-28 DIAGNOSIS — M545 Low back pain, unspecified: Secondary | ICD-10-CM

## 2023-03-28 NOTE — Telephone Encounter (Signed)
Unable to reach pt lvm requesting call back to schedule this

## 2023-03-28 NOTE — Telephone Encounter (Signed)
-----   Message from [place], [place] E sent at 03/27/2023 11:10 AM EDT ----- Can you please schedule 8 week follow up for her? Thanks

## 2023-04-02 ENCOUNTER — Encounter: Payer: BC Managed Care – PPO | Admitting: Genetic Counselor

## 2023-04-02 ENCOUNTER — Other Ambulatory Visit: Payer: BC Managed Care – PPO

## 2023-04-04 DIAGNOSIS — M5136 Other intervertebral disc degeneration, lumbar region: Secondary | ICD-10-CM | POA: Diagnosis not present

## 2023-04-04 DIAGNOSIS — M9905 Segmental and somatic dysfunction of pelvic region: Secondary | ICD-10-CM | POA: Diagnosis not present

## 2023-04-04 DIAGNOSIS — M9904 Segmental and somatic dysfunction of sacral region: Secondary | ICD-10-CM | POA: Diagnosis not present

## 2023-04-04 DIAGNOSIS — M9903 Segmental and somatic dysfunction of lumbar region: Secondary | ICD-10-CM | POA: Diagnosis not present

## 2023-04-08 ENCOUNTER — Other Ambulatory Visit: Payer: BC Managed Care – PPO

## 2023-04-10 ENCOUNTER — Telehealth: Payer: Self-pay | Admitting: Genetic Counselor

## 2023-04-10 NOTE — Telephone Encounter (Signed)
Rescheduled appointments per incoming call. Patient is aware of the changes made to her upcoming appointment.

## 2023-04-12 ENCOUNTER — Other Ambulatory Visit: Payer: BC Managed Care – PPO

## 2023-04-15 ENCOUNTER — Encounter: Payer: BC Managed Care – PPO | Admitting: Genetic Counselor

## 2023-04-15 ENCOUNTER — Other Ambulatory Visit: Payer: BC Managed Care – PPO

## 2023-04-18 DIAGNOSIS — K754 Autoimmune hepatitis: Secondary | ICD-10-CM | POA: Diagnosis not present

## 2023-04-22 ENCOUNTER — Other Ambulatory Visit (INDEPENDENT_AMBULATORY_CARE_PROVIDER_SITE_OTHER): Payer: BC Managed Care – PPO

## 2023-04-22 DIAGNOSIS — E785 Hyperlipidemia, unspecified: Secondary | ICD-10-CM

## 2023-04-22 LAB — LIPID PANEL
Cholesterol: 210 mg/dL — ABNORMAL HIGH (ref 0–200)
HDL: 82.3 mg/dL (ref >39.00)
LDL Cholesterol: 106 mg/dL — ABNORMAL HIGH (ref 0–99)
NonHDL: 127.77
Total CHOL/HDL Ratio: 3
Triglycerides: 110 mg/dL (ref 0.0–149.0)
VLDL: 22 mg/dL (ref 0.0–40.0)

## 2023-04-23 NOTE — Progress Notes (Signed)
Her cholesterol improved since last time we checked.  She can continue her current medications and we can recheck in 6 to 12 months.

## 2023-04-29 ENCOUNTER — Encounter: Payer: Self-pay | Admitting: Internal Medicine

## 2023-04-29 ENCOUNTER — Ambulatory Visit: Payer: BC Managed Care – PPO | Admitting: Internal Medicine

## 2023-04-29 VITALS — BP 122/76 | HR 84 | Temp 98.5°F | Ht 60.0 in | Wt 156.4 lb

## 2023-04-29 DIAGNOSIS — R053 Chronic cough: Secondary | ICD-10-CM | POA: Diagnosis not present

## 2023-04-29 DIAGNOSIS — J3081 Allergic rhinitis due to animal (cat) (dog) hair and dander: Secondary | ICD-10-CM

## 2023-04-29 DIAGNOSIS — J4540 Moderate persistent asthma, uncomplicated: Secondary | ICD-10-CM

## 2023-04-29 DIAGNOSIS — D721 Eosinophilia, unspecified: Secondary | ICD-10-CM

## 2023-04-29 DIAGNOSIS — R768 Other specified abnormal immunological findings in serum: Secondary | ICD-10-CM

## 2023-04-29 DIAGNOSIS — Z23 Encounter for immunization: Secondary | ICD-10-CM | POA: Diagnosis not present

## 2023-04-29 NOTE — Addendum Note (Signed)
Addended by: Christen Butter on: 04/29/2023 11:44 AM   Modules accepted: Orders

## 2023-04-29 NOTE — Progress Notes (Signed)
of 04/16/2019 15:48  Ref. Range 08/15/2012 19:14 10/05/2013 14:36 03/16/2014 16:42 06/26/2018 14:29 07/02/2018 10:42  Anti Nuclear Antibody (ANA) Latest Ref Range: Negative   POS (A)   Negative  ANA Pattern 1 Unknown  HOMOGENOUS (A)     ANA Titer 1 Latest Ref Range: <1:40    1:1280 (H)     ds DNA Ab Latest Units: IU/mL   1    GBM Ab Latest Units: AI    <1.0 <1.0  Mitochondrial M2 Ab, IgG Latest Ref Range: <0.91  1.14 (H)      Myeloperoxidase Abs Latest Units: AI     <1.0  Serine Protease 3 Latest Units: AI     <1.0  S cerevisiae IgG ab Latest Ref Range: <=20.0 Units 12.7      S cerevisiae IgA ab Latest Ref Range: <=20.0 Units 18.3       ROS  October 07, 2019:.  Visit with nurse practitioner Tammy Parrott  62 year old female never smoker followed for asthma, eosinophilia (possibly secondary to Lialda )  Medical history significant for Graves' disease, ulcerative colitis (now on Humira) and pancreatitis Today's video visit is for 4 week follow up for asthma and chronic cough .  Last visit patient was having increased cough.  She was changed from [facility] to Symbicort.  She says she is feeling better.  Feels that she can take in a clear breath and also cough has decreased.  She is practicing vocal cord relaxation techniques.  Feels that this is also helping.  She is using saline nasal rinses and this also seems to decrease her nasal congestion and postnasal drip.  She says overall she is feeling better.    OV 12/17/2019  Subjective:  Patient ID: Danielle Bond, female , DOB: 09/01/1960 , age 84 y.o. , MRN: [id number] , ADDRESS: [address] [place] [address]   12/17/2019 -   Chief Complaint  Patient presents with   Follow-up    Cough     HPI Danielle Bond 62  y.o. -presents for evaluation of chronic cough and follow-up.  After last saw her nurse practitioner saw her and it is documented above.  This was in March 2021.  Her symptoms had improved with cough neuropathy advised.  Patient was reluctant to undergo gabapentin.  She now returns for follow-up.  She tells me the cough is deteriorated with the change in weather from spring to summer and the humidity.  She feels she has asthma.  This is despite taking Symbicort.  She demonstrated a cough for me and it has a laryngeal quality.  She admits that throat is very dry and she feels it to be itchy.  There is also a tickle in the throat.  Cough does not bother her at night when she sleeping.  Otherwise no new problems.  Overall she feels her health is improved and her new immunosuppression is working well.   OV 05/24/2020  Subjective:  Patient ID: Danielle Bond, female , DOB: 1961/06/27 , age 21 y.o. , MRN: [id number] , ADDRESS: [address] [place] [address] PCP Ardith Dark, MD Patient Care Team: Ardith Dark, MD as PCP - General (Family Medicine) Casimer Lanius, MD as Consulting Physician (Rheumatology) Chilton Greathouse, MD as Consulting Physician (Pulmonary Disease) Kalman Shan, MD as Consulting Physician (Pulmonary Disease) Dorisann Frames, MD as Referring Physician (Endocrinology) Aquilla Hacker, PA-C as Physician Assistant (Physician Assistant) Silverio Lay, MD as Consulting Physician (Obstetrics and Gynecology)  This Provider for this  Jo 1 antibody, chromatin antibody, SSA, SSB, anti-scleroderma antibody, Smith antibody, double-stranded DNA antibody and RNP antibody and ESR all normal.  TSH is normal.  Hemoglobin is 12 g%.  Your sniffles continue to be high at 13.6% of her total white count of 7400.  Creatinine is normal.  Urine analysis appears normal.  Results for Danielle, Bond (MRN [id number]) as of 06/18/2018 09:18  Ref. Range 11/27/2013 12:40 03/10/2014 18:52 03/16/2014 16:42 05/23/2018 05:31 06/16/2018 15:10   Eosinophils Absolute Latest Ref Range: 15 - 500 cells/uL 0.2 0.4 0.2 0.6 (H) 385   Results for Danielle, Bond (MRN [id number]) as of 06/18/2018 09:18  Ref. Range 10/05/2013 14:36 03/16/2014 16:42  Anti Nuclear Antibody(ANA) Latest Ref Range: NEGATIVE  POS (A)   ANA Pattern 1 Unknown HOMOGENOUS (A)   ANA Titer 1 Latest Ref Range: <1:40   1:1280 (H)   ds DNA Ab Latest Units: IU/mL  1  C3 Complement Latest Ref Range: 90 - 180 mg/dL  086  C4 Complement Latest Ref Range: 10 - 40 mg/dL  18   Most recent CT scan of the chest angiogram May 23, 2018: Personally visualized.  Lung fields look fairly benign.  OV 07/29/2018  Subjective:  Patient ID: Danielle Bond, female , DOB: 1960/07/27 , age 80 y.o. , MRN: [id number] , ADDRESS: [address] [place] [address]   07/29/2018 -   Chief Complaint  Patient presents with   Follow-up    Pt states she is doing better since last visit. States she still has an occ dry cough but nothing like last visit. Pt states she does have some SOB/chest tightness today due to the weather.     HPI Danielle Bond 62 y.o. -presents with her husband for chronic cough and fever of unknown origin in the setting of ulcerative colitis and Lialda treatment  After her last visit we reviewed autoimmune panel from primary rheumatologist and this was negative.  We performed vasculitis panel and this was negative.  In the interim she is seen infectious disease specialist Dr. Orvan Falconer and apparently no infection present for her fever of unknown origin.  Discussions were held with Dr. Loreta Ave.  She also discussed with a gastroenterologist at [facility] and the thought process was that maybe the fevers are indeed coming from ulcerative colitis although this is very rare.  This is because autoimmune, vasculitis and infectious work-up was negative.  On July 01, 2018 patient's Lialda was stopped and she has been prescribed budesonide oral treatment.  Subsequently after  this within a few weeks her cough is significantly improved.  She not having any more night sweats other than one at night.  This is 80% better.  And she is also feeling a lot better.  Apparently starting Entyvio biologic for ulcerative colitis is in discussion but she is somewhat got trepidation about this given the fact it is an infusion.  She will meet with Dr. Loreta Ave about this.  Overall at this point she is feeling well no respiratory complaints other than the mild cough.  She is asking for referral to primary care physician.  Her current primary care physician is apparently out of the office for an extended period of time not otherwise specified.   OV 04/09/2019  Subjective:  Patient ID: Danielle Bond, female , DOB: 09/25/1960 , age 51 y.o. , MRN: [id number] , ADDRESS: [address] [place] [address]   04/09/2019 -  chronic cough and fever of unknown origin in the setting of  of 04/16/2019 15:48  Ref. Range 08/15/2012 19:14 10/05/2013 14:36 03/16/2014 16:42 06/26/2018 14:29 07/02/2018 10:42  Anti Nuclear Antibody (ANA) Latest Ref Range: Negative   POS (A)   Negative  ANA Pattern 1 Unknown  HOMOGENOUS (A)     ANA Titer 1 Latest Ref Range: <1:40    1:1280 (H)     ds DNA Ab Latest Units: IU/mL   1    GBM Ab Latest Units: AI    <1.0 <1.0  Mitochondrial M2 Ab, IgG Latest Ref Range: <0.91  1.14 (H)      Myeloperoxidase Abs Latest Units: AI     <1.0  Serine Protease 3 Latest Units: AI     <1.0  S cerevisiae IgG ab Latest Ref Range: <=20.0 Units 12.7      S cerevisiae IgA ab Latest Ref Range: <=20.0 Units 18.3       ROS  October 07, 2019:.  Visit with nurse practitioner Tammy Parrott  62 year old female never smoker followed for asthma, eosinophilia (possibly secondary to Lialda )  Medical history significant for Graves' disease, ulcerative colitis (now on Humira) and pancreatitis Today's video visit is for 4 week follow up for asthma and chronic cough .  Last visit patient was having increased cough.  She was changed from [facility] to Symbicort.  She says she is feeling better.  Feels that she can take in a clear breath and also cough has decreased.  She is practicing vocal cord relaxation techniques.  Feels that this is also helping.  She is using saline nasal rinses and this also seems to decrease her nasal congestion and postnasal drip.  She says overall she is feeling better.    OV 12/17/2019  Subjective:  Patient ID: Danielle Bond, female , DOB: 09/01/1960 , age 84 y.o. , MRN: [id number] , ADDRESS: [address] [place] [address]   12/17/2019 -   Chief Complaint  Patient presents with   Follow-up    Cough     HPI Danielle Bond 62  y.o. -presents for evaluation of chronic cough and follow-up.  After last saw her nurse practitioner saw her and it is documented above.  This was in March 2021.  Her symptoms had improved with cough neuropathy advised.  Patient was reluctant to undergo gabapentin.  She now returns for follow-up.  She tells me the cough is deteriorated with the change in weather from spring to summer and the humidity.  She feels she has asthma.  This is despite taking Symbicort.  She demonstrated a cough for me and it has a laryngeal quality.  She admits that throat is very dry and she feels it to be itchy.  There is also a tickle in the throat.  Cough does not bother her at night when she sleeping.  Otherwise no new problems.  Overall she feels her health is improved and her new immunosuppression is working well.   OV 05/24/2020  Subjective:  Patient ID: Danielle Bond, female , DOB: 1961/06/27 , age 21 y.o. , MRN: [id number] , ADDRESS: [address] [place] [address] PCP Ardith Dark, MD Patient Care Team: Ardith Dark, MD as PCP - General (Family Medicine) Casimer Lanius, MD as Consulting Physician (Rheumatology) Chilton Greathouse, MD as Consulting Physician (Pulmonary Disease) Kalman Shan, MD as Consulting Physician (Pulmonary Disease) Dorisann Frames, MD as Referring Physician (Endocrinology) Aquilla Hacker, PA-C as Physician Assistant (Physician Assistant) Silverio Lay, MD as Consulting Physician (Obstetrics and Gynecology)  This Provider for this  Jo 1 antibody, chromatin antibody, SSA, SSB, anti-scleroderma antibody, Smith antibody, double-stranded DNA antibody and RNP antibody and ESR all normal.  TSH is normal.  Hemoglobin is 12 g%.  Your sniffles continue to be high at 13.6% of her total white count of 7400.  Creatinine is normal.  Urine analysis appears normal.  Results for Danielle, Bond (MRN [id number]) as of 06/18/2018 09:18  Ref. Range 11/27/2013 12:40 03/10/2014 18:52 03/16/2014 16:42 05/23/2018 05:31 06/16/2018 15:10   Eosinophils Absolute Latest Ref Range: 15 - 500 cells/uL 0.2 0.4 0.2 0.6 (H) 385   Results for Danielle, Bond (MRN [id number]) as of 06/18/2018 09:18  Ref. Range 10/05/2013 14:36 03/16/2014 16:42  Anti Nuclear Antibody(ANA) Latest Ref Range: NEGATIVE  POS (A)   ANA Pattern 1 Unknown HOMOGENOUS (A)   ANA Titer 1 Latest Ref Range: <1:40   1:1280 (H)   ds DNA Ab Latest Units: IU/mL  1  C3 Complement Latest Ref Range: 90 - 180 mg/dL  086  C4 Complement Latest Ref Range: 10 - 40 mg/dL  18   Most recent CT scan of the chest angiogram May 23, 2018: Personally visualized.  Lung fields look fairly benign.  OV 07/29/2018  Subjective:  Patient ID: Danielle Bond, female , DOB: 1960/07/27 , age 80 y.o. , MRN: [id number] , ADDRESS: [address] [place] [address]   07/29/2018 -   Chief Complaint  Patient presents with   Follow-up    Pt states she is doing better since last visit. States she still has an occ dry cough but nothing like last visit. Pt states she does have some SOB/chest tightness today due to the weather.     HPI Danielle Bond 62 y.o. -presents with her husband for chronic cough and fever of unknown origin in the setting of ulcerative colitis and Lialda treatment  After her last visit we reviewed autoimmune panel from primary rheumatologist and this was negative.  We performed vasculitis panel and this was negative.  In the interim she is seen infectious disease specialist Dr. Orvan Falconer and apparently no infection present for her fever of unknown origin.  Discussions were held with Dr. Loreta Ave.  She also discussed with a gastroenterologist at [facility] and the thought process was that maybe the fevers are indeed coming from ulcerative colitis although this is very rare.  This is because autoimmune, vasculitis and infectious work-up was negative.  On July 01, 2018 patient's Lialda was stopped and she has been prescribed budesonide oral treatment.  Subsequently after  this within a few weeks her cough is significantly improved.  She not having any more night sweats other than one at night.  This is 80% better.  And she is also feeling a lot better.  Apparently starting Entyvio biologic for ulcerative colitis is in discussion but she is somewhat got trepidation about this given the fact it is an infusion.  She will meet with Dr. Loreta Ave about this.  Overall at this point she is feeling well no respiratory complaints other than the mild cough.  She is asking for referral to primary care physician.  Her current primary care physician is apparently out of the office for an extended period of time not otherwise specified.   OV 04/09/2019  Subjective:  Patient ID: Danielle Bond, female , DOB: 09/25/1960 , age 51 y.o. , MRN: [id number] , ADDRESS: [address] [place] [address]   04/09/2019 -  chronic cough and fever of unknown origin in the setting of  visit: Treatment Team:  Attending Provider: Kalman Shan, MD    05/24/2020 -   Chief Complaint  Patient presents with   Follow-up    Chronic cough, Asthma     HPI Danielle Bond 62 y.o. -presents for follow-up of her residual chronic cough.  Last seen in May 2021.  At that time was concerned about residual  coughing due to cough neuropathy.  Patient is reluctant to add more medications.  We subjected her to allergy work-up.  She  is here to review the results.  Her IgE is elevated.  She has a dog in the house and dog dander is significantly positive.  She is also allergic to few other things documented below.  She states that her cough happens during the spring and the fall consistent with the allergy season.  She feels that the allergies are driving this.  She is again somewhat reluctant to have more medications.  We discussed potential biologic therapies for asthma.  She is open to listening to this.  But she is also interested in like other options.  She is willing to see an allergist.  Reviewed her vaccination status.  She is up-to-date with her vaccines but she could benefit from a pneumonia vaccine especially because she is immunosuppressed and she tells me it is greater than 10 years since she took her pneumonia vaccine not otherwise specified.   ear-old female never smoker followed for moderate persistent asthma with allergic phenotype and chronic cough History of eosinophilia felt secondary to Lialda  History of Graves' disease, ulcerative colitis and pancreatitis      06/27/2021 Acute OV : Asthma     female never smoker followed for moderate persistent asthma with an allergic phenotype, and chronic cough.  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  Graves disease, ulcerative colitis on Humira and pancreatitis  Patient presents for an acute office visit.  Complains of cough x2 weeks. Initially had ear infection 2 weeks ago, treated by ENT with Zpack. Was exposed to possible flu cases with her granddaughter over the holiday. She developed body aches, chills, sweats , cough, congestion , joint pains. That has improved but continues to have intermittent cough and wheezing /tightness. Increased use of albuterol . Patient remains on Symbicort twice daily.  Flu shot utd, Covid vaccine x 4 .  No chest pain, orthopnea or edema. Appetite is good.  Can not take high dose steroids or shots, can take low dose  without issues.   OV 09/22/2021  Subjective:  Patient ID: Danielle Bond, female , DOB: 1960/09/04 , age 2 y.o. , MRN: [id number] , ADDRESS: [address] [place] [address] PCP Ardith Dark, MD Patient Care Team: Ardith Dark, MD as PCP - General (Family Medicine) Casimer Lanius, MD as Consulting Physician (Rheumatology) Chilton Greathouse, MD as Consulting Physician (Pulmonary Disease) Kalman Shan, MD as Consulting Physician (Pulmonary Disease) Dorisann Frames, MD as Referring Physician (Endocrinology) Aquilla Hacker, PA-C as Physician Assistant (Physician Assistant) Silverio Lay, MD as Consulting Physician (Obstetrics and Gynecology) Kerin Salen, MD as Consulting Physician (Gastroenterology)  This Provider for this visit: Treatment Team:  Attending Provider: Kalman Shan, MD    09/22/2021 -   Chief Complaint  Patient presents with   Follow-up    Pt states that her cough is better compared to last visit. Denies any complaints of SOB, chest discomfort, or wheezing.   Follow-up chronic cough with component of cough neuropathy and also allergic asthma on Symbicort Immunosuppressed because  of 04/16/2019 15:48  Ref. Range 08/15/2012 19:14 10/05/2013 14:36 03/16/2014 16:42 06/26/2018 14:29 07/02/2018 10:42  Anti Nuclear Antibody (ANA) Latest Ref Range: Negative   POS (A)   Negative  ANA Pattern 1 Unknown  HOMOGENOUS (A)     ANA Titer 1 Latest Ref Range: <1:40    1:1280 (H)     ds DNA Ab Latest Units: IU/mL   1    GBM Ab Latest Units: AI    <1.0 <1.0  Mitochondrial M2 Ab, IgG Latest Ref Range: <0.91  1.14 (H)      Myeloperoxidase Abs Latest Units: AI     <1.0  Serine Protease 3 Latest Units: AI     <1.0  S cerevisiae IgG ab Latest Ref Range: <=20.0 Units 12.7      S cerevisiae IgA ab Latest Ref Range: <=20.0 Units 18.3       ROS  October 07, 2019:.  Visit with nurse practitioner Tammy Parrott  62 year old female never smoker followed for asthma, eosinophilia (possibly secondary to Lialda )  Medical history significant for Graves' disease, ulcerative colitis (now on Humira) and pancreatitis Today's video visit is for 4 week follow up for asthma and chronic cough .  Last visit patient was having increased cough.  She was changed from [facility] to Symbicort.  She says she is feeling better.  Feels that she can take in a clear breath and also cough has decreased.  She is practicing vocal cord relaxation techniques.  Feels that this is also helping.  She is using saline nasal rinses and this also seems to decrease her nasal congestion and postnasal drip.  She says overall she is feeling better.    OV 12/17/2019  Subjective:  Patient ID: Danielle Bond, female , DOB: 09/01/1960 , age 84 y.o. , MRN: [id number] , ADDRESS: [address] [place] [address]   12/17/2019 -   Chief Complaint  Patient presents with   Follow-up    Cough     HPI Danielle Bond 62  y.o. -presents for evaluation of chronic cough and follow-up.  After last saw her nurse practitioner saw her and it is documented above.  This was in March 2021.  Her symptoms had improved with cough neuropathy advised.  Patient was reluctant to undergo gabapentin.  She now returns for follow-up.  She tells me the cough is deteriorated with the change in weather from spring to summer and the humidity.  She feels she has asthma.  This is despite taking Symbicort.  She demonstrated a cough for me and it has a laryngeal quality.  She admits that throat is very dry and she feels it to be itchy.  There is also a tickle in the throat.  Cough does not bother her at night when she sleeping.  Otherwise no new problems.  Overall she feels her health is improved and her new immunosuppression is working well.   OV 05/24/2020  Subjective:  Patient ID: Danielle Bond, female , DOB: 1961/06/27 , age 21 y.o. , MRN: [id number] , ADDRESS: [address] [place] [address] PCP Ardith Dark, MD Patient Care Team: Ardith Dark, MD as PCP - General (Family Medicine) Casimer Lanius, MD as Consulting Physician (Rheumatology) Chilton Greathouse, MD as Consulting Physician (Pulmonary Disease) Kalman Shan, MD as Consulting Physician (Pulmonary Disease) Dorisann Frames, MD as Referring Physician (Endocrinology) Aquilla Hacker, PA-C as Physician Assistant (Physician Assistant) Silverio Lay, MD as Consulting Physician (Obstetrics and Gynecology)  This Provider for this  visit: Treatment Team:  Attending Provider: Kalman Shan, MD    05/24/2020 -   Chief Complaint  Patient presents with   Follow-up    Chronic cough, Asthma     HPI Danielle Bond 62 y.o. -presents for follow-up of her residual chronic cough.  Last seen in May 2021.  At that time was concerned about residual  coughing due to cough neuropathy.  Patient is reluctant to add more medications.  We subjected her to allergy work-up.  She  is here to review the results.  Her IgE is elevated.  She has a dog in the house and dog dander is significantly positive.  She is also allergic to few other things documented below.  She states that her cough happens during the spring and the fall consistent with the allergy season.  She feels that the allergies are driving this.  She is again somewhat reluctant to have more medications.  We discussed potential biologic therapies for asthma.  She is open to listening to this.  But she is also interested in like other options.  She is willing to see an allergist.  Reviewed her vaccination status.  She is up-to-date with her vaccines but she could benefit from a pneumonia vaccine especially because she is immunosuppressed and she tells me it is greater than 10 years since she took her pneumonia vaccine not otherwise specified.   ear-old female never smoker followed for moderate persistent asthma with allergic phenotype and chronic cough History of eosinophilia felt secondary to Lialda  History of Graves' disease, ulcerative colitis and pancreatitis      06/27/2021 Acute OV : Asthma     female never smoker followed for moderate persistent asthma with an allergic phenotype, and chronic cough.  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  Graves disease, ulcerative colitis on Humira and pancreatitis  Patient presents for an acute office visit.  Complains of cough x2 weeks. Initially had ear infection 2 weeks ago, treated by ENT with Zpack. Was exposed to possible flu cases with her granddaughter over the holiday. She developed body aches, chills, sweats , cough, congestion , joint pains. That has improved but continues to have intermittent cough and wheezing /tightness. Increased use of albuterol . Patient remains on Symbicort twice daily.  Flu shot utd, Covid vaccine x 4 .  No chest pain, orthopnea or edema. Appetite is good.  Can not take high dose steroids or shots, can take low dose  without issues.   OV 09/22/2021  Subjective:  Patient ID: Danielle Bond, female , DOB: 1960/09/04 , age 2 y.o. , MRN: [id number] , ADDRESS: [address] [place] [address] PCP Ardith Dark, MD Patient Care Team: Ardith Dark, MD as PCP - General (Family Medicine) Casimer Lanius, MD as Consulting Physician (Rheumatology) Chilton Greathouse, MD as Consulting Physician (Pulmonary Disease) Kalman Shan, MD as Consulting Physician (Pulmonary Disease) Dorisann Frames, MD as Referring Physician (Endocrinology) Aquilla Hacker, PA-C as Physician Assistant (Physician Assistant) Silverio Lay, MD as Consulting Physician (Obstetrics and Gynecology) Kerin Salen, MD as Consulting Physician (Gastroenterology)  This Provider for this visit: Treatment Team:  Attending Provider: Kalman Shan, MD    09/22/2021 -   Chief Complaint  Patient presents with   Follow-up    Pt states that her cough is better compared to last visit. Denies any complaints of SOB, chest discomfort, or wheezing.   Follow-up chronic cough with component of cough neuropathy and also allergic asthma on Symbicort Immunosuppressed because  the above findings.   IMPRESSION: 1.  No acute PE or thoracic aortic dissection.     Electronically Signed   By: Corlis Leak M.D.   On: 08/23/2021 12:59       Results for Danielle, Bond (MRN [id number]) as of 05/24/2020 10:17  Ref. Range 12/17/2019 10:45  Sheep Sorrel IgE Latest Units: kU/L <0.10  Pecan/Hickory Tree IgE Latest Units: kU/L <0.10  IgE (Immunoglobulin E), Serum Latest Ref Range: <OR=114 kU/L 158 (H)  Allergen, D pternoyssinus,d7 Latest Units: kU/L <0.10  Cat Dander Latest Units: kU/L 2.89 (H)  Dog Dander Latest Units: kU/L 22.00 (H)  [place] Grass Latest Units: kU/L <0.10  [place] Grass Latest Units: kU/L <0.10  Timothy Grass Latest Units: kU/L 0.16 (H)  Cockroach Latest Units: kU/L <0.10  Aspergillus fumigatus, m3 Latest Units: kU/L <0.10  Allergen, Comm Silver Charletta Cousin, t9 Latest Units: kU/L 1.71 (H)  Allergen, Cottonwood, t14 Latest Units: kU/L <0.10  Elm IgE Latest Units: kU/L <0.10  Allergen, Mulberry, t76 Latest Units: kU/L <0.10  Allergen, Oak,t7 Latest Units: kU/L  0.88 (H)  COMMON RAGWEED (SHORT) (W1) IGE Latest Units: kU/L 0.87 (H)  Allergen, Mouse Urine Protein, e78 Latest Units: kU/L <0.10  D. farinae Latest Units: kU/L <0.10  Allergen, Cedar tree, t12 Latest Units: kU/L <0.10  Box Elder IgE Latest Units: kU/L <0.10  Rough Pigweed  IgE Latest Units: kU/L <0.10   Results for Danielle, Bond (MRN [id number]) as of 05/24/2020 10:17  Ref. Range 12/17/2019 10:45  IgE (Immunoglobulin E), Serum Latest Ref Range: <OR=114 kU/L 158 (H)   ROS - per HPI  08/07/2022 Follow up : Asthma and chronic Cough  Patient returns for 61-month follow-up.  Patient has underlying asthma with an allergic phenotype and chronic cough.  Overall says she is doing well with no flare of cough or wheezing.  No increased albuterol use.  She does remain on Symbicort twice daily.  She remains very active.  Goes to the gym and does yoga, pilates and zumba.  Has chronic allergies.  Remains on Claritin and Flonase as needed.  Continues to follow with GI for ulcerative colitis.  Remains on Rinvoq. Says overall is doing okay . Did have C Diff last year, recovered but took a long time.   Traveled to [place] last year, had a really great trip . Going to [employer] this year.   Flu shot , PVX and covid booster is up to date.     TEST/EVENTS :  PFTs February 02, 2020 shows mild restriction with FEV1 at 85%, ratio 89, FVC 74%, no significant bronchodilator response, DLCO at 109%.   01/2022 Eosinophils -600   CT chest August 23, 2021 negative for PE, bibasilar patchy groundglass  OV 04/29/2023  Subjective:  Patient ID: Danielle Bond, female , DOB: 1960-10-28 , age 3 y.o. , MRN: [id number] , ADDRESS: [address] [place] [address] PCP Ardith Dark, MD Patient Care Team: Ardith Dark, MD as PCP - General (Family Medicine) Casimer Lanius, MD as Consulting Physician (Rheumatology) Chilton Greathouse, MD as Consulting Physician (Pulmonary Disease) Kalman Shan, MD  as Consulting Physician (Pulmonary Disease) Dorisann Frames, MD as Referring Physician (Endocrinology) Aquilla Hacker, PA-C as Physician Assistant (Physician Assistant) Silverio Lay, MD as Consulting Physician (Obstetrics and Gynecology) Kerin Salen, MD as Consulting Physician (Gastroenterology) Glyn Ade, PA-C as Physician Assistant (Dermatology)  This Provider for this visit: Treatment Team:  Attending Provider: Kalman Shan, MD    04/29/2023 -   Chief  ulcerative colitis and Lialda treatment    HPI Danielle Bond 62 y.o. - cough was resolved. Now back 1.5 weeks ago. Dry cough. Hacky cough. Sometimes she does bring sputum. Annoying. Has on and off migraine headaches. No fever, No hemoptysis . No anosmia. No ageusia. No covid exposures but daughter had strep throat 2 weeks ago (confirmed strep with confirmed negative covid). No sore throat. Throat does feel itchy.  Does NOT want to do antibiotics. Doing low risk covid activities only  Re UC - doing Humira by Dr Marca Ancona since October 01, 2018 - q 2 weeks. .  Fevers went away in dec 2019. UC doing ok  OV 04/16/2019  Subjective:  Patient ID: Danielle Bond, female , DOB: 02-24-1961 , age 62 y.o. , MRN: [id number] , ADDRESS: [address] [place] [address]   04/16/2019 -   Chief Complaint  Patient presents with   Follow-up    Pt c/o prod cough with yellow mucus x 2 weeks. Pt also c/o left sided discomfort on lateral chest with inspiration. Pt states due to the discomfort she feels she cant take a full breath  in. Pt denies f/c/s and swelling.    Acute visit  HPI Danielle Bond 62 y.o. -is here for acute visit.  Originally seen in  Nov2019 for fever of unknown origin.  At that time ulcerative colitis was suspected along with Lialda because she had some eosinophilia.  She has been on Breo.  She tells me that her fever and cough broke and it was unrelated to stopping Lialda.  She then switched gastroenterologist and has been on care with Dr. Marca Ancona.  She is on Humira but she tells me the fever and cough stopped even before she went on Humira.  She had been doing well.  Approximately 3 weeks ago or so daughter developed strep throat [apparently COVID negative].  Then approximately 2 weeks ago she developed a cough.  We did do a telephone visit.  At that time she opted for expectant follow-up.  She is here acutely because the cough is persistent.  She also has some yellow sputum.  A few days ago she did feel like a muscle pull in the left infra-axillary area with the cough.  This is resolved.  Early this morning she had very mild symptoms of the same.  Therefore she is here but there is no wheezing or shortness of breath.  There is no pleuritic chest pain.  There is no hemoptysis no pedal edema.  She continues with the Brio.  We did do a COVID antigen testing/PCR testing and this was negative on 10 April 2019.  In May 2020 her COVID serology for IgG was negative.  She is interested in a chest x-ray and empiric treatment with antibiotics.  We are unable to do an exhaled nitric oxide test because of the national COVID emergency and local site restrictions.   COVID negative 04/10/2019 - negtaive Results for Danielle, Bond (MRN [id number]) as of 04/16/2019 15:48  Ref. Range 12/17/2018 09:18  SARS CoV2 AB IGG Unknown NEGATIVE    Results for Danielle, Bond (MRN [id number]) as of 04/16/2019 15:48  Ref. Range 06/18/2018 10:17 06/26/2018 01:15 06/26/2018 14:29  Eosinophils Absolute Latest Ref Range: 15 - 500 cells/uL 0.9  (H) 0.4 535 (H)  Results for Danielle, Bond (MRN [id number]) as of 04/16/2019 15:48  Ref. Range 06/18/2018 10:17  IgE (Immunoglobulin E), Serum Latest Ref Range: <OR=114 kU/L 74   Results for Danielle, Bond (MRN [id number]) as  of 04/16/2019 15:48  Ref. Range 08/15/2012 19:14 10/05/2013 14:36 03/16/2014 16:42 06/26/2018 14:29 07/02/2018 10:42  Anti Nuclear Antibody (ANA) Latest Ref Range: Negative   POS (A)   Negative  ANA Pattern 1 Unknown  HOMOGENOUS (A)     ANA Titer 1 Latest Ref Range: <1:40    1:1280 (H)     ds DNA Ab Latest Units: IU/mL   1    GBM Ab Latest Units: AI    <1.0 <1.0  Mitochondrial M2 Ab, IgG Latest Ref Range: <0.91  1.14 (H)      Myeloperoxidase Abs Latest Units: AI     <1.0  Serine Protease 3 Latest Units: AI     <1.0  S cerevisiae IgG ab Latest Ref Range: <=20.0 Units 12.7      S cerevisiae IgA ab Latest Ref Range: <=20.0 Units 18.3       ROS  October 07, 2019:.  Visit with nurse practitioner Tammy Parrott  62 year old female never smoker followed for asthma, eosinophilia (possibly secondary to Lialda )  Medical history significant for Graves' disease, ulcerative colitis (now on Humira) and pancreatitis Today's video visit is for 4 week follow up for asthma and chronic cough .  Last visit patient was having increased cough.  She was changed from [facility] to Symbicort.  She says she is feeling better.  Feels that she can take in a clear breath and also cough has decreased.  She is practicing vocal cord relaxation techniques.  Feels that this is also helping.  She is using saline nasal rinses and this also seems to decrease her nasal congestion and postnasal drip.  She says overall she is feeling better.    OV 12/17/2019  Subjective:  Patient ID: Danielle Bond, female , DOB: 09/01/1960 , age 84 y.o. , MRN: [id number] , ADDRESS: [address] [place] [address]   12/17/2019 -   Chief Complaint  Patient presents with   Follow-up    Cough     HPI Danielle Bond 62  y.o. -presents for evaluation of chronic cough and follow-up.  After last saw her nurse practitioner saw her and it is documented above.  This was in March 2021.  Her symptoms had improved with cough neuropathy advised.  Patient was reluctant to undergo gabapentin.  She now returns for follow-up.  She tells me the cough is deteriorated with the change in weather from spring to summer and the humidity.  She feels she has asthma.  This is despite taking Symbicort.  She demonstrated a cough for me and it has a laryngeal quality.  She admits that throat is very dry and she feels it to be itchy.  There is also a tickle in the throat.  Cough does not bother her at night when she sleeping.  Otherwise no new problems.  Overall she feels her health is improved and her new immunosuppression is working well.   OV 05/24/2020  Subjective:  Patient ID: Danielle Bond, female , DOB: 1961/06/27 , age 21 y.o. , MRN: [id number] , ADDRESS: [address] [place] [address] PCP Ardith Dark, MD Patient Care Team: Ardith Dark, MD as PCP - General (Family Medicine) Casimer Lanius, MD as Consulting Physician (Rheumatology) Chilton Greathouse, MD as Consulting Physician (Pulmonary Disease) Kalman Shan, MD as Consulting Physician (Pulmonary Disease) Dorisann Frames, MD as Referring Physician (Endocrinology) Aquilla Hacker, PA-C as Physician Assistant (Physician Assistant) Silverio Lay, MD as Consulting Physician (Obstetrics and Gynecology)  This Provider for this  ulcerative colitis and Lialda treatment    HPI Danielle Bond 62 y.o. - cough was resolved. Now back 1.5 weeks ago. Dry cough. Hacky cough. Sometimes she does bring sputum. Annoying. Has on and off migraine headaches. No fever, No hemoptysis . No anosmia. No ageusia. No covid exposures but daughter had strep throat 2 weeks ago (confirmed strep with confirmed negative covid). No sore throat. Throat does feel itchy.  Does NOT want to do antibiotics. Doing low risk covid activities only  Re UC - doing Humira by Dr Marca Ancona since October 01, 2018 - q 2 weeks. .  Fevers went away in dec 2019. UC doing ok  OV 04/16/2019  Subjective:  Patient ID: Danielle Bond, female , DOB: 02-24-1961 , age 62 y.o. , MRN: [id number] , ADDRESS: [address] [place] [address]   04/16/2019 -   Chief Complaint  Patient presents with   Follow-up    Pt c/o prod cough with yellow mucus x 2 weeks. Pt also c/o left sided discomfort on lateral chest with inspiration. Pt states due to the discomfort she feels she cant take a full breath  in. Pt denies f/c/s and swelling.    Acute visit  HPI Danielle Bond 62 y.o. -is here for acute visit.  Originally seen in  Nov2019 for fever of unknown origin.  At that time ulcerative colitis was suspected along with Lialda because she had some eosinophilia.  She has been on Breo.  She tells me that her fever and cough broke and it was unrelated to stopping Lialda.  She then switched gastroenterologist and has been on care with Dr. Marca Ancona.  She is on Humira but she tells me the fever and cough stopped even before she went on Humira.  She had been doing well.  Approximately 3 weeks ago or so daughter developed strep throat [apparently COVID negative].  Then approximately 2 weeks ago she developed a cough.  We did do a telephone visit.  At that time she opted for expectant follow-up.  She is here acutely because the cough is persistent.  She also has some yellow sputum.  A few days ago she did feel like a muscle pull in the left infra-axillary area with the cough.  This is resolved.  Early this morning she had very mild symptoms of the same.  Therefore she is here but there is no wheezing or shortness of breath.  There is no pleuritic chest pain.  There is no hemoptysis no pedal edema.  She continues with the Brio.  We did do a COVID antigen testing/PCR testing and this was negative on 10 April 2019.  In May 2020 her COVID serology for IgG was negative.  She is interested in a chest x-ray and empiric treatment with antibiotics.  We are unable to do an exhaled nitric oxide test because of the national COVID emergency and local site restrictions.   COVID negative 04/10/2019 - negtaive Results for Danielle, Bond (MRN [id number]) as of 04/16/2019 15:48  Ref. Range 12/17/2018 09:18  SARS CoV2 AB IGG Unknown NEGATIVE    Results for Danielle, Bond (MRN [id number]) as of 04/16/2019 15:48  Ref. Range 06/18/2018 10:17 06/26/2018 01:15 06/26/2018 14:29  Eosinophils Absolute Latest Ref Range: 15 - 500 cells/uL 0.9  (H) 0.4 535 (H)  Results for Danielle, Bond (MRN [id number]) as of 04/16/2019 15:48  Ref. Range 06/18/2018 10:17  IgE (Immunoglobulin E), Serum Latest Ref Range: <OR=114 kU/L 74   Results for Danielle, Bond (MRN [id number]) as

## 2023-04-29 NOTE — Patient Instructions (Addendum)
ICD-10-CM   1. Chronic cough  R05.3     2. Moderate persistent asthma without complication  J45.40     3. Allergy to dog dander  J30.81     4. Elevated IgE level  R76.8     5. Eosinophilia, unspecified type  D72.10        Cough asthma now under control and better esp after dog died summer 2023 CXR 2024/01/28normal  Plan -Cotninue symbicort as before - 1 puff twice daily + as needd -Albuerol as needed - no more dogs or get hypoallergenic dog - avoid getting cat as well - flu shot  04/29/2023 - RSV vaccine later commercially  Follow-up -9-12 months or sooner if needed; -  can do FeNO and ACT at followup

## 2023-05-01 ENCOUNTER — Encounter: Payer: Self-pay | Admitting: Gastroenterology

## 2023-05-01 ENCOUNTER — Ambulatory Visit (INDEPENDENT_AMBULATORY_CARE_PROVIDER_SITE_OTHER): Payer: BC Managed Care – PPO | Admitting: Gastroenterology

## 2023-05-01 VITALS — BP 136/78 | HR 90 | Ht 60.0 in | Wt 156.0 lb

## 2023-05-01 DIAGNOSIS — K51019 Ulcerative (chronic) pancolitis with unspecified complications: Secondary | ICD-10-CM | POA: Diagnosis not present

## 2023-05-01 DIAGNOSIS — Z79899 Other long term (current) drug therapy: Secondary | ICD-10-CM | POA: Diagnosis not present

## 2023-05-01 DIAGNOSIS — K754 Autoimmune hepatitis: Secondary | ICD-10-CM

## 2023-05-01 DIAGNOSIS — R140 Abdominal distension (gaseous): Secondary | ICD-10-CM

## 2023-05-01 NOTE — Patient Instructions (Signed)
If your blood pressure at your visit was 140/90 or greater, please contact your primary care physician to follow up on this.  _______________________________________________________  If you are age 62 or older, your body mass index should be between 23-30. Your Body mass index is 30.47 kg/m. If this is out of the aforementioned range listed, please consider follow up with your Primary Care Provider.  If you are age 50 or younger, your body mass index should be between 19-25. Your Body mass index is 30.47 kg/m. If this is out of the aformentioned range listed, please consider follow up with your Primary Care Provider.   ________________________________________________________  The [facility] providers would like to encourage you to use [facility] to communicate with providers for non-urgent requests or questions.  Due to long hold times on the telephone, sending your provider a message by [facility] may be a faster and more efficient way to get a response.  Please allow 48 business hours for a response.  Please remember that this is for non-urgent requests.  _______________________________________________________  Danielle Bond will be due for labs in December.  We will remind you when it is time to go to the lab.  Continue Rinvoq.  Continue Miralax and IBgard as needed.  We are giving you a Low-FODMAP diet handout today. FODMAPs are short-chain carbohydrates (sugars) that are highly fermentable, which means that they go through chemical changes in the GI system, and are poorly absorbed during digestion. When FODMAPs reach the colon (large intestine), bacteria ferment these sugars, turning them into gas and chemicals. This stretches the walls of the colon, causing abdominal bloating, distension, cramping, pain, and/or changes in bowel habits in many patients with IBS. FODMAPs are not unhealthy or harmful, but may exacerbate GI symptoms in those with sensitive GI tracts.  Please follow up in 6 months.  Thank  you for entrusting me with your care and for choosing [facility], Dr. Ileene Patrick

## 2023-05-01 NOTE — Progress Notes (Signed)
HPI :  Review of pertinent GI history  Ulcerative colitis history: Diagnosed in 2007.  Symptoms in the past have been cramping/twisting left lower quadrant pain, diarrhea, rectal bleeding.  She has been on Lialda and Apriso in the past which did not control her symptoms.  She was placed on Humira in March 2020 and did well on that regimen until July 2023 when she failed, admitted with a severe flare of her colitis-she had loss of weight, bleeding, diarrhea.  Treated with steroids, course complicated by C. difficile for which she had therapy for that recovered.  Of note it sounds like she had rash to Dificid at that time. Eventually transitioned to Rinvoq October 2023.  Of note it looks like she has an allergy reported to mesalamine, husband states it was not an allergy but she simply failed it.  She also has reported history of a rash to azathioprine in the past and hives to vancomycin.    Colonoscopy was in July 2023, as outlined below, when hospitalized, fair prep, pancolitis noted.  Fecal calprotectin was over 3000 at that time.  Her daughter has Crohn's disease.     History of autoimmune hepatitis.   Admitted to the hospital in April 2014 with an elevated ALT of 1200.  At that time she had a markedly elevated IgG, ANA, smooth muscle antibody, as well as an AMA.  She did not think she was taking any new medicines at the time other than Dilaudid given to her in the ED.  She had a liver biopsy at the time which was consistent with autoimmune hepatitis.  She was treated with steroids at that time but she states she is never had any follow-up for this particular issue.  Her liver enzymes have been relatively normal since then. CT scan in July of last year during time of her colitis flare which showed a normal-appearing liver.  She denies any family history of liver disease.  She has seen rheumatologist in the past for joint pains.      SINCE LAST VISIT:  Patient here for a follow-up visit today.   She has been doing really well since of last year.  She had a fecal calprotectin done since her office visit which was significantly improved, value 123.  She also had a colonoscopy with me in August.  There was no significant inflammation noted.  Biopsy showed perhaps some mild activity but nothing concerning.  She has been feeling really well since have last seen her.  She averages 3 bowel movements per day which are typically formed, no blood, no urgency.  She does have occasional constipation for which she takes MiraLAX as needed and this works really well for her when she does need it.  Her main complaint is ongoing bloating and some gas which she states can bother her if she has more stress in her life.  Her daughter does have Crohn's disease and is dealing with that currently.  She has tried some IBgard we gave her at the last visit which does help, she uses that as needed.  She also takes papaya enzymes as needed.  She is up-to-date with her vaccines in regards to her Rinvoq.  She just got a COVID booster as well as her flu shot.  She is up-to-date with Shingrix, and Pneumovax.  She is speaking with her pulmonologist about getting RSV vaccine.  Otherwise tolerates Rinvoq well, she is compliant with it.   In addition, I had her seen by [facility]  [place] at [facility] hepatology since her last visit.  She also had repeat labs showing a normal IgG, mildly positive smooth muscle antibody, positive ANA.  Her liver enzymes were normal.  Hep otology thinks her autoimmune hepatitis is in remission and recommends every 6 months LFTs at this point in time.   Prior workup: Colonoscopy 01/25/2022 - Preparation of the colon was fair. - The examined portion of the ileum was normal. - Congested, erythematous, friable (with contact bleeding), hemorrhagic, inflamed, ulcerated and vascular-pattern-decreased mucosa in the entire examined colon. Biopsied.   Fecal calprotectin 01/2022 - > 3000     Labs in May 12/18/22-  reviewed in Epic   ESR 30 CRP 0.520 A1c 6.4 Iron 88, ferritin 29, iron sat 24.3%, TIBC 362 B12 763 TSH 1.37 BUN 13, Cr 0.84 AP 54, AST 29, ALT 18, T prot 7.1, T bil 0.6, alb 4.2 WBC 4.9, Hgb 12.1, HCT 37.1, plt 248   Lipids 09/18/22: trig 96, HDL 96, LDL 142     Echo 11/12/21: EF 60-65%, grade I MR, mild TR , no pulm HTN    ANA (+) 1:320 SMA (+) 22 IgG 1456 AMA negative  Referred to hepatology  Fecal calprotectin 01/15/23 - 129   Colonoscopy 03/06/23: - The perianal and digital rectal examinations were normal. - The terminal ileum appeared normal. - A localized area of mildly erythematous mucosa was found at the hepatic flexure. - Anal papilla(e) were hypertrophied. - There were a few benign pseudoinflammatory polyps in the proximal left colon and distal transverse colon. The exam was otherwise without abnormality. No significant inflammation noted. - Biopsies were taken with a cold forceps for histology from the right, transverse, left colon, and rectum.  1. Surgical [P], left colon - CHRONIC ACTIVE COLITIS (POLYPOID FRAGMENTS) - NEGATIVE FOR GRANULOMATA, DYSPLASIA OR MALIGNANCY - SEE NOTE 2. Surgical [P], colon, transverse - MILD CHRONIC, MINIMALLY ACTIVE COLITIS - NEGATIVE FOR GRANULOMATA, DYSPLASIA OR MALIGNANCY - SEE NOTE 3. Surgical [P], right colon biopsies - COLONIC MUCOSA WITH MILD ARCHITECTURAL DISARRAY - NEGATIVE FOR SIGNIFICANT ACTIVE INFLAMMATION, DEFINITIVE CHRONIC CHANGES, GRANULOMATA, DYSPLASIA OR MALIGNANCY  1. - 2. The findings are consistent with the patient's clinical history of chronic ulcerative colitis.    Past Medical History:  Diagnosis Date   Asthma    Autoimmune hepatitis (HCC)    Chronic kidney disease    Fatty liver 2023   CT   Hx of pancreatitis 06/16/2018   Kidney stone    Kidney stone    Migraine    Orthostatic hypotension    Osteopenia 03/31/2012   Recurrent upper respiratory infection (URI)    Ulcerative colitis (HCC)  03/31/2012   Urticaria      Past Surgical History:  Procedure Laterality Date   BIOPSY  01/25/2022   Procedure: BIOPSY;  Surgeon: Kerin Salen, MD;  Location: [facility];  Service: Gastroenterology;;   CESAREAN SECTION     x3   CHOLECYSTECTOMY     COLONOSCOPY     COLONOSCOPY N/A 01/25/2022   Procedure: COLONOSCOPY;  Surgeon: Kerin Salen, MD;  Location: [facility];  Service: Gastroenterology;  Laterality: N/A;   LITHOTRIPSY     THYROIDECTOMY     1997   Family History  Problem Relation Age of Onset   Heart disease Mother    Dementia Mother    Thyroid disease Mother    Osteoarthritis Mother    Asthma Mother    Heart attack Father    Heart attack Brother    Heart  disease Brother    Heart disease Paternal Grandmother    Cervical cancer Paternal Aunt    Breast cancer Cousin    Colon cancer Other    Crohn's disease Daughter 29   Stomach cancer Neg Hx    Rectal cancer Neg Hx    Esophageal cancer Neg Hx    Liver cancer Neg Hx    Pancreatic cancer Neg Hx    Social History   Tobacco Use   Smoking status: Never   Smokeless tobacco: Never  Vaping Use   Vaping status: Never Used  Substance Use Topics   Alcohol use: No   Drug use: No   Current Outpatient Medications  Medication Sig Dispense Refill   albuterol (PROVENTIL) (2.5 MG/3ML) 0.083% nebulizer solution Take 3 mLs (2.5 mg total) by nebulization every 6 (six) hours as needed for wheezing or shortness of breath. 150 mL 1   albuterol (VENTOLIN HFA) 108 (90 Base) MCG/ACT inhaler TAKE 2 PUFFS BY MOUTH EVERY 6 HOURS AS NEEDED FOR WHEEZE OR SHORTNESS OF BREATH 25.5 each 1   budesonide-formoterol (SYMBICORT) 160-4.5 MCG/ACT inhaler Inhale 1 puff into the lungs 2 (two) times daily.     cholecalciferol (VITAMIN D3) 25 MCG (1000 UNIT) tablet Take 2,000 Units by mouth daily.     EPINEPHrine 0.3 mg/0.3 mL IJ SOAJ injection Inject 0.3 mg into the muscle as needed for anaphylaxis. 2 each 1   folic acid (FOLVITE) 1 MG tablet TAKE 1  TABLET BY MOUTH EVERY DAY FOR 30 DAYS (Patient taking differently: Take 1 mg by mouth daily.) 90 tablet 1   lidocaine (XYLOCAINE) 5 % ointment Apply 1 Application topically daily as needed.     Magnesium 500 MG TABS Take 500 mg by mouth daily.     metoprolol succinate (TOPROL-XL) 25 MG 24 hr tablet Take 1 tablet by mouth daily.     Multiple Vitamin (MULTIVITAMIN WITH MINERALS) TABS tablet Take 1 tablet by mouth daily.     NON FORMULARY [place] Beet     Omega 3-6-9 Fatty Acids (OMEGA 3-6-9 PO) Take 1 tablet by mouth daily.     RINVOQ 30 MG TB24 Take 1 tablet (30 mg total) by mouth daily. 90 tablet 3   rosuvastatin (CRESTOR) 10 MG tablet TAKE 1 TABLET BY MOUTH EVERY DAY 90 tablet 3   SYNTHROID 100 MCG tablet TAKE 1 TABLET BY MOUTH EVERY DAY IN THE MORNING 30 tablet 5   valACYclovir (VALTREX) 500 MG tablet 1 tab bid x 3 days for new symptoms 30 tablet 0   vitamin C (ASCORBIC ACID) 500 MG tablet Take 500 mg by mouth daily.     [employer] VITAMIN B12 1000 MCG tablet Take 1,000 mcg by mouth daily. (Patient not taking: Reported on 05/01/2023)     polyethylene glycol (MIRALAX) 17 g packet Use once or twice daily (Patient not taking: Reported on 05/01/2023) 14 each 0   No current facility-administered medications for this visit.   Allergies  Allergen Reactions   Almond Oil Hives and Shortness Of Breath   Lialda [Mesalamine] Other (See Comments)    Vomiting and rectal bleeding   Other Anaphylaxis    Almonds, tree nuts   Aspirin Hives and Nausea Only    Stomach cramps   Hydromorphone Hcl Other (See Comments)    Caused Enzyme levels to go up   Penicillins Hives and Swelling    Has patient had a PCN reaction causing immediate rash, facial/tongue/throat swelling, SOB or lightheadedness with hypotension: Yes Has patient  had a PCN reaction causing severe rash involving mucus membranes or skin necrosis: No Has patient had a PCN reaction that required hospitalization: No Has patient had a PCN reaction occurring  within the last 10 years: No  Stomach cramps If all of the above answers are "NO", then may proceed with Cephalosporin use.    Acetaminophen Other (See Comments)    Has autoimmune hepatitis, wants to avoid APAP   Apriso [Mesalamine Er] Nausea And Vomiting   Corticosteroids Other (See Comments)    Had acute pancreatitis x 2 after steroid use.   Imitrex [Sumatriptan] Swelling    Injection caused swelling   Stadol [Butorphanol] Other (See Comments)    hallucinates   Topamax [Topiramate] Other (See Comments)    Kidney stones   Vancomycin Hives   Azathioprine Rash   Pepcid [Famotidine] Nausea And Vomiting    Makes acid worse   Prednisone Nausea And Vomiting    All steroids causes nausea and acute pancreatitis      Review of Systems: All systems reviewed and negative except where noted in HPI.   Lab Results  Component Value Date   WBC 4.9 12/18/2022   HGB 12.1 12/18/2022   HCT 37.1 12/18/2022   MCV 91.9 12/18/2022   PLT 248.0 12/18/2022    Lab Results  Component Value Date   NA 139 12/18/2022   CL 103 12/18/2022   K 4.4 12/18/2022   CO2 27 12/18/2022   BUN 13 12/18/2022   CREATININE 0.84 12/18/2022   GFR 74.63 12/18/2022   CALCIUM 9.2 12/18/2022   ALBUMIN 4.5 01/14/2023   GLUCOSE 114 (H) 12/18/2022    Lab Results  Component Value Date   ALT 20 01/14/2023   AST 30 01/14/2023   ALKPHOS 55 01/14/2023   BILITOT 0.8 01/14/2023     Physical Exam: BP 136/78   Pulse 90   Ht 5' (1.524 m)   Wt 156 lb (70.8 kg)   LMP 03/03/2013   SpO2 97%   BMI 30.47 kg/m  Constitutional: Pleasant,well-developed, female in no acute distress. Neurological: Alert and oriented to person place and time. Psychiatric: Normal mood and affect. Behavior is normal.   ASSESSMENT: 62 y.o. female here for assessment of the following  1. Chronic ulcerative pancolitis with complication (HCC)   2. High risk medication use   3. Autoimmune hepatitis (HCC)   4. Bloating    Detailed  history as outlined above.  Flared on prior regimen, now on Rinvoq since October 2023 and doing clinically very well.  Fecal calprotectin significantly improved, follow-up colonoscopy without any significant inflammation.  She is clinically feeling really well.  We discussed her course to date.  Will plan on continuing Rinvoq 30 mg daily.  She is tolerating it well.  We reviewed risks of the regimen.  Her vaccinations are up-to-date and she is doing a good job keeping up-to-date with all of her vaccines, she will discuss RSV vaccine with her pulmonologist.  Plan on repeating her labs in December.  May consider another colonoscopy in 1 to 2 years given her duration of disease.  She understands she is at high risk for colon cancer with her history of colitis.  We reviewed her history of autoimmune hepatitis.  She is doing well and in remission with this.  Seen by hepatology.  Plan on repeating LFTs 6 months from her last visit.  Will plan on doing LFTs and IgG level in December.  We otherwise discussed her bloating symptoms.  MiraLAX does  work well to treat her constipation, if she is having constipation she should take that as needed.  She is continuing IBgard which also helps.  I counseled her on a low FODMAP diet and gave her some handouts about this to see if she is eating any trigger foods that could be related.  Also gave her some information about FODZYME supplement to see if that helps as well.    PLAN: - continue Rinvoq 30mg  / day - labs in December - CBC, CMET, IgG - vaccines UTD - continue Miralax PRN - continue IB gard PRN - dietary handout given on low FODMAP diet to see if this will help bloating - information given on FODZYME supplement if she wishes to try that - f/u 6 months or sooner with issues  Harlin Rain, MD [facility]

## 2023-05-06 ENCOUNTER — Inpatient Hospital Stay: Payer: BC Managed Care – PPO | Attending: Genetic Counselor | Admitting: Genetic Counselor

## 2023-05-06 ENCOUNTER — Other Ambulatory Visit: Payer: Self-pay | Admitting: Genetic Counselor

## 2023-05-06 ENCOUNTER — Inpatient Hospital Stay: Payer: BC Managed Care – PPO

## 2023-05-06 ENCOUNTER — Encounter: Payer: Self-pay | Admitting: Genetic Counselor

## 2023-05-06 DIAGNOSIS — Z8041 Family history of malignant neoplasm of ovary: Secondary | ICD-10-CM

## 2023-05-06 DIAGNOSIS — Z803 Family history of malignant neoplasm of breast: Secondary | ICD-10-CM

## 2023-05-06 DIAGNOSIS — Z800 Family history of malignant neoplasm of digestive organs: Secondary | ICD-10-CM | POA: Diagnosis not present

## 2023-05-06 DIAGNOSIS — Z8042 Family history of malignant neoplasm of prostate: Secondary | ICD-10-CM | POA: Diagnosis not present

## 2023-05-06 LAB — GENETIC SCREENING ORDER

## 2023-05-06 NOTE — Progress Notes (Signed)
REFERRING PROVIDER: Emelia Loron, MD [address] [place],  [place] [address]  PRIMARY PROVIDER:  Ardith Dark, MD  PRIMARY REASON FOR VISIT:  Encounter Diagnoses  Name Primary?   Family history of ovarian cancer Yes   Family history of prostate cancer    Family history of breast cancer    Family history of colon cancer     HISTORY OF PRESENT ILLNESS:   Danielle Bond, a 62 y.o. female, was seen for a Center cancer genetics consultation at the request of Dr. Dwain Sarna due to a family history of breast, ovarian, and other cancers.  Danielle Bond presents to clinic today to discuss the possibility of a hereditary predisposition to cancer, to discuss genetic testing, and to further clarify her future cancer risks, as well as potential cancer risks for family members.    Danielle Bond is a 62 y.o. female with no personal history of cancer.    CANCER HISTORY:  Oncology History   No history exists.    RISK FACTORS:  Mammogram within the last year: yes; May 2024; category B density  Number of breast biopsies:  no . Colonoscopy: yes;  most recent August 2024 ; [facility] colitis  Hysterectomy: no.  Ovaries intact: yes.  Menarche was at age 30.  First live birth at age 80.  Menopausal status: postmenopausal; LMP in 2012.  OCP use for less than 1 month.  HRT use: 0 years. Dermatology screening: as needed.   Past Medical History:  Diagnosis Date   Asthma    Autoimmune hepatitis (HCC)    Chronic kidney disease    Fatty liver 2023   CT   Hx of pancreatitis 06/16/2018   Kidney stone    Kidney stone    Migraine    Orthostatic hypotension    Osteopenia 03/31/2012   Recurrent upper respiratory infection (URI)    Ulcerative colitis (HCC) 03/31/2012   Urticaria     Past Surgical History:  Procedure Laterality Date   BIOPSY  01/25/2022   Procedure: BIOPSY;  Surgeon: Kerin Salen, MD;  Location: [facility];  Service: Gastroenterology;;   CESAREAN SECTION     x3    CHOLECYSTECTOMY     COLONOSCOPY     COLONOSCOPY N/A 01/25/2022   Procedure: COLONOSCOPY;  Surgeon: Kerin Salen, MD;  Location: [facility];  Service: Gastroenterology;  Laterality: N/A;   LITHOTRIPSY     THYROIDECTOMY     1997     FAMILY HISTORY:  We obtained a detailed, 4-generation family history.  Significant diagnoses are listed below: Family History  Problem Relation Age of Onset   Colon cancer Maternal Aunt        dx 67s   Ovarian cancer Paternal Aunt 48   Prostate cancer Paternal Uncle        metastatic; dx 31s   Crohn's disease Daughter 70   Breast cancer Cousin        paternal female cousin; dx 5s   Colon cancer Cousin 55       paternal female cousin     Danielle Bond is unaware of previous family history of genetic testing for hereditary cancer risks. Other relatives were unavailable for genetic testing at this time.   She reported a small amount of Ashkenazi Jewish ancestry (less than 1% per ancestry genetic testing). There is no known consanguinity.  GENETIC COUNSELING ASSESSMENT: Danielle Bond is a 62 y.o. female with a family history which is somewhat suggestive of a hereditary cancer syndrome  and predisposition to cancer given the presence of breast and ovarian cancer in the family at young ages. We, therefore, discussed and recommended the following at today's visit.   DISCUSSION: We discussed that 5 - 10% of cancer is hereditary, with most cases of hereditary breast and ovarian cancer are associated with mutations in BRCA1/2.  There are other genes that can be associated with hereditary breast and ovarian cancer syndromes.  We discussed that testing is beneficial for several reasons, including knowing about other cancer risks, identifying potential screening and risk-reduction options that may be appropriate, and to understanding if other family members could be at risk for cancer and allowing them to undergo genetic testing.  We reviewed the characteristics, features  and inheritance patterns of hereditary cancer syndromes. We also discussed genetic testing, including the appropriate family members to test, the process of testing, insurance coverage and turn-around-time for results. We discussed the implications of a negative, positive, and variant of uncertain significant result. We discussed that negative results would be uninformative given that Danielle Bond does not have a personal history of cancer. We recommended Danielle Bond pursue genetic testing for a panel that contains genes associated with breast, ovarian, prostate, colon, and other cancers.  Danielle Bond was offered a common hereditary cancer panel (~40 genes) and an expanded pan-cancer panel (~70 genes). Danielle Bond was informed of the benefits and limitations of each panel, including that expanded pan-cancer panels contain several genes that do not have clear management guidelines at this point in time.  We also discussed that as the number of genes included on a panel increases, the chances of variants of uncertain significance increases.  After considering the benefits and limitations of each gene panel, Danielle Bond elected to have a common hereditary cancers panel through [facility]  The CustomNext-Cancer+RNAinsight panel offered by [facility] includes sequencing, rearrangement, and RNA analysis for the following 47 genes:  APC, ATM, AXIN2, BAP1, BARD1, BMPR1A, BRCA1, BRCA2, BRIP1, CDH1, CDK4, CDKN2A, CHEK2, CTNNA1, DICER1, EPCAM, FH, GREM1, HOXB13, KIT, MEN1, MLH1, MSH2, MSH3, MSH6, MUTYH, NF1, NTHL1, PALB2, PDGFRA, PMS2, POLD1, POLE, PTEN, RAD51C, RAD51D, SDHA, SDHB, SDHC, SDHD, SMAD4, SMARCA4, STK11, TP53, TSC1, TSC2, VHL.     Based on Danielle Bond's family history of ovarian cancer in her paternal aunt and other related cancers (metastatic prostate, pre-menopoausal breast cancer) in her paternal family, she meets NCCN medical criteria for genetic testing.  Affected relatives or those more closely  related to affected relatives are unavailable for genetic testing at this time.  Positive results may direct impact the patient's medical management (high risk breast screening, consideration for RRSO, etc).  Despite that she meets criteria, she may still have an out of pocket cost. We discussed that if her out of pocket cost for testing is over $100, the laboratory should contact them to discuss self-pay options and/or patient pay assistance programs.   We discussed the Genetic Information Non-Discrimination Act (GINA) of 2008, which helps protect individuals against genetic discrimination based on their genetic test results.  It impacts both health insurance and employment.  With health insurance, it protects against genetic test results being used for increased premiums or policy termination. For employment, it protects against hiring, firing and promoting decisions based on genetic test results.  GINA does not apply to those in the [employer], those who work for companies with less than 15 employees, and new life insurance or long-term disability insurance policies.  Health status due to a cancer diagnosis is not protected  under GINA.  PLAN: After considering the risks, benefits, and limitations, Danielle Bond provided informed consent to pursue genetic testing and the blood sample was sent to [facility] for analysis of the CustomNext-Cancer +RNAinsight Panel. Results should be available within approximately 2-3 weeks' time, at which point they will be disclosed by telephone to Danielle Bond, as will any additional recommendations warranted by these results. Danielle Bond will receive a summary of her genetic counseling visit and a copy of her results once available. This information will also be available in Epic.   Based on Danielle Bond's family history, we recommended her father as well as her paternal cousin with a history of premenopausal breast cancer have genetic counseling and testing. Danielle Bond  will let us know if we can be of any assistance in coordinating genetic counseling and/or testing for these family members.   Danielle Bond questions were answered to her satisfaction today. Our contact information was provided should additional questions or concerns arise. Thank you for the referral and allowing [name] to share in the care of your patient.   Danielle Bond M. Rennie Plowman, MS, [facility] Genetic Counselor [email or web address] [phone]   The patient was seen for a total of 40 minutes in face-to-face genetic counseling.  The patient was seen alone.  Drs. Gunnar Bulla and/or Mosetta Putt were available to discuss this case as needed.  _______________________________________________________________________ For Office Staff:  Number of people involved in session: 1 Was an Intern/ student involved with case: no

## 2023-05-08 DIAGNOSIS — B351 Tinea unguium: Secondary | ICD-10-CM | POA: Diagnosis not present

## 2023-05-08 DIAGNOSIS — B353 Tinea pedis: Secondary | ICD-10-CM | POA: Diagnosis not present

## 2023-05-08 DIAGNOSIS — L821 Other seborrheic keratosis: Secondary | ICD-10-CM | POA: Diagnosis not present

## 2023-05-15 ENCOUNTER — Encounter: Payer: Self-pay | Admitting: Family

## 2023-05-15 ENCOUNTER — Ambulatory Visit: Payer: BC Managed Care – PPO | Admitting: Family

## 2023-05-15 VITALS — BP 143/83 | HR 71 | Temp 98.0°F | Ht <= 58 in | Wt 155.0 lb

## 2023-05-15 DIAGNOSIS — J0110 Acute frontal sinusitis, unspecified: Secondary | ICD-10-CM

## 2023-05-15 MED ORDER — AZITHROMYCIN 250 MG PO TABS: ORAL_TABLET | ORAL | 0 refills | Status: AC

## 2023-05-15 NOTE — Progress Notes (Signed)
Patient ID: Danielle Bond, female    DOB: May 06, 1961, 62 y.o.   MRN: [id number]  Chief Complaint  Patient presents with   Sinus Problem    Pt c/o Sinus headache/pressure and nasal dryness  Has tried nasal lavage and flonase which did not help. Sx for 2 weeks.    *Discussed the use of AI scribe software for clinical note transcription with the patient, who gave verbal consent to proceed.  History of Present Illness   The patient, with a history of asthma, colitis, autoimmune hepatitis, and seasonal allergies presents with sinus pain and pressure for the past two weeks. She reports that this is a recurring issue, typically occurring in the fall and spring. Despite using Flonase twice daily and nasal lavage, the symptoms have not improved. She also applies Vaseline in her nose as recommended by her ear doctor. The patient denies taking any oral allergy medications such as Claritin or Zyrtec. She also denies taking Singulair. The sinus pain and pressure is localized to the area between her eyes and extends to her right ear, down her right side of neck. She reports a history of ear infections, particularly on the right side.  The patient has noticed some sinus yellow mucus last week. She also reports a scratchy throat. She has not been able to sleep well due to the discomfort.     Assessment & Plan:     Sinusitis - Chronic sinusitis with pain and pressure for two weeks. Nasal congestion and postnasal drip present. Currently using Flonase BID and Neti-pot. No relief with current treatment. -Prescribe Z-Pak (Azithromycin) to treat sinusitis (allergy to PCN), pt aware of SE has taken in past. -Advise to continue Flonase BID and consider next time starting her sinus med routine at least 3 weeks prior to allergy season -Recommend using humidifier and hydrating with at least 2liters water daily. -RTO precautions provided.  General Health Maintenance - -Discussed RSV vaccine, decided to defer for a  few years as asthma is well-controlled and patient has no recent history of pneumonia and hx of PNA vaccines. -Confirmed receipt of flu shot on April 29, 2023 and COVID vaccine on April 01, 2023.      Subjective:    Outpatient Medications Prior to Visit  Medication Sig Dispense Refill   albuterol (PROVENTIL) (2.5 MG/3ML) 0.083% nebulizer solution Take 3 mLs (2.5 mg total) by nebulization every 6 (six) hours as needed for wheezing or shortness of breath. 150 mL 1   albuterol (VENTOLIN HFA) 108 (90 Base) MCG/ACT inhaler TAKE 2 PUFFS BY MOUTH EVERY 6 HOURS AS NEEDED FOR WHEEZE OR SHORTNESS OF BREATH 25.5 each 1   budesonide-formoterol (SYMBICORT) 160-4.5 MCG/ACT inhaler Inhale 1 puff into the lungs 2 (two) times daily.     cholecalciferol (VITAMIN D3) 25 MCG (1000 UNIT) tablet Take 2,000 Units by mouth daily.     [employer] VITAMIN B12 1000 MCG tablet Take 1,000 mcg by mouth daily.     EPINEPHrine 0.3 mg/0.3 mL IJ SOAJ injection Inject 0.3 mg into the muscle as needed for anaphylaxis. 2 each 1   folic acid (FOLVITE) 1 MG tablet TAKE 1 TABLET BY MOUTH EVERY DAY FOR 30 DAYS (Patient taking differently: Take 1 mg by mouth daily.) 90 tablet 1   lidocaine (XYLOCAINE) 5 % ointment Apply 1 Application topically daily as needed.     Magnesium 500 MG TABS Take 500 mg by mouth daily.     metoprolol succinate (TOPROL-XL) 25 MG 24 hr tablet Take  1 tablet by mouth daily.     Multiple Vitamin (MULTIVITAMIN WITH MINERALS) TABS tablet Take 1 tablet by mouth daily.     NON FORMULARY [place] Beet     Omega 3-6-9 Fatty Acids (OMEGA 3-6-9 PO) Take 1 tablet by mouth daily.     polyethylene glycol (MIRALAX) 17 g packet Use once or twice daily 14 each 0   RINVOQ 30 MG TB24 Take 1 tablet (30 mg total) by mouth daily. 90 tablet 3   rosuvastatin (CRESTOR) 10 MG tablet TAKE 1 TABLET BY MOUTH EVERY DAY 90 tablet 3   SYNTHROID 100 MCG tablet TAKE 1 TABLET BY MOUTH EVERY DAY IN THE MORNING 30 tablet 5   valACYclovir (VALTREX) 500  MG tablet 1 tab bid x 3 days for new symptoms 30 tablet 0   vitamin C (ASCORBIC ACID) 500 MG tablet Take 500 mg by mouth daily.     No facility-administered medications prior to visit.   Past Medical History:  Diagnosis Date   Asthma    Autoimmune hepatitis (HCC)    Chronic kidney disease    Fatty liver 2023   CT   Hx of pancreatitis 06/16/2018   Kidney stone    Kidney stone    Migraine    Orthostatic hypotension    Osteopenia 03/31/2012   Recurrent upper respiratory infection (URI)    Ulcerative colitis (HCC) 03/31/2012   Urticaria    Past Surgical History:  Procedure Laterality Date   BIOPSY  01/25/2022   Procedure: BIOPSY;  Surgeon: Kerin Salen, MD;  Location: [facility];  Service: Gastroenterology;;   CESAREAN SECTION     x3   CHOLECYSTECTOMY     COLONOSCOPY     COLONOSCOPY N/A 01/25/2022   Procedure: COLONOSCOPY;  Surgeon: Kerin Salen, MD;  Location: [facility];  Service: Gastroenterology;  Laterality: N/A;   LITHOTRIPSY     THYROIDECTOMY     1997   Allergies  Allergen Reactions   Almond Oil Hives and Shortness Of Breath   Lialda [Mesalamine] Other (See Comments)    Vomiting and rectal bleeding   Other Anaphylaxis    Almonds, tree nuts   Aspirin Hives and Nausea Only    Stomach cramps   Hydromorphone Hcl Other (See Comments)    Caused Enzyme levels to go up   Penicillins Hives and Swelling    Has patient had a PCN reaction causing immediate rash, facial/tongue/throat swelling, SOB or lightheadedness with hypotension: Yes Has patient had a PCN reaction causing severe rash involving mucus membranes or skin necrosis: No Has patient had a PCN reaction that required hospitalization: No Has patient had a PCN reaction occurring within the last 10 years: No  Stomach cramps If all of the above answers are "NO", then may proceed with Cephalosporin use.    Acetaminophen Other (See Comments)    Has autoimmune hepatitis, wants to avoid APAP   Apriso [Mesalamine Er]  Nausea And Vomiting   Corticosteroids Other (See Comments)    Had acute pancreatitis x 2 after steroid use.   Imitrex [Sumatriptan] Swelling    Injection caused swelling   Stadol [Butorphanol] Other (See Comments)    hallucinates   Topamax [Topiramate] Other (See Comments)    Kidney stones   Vancomycin Hives   Azathioprine Rash   Pepcid [Famotidine] Nausea And Vomiting    Makes acid worse   Prednisone Nausea And Vomiting    All steroids causes nausea and acute pancreatitis       Objective:  Physical Exam Vitals and nursing note reviewed.  Constitutional:      Appearance: Normal appearance. She is not ill-appearing.     Interventions: Face mask in place.  HENT:     Right Ear: Tympanic membrane and ear canal normal.     Left Ear: Tympanic membrane and ear canal normal.     Nose:     Right Sinus: Frontal sinus tenderness present.     Left Sinus: Frontal sinus tenderness present.     Mouth/Throat:     Mouth: Mucous membranes are moist.     Pharynx: Posterior oropharyngeal erythema present. No pharyngeal swelling, oropharyngeal exudate or uvula swelling.     Tonsils: No tonsillar exudate or tonsillar abscesses.  Cardiovascular:     Rate and Rhythm: Normal rate and regular rhythm.  Pulmonary:     Effort: Pulmonary effort is normal.     Breath sounds: Normal breath sounds.  Musculoskeletal:        General: Normal range of motion.  Lymphadenopathy:     Head:     Right side of head: No preauricular or posterior auricular adenopathy.     Left side of head: No preauricular or posterior auricular adenopathy.     Cervical: Cervical adenopathy present.     Right cervical: Superficial cervical adenopathy present.  Skin:    General: Skin is warm and dry.  Neurological:     Mental Status: She is alert.  Psychiatric:        Mood and Affect: Mood normal.        Behavior: Behavior normal.    BP (!) 143/83 (BP Location: Left Arm, Patient Position: Sitting, Cuff Size: Normal)    Pulse 71   Temp 98 F (36.7 C) (Temporal)   Ht 4' (1.219 m)   Wt 155 lb (70.3 kg)   LMP 03/03/2013   SpO2 97%   BMI 47.30 kg/m  Wt Readings from Last 3 Encounters:  05/15/23 155 lb (70.3 kg)  05/01/23 156 lb (70.8 kg)  04/29/23 156 lb 6.4 oz (70.9 kg)       Dulce Sellar, NP

## 2023-05-16 ENCOUNTER — Telehealth: Payer: Self-pay | Admitting: Podiatry

## 2023-05-16 NOTE — Telephone Encounter (Signed)
Pt called and he went to see the liver doctor and they are not recommending any antifungal medication. Right foot is peeling and nails not getting better and liver doctor told her to be careful due to infection. Went to derm and they gave her a cream that broke pt out in hives. She has appt 11/4 to see you.

## 2023-05-20 ENCOUNTER — Ambulatory Visit: Payer: BC Managed Care – PPO | Admitting: Dermatology

## 2023-05-20 DIAGNOSIS — S92515A Nondisplaced fracture of proximal phalanx of left lesser toe(s), initial encounter for closed fracture: Secondary | ICD-10-CM | POA: Diagnosis not present

## 2023-05-20 DIAGNOSIS — W228XXA Striking against or struck by other objects, initial encounter: Secondary | ICD-10-CM | POA: Diagnosis not present

## 2023-05-20 DIAGNOSIS — S99922A Unspecified injury of left foot, initial encounter: Secondary | ICD-10-CM | POA: Diagnosis not present

## 2023-05-22 ENCOUNTER — Ambulatory Visit: Payer: BC Managed Care – PPO | Admitting: Physical Medicine and Rehabilitation

## 2023-05-23 ENCOUNTER — Ambulatory Visit: Payer: BC Managed Care – PPO | Admitting: Physical Medicine and Rehabilitation

## 2023-05-27 ENCOUNTER — Encounter: Payer: Self-pay | Admitting: Podiatry

## 2023-05-27 ENCOUNTER — Ambulatory Visit (INDEPENDENT_AMBULATORY_CARE_PROVIDER_SITE_OTHER): Payer: BC Managed Care – PPO | Admitting: Podiatry

## 2023-05-27 DIAGNOSIS — B351 Tinea unguium: Secondary | ICD-10-CM | POA: Diagnosis not present

## 2023-05-27 MED ORDER — CLOTRIMAZOLE-BETAMETHASONE 1-0.05 % EX CREA: 1.0000 | TOPICAL_CREAM | Freq: Every day | CUTANEOUS | 2 refills | Status: DC

## 2023-05-27 NOTE — Progress Notes (Signed)
Chief Complaint  Patient presents with   Nail Problem    PATIENT STATES SHE CAUGHT FUNGUS ON RF FROM A NAIL SALON AND NOW SHE FEELS IT IS EFFECTING HER OTHER TOES.  PATIENT STATES THAT IT IS A DWELL PAIN RIGHT ON TOP THE NAIL BED ON HER RF HALLUXS . PATIENT STATES THAT SHE IS NOT TAKING ANY PAIN MEDICATION FOR PAIN.    Subjective: 62 y.o. female presenting today for follow-up evaluation of discoloration with thickening to the toenails of the right foot.  Patient states this has been ongoing for about 2 years.  She does have a history of liver pathology and autoimmune hepatitis so she is not a candidate for oral antifungal medication.  She presents for further treatment evaluation  Past Medical History:  Diagnosis Date   Asthma    Autoimmune hepatitis (HCC)    Chronic kidney disease    Fatty liver 2023   CT   Hx of pancreatitis 06/16/2018   Kidney stone    Kidney stone    Migraine    Orthostatic hypotension    Osteopenia 03/31/2012   Recurrent upper respiratory infection (URI)    Ulcerative colitis (HCC) 03/31/2012   Urticaria     Past Surgical History:  Procedure Laterality Date   BIOPSY  01/25/2022   Procedure: BIOPSY;  Surgeon: Kerin Salen, MD;  Location: [facility];  Service: Gastroenterology;;   CESAREAN SECTION     x3   CHOLECYSTECTOMY     COLONOSCOPY     COLONOSCOPY N/A 01/25/2022   Procedure: COLONOSCOPY;  Surgeon: Kerin Salen, MD;  Location: [facility];  Service: Gastroenterology;  Laterality: N/A;   LITHOTRIPSY     THYROIDECTOMY     1997    Allergies  Allergen Reactions   Almond Oil Hives and Shortness Of Breath   Lialda [Mesalamine] Other (See Comments)    Vomiting and rectal bleeding   Other Anaphylaxis    Almonds, tree nuts   Aspirin Hives and Nausea Only    Stomach cramps   Hydromorphone Hcl Other (See Comments)    Caused Enzyme levels to go up   Penicillins Hives and Swelling    Has patient had a PCN reaction causing immediate rash,  facial/tongue/throat swelling, SOB or lightheadedness with hypotension: Yes Has patient had a PCN reaction causing severe rash involving mucus membranes or skin necrosis: No Has patient had a PCN reaction that required hospitalization: No Has patient had a PCN reaction occurring within the last 10 years: No  Stomach cramps If all of the above answers are "NO", then may proceed with Cephalosporin use.    Acetaminophen Other (See Comments)    Has autoimmune hepatitis, wants to avoid APAP   Apriso [Mesalamine Er] Nausea And Vomiting   Corticosteroids Other (See Comments)    Had acute pancreatitis x 2 after steroid use.   Imitrex [Sumatriptan] Swelling    Injection caused swelling   Stadol [Butorphanol] Other (See Comments)    hallucinates   Topamax [Topiramate] Other (See Comments)    Kidney stones   Vancomycin Hives   Azathioprine Rash   Pepcid [Famotidine] Nausea And Vomiting    Makes acid worse   Prednisone Nausea And Vomiting    All steroids causes nausea and acute pancreatitis     RT toenails 05/27/2023  RT foot 05/27/2023  Objective: Physical Exam General: The patient is alert and oriented x3 in no acute distress.  Dermatology: Hyperkeratotic, discolored, thickened, onychodystrophy noted to the right foot. Skin is warm,  dry and supple bilateral lower extremities.  There is some inflammatory dermatitis also noted to the weightbearing surfaces of the lateral aspect of the right foot concerning for possible tinea pedis  Vascular: Palpable pedal pulses bilaterally. No edema or erythema noted. Capillary refill within normal limits.  Neurological: Epicritic and protective threshold grossly intact bilaterally.   Musculoskeletal Exam: No pedal deformity noted  Assessment: #1 Onychomycosis of toenails RT foot #2  Chronic tinea pedis right  Plan of Care:  -Patient evaluated -Patient states that she spoke with her Liver Doctor and it was not recommended to take the oral Lamisil  at this time.  Updated LFTs will be obtained December 2024.  If she is cleared to take the oral Lamisil from her liver doctor we will go ahead and prescribe -Refill prescription for Lotrisone cream apply twice daily.  Patient has been tolerating this well -Also recommend good foot lotion daily.  Advised against when barefoot.  Patient states that she always wear socks with tennis shoes -Continue topical Tolcylen antifungal.  Refills provided at checkout - Patient has also been applying topical essential oils.  Okay to continue - Patient will notify me in December regarding clearance to take the oral Lamisil  *Aesthetician  Felecia Shelling, DPM [facility]  Dr. Felecia Shelling, DPM    [address] [place], [place] [address]                Office [phone]  Fax [phone]

## 2023-05-30 ENCOUNTER — Encounter: Payer: Self-pay | Admitting: Genetic Counselor

## 2023-05-30 ENCOUNTER — Ambulatory Visit: Payer: Self-pay | Admitting: Genetic Counselor

## 2023-05-30 DIAGNOSIS — Z803 Family history of malignant neoplasm of breast: Secondary | ICD-10-CM

## 2023-05-30 DIAGNOSIS — Z8042 Family history of malignant neoplasm of prostate: Secondary | ICD-10-CM

## 2023-05-30 DIAGNOSIS — Z1379 Encounter for other screening for genetic and chromosomal anomalies: Secondary | ICD-10-CM

## 2023-05-30 DIAGNOSIS — Z8041 Family history of malignant neoplasm of ovary: Secondary | ICD-10-CM

## 2023-05-30 DIAGNOSIS — Z800 Family history of malignant neoplasm of digestive organs: Secondary | ICD-10-CM

## 2023-05-30 NOTE — Progress Notes (Signed)
HPI:   Danielle Bond was previously seen in the [facility] due to a family history of ovarian cancer and other cancers and concerns regarding a hereditary predisposition to cancer.    Danielle Bond recent genetic test results were disclosed to her by telephone. These results and recommendations are discussed in more detail below.  CANCER HISTORY:  Oncology History   No history exists.    FAMILY HISTORY:  We obtained a detailed, 4-generation family history.  Significant diagnoses are listed below:      Family History  Problem Relation Age of Onset   Colon cancer Maternal Aunt          dx 89s   Ovarian cancer Paternal Aunt 1   Prostate cancer Paternal Uncle          metastatic; dx 45s   Crohn's disease Daughter 19   Breast cancer Cousin          paternal female cousin; dx 74s   Colon cancer Cousin 2        paternal female cousin       Danielle Bond is unaware of previous family history of genetic testing for hereditary cancer risks. Other relatives were unavailable for genetic testing at this time.    She reported a small amount of Ashkenazi Jewish ancestry (less than 1% per ancestry genetic testing). There is no known consanguinity.    GENETIC TEST RESULTS:  The [facility] CustomNext-Cancer +RNAinsight Panel found no pathogenic mutations.   The CustomNext-Cancer+RNAinsight panel offered by [facility] includes sequencing, rearrangement, and RNA analysis for the following 47 genes:  APC, ATM, AXIN2, BAP1, BARD1, BMPR1A, BRCA1, BRCA2, BRIP1, CDH1, CDK4, CDKN2A, CHEK2, CTNNA1, DICER1, EPCAM, FH, GREM1, HOXB13, KIT, MEN1, MLH1, MSH2, MSH3, MSH6, MUTYH, NF1, NTHL1, PALB2, PDGFRA, PMS2, POLD1, POLE, PTEN, RAD51C, RAD51D, SDHA, SDHB, SDHC, SDHD, SMAD4, SMARCA4, STK11, TP53, TSC1, TSC2, VHL.    The test report has been scanned into EPIC and is located under the Molecular Pathology section of the Results Review tab.  A portion of the result report is included below for  reference. Genetic testing reported out on May 24, 2023.       Even though a pathogenic variant was not identified, possible explanations for the cancer in the family may include: There may be no hereditary risk for cancer in the family. The cancers in Danielle Bond and/or her family may be sporadic/familial or due to other genetic and environmental factors.  Most cancer is not hereditary.  There may be a gene mutation in one of these genes that current testing methods cannot detect but that chance is small. There could be another gene that has not yet been discovered, or that we have not yet tested, that is responsible for the cancer diagnoses in the family.  It is also possible there is a hereditary cause for the cancer in the family that Danielle Bond did not inherit.   Therefore, it is important to remain in touch with cancer genetics in the future so that we can continue to offer Danielle Bond the most up to date genetic testing.    ADDITIONAL GENETIC TESTING:   Danielle Bond genetic testing was fairly extensive.  If there are additional relevant genes identified to increase cancer risk that can be analyzed in the future, we would be happy to discuss and coordinate this testing at that time.      CANCER SCREENING RECOMMENDATIONS:  Danielle Bond test result is considered negative (normal).  This  means that we have not identified a hereditary cause for her family history of cancer at this time.   An individual's cancer risk and medical management are not determined by genetic test results alone. Overall cancer risk assessment incorporates additional factors, including personal medical history, family history, and any available genetic information that may result in a personalized plan for cancer prevention and surveillance. Therefore, it is recommended she continue to follow the cancer management and screening guidelines provided by her primary healthcare provider.  Her lifetime risk for  breast cancer based on Tyrer-Cuzick risk model and reported personal/family history is 4.1%.  This is not in the 'high risk for breast cancer' category per NCCN guidelines (>20%).  This risk estimate can change over time and may be repeated to reflect new information in her personal or family history in the future.         RECOMMENDATIONS FOR FAMILY MEMBERS:   Since she did not inherit a identifiable mutation in a cancer predisposition gene included on this panel, her children could not have inherited a known mutation from her in one of these genes. Individuals in this family might be at some increased risk of developing cancer, over the general population risk, due to the family history of cancer.  Individuals in the family should notify their providers of the family history of cancer. We recommend women in this family have a yearly mammogram beginning at age 41, or 77 years younger than the earliest onset of cancer, an annual clinical breast exam, and perform monthly breast self-exams.  Risk models that take into account family history and hormonal history may be helpful in determining appropriate breast cancer screening options for family members.  Other members of the family may still carry a pathogenic variant in one of these genes that Danielle Bond did not inherit. Based on the family history her paternal cousin with breast cancer in her 30s asl well as  first degree relatives of her paternal aunt (who had ovarian cancer) and paternal uncle (who had metastatic prostate cancer) have genetic counseling and testing. Danielle Bond can let us know if we can be of any assistance in coordinating genetic counseling and/or testing for these family member.     FOLLOW-UP:  Cancer genetics is a rapidly advancing field and it is possible that new genetic tests will be appropriate for her and/or her family members in the future. We encourage Danielle Bond to remain in contact with cancer genetics, so we can update  her personal and family histories and let her know of advances in cancer genetics that may benefit this family.   Our contact number was provided.  They are welcome to call us at anytime with additional questions or concerns.   Sheridan Hew M. Rennie Plowman, Danielle Bond, [facility] Genetic Counselor [email or web address] (P) [phone]

## 2023-05-31 ENCOUNTER — Ambulatory Visit (INDEPENDENT_AMBULATORY_CARE_PROVIDER_SITE_OTHER): Payer: Self-pay | Admitting: Podiatrist

## 2023-05-31 DIAGNOSIS — B351 Tinea unguium: Secondary | ICD-10-CM

## 2023-05-31 NOTE — Progress Notes (Signed)
Patient presents today for the 4th laser treatment. Diagnosed with mycotic nail infection by Dr. Logan Bores.   Toenail most affected Right hallux and 5th toenail right.  All other systems are negative.  Due to past medical history she is unable to take lamisil   Nails were filed thin. Laser therapy was administered to 1-5 toenails right foot and patient tolerated the treatment well. All safety precautions were in place.   Single laser pass was done on non-affected nails.  She is using a homeopathic topical on her toenails which also appears to be helping the moisture and appearance of the nails.    Follow up in 6 weeks for laser # 5

## 2023-06-04 ENCOUNTER — Ambulatory Visit: Payer: BC Managed Care – PPO | Admitting: Physical Medicine and Rehabilitation

## 2023-06-04 ENCOUNTER — Encounter: Payer: Self-pay | Admitting: Physical Medicine and Rehabilitation

## 2023-06-04 VITALS — BP 142/84 | HR 92

## 2023-06-04 DIAGNOSIS — G8929 Other chronic pain: Secondary | ICD-10-CM

## 2023-06-04 DIAGNOSIS — M545 Low back pain, unspecified: Secondary | ICD-10-CM

## 2023-06-04 DIAGNOSIS — M7918 Myalgia, other site: Secondary | ICD-10-CM

## 2023-06-04 NOTE — Progress Notes (Signed)
Functional Pain Scale - descriptive words and definitions  Distracting (5)    Aware of pain/able to complete some ADL's but limited by pain/sleep is affected and active distractions are only slightly useful. Moderate range order  Average Pain 5  8-wk follow up, pain in right side and leg. Dry needling didn't help. Wants to discuss Mri. Had injection in April.

## 2023-06-04 NOTE — Progress Notes (Signed)
Danielle Bond - 62 y.o. female MRN [id number]  Date of birth: Sep 23, 1960  Office Visit Note: Visit Date: 06/04/2023 PCP: Ardith Dark, MD Referred by: Ardith Dark, MD  Subjective: Chief Complaint  Patient presents with   Lower Back - Pain   Right Leg - Pain   HPI: Danielle Bond is a 62 y.o. female who comes in today for evaluation of chronic right lower back pain. She reports localized area of tenderness to right lower back near waist line. Pain ongoing for several months, worsens with prolonged sitting. She describes her pain as sore and aching, currently rates as 6 out of 10. Lumbar MRI imaging from August of this year ([facility]) shows no significant changes compared to MRI imaging from 2022. There are mild degenerative changes, no significant nerve root impingement. No high grade spinal canal stenosis. She has tried multiple conservative therapies such as home exercise regimen, rest, physical therapy and dry needling with minimal relief of pain. She has plans to start chiropractic treatments with [facility] in the coming days. She reports most significant relief of pain with massage therapy and continued use of TENS unit. She has undergone multiple right sacroiliac joint injections in our office, she reports intermittent relief of pain with these injections, lasting about 3 weeks. Patient was previously treated by Dr. Casimer Lanius at [facility]. I did place referral for Dr. Sheliah Hatch back in April as she did voice request for new rheumatological provider. She has not yet scheduled this appointment. Patient voiced that she is not interested in spine injections and chronic pain management. States she will not take narcotic medications. Of note, she does list 16 allergies/intolerances to medications. Patient denies focal weakness, numbness and tingling. No recent trauma or falls.    Patient's course is complicated by ulcerative colitis, autoimmune  hepatitis, arthralgia and osteoporosis.       Review of Systems  Musculoskeletal:  Positive for back pain and myalgias.  Neurological:  Negative for tingling, sensory change, focal weakness and weakness.  All other systems reviewed and are negative.  Otherwise per HPI.  Assessment & Plan: Visit Diagnoses:    ICD-10-CM   1. Chronic right-sided low back pain without sciatica  M54.50    G89.29     2. Myofascial pain syndrome  M79.18        Plan: Findings:  Chronic right sided lower back pain. Localized area of pain to right waistline. Patient continues to have pain despite good conservative therapies. She was unable to tolerate recent dry needling due to increased pain post treatment. She came in today with several questions regarding bulging disc and lumbar MRI findings. I reassured her that recent lumbar MRI imaging looked fairly normal for her age. There is small disc bulge and left paracentral disc disc protrusion at T12-L1 that is not causing impingement/compression. I explained to her that bulging discs are quite common and in her case do not feel this is the source of pain. She has history of polyarthralgia and diffuse body pain. Her symptoms do seem consistent with myofascial pain or some type of central sensitization syndrome such as fibromyalgia. Patient adamant that we do not diagnose her with fibromyalgia as she is unsure if she truly has this condition. We discussed treatment plan in detail today. Would not recommend proceeding with interventional spine procedures at this time as we do not feel her pain is directly related to her spine. I encouraged her to continue with massage therapy,  use of TENS unit, and chiropractic treatments. She can speak with her PCP regarding possible pain syndrome/central sensitization disorder. We are happy to see her back as needed. No red flag symptoms noted upon exam today.     Meds & Orders: No orders of the defined types were placed in this  encounter.  No orders of the defined types were placed in this encounter.   Follow-up: Return if symptoms worsen or fail to improve.   Procedures: No procedures performed      Clinical History: INDICATION: Lumbar radiculopathy. Tingling sensation radiating down the RIGHT leg.   STUDY: MRI of the lumbar spine without intravenous contrast performed on 03/13/2023 10:37 AM.   COMPARISON: 11/08/2020.   TECHNIQUE: Multiplanar, multisequence MR imaging obtained through the lumbar spine without contrast on 03/13/2023 10:37 AM.   CONTRAST: None.   FINDINGS:  # Osseous structures: Anterolateral osteophyte at T12-L1. Vertebral body heights are well-maintained. No acute fracture or concerning marrow signal abnormality appreciated.  #  Alignment:Mild levocurvature noted. No significant listhesis appreciated.  #  Conus medullaris/cauda equina: Spinal cord signal is normal and terminates at L1. The cauda equina is unremarkable.   #  Lower thoracic spine: Disc desiccation. No significant spinal canal or foraminal stenosis. This is viewed on the sagittal images only.   #  T12-L1: Disc desiccation with a small disc bulge and a superimposed LEFT paracentral disc protrusion. This effaces the ventral thecal sac. Neuroforamen are patent. No significant interval change.  #  L1-L2: Mild disc desiccation with a minimal disc bulge. No significant stenosis. This is viewed on the sagittal images only and is without significant interval change.  #  L2-L3: Mild disc desiccation with a small disc bulge. No significant stenosis. This is unchanged.  #  L3-L4: Mild disc desiccation with a small disc bulge. No significant stenosis. This is unchanged.  #  L4-L5: Mild disc desiccation with a small disc bulge and facet arthrosis. No significant stenosis. This is unchanged.  #  L5-S1: Mild disc desiccation with a posterior disc herniation that is more pronounced on the LEFT. Mild facet arthrosis. No significant stenosis. This  is unchanged.   #  Paraspinal tissues: Unremarkable.   #  Additional comments: None.    IMPRESSION:  1. No acute process or significant interval change.  2.  Mild degenerative changes as described above. No significant nerve root encroachment.   Electronically Signed by: Ardyth Man MD, PHD on 03/18/2023 8:43 AM   She reports that she has never smoked. She has never used smokeless tobacco.  Recent Labs    06/28/22 1201 09/18/22 1108 12/18/22 1033  HGBA1C 5.9 6.4 6.1    Objective:  VS:  HT:    WT:   BMI:     BP:(!) 142/84  HR:92bpm  TEMP: ( )  RESP:  Physical Exam Vitals and nursing note reviewed.  HENT:     Head: Normocephalic and atraumatic.     Right Ear: External ear normal.     Left Ear: External ear normal.     Nose: Nose normal.     Mouth/Throat:     Mouth: Mucous membranes are moist.  Eyes:     Extraocular Movements: Extraocular movements intact.  Cardiovascular:     Rate and Rhythm: Normal rate.     Pulses: Normal pulses.  Pulmonary:     Effort: Pulmonary effort is normal.  Abdominal:     General: Abdomen is flat. There is no distension.  Musculoskeletal:  General: Tenderness present.     Cervical back: Normal range of motion.     Comments: Patient rises from seated position to standing without difficulty. Good lumbar range of motion. No pain noted with facet loading. 5/5 strength noted with bilateral hip flexion, knee flexion/extension, ankle dorsiflexion/plantarflexion and EHL. No clonus noted bilaterally. No pain upon palpation of greater trochanters. No pain with internal/external rotation of bilateral hips. Sensation intact bilaterally. Negative slump test bilaterally. Ambulates without aid, gait steady.     Skin:    General: Skin is warm and dry.     Capillary Refill: Capillary refill takes less than 2 seconds.  Neurological:     General: No focal deficit present.     Mental Status: She is alert and oriented to person, place, and time.   Psychiatric:        Mood and Affect: Mood normal.        Behavior: Behavior normal.     Ortho Exam  Imaging: No results found.  Past Medical/Family/Surgical/Social History: Medications & Allergies reviewed per EMR, new medications updated. Patient Active Problem List   Diagnosis Date Noted   Genetic testing 05/30/2023   Prediabetes 09/20/2022   Chronic cough 08/07/2022   Abnormal CT of the chest 08/07/2022   Palpitations 03/29/2022   Precordial pain 03/29/2022   Chronic rhinitis 03/21/2022   Onychomycosis 01/30/2022   Arthralgia 07/19/2021   Immunosuppressed status (HCC) 07/19/2021   Eustachian tube dysfunction 06/13/2021   Osteoporosis 07/11/2020   Atherosclerosis of native coronary artery of native heart without angina pectoris 06/08/2019   Aortic atherosclerosis (HCC) 06/08/2019   Dyslipidemia 05/02/2019   Postural dizziness with presyncope 05/02/2019   HSV-2 (herpes simplex virus 2) infection 11/04/2018   Hypothyroidism s/p total thyroidectomy 1997 08/15/2018   Autoimmune hepatitis (HCC) 08/16/2012   Asthma 03/31/2012   Chronic ulcerative pancolitis (HCC) 03/31/2012   History of renal calculi 03/31/2012   Migraines 03/31/2012   Mitral valve prolapse    Past Medical History:  Diagnosis Date   Asthma    Autoimmune hepatitis (HCC)    Chronic kidney disease    Fatty liver 2023   CT   Hx of pancreatitis 06/16/2018   Kidney stone    Kidney stone    Migraine    Orthostatic hypotension    Osteopenia 03/31/2012   Recurrent upper respiratory infection (URI)    Ulcerative colitis (HCC) 03/31/2012   Urticaria    Family History  Problem Relation Age of Onset   Heart disease Mother    Dementia Mother    Thyroid disease Mother    Osteoarthritis Mother    Asthma Mother    Heart attack Father    Heart attack Brother    Heart disease Brother    Colon cancer Maternal Aunt        dx 27s   Ovarian cancer Paternal Aunt 5   Prostate cancer Paternal Uncle         metastatic; dx 79s   Heart disease Paternal Grandmother    Crohn's disease Daughter 50   Breast cancer Cousin        paternal female cousin; dx 30s   Colon cancer Cousin 42       paternal female cousin   Stomach cancer Neg Hx    Rectal cancer Neg Hx    Esophageal cancer Neg Hx    Liver cancer Neg Hx    Pancreatic cancer Neg Hx    Past Surgical History:  Procedure Laterality Date  BIOPSY  01/25/2022   Procedure: BIOPSY;  Surgeon: Kerin Salen, MD;  Location: [facility];  Service: Gastroenterology;;   CESAREAN SECTION     x3   CHOLECYSTECTOMY     COLONOSCOPY     COLONOSCOPY N/A 01/25/2022   Procedure: COLONOSCOPY;  Surgeon: Kerin Salen, MD;  Location: [facility];  Service: Gastroenterology;  Laterality: N/A;   LITHOTRIPSY     THYROIDECTOMY     1997   Social History   Occupational History   Occupation: Self-employed  Tobacco Use   Smoking status: Never   Smokeless tobacco: Never  Vaping Use   Vaping status: Never Used  Substance and Sexual Activity   Alcohol use: No   Drug use: No   Sexual activity: Yes

## 2023-06-13 ENCOUNTER — Encounter: Payer: Self-pay | Admitting: Family Medicine

## 2023-06-13 ENCOUNTER — Ambulatory Visit (INDEPENDENT_AMBULATORY_CARE_PROVIDER_SITE_OTHER): Payer: BC Managed Care – PPO | Admitting: Family Medicine

## 2023-06-13 VITALS — BP 129/81 | HR 99 | Temp 97.7°F | Ht <= 58 in | Wt 155.0 lb

## 2023-06-13 DIAGNOSIS — K754 Autoimmune hepatitis: Secondary | ICD-10-CM | POA: Diagnosis not present

## 2023-06-13 DIAGNOSIS — J4540 Moderate persistent asthma, uncomplicated: Secondary | ICD-10-CM

## 2023-06-13 DIAGNOSIS — H6993 Unspecified Eustachian tube disorder, bilateral: Secondary | ICD-10-CM | POA: Diagnosis not present

## 2023-06-13 DIAGNOSIS — J029 Acute pharyngitis, unspecified: Secondary | ICD-10-CM | POA: Diagnosis not present

## 2023-06-13 LAB — POCT RAPID STREP A (OFFICE): Rapid Strep A Screen: POSITIVE — AB

## 2023-06-13 MED ORDER — AZITHROMYCIN 250 MG PO TABS: ORAL_TABLET | ORAL | 0 refills | Status: DC

## 2023-06-13 NOTE — Progress Notes (Signed)
   Danielle Bond is a 62 y.o. female who presents today for an office visit.  Assessment/Plan:  New/Acute Problems: Strep Pharyngitis  Rapid strep positive.  She has a penicillin allergy.  Will start azithromycin.  She has tolerated this well in the past.  Encouraged hydration.  Need to be careful with OTC meds due to her history of questionable autoimmune hepatitis however would be fine for her to take ibuprofen or naproxen as needed.  Encouraged hydration.  Discussed reasons to return to care.  Follow-up as needed.  Chronic Problems Addressed Today: Asthma Reassuring lung exam today without any signs of acute flare.  Continue current regimen per pulmonology.  She can use albuterol as needed.  Autoimmune hepatitis Follows GI for this.  Recent labs have been reassuring.  Eustachian tube dysfunction Mild middle ear effusion today related to her URI.  Hopefully this should improve as we are treating her strep pharyngitis.  She will let us know if symptoms do not improve over the next several days.     Subjective:  HPI:  See Assessment / plan for status of chronic conditions. Patient is here today with sore throat. This started yesterday. She took a COVID test yesterday which was negative. No fever initially but last night started developing subjective fevers. She is having some chest congestion. She is having minimal cough. She is having some ear pressure. She was at a concert 4 days ago and there was another person behind her coughing a lot.        Objective:  Physical Exam: BP 129/81   Pulse 99   Temp 97.7 F (36.5 C) (Temporal)   Ht 4' (1.219 m)   Wt 155 lb (70.3 kg)   LMP 03/03/2013   SpO2 95%   BMI 47.30 kg/m   Gen: No acute distress, resting comfortably HEENT: TMs with clear effusion.  OP erythematous. CV: Regular rate and rhythm with no murmurs appreciated Pulm: Normal work of breathing, clear to auscultation bilaterally with no crackles, wheezes, or rhonchi Neuro:  Grossly normal, moves all extremities Psych: Normal affect and thought content      Jovoni Borkenhagen M. Jimmey Ralph, MD 06/13/2023 7:54 AM

## 2023-06-13 NOTE — Assessment & Plan Note (Signed)
Follows GI for this.  Recent labs have been reassuring.

## 2023-06-13 NOTE — Assessment & Plan Note (Signed)
Reassuring lung exam today without any signs of acute flare.  Continue current regimen per pulmonology.  She can use albuterol as needed.

## 2023-06-13 NOTE — Assessment & Plan Note (Signed)
Mild middle ear effusion today related to her URI.  Hopefully this should improve as we are treating her strep pharyngitis.  She will let us know if symptoms do not improve over the next several days.

## 2023-06-13 NOTE — Patient Instructions (Addendum)
It was very nice to see you today!  You have strep throat.  Please start the azithromycin.  Return if symptoms worsen or fail to improve.   Take care, Dr Jimmey Ralph  PLEASE NOTE:  If you had any lab tests, please let us know if you have not heard back within a few days. You may see your results on mychart before we have a chance to review them but we will give you a call once they are reviewed by [name].   If we ordered any referrals today, please let us know if you have not heard from their office within the next week.   If you had any urgent prescriptions sent in today, please check with the pharmacy within an hour of our visit to make sure the prescription was transmitted appropriately.   Please try these tips to maintain a healthy lifestyle:  Eat at least 3 REAL meals and 1-2 snacks per day.  Aim for no more than 5 hours between eating.  If you eat breakfast, please do so within one hour of getting up.   Each meal should contain half fruits/vegetables, one quarter protein, and one quarter carbs (no bigger than a computer mouse)  Cut down on sweet beverages. This includes juice, soda, and sweet tea.   Drink at least 1 glass of water with each meal and aim for at least 8 glasses per day  Exercise at least 150 minutes every week.

## 2023-06-15 DIAGNOSIS — R059 Cough, unspecified: Secondary | ICD-10-CM | POA: Diagnosis not present

## 2023-06-15 DIAGNOSIS — Z20822 Contact with and (suspected) exposure to covid-19: Secondary | ICD-10-CM | POA: Diagnosis not present

## 2023-06-15 DIAGNOSIS — J45909 Unspecified asthma, uncomplicated: Secondary | ICD-10-CM | POA: Diagnosis not present

## 2023-06-17 ENCOUNTER — Telehealth: Payer: Self-pay | Admitting: Family Medicine

## 2023-06-17 NOTE — Telephone Encounter (Signed)
FYI  Patient states following OV on 11/21 she began the azithromycin she was prescribed. States she began feeling worse on Friday and went to UC on Saturday. States she tested negative for flu, strept, and covid. States they diagnosed her with an URI despite no CXR being completed. Patient is currently on 6 day taper and is improving.

## 2023-06-17 NOTE — Telephone Encounter (Signed)
See note

## 2023-06-17 NOTE — Telephone Encounter (Signed)
I appreciate the update.  I am glad that she is feeling better.  She should let us know if her symptoms do not continue to improve.

## 2023-06-24 ENCOUNTER — Other Ambulatory Visit: Payer: Self-pay | Admitting: *Deleted

## 2023-06-24 ENCOUNTER — Telehealth: Payer: Self-pay | Admitting: Gastroenterology

## 2023-06-24 ENCOUNTER — Telehealth: Payer: Self-pay | Admitting: Internal Medicine

## 2023-06-24 DIAGNOSIS — K51019 Ulcerative (chronic) pancolitis with unspecified complications: Secondary | ICD-10-CM

## 2023-06-24 DIAGNOSIS — R053 Chronic cough: Secondary | ICD-10-CM

## 2023-06-24 DIAGNOSIS — M810 Age-related osteoporosis without current pathological fracture: Secondary | ICD-10-CM | POA: Diagnosis not present

## 2023-06-24 DIAGNOSIS — Z780 Asymptomatic menopausal state: Secondary | ICD-10-CM | POA: Diagnosis not present

## 2023-06-24 LAB — HM DEXA SCAN

## 2023-06-24 NOTE — Telephone Encounter (Signed)
Pt called again and states the message should have been:  Patient recently diagnosed with strep throat 2.5 weeks ago. States it has moved into her chest as she is coughing up colored phelgm and now wheezing. she is using Delsym OTC medicine. She has taken round of prednisone and does not want more. No acutes available at time of call. She would like a call back. Please advise.

## 2023-06-24 NOTE — Telephone Encounter (Signed)
Left message for patient advising that reminder is in the computer to have her come for labs the week of 07/01/23. Orders have been placed in [facility] for CBC, CMET, IgG and she may come next week between 8am-5pm to have that completed.

## 2023-06-24 NOTE — Telephone Encounter (Signed)
Patient recently diagnosed with strep throat 2.5 weeks ago. States it has moved into her chest as she is coughing up colored phelgm and now wheezing. she is suing Delsym OTC medicine. She has taken round of prednisone and does not want more. No acutes available at time of call. Please advise.

## 2023-06-24 NOTE — Telephone Encounter (Signed)
Patient called and stated that Dr. Adela Lank told her that at the first week of December someone would be getting in touch with her about new lab work being done, because of the medication she is taking. Patient is requesting a call back. Please advise.

## 2023-06-24 NOTE — Telephone Encounter (Signed)
ATC x1.  LVM to call back after 8:30 am 12/3 as the office is now closed.

## 2023-06-24 NOTE — Telephone Encounter (Signed)
Patient advised.

## 2023-06-24 NOTE — Telephone Encounter (Signed)
Please advise 

## 2023-06-25 NOTE — Telephone Encounter (Signed)
Called pt, no answer, lvmm for pt to come in for a cxr. Order for cxr has been placed

## 2023-06-25 NOTE — Telephone Encounter (Signed)
Spoke with patient, stated feeling better today, had a UC visit due to chest congestion was given prednisone, done with Rx at this time  Has a Pulmonology appt tomorrow and Chest X-ray. Will schedule a F/U appt with PCP once feeling better for TDAP Vaccine

## 2023-06-25 NOTE — Telephone Encounter (Signed)
    Okay for chest x-ray   She is also welcome to try a course of antibiotics -if she is interested we can send a Z-Pak  Allergies  Allergen Reactions   Almond Oil Hives and Shortness Of Breath   Lialda [Mesalamine] Other (See Comments)    Vomiting and rectal bleeding   Other Anaphylaxis    Almonds, tree nuts   Aspirin Hives and Nausea Only    Stomach cramps   Hydromorphone Hcl Other (See Comments)    Caused Enzyme levels to go up   Penicillins Hives and Swelling    Has patient had a PCN reaction causing immediate rash, facial/tongue/throat swelling, SOB or lightheadedness with hypotension: Yes Has patient had a PCN reaction causing severe rash involving mucus membranes or skin necrosis: No Has patient had a PCN reaction that required hospitalization: No Has patient had a PCN reaction occurring within the last 10 years: No  Stomach cramps If all of the above answers are "NO", then may proceed with Cephalosporin use.    Acetaminophen Other (See Comments)    Has autoimmune hepatitis, wants to avoid APAP   Apriso [Mesalamine Er] Nausea And Vomiting   Corticosteroids Other (See Comments)    Had acute pancreatitis x 2 after steroid use.   Imitrex [Sumatriptan] Swelling    Injection caused swelling   Stadol [Butorphanol] Other (See Comments)    hallucinates   Topamax [Topiramate] Other (See Comments)    Kidney stones   Vancomycin Hives   Azathioprine Rash   Pepcid [Famotidine] Nausea And Vomiting    Makes acid worse   Prednisone Nausea And Vomiting    All steroids causes nausea and acute pancreatitis

## 2023-06-25 NOTE — Telephone Encounter (Signed)
Pt called back stated she has strep 2 weeks ago, and went to pcp who placed her on abx. States she then got worse and went to UC who gave her steroids and she is still dealing with chest congestion, wheezing cough. (Green phlegm) no longer has strep, using inhaler. Pt requesting cxr please advise

## 2023-06-25 NOTE — Telephone Encounter (Signed)
Can we clarify?  We sent in azithromycin. We did not send in the prednisone. If she is still having a cough then we can order a chest xray though recommend she come in for another visit if still having symptoms.  Danielle Bond. Jimmey Ralph, MD 06/25/2023 2:03 PM

## 2023-06-25 NOTE — Telephone Encounter (Signed)
ATC pt no answer, lvmm for pt to give the office a call back.

## 2023-06-25 NOTE — Telephone Encounter (Signed)
Called and spoke with pt about cxr orders who states she will be here in the morning around 9:00am. Nfn order placed closing encounter

## 2023-06-26 ENCOUNTER — Ambulatory Visit: Payer: BC Managed Care – PPO

## 2023-06-26 ENCOUNTER — Telehealth: Payer: Self-pay

## 2023-06-26 DIAGNOSIS — R053 Chronic cough: Secondary | ICD-10-CM

## 2023-06-26 DIAGNOSIS — E559 Vitamin D deficiency, unspecified: Secondary | ICD-10-CM | POA: Diagnosis not present

## 2023-06-26 DIAGNOSIS — R059 Cough, unspecified: Secondary | ICD-10-CM | POA: Diagnosis not present

## 2023-06-26 NOTE — Telephone Encounter (Signed)
Lab orders are in. Called patient and left detailed message to go to the lab at her earliest convenience.

## 2023-06-26 NOTE — Telephone Encounter (Signed)
-----   Message from [facility] Marylu Lund H sent at 05/01/2023  8:44 AM EDT ----- Regarding: due for labs in December Due for cbc, cmet, IgG in December

## 2023-06-28 ENCOUNTER — Other Ambulatory Visit: Payer: Self-pay | Admitting: Family Medicine

## 2023-06-28 ENCOUNTER — Telehealth: Payer: Self-pay | Admitting: Internal Medicine

## 2023-06-28 NOTE — Telephone Encounter (Signed)
Patient would like to know the results of her x-ray. Please call and advise. She does not use MyChart

## 2023-07-01 ENCOUNTER — Telehealth: Payer: Self-pay | Admitting: Gastroenterology

## 2023-07-01 ENCOUNTER — Other Ambulatory Visit (INDEPENDENT_AMBULATORY_CARE_PROVIDER_SITE_OTHER): Payer: BC Managed Care – PPO

## 2023-07-01 DIAGNOSIS — K51019 Ulcerative (chronic) pancolitis with unspecified complications: Secondary | ICD-10-CM

## 2023-07-01 LAB — CBC WITH DIFFERENTIAL/PLATELET
Basophils Absolute: 0 10*3/uL (ref 0.0–0.1)
Basophils Relative: 0.4 % (ref 0.0–3.0)
Eosinophils Absolute: 0 10*3/uL (ref 0.0–0.7)
Eosinophils Relative: 0.6 % (ref 0.0–5.0)
HCT: 37.6 % (ref 36.0–46.0)
Hemoglobin: 12.7 g/dL (ref 12.0–15.0)
Lymphocytes Relative: 24.8 % (ref 12.0–46.0)
Lymphs Abs: 1.4 10*3/uL (ref 0.7–4.0)
MCHC: 33.8 g/dL (ref 30.0–36.0)
MCV: 91.3 fL (ref 78.0–100.0)
Monocytes Absolute: 0.6 10*3/uL (ref 0.1–1.0)
Monocytes Relative: 10.7 % (ref 3.0–12.0)
Neutro Abs: 3.6 10*3/uL (ref 1.4–7.7)
Neutrophils Relative %: 63.5 % (ref 43.0–77.0)
Platelets: 302 10*3/uL (ref 150.0–400.0)
RBC: 4.11 Mil/uL (ref 3.87–5.11)
RDW: 13.6 % (ref 11.5–15.5)
WBC: 5.7 10*3/uL (ref 4.0–10.5)

## 2023-07-01 LAB — COMPREHENSIVE METABOLIC PANEL
ALT: 26 U/L (ref 0–35)
AST: 36 U/L (ref 0–37)
Albumin: 4.5 g/dL (ref 3.5–5.2)
Alkaline Phosphatase: 59 U/L (ref 39–117)
BUN: 13 mg/dL (ref 6–23)
CO2: 27 meq/L (ref 19–32)
Calcium: 9.4 mg/dL (ref 8.4–10.5)
Chloride: 105 meq/L (ref 96–112)
Creatinine, Ser: 0.79 mg/dL (ref 0.40–1.20)
GFR: 80.04 mL/min (ref >60.00)
Glucose, Bld: 127 mg/dL — ABNORMAL HIGH (ref 70–99)
Potassium: 4.2 meq/L (ref 3.5–5.1)
Sodium: 141 meq/L (ref 135–145)
Total Bilirubin: 0.8 mg/dL (ref 0.2–1.2)
Total Protein: 7.6 g/dL (ref 6.0–8.3)

## 2023-07-01 NOTE — Telephone Encounter (Signed)
Inbound call from patient, she states her previous GI was the provider who managed her B12 and Folic Acid, so she would like Dr. Tomma Rakers recommendation, if she should continue or not.

## 2023-07-01 NOTE — Telephone Encounter (Signed)
PT wants to know if folic acid and B12 will be checked with labs today. She also wants to know if she can continue to take them as well. Please advise.

## 2023-07-01 NOTE — Telephone Encounter (Signed)
Called and left message for patient that Dr. Adela Lank is checking a CBC and CMET for her.  It appears that Dr. Jimmey Ralph manages her folic acid and B12 so I encouraged her to call his office about mamangment of these medictions.

## 2023-07-02 LAB — IGG: IgG (Immunoglobin G), Serum: 1374 mg/dL (ref 600–1540)

## 2023-07-02 NOTE — Telephone Encounter (Signed)
Generally we continue B12 when that is started, I think she should continue that. I don't feel strongly about the folic acid supplementation. Her recently labs are normal - if you can let her know, would like to repeat CBC, CMET, and IgG in another 6 months. If she has anemia or changes in her labs at that time concerning for folate deficiency we can add that to her labs. Thanks

## 2023-07-02 NOTE — Telephone Encounter (Signed)
Patient would like to know the results of her x-ray. Please call and advise. She does not use MyChart

## 2023-07-02 NOTE — Telephone Encounter (Signed)
Atc pt no answer, unable to leave voicemail

## 2023-07-02 NOTE — Telephone Encounter (Signed)
Called patient and LM with results and recommendations. Reminder placed for labs in 6 months

## 2023-07-03 NOTE — Telephone Encounter (Signed)
Pt state she didn't get the call, pls call patient back

## 2023-07-03 NOTE — Telephone Encounter (Signed)
Looks cler to me

## 2023-07-03 NOTE — Telephone Encounter (Signed)
Called and spoke to patient. She is requesting CXR results.  She is aware that report has not been read by radiologist. Advised pt that it can take 2-3 weeks to be read. She is requesting for Dr. Marchelle Gearing to review imaging.   MR, please advise. Thanks

## 2023-07-04 NOTE — Telephone Encounter (Signed)
ATC x1.  No answer, left detailed message letting her know that Dr. Marchelle Gearing stated that her CXR was clear and to call with any questions.

## 2023-07-05 NOTE — Telephone Encounter (Signed)
Atc x2 no answer, lvmm

## 2023-07-06 NOTE — Telephone Encounter (Signed)
Due to multiple attempts in trying to reach without being able to do so, per protocol encounter will be closed.

## 2023-07-08 ENCOUNTER — Telehealth: Payer: Self-pay | Admitting: Internal Medicine

## 2023-07-08 ENCOUNTER — Other Ambulatory Visit: Payer: Self-pay | Admitting: Internal Medicine

## 2023-07-08 NOTE — Telephone Encounter (Signed)
Patient states she just has a dry cough, she is using delsym at bedtime and that is controlling her cough.  She says she sometimes has a cough in the morning as well.  Advised she can use the delsym every 12 hours as needed so she can use a dose during the day if needed.  She is doing well now and there is nothing that she is needing at this time.  Closing encounter.

## 2023-07-10 ENCOUNTER — Ambulatory Visit: Payer: Self-pay | Admitting: Family Medicine

## 2023-07-10 ENCOUNTER — Telehealth: Payer: Self-pay | Admitting: Family Medicine

## 2023-07-10 NOTE — Telephone Encounter (Signed)
Copied from CRM [id number]. Topic: Clinical - Red Word Triage >> Jul 10, 2023  4:47 PM Sonny Dandy B wrote: Red Word that prompted transfer to Nurse Triage: pt called to advise she is light headed and dizzy, elevated blood sugar   Chief Complaint: Dizziness Symptoms: Dizziness and lightheadedness  Frequency: One week Pertinent Negatives: Patient denies SOB, chest pain, and vomiting Disposition: [] ED /[] Urgent Care (no appt availability in office) / [x] Appointment(In office/virtual)/ []  [facility]/ [] Home Care/ [] Refused Recommended Disposition /[] [facility] [facility]/ []  Follow-up with PCP Additional Notes: Patient called in complaining of feeling lightheaded and dizzy. She also stated that her most recent blood sugar was 108 and her sugars normally run in the 90s. Patient stated that she has been tapering off of prednisone and has been off of it for about a week now. Symptoms started after she had completely tapered off of the prednisone. Patient stated that she has had similar symptoms while tapering from steroids before. Patient denies SOB, chest pain, and vomiting. I advised that patient be seen in the office within 3 days. Appointment scheduled for tomorrow morning, 12/19. Patient complied.   Reason for Disposition  [1] MILD dizziness (e.g., walking normally) AND [2] has NOT been evaluated by doctor (or NP/PA) for this  (Exception: Dizziness caused by heat exposure, sudden standing, or poor fluid intake.)  Answer Assessment - Initial Assessment Questions 1. DESCRIPTION: "Describe your dizziness."     Happens when standing- patient states it feels like "pressure or pulling on top of head" 2. LIGHTHEADED: "Do you feel lightheaded?" (e.g., somewhat faint, woozy, weak upon standing)     Yes, when standing 3. VERTIGO: "Do you feel like either you or the room is spinning or tilting?" (i.e. vertigo)     No 4. SEVERITY: "How bad is it?"  "Do you feel like you are going to faint?" "Can you  stand and walk?"   - MILD: Feels slightly dizzy, but walking normally.   - MODERATE: Feels unsteady when walking, but not falling; interferes with normal activities (e.g., school, work).   - SEVERE: Unable to walk without falling, or requires assistance to walk without falling; feels like passing out now.      Mild 5. ONSET:  "When did the dizziness begin?"     One week after tapering from prednisone 6. AGGRAVATING FACTORS: "Does anything make it worse?" (e.g., standing, change in head position)     Standing up 7. HEART RATE: "Can you tell me your heart rate?" "How many beats in 15 seconds?"  (Note: not all patients can do this)       Felt heart racing earlier, but not at the moment, patient stated that she has had issues with her heart rate in the past 8. CAUSE: "What do you think is causing the dizziness?"     Patient believes tapering off of prednisone is causing the issue 9. RECURRENT SYMPTOM: "Have you had dizziness before?" If Yes, ask: "When was the last time?" "What happened that time?"     Yes, patient stated "it's been awhile, July 2023 to be exact" 10. OTHER SYMPTOMS: "Do you have any other symptoms?" (e.g., fever, chest pain, vomiting, diarrhea, bleeding)       No, patient denies chest pain and shortness of breath  Protocols used: Dizziness - Lightheadedness-A-AH

## 2023-07-11 ENCOUNTER — Ambulatory Visit: Payer: BC Managed Care – PPO | Admitting: Internal Medicine

## 2023-07-12 ENCOUNTER — Ambulatory Visit (INDEPENDENT_AMBULATORY_CARE_PROVIDER_SITE_OTHER): Payer: BC Managed Care – PPO | Admitting: Family Medicine

## 2023-07-12 ENCOUNTER — Encounter: Payer: Self-pay | Admitting: Family Medicine

## 2023-07-12 ENCOUNTER — Telehealth: Payer: Self-pay | Admitting: Family Medicine

## 2023-07-12 VITALS — BP 135/85 | HR 94 | Temp 98.0°F | Ht 60.0 in | Wt 161.0 lb

## 2023-07-12 DIAGNOSIS — E039 Hypothyroidism, unspecified: Secondary | ICD-10-CM | POA: Diagnosis not present

## 2023-07-12 DIAGNOSIS — R7303 Prediabetes: Secondary | ICD-10-CM | POA: Diagnosis not present

## 2023-07-12 LAB — TSH: TSH: 0.5 u[IU]/mL (ref 0.35–5.50)

## 2023-07-12 LAB — HEMOGLOBIN A1C: Hgb A1c MFr Bld: 6.4 % (ref 4.6–6.5)

## 2023-07-12 NOTE — Patient Instructions (Addendum)
It was very nice to see you today!  We will check blood work today.  Your infection has cleared out.  Please let us know if your symptoms change.  Return if symptoms worsen or fail to improve.   Take care, Dr Jimmey Ralph  PLEASE NOTE:  If you had any lab tests, please let us know if you have not heard back within a few days. You may see your results on mychart before we have a chance to review them but we will give you a call once they are reviewed by [name].   If we ordered any referrals today, please let us know if you have not heard from their office within the next week.   If you had any urgent prescriptions sent in today, please check with the pharmacy within an hour of our visit to make sure the prescription was transmitted appropriately.   Please try these tips to maintain a healthy lifestyle:  Eat at least 3 REAL meals and 1-2 snacks per day.  Aim for no more than 5 hours between eating.  If you eat breakfast, please do so within one hour of getting up.   Each meal should contain half fruits/vegetables, one quarter protein, and one quarter carbs (no bigger than a computer mouse)  Cut down on sweet beverages. This includes juice, soda, and sweet tea.   Drink at least 1 glass of water with each meal and aim for at least 8 glasses per day  Exercise at least 150 minutes every week.

## 2023-07-12 NOTE — Telephone Encounter (Signed)
Pt requesting to come in for tetanus vaccine. Please advise if she is due for this .

## 2023-07-12 NOTE — Progress Notes (Signed)
   Danielle Bond is a 62 y.o. female who presents today for an office visit.  Assessment/Plan:  New/Acute Problems: Cough  Still has a bit of a lingering cough.  Reassuring exam today.  Likely has postinfectious cough.  Anticipate this will continue to improve over the next several weeks.  She will let us know if she has any further issues  Dizziness  No red flags.  Reassuring exam today.  May be related to her recent prednisone burst.  She did have labs done recently with GI which were normal.  Will also check A1c and TSH today.  Encouraged hydration.  She will let us know if symptoms do not gradually improve over the neck several days.  We discussed reasons to return to care and seek emergent care.  Chronic Problems Addressed Today: Hypothyroidism s/p total thyroidectomy 1997 On Synthroid 100 mcg daily.  She is worried about the levels being off since being on prednisone burst recently.  Will check TSH.  Prediabetes Home readings have been at goal.  Will check A1c today.     Subjective:  HPI:  See A/P for status of chronic conditions.  Patient is here today for follow-up.  We saw her a month ago for strep pharyngitis.  Gave her a prescription for azithromycin.  Her sore throat started to improve however developed additional symptoms including cough and congestion. She went to urgent care.  She was given prednisone for this.  Her chest congestion and cough persisted.  She contacted pulmonology and they ordered a chest x-ray.  This was normal.  She finished her course of prednisone.  Her cough and congestion have mostly resolved.  Still having occasional cough.  No more sore throat.  Since then she has been feeling more lightheaded and dizzy for the last few weeks. She has had this before after stopping her prednisone however never for this long. No shortness of breath. No wheezing.  She is worried that her thyroid or blood sugar levels may be off.  She has been checking her blood sugar at  home and readings have been in the 90-100 range but did get some readings into the 120s last week. She has been trying to stay well hydrated.        Objective:  Physical Exam: BP 135/85   Pulse 94   Temp 98 F (36.7 C) (Temporal)   Ht 5' (1.524 m)   Wt 161 lb (73 kg)   LMP 03/03/2013   SpO2 96%   BMI 31.44 kg/m   Gen: No acute distress, resting comfortably CV: Regular rate and rhythm with no murmurs appreciated Pulm: Normal work of breathing, clear to auscultation bilaterally with no crackles, wheezes, or rhonchi Neuro: Grossly normal, moves all extremities Psych: Normal affect and thought content      Tajay Muzzy M. Jimmey Ralph, MD 07/12/2023 12:06 PM

## 2023-07-12 NOTE — Assessment & Plan Note (Signed)
On Synthroid 100 mcg daily.  She is worried about the levels being off since being on prednisone burst recently.  Will check TSH.

## 2023-07-12 NOTE — Assessment & Plan Note (Signed)
Home readings have been at goal.  Will check A1c today.

## 2023-07-15 NOTE — Telephone Encounter (Signed)
Please schedule a nurse visit for TDAP  Last given on 07/22/2012

## 2023-07-16 NOTE — Progress Notes (Signed)
Her A1c is in the borderline diabetic range and higher than it has been. This is probably due to her recent steroid course. Do not need to start medications for her sugar but I would like for her to come back in 3-6 months to recheck.

## 2023-07-18 ENCOUNTER — Ambulatory Visit: Payer: BC Managed Care – PPO

## 2023-07-19 ENCOUNTER — Other Ambulatory Visit (HOSPITAL_BASED_OUTPATIENT_CLINIC_OR_DEPARTMENT_OTHER): Payer: Self-pay | Admitting: Obstetrics & Gynecology

## 2023-07-19 ENCOUNTER — Telehealth: Payer: Self-pay | Admitting: *Deleted

## 2023-07-19 DIAGNOSIS — B009 Herpesviral infection, unspecified: Secondary | ICD-10-CM

## 2023-07-19 NOTE — Telephone Encounter (Signed)
Copied from CRM [id number]. Topic: Clinical - Lab/Test Results >> Jul 19, 2023  1:01 PM [place] wrote: Reason for CRM: Pt would like someone to contact her to go over labs that she recently had done. I asked if she wanted me to relay the message from Dr Jimmey Ralph and she told me no, would rather have a nurse or Dr Jimmey Ralph call her and go over it.    See results note, Katria Botts,RMA

## 2023-07-23 ENCOUNTER — Ambulatory Visit: Payer: BC Managed Care – PPO

## 2023-07-26 ENCOUNTER — Other Ambulatory Visit: Payer: BC Managed Care – PPO

## 2023-07-29 DIAGNOSIS — M9904 Segmental and somatic dysfunction of sacral region: Secondary | ICD-10-CM | POA: Diagnosis not present

## 2023-07-29 DIAGNOSIS — M9903 Segmental and somatic dysfunction of lumbar region: Secondary | ICD-10-CM | POA: Diagnosis not present

## 2023-07-29 DIAGNOSIS — M5136 Other intervertebral disc degeneration, lumbar region with discogenic back pain only: Secondary | ICD-10-CM | POA: Diagnosis not present

## 2023-07-29 DIAGNOSIS — M9905 Segmental and somatic dysfunction of pelvic region: Secondary | ICD-10-CM | POA: Diagnosis not present

## 2023-08-09 ENCOUNTER — Ambulatory Visit: Payer: BC Managed Care – PPO | Admitting: Cardiology

## 2023-08-14 DIAGNOSIS — M9904 Segmental and somatic dysfunction of sacral region: Secondary | ICD-10-CM | POA: Diagnosis not present

## 2023-08-14 DIAGNOSIS — M9905 Segmental and somatic dysfunction of pelvic region: Secondary | ICD-10-CM | POA: Diagnosis not present

## 2023-08-14 DIAGNOSIS — M9903 Segmental and somatic dysfunction of lumbar region: Secondary | ICD-10-CM | POA: Diagnosis not present

## 2023-08-14 DIAGNOSIS — M5136 Other intervertebral disc degeneration, lumbar region with discogenic back pain only: Secondary | ICD-10-CM | POA: Diagnosis not present

## 2023-08-19 ENCOUNTER — Ambulatory Visit: Payer: Self-pay | Admitting: Family Medicine

## 2023-08-19 ENCOUNTER — Ambulatory Visit (INDEPENDENT_AMBULATORY_CARE_PROVIDER_SITE_OTHER): Payer: BC Managed Care – PPO | Admitting: Podiatrist

## 2023-08-19 DIAGNOSIS — B351 Tinea unguium: Secondary | ICD-10-CM

## 2023-08-19 NOTE — Telephone Encounter (Signed)
  Chief Complaint: Rib Pain Symptoms: Pain on Inspiration Frequency: One Week Ago Pertinent Negatives: Patient denies chest pain, dyspnea, bruising Disposition: [] ED /[] Urgent Care (no appt availability in office) / [x] Appointment(In office/virtual)/ []  [facility]/ [] Home Care/ [] Refused Recommended Disposition /[]  Mobile [facility]/ []  Follow-up with PCP Additional Notes: Danielle Bond is a 63 year old female being triaged for rib pain after a fall on the right side. Patient denies chest pain or dyspnea. The patient denies taking ibuprofen or tylenol due to contraindications. Reports pain after a certain point of inspiration.   Reason for Disposition  [1] MODERATE pain (e.g., interferes with normal activities) AND [2] present > 3 days  Answer Assessment - Initial Assessment Questions 1. ONSET: "When did the muscle aches or body pains start?"      One Week Ago  2. LOCATION: "What part of your body is hurting?" (e.g., entire body, arms, legs)      Right Upper Rib Area  3. SEVERITY: "How bad is the pain?" (Scale 1-10; or mild, moderate, severe)   - MILD (1-3): doesn't interfere with normal activities    - MODERATE (4-7): interferes with normal activities or awakens from sleep    - SEVERE (8-10):  excruciating pain, unable to do any normal activities      6  4. CAUSE: "What do you think is causing the pains?"     Recent Fall  5. FEVER: "Have you been having fever?"     No  6. OTHER SYMPTOMS: "Do you have any other symptoms?" (e.g., chest pain, weakness, rash, cold or flu symptoms, weight loss)     No  7. PREGNANCY: "Is there any chance you are pregnant?" "When was your last menstrual period?"     No  8. TRAVEL: "Have you traveled out of the country in the last month?" (e.g., travel history, exposures)     No  Protocols used: Muscle Aches and Body Pain-A-AH

## 2023-08-19 NOTE — Progress Notes (Signed)
Patient presents today for the 6th laser treatment. Diagnosed with mycotic nail infection by Dr. Logan Bores.   Toenail most affected Right hallux , third toe right and 5th toenail right.  All other systems are negative.  Due to past medical history she is unable to take lamisil   Nails were filed thin. Laser therapy was administered to 1-5 toenails right foot and patient tolerated the treatment well. All safety precautions were in place.   Single laser pass was done on non-affected nails.  She is using a homeopathic topical on her toenails which also appears to be helping the moisture and appearance of the nails.    Follow up in 6 weeks for laser # 7

## 2023-08-20 ENCOUNTER — Encounter: Payer: Self-pay | Admitting: Gastroenterology

## 2023-08-20 DIAGNOSIS — M9904 Segmental and somatic dysfunction of sacral region: Secondary | ICD-10-CM | POA: Diagnosis not present

## 2023-08-20 DIAGNOSIS — M5136 Other intervertebral disc degeneration, lumbar region with discogenic back pain only: Secondary | ICD-10-CM | POA: Diagnosis not present

## 2023-08-20 DIAGNOSIS — M9903 Segmental and somatic dysfunction of lumbar region: Secondary | ICD-10-CM | POA: Diagnosis not present

## 2023-08-20 DIAGNOSIS — M9905 Segmental and somatic dysfunction of pelvic region: Secondary | ICD-10-CM | POA: Diagnosis not present

## 2023-08-22 ENCOUNTER — Encounter: Payer: Self-pay | Admitting: Family Medicine

## 2023-08-22 ENCOUNTER — Ambulatory Visit (INDEPENDENT_AMBULATORY_CARE_PROVIDER_SITE_OTHER): Payer: BC Managed Care – PPO | Admitting: Family Medicine

## 2023-08-22 VITALS — BP 112/80 | HR 98 | Temp 97.3°F | Ht 60.0 in | Wt 155.0 lb

## 2023-08-22 DIAGNOSIS — H9209 Otalgia, unspecified ear: Secondary | ICD-10-CM | POA: Diagnosis not present

## 2023-08-22 DIAGNOSIS — R053 Chronic cough: Secondary | ICD-10-CM | POA: Diagnosis not present

## 2023-08-22 DIAGNOSIS — H6993 Unspecified Eustachian tube disorder, bilateral: Secondary | ICD-10-CM | POA: Diagnosis not present

## 2023-08-22 DIAGNOSIS — R0781 Pleurodynia: Secondary | ICD-10-CM | POA: Diagnosis not present

## 2023-08-22 MED ORDER — LIDOCAINE 5 % EX PTCH: 1.0000 | MEDICATED_PATCH | CUTANEOUS | 0 refills | Status: DC

## 2023-08-22 MED ORDER — OFLOXACIN 0.3 % OT SOLN: 5.0000 [drp] | Freq: Every day | OTIC | 0 refills | Status: DC

## 2023-08-22 NOTE — Patient Instructions (Signed)
It was very nice to see you today!  You probably have a rib fracture.  It also may be a severe contusion.  This should improve the next several weeks.  Please use the lidocaine.  He can use over-the-counter meds as needed as well.  I will send in eardrops for your ear irritation.  Please let us know if your symptoms do not improve.  No follow-ups on file.   Take care, Dr Jimmey Ralph  PLEASE NOTE:  If you had any lab tests, please let us know if you have not heard back within a few days. You may see your results on mychart before we have a chance to review them but we will give you a call once they are reviewed by [name].   If we ordered any referrals today, please let us know if you have not heard from their office within the next week.   If you had any urgent prescriptions sent in today, please check with the pharmacy within an hour of our visit to make sure the prescription was transmitted appropriately.   Please try these tips to maintain a healthy lifestyle:  Eat at least 3 REAL meals and 1-2 snacks per day.  Aim for no more than 5 hours between eating.  If you eat breakfast, please do so within one hour of getting up.   Each meal should contain half fruits/vegetables, one quarter protein, and one quarter carbs (no bigger than a computer mouse)  Cut down on sweet beverages. This includes juice, soda, and sweet tea.   Drink at least 1 glass of water with each meal and aim for at least 8 glasses per day  Exercise at least 150 minutes every week.  (=((===(===========================================================================

## 2023-08-22 NOTE — Progress Notes (Signed)
   Danielle Bond is a 63 y.o. female who presents today for an office visit.  Assessment/Plan:  New/Acute Problems: Rib Pain Likely does have a rib fracture or contusion related to her fall on ice a couple of weeks ago.  Does not have any other red flag signs or symptoms.  We did discuss obtaining x-ray however this would not significantly impact her management at this point and we will hold off on this for now.  Primary goal at this point is pain control.  We will send a prescription in for lidocaine patches.  She can use over-the-counter meds as needed as well.  We did discuss small supply of codeine however she declined as overall symptoms are fairly manageable.  Anticipate this will improve over the next several weeks.  She will let us know if symptoms fail to improve or if she has any worsening symptoms.  Otalgia  Exam consistent with otitis externa.  She has had this previously.  She has done well with ofloxacin drops.  Will send this back in for her.  If this does not improve she will need to go back to ENT.  We discussed reasons to return to care and seek emergent care.   Chronic Problems Addressed Today: Chronic cough This is exacerbating her above rib pain.  We did discuss having a cough medication with codeine however she declined.  She will continue taking Delsym and other over-the-counter meds as needed.  She also has albuterol to use as needed.  Eustachian tube dysfunction Very faint middle ear effusion today.  Do not think this is causing significant issues at this point however if she continues to have otalgia she will need to follow-up with ENT as above.     Subjective:  HPI:  Patient here with rib pain. This happened 2 days ago after slipping on ice. [place] Seat very on right side. Pain worse with coughing or sneezing.  Pain is stable the last several days.  She has used heat to the area which has helped.  Biofreeze has helped as well.  She does not notice any bruising to the  area.  She still has ongoing pain in her right ear.  This has been going on for several weeks.  She has seen ENT for this in the past but has not seen them recently.  In the past has gotten a prescription for ofloxacin which has helped.       Objective:  Physical Exam: BP 112/80   Pulse 98   Temp (!) 97.3 F (36.3 C) (Temporal)   Ht 5' (1.524 m)   Wt 155 lb (70.3 kg)   LMP 03/03/2013   SpO2 97%   BMI 30.27 kg/m   Gen: No acute distress, resting comfortably HEENT: Left TM and EAC clear.  Right EAC with erythema and honey crusted appearing exudates. CV: Regular rate and rhythm with no murmurs appreciated Pulm: Normal work of breathing, clear to auscultation bilaterally with no crackles, wheezes, or rhonchi MUSCULOSKELETAL: Chest wall without obvious ecchymoses or deformities.  Tender to palpation along lower rib margin. Neuro: Grossly normal, moves all extremities Psych: Normal affect and thought content      Alastor Kneale M. Jimmey Ralph, MD 08/22/2023 10:25 AM

## 2023-08-22 NOTE — Assessment & Plan Note (Signed)
This is exacerbating her above rib pain.  We did discuss having a cough medication with codeine however she declined.  She will continue taking Delsym and other over-the-counter meds as needed.  She also has albuterol to use as needed.

## 2023-08-22 NOTE — Assessment & Plan Note (Signed)
Very faint middle ear effusion today.  Do not think this is causing significant issues at this point however if she continues to have otalgia she will need to follow-up with ENT as above.

## 2023-08-23 ENCOUNTER — Telehealth: Payer: Self-pay

## 2023-08-23 ENCOUNTER — Other Ambulatory Visit (HOSPITAL_COMMUNITY): Payer: Self-pay

## 2023-08-23 NOTE — Telephone Encounter (Signed)
Pharmacy Patient Advocate Encounter   Received notification from  [facility] Portal that prior authorization for Lidocaine 5% patches is required/requested.   Insurance verification completed.   The patient is insured through [employer] .   Per test claim: PA required; PA submitted to above mentioned insurance via CoverMyMeds Key/confirmation #/EOC  [id number] Status is pending

## 2023-08-27 ENCOUNTER — Telehealth: Payer: Self-pay | Admitting: *Deleted

## 2023-08-27 NOTE — Telephone Encounter (Signed)
 Copied from CRM [id number]. Topic: Clinical - Medical Advice >> Aug 26, 2023  4:08 PM [place] C wrote: Reason for CRM: pt stated that she saw Dr. Kennyth last week and he recommended a patch over her ribs for her pain. However, the pharmacy stated they couldn't provide her with it because they needed a prior auth. She just wanted to let the clinic know that she decided to go with over the counter version because it was only a 1% difference from the prescription.  Please advise.    Please start PA thanks

## 2023-09-02 NOTE — Telephone Encounter (Signed)
 Pharmacy Patient Advocate Encounter  Received notification from [facility] that Prior Authorization for Lidocaine  5% patches has been DENIED.  See denial reason below. No denial letter attached in CMM. Will attach denial letter to Media tab once received.   PA #/Case ID/Reference #: [id number]

## 2023-09-03 ENCOUNTER — Telehealth: Payer: Self-pay | Admitting: Gastroenterology

## 2023-09-03 NOTE — Telephone Encounter (Signed)
Patient notified  Stated using Ice hot and is working, pharmacy recommended. No pian or discomfort at this time

## 2023-09-03 NOTE — Telephone Encounter (Signed)
Called patient to discuss if she wanted to schedule with Dr. Adela Lank for May.

## 2023-09-04 ENCOUNTER — Other Ambulatory Visit (HOSPITAL_COMMUNITY): Payer: Self-pay

## 2023-09-04 ENCOUNTER — Ambulatory Visit: Payer: BC Managed Care – PPO | Attending: Cardiology | Admitting: Cardiology

## 2023-09-04 ENCOUNTER — Encounter: Payer: Self-pay | Admitting: Cardiology

## 2023-09-04 ENCOUNTER — Telehealth: Payer: Self-pay | Admitting: Cardiology

## 2023-09-04 ENCOUNTER — Telehealth: Payer: Self-pay | Admitting: Pharmacy Technician

## 2023-09-04 VITALS — BP 140/80 | HR 112 | Resp 16 | Ht 60.0 in | Wt 160.0 lb

## 2023-09-04 DIAGNOSIS — I700 Atherosclerosis of aorta: Secondary | ICD-10-CM

## 2023-09-04 DIAGNOSIS — E782 Mixed hyperlipidemia: Secondary | ICD-10-CM | POA: Diagnosis not present

## 2023-09-04 DIAGNOSIS — K921 Melena: Secondary | ICD-10-CM

## 2023-09-04 DIAGNOSIS — K51019 Ulcerative (chronic) pancolitis with unspecified complications: Secondary | ICD-10-CM

## 2023-09-04 MED ORDER — EZETIMIBE 10 MG PO TABS: 10.0000 mg | ORAL_TABLET | Freq: Every day | ORAL | 3 refills | Status: DC

## 2023-09-04 MED ORDER — METOPROLOL SUCCINATE ER 50 MG PO TB24: 50.0000 mg | ORAL_TABLET | Freq: Every day | ORAL | 2 refills | Status: DC

## 2023-09-04 NOTE — Telephone Encounter (Signed)
Pt feels her office visit today was too fast and she's not comfortable with being on a new medication after not having her blood work done after 6 months. She'd like to discuss further once called back. Please advise.

## 2023-09-04 NOTE — Telephone Encounter (Signed)
Noted, thanks!

## 2023-09-04 NOTE — Patient Instructions (Signed)
Medication Instructions:  INCREASED Metoprolol from 25 MG to 50 MG.   START Zetia 10 mg take one tablet by mouth daily   *If you need a refill on your cardiac medications before your next appointment, please call your pharmacy*   Lab Work: Lipid Panel in 3 months   If you have labs (blood work) drawn today and your tests are completely normal, you will receive your results only by: MyChart Message (if you have MyChart) OR A paper copy in the mail If you have any lab test that is abnormal or we need to change your treatment, we will call you to review the results.  Follow-Up: At [facility], you and your health needs are our priority.  As part of our continuing mission to provide you with exceptional heart care, we have created designated Provider Care Teams.  These Care Teams include your primary Cardiologist (physician) and Advanced Practice Providers (APPs -  Physician Assistants and Nurse Practitioners) who all work together to provide you with the care you need, when you need it.  We recommend signing up for the patient portal called "MyChart".  Sign up information is provided on this After Visit Summary.  MyChart is used to connect with patients for Virtual Visits (Telemedicine).  Patients are able to view lab/test results, encounter notes, upcoming appointments, etc.  Non-urgent messages can be sent to your provider as well.   To learn more about what you can do with MyChart, go to [email or web address].    Your next appointment:   6 month(s)  Provider:   Elder Negus, MD     Other Instructions   1st Floor: - Lobby - Registration  - Pharmacy  - Lab - Cafe  2nd Floor: - PV Lab - Diagnostic Testing (echo, CT, nuclear med)  3rd Floor: - Vacant  4th Floor: - TCTS (cardiothoracic surgery) - AFib Clinic - Structural Heart Clinic - Vascular Surgery  - Vascular Ultrasound  5th Floor: - HeartCare Cardiology (general and EP) - Clinical Pharmacy for  coumadin, hypertension, lipid, weight-loss medications, and med management appointments    Valet parking services will be available as well.

## 2023-09-04 NOTE — Progress Notes (Signed)
  Cardiology Office Note:  .   Date:  09/04/2023  ID:  Danielle Bond, DOB August 20, 1960, MRN [id number] PCP: Ardith Dark, MD  [facility] Prov[facility] Cardiologist:  Truett Mainland, MD PCP: Ardith Dark, MD  Chief Complaint  Patient presents with   Mixed hyperlipidemia      History of Present Illness: .    Danielle Bond is a 63 y.o. female with hyperlipidemia, aortic and coronary atherrosclerosis, presyncope, Graves' disease, s/p thyroidectomy, ulcerative colitis, h/o acute pancreatitis, migraine  Patient denies any chest pain, shortness of breath.  She still has occasional palpitations, only last for few seconds.  Blood pressure and heart rate generally lower than what they are today in the office.  Reviewed recent lipid panel results with the patient, details below.  Vitals:   09/04/23 1303  BP: (!) 140/80  Pulse: (!) 112  Resp: 16  SpO2: 98%     ROS:  Review of Systems  Cardiovascular:  Positive for palpitations. Negative for chest pain, dyspnea on exertion, leg swelling and syncope.     Studies Reviewed: Marland Kitchen        EKG 09/04/2023: Normal sinus rhythm 96 bpm Nonspecific T wave abnormality When compared with ECG of 21-Aug-2021 19:39, No significant change was found    Independently interpreted 06/2023: Chol 210, TG 110, HDL 82, LDL 106 HbA1C 6.4% Hb 12.7 Cr 0.79   Physical Exam:   Physical Exam Vitals and nursing note reviewed.  Constitutional:      General: She is not in acute distress. Neck:     Vascular: No JVD.  Cardiovascular:     Rate and Rhythm: Normal rate and regular rhythm.     Heart sounds: Normal heart sounds. No murmur heard. Pulmonary:     Effort: Pulmonary effort is normal.     Breath sounds: Normal breath sounds. No wheezing or rales.  Musculoskeletal:     Right lower leg: No edema.     Left lower leg: No edema.      VISIT DIAGNOSES:   ICD-10-CM   1. Aortic atherosclerosis (HCC)  I70.0 EKG 12-Lead    2. Mixed  hyperlipidemia  E78.2 Lipid panel       ASSESSMENT AND PLAN: .    Danielle Bond is a 63 y.o. female with hyperlipidemia, aortic and coronary atherrosclerosis, presyncope, Graves' disease, s/p thyroidectomy, ulcerative colitis, h/o acute pancreatitis, migraine   Palpitations: Previously reassuring monitor in 06/2022. Increase metoprolol succinate to 50 mg daily. That should also help with blood pressure, which is generally controlled, but elevated today.    Hyperlipidemia: Chol 210, TG 110, HDL 82, LDL 106 (03/2023) on Crestor 10 mg daily. She has not tolerated higher dose of Crestor due to nausea. Given her calcium score of 6, and known aortic atherosclerosis, added Zetia 10 mg daily. Lipid panel in 3 months..  Aorta and coronary atherosclerosis: Minimal. Continue risk factor modification, especially lipid control. Not on Aspirin due to h/o GI bleeding with ulcerative colitis.          No orders of the defined types were placed in this encounter.    F/u in 6 months  Signed, Elder Negus, MD

## 2023-09-04 NOTE — Telephone Encounter (Signed)
Pt is calling in because she forgot to mention to Dr. Rosemary Holms that when she had her last lipids checked with PCP, she had been off of her statin due to an extended hospital admission with colitis.    She said PCP did not want to change anything with her statin dosing at that time, and said he would reassess her numbers when she comes in for her Physical in March 2025, and go from there.   Pt states she will hold off on the zetia Dr. Rosemary Holms started her on today and when she has her lipids rechecked in March with PCP, she will have these results sent to Dr. Rosemary Holms to advise if she needs to proceed with starting the zetia or not.  Pt did confirm that she has not missed any doses of her crestor, since her last lipid check in September.  Shared the plan with Dr. Rosemary Holms and he agrees with this.   Pt is aware that Dr. Rosemary Holms agrees with this, and we will await her lipid results when she gets this checked by PCP in March.  Pt verbalized understanding and agrees with this plan.

## 2023-09-09 NOTE — Telephone Encounter (Signed)
Inbound call from [facility], stating Rinvoq does need PA.   [phone] Reference case number [id number]

## 2023-09-12 ENCOUNTER — Telehealth: Payer: Self-pay | Admitting: Gastroenterology

## 2023-09-12 DIAGNOSIS — K51019 Ulcerative (chronic) pancolitis with unspecified complications: Secondary | ICD-10-CM

## 2023-09-12 DIAGNOSIS — Z8619 Personal history of other infectious and parasitic diseases: Secondary | ICD-10-CM

## 2023-09-12 DIAGNOSIS — Z79899 Other long term (current) drug therapy: Secondary | ICD-10-CM

## 2023-09-12 NOTE — Telephone Encounter (Signed)
Returned call to patient. Pt reports doing well on Rinvoq 30 mg overall. 2 days ago patient started having diarrhea and blood in the stool. Pt states that the blood in the stool , and it is not the type of blood that comes from straining. Pt describes stool as "grainy, soft diarrhea", reports about 3 stools a day. Pt has not been around anyone that has been sick, no flu or COVID. Pt reports pain in her left intestine, feels like "wringing a towel out". Pt is eating a regular diet. Pt denies any nausea, vomiting, no fever, or chills. Pt does not know if it is a colitis flare or if it is C. Diff again. Pt states that she has been doing well on Rinvoq. I advised that you may order stool studies. Please advise, thanks.

## 2023-09-12 NOTE — Telephone Encounter (Signed)
Inbound call from patient, would like to discuss stool with a nurse, she states she feels like her left side intestines are tightening, she also states she is noticing something "weird" in her stools for the past two days and is also noticing some rectal bleeding.

## 2023-09-12 NOTE — Telephone Encounter (Signed)
Called and spoke with patient regarding recommendations. Pt has been advised to stop by the lab in the basement at he convenience to pick up stool kits. No appt needed, recommendations to follow results. Pt verbalized understanding.  Stool study orders in epic.

## 2023-09-12 NOTE — Telephone Encounter (Signed)
Thanks [place] - I think we should do a fecal calprotectin and C Diff PCR - can you order these if she can submit at her convenience, and make a decision based on the results. Thanks

## 2023-09-16 ENCOUNTER — Other Ambulatory Visit: Payer: BC Managed Care – PPO

## 2023-09-17 ENCOUNTER — Other Ambulatory Visit: Payer: BC Managed Care – PPO

## 2023-09-17 DIAGNOSIS — K51019 Ulcerative (chronic) pancolitis with unspecified complications: Secondary | ICD-10-CM

## 2023-09-17 DIAGNOSIS — Z8619 Personal history of other infectious and parasitic diseases: Secondary | ICD-10-CM | POA: Diagnosis not present

## 2023-09-17 DIAGNOSIS — Z79899 Other long term (current) drug therapy: Secondary | ICD-10-CM | POA: Diagnosis not present

## 2023-09-19 LAB — CALPROTECTIN, FECAL: Calprotectin, Fecal: 5 ug/g (ref 0–120)

## 2023-09-20 NOTE — Telephone Encounter (Signed)
 Inbound call from patient, states [facility] is advising her that they have not received any PA for Rinvoq. Would like to know when this can be done.

## 2023-09-24 ENCOUNTER — Encounter: Payer: Self-pay | Admitting: Family Medicine

## 2023-09-24 ENCOUNTER — Ambulatory Visit (INDEPENDENT_AMBULATORY_CARE_PROVIDER_SITE_OTHER): Payer: BC Managed Care – PPO | Admitting: Family Medicine

## 2023-09-24 ENCOUNTER — Encounter: Payer: BC Managed Care – PPO | Admitting: Family Medicine

## 2023-09-24 VITALS — BP 130/83 | HR 90 | Temp 97.2°F | Ht 60.0 in | Wt 159.0 lb

## 2023-09-24 DIAGNOSIS — K51019 Ulcerative (chronic) pancolitis with unspecified complications: Secondary | ICD-10-CM | POA: Diagnosis not present

## 2023-09-24 DIAGNOSIS — Z23 Encounter for immunization: Secondary | ICD-10-CM

## 2023-09-24 DIAGNOSIS — E039 Hypothyroidism, unspecified: Secondary | ICD-10-CM

## 2023-09-24 DIAGNOSIS — E785 Hyperlipidemia, unspecified: Secondary | ICD-10-CM

## 2023-09-24 DIAGNOSIS — Z0000 Encounter for general adult medical examination without abnormal findings: Secondary | ICD-10-CM

## 2023-09-24 DIAGNOSIS — R7303 Prediabetes: Secondary | ICD-10-CM | POA: Diagnosis not present

## 2023-09-24 DIAGNOSIS — M2550 Pain in unspecified joint: Secondary | ICD-10-CM

## 2023-09-24 DIAGNOSIS — Z0001 Encounter for general adult medical examination with abnormal findings: Secondary | ICD-10-CM

## 2023-09-24 LAB — VITAMIN D 25 HYDROXY (VIT D DEFICIENCY, FRACTURES): VITD: 51.87 ng/mL (ref 30.00–100.00)

## 2023-09-24 LAB — CBC
HCT: 37.7 % (ref 36.0–46.0)
Hemoglobin: 12.7 g/dL (ref 12.0–15.0)
MCHC: 33.6 g/dL (ref 30.0–36.0)
MCV: 91.4 fl (ref 78.0–100.0)
Platelets: 306 10*3/uL (ref 150.0–400.0)
RBC: 4.12 Mil/uL (ref 3.87–5.11)
RDW: 13.3 % (ref 11.5–15.5)
WBC: 6.1 10*3/uL (ref 4.0–10.5)

## 2023-09-24 LAB — COMPREHENSIVE METABOLIC PANEL WITH GFR
ALT: 22 U/L (ref 0–35)
AST: 33 U/L (ref 0–37)
Albumin: 4.7 g/dL (ref 3.5–5.2)
Alkaline Phosphatase: 64 U/L (ref 39–117)
BUN: 12 mg/dL (ref 6–23)
CO2: 28 meq/L (ref 19–32)
Calcium: 9.7 mg/dL (ref 8.4–10.5)
Chloride: 102 meq/L (ref 96–112)
Creatinine, Ser: 0.78 mg/dL (ref 0.40–1.20)
GFR: 81.14 mL/min
Glucose, Bld: 129 mg/dL — ABNORMAL HIGH (ref 70–99)
Potassium: 4.4 meq/L (ref 3.5–5.1)
Sodium: 139 meq/L (ref 135–145)
Total Bilirubin: 0.7 mg/dL (ref 0.2–1.2)
Total Protein: 7.7 g/dL (ref 6.0–8.3)

## 2023-09-24 LAB — URINALYSIS, ROUTINE W REFLEX MICROSCOPIC
Bilirubin Urine: NEGATIVE
Hgb urine dipstick: NEGATIVE
Ketones, ur: NEGATIVE
Leukocytes,Ua: NEGATIVE
Nitrite: NEGATIVE
RBC / HPF: NONE SEEN (ref 0–?)
Specific Gravity, Urine: <=1.005 — AB (ref 1.000–1.030)
Total Protein, Urine: NEGATIVE
Urine Glucose: NEGATIVE
Urobilinogen, UA: 0.2 (ref 0.0–1.0)
pH: 6 (ref 5.0–8.0)

## 2023-09-24 LAB — LIPID PANEL
Cholesterol: 207 mg/dL — ABNORMAL HIGH (ref 0–200)
HDL: 100.2 mg/dL
LDL Cholesterol: 90 mg/dL (ref 0–99)
NonHDL: 106.3
Total CHOL/HDL Ratio: 2
Triglycerides: 80 mg/dL (ref 0.0–149.0)
VLDL: 16 mg/dL (ref 0.0–40.0)

## 2023-09-24 LAB — TSH: TSH: 0.58 u[IU]/mL (ref 0.35–5.50)

## 2023-09-24 LAB — HEMOGLOBIN A1C: Hgb A1c MFr Bld: 6.4 % (ref 4.6–6.5)

## 2023-09-24 LAB — MAGNESIUM: Magnesium: 2.1 mg/dL (ref 1.5–2.5)

## 2023-09-24 LAB — VITAMIN B12: Vitamin B-12: 897 pg/mL (ref 211–911)

## 2023-09-24 NOTE — Assessment & Plan Note (Signed)
 Last A1c 6 4.  Check A1c today.

## 2023-09-24 NOTE — Assessment & Plan Note (Signed)
 Continue management per GI.  She is on rinvoq 30 mg daily.

## 2023-09-24 NOTE — Patient Instructions (Addendum)
 It was very nice to see you today!  We will check blood work. We will give you the tetanus shot today.   Please keep working on diet and exercise.  Return in about 1 year (around 09/23/2024) for Annual Physical.   Take care, Dr Jimmey Ralph  PLEASE NOTE:  If you had any lab tests, please let us know if you have not heard back within a few days. You may see your results on mychart before we have a chance to review them but we will give you a call once they are reviewed by [name].   If we ordered any referrals today, please let us know if you have not heard from their office within the next week.   If you had any urgent prescriptions sent in today, please check with the pharmacy within an hour of our visit to make sure the prescription was transmitted appropriately.   Please try these tips to maintain a healthy lifestyle:  Eat at least 3 REAL meals and 1-2 snacks per day.  Aim for no more than 5 hours between eating.  If you eat breakfast, please do so within one hour of getting up.   Each meal should contain half fruits/vegetables, one quarter protein, and one quarter carbs (no bigger than a computer mouse)  Cut down on sweet beverages. This includes juice, soda, and sweet tea.   Drink at least 1 glass of water with each meal and aim for at least 8 glasses per day  Exercise at least 150 minutes every week.    Preventive Care 1-71 Years Old, Female Preventive care refers to lifestyle choices and visits with your health care provider that can promote health and wellness. Preventive care visits are also called wellness exams. What can I expect for my preventive care visit? Counseling Your health care provider may ask you questions about your: Medical history, including: Past medical problems. Family medical history. Pregnancy history. Current health, including: Menstrual cycle. Method of birth control. Emotional well-being. Home life and relationship well-being. Sexual activity and  sexual health. Lifestyle, including: Alcohol, nicotine or tobacco, and drug use. Access to firearms. Diet, exercise, and sleep habits. Work and work Astronomer. Sunscreen use. Safety issues such as seatbelt and bike helmet use. Physical exam Your health care provider will check your: Height and weight. These may be used to calculate your BMI (body mass index). BMI is a measurement that tells if you are at a healthy weight. Waist circumference. This measures the distance around your waistline. This measurement also tells if you are at a healthy weight and may help predict your risk of certain diseases, such as type 2 diabetes and high blood pressure. Heart rate and blood pressure. Body temperature. Skin for abnormal spots. What immunizations do I need?  Vaccines are usually given at various ages, according to a schedule. Your health care provider will recommend vaccines for you based on your age, medical history, and lifestyle or other factors, such as travel or where you work. What tests do I need? Screening Your health care provider may recommend screening tests for certain conditions. This may include: Lipid and cholesterol levels. Diabetes screening. This is done by checking your blood sugar (glucose) after you have not eaten for a while (fasting). Pelvic exam and Pap test. Hepatitis B test. Hepatitis C test. HIV (human immunodeficiency virus) test. STI (sexually transmitted infection) testing, if you are at risk. Lung cancer screening. Colorectal cancer screening. Mammogram. Talk with your health care provider about when you  should start having regular mammograms. This may depend on whether you have a family history of breast cancer. BRCA-related cancer screening. This may be done if you have a family history of breast, ovarian, tubal, or peritoneal cancers. Bone density scan. This is done to screen for osteoporosis. Talk with your health care provider about your test results,  treatment options, and if necessary, the need for more tests. Follow these instructions at home: Eating and drinking  Eat a diet that includes fresh fruits and vegetables, whole grains, lean protein, and low-fat dairy products. Take vitamin and mineral supplements as recommended by your health care provider. Do not drink alcohol if: Your health care provider tells you not to drink. You are pregnant, may be pregnant, or are planning to become pregnant. If you drink alcohol: Limit how much you have to 0-1 drink a day. Know how much alcohol is in your drink. In the [place] one drink equals one 12 oz bottle of beer (355 mL), one 5 oz glass of wine (148 mL), or one 1 oz glass of hard liquor (44 mL). Lifestyle Brush your teeth every morning and night with fluoride toothpaste. Floss one time each day. Exercise for at least 30 minutes 5 or more days each week. Do not use any products that contain nicotine or tobacco. These products include cigarettes, chewing tobacco, and vaping devices, such as e-cigarettes. If you need help quitting, ask your health care provider. Do not use drugs. If you are sexually active, practice safe sex. Use a condom or other form of protection to prevent STIs. If you do not wish to become pregnant, use a form of birth control. If you plan to become pregnant, see your health care provider for a prepregnancy visit. Take aspirin only as told by your health care provider. Make sure that you understand how much to take and what form to take. Work with your health care provider to find out whether it is safe and beneficial for you to take aspirin daily. Find healthy ways to manage stress, such as: Meditation, yoga, or listening to music. Journaling. Talking to a trusted person. Spending time with friends and family. Minimize exposure to UV radiation to reduce your risk of skin cancer. Safety Always wear your seat belt while driving or riding in a vehicle. Do not drive: If you  have been drinking alcohol. Do not ride with someone who has been drinking. When you are tired or distracted. While texting. If you have been using any mind-altering substances or drugs. Wear a helmet and other protective equipment during sports activities. If you have firearms in your house, make sure you follow all gun safety procedures. Seek help if you have been physically or sexually abused. What's next? Visit your health care provider once a year for an annual wellness visit. Ask your health care provider how often you should have your eyes and teeth checked. Stay up to date on all vaccines. This information is not intended to replace advice given to you by your health care provider. Make sure you discuss any questions you have with your health care provider. Document Revised: 01/04/2021 Document Reviewed: 01/04/2021 Elsevier Patient Education  2024 [employer].

## 2023-09-24 NOTE — Addendum Note (Signed)
 Addended by: Dyann Kief on: 09/24/2023 10:26 AM   Modules accepted: Orders

## 2023-09-24 NOTE — Assessment & Plan Note (Signed)
 Continue management per orthopedics.  She will be seeing them soon.

## 2023-09-24 NOTE — Assessment & Plan Note (Signed)
On Synthroid 100 mcg daily.  Check TSH. 

## 2023-09-24 NOTE — Assessment & Plan Note (Signed)
 Check labs.  We discussed lifestyle modifications.  She is on Crestor 10 mg daily per cardiology.

## 2023-09-24 NOTE — Progress Notes (Signed)
 Chief Complaint:  Danielle Bond is a 63 y.o. female who presents today for her annual comprehensive physical exam.    Assessment/Plan:  Chronic Problems Addressed Today: Dyslipidemia Check labs.  We discussed lifestyle modifications.  She is on Crestor 10 mg daily per cardiology.   Chronic ulcerative pancolitis (HCC) Continue management per GI.  She is on rinvoq 30 mg daily.  Hypothyroidism s/p total thyroidectomy 1997 On Synthroid 100 mcg daily.  Check TSH.  Prediabetes Last A1c 6 4.  Check A1c today.  Arthralgia Continue management per orthopedics.  She will be seeing them soon.   Preventative Healthcare: Check labs.  Follows with gynecology for women's health-will obtain records.  Tdap given today.  Up-to-date on other vaccines and screenings.  Patient Counseling(The following topics were reviewed and/or handout was given):  -Nutrition: Stressed importance of moderation in sodium/caffeine intake, saturated fat and cholesterol, caloric balance, sufficient intake of fresh fruits, vegetables, and fiber.  -Stressed the importance of regular exercise.   -Substance Abuse: Discussed cessation/primary prevention of tobacco, alcohol, or other drug use; driving or other dangerous activities under the influence; availability of treatment for abuse.   -Injury prevention: Discussed safety belts, safety helmets, smoke detector, smoking near bedding or upholstery.   -Sexuality: Discussed sexually transmitted diseases, partner selection, use of condoms, avoidance of unintended pregnancy and contraceptive alternatives.   -Dental health: Discussed importance of regular tooth brushing, flossing, and dental visits.  -Health maintenance and immunizations reviewed. Please refer to Health maintenance section.  Return to care in 1 year for next preventative visit.     Subjective:  HPI:  She has no acute complaints today. See Assessment / plan for status of chronic conditions.    Lifestyle Diet: Drinking a lot of water.  Exercise: Pilates. Does a lof of power walking.      09/24/2023    9:28 AM  Depression screen PHQ 2/9  Decreased Interest 0  Down, Depressed, Hopeless 0  PHQ - 2 Score 0    Health Maintenance Due  Topic Date Due   Cervical Cancer Screening (HPV/Pap Cotest)  10/22/2020   MAMMOGRAM  02/16/2023     ROS: Per HPI, otherwise a complete review of systems was negative.   PMH:  The following were reviewed and entered/updated in epic: Past Medical History:  Diagnosis Date   Asthma    Autoimmune hepatitis (HCC)    Chronic kidney disease    Fatty liver 2023   CT   Hx of pancreatitis 06/16/2018   Kidney stone    Kidney stone    Migraine    Orthostatic hypotension    Osteopenia 03/31/2012   Recurrent upper respiratory infection (URI)    Ulcerative colitis (HCC) 03/31/2012   Urticaria    Patient Active Problem List   Diagnosis Date Noted   Mixed hyperlipidemia 09/04/2023   Prediabetes 09/20/2022   Chronic cough 08/07/2022   Abnormal CT of the chest 08/07/2022   Chronic rhinitis 03/21/2022   Onychomycosis 01/30/2022   Arthralgia 07/19/2021   Immunosuppressed status (HCC) 07/19/2021   Eustachian tube dysfunction 06/13/2021   Osteoporosis 07/11/2020   Atherosclerosis of native coronary artery of native heart without angina pectoris 06/08/2019   Aortic atherosclerosis (HCC) 06/08/2019   Dyslipidemia 05/02/2019   Postural dizziness with presyncope 05/02/2019   HSV-2 (herpes simplex virus 2) infection 11/04/2018   Hypothyroidism s/p total thyroidectomy 1997 08/15/2018   Autoimmune hepatitis (HCC) 08/16/2012   Asthma 03/31/2012   Chronic ulcerative pancolitis (HCC) 03/31/2012   History  of renal calculi 03/31/2012   Migraines 03/31/2012   Mitral valve prolapse    Past Surgical History:  Procedure Laterality Date   BIOPSY  01/25/2022   Procedure: BIOPSY;  Surgeon: Kerin Salen, MD;  Location: [facility];  Service:  Gastroenterology;;   CESAREAN SECTION     x3   CHOLECYSTECTOMY     COLONOSCOPY     COLONOSCOPY N/A 01/25/2022   Procedure: COLONOSCOPY;  Surgeon: Kerin Salen, MD;  Location: [facility];  Service: Gastroenterology;  Laterality: N/A;   LITHOTRIPSY     THYROIDECTOMY     1997    Family History  Problem Relation Age of Onset   Heart disease Mother    Dementia Mother    Thyroid disease Mother    Osteoarthritis Mother    Asthma Mother    Heart attack Father    Heart attack Brother    Heart disease Brother    Colon cancer Maternal Aunt        dx 22s   Ovarian cancer Paternal Aunt 6   Prostate cancer Paternal Uncle        metastatic; dx 35s   Heart disease Paternal Grandmother    Crohn's disease Daughter 76   Breast cancer Cousin        paternal female cousin; dx 30s   Colon cancer Cousin 22       paternal female cousin   Stomach cancer Neg Hx    Rectal cancer Neg Hx    Esophageal cancer Neg Hx    Liver cancer Neg Hx    Pancreatic cancer Neg Hx     Medications- reviewed and updated Current Outpatient Medications  Medication Sig Dispense Refill   albuterol (PROVENTIL) (2.5 MG/3ML) 0.083% nebulizer solution Take 3 mLs (2.5 mg total) by nebulization every 6 (six) hours as needed for wheezing or shortness of breath. 150 mL 1   albuterol (VENTOLIN HFA) 108 (90 Base) MCG/ACT inhaler TAKE 2 PUFFS BY MOUTH EVERY 6 HOURS AS NEEDED FOR WHEEZE OR SHORTNESS OF BREATH 25.5 each 7   budesonide-formoterol (SYMBICORT) 160-4.5 MCG/ACT inhaler Inhale 1 puff into the lungs 2 (two) times daily.     cholecalciferol (VITAMIN D3) 25 MCG (1000 UNIT) tablet Take 2,000 Units by mouth daily.     Cholecalciferol 1.25 MG (50000 UT) capsule Take 1 capsule every week by oral route, for 8 weeks.     clotrimazole-betamethasone (LOTRISONE) cream Apply 1 Application topically daily. 45 g 2   [employer] VITAMIN B12 1000 MCG tablet Take 1,000 mcg by mouth daily.     EPINEPHrine 0.3 mg/0.3 mL IJ SOAJ injection Inject 0.3  mg into the muscle as needed for anaphylaxis. 2 each 1   ezetimibe (ZETIA) 10 MG tablet Take 1 tablet (10 mg total) by mouth daily. 90 tablet 3   folic acid (FOLVITE) 1 MG tablet TAKE 1 TABLET BY MOUTH EVERY DAY FOR 30 DAYS (Patient taking differently: Take 1 mg by mouth daily.) 90 tablet 1   Magnesium 500 MG TABS Take 500 mg by mouth daily.     metoprolol succinate (TOPROL-XL) 50 MG 24 hr tablet Take 1 tablet (50 mg total) by mouth daily. 90 tablet 2   Multiple Vitamin (MULTIVITAMIN WITH MINERALS) TABS tablet Take 1 tablet by mouth daily.     NON FORMULARY [place] Beet     ofloxacin (FLOXIN) 0.3 % OTIC solution Place 5 drops into both ears daily. 5 mL 0   Omega 3-6-9 Fatty Acids (OMEGA 3-6-9 PO) Take 1  tablet by mouth daily.     polyethylene glycol (MIRALAX) 17 g packet Use once or twice daily 14 each 0   RINVOQ 30 MG TB24 Take 1 tablet (30 mg total) by mouth daily. 90 tablet 3   rosuvastatin (CRESTOR) 10 MG tablet TAKE 1 TABLET BY MOUTH EVERY DAY 90 tablet 3   SYNTHROID 100 MCG tablet TAKE 1 TABLET BY MOUTH EVERY DAY IN THE MORNING 30 tablet 5   valACYclovir (VALTREX) 500 MG tablet 1 tab bid x 3 days for new symptoms 30 tablet 0   vitamin C (ASCORBIC ACID) 500 MG tablet Take 500 mg by mouth daily.     No current facility-administered medications for this visit.    Allergies-reviewed and updated Allergies  Allergen Reactions   Almond Oil Hives and Shortness Of Breath   Lialda [Mesalamine] Other (See Comments)    Vomiting and rectal bleeding   Other Anaphylaxis    Almonds, tree nuts   Aspirin Hives and Nausea Only    Stomach cramps   Hydromorphone Hcl Other (See Comments)    Caused Enzyme levels to go up   Penicillins Hives and Swelling    Has patient had a PCN reaction causing immediate rash, facial/tongue/throat swelling, SOB or lightheadedness with hypotension: Yes Has patient had a PCN reaction causing severe rash involving mucus membranes or skin necrosis: No Has patient had a PCN  reaction that required hospitalization: No Has patient had a PCN reaction occurring within the last 10 years: No  Stomach cramps If all of the above answers are "NO", then may proceed with Cephalosporin use.    Acetaminophen Other (See Comments)    Has autoimmune hepatitis, wants to avoid APAP   Apriso [Mesalamine Er] Nausea And Vomiting   Corticosteroids Other (See Comments)    Had acute pancreatitis x 2 after steroid use.   Imitrex [Sumatriptan] Swelling    Injection caused swelling   Stadol [Butorphanol] Other (See Comments)    hallucinates   Topamax [Topiramate] Other (See Comments)    Kidney stones   Vancomycin Hives   Azathioprine Rash   Pepcid [Famotidine] Nausea And Vomiting    Makes acid worse   Prednisone Nausea And Vomiting    All steroids causes nausea and acute pancreatitis     Social History   Socioeconomic History   Marital status: Married    Spouse name: Not on file   Number of children: 2   Years of education: some college   Highest education level: Not on file  Occupational History   Occupation: Self-employed  Tobacco Use   Smoking status: Never   Smokeless tobacco: Never  Vaping Use   Vaping status: Never Used  Substance and Sexual Activity   Alcohol use: No   Drug use: No   Sexual activity: Yes  Other Topics Concern   Not on file  Social History Narrative   Lives at home with her husband.   Right-handed.   No caffeine use.   Social Drivers of Corporate investment banker Strain: Not on file  Food Insecurity: Low Risk  (04/18/2023)   Received from [facility]   Hunger Vital Sign    Worried About Running Out of Food in the Last Year: Never true    Ran Out of Food in the Last Year: Never true  Transportation Needs: No Transportation Needs (04/18/2023)   Received from [facility]    In the past 12 months, has lack of reliable transportation kept  you from medical appointments, meetings, work or from getting things needed  for daily living? : No  Physical Activity: Not on file  Stress: Not on file  Social Connections: Unknown (12/03/2021)   Received from [facility]   Social Network    Social Network: Not on file        Objective:  Physical Exam: BP 130/83   Pulse 90   Temp (!) 97.2 F (36.2 C) (Temporal)   Ht 5' (1.524 m)   Wt 159 lb (72.1 kg)   LMP 03/03/2013   SpO2 97%   BMI 31.05 kg/m   Body mass index is 31.05 kg/m. Wt Readings from Last 3 Encounters:  09/24/23 159 lb (72.1 kg)  09/04/23 160 lb (72.6 kg)  08/22/23 155 lb (70.3 kg)   Gen: NAD, resting comfortably HEENT: TMs normal bilaterally. OP clear. No thyromegaly noted.  CV: RRR with no murmurs appreciated Pulm: NWOB, CTAB with no crackles, wheezes, or rhonchi GI: Normal bowel sounds present. Soft, Nontender, Nondistended. MSK: no edema, cyanosis, or clubbing noted Skin: warm, dry Neuro: CN2-12 grossly intact. Strength 5/5 in upper and lower extremities. Reflexes symmetric and intact bilaterally.  Psych: Normal affect and thought content     Danielle Bond M. Jimmey Ralph, MD 09/24/2023 10:16 AM

## 2023-09-25 ENCOUNTER — Ambulatory Visit: Admitting: Family

## 2023-09-25 NOTE — Progress Notes (Signed)
 Her blood sugars in the borderline diabetic range but the rest of her labs are all at goal.  Do not need to make any medication adjustments at this point however she should continue to work on diet and exercise.  We can see her back in 6 to 12 months to recheck her A1c.  We can recheck everything else in a year.

## 2023-09-26 ENCOUNTER — Other Ambulatory Visit (HOSPITAL_COMMUNITY): Payer: Self-pay

## 2023-09-26 NOTE — Telephone Encounter (Signed)
 Per my test claim medication is not requiring a prior auth. Insurance may only pay for a 30 day supply.

## 2023-09-30 ENCOUNTER — Telehealth: Payer: Self-pay | Admitting: Cardiology

## 2023-09-30 ENCOUNTER — Ambulatory Visit: Payer: Self-pay | Admitting: Family Medicine

## 2023-09-30 NOTE — Telephone Encounter (Signed)
 Copied from CRM [id number]. Topic: Clinical - Lab/Test Results >> Sep 30, 2023 12:01 PM [place] E wrote: Reason for CRM: Read patient her most recent lab work results, patient has further questions regarding her lipid panel.   Patient called regarding her lipid panel. I discussed the results with her and have answered her questions.She has no further questions or concerns at this time.    Reason for Disposition  [1] Other NON-URGENT information for PCP AND [2] does not require PCP response  Answer Assessment - Initial Assessment Questions 1. REASON FOR CALL or QUESTION: "What is your reason for calling today?" or "How can I best help you?" or "What question do you have that I can help answer?"     Calling for lab results  2. CALLER: Document the source of call. (e.g., laboratory, patient).     Patient  Protocols used: PCP Call - No Triage-A-AH

## 2023-09-30 NOTE — Telephone Encounter (Signed)
 Called pt and was advised that she had no further issues or concerns. Spoke with Danielle Bond and has understanding of lab results and no other concerns to discuss at this time. Nothing further needed.

## 2023-09-30 NOTE — Telephone Encounter (Signed)
 Patient calling to give Cholesterol number [phone]. And would like for the nurse to give her a call. Please advise

## 2023-09-30 NOTE — Telephone Encounter (Signed)
 Spoke with pt and just had lipid panel done on 09/24/23 at [facility] Please review values Per pt does not want to take more cholesterol meds Pt also notes is taking Rinvoq and this can cause fluctuating values Pt is following up with GI in the near future to discuss other options Will forward to Dr Rosemary Holms for review .Danielle Bond

## 2023-10-01 NOTE — Telephone Encounter (Signed)
 Cholesterol is better. Continue current dose.  Thanks MJP

## 2023-10-01 NOTE — Telephone Encounter (Signed)
 Dr. Rosemary Holms reviewed recent labs, shared his response with patient:  Cholesterol is better. Continue current dose.   Thanks MJP    Patient verbalized understanding and expressed appreciation for call.

## 2023-10-03 ENCOUNTER — Other Ambulatory Visit: Payer: Self-pay | Admitting: Cardiology

## 2023-10-04 ENCOUNTER — Other Ambulatory Visit (INDEPENDENT_AMBULATORY_CARE_PROVIDER_SITE_OTHER): Payer: Self-pay

## 2023-10-04 ENCOUNTER — Ambulatory Visit: Admitting: Family

## 2023-10-04 DIAGNOSIS — M25562 Pain in left knee: Secondary | ICD-10-CM

## 2023-10-04 NOTE — Progress Notes (Signed)
 Office Visit Note   Patient: Danielle Bond           Date of Birth: [birth year]           MRN: [id number] Visit Date: 10/04/2023              Requested by: Ardith Dark, MD [address] [place],  [place] [address] PCP: Ardith Dark, MD  No chief complaint on file.     HPI: The patient is a 63 year old woman who is seen today for evaluation of left knee pain.  She reports she fell in the snow several weeks ago and has been having worse medial knee pain since.  She has some tenderness some catching of the knee as well as giving way with weightbearing she cannot and identify any relieving factors  Assessment & Plan: Visit Diagnoses:  1. Acute pain of left knee     Plan: Discussed possibility of cartilage involvement.  Her radiographs are unremarkable.  She declined a Depo-Medrol injection today would like to proceed with conservative measures anti-inflammatories by mouth and home exercise program  Follow-Up Instructions: No follow-ups on file.   Right Knee Exam   Tenderness  The patient is experiencing tenderness in the medial joint line.  Range of Motion  The patient has normal right knee ROM.  Tests  Varus: negative Valgus: negative  Other  Erythema: absent Effusion: no effusion present      Patient is alert, oriented, no adenopathy, well-dressed, normal affect, normal respiratory effort.   Imaging: No results found. No images are attached to the encounter.  Labs: Lab Results  Component Value Date   HGBA1C 6.4 09/24/2023   HGBA1C 6.4 07/12/2023   HGBA1C 6.1 12/18/2022   ESRSEDRATE 30 12/18/2022   ESRSEDRATE 94 (H) 01/24/2022   ESRSEDRATE 53 (H) 08/21/2021   CRP 4.4 (H) 01/24/2022   CRP 0.8 08/21/2021   CRP 85.3 (H) 06/26/2018   REPTSTATUS 03/12/2014 FINAL 03/10/2014   CULT  03/10/2014    Multiple bacterial morphotypes present, none predominant. Suggest appropriate recollection if clinically indicated. Performed at [facility]    LABORGA NO GROWTH 03/22/2014     Lab Results  Component Value Date   ALBUMIN 4.7 09/24/2023   ALBUMIN 4.5 07/01/2023   ALBUMIN 4.5 01/14/2023    Lab Results  Component Value Date   MG 2.1 09/24/2023   MG 2.0 09/18/2022   MG 2.0 06/28/2022   Lab Results  Component Value Date   VD25OH 51.87 09/24/2023   VD25OH 26.21 (L) 09/18/2022   VD25OH 32.03 06/28/2022    No results found for: "PREALBUMIN"    Latest Ref Rng & Units 09/24/2023   10:29 AM 07/01/2023    1:51 PM 12/18/2022   10:33 AM  CBC EXTENDED  WBC 4.0 - 10.5 K/uL 6.1  5.7  4.9   RBC 3.87 - 5.11 Mil/uL 4.12  4.11  4.04   Hemoglobin 12.0 - 15.0 g/dL 19.1  47.8  29.5   HCT 36.0 - 46.0 % 37.7  37.6  37.1   Platelets 150.0 - 400.0 K/uL 306.0  302.0  248.0   NEUT# 1.4 - 7.7 K/uL  3.6    Lymph# 0.7 - 4.0 K/uL  1.4       There is no height or weight on file to calculate BMI.  Orders:  Orders Placed This Encounter  Procedures   XR Knee 1-2 Views Left   No orders of the defined types were placed in this  encounter.    Procedures: No procedures performed  Clinical Data: No additional findings.  ROS:  All other systems negative, except as noted in the HPI. Review of Systems  Objective: Vital Signs: LMP 03/03/2013   Specialty Comments:  INDICATION: Lumbar radiculopathy. Tingling sensation radiating down the RIGHT leg.   STUDY: MRI of the lumbar spine without intravenous contrast performed on 03/13/2023 10:37 AM.   COMPARISON: 11/08/2020.   TECHNIQUE: Multiplanar, multisequence MR imaging obtained through the lumbar spine without contrast on 03/13/2023 10:37 AM.   CONTRAST: None.   FINDINGS:  # Osseous structures: Anterolateral osteophyte at T12-L1. Vertebral body heights are well-maintained. No acute fracture or concerning marrow signal abnormality appreciated.  #  Alignment:Mild levocurvature noted. No significant listhesis appreciated.  #  Conus medullaris/cauda equina: Spinal cord signal is normal and  terminates at L1. The cauda equina is unremarkable.   #  Lower thoracic spine: Disc desiccation. No significant spinal canal or foraminal stenosis. This is viewed on the sagittal images only.   #  T12-L1: Disc desiccation with a small disc bulge and a superimposed LEFT paracentral disc protrusion. This effaces the ventral thecal sac. Neuroforamen are patent. No significant interval change.  #  L1-L2: Mild disc desiccation with a minimal disc bulge. No significant stenosis. This is viewed on the sagittal images only and is without significant interval change.  #  L2-L3: Mild disc desiccation with a small disc bulge. No significant stenosis. This is unchanged.  #  L3-L4: Mild disc desiccation with a small disc bulge. No significant stenosis. This is unchanged.  #  L4-L5: Mild disc desiccation with a small disc bulge and facet arthrosis. No significant stenosis. This is unchanged.  #  L5-S1: Mild disc desiccation with a posterior disc herniation that is more pronounced on the LEFT. Mild facet arthrosis. No significant stenosis. This is unchanged.   #  Paraspinal tissues: Unremarkable.   #  Additional comments: None.    IMPRESSION:  1. No acute process or significant interval change.  2.  Mild degenerative changes as described above. No significant nerve root encroachment.   Electronically Signed by: Ardyth Man MD, PHD on 03/18/2023 8:43 AM  PMFS History: Patient Active Problem List   Diagnosis Date Noted   Mixed hyperlipidemia 09/04/2023   Prediabetes 09/20/2022   Chronic cough 08/07/2022   Abnormal CT of the chest 08/07/2022   Chronic rhinitis 03/21/2022   Onychomycosis 01/30/2022   Arthralgia 07/19/2021   Immunosuppressed status (HCC) 07/19/2021   Eustachian tube dysfunction 06/13/2021   Osteoporosis 07/11/2020   Atherosclerosis of native coronary artery of native heart without angina pectoris 06/08/2019   Aortic atherosclerosis (HCC) 06/08/2019   Dyslipidemia 05/02/2019    Postural dizziness with presyncope 05/02/2019   HSV-2 (herpes simplex virus 2) infection 11/04/2018   Hypothyroidism s/p total thyroidectomy 1997 08/15/2018   Autoimmune hepatitis (HCC) 08/16/2012   Asthma 03/31/2012   Chronic ulcerative pancolitis (HCC) 03/31/2012   History of renal calculi 03/31/2012   Migraines 03/31/2012   Mitral valve prolapse    Past Medical History:  Diagnosis Date   Asthma    Autoimmune hepatitis (HCC)    Chronic kidney disease    Fatty liver 2023   CT   Hx of pancreatitis 06/16/2018   Kidney stone    Kidney stone    Migraine    Orthostatic hypotension    Osteopenia 03/31/2012   Recurrent upper respiratory infection (URI)    Ulcerative colitis (HCC) 03/31/2012   Urticaria  Family History  Problem Relation Age of Onset   Heart disease Mother    Dementia Mother    Thyroid disease Mother    Osteoarthritis Mother    Asthma Mother    Heart attack Father    Heart attack Brother    Heart disease Brother    Colon cancer Maternal Aunt        dx 58s   Ovarian cancer Paternal Aunt 57   Prostate cancer Paternal Uncle        metastatic; dx 105s   Heart disease Paternal Grandmother    Crohn's disease Daughter 23   Breast cancer Cousin        paternal female cousin; dx 30s   Colon cancer Cousin 16       paternal female cousin   Stomach cancer Neg Hx    Rectal cancer Neg Hx    Esophageal cancer Neg Hx    Liver cancer Neg Hx    Pancreatic cancer Neg Hx     Past Surgical History:  Procedure Laterality Date   BIOPSY  01/25/2022   Procedure: BIOPSY;  Surgeon: Kerin Salen, MD;  Location: [facility];  Service: Gastroenterology;;   CESAREAN SECTION     x3   CHOLECYSTECTOMY     COLONOSCOPY     COLONOSCOPY N/A 01/25/2022   Procedure: COLONOSCOPY;  Surgeon: Kerin Salen, MD;  Location: [facility];  Service: Gastroenterology;  Laterality: N/A;   LITHOTRIPSY     THYROIDECTOMY     1997   Social History   Occupational History   Occupation:  Self-employed  Tobacco Use   Smoking status: Never   Smokeless tobacco: Never  Vaping Use   Vaping status: Never Used  Substance and Sexual Activity   Alcohol use: No   Drug use: No   Sexual activity: Yes

## 2023-10-08 ENCOUNTER — Other Ambulatory Visit (HOSPITAL_COMMUNITY): Payer: Self-pay

## 2023-10-08 ENCOUNTER — Telehealth: Payer: Self-pay

## 2023-10-08 NOTE — Telephone Encounter (Signed)
 Pharmacy Patient Advocate Encounter   Received notification from Physician's Office that prior authorization for Synthroid tablets is required/requested.   Insurance verification completed.   The patient is insured through [employer] .   Per test claim: PA required; PA submitted to above mentioned insurance via CoverMyMeds Key/confirmation #/EOC [facility] Status is pending

## 2023-10-09 ENCOUNTER — Encounter: Payer: Self-pay | Admitting: Family

## 2023-10-09 ENCOUNTER — Ambulatory Visit (INDEPENDENT_AMBULATORY_CARE_PROVIDER_SITE_OTHER): Admitting: Family

## 2023-10-09 DIAGNOSIS — M25562 Pain in left knee: Secondary | ICD-10-CM

## 2023-10-09 MED ORDER — LIDOCAINE HCL 1 % IJ SOLN
5.0000 mL | INTRAMUSCULAR | Status: AC | PRN
  Administered 2023-10-09: 5 mL

## 2023-10-09 MED ORDER — METHYLPREDNISOLONE ACETATE 40 MG/ML IJ SUSP
20.0000 mg | INTRAMUSCULAR | Status: AC | PRN
  Administered 2023-10-09: 20 mg via INTRA_ARTICULAR

## 2023-10-09 NOTE — Progress Notes (Signed)
 Office Visit Note   Patient: Danielle Bond           Date of Birth: Feb 26, 1961           MRN: [id number] Visit Date: 10/09/2023              Requested by: Ardith Dark, MD [address] [place],  [place] [address] PCP: Ardith Dark, MD  Chief Complaint  Patient presents with   Left Knee - Pain      HPI: The patient is a 63 year old woman seen in follow-up for left knee pain she was recently seen for concern of meniscal injury at that time she declined the injection however she has been having increased pain and has not been getting much relief with her conservative measures including TENS unit and IcyHot lidocaine patches and ibuprofen No instability.  No mechanical symptoms. Assessment & Plan: Visit Diagnoses: No diagnosis found.  Plan: Depo-Medrol injection today patient tolerated well she will follow-up in the office as needed  Follow-Up Instructions: No follow-ups on file.   Left Knee Exam   Muscle Strength  The patient has normal left knee strength.  Tenderness  The patient is experiencing tenderness in the medial joint line.  Range of Motion  The patient has normal left knee ROM.  Tests  Varus: negative Valgus: negative  Other  Effusion: no effusion present      Patient is alert, oriented, no adenopathy, well-dressed, normal affect, normal respiratory effort.   Imaging: No results found. No images are attached to the encounter.  Labs: Lab Results  Component Value Date   HGBA1C 6.4 09/24/2023   HGBA1C 6.4 07/12/2023   HGBA1C 6.1 12/18/2022   ESRSEDRATE 30 12/18/2022   ESRSEDRATE 94 (H) 01/24/2022   ESRSEDRATE 53 (H) 08/21/2021   CRP 4.4 (H) 01/24/2022   CRP 0.8 08/21/2021   CRP 85.3 (H) 06/26/2018   REPTSTATUS 03/12/2014 FINAL 03/10/2014   CULT  03/10/2014    Multiple bacterial morphotypes present, none predominant. Suggest appropriate recollection if clinically indicated. Performed at [facility]   LABORGA NO GROWTH  03/22/2014     Lab Results  Component Value Date   ALBUMIN 4.7 09/24/2023   ALBUMIN 4.5 07/01/2023   ALBUMIN 4.5 01/14/2023    Lab Results  Component Value Date   MG 2.1 09/24/2023   MG 2.0 09/18/2022   MG 2.0 06/28/2022   Lab Results  Component Value Date   VD25OH 51.87 09/24/2023   VD25OH 26.21 (L) 09/18/2022   VD25OH 32.03 06/28/2022    No results found for: "PREALBUMIN"    Latest Ref Rng & Units 09/24/2023   10:29 AM 07/01/2023    1:51 PM 12/18/2022   10:33 AM  CBC EXTENDED  WBC 4.0 - 10.5 K/uL 6.1  5.7  4.9   RBC 3.87 - 5.11 Mil/uL 4.12  4.11  4.04   Hemoglobin 12.0 - 15.0 g/dL 84.1  32.4  40.1   HCT 36.0 - 46.0 % 37.7  37.6  37.1   Platelets 150.0 - 400.0 K/uL 306.0  302.0  248.0   NEUT# 1.4 - 7.7 K/uL  3.6    Lymph# 0.7 - 4.0 K/uL  1.4       There is no height or weight on file to calculate BMI.  Orders:  No orders of the defined types were placed in this encounter.  No orders of the defined types were placed in this encounter.    Procedures: Large Joint Inj on  10/09/2023 2:07 PM Indications: pain Details: 18 G 1.5 in needle, anteromedial approach Medications: 5 mL lidocaine 1 %; 20 mg methylPREDNISolone acetate 40 MG/ML Consent was given by the patient.      Clinical Data: No additional findings.  ROS:  All other systems negative, except as noted in the HPI. Review of Systems  Objective: Vital Signs: LMP 03/03/2013   Specialty Comments:  INDICATION: Lumbar radiculopathy. Tingling sensation radiating down the RIGHT leg.   STUDY: MRI of the lumbar spine without intravenous contrast performed on 03/13/2023 10:37 AM.   COMPARISON: 11/08/2020.   TECHNIQUE: Multiplanar, multisequence MR imaging obtained through the lumbar spine without contrast on 03/13/2023 10:37 AM.   CONTRAST: None.   FINDINGS:  # Osseous structures: Anterolateral osteophyte at T12-L1. Vertebral body heights are well-maintained. No acute fracture or concerning marrow  signal abnormality appreciated.  #  Alignment:Mild levocurvature noted. No significant listhesis appreciated.  #  Conus medullaris/cauda equina: Spinal cord signal is normal and terminates at L1. The cauda equina is unremarkable.   #  Lower thoracic spine: Disc desiccation. No significant spinal canal or foraminal stenosis. This is viewed on the sagittal images only.   #  T12-L1: Disc desiccation with a small disc bulge and a superimposed LEFT paracentral disc protrusion. This effaces the ventral thecal sac. Neuroforamen are patent. No significant interval change.  #  L1-L2: Mild disc desiccation with a minimal disc bulge. No significant stenosis. This is viewed on the sagittal images only and is without significant interval change.  #  L2-L3: Mild disc desiccation with a small disc bulge. No significant stenosis. This is unchanged.  #  L3-L4: Mild disc desiccation with a small disc bulge. No significant stenosis. This is unchanged.  #  L4-L5: Mild disc desiccation with a small disc bulge and facet arthrosis. No significant stenosis. This is unchanged.  #  L5-S1: Mild disc desiccation with a posterior disc herniation that is more pronounced on the LEFT. Mild facet arthrosis. No significant stenosis. This is unchanged.   #  Paraspinal tissues: Unremarkable.   #  Additional comments: None.    IMPRESSION:  1. No acute process or significant interval change.  2.  Mild degenerative changes as described above. No significant nerve root encroachment.   Electronically Signed by: Ardyth Man MD, PHD on 03/18/2023 8:43 AM  PMFS History: Patient Active Problem List   Diagnosis Date Noted   Mixed hyperlipidemia 09/04/2023   Prediabetes 09/20/2022   Chronic cough 08/07/2022   Abnormal CT of the chest 08/07/2022   Chronic rhinitis 03/21/2022   Onychomycosis 01/30/2022   Arthralgia 07/19/2021   Immunosuppressed status (HCC) 07/19/2021   Eustachian tube dysfunction 06/13/2021   Osteoporosis  07/11/2020   Atherosclerosis of native coronary artery of native heart without angina pectoris 06/08/2019   Aortic atherosclerosis (HCC) 06/08/2019   Dyslipidemia 05/02/2019   Postural dizziness with presyncope 05/02/2019   HSV-2 (herpes simplex virus 2) infection 11/04/2018   Hypothyroidism s/p total thyroidectomy 1997 08/15/2018   Autoimmune hepatitis (HCC) 08/16/2012   Asthma 03/31/2012   Chronic ulcerative pancolitis (HCC) 03/31/2012   History of renal calculi 03/31/2012   Migraines 03/31/2012   Mitral valve prolapse    Past Medical History:  Diagnosis Date   Asthma    Autoimmune hepatitis (HCC)    Chronic kidney disease    Fatty liver 2023   CT   Hx of pancreatitis 06/16/2018   Kidney stone    Kidney stone    Migraine  Orthostatic hypotension    Osteopenia 03/31/2012   Recurrent upper respiratory infection (URI)    Ulcerative colitis (HCC) 03/31/2012   Urticaria     Family History  Problem Relation Age of Onset   Heart disease Mother    Dementia Mother    Thyroid disease Mother    Osteoarthritis Mother    Asthma Mother    Heart attack Father    Heart attack Brother    Heart disease Brother    Colon cancer Maternal Aunt        dx 80s   Ovarian cancer Paternal Aunt 49   Prostate cancer Paternal Uncle        metastatic; dx 32s   Heart disease Paternal Grandmother    Crohn's disease Daughter 36   Breast cancer Cousin        paternal female cousin; dx 30s   Colon cancer Cousin 10       paternal female cousin   Stomach cancer Neg Hx    Rectal cancer Neg Hx    Esophageal cancer Neg Hx    Liver cancer Neg Hx    Pancreatic cancer Neg Hx     Past Surgical History:  Procedure Laterality Date   BIOPSY  01/25/2022   Procedure: BIOPSY;  Surgeon: Kerin Salen, MD;  Location: [facility];  Service: Gastroenterology;;   CESAREAN SECTION     x3   CHOLECYSTECTOMY     COLONOSCOPY     COLONOSCOPY N/A 01/25/2022   Procedure: COLONOSCOPY;  Surgeon: Kerin Salen, MD;   Location: [facility];  Service: Gastroenterology;  Laterality: N/A;   LITHOTRIPSY     THYROIDECTOMY     1997   Social History   Occupational History   Occupation: Self-employed  Tobacco Use   Smoking status: Never   Smokeless tobacco: Never  Vaping Use   Vaping status: Never Used  Substance and Sexual Activity   Alcohol use: No   Drug use: No   Sexual activity: Yes

## 2023-10-10 NOTE — Telephone Encounter (Signed)
 Received a call from [place] from [facility] stating the medication does in fact need a PA to be submitted.

## 2023-10-11 ENCOUNTER — Other Ambulatory Visit (HOSPITAL_COMMUNITY): Payer: Self-pay

## 2023-10-11 ENCOUNTER — Other Ambulatory Visit

## 2023-10-11 DIAGNOSIS — K921 Melena: Secondary | ICD-10-CM | POA: Diagnosis not present

## 2023-10-11 DIAGNOSIS — K51019 Ulcerative (chronic) pancolitis with unspecified complications: Secondary | ICD-10-CM | POA: Diagnosis not present

## 2023-10-11 LAB — COMPREHENSIVE METABOLIC PANEL
ALT: 16 U/L (ref 0–35)
AST: 25 U/L (ref 0–37)
Albumin: 4.7 g/dL (ref 3.5–5.2)
Alkaline Phosphatase: 68 U/L (ref 39–117)
BUN: 17 mg/dL (ref 6–23)
CO2: 28 meq/L (ref 19–32)
Calcium: 9.4 mg/dL (ref 8.4–10.5)
Chloride: 101 meq/L (ref 96–112)
Creatinine, Ser: 0.86 mg/dL (ref 0.40–1.20)
GFR: 72.14 mL/min (ref >60.00)
Glucose, Bld: 135 mg/dL — ABNORMAL HIGH (ref 70–99)
Potassium: 3.8 meq/L (ref 3.5–5.1)
Sodium: 139 meq/L (ref 135–145)
Total Bilirubin: 0.7 mg/dL (ref 0.2–1.2)
Total Protein: 8 g/dL (ref 6.0–8.3)

## 2023-10-11 LAB — CBC WITH DIFFERENTIAL/PLATELET
Basophils Absolute: 0.1 10*3/uL (ref 0.0–0.1)
Basophils Relative: 0.7 % (ref 0.0–3.0)
Eosinophils Absolute: 0.1 10*3/uL (ref 0.0–0.7)
Eosinophils Relative: 1.4 % (ref 0.0–5.0)
HCT: 38 % (ref 36.0–46.0)
Hemoglobin: 12.8 g/dL (ref 12.0–15.0)
Lymphocytes Relative: 31.2 % (ref 12.0–46.0)
Lymphs Abs: 2.4 10*3/uL (ref 0.7–4.0)
MCHC: 33.7 g/dL (ref 30.0–36.0)
MCV: 90.2 fl (ref 78.0–100.0)
Monocytes Absolute: 0.8 10*3/uL (ref 0.1–1.0)
Monocytes Relative: 10.9 % (ref 3.0–12.0)
Neutro Abs: 4.2 10*3/uL (ref 1.4–7.7)
Neutrophils Relative %: 55.8 % (ref 43.0–77.0)
Platelets: 341 10*3/uL (ref 150.0–400.0)
RBC: 4.22 Mil/uL (ref 3.87–5.11)
RDW: 12.9 % (ref 11.5–15.5)
WBC: 7.6 10*3/uL (ref 4.0–10.5)

## 2023-10-11 NOTE — Telephone Encounter (Signed)
 Left message for patient to call back

## 2023-10-11 NOTE — Telephone Encounter (Signed)
 Spoke to patient to advise of recommendations as per [facility]. Patient states she will try to come for labs and hemocults today but is unsure she can come as she has another appointment at 4 pm. Orders placed in epic.  Patient is again advised of ER precautions and she verbalizes understanding of this.

## 2023-10-11 NOTE — Addendum Note (Signed)
 Addended by: Richardson Chiquito on: 10/11/2023 03:07 PM   Modules accepted: Orders

## 2023-10-11 NOTE — Telephone Encounter (Signed)
 Patient filled a 90 day supply via mail with [facility] on 1.24.2025. It is too soon to fill again through insurance until 4.1.2025. I'm not sure where they are getting a prior authorization required notice from. With it being too soon to fill, it will not allow processing either way. When I do a test claim, the only rejection I get is that the insurance pays a max of 83 days (with retail, full 90 with mail order) and when changed to 83 just to see, it gives too soon rejection but no mention of a PA being needed.

## 2023-10-11 NOTE — Telephone Encounter (Addendum)
 Spoke to patient to advise that per precert team, no rinvoq PA is needed. She will not be able to request medication refill until 10/22/23 though as her last rx was for 90 day supply sent on 08/16/23. Patient states that she agrees with this and told her insurance this previously.   Patient tells me that she is going to have to speak with Dr Adela Lank about another medication for her IBD as this medication is not cost effective. States she has to pay about 6,000 dollars every 3 months. I did ask if she signed up with the Rinvoq coupon program and she tells me she has done this. She later tells me that sometimes she has to pay something, sometimes she does not. Has appointment with Dr Adela Lank on 11/27/23.  Patient also notes that over the last 2 days, she has had dark stools. Does say they look like tar as well. States that she has had constipation recently due to diet changes so she started taking miralax a few days ago. States that she has some left sided "twisting" feelings in her intestines that she always has but denies any worsening, no SOB, chest pain. Does not take pepto, iron supplements or NSAID's. Patient is advised that if she continues to have black tarry stools accompanied by worsening abdominal pain, chest pain, fever, dizziness, SOB, N/V she should seek emergent care. She verbalizes understanding.

## 2023-10-14 NOTE — Telephone Encounter (Signed)
 Sorry to hear this. If she has to pay that amount I don't expect her to continue Rinvoq, that is not sustainable. I am seeing her in May. I do not want her to have to pay that to continue rinvoq in the interim. Maybe she can check with her pharmacy if there is a copay or not when she is next due for a refill in April. If it is a lot or what she has paid in the past, tell her not to get it and will see if I can work her in sooner. If it is covered she should get it filled and we can discuss further at the office.   Of note, her CBC and BUN is NORMAL. I do  not think she is having GI bleeding causing dark stools.

## 2023-10-14 NOTE — Telephone Encounter (Signed)
 Patient returning phone call. Please advise, thank you.

## 2023-10-14 NOTE — Telephone Encounter (Signed)
 Left message for patient to call back

## 2023-10-14 NOTE — Telephone Encounter (Signed)
 Spoke with patient and advised of Dr Lanetta Inch recommendations as well as normal labs. Patient verbalizes understanding.

## 2023-10-16 NOTE — Telephone Encounter (Signed)
 Pharmacy Patient Advocate Encounter  Received notification from [employer] that Prior Authorization for Synthroid tablets  has been DENIED.  Full denial letter will be uploaded to the media tab. See denial reason below.   PA #/Case ID/Reference #: [id number]

## 2023-10-18 ENCOUNTER — Other Ambulatory Visit: Payer: BC Managed Care – PPO

## 2023-10-18 ENCOUNTER — Telehealth: Payer: Self-pay | Admitting: *Deleted

## 2023-10-18 NOTE — Telephone Encounter (Signed)
 Copied from CRM [id number]. Topic: General - Other >> Oct 17, 2023  3:59 PM Alcus Dad wrote: Reason for CRM: Patient stated that she asked Dr. About measles shot. Patient wants to know is there a tier blood test to see if her immune levels are in range.   Ok to order MMR titer?

## 2023-10-18 NOTE — Telephone Encounter (Signed)
 [facility] rep called to ask the status for PA for Rinvoq medication. Tel:[phone]

## 2023-10-18 NOTE — Telephone Encounter (Signed)
 Ok to order but recommend she check with insurance to see if it is covered.  Katina Degree. Jimmey Ralph, MD 10/18/2023 8:29 AM

## 2023-10-21 ENCOUNTER — Other Ambulatory Visit: Payer: Self-pay | Admitting: *Deleted

## 2023-10-21 ENCOUNTER — Ambulatory Visit (INDEPENDENT_AMBULATORY_CARE_PROVIDER_SITE_OTHER): Admitting: Podiatrist

## 2023-10-21 DIAGNOSIS — B351 Tinea unguium: Secondary | ICD-10-CM

## 2023-10-21 DIAGNOSIS — Z0184 Encounter for antibody response examination: Secondary | ICD-10-CM

## 2023-10-21 NOTE — Telephone Encounter (Signed)
 Patient should be able to fill Rinvoq rx on 10/22/23 per pharmacy PA staff.

## 2023-10-21 NOTE — Telephone Encounter (Signed)
 Spoke with patient  Agreed with MMR titer Order and appt placed

## 2023-10-21 NOTE — Progress Notes (Signed)
 Patient presents today for the 7th laser treatment. Diagnosed with mycotic nail infection by Dr. Logan Bores.   Toenail most affected Right hallux , third toe right and 5th toenail right.  All other systems are negative.  Due to past medical history she is unable to take lamisil   Nails were filed thin. Laser therapy was administered to 1-5 toenails right foot and patient tolerated the treatment well. All safety precautions were in place.   Single laser pass was done on non-affected nails.  She is using fungicure topical on her toenails which also appears to be helping the moisture and appearance of the nails.    Follow up in 6 weeks for laser # 8-  slow improvement is noted with laser.  Would recommend assessing after next laser if 1 more laser would be beneficial for her.

## 2023-10-21 NOTE — Telephone Encounter (Signed)
 Patient stated is unable to take other TSH medication due to stomach problems  Will like for [name] to call [phone] for appeal

## 2023-10-22 ENCOUNTER — Ambulatory Visit: Payer: BC Managed Care – PPO | Admitting: Physician Assistant

## 2023-10-22 ENCOUNTER — Ambulatory Visit (INDEPENDENT_AMBULATORY_CARE_PROVIDER_SITE_OTHER)

## 2023-10-22 DIAGNOSIS — K921 Melena: Secondary | ICD-10-CM | POA: Diagnosis not present

## 2023-10-22 DIAGNOSIS — K51019 Ulcerative (chronic) pancolitis with unspecified complications: Secondary | ICD-10-CM

## 2023-10-22 LAB — HEMOCCULT SLIDES (X 3 CARDS)
Fecal Occult Blood: NEGATIVE
OCCULT 1: NEGATIVE
OCCULT 2: NEGATIVE
OCCULT 3: NEGATIVE
OCCULT 4: NEGATIVE
OCCULT 5: NEGATIVE

## 2023-10-22 NOTE — Telephone Encounter (Signed)
 Can you please do an appeal  Patient stated is unable to take other TSH medication due to stomach problems  Will like for [name] to call [phone] for appeal

## 2023-10-22 NOTE — Telephone Encounter (Signed)
 Information has been sent to clinical pharmacist for appeals review. It may take 5-7 days to prepare the necessary documentation to request the appeal from the insurance.

## 2023-10-23 ENCOUNTER — Telehealth: Payer: Self-pay | Admitting: Pharmacist

## 2023-10-23 NOTE — Telephone Encounter (Signed)
 E-Appeal has been submitted for branded Synthroid. Will advise when response is received, please be advised that most companies may take 30 days to make a decision. Appeal letter and supporting documentation have been uploaded and submitted via CMM on 10/23/2023 @10 :19 am.  Thank you, Dellie Burns, PharmD Clinical Pharmacist  [place]  Direct Dial: [phone]

## 2023-10-24 ENCOUNTER — Encounter: Payer: Self-pay | Admitting: Dermatology

## 2023-10-24 ENCOUNTER — Ambulatory Visit: Payer: BC Managed Care – PPO | Admitting: Dermatology

## 2023-10-24 VITALS — BP 137/88 | HR 100

## 2023-10-24 DIAGNOSIS — B353 Tinea pedis: Secondary | ICD-10-CM

## 2023-10-24 DIAGNOSIS — L821 Other seborrheic keratosis: Secondary | ICD-10-CM

## 2023-10-24 DIAGNOSIS — B351 Tinea unguium: Secondary | ICD-10-CM | POA: Diagnosis not present

## 2023-10-24 MED ORDER — JUBLIA 10 % EX SOLN: 1.0000 | Freq: Every day | CUTANEOUS | 5 refills | Status: DC

## 2023-10-24 MED ORDER — KETOCONAZOLE 2 % EX CREA: 1.0000 | TOPICAL_CREAM | Freq: Two times a day (BID) | CUTANEOUS | 5 refills | Status: AC

## 2023-10-24 NOTE — Progress Notes (Signed)
   New Patient Visit   Subjective  Danielle Bond is a 63 y.o. female who presents for the following: Patient here concerned about a rash on foot.   Patient states she has red bumps that come up, pop and itch.  She is undergoing laser treatment for tinea on her right foot and believes it is helping.  The following portions of the chart were reviewed this encounter and updated as appropriate: medications, allergies, medical history  Review of Systems:  No other skin or systemic complaints except as noted in HPI or Assessment and Plan.  Objective  Well appearing patient in no apparent distress; mood and affect are within normal limits.   A focused examination was performed of the following areas: Right foot  Relevant exam findings are noted in the Assessment and Plan.    Assessment & Plan  ONYCHOMYCOSIS Exam: Thickened toenails with subungal debris c/w onychomycosis  - Assessment: Patient has been undergoing laser therapy for nail fungus for approximately one year with some improvement noted. Treatment kills the fungus but does not reverse dystrophic nail changes. Success is indicated by normal nail growth. Patient has been using a topical fungicide at night.  Chronic and persistent condition with duration or expected duration over one year. Condition is symptomatic/ bothersome to patient. Not currently at goal.  Treatment Plan: Patient is undergoing laser treatment with podiatry.    We are adding Jublia to help expedite treatment.   Apply Jublia to the affected toenails daily for 6-9 months.  TINEA PEDIS Exam: Scaling and maceration web spaces and over distal and lateral soles.  - Assessment: Patient presents with a rash on the feet, attributed to fungal spread from the nail infection (onychomycosis) causing chronic tinea pedis.  Treatment Plan: Patient to start using ketoconazole cream 2x daily on and around right foot and toes for 3 month  We also instructed the  patient to use zeasorb powder 1x per week on her shoes.   SEBORRHEIC KERATOSIS - Stuck-on, waxy, tan-brown papules and/or plaques  - Benign-appearing - Discussed benign etiology and prognosis. - Observe - Call for any changes    Skin care: Exfoliate skin daily to help reduce the appearance of age spots.    No follow-ups on file.  IManual Meier, Surg Tech III, am acting as scribe for [employer], DO.   Documentation: I have reviewed the above documentation for accuracy and completeness, and I agree with the above.  Langston Reusing, DO

## 2023-10-24 NOTE — Telephone Encounter (Signed)
 Additional information has been requested from the patient's insurance in order to proceed with the appeal request. Requested information has been sent, or form has been filled out and faxed back to [phone]

## 2023-10-24 NOTE — Patient Instructions (Addendum)
 Hello Danielle Bond,  Thank you for visiting today. Here is a summary of the key instructions:  - Medications:   - Apply Jublia to affected toenails every day for 6 to 9 months   - Apply ketoconazole to feet and between toes every day for 3 months     - Home Care:   - Treat shoes with Zeasorb powder once a week   - Exfoliate skin to reduce appearance of age spots (seborrheic keratoses)  - Follow-up:   - Return for follow-up appointment in 5 months  We look forward to seeing you at your next visit. If you have any questions or concerns before then, please do not hesitate to contact our office.  Warm regards,  Dr. Langston Reusing, Dermatology            Important Information  Due to recent changes in healthcare laws, you may see results of your pathology and/or laboratory studies on MyChart before the doctors have had a chance to review them. We understand that in some cases there may be results that are confusing or concerning to you. Please understand that not all results are received at the same time and often the doctors may need to interpret multiple results in order to provide you with the best plan of care or course of treatment. Therefore, we ask that you please give [name] 2 business days to thoroughly review all your results before contacting the office for clarification. Should we see a critical lab result, you will be contacted sooner.   If You Need Anything After Your Visit  If you have any questions or concerns for your doctor, please call our main line at [phone] If no one answers, please leave a voicemail as directed and we will return your call as soon as possible. Messages left after 4 pm will be answered the following business day.   You may also send [name] a message via MyChart. We typically respond to MyChart messages within 1-2 business days.  For prescription refills, please ask your pharmacy to contact our office. Our fax number is [phone].  If you have  an urgent issue when the clinic is closed that cannot wait until the next business day, you can page your doctor at the number below.    Please note that while we do our best to be available for urgent issues outside of office hours, we are not available 24/7.   If you have an urgent issue and are unable to reach [name], you may choose to seek medical care at your doctor's office, retail clinic, urgent care center, or emergency room.  If you have a medical emergency, please immediately call 911 or go to the emergency department. In the event of inclement weather, please call our main line at [phone] for an update on the status of any delays or closures.  Dermatology Medication Tips: Please keep the boxes that topical medications come in in order to help keep track of the instructions about where and how to use these. Pharmacies typically print the medication instructions only on the boxes and not directly on the medication tubes.   If your medication is too expensive, please contact our office at [phone] or send [name] a message through MyChart.   We are unable to tell what your co-pay for medications will be in advance as this is different depending on your insurance coverage. However, we may be able to find a substitute medication at lower cost or fill out paperwork to get insurance  to cover a needed medication.   If a prior authorization is required to get your medication covered by your insurance company, please allow [name] 1-2 business days to complete this process.  Drug prices often vary depending on where the prescription is filled and some pharmacies may offer cheaper prices.  The website www.goodrx.com contains coupons for medications through different pharmacies. The prices here do not account for what the cost may be with help from insurance (it may be cheaper with your insurance), but the website can give you the price if you did not use any insurance.  - You can print the associated  coupon and take it with your prescription to the pharmacy.  - You may also stop by our office during regular business hours and pick up a GoodRx coupon card.  - If you need your prescription sent electronically to a different pharmacy, notify our office through [facility] or by phone at [phone]

## 2023-10-24 NOTE — Telephone Encounter (Signed)
 Noted.

## 2023-10-25 NOTE — Telephone Encounter (Signed)
 Pharmacy Patient Advocate Encounter  Received notification from [employer] that Prior Authorization Appeal for Synthroid 100 mcg tablet has been APPROVED from 09-24-2023 to 10-23-2024

## 2023-10-28 ENCOUNTER — Other Ambulatory Visit

## 2023-10-28 NOTE — Telephone Encounter (Signed)
 Patient notified Rx  Synthroid 100 mcg tablet has been APPROVED from 09-24-2023 to 10-23-2024

## 2023-10-29 ENCOUNTER — Other Ambulatory Visit (HOSPITAL_COMMUNITY): Payer: Self-pay

## 2023-10-30 ENCOUNTER — Other Ambulatory Visit (HOSPITAL_COMMUNITY): Payer: Self-pay

## 2023-10-30 ENCOUNTER — Other Ambulatory Visit

## 2023-10-30 DIAGNOSIS — Z0184 Encounter for antibody response examination: Secondary | ICD-10-CM

## 2023-10-31 LAB — MEASLES/MUMPS/RUBELLA IMMUNITY
Mumps IgG: >300 [AU]/ml
Rubella: 12.1 {index}
Rubeola IgG: >300 [AU]/ml

## 2023-11-01 NOTE — Progress Notes (Signed)
 Great news.  Her MMR titer is all positive.  She has full immunity to measles, mumps, and rubella.  Does not need any boosters at this point.

## 2023-11-05 ENCOUNTER — Telehealth: Payer: Self-pay | Admitting: Gastroenterology

## 2023-11-05 NOTE — Telephone Encounter (Signed)
 Could you guys please help with this? Insurance is telling patient that PA is needed for Rinvoq. You all are seeing that it is a "too soon to fill". Not sure what I need to do on my end.   See 09/04/23 phone note.

## 2023-11-05 NOTE — Telephone Encounter (Signed)
 Patient called and stated that per her last conversation with Dottie and her insurance company they have not sent her the Rinvoq and patient keeps getting the same information that her provider was not sending in the correct information. Patient is requesting to speak to [facility]. Please advise.

## 2023-11-06 ENCOUNTER — Other Ambulatory Visit (HOSPITAL_COMMUNITY): Payer: Self-pay

## 2023-11-06 ENCOUNTER — Telehealth: Payer: Self-pay

## 2023-11-06 NOTE — Telephone Encounter (Signed)
 Danielle Bond,  Thank you so much for taking care of that! I appreciate it so much.   Patient has been advised that our PA team was able to contact insurance by phone this morning and they have gotten her Rinvoq approved. Advised she should now be able to get this through her specialty pharmacy. Patient verbalizes understanding and says thank you to the PA team for helping her.

## 2023-11-06 NOTE — Telephone Encounter (Addendum)
 They could not tell me where it was started but I was able to complete the pa over the phone. She informed me that they had sent additional information questions to the your office. I'm not sure if they were never received or they were misplaced. I changed to our fax number for future pa's so hopefully there will not be any future confusion. PA was approved and patient can get at a specialty pharmacy.

## 2023-11-06 NOTE — Telephone Encounter (Signed)
 Pharmacy Patient Advocate Encounter   Received notification from Pt Calls Messages that prior authorization for Rinvoq 30MG  er tablets is required/requested.   Insurance verification completed.   The patient is insured through [employer] .   KEY [id number]  Per test claim: PA has already been submitted and is in process for this patient and drug.  Case ID [id number] Status in progress

## 2023-11-06 NOTE — Telephone Encounter (Signed)
 PA request has been Cancelled. New Encounter has been or will be created for follow up. For additional info see Pharmacy Prior Auth telephone encounter from 11-06-2023.

## 2023-11-06 NOTE — Telephone Encounter (Signed)
 Pharmacy Patient Advocate Encounter   Received notification from Pt Calls Messages that prior authorization for Rinvoq 30MG  er tablets is required/requested.   Insurance verification completed.   The patient is insured through [employer] .   Prior Authorization for Rinvoq 30MG  er tablets has been APPROVED from 10-07-2023 to 11-05-2024   PA #/Case ID/Reference #: [id number]

## 2023-11-12 DIAGNOSIS — H93291 Other abnormal auditory perceptions, right ear: Secondary | ICD-10-CM | POA: Diagnosis not present

## 2023-11-12 DIAGNOSIS — H6991 Unspecified Eustachian tube disorder, right ear: Secondary | ICD-10-CM | POA: Diagnosis not present

## 2023-11-12 DIAGNOSIS — M26609 Unspecified temporomandibular joint disorder, unspecified side: Secondary | ICD-10-CM | POA: Diagnosis not present

## 2023-11-15 DIAGNOSIS — R079 Chest pain, unspecified: Secondary | ICD-10-CM | POA: Diagnosis not present

## 2023-11-15 DIAGNOSIS — I10 Essential (primary) hypertension: Secondary | ICD-10-CM | POA: Diagnosis not present

## 2023-11-15 DIAGNOSIS — M79603 Pain in arm, unspecified: Secondary | ICD-10-CM | POA: Diagnosis not present

## 2023-11-15 DIAGNOSIS — R000 Tachycardia, unspecified: Secondary | ICD-10-CM | POA: Diagnosis not present

## 2023-11-18 ENCOUNTER — Telehealth: Payer: Self-pay | Admitting: Cardiology

## 2023-11-18 NOTE — Telephone Encounter (Signed)
 Pt c/o BP issue: STAT if pt c/o blurred vision, one-sided weakness or slurred speech.  STAT if BP is GREATER than 180/120 TODAY.  STAT if BP is LESS than 90/60 and SYMPTOMATIC TODAY  1. What is your BP concern?   Patient had an episode of high BP on Friday, 4/25  2. Have you taken any BP medication today?  Not taken yet  3. What are your last 5 BP readings?  4/26 129/76  HR 90  at 12:04 pm 4/26 115/77 HR 55 at 8:36 pm 4/27  120/79  HR 80 at 10:30 am 4/27   121/85  HR 87 at 2:15 pm 4/28 (while on phone) 146/80  HR 86   4. Are you having any other symptoms (ex. Dizziness, headache, blurred vision, passed out)?  During episode she was dizzy, lightheaded and clammy  Patient stated she was outside for a long time and was not drinking enough water and her BP rose to 230/160.  Patient noted she had sharp pain on left side of her chest, with numbness in left arm and tightness in left back. Patient stated she called the paramedics and her BP was 230/16. Patient stated she also had an EKG done which was normal and the recommended she have blood work done.  Patient stated she has been having pressure in her head and noted she has sinus issues with allergies.  Patient wants to get orders for lab work as recommended by the paramedics.

## 2023-11-18 NOTE — Telephone Encounter (Signed)
 Spoke with patient and she stated that she was outside yesterday and not drinking a lot of fluids. She had stabbing chest pain, radiating down her arm, she was SOB, clammy and dizzy. Her BP was elevated 230/160. EMS came and did EKG. Informed her she can go to ED or wait until Monday and give us  call and make sure she get labs. No chest pain but she is still SOB at times.  ED precautions discussed. Appointment scheduled with provider

## 2023-11-19 ENCOUNTER — Ambulatory Visit: Attending: Cardiology | Admitting: Cardiology

## 2023-11-19 ENCOUNTER — Encounter: Payer: Self-pay | Admitting: Cardiology

## 2023-11-19 VITALS — BP 123/83 | HR 90 | Ht 60.0 in | Wt 154.0 lb

## 2023-11-19 DIAGNOSIS — I700 Atherosclerosis of aorta: Secondary | ICD-10-CM | POA: Diagnosis not present

## 2023-11-19 DIAGNOSIS — I10 Essential (primary) hypertension: Secondary | ICD-10-CM | POA: Diagnosis not present

## 2023-11-19 MED ORDER — METOPROLOL SUCCINATE ER 50 MG PO TB24: 50.0000 mg | ORAL_TABLET | Freq: Every evening | ORAL | 2 refills | Status: AC

## 2023-11-19 NOTE — Patient Instructions (Signed)
 Medication Instructions:  CHANGE to take metoprolol  nightly  *If you need a refill on your cardiac medications before your next appointment, please call your pharmacy*  Testing/Procedures: RENAL ARTERY DUPLEX   Your physician has requested that you have a renal artery duplex. During this test, an ultrasound is used to evaluate blood flow to the kidneys. Allow one hour for this exam. Do not eat after midnight the day before and avoid carbonated beverages. Take your medications as you usually do.   Follow-Up: At [facility], you and your health needs are our priority.  As part of our continuing mission to provide you with exceptional heart care, our providers are all part of one team.  This team includes your primary Cardiologist (physician) and Advanced Practice Providers or APPs (Physician Assistants and Nurse Practitioners) who all work together to provide you with the care you need, when you need it.  Your next appointment:   6 month(s)  Provider:   Cody Das, MD    We recommend signing up for the patient portal called "MyChart".  Sign up information is provided on this After Visit Summary.  MyChart is used to connect with patients for Virtual Visits (Telemedicine).  Patients are able to view lab/test results, encounter notes, upcoming appointments, etc.  Non-urgent messages can be sent to your provider as well.   To learn more about what you can do with MyChart, go to [email or web address].

## 2023-11-19 NOTE — Progress Notes (Signed)
 Cardiology Office Note:  .   Date:  11/19/2023  ID:  Danielle Bond, DOB October 25, 1960, MRN [id number] PCP: Rodney Clamp, MD  [facility] Providers Cardiologist:  Fransico Ivy, MD PCP: Rodney Clamp, MD  Chief Complaint  Patient presents with   Hypertension      History of Present Illness: .    Danielle Bond is a 63 y.o. female with hyperlipidemia, aortic and coronary atherrosclerosis, presyncope, Graves' disease, s/p thyroidectomy, ulcerative colitis, h/o acute pancreatitis, migraine  Patient recently had an episode of chest pain and suspected blood pressure.  She had been working on a few chores, had not been hydrating herself.  She experienced left-sided sharp chest pain with radiation to her shoulder.  She called 911.  EMS arrived, EKG showed no acute ischemic abnormality.  Blood pressure was extremely elevated at 230/110 mmHg, subsequently came down.  She did not go to the hospital at that time.  Chest pain resolved with resolution of hypertension.  Since then, blood pressure has been well-controlled.  Vitals:   11/19/23 1542  BP: 123/83  Pulse: 90  SpO2: 99%     ROS:  Review of Systems  Cardiovascular:  Positive for chest pain (As per HPI). Negative for dyspnea on exertion, leg swelling, palpitations and syncope.     Studies Reviewed: Aaron Aas        EKG 11/19/2023: Normal sinus rhythm Normal ECG When compared with ECG of 04-Sep-2023 13:04, Nonspecific T wave abnormality no longer evident in Lateral leads     Independently interpreted 06/2023: Chol 210, TG 110, HDL 82, LDL 106 HbA1C 6.4% Hb 12.7 Cr 0.79   Physical Exam:   Physical Exam Vitals and nursing note reviewed.  Constitutional:      General: She is not in acute distress. Neck:     Vascular: No JVD.  Cardiovascular:     Rate and Rhythm: Normal rate and regular rhythm.     Heart sounds: Normal heart sounds. No murmur heard. Pulmonary:     Effort: Pulmonary effort is normal.      Breath sounds: Normal breath sounds. No wheezing or rales.  Musculoskeletal:     Right lower leg: No edema.     Left lower leg: No edema.      VISIT DIAGNOSES:   ICD-10-CM   1. Aortic atherosclerosis (HCC)  I70.0 EKG 12-Lead    2. Primary hypertension  I10 VAS US  RENAL ARTERY DUPLEX       ASSESSMENT AND PLAN: .    Danielle Bond is a 63 y.o. female with hyperlipidemia, aortic and coronary atherrosclerosis, presyncope, Graves' disease, s/p thyroidectomy, ulcerative colitis, h/o acute pancreatitis, migraine  Chest pain: Episode of chest pain in the setting of accelerated hypertension with blood pressure 230/110 mmHg.  Blood pressure well-controlled at baseline, on metoprolol  succinate 50 mg daily.  I wonder if she has renal artery stenosis that could have caused spike in her blood pressure.  Check renal artery duplex.  Continue home monitoring.  In absence of any other exertional chest pain, reasonable to hold off any ischemic testing at this time.  Known minimally calcific nonobstructive disease on previous CT in 2019.   Mixed hyperlipidemia, aortic and coronary atherosclerosis: HDL >LDL, continue current medications-Crestor  10 mg, Zetia  10 mg daily.   Not on Aspirin due to h/o GI bleeding with ulcerative colitis.        Meds ordered this encounter  Medications   metoprolol  succinate (TOPROL -XL) 50 MG 24 hr tablet  Sig: Take 1 tablet (50 mg total) by mouth at bedtime.    Dispense:  90 tablet    Refill:  2     F/u in 6 months  Signed, Cody Das, MD

## 2023-11-27 ENCOUNTER — Ambulatory Visit (INDEPENDENT_AMBULATORY_CARE_PROVIDER_SITE_OTHER): Payer: BC Managed Care – PPO | Admitting: Gastroenterology

## 2023-11-27 ENCOUNTER — Encounter: Payer: Self-pay | Admitting: Gastroenterology

## 2023-11-27 VITALS — BP 124/82 | HR 98 | Ht 60.0 in | Wt 154.0 lb

## 2023-11-27 DIAGNOSIS — R140 Abdominal distension (gaseous): Secondary | ICD-10-CM

## 2023-11-27 DIAGNOSIS — K51019 Ulcerative (chronic) pancolitis with unspecified complications: Secondary | ICD-10-CM | POA: Diagnosis not present

## 2023-11-27 DIAGNOSIS — K754 Autoimmune hepatitis: Secondary | ICD-10-CM | POA: Diagnosis not present

## 2023-11-27 DIAGNOSIS — Z79899 Other long term (current) drug therapy: Secondary | ICD-10-CM

## 2023-11-27 NOTE — Patient Instructions (Signed)
 You will be due for a recall colonoscopy in 02-2025. We will send you a reminder in the mail when it gets closer to that time.  We are giving you a FODZYME handout today  You will be due for blood work in September. We will remind you when it is time to go to the lab.  Please follow up in 6 months (Nov 2025).  Thank you for entrusting me with your care and for choosing [facility], Dr. Alvester Johnson   If your blood pressure at your visit was 140/90 or greater, please contact your primary care physician to follow up on this. ______________________________________________________  If you are age 52 or older, your body mass index should be between 23-30. Your Body mass index is 30.08 kg/m. If this is out of the aforementioned range listed, please consider follow up with your Primary Care Provider.  If you are age 82 or younger, your body mass index should be between 19-25. Your Body mass index is 30.08 kg/m. If this is out of the aformentioned range listed, please consider follow up with your Primary Care Provider.  ________________________________________________________  The [facility] providers would like to encourage you to use MYCHART to communicate with providers for non-urgent requests or questions.  Due to long hold times on the telephone, sending your provider a message by [facility] may be a faster and more efficient way to get a response.  Please allow 48 business hours for a response.  Please remember that this is for non-urgent requests.  _______________________________________________________  Due to recent changes in healthcare laws, you may see the results of your imaging and laboratory studies on MyChart before your provider has had a chance to review them.  We understand that in some cases there may be results that are confusing or concerning to you. Not all laboratory results come back in the same time frame and the provider may be waiting for multiple results in order  to interpret others.  Please give us  48 hours in order for your provider to thoroughly review all the results before contacting the office for clarification of your results.

## 2023-11-27 NOTE — Progress Notes (Signed)
 HPI :  63 year old female here for a follow-up visit:   Ulcerative colitis history: Diagnosed in 2007.  Symptoms in the past have been cramping/twisting left lower quadrant pain, diarrhea, rectal bleeding.  She has been on Lialda  and Apriso  in the past which did not control her symptoms.  She was placed on Humira  in March 2020 and did well on that regimen until July 2023 when she failed, admitted with a severe flare of her colitis-she had loss of weight, bleeding, diarrhea.  Treated with steroids, course complicated by C. difficile for which she had therapy for that recovered.  Of note it sounds like she had rash to Dificid  at that time. Eventually transitioned to Rinvoq  October 2023.  Of note it looks like she has an allergy reported to mesalamine , husband states it was not an allergy but she simply failed it.  She also has reported history of a rash to azathioprine in the past and hives to vancomycin.  Her daughter has Crohn's disease.     History of autoimmune hepatitis.   Admitted to the hospital in April 2014 with an elevated ALT of 1200.  At that time she had a markedly elevated IgG, ANA, smooth muscle antibody, as well as an AMA.  She did not think she was taking any new medicines at the time other than Dilaudid  given to her in the ED.  She had a liver biopsy at the time which was consistent with autoimmune hepatitis.  She was treated with steroids at that time but she states she is never had any follow-up for this particular issue.  Her liver enzymes have been relatively normal since then. CT scan in July of last year during time of her colitis flare which showed a normal-appearing liver.  She denies any family history of liver disease.  She has seen rheumatologist in the past for joint pains.  She has since followed up with [facility] of heptology and they recommended monitoring and checking labs every 6 months. ALT has been normal.      SINCE LAST VISIT:   In regards to her colitis she  has been doing very well since have last seen her.  Colonoscopy done in August 2024, there is no significant inflammation (only microscopic mild inflammation), she does have some pseudo inflammatory polyps.  She has continued Rinvoq  daily.  She has had some constipation with hard stools, generally has 1 bowel movement per day.  She has been taking MiraLAX  as needed, not reliably.  She has a lot of bloating and abdominal distention which we have discussed in the past.  She does take some IBgard which provides benefit when she takes it, she does not take it too often.  We had discussed trying Fodzyme at the last visit which she has not done yet.  Weight stable, she is eating well.  Her main frustration with the Rinvoq  is that her insurance does not reliably refill this every 3 months and she requires prior authorization every few months which is a hassle for her.  Despite it being approved by her insurance it is still a struggle to get them to reliably send it to her without a large co-pay.  She also has been noted to have some elevated cholesterol on Rinvoq  and now on Crestor  10 mg daily.  We discussed these issues and if it was worthwhile for her to switch to other options.  We discussed other options for treatment of her Rinvoq .  Fecal calprotectin done at the  end of February was only 5.  Labs in March showed no anemia, normal white blood cell count.  Otherwise she had LFTs checked at the end of March and ALT is 16, AST 25.  Vaccinations are up to date.    Prior workup: Colonoscopy 01/25/2022 - Preparation of the colon was fair. - The examined portion of the ileum was normal. - Congested, erythematous, friable (with contact bleeding), hemorrhagic, inflamed, ulcerated and vascular-pattern-decreased mucosa in the entire examined colon. Biopsied.   Fecal calprotectin 01/2022 - > 3000     Labs in May 12/18/22- reviewed in Epic   ESR 30 CRP 0.520 A1c 6.4 Iron 88, ferritin 29, iron sat 24.3%, TIBC  362 B12 763 TSH 1.37 BUN 13, Cr 0.84 AP 54, AST 29, ALT 18, T prot 7.1, T bil 0.6, alb 4.2 WBC 4.9, Hgb 12.1, HCT 37.1, plt 248   Lipids 09/18/22: trig 96, HDL 96, LDL 142     Echo 11/12/21: EF 60-65%, grade I MR, mild TR , no pulm HTN     ANA (+) 1:320 SMA (+) 22 IgG 1456 AMA negative   Referred to hepatology   Fecal calprotectin 01/15/23 - 129     Colonoscopy 03/06/23: - The perianal and digital rectal examinations were normal. - The terminal ileum appeared normal. - A localized area of mildly erythematous mucosa was found at the hepatic flexure. - Anal papilla(e) were hypertrophied. - There were a few benign pseudoinflammatory polyps in the proximal left colon and distal transverse colon. The exam was otherwise without abnormality. No significant inflammation noted. - Biopsies were taken with a cold forceps for histology from the right, transverse, left colon, and rectum.   1. Surgical [P], left colon - CHRONIC ACTIVE COLITIS (POLYPOID FRAGMENTS) - NEGATIVE FOR GRANULOMATA, DYSPLASIA OR MALIGNANCY - SEE NOTE 2. Surgical [P], colon, transverse - MILD CHRONIC, MINIMALLY ACTIVE COLITIS - NEGATIVE FOR GRANULOMATA, DYSPLASIA OR MALIGNANCY - SEE NOTE 3. Surgical [P], right colon biopsies - COLONIC MUCOSA WITH MILD ARCHITECTURAL DISARRAY - NEGATIVE FOR SIGNIFICANT ACTIVE INFLAMMATION, DEFINITIVE CHRONIC CHANGES, GRANULOMATA, DYSPLASIA OR MALIGNANCY     Fecal calprotectin 09/17/23 - 5   Past Medical History:  Diagnosis Date   Asthma    Autoimmune hepatitis (HCC)    Chronic kidney disease    Fatty liver 2023   CT   Hx of pancreatitis 06/16/2018   Kidney stone    Kidney stone    Migraine    Orthostatic hypotension    Osteopenia 03/31/2012   Recurrent upper respiratory infection (URI)    Ulcerative colitis (HCC) 03/31/2012   Urticaria      Past Surgical History:  Procedure Laterality Date   BIOPSY  01/25/2022   Procedure: BIOPSY;  Surgeon: Genell Ken, MD;   Location: [facility];  Service: Gastroenterology;;   CESAREAN SECTION     x3   CHOLECYSTECTOMY     COLONOSCOPY     COLONOSCOPY N/A 01/25/2022   Procedure: COLONOSCOPY;  Surgeon: Genell Ken, MD;  Location: [facility];  Service: Gastroenterology;  Laterality: N/A;   LITHOTRIPSY     THYROIDECTOMY     1997   Family History  Problem Relation Age of Onset   Heart disease Mother    Dementia Mother    Thyroid  disease Mother    Osteoarthritis Mother    Asthma Mother    Heart attack Father    Heart attack Brother    Heart disease Brother    Colon cancer Maternal Aunt  dx 40s   Ovarian cancer Paternal Aunt 26   Prostate cancer Paternal Uncle        metastatic; dx 85s   Heart disease Paternal Grandmother    Crohn's disease Daughter 71   Breast cancer Cousin        paternal female cousin; dx 45s   Colon cancer Cousin 97       paternal female cousin   Stomach cancer Neg Hx    Rectal cancer Neg Hx    Esophageal cancer Neg Hx    Liver cancer Neg Hx    Pancreatic cancer Neg Hx    Social History   Tobacco Use   Smoking status: Never   Smokeless tobacco: Never  Vaping Use   Vaping status: Never Used  Substance Use Topics   Alcohol  use: No   Drug use: No   Current Outpatient Medications  Medication Sig Dispense Refill   albuterol  (PROVENTIL ) (2.5 MG/3ML) 0.083% nebulizer solution Take 3 mLs (2.5 mg total) by nebulization every 6 (six) hours as needed for wheezing or shortness of breath. 150 mL 1   albuterol  (VENTOLIN  HFA) 108 (90 Base) MCG/ACT inhaler TAKE 2 PUFFS BY MOUTH EVERY 6 HOURS AS NEEDED FOR WHEEZE OR SHORTNESS OF BREATH 25.5 each 7   budesonide -formoterol  (SYMBICORT ) 160-4.5 MCG/ACT inhaler Inhale 1 puff into the lungs 2 (two) times daily.     cholecalciferol (VITAMIN D3) 25 MCG (1000 UNIT) tablet Take 2,000 Units by mouth daily.     Cholecalciferol 1.25 MG (50000 UT) capsule Take 1 capsule every week by oral route, for 8 weeks.     [facility] VITAMIN B12 1000 MCG tablet  Take 1,000 mcg by mouth daily.     Efinaconazole  (JUBLIA ) 10 % SOLN Apply 1 Application topically daily. 5 mL 5   EPINEPHrine  0.3 mg/0.3 mL IJ SOAJ injection Inject 0.3 mg into the muscle as needed for anaphylaxis. 2 each 1   ezetimibe  (ZETIA ) 10 MG tablet Take 1 tablet (10 mg total) by mouth daily. 90 tablet 3   folic acid  (FOLVITE ) 1 MG tablet TAKE 1 TABLET BY MOUTH EVERY DAY FOR 30 DAYS (Patient taking differently: Take 1 mg by mouth daily.) 90 tablet 1   ketoconazole  (NIZORAL ) 2 % cream Apply 1 Application topically 2 (two) times daily. 30 g 5   Magnesium  500 MG TABS Take 500 mg by mouth daily.     metoprolol  succinate (TOPROL -XL) 50 MG 24 hr tablet Take 1 tablet (50 mg total) by mouth at bedtime. 90 tablet 2   Multiple Vitamin (MULTIVITAMIN WITH MINERALS) TABS tablet Take 1 tablet by mouth daily.     NON FORMULARY [place] Beet     Omega 3-6-9 Fatty Acids (OMEGA 3-6-9 PO) Take 1 tablet by mouth daily.     polyethylene glycol (MIRALAX ) 17 g packet Use once or twice daily 14 each 0   RINVOQ  30 MG TB24 Take 1 tablet (30 mg total) by mouth daily. 90 tablet 3   rosuvastatin  (CRESTOR ) 10 MG tablet TAKE 1 TABLET BY MOUTH EVERY DAY 90 tablet 3   SYNTHROID  100 MCG tablet TAKE 1 TABLET BY MOUTH EVERY DAY IN THE MORNING 30 tablet 5   valACYclovir  (VALTREX ) 500 MG tablet 1 tab bid x 3 days for new symptoms 30 tablet 0   vitamin C (ASCORBIC ACID) 500 MG tablet Take 500 mg by mouth daily.     ofloxacin  (FLOXIN ) 0.3 % OTIC solution Place 5 drops into both ears daily. (Patient not taking: Reported  on 11/27/2023) 5 mL 0   No current facility-administered medications for this visit.   Allergies  Allergen Reactions   Almond Oil Hives and Shortness Of Breath   Lialda  [Mesalamine ] Other (See Comments)    Vomiting and rectal bleeding   Other Anaphylaxis    Almonds, tree nuts   Aspirin Hives and Nausea Only    Stomach cramps   Hydromorphone  Hcl Other (See Comments)    Caused Enzyme levels to go up    Penicillins Hives and Swelling    Has patient had a PCN reaction causing immediate rash, facial/tongue/throat swelling, SOB or lightheadedness with hypotension: Yes Has patient had a PCN reaction causing severe rash involving mucus membranes or skin necrosis: No Has patient had a PCN reaction that required hospitalization: No Has patient had a PCN reaction occurring within the last 10 years: No  Stomach cramps If all of the above answers are "NO", then may proceed with Cephalosporin use.    Acetaminophen  Other (See Comments)    Has autoimmune hepatitis, wants to avoid APAP   Apriso  [Mesalamine  Er] Nausea And Vomiting   Corticosteroids Other (See Comments)    Had acute pancreatitis x 2 after steroid use.   Imitrex [Sumatriptan] Swelling    Injection caused swelling   Stadol [Butorphanol] Other (See Comments)    hallucinates   Topamax  [Topiramate ] Other (See Comments)    Kidney stones   Vancomycin Hives   Azathioprine Rash   Pepcid [Famotidine] Nausea And Vomiting    Makes acid worse   Prednisone  Nausea And Vomiting    All steroids causes nausea and acute pancreatitis      Review of Systems: All systems reviewed and negative except where noted in HPI.   Lab Results  Component Value Date   WBC 7.6 10/11/2023   HGB 12.8 10/11/2023   HCT 38.0 10/11/2023   MCV 90.2 10/11/2023   PLT 341.0 10/11/2023    Lab Results  Component Value Date   NA 139 10/11/2023   CL 101 10/11/2023   K 3.8 10/11/2023   CO2 28 10/11/2023   BUN 17 10/11/2023   CREATININE 0.86 10/11/2023   GFR 72.14 10/11/2023   CALCIUM  9.4 10/11/2023   ALBUMIN 4.7 10/11/2023   GLUCOSE 135 (H) 10/11/2023    Lab Results  Component Value Date   ALT 16 10/11/2023   AST 25 10/11/2023   ALKPHOS 68 10/11/2023   BILITOT 0.7 10/11/2023     Physical Exam: BP 124/82 (BP Location: Left Arm, Patient Position: Sitting, Cuff Size: Normal)   Pulse 98   Ht 5' (1.524 m)   Wt 154 lb (69.9 kg)   LMP 03/03/2013    SpO2 98%   BMI 30.08 kg/m  Constitutional: Pleasant,well-developed, female in no acute distress. Neurological: Alert and oriented to person place and time. Psychiatric: Normal mood and affect. Behavior is normal.   ASSESSMENT: 63 y.o. female here for assessment of the following  1. Chronic ulcerative pancolitis with complication (HCC)   2. High risk medication use   3. Bloating   4. Autoimmune hepatitis (HCC)    On Rinvoq , she is tolerating it well for the most part.  Her fecal calprotectin is 5, she had no significant inflammation on her last colonoscopy.  Overall I think it is working well for her.  We discussed the frustration that she has had with her insurance over this and if it is really a problem and recurring issue we can consider other options albeit she may continue  to have issues with them pain for other medications as well.  In addition her hyperlipidemia has been a bit worse since being on this and on Crestor  now for management.  She is followed by her cardiologist.  We discussed that given these 2 issues if she wanted to discuss other options.  We did briefly review other options and risks benefits of each.  She really likes taking an oral regimen and would prefer to avoid infusions and injections if possible.  As such, she understands the risks of Rinvoq , but it is working quite well for her and she wants to continue it.  If there is anything we can do on our end to help with her insurance she will let me know.  Otherwise continue MiraLAX  but recommend she take it every daily to prevent constipation and subsequent bloating.  IBgard does help her when she takes it, I recommend she use it more frequently.  Gave her handout to see if she would want to try some FODZYME supplement to provide additional benefit for bloating.  If this really bothers her and she fails these measures, could consider trial of rifaximin, I am concerned about her ability to get that approved by insurance, would  have to consider weekly sample.  She wants to hold off on that for now.  Autoimmune hepatitis appears to be in remission for some time now.  We will continue to check labs every 6 months and will add an IgG level to her next blood draw.  Otherwise may consider colonoscopy within the next year or so.  I will see her in 6 months for reassessment and discussion further   PLAN: - continue Rinvoq  - discussed risks / benefits, doing well. Vaccines UTD - take Miralax  daily - continue IB gard, can use daily - FODZYME supplement handout - consideration for Rifaximin if bloating is worse - Sept labs - CBC, CMET, IgG level - f/u 6 months - consideration for colonoscopy within the next year  Christi Coward, MD [facility]

## 2023-12-02 ENCOUNTER — Ambulatory Visit (HOSPITAL_COMMUNITY)
Admission: RE | Admit: 2023-12-02 | Discharge: 2023-12-02 | Disposition: A | Discharge status: Home / Self Care | Source: Ambulatory Visit | Attending: Cardiology | Admitting: Cardiology

## 2023-12-02 DIAGNOSIS — I10 Essential (primary) hypertension: Secondary | ICD-10-CM | POA: Diagnosis not present

## 2023-12-03 ENCOUNTER — Ambulatory Visit: Payer: Self-pay | Admitting: Cardiology

## 2023-12-03 ENCOUNTER — Telehealth: Payer: Self-pay

## 2023-12-03 NOTE — Telephone Encounter (Signed)
 Pt contacted and advised. Pt reports understanding.

## 2023-12-03 NOTE — Telephone Encounter (Signed)
 She has had calcium  score scan before, and is on statin therapy.  Therefore, I do not think she needs another calcium  score scan.  Calcium  score is usually used only as screening for coronary artery disease.  Thanks MJP

## 2023-12-03 NOTE — Progress Notes (Signed)
 No narrowing in kidney arteries.  Thanks MJP

## 2023-12-03 NOTE — Telephone Encounter (Signed)
 Pt would like to know if she qualifies to have a CT calcium  score?

## 2023-12-06 ENCOUNTER — Ambulatory Visit (INDEPENDENT_AMBULATORY_CARE_PROVIDER_SITE_OTHER): Admitting: Podiatrist

## 2023-12-06 DIAGNOSIS — B351 Tinea unguium: Secondary | ICD-10-CM

## 2023-12-06 NOTE — Progress Notes (Signed)
 Patient presents today for the 8th laser treatment. Diagnosed with mycotic nail infection by Dr. Luster Salters.   Toenail most affected Right hallux , third toe right and 5th toenail right.-  nails have improved with laser therapy and she is happy with the progress.   All other systems are negative.  Due to past medical history she is unable to take lamisil   Nails were filed thin. Laser therapy was administered to 1-5 toenails right foot and patient tolerated the treatment well. All safety precautions were in place.   Single laser pass was done on non-affected nails.  She was using fungicure and her dermatologist  just rx'd jublia  for use on her toenails   Laser treatments are complete- recommended she use the Jublia  at home and if she feels like another laser may be beneficial, she can call and set up another visit for laser.

## 2023-12-17 ENCOUNTER — Telehealth: Payer: Self-pay | Admitting: Cardiology

## 2023-12-17 DIAGNOSIS — R072 Precordial pain: Secondary | ICD-10-CM

## 2023-12-17 NOTE — Telephone Encounter (Signed)
 Called patient back about her message. Patient stated that she had another episode of feeling like her chest is tight and she cannot catch her breath. She stated that this tightness radiates to her back. Patient stated these same symptoms happened 3 weeks ago. Patient would like to get testing done to ease her mind (labs, Ca Score CT, heart monitor etc ). Will send message to Dr. Filiberto Hug for advisement.

## 2023-12-17 NOTE — Telephone Encounter (Signed)
  Pt c/o of Chest Pain: STAT if active CP, including tightness, pressure, jaw pain, radiating pain to shoulder/upper arm/back, CP unrelieved by Nitro. Symptoms reported of SOB, nausea, vomiting, sweating.  1. Are you having CP right now?   No  2. Are you experiencing any other symptoms (ex. SOB, nausea, vomiting, sweating)?   No  3. Is your CP continuous or coming and going?   Coming and going  4. Have you taken Nitroglycerin ?  No  5. How long have you been experiencing CP?   Episode happened on Saturday  6. If NO CP at time of call then end call with telling Pt to call back or call 911 if Chest pain returns prior to return call from triage team.   Patient is concerned she had "chest tightness" over the weekend (Saturday).  Patient stated it is similar to an episode she had a couple of weeks ago but was not as severe.  Patient stated her BP was good.

## 2023-12-17 NOTE — Telephone Encounter (Signed)
 Recommend coronary CTA for precordial pain.  Thanks MJP

## 2023-12-18 ENCOUNTER — Other Ambulatory Visit (HOSPITAL_COMMUNITY): Payer: Self-pay

## 2023-12-18 MED ORDER — METOPROLOL TARTRATE 100 MG PO TABS
100.0000 mg | ORAL_TABLET | Freq: Once | ORAL | 0 refills | Status: DC
  Filled 2023-12-18: qty 1, 1d supply, fill #0

## 2023-12-18 NOTE — Addendum Note (Signed)
 Addended by: Miles Allan B on: 12/18/2023 02:16 PM   Modules accepted: Orders

## 2023-12-18 NOTE — Telephone Encounter (Signed)
 Pt contacted and aware of recommendations. Coronary CTA instructions sent to patient's MyChart.

## 2023-12-18 NOTE — Telephone Encounter (Signed)
 BMP order place and pat aware.

## 2023-12-20 ENCOUNTER — Ambulatory Visit (INDEPENDENT_AMBULATORY_CARE_PROVIDER_SITE_OTHER): Admitting: Family Medicine

## 2023-12-20 ENCOUNTER — Encounter: Payer: Self-pay | Admitting: Family Medicine

## 2023-12-20 ENCOUNTER — Other Ambulatory Visit (HOSPITAL_COMMUNITY): Payer: Self-pay

## 2023-12-20 VITALS — BP 132/86 | HR 81 | Temp 97.7°F | Ht 60.0 in | Wt 157.0 lb

## 2023-12-20 DIAGNOSIS — J329 Chronic sinusitis, unspecified: Secondary | ICD-10-CM | POA: Diagnosis not present

## 2023-12-20 DIAGNOSIS — H6993 Unspecified Eustachian tube disorder, bilateral: Secondary | ICD-10-CM | POA: Diagnosis not present

## 2023-12-20 DIAGNOSIS — J310 Chronic rhinitis: Secondary | ICD-10-CM

## 2023-12-20 MED ORDER — METOPROLOL TARTRATE 100 MG PO TABS: 100.0000 mg | ORAL_TABLET | Freq: Once | ORAL | 0 refills | Status: DC

## 2023-12-20 MED ORDER — AZITHROMYCIN 250 MG PO TABS: ORAL_TABLET | ORAL | 0 refills | Status: DC

## 2023-12-20 NOTE — Patient Instructions (Signed)
 It was very nice to see you today!  Please start the azithromycin  .  Let us  know if not for the next week.  Return if symptoms worsen or fail to improve.   Take care, Dr Daneil Dunker  PLEASE NOTE:  If you had any lab tests, please let us  know if you have not heard back within a few days. You may see your results on mychart before we have a chance to review them but we will give you a call once they are reviewed by us .   If we ordered any referrals today, please let us  know if you have not heard from their office within the next week.   If you had any urgent prescriptions sent in today, please check with the pharmacy within an hour of our visit to make sure the prescription was transmitted appropriately.   Please try these tips to maintain a healthy lifestyle:  Eat at least 3 REAL meals and 1-2 snacks per day.  Aim for no more than 5 hours between eating.  If you eat breakfast, please do so within one hour of getting up.   Each meal should contain half fruits/vegetables, one quarter protein, and one quarter carbs (no bigger than a computer mouse)  Cut down on sweet beverages. This includes juice, soda, and sweet tea.   Drink at least 1 glass of water with each meal and aim for at least 8 glasses per day  Exercise at least 150 minutes every week.

## 2023-12-20 NOTE — Telephone Encounter (Signed)
 Returned call to patient. Lopressor  100 mg for cardiac CT was sent to [facility]. Patient [facility] on [address]. This has been completed. Patient verbalized understanding and expressed appreciation for call.

## 2023-12-20 NOTE — Addendum Note (Signed)
 Addended by: Luwana Salvo on: 12/20/2023 03:01 PM   Modules accepted: Orders

## 2023-12-20 NOTE — Assessment & Plan Note (Signed)
 Likely contributing to left sinus issues.  She will continue her current regimen with Flonase , Claritin, and saline nasal rinses.

## 2023-12-20 NOTE — Assessment & Plan Note (Signed)
 Continue per ENT.  Does have some erythema in the right TM today.  Will start azithromycin  as above for sinusitis.

## 2023-12-20 NOTE — Telephone Encounter (Signed)
 Pt requesting a c/b from [facility]. She states the medication she needs before having her CT wasn't at the pharmacy.

## 2023-12-20 NOTE — Progress Notes (Signed)
   Danielle Bond is a 63 y.o. female who presents today for an office visit.  Assessment/Plan:  New/Acute Problems: Sinusitis  No red flags.  Given length of symptoms would be reasonable for us  to start antibiotics at this point.  Will start azithromycin .  She has done well with this in the past.  Encouraged hydration.  She will let us  know if not improving.  Chronic Problems Addressed Today: Chronic rhinitis Likely contributing to left sinus issues.  She will continue her current regimen with Flonase , Claritin, and saline nasal rinses.  Eustachian tube dysfunction Continue per ENT.  Does have some erythema in the right TM today.  Will start azithromycin  as above for sinusitis.  Hypertension  Blood pressure at goal today.  She has been monitoring at home since her episode a few weeks ago and it has continued to be normal.  Unclear etiology for her episode a few weeks ago though will defer further management to cardiology.    Subjective:  HPI:  See assessment / plan for status of chronic conditions. Patient here with sinus congestion and pressure for the last couple of weeks. Tried using sinus rinse with some improvement. She is getting occasional chills. No fevers. She is having some cough. She is having some irritation in her throat.   She had an episode a few weeks ago with shortness of breath and clamminess. This happened while she was at home cleaning around the house. EMS was called and she was found to have elevated BP reading into the 230s. They recommend that she go to the Emergency Department however she declined. She did follow up with cardiology after this and they ordered a renal artery ultrasound. This was normal.        Objective:  Physical Exam: BP 132/86   Pulse 81   Temp 97.7 F (36.5 C) (Temporal)   Ht 5' (1.524 m)   Wt 157 lb (71.2 kg)   LMP 03/03/2013   SpO2 97%   BMI 30.66 kg/m   Gen: No acute distress, resting comfortably HEENT: Left TM clear.  Right  TM erythematous.  Nose mucosa erythematous and boggy bilaterally with clear discharge.  OP erythematous. CV: Regular rate and rhythm with no murmurs appreciated Pulm: Normal work of breathing, clear to auscultation bilaterally with no crackles, wheezes, or rhonchi Neuro: Grossly normal, moves all extremities Psych: Normal affect and thought content      Danielle Bond M. Daneil Dunker, MD 12/20/2023 9:45 AM

## 2023-12-23 ENCOUNTER — Telehealth: Payer: Self-pay

## 2023-12-23 DIAGNOSIS — M9904 Segmental and somatic dysfunction of sacral region: Secondary | ICD-10-CM | POA: Diagnosis not present

## 2023-12-23 DIAGNOSIS — M5136 Other intervertebral disc degeneration, lumbar region with discogenic back pain only: Secondary | ICD-10-CM | POA: Diagnosis not present

## 2023-12-23 DIAGNOSIS — M9905 Segmental and somatic dysfunction of pelvic region: Secondary | ICD-10-CM | POA: Diagnosis not present

## 2023-12-23 DIAGNOSIS — K754 Autoimmune hepatitis: Secondary | ICD-10-CM

## 2023-12-23 DIAGNOSIS — K51019 Ulcerative (chronic) pancolitis with unspecified complications: Secondary | ICD-10-CM

## 2023-12-23 DIAGNOSIS — M9903 Segmental and somatic dysfunction of lumbar region: Secondary | ICD-10-CM | POA: Diagnosis not present

## 2023-12-23 NOTE — Telephone Encounter (Signed)
 Lab orders put in.  Lm on vm for patient to call back

## 2023-12-23 NOTE — Telephone Encounter (Signed)
 Patient called and stated that she was returning a call back to Danielle Bond and she will be home all day. Patient is requesting a call back. Please advise.

## 2023-12-23 NOTE — Telephone Encounter (Signed)
 Put lab orders in.  Lm on vm

## 2023-12-23 NOTE — Telephone Encounter (Signed)
-----   Message from [facility] sent at 07/02/2023  9:13 AM EST ----- Regarding: labs CBC, CMET, and IgG due in another 6 months (June 2025)

## 2023-12-23 NOTE — Telephone Encounter (Signed)
 Spoke to patient and let her know it was time to come in for follow up labs.  The orders are in and patient knows to come by this week to have them drawn

## 2023-12-23 NOTE — Telephone Encounter (Signed)
 Patient will come in tomorrow to have labs drawn.

## 2023-12-24 ENCOUNTER — Other Ambulatory Visit: Payer: Self-pay | Admitting: Family Medicine

## 2023-12-24 ENCOUNTER — Other Ambulatory Visit: Payer: Self-pay | Admitting: *Deleted

## 2023-12-24 NOTE — Telephone Encounter (Signed)
 Please advise  Last refill synthroid  100mcg

## 2023-12-24 NOTE — Telephone Encounter (Signed)
 Can we please clarify dose with patient?

## 2023-12-25 DIAGNOSIS — R072 Precordial pain: Secondary | ICD-10-CM | POA: Diagnosis not present

## 2023-12-26 ENCOUNTER — Other Ambulatory Visit (INDEPENDENT_AMBULATORY_CARE_PROVIDER_SITE_OTHER)

## 2023-12-26 DIAGNOSIS — K51019 Ulcerative (chronic) pancolitis with unspecified complications: Secondary | ICD-10-CM

## 2023-12-26 DIAGNOSIS — K754 Autoimmune hepatitis: Secondary | ICD-10-CM

## 2023-12-26 LAB — COMPREHENSIVE METABOLIC PANEL WITH GFR
ALT: 27 U/L (ref 0–35)
AST: 35 U/L (ref 0–37)
Albumin: 4.5 g/dL (ref 3.5–5.2)
Alkaline Phosphatase: 59 U/L (ref 39–117)
BUN: 14 mg/dL (ref 6–23)
CO2: 24 meq/L (ref 19–32)
Calcium: 9.6 mg/dL (ref 8.4–10.5)
Chloride: 105 meq/L (ref 96–112)
Creatinine, Ser: 0.8 mg/dL (ref 0.40–1.20)
GFR: 78.57 mL/min (ref >60.00)
Glucose, Bld: 105 mg/dL — ABNORMAL HIGH (ref 70–99)
Potassium: 4 meq/L (ref 3.5–5.1)
Sodium: 140 meq/L (ref 135–145)
Total Bilirubin: 0.8 mg/dL (ref 0.2–1.2)
Total Protein: 7.9 g/dL (ref 6.0–8.3)

## 2023-12-26 LAB — BASIC METABOLIC PANEL WITH GFR
BUN/Creatinine Ratio: 19 (ref 12–28)
BUN: 16 mg/dL (ref 8–27)
CO2: 21 mmol/L (ref 20–29)
Calcium: 9.5 mg/dL (ref 8.7–10.3)
Chloride: 104 mmol/L (ref 96–106)
Creatinine, Ser: 0.86 mg/dL (ref 0.57–1.00)
Glucose: 135 mg/dL — ABNORMAL HIGH (ref 70–99)
Potassium: 4.4 mmol/L (ref 3.5–5.2)
Sodium: 142 mmol/L (ref 134–144)
eGFR: 76 mL/min/{1.73_m2} (ref >59)

## 2023-12-26 LAB — CBC WITH DIFFERENTIAL/PLATELET
Basophils Absolute: 0 10*3/uL (ref 0.0–0.1)
Basophils Relative: 0.5 % (ref 0.0–3.0)
Eosinophils Absolute: 0.1 10*3/uL (ref 0.0–0.7)
Eosinophils Relative: 1.9 % (ref 0.0–5.0)
HCT: 38.1 % (ref 36.0–46.0)
Hemoglobin: 12.7 g/dL (ref 12.0–15.0)
Lymphocytes Relative: 32.5 % (ref 12.0–46.0)
Lymphs Abs: 1.6 10*3/uL (ref 0.7–4.0)
MCHC: 33.4 g/dL (ref 30.0–36.0)
MCV: 88.6 fl (ref 78.0–100.0)
Monocytes Absolute: 0.7 10*3/uL (ref 0.1–1.0)
Monocytes Relative: 13.5 % — ABNORMAL HIGH (ref 3.0–12.0)
Neutro Abs: 2.6 10*3/uL (ref 1.4–7.7)
Neutrophils Relative %: 51.6 % (ref 43.0–77.0)
Platelets: 310 10*3/uL (ref 150.0–400.0)
RBC: 4.3 Mil/uL (ref 3.87–5.11)
RDW: 13.5 % (ref 11.5–15.5)
WBC: 5 10*3/uL (ref 4.0–10.5)

## 2023-12-27 LAB — IGG: IgG (Immunoglobin G), Serum: 1576 mg/dL — ABNORMAL HIGH (ref 600–1540)

## 2023-12-28 ENCOUNTER — Ambulatory Visit: Payer: Self-pay | Admitting: Gastroenterology

## 2023-12-30 DIAGNOSIS — M9904 Segmental and somatic dysfunction of sacral region: Secondary | ICD-10-CM | POA: Diagnosis not present

## 2023-12-30 DIAGNOSIS — M5136 Other intervertebral disc degeneration, lumbar region with discogenic back pain only: Secondary | ICD-10-CM | POA: Diagnosis not present

## 2023-12-30 DIAGNOSIS — M9903 Segmental and somatic dysfunction of lumbar region: Secondary | ICD-10-CM | POA: Diagnosis not present

## 2023-12-30 DIAGNOSIS — M9905 Segmental and somatic dysfunction of pelvic region: Secondary | ICD-10-CM | POA: Diagnosis not present

## 2023-12-30 NOTE — Telephone Encounter (Signed)
 I spoke with the pt and advised her that Dr Filiberto Hug is still reviewing her labwork for her CT... we went over her CT instructions.. she was confused about the instructions including Prednisone  and erectile dysfunction and reassured her that those parts are not relevant to her and to disregard.. we went over her part of the instructions and she was thankful for the information.

## 2023-12-30 NOTE — Telephone Encounter (Signed)
 Patient wants a call back regarding her lab test results and to clarify instructions for CT Morphology test.

## 2023-12-31 ENCOUNTER — Encounter (HOSPITAL_COMMUNITY): Payer: Self-pay

## 2024-01-01 NOTE — Telephone Encounter (Signed)
 Called and spoke with patient, she expressed frustration with the process surrounding her upcoming CT scan.   Patient states she was told she would be called 2 days prior to her CT scan to review her allergies and medications. She states she never received this call and her CT scan is tomorrow 6/12. She also expressed frustration over initially being given the wrong address of test location and confusion having generic information on test instructions including ED medications.  Patient also expressed frustration with experience when speaking with operator today. She states she was asked for her name and date of birth and was asked to spell her last name, then states she told the operator it's spelled the same way that it sounds Wild[place]man and states the operator spoke to her in a rude tone. She expressed she is experiencing a lot of anxiety anticipating this upcoming scan and became tearful over the phone when speaking about experience with the operator earlier today.  Apologized for the experience patient has had with this and was cut off by patient stating she is tired of people apologizing for others. She states she wanted us  to be aware of what happened.  Will forward message to supervisor.  Reviewed CT test location, date, time and medications. Patient expressed appreciation for follow-up and states she feels better about upcoming test now.

## 2024-01-01 NOTE — Telephone Encounter (Signed)
 Pt is requesting a callback regarding her wanting to know how the CT will go tomorrow, her allergies and history before having it done as well. Please advise.

## 2024-01-02 ENCOUNTER — Ambulatory Visit (HOSPITAL_COMMUNITY)
Admission: RE | Admit: 2024-01-02 | Discharge: 2024-01-02 | Disposition: A | Discharge status: Home / Self Care | Source: Ambulatory Visit | Attending: Cardiology | Admitting: Cardiology

## 2024-01-02 DIAGNOSIS — I2510 Atherosclerotic heart disease of native coronary artery without angina pectoris: Secondary | ICD-10-CM | POA: Insufficient documentation

## 2024-01-02 DIAGNOSIS — R072 Precordial pain: Secondary | ICD-10-CM

## 2024-01-02 MED ORDER — IOHEXOL 350 MG/ML SOLN
100.0000 mL | Freq: Once | INTRAVENOUS | Status: AC | PRN
  Administered 2024-01-02: 100 mL via INTRAVENOUS

## 2024-01-02 MED ORDER — DILTIAZEM HCL 25 MG/5ML IV SOLN
10.0000 mg | INTRAVENOUS | Status: DC | PRN
Start: 2024-01-02 — End: 2024-01-03

## 2024-01-02 MED ORDER — NITROGLYCERIN 0.4 MG SL SUBL
0.8000 mg | SUBLINGUAL_TABLET | Freq: Once | SUBLINGUAL | Status: AC
  Administered 2024-01-02: 0.8 mg via SUBLINGUAL

## 2024-01-02 MED ORDER — METOPROLOL TARTRATE 5 MG/5ML IV SOLN: 10.0000 mg | Freq: Once | INTRAVENOUS | Status: DC | PRN

## 2024-01-03 ENCOUNTER — Ambulatory Visit: Payer: Self-pay | Admitting: Cardiology

## 2024-01-03 NOTE — Progress Notes (Signed)
 Moderate amount of calcium  noted in the heart arteries, but no significant narrowing.  LDL goal <70 even though HDL is high.  Consider increasing Crestor  to 20 mg daily, continue Zetia  10 mg daily.  Thanks MJP

## 2024-01-07 ENCOUNTER — Telehealth: Payer: Self-pay | Admitting: Cardiology

## 2024-01-07 MED ORDER — ROSUVASTATIN CALCIUM 20 MG PO TABS: 20.0000 mg | ORAL_TABLET | Freq: Every day | ORAL | 3 refills | Status: DC

## 2024-01-07 NOTE — Telephone Encounter (Signed)
 Follow Up:     Patient is retuning Katie's call from today, concerning her results.

## 2024-01-07 NOTE — Telephone Encounter (Signed)
 Follow Up:     Patient says she have My- Chart, but she does not use it. She would like to know if she can have someone to please call and tell her the results and explain it to her please.

## 2024-01-07 NOTE — Telephone Encounter (Signed)
 Cody Das, MD 01/03/2024  4:49 PM EDT     Moderate amount of calcium  noted in the heart arteries, but no significant narrowing.  LDL goal <70 even though HDL is high.  Consider increasing Crestor  to 20 mg daily, continue Zetia  10 mg daily.   Thanks MJP    Left the information above over the answering machine. Sent in a prescription for Crestor  20 mg to preferred pharmacy. Left call back number if any questions/concerns.   Also send a MyChart message with further explanation.

## 2024-01-07 NOTE — Telephone Encounter (Signed)
 Left message to call back

## 2024-01-07 NOTE — Telephone Encounter (Signed)
 Call transferred to RN.

## 2024-01-07 NOTE — Telephone Encounter (Signed)
Please review result note.

## 2024-01-08 NOTE — Progress Notes (Signed)
 Noted.  Thanks MJP

## 2024-01-10 NOTE — Progress Notes (Signed)
 Less likely. Typically numbers are much higher with familial hypercholesterolemia.  Thanks MJP

## 2024-01-13 ENCOUNTER — Other Ambulatory Visit: Payer: Self-pay | Admitting: *Deleted

## 2024-01-13 ENCOUNTER — Other Ambulatory Visit: Payer: Self-pay | Admitting: Family Medicine

## 2024-01-13 NOTE — Telephone Encounter (Signed)
**Note De-identified  [place] Obfuscation** Please advise 

## 2024-01-14 ENCOUNTER — Other Ambulatory Visit: Payer: Self-pay | Admitting: Family Medicine

## 2024-01-14 ENCOUNTER — Ambulatory Visit: Payer: Self-pay

## 2024-01-14 ENCOUNTER — Telehealth: Payer: Self-pay | Admitting: *Deleted

## 2024-01-14 ENCOUNTER — Other Ambulatory Visit: Payer: Self-pay | Admitting: *Deleted

## 2024-01-14 MED ORDER — LEVOTHYROXINE SODIUM 100 MCG PO TABS: 100.0000 ug | ORAL_TABLET | Freq: Every day | ORAL | 3 refills | Status: DC

## 2024-01-14 NOTE — Telephone Encounter (Signed)
 Copied from CRM [id number]. Topic: Clinical - Prescription Issue >> Jan 14, 2024 10:09 AM Drema MATSU wrote: Reason for CRM: Patient went to pick up medication from pharmacy yesterday and discovered that she was given levothyroxine  (SYNTHROID ) 25 MCG tablet and it was suppose to be levothyroxine  (SYNTHROID ) 100 MCG tablet. Patient stated that this was a mistake and is requesting a callback from nurse in regards to this and other matters. She does not have anymore medication left.  Please advise on correct dose  Alfredo Collymore,RMA

## 2024-01-14 NOTE — Telephone Encounter (Signed)
 Please send in prescription for synthroid  100mcg daily.  Please remove the 25 mcg daily off of her list with us  and recommend that she contact pharmacy to have it removed off their list as well.

## 2024-01-14 NOTE — Telephone Encounter (Signed)
 FYI Only or Action Required?: Action required by provider: clinical question for provider.  Patient was last seen in primary care on 12/20/2023 by Kennyth Worth HERO, MD. Called Nurse Triage reporting No chief complaint on file.. Symptoms began n/a. Interventions attempted: Other: n/a. S  Triage Disposition: No disposition on file. Lab Appointment scheduled   Patient/caregiver understands and will follow disposition?: yes  ----------------------------------------------------------    Patient requesting a Lab appointment. Per patient, PCP requested that she checked her cholesterol levels.    Lab appointment scheduled.  No lab order noted, message routed for order placement.

## 2024-01-14 NOTE — Telephone Encounter (Signed)
 FYI Only or Action Required?: Action required by provider: medication refill request.  Patient was last seen in primary care on 12/20/2023 by Kennyth Worth HERO, MD. Called Nurse Triage reporting No chief complaint on file.. Symptoms began today. Interventions attempted: Nothing. Symptoms are: stable.  Triage Disposition: No disposition on file. Please see note!   Patient/caregiver understands and will follow disposition?: yes       Copied from CRM [id number]. Topic: Clinical - Prescription Issue >> Jan 14, 2024  3:40 PM Turkey A wrote: Reason for CRM: Patient is upset she has not received call back from a nurse regarding Syntoroid. There is also a message from provider Answer Assessment - Initial Assessment Questions 1. REASON FOR CALL or QUESTION:     Patient calling due to an error in prescription discrepancy for Levothyroxine . Patient reports the new prescription that was sent today was incorrect and due to this, insurance will charge her more.  Patient requesting that the order to stay consistent with what it was prior:  Dose: Instruction: Take 6 days a week and skip a day.  Disp Quantity:  30   Patient reports she has NO medication left, and is requesting that this be addressed soon.   CAL line called and message sent to office accordingly.  Protocols used: Information Only Call - No Triage-A-AH

## 2024-01-15 ENCOUNTER — Telehealth: Payer: Self-pay | Admitting: Family Medicine

## 2024-01-15 ENCOUNTER — Telehealth: Payer: Self-pay | Admitting: *Deleted

## 2024-01-15 ENCOUNTER — Other Ambulatory Visit: Payer: Self-pay | Admitting: *Deleted

## 2024-01-15 MED ORDER — LEVOTHYROXINE SODIUM 100 MCG PO TABS: 100.0000 ug | ORAL_TABLET | Freq: Every day | ORAL | 3 refills | Status: DC

## 2024-01-15 NOTE — Telephone Encounter (Signed)
 New prescription send to [facility]

## 2024-01-15 NOTE — Telephone Encounter (Signed)
 Patient requesting. US  of thyroid , last one was done 3 years ago  Please advise

## 2024-01-15 NOTE — Telephone Encounter (Signed)
**Note De-identified  [place] Obfuscation** Please advise 

## 2024-01-15 NOTE — Telephone Encounter (Signed)
 Pt requesting lab order for lipid panel. See note. Thanks! Janie Allegra, RN    01/14/24  4:25 PM Note FYI Only or Action Required?: Action required by provider: clinical question for provider.   Patient was last seen in primary care on 12/20/2023 by Kennyth Worth HERO, MD. Called Nurse Triage reporting No chief complaint on file.. Symptoms began n/a. Interventions attempted: Other: n/a. S   Triage Disposition: No disposition on file. Lab Appointment scheduled    Patient/caregiver understands and will follow disposition?: yes   ----------------------------------------------------------       Patient requesting a Lab appointment. Per patient, PCP requested that she checked her cholesterol levels.       Lab appointment scheduled.   No lab order noted, message routed for order placem

## 2024-01-15 NOTE — Telephone Encounter (Signed)
 Patient Husband  Danielle Bond will like to be your patient  Please advise

## 2024-01-16 ENCOUNTER — Other Ambulatory Visit: Payer: Self-pay | Admitting: *Deleted

## 2024-01-16 DIAGNOSIS — E785 Hyperlipidemia, unspecified: Secondary | ICD-10-CM

## 2024-01-16 DIAGNOSIS — E039 Hypothyroidism, unspecified: Secondary | ICD-10-CM

## 2024-01-16 NOTE — Telephone Encounter (Signed)
 Ok with me. Please place any necessary orders.

## 2024-01-16 NOTE — Telephone Encounter (Signed)
 LVM Husband can give us  a call to schedule a New patient appt ok per Dr Kennyth

## 2024-01-16 NOTE — Telephone Encounter (Signed)
 That is ok to schedule as a new patient.  Worth HERO. Kennyth, MD 01/16/2024 7:42 AM

## 2024-01-16 NOTE — Telephone Encounter (Signed)
 Lab order placed.

## 2024-01-16 NOTE — Telephone Encounter (Signed)
US thyroid placed.

## 2024-01-17 ENCOUNTER — Other Ambulatory Visit: Payer: Self-pay | Admitting: *Deleted

## 2024-01-17 MED ORDER — LEVOTHYROXINE SODIUM 100 MCG PO TABS: 100.0000 ug | ORAL_TABLET | Freq: Every day | ORAL | 3 refills | Status: AC

## 2024-01-17 NOTE — Telephone Encounter (Signed)
 Copied from CRM [id number]. Topic: General - Other >> Jan 16, 2024  4:40 PM Sophia H wrote: Reason for CRM: Patient is calling in to speak with Elora, only wants to speak with her. States she is aware of what is going on with her medication Syntoroid, she is still going back and forth with the pharmacy.  [phone]   Resend Rx with brand name only Rx Synthroid   Theodoros Stjames,RMA

## 2024-01-20 ENCOUNTER — Other Ambulatory Visit: Payer: Self-pay | Admitting: Dermatology

## 2024-01-20 ENCOUNTER — Telehealth: Payer: Self-pay | Admitting: Cardiology

## 2024-01-20 DIAGNOSIS — M5136 Other intervertebral disc degeneration, lumbar region with discogenic back pain only: Secondary | ICD-10-CM | POA: Diagnosis not present

## 2024-01-20 DIAGNOSIS — M9904 Segmental and somatic dysfunction of sacral region: Secondary | ICD-10-CM | POA: Diagnosis not present

## 2024-01-20 DIAGNOSIS — M9905 Segmental and somatic dysfunction of pelvic region: Secondary | ICD-10-CM | POA: Diagnosis not present

## 2024-01-20 DIAGNOSIS — M9903 Segmental and somatic dysfunction of lumbar region: Secondary | ICD-10-CM | POA: Diagnosis not present

## 2024-01-20 MED ORDER — JUBLIA 10 % EX SOLN: 1.0000 | Freq: Every day | CUTANEOUS | 5 refills | Status: AC

## 2024-01-20 NOTE — Telephone Encounter (Signed)
 Patient is returning phone call in regard to CT Coronary Morph results. Patient stated she will have her labs completed tomorrow at 10 AM. Please advise.

## 2024-01-21 ENCOUNTER — Other Ambulatory Visit (INDEPENDENT_AMBULATORY_CARE_PROVIDER_SITE_OTHER)

## 2024-01-21 DIAGNOSIS — E785 Hyperlipidemia, unspecified: Secondary | ICD-10-CM

## 2024-01-21 LAB — LIPID PANEL
Cholesterol: 208 mg/dL — ABNORMAL HIGH (ref 0–200)
HDL: 80.8 mg/dL (ref >39.00)
LDL Cholesterol: 99 mg/dL (ref 0–99)
NonHDL: 127.19
Total CHOL/HDL Ratio: 3
Triglycerides: 141 mg/dL (ref 0.0–149.0)
VLDL: 28.2 mg/dL (ref 0.0–40.0)

## 2024-01-21 NOTE — Telephone Encounter (Signed)
Review result note

## 2024-01-22 ENCOUNTER — Ambulatory Visit: Payer: Self-pay | Admitting: Family Medicine

## 2024-01-22 ENCOUNTER — Other Ambulatory Visit

## 2024-01-22 NOTE — Progress Notes (Signed)
 Her cholesterol is similar to the previous values we checked a few months ago.  It looks like cardiology would like for her LDL to be less than 70 and increased her Crestor  to 20 mg daily.  Can we verify if she has been taking this?

## 2024-01-28 ENCOUNTER — Telehealth: Payer: Self-pay | Admitting: *Deleted

## 2024-01-28 ENCOUNTER — Telehealth: Payer: Self-pay | Admitting: Gastroenterology

## 2024-01-28 ENCOUNTER — Telehealth: Payer: Self-pay | Admitting: Cardiology

## 2024-01-28 DIAGNOSIS — K51019 Ulcerative (chronic) pancolitis with unspecified complications: Secondary | ICD-10-CM

## 2024-01-28 DIAGNOSIS — E782 Mixed hyperlipidemia: Secondary | ICD-10-CM

## 2024-01-28 NOTE — Telephone Encounter (Signed)
 Inbound call from patient, states she believes her Rinvoq  is causing her cholesterol to go up. She states her cardiologist states they are debating putting her on two different cholesterol medications but patient would like to discuss if Rinvoq  is causing it.

## 2024-01-28 NOTE — Telephone Encounter (Signed)
 Copied from CRM [id number]. Topic: Clinical - Prescription Issue >> Jan 28, 2024  2:40 PM Danielle Bond wrote: Reason for CRM: Patient would like for Danielle Bond to give a callback after speaking with cardiologist. Please call [phone]  See result notes, already spoke to pt.

## 2024-01-28 NOTE — Telephone Encounter (Signed)
 New Message:    Patient says she needs Danielle Bond to please call her. She said they need to discuss the next step for her Cholesterol.

## 2024-01-28 NOTE — Telephone Encounter (Signed)
 Spoke to patient and she advise that lipid panel has resulted by her PCP and it has worsened. Pt reports that she has been taking Rinvoq  for the last several months and she advises that there is a side effect of the medication causing increase in cholesterol levels. Pt is going to touch base with GI to see if this medication can be changed. Pt will contact the office to advise what GI is recommending.

## 2024-01-28 NOTE — Telephone Encounter (Signed)
 Noted.  Thanks MJP

## 2024-01-28 NOTE — Telephone Encounter (Signed)
 To answer her question, yes Rinvoq  can increase lipids, typically triglyceride levels however her cholesterol levels are pretty stable over the past several years.  Her LDL is lower than previous.  While the Rinvoq  could potentially be elevating her triglycerides a little bit, I would recommend she stay on this given it is controlling her IBD and up to her in discussion with her PCP if therapy is needed.  Again I am seeing her lipid profile is fairly similar pretherapy with exception of the triglycerides which seems slightly higher.   Cholesterol 208 High  207 High  CM 210 High  CM 258 High  CM 244 High  CM 209 High  CM   Comment: ATP III Classification       Desirable:  < 200 mg/dL               Borderline High:  200 - 239 mg/dL          High:  > = 759 mg/dL  Triglycerides 858.9 19.9 CM 110.0 CM 96.0 CM 80.0 CM 78.0 CM 82 R  Comment: Normal:  <150 mg/dLBorderline High:  150 - 199 mg/dL  HDL 19.19 899.79 17.69 96.50 92.60 93.00 63 R  VLDL 28.2 16.0 22.0 19.2 16.0 15.6   LDL Cholesterol 99 90 106 High  142 High  136 High  100 High    Total CHOL/HDL Ratio 3 2 CM 3 CM 3 CM 3 CM 2 CM

## 2024-01-28 NOTE — Telephone Encounter (Signed)
 Dr Leigh-  Please advise... is worsening hyperlipidemia a side effect of Rinvoq ? I see in your last office note some mention of patient concern that Rinvoq  was causing this but it was ultimately decided that she was doing well from IBD standpoint and would continue Rinvoq . Patient is calling back now to discuss if Rinvoq  is the cause of the elevation.

## 2024-01-29 NOTE — Telephone Encounter (Signed)
 Patient spoke GI doctor and the Rinvoq  could be causing increase in triglycerides. GI to send notes for review and at this time GI does not want stop the use of Rinvoq . Pt has not been on Zetia  and has only been taking Crestor  10 mg.   Do you want to start Zetia  and recheck lipid in 3 months?

## 2024-01-29 NOTE — Telephone Encounter (Signed)
 Spoke to patient to advise of Dr Hassan response regarding the possibility of Rinvoq  increasing triglyceride levels and advised that he would recommend continuing Rinvoq  if at all possible since it has been controlling her IBD very well.   Patient states he cardiologist is very concerned that the Rinvoq  has caused significant elevation of triglycerides in comparison to pretherapy levels and asked to to reach out to GI.  Advised patient I will send a copy of this correspondence to cardiology for their review.    Dr A- Also of note, patient wants you to be aware that she continues to having burning, twisting in her left intestines that has been occurring since last office visit. Discomfort worsens at night before bed and is not relieved with altering miralax  dosing or other medications.

## 2024-01-29 NOTE — Addendum Note (Signed)
 Addended by: CLAUDENE NAOMIE SAILOR on: 01/29/2024 03:15 PM   Modules accepted: Orders

## 2024-01-29 NOTE — Telephone Encounter (Signed)
 Left message for patient to call back.  Order placed in EPIC for fecal calprotectin.

## 2024-01-29 NOTE — Telephone Encounter (Signed)
 Left message to call back

## 2024-01-29 NOTE — Telephone Encounter (Signed)
Yes please.  Thanks MJP  

## 2024-01-29 NOTE — Telephone Encounter (Signed)
 Patient returned RN's call regarding next steps with medication.

## 2024-01-29 NOTE — Telephone Encounter (Signed)
 Hi Dr. Elmira -I have this patient on Rinvoq  for her IBD and it has worked pretty well to control her disease.  She has failed some other regimens in the past and wanted to avoid infusions.  I do think it is likely increasing her triglyceride levels.  If you feel strongly that she needs to stop it we can but her preference was to continue this if possible given it has been working well.  Dottie can you let the patient know to go to the lab for fecal calprotectin to assess for inflammation in her colon in light of her symptoms.  Thanks

## 2024-01-30 NOTE — Telephone Encounter (Signed)
 Spoke with pt regarding Dr. Gilda suggestion to start Zetia  10 mg and lipids in 3 months. Pt was confused because she said she discussed an increase in Crestor . Pt would like to speak with Lyle as she knows the situation. Pt was notified that she not in the office but that her concerns would be forwarded to [facility] for assistance when she returns. Pt verbalized understanding. All questions if any were answered.

## 2024-01-30 NOTE — Telephone Encounter (Signed)
 Pt didn't have power yesterday and would like a c/b today

## 2024-01-30 NOTE — Telephone Encounter (Signed)
 Left message for patient to call back

## 2024-01-31 ENCOUNTER — Other Ambulatory Visit: Payer: Self-pay | Admitting: Cardiology

## 2024-01-31 MED ORDER — ROSUVASTATIN CALCIUM 10 MG PO TABS: 10.0000 mg | ORAL_TABLET | Freq: Two times a day (BID) | ORAL | 3 refills | Status: DC

## 2024-01-31 NOTE — Telephone Encounter (Signed)
 Contacted patient and advised. Pt would like to hold off on the Zetia  and just increase the Crestor  to 10 mg twice daily. Pt reports that her stomach can tolerate Crestor  20 mg when split to take 10 mg in the morning and 10 mg in the afternoon.

## 2024-01-31 NOTE — Telephone Encounter (Signed)
 Patient advised she should come to our lab to pick up stool kit for fecal calprotectin for further evaluation of the burning/twisting sensation in her left abdomen. She verbalizes understanding of this information.

## 2024-01-31 NOTE — Telephone Encounter (Signed)
 Prescription sent and order for labs placed. Pt contacted and advised.

## 2024-01-31 NOTE — Telephone Encounter (Signed)
 Okay with me.  Thanks MJP

## 2024-01-31 NOTE — Telephone Encounter (Signed)
 Contacted pharmacy and advised that this correct due to patient having stomach irritation when when take one full tablet a day of 20 mg.

## 2024-02-03 ENCOUNTER — Other Ambulatory Visit

## 2024-02-06 ENCOUNTER — Encounter: Payer: Self-pay | Admitting: Physical Medicine and Rehabilitation

## 2024-02-06 ENCOUNTER — Ambulatory Visit: Admitting: Physical Medicine and Rehabilitation

## 2024-02-06 DIAGNOSIS — M545 Low back pain, unspecified: Secondary | ICD-10-CM | POA: Diagnosis not present

## 2024-02-06 DIAGNOSIS — M7918 Myalgia, other site: Secondary | ICD-10-CM | POA: Diagnosis not present

## 2024-02-06 NOTE — Progress Notes (Signed)
 Danielle Bond - 63 y.o. female MRN [id number]  Date of birth: Jan 26, 1961  Office Visit Note: Visit Date: 02/06/2024 PCP: Kennyth Worth HERO, MD Referred by: Kennyth Worth HERO, MD  Subjective: Chief Complaint  Patient presents with   Lower Back - Pain   HPI: Danielle Bond is a 63 y.o. female who comes in today for evaluation of acute on chronic left sided lower back pain. States pain started suddenly yesterday, also reports feeling of numbness and tingling down entire left leg. She reports doing well until recently. Her pain worsens with standing and laying down. She describes pain as sore and aching sensation, currently rates as 10 out of 10. Some relief of pain with home exercise regimen, rest and use of medications. She is currently attending chiropractic treatments and massage therapy. She is working with chiropractor at [facility]  Lumbar MRI imaging from 2024 shows mild degenerative changes. No significant nerve root impingement. No high grade spinal canal stenosis.  She has undergone multiple right sacroiliac joint injections in our office, most recent was 11/22/2022, she reports intermittent relief of pain with these injections, lasting about 3 weeks. She does not feel SI joint injections were particularly beneficial in alleviating her pain. She is not currently being treated by rheumatology. Patient denies focal weakness, numbness and tingling. No recent trauma or falls.   Patient's course is complicated by ulcerative colitis, autoimmune hepatitis, arthralgia and osteoporosis.         Review of Systems  Musculoskeletal:  Positive for back pain and myalgias.  Neurological:  Negative for tingling, sensory change, focal weakness and weakness.  All other systems reviewed and are negative.  Otherwise per HPI.  Assessment & Plan: Visit Diagnoses:    ICD-10-CM   1. Acute left-sided low back pain without sciatica  M54.50     2. Myofascial pain syndrome  M79.18        Plan:  Findings:  Acute on chronic left sided lower back pain. No radicular symptoms down the legs. Patient continues to have severe pain despite good conservative therapies such as chiropractic treatments, massage therapy, home exercise regimen, rest and use of medications. Patients clinical presentation and exam are consistent with myofascial pain syndrome, more of a lumbar myofascial strain as her symptoms started 1 day ago. There is palpable myofascial trigger point noted to left lumbar paraspinal region upon palpation today. Lumbar MRI imaging from 2024 shows mild degenerative changes, no severe nerve impingement. We discussed treatment plan in detail today. She would like to re-group with chiropractor for dry needling. I think dry needling would be a good option. Could also look at trigger point injection here in the office should her pain persist. Her pain does not fit with classic sacroiliac pain, her discomfort seems to be above the belt line. I encouraged her to remain active as tolerated and to let us  know how she is doing. She has no questions at this time. No red flag symptoms noted upon exam today.    I spent more than 30 minutes speaking face-to-face with the patient with 50% of the time in counseling and discussing coordination of care.     Meds & Orders: No orders of the defined types were placed in this encounter.  No orders of the defined types were placed in this encounter.   Follow-up: Return if symptoms worsen or fail to improve.   Procedures: No procedures performed      Clinical History: INDICATION: Lumbar radiculopathy. Tingling sensation radiating  down the RIGHT leg.   STUDY: MRI of the lumbar spine without intravenous contrast performed on 03/13/2023 10:37 AM.   COMPARISON: 11/08/2020.   TECHNIQUE: Multiplanar, multisequence MR imaging obtained through the lumbar spine without contrast on 03/13/2023 10:37 AM.   CONTRAST: None.   FINDINGS:  # Osseous structures:  Anterolateral osteophyte at T12-L1. Vertebral body heights are well-maintained. No acute fracture or concerning marrow signal abnormality appreciated.  #  Alignment:Mild levocurvature noted. No significant listhesis appreciated.  #  Conus medullaris/cauda equina: Spinal cord signal is normal and terminates at L1. The cauda equina is unremarkable.   #  Lower thoracic spine: Disc desiccation. No significant spinal canal or foraminal stenosis. This is viewed on the sagittal images only.   #  T12-L1: Disc desiccation with a small disc bulge and a superimposed LEFT paracentral disc protrusion. This effaces the ventral thecal sac. Neuroforamen are patent. No significant interval change.  #  L1-L2: Mild disc desiccation with a minimal disc bulge. No significant stenosis. This is viewed on the sagittal images only and is without significant interval change.  #  L2-L3: Mild disc desiccation with a small disc bulge. No significant stenosis. This is unchanged.  #  L3-L4: Mild disc desiccation with a small disc bulge. No significant stenosis. This is unchanged.  #  L4-L5: Mild disc desiccation with a small disc bulge and facet arthrosis. No significant stenosis. This is unchanged.  #  L5-S1: Mild disc desiccation with a posterior disc herniation that is more pronounced on the LEFT. Mild facet arthrosis. No significant stenosis. This is unchanged.   #  Paraspinal tissues: Unremarkable.   #  Additional comments: None.    IMPRESSION:  1. No acute process or significant interval change.  2.  Mild degenerative changes as described above. No significant nerve root encroachment.   Electronically Signed by: Sheree Daring MD, PHD on 03/18/2023 8:43 AM   She reports that she has never smoked. She has never used smokeless tobacco.  Recent Labs    07/12/23 1212 09/24/23 1029  HGBA1C 6.4 6.4    Objective:  VS:  HT:    WT:   BMI:     BP:   HR: bpm  TEMP: ( )  RESP:  Physical Exam Vitals and nursing note  reviewed.  HENT:     Head: Normocephalic and atraumatic.     Right Ear: External ear normal.     Left Ear: External ear normal.     Nose: Nose normal.     Mouth/Throat:     Mouth: Mucous membranes are moist.  Eyes:     Extraocular Movements: Extraocular movements intact.  Cardiovascular:     Rate and Rhythm: Normal rate.     Pulses: Normal pulses.  Pulmonary:     Effort: Pulmonary effort is normal.  Abdominal:     General: Abdomen is flat. There is no distension.  Musculoskeletal:        General: Tenderness present.     Cervical back: Normal range of motion.     Comments: Patient rises from seated position to standing without difficulty. Good lumbar range of motion. No pain noted with facet loading. 5/5 strength noted with bilateral hip flexion, knee flexion/extension, ankle dorsiflexion/plantarflexion and EHL. No clonus noted bilaterally. No pain upon palpation of greater trochanters. No pain with internal/external rotation of bilateral hips. Sensation intact bilaterally. Palpable myofascial trigger point noted to left lumbar paraspinal region upon palpation. Negative slump test bilaterally. Ambulates without aid, gait steady.  Skin:    General: Skin is warm.     Capillary Refill: Capillary refill takes less than 2 seconds.  Neurological:     General: No focal deficit present.     Mental Status: She is alert and oriented to person, place, and time.  Psychiatric:        Mood and Affect: Mood normal.        Behavior: Behavior normal.     Ortho Exam  Imaging: No results found.  Past Medical/Family/Surgical/Social History: Medications & Allergies reviewed per EMR, new medications updated. Patient Active Problem List   Diagnosis Date Noted   Primary hypertension 11/19/2023   Mixed hyperlipidemia 09/04/2023   Prediabetes 09/20/2022   Chronic cough 08/07/2022   Abnormal CT of the chest 08/07/2022   Chronic rhinitis 03/21/2022   Onychomycosis 01/30/2022   Arthralgia  07/19/2021   Immunosuppressed status (HCC) 07/19/2021   Eustachian tube dysfunction 06/13/2021   Osteoporosis 07/11/2020   Atherosclerosis of native coronary artery of native heart without angina pectoris 06/08/2019   Aortic atherosclerosis (HCC) 06/08/2019   Dyslipidemia 05/02/2019   Postural dizziness with presyncope 05/02/2019   HSV-2 (herpes simplex virus 2) infection 11/04/2018   Hypothyroidism s/p total thyroidectomy 1997 08/15/2018   Autoimmune hepatitis (HCC) 08/16/2012   Asthma 03/31/2012   Chronic ulcerative pancolitis (HCC) 03/31/2012   History of renal calculi 03/31/2012   Migraines 03/31/2012   Mitral valve prolapse    Past Medical History:  Diagnosis Date   Asthma    Autoimmune hepatitis (HCC)    Chronic kidney disease    Fatty liver 2023   CT   Hx of pancreatitis 06/16/2018   Kidney stone    Kidney stone    Migraine    Orthostatic hypotension    Osteopenia 03/31/2012   Recurrent upper respiratory infection (URI)    Ulcerative colitis (HCC) 03/31/2012   Urticaria    Family History  Problem Relation Age of Onset   Heart disease Mother    Dementia Mother    Thyroid  disease Mother    Osteoarthritis Mother    Asthma Mother    Heart attack Father    Heart attack Brother    Heart disease Brother    Colon cancer Maternal Aunt        dx 43s   Ovarian cancer Paternal Aunt 84   Prostate cancer Paternal Uncle        metastatic; dx 51s   Heart disease Paternal Grandmother    Crohn's disease Daughter 57   Breast cancer Cousin        paternal female cousin; dx 30s   Colon cancer Cousin 84       paternal female cousin   Stomach cancer Neg Hx    Rectal cancer Neg Hx    Esophageal cancer Neg Hx    Liver cancer Neg Hx    Pancreatic cancer Neg Hx    Past Surgical History:  Procedure Laterality Date   BIOPSY  01/25/2022   Procedure: BIOPSY;  Surgeon: Saintclair Jasper, MD;  Location: [facility];  Service: Gastroenterology;;   CESAREAN SECTION     x3    CHOLECYSTECTOMY     COLONOSCOPY     COLONOSCOPY N/A 01/25/2022   Procedure: COLONOSCOPY;  Surgeon: Saintclair Jasper, MD;  Location: [facility];  Service: Gastroenterology;  Laterality: N/A;   LITHOTRIPSY     THYROIDECTOMY     1997   Social History   Occupational History   Occupation: Self-employed  Tobacco Use  Smoking status: Never   Smokeless tobacco: Never  Vaping Use   Vaping status: Never Used  Substance and Sexual Activity   Alcohol  use: No   Drug use: No   Sexual activity: Yes

## 2024-02-06 NOTE — Progress Notes (Signed)
 Pain Scale   Average Pain 10 Patient advising she has lower back pain radiating to right side. Patient advised she was walking and the pain began and was very intense.         +Driver, -BT, -Dye Allergies.

## 2024-02-10 ENCOUNTER — Ambulatory Visit
Admission: RE | Admit: 2024-02-10 | Discharge: 2024-02-10 | Disposition: A | Discharge status: Home / Self Care | Source: Ambulatory Visit | Attending: Family Medicine | Admitting: Family Medicine

## 2024-02-10 DIAGNOSIS — E039 Hypothyroidism, unspecified: Secondary | ICD-10-CM

## 2024-02-10 DIAGNOSIS — Z9009 Acquired absence of other part of head and neck: Secondary | ICD-10-CM | POA: Diagnosis not present

## 2024-02-10 DIAGNOSIS — E034 Atrophy of thyroid (acquired): Secondary | ICD-10-CM | POA: Diagnosis not present

## 2024-02-11 DIAGNOSIS — M5136 Other intervertebral disc degeneration, lumbar region with discogenic back pain only: Secondary | ICD-10-CM | POA: Diagnosis not present

## 2024-02-11 DIAGNOSIS — M9903 Segmental and somatic dysfunction of lumbar region: Secondary | ICD-10-CM | POA: Diagnosis not present

## 2024-02-11 DIAGNOSIS — M9905 Segmental and somatic dysfunction of pelvic region: Secondary | ICD-10-CM | POA: Diagnosis not present

## 2024-02-11 DIAGNOSIS — M9904 Segmental and somatic dysfunction of sacral region: Secondary | ICD-10-CM | POA: Diagnosis not present

## 2024-02-14 ENCOUNTER — Other Ambulatory Visit

## 2024-02-14 DIAGNOSIS — K51019 Ulcerative (chronic) pancolitis with unspecified complications: Secondary | ICD-10-CM | POA: Diagnosis not present

## 2024-02-17 ENCOUNTER — Ambulatory Visit: Payer: Self-pay | Admitting: Family Medicine

## 2024-02-17 NOTE — Progress Notes (Signed)
 Good news! Thyroid  ultrasound shows no new nodules or any other significant abnormalities.

## 2024-02-18 ENCOUNTER — Telehealth: Payer: Self-pay | Admitting: *Deleted

## 2024-02-18 DIAGNOSIS — M9903 Segmental and somatic dysfunction of lumbar region: Secondary | ICD-10-CM | POA: Diagnosis not present

## 2024-02-18 DIAGNOSIS — M5136 Other intervertebral disc degeneration, lumbar region with discogenic back pain only: Secondary | ICD-10-CM | POA: Diagnosis not present

## 2024-02-18 DIAGNOSIS — M9905 Segmental and somatic dysfunction of pelvic region: Secondary | ICD-10-CM | POA: Diagnosis not present

## 2024-02-18 DIAGNOSIS — M9904 Segmental and somatic dysfunction of sacral region: Secondary | ICD-10-CM | POA: Diagnosis not present

## 2024-02-18 NOTE — Telephone Encounter (Signed)
 SABRA

## 2024-02-18 NOTE — Telephone Encounter (Signed)
 Copied from CRM [id number]. Topic: Clinical - Lab/Test Results >> Feb 18, 2024 11:55 AM Mesmerise C wrote: Reason for CRM: Patient stated she had a ultrasound done last week and wanting results office going to lunch patient can be reached at [phone] to provide results  See results note Elora RMA

## 2024-02-19 LAB — CALPROTECTIN: Calprotectin: 26 ug/g

## 2024-02-19 NOTE — Telephone Encounter (Signed)
 Copied from CRM #[id number]. Topic: Clinical - Lab/Test Results >> Feb 18, 2024  3:16 PM Henretta I wrote: Reason for CRM: Patient missed call from Mrs. Charley Lafrance in reference to ultrasound results. Please give patient a callback sometime tomorrow if possible  See results note  Taahir Grisby,RMA

## 2024-02-20 ENCOUNTER — Ambulatory Visit: Payer: Self-pay | Admitting: Gastroenterology

## 2024-02-21 DIAGNOSIS — Z1231 Encounter for screening mammogram for malignant neoplasm of breast: Secondary | ICD-10-CM | POA: Diagnosis not present

## 2024-02-21 LAB — HM MAMMOGRAPHY

## 2024-02-26 DIAGNOSIS — M9903 Segmental and somatic dysfunction of lumbar region: Secondary | ICD-10-CM | POA: Diagnosis not present

## 2024-02-26 DIAGNOSIS — M9905 Segmental and somatic dysfunction of pelvic region: Secondary | ICD-10-CM | POA: Diagnosis not present

## 2024-02-26 DIAGNOSIS — M5136 Other intervertebral disc degeneration, lumbar region with discogenic back pain only: Secondary | ICD-10-CM | POA: Diagnosis not present

## 2024-02-26 DIAGNOSIS — M9904 Segmental and somatic dysfunction of sacral region: Secondary | ICD-10-CM | POA: Diagnosis not present

## 2024-03-03 ENCOUNTER — Telehealth: Payer: Self-pay | Admitting: Cardiology

## 2024-03-03 MED ORDER — ROSUVASTATIN CALCIUM 20 MG PO TABS: 20.0000 mg | ORAL_TABLET | Freq: Every day | ORAL | 2 refills | Status: AC

## 2024-03-03 NOTE — Telephone Encounter (Signed)
 Pt c/o medication issue:  1. Name of Medication:   rosuvastatin  (CRESTOR ) 10 MG tablet    2. How are you currently taking this medication (dosage and times per day)?    3. Are you having a reaction (difficulty breathing--STAT)? no  4. What is your medication issue? Patient states that she been doing 20mg  at one time instead of 10mg . She states it agrees with her stomach more. Please advise

## 2024-03-03 NOTE — Telephone Encounter (Signed)
 Pt reports has taken rosuvastatin  20 mg all at one time for the past 3 days.  Hasn't had any burning in stomach.  Would like to have a prescription for rosuvastatin  20 mg sent to pharmacy on file.  Advised pt will send in a new prescriptions.  If stomach issues begin to cut 20 mg pill in half and take BID.  Pt expresses understanding no further concerns voiced.

## 2024-03-04 ENCOUNTER — Ambulatory Visit: Admitting: Podiatry

## 2024-03-04 ENCOUNTER — Encounter: Payer: Self-pay | Admitting: Podiatry

## 2024-03-04 DIAGNOSIS — B351 Tinea unguium: Secondary | ICD-10-CM | POA: Diagnosis not present

## 2024-03-04 NOTE — Progress Notes (Signed)
 Chief Complaint  Patient presents with   Nail Problem    Follow up nail fungus - completed laser treatments, did improve some, not completely, did get rid of dead skin around the heels and toes-used a vitamin E, would like to continue with laser and Jublia     Subjective: 63 y.o. female presenting today for follow-up evaluation of discoloration with thickening to the toenails of the right foot.  Patient states this has been ongoing for about 2 years.  Overall significant improvement.  She is very satisfied with the laser treatments in combination with the Jublia  topical prescribed by her dermatologist  Past Medical History:  Diagnosis Date   Asthma    Autoimmune hepatitis (HCC)    Chronic kidney disease    Fatty liver 2023   CT   Hx of pancreatitis 06/16/2018   Kidney stone    Kidney stone    Migraine    Orthostatic hypotension    Osteopenia 03/31/2012   Recurrent upper respiratory infection (URI)    Ulcerative colitis (HCC) 03/31/2012   Urticaria     Past Surgical History:  Procedure Laterality Date   BIOPSY  01/25/2022   Procedure: BIOPSY;  Surgeon: Saintclair Jasper, MD;  Location: [facility];  Service: Gastroenterology;;   CESAREAN SECTION     x3   CHOLECYSTECTOMY     COLONOSCOPY     COLONOSCOPY N/A 01/25/2022   Procedure: COLONOSCOPY;  Surgeon: Saintclair Jasper, MD;  Location: [facility];  Service: Gastroenterology;  Laterality: N/A;   LITHOTRIPSY     THYROIDECTOMY     1997    Allergies  Allergen Reactions   Almond Oil Hives and Shortness Of Breath   Lialda  [Mesalamine ] Other (See Comments)    Vomiting and rectal bleeding   Other Anaphylaxis    Almonds, tree nuts   Aspirin Hives and Nausea Only    Stomach cramps   Hydromorphone  Hcl Other (See Comments)    Caused Enzyme levels to go up   Penicillins Hives and Swelling    Has patient had a PCN reaction causing immediate rash, facial/tongue/throat swelling, SOB or lightheadedness with hypotension: Yes Has patient had  a PCN reaction causing severe rash involving mucus membranes or skin necrosis: No Has patient had a PCN reaction that required hospitalization: No Has patient had a PCN reaction occurring within the last 10 years: No  Stomach cramps If all of the above answers are NO, then may proceed with Cephalosporin use.    Acetaminophen  Other (See Comments)    Has autoimmune hepatitis, wants to avoid APAP   Apriso  [Mesalamine  Er] Nausea And Vomiting   Corticosteroids Other (See Comments)    Had acute pancreatitis x 2 after steroid use.   Imitrex [Sumatriptan] Swelling    Injection caused swelling   Stadol [Butorphanol] Other (See Comments)    hallucinates   Topamax  [Topiramate ] Other (See Comments)    Kidney stones   Vancomycin Hives   Azathioprine Rash   Pepcid [Famotidine] Nausea And Vomiting    Makes acid worse   Prednisone  Nausea And Vomiting    All steroids causes nausea and acute pancreatitis     RT toenails 05/27/2023  RT foot 05/27/2023  Objective: Physical Exam General: The patient is alert and oriented x3 in no acute distress.  Dermatology: Overall significant improvement in the appearance of the nails  Vascular: Palpable pedal pulses bilaterally. No edema or erythema noted. Capillary refill within normal limits.  Neurological: Grossly intact via light touch  Musculoskeletal Exam: No  pedal deformity noted  Assessment: #1 Onychomycosis of toenails RT foot #2  Chronic tinea pedis right; resolved  Plan of Care:  -Patient evaluated - Patient would like to continue the laser antifungal treatment in combination with the Jublia  since this is working well and she has noted significant improvement -Next appointment here in the office on nurse schedule for laser antifungal, she may continue treatment PRN -Continue topical Jublia  as prescribed by dermatology.  She does not currently need any refills -Return to clinic with me PRN  *Aesthetician  Thresa EMERSON Sar, DPM [facility]  Dr. Thresa EMERSON Sar, DPM    [address] [place], [place] [address]                Office [phone]  Fax [phone]

## 2024-03-04 NOTE — Progress Notes (Signed)
 Chief Complaint  Patient presents with   Nail Problem    Follow up nail fungus - completed laser treatments, did improve some, not completely, did get rid of dead skin around the heels and toes-used a vitamin E, would like to continue with laser and Jublia     Subjective: 63 y.o. female presenting today for follow-up evaluation of discoloration with thickening to the toenails of the right foot.  Patient states this has been ongoing for about 2 years.  Overall significant improvement.  She is very satisfied with the laser treatments in combination with the Jublia  topical prescribed by her dermatologist  Past Medical History:  Diagnosis Date   Asthma    Autoimmune hepatitis (HCC)    Chronic kidney disease    Fatty liver 2023   CT   Hx of pancreatitis 06/16/2018   Kidney stone    Kidney stone    Migraine    Orthostatic hypotension    Osteopenia 03/31/2012   Recurrent upper respiratory infection (URI)    Ulcerative colitis (HCC) 03/31/2012   Urticaria     Past Surgical History:  Procedure Laterality Date   BIOPSY  01/25/2022   Procedure: BIOPSY;  Surgeon: Saintclair Jasper, MD;  Location: [facility];  Service: Gastroenterology;;   CESAREAN SECTION     x3   CHOLECYSTECTOMY     COLONOSCOPY     COLONOSCOPY N/A 01/25/2022   Procedure: COLONOSCOPY;  Surgeon: Saintclair Jasper, MD;  Location: [facility];  Service: Gastroenterology;  Laterality: N/A;   LITHOTRIPSY     THYROIDECTOMY     1997    Allergies  Allergen Reactions   Almond Oil Hives and Shortness Of Breath   Lialda  [Mesalamine ] Other (See Comments)    Vomiting and rectal bleeding   Other Anaphylaxis    Almonds, tree nuts   Aspirin Hives and Nausea Only    Stomach cramps   Hydromorphone  Hcl Other (See Comments)    Caused Enzyme levels to go up   Penicillins Hives and Swelling    Has patient had a PCN reaction causing immediate rash, facial/tongue/throat swelling, SOB or lightheadedness with hypotension: Yes Has patient had  a PCN reaction causing severe rash involving mucus membranes or skin necrosis: No Has patient had a PCN reaction that required hospitalization: No Has patient had a PCN reaction occurring within the last 10 years: No  Stomach cramps If all of the above answers are NO, then may proceed with Cephalosporin use.    Acetaminophen  Other (See Comments)    Has autoimmune hepatitis, wants to avoid APAP   Apriso  [Mesalamine  Er] Nausea And Vomiting   Corticosteroids Other (See Comments)    Had acute pancreatitis x 2 after steroid use.   Imitrex [Sumatriptan] Swelling    Injection caused swelling   Stadol [Butorphanol] Other (See Comments)    hallucinates   Topamax  [Topiramate ] Other (See Comments)    Kidney stones   Vancomycin Hives   Azathioprine Rash   Pepcid [Famotidine] Nausea And Vomiting    Makes acid worse   Prednisone  Nausea And Vomiting    All steroids causes nausea and acute pancreatitis     RT toenails 05/27/2023  RT foot 05/27/2023  Objective: Physical Exam General: The patient is alert and oriented x3 in no acute distress.  Dermatology: Overall significant improvement in the appearance of the nails  Vascular: Palpable pedal pulses bilaterally. No edema or erythema noted. Capillary refill within normal limits.  Neurological: Grossly intact via light touch  Musculoskeletal Exam: No  pedal deformity noted  Assessment: #1 Onychomycosis of toenails RT foot #2  Chronic tinea pedis right; resolved  Plan of Care:  -Patient evaluated - Patient would like to continue the laser antifungal treatment in combination with the Jublia  since this is working well and she has noted significant improvement -Next appointment here in the office on nurse schedule for laser antifungal, she may continue treatment PRN -Continue topical Jublia  as prescribed by dermatology.  She does not currently need any refills -Return to clinic with me PRN  *Aesthetician  Thresa EMERSON Sar, DPM [facility]  Dr. Thresa EMERSON Sar, DPM    [address] [place], [place] [address]                Office [phone]  Fax [phone]

## 2024-03-06 ENCOUNTER — Other Ambulatory Visit: Payer: Self-pay | Admitting: *Deleted

## 2024-03-06 MED ORDER — RINVOQ 30 MG PO TB24: 1.0000 | ORAL_TABLET | Freq: Every day | ORAL | 1 refills | Status: DC

## 2024-03-10 DIAGNOSIS — Z1231 Encounter for screening mammogram for malignant neoplasm of breast: Secondary | ICD-10-CM | POA: Diagnosis not present

## 2024-03-10 DIAGNOSIS — Z01419 Encounter for gynecological examination (general) (routine) without abnormal findings: Secondary | ICD-10-CM | POA: Diagnosis not present

## 2024-03-10 DIAGNOSIS — Z1339 Encounter for screening examination for other mental health and behavioral disorders: Secondary | ICD-10-CM | POA: Diagnosis not present

## 2024-03-10 DIAGNOSIS — M810 Age-related osteoporosis without current pathological fracture: Secondary | ICD-10-CM | POA: Diagnosis not present

## 2024-03-10 DIAGNOSIS — N950 Postmenopausal bleeding: Secondary | ICD-10-CM | POA: Diagnosis not present

## 2024-03-10 DIAGNOSIS — E559 Vitamin D deficiency, unspecified: Secondary | ICD-10-CM | POA: Diagnosis not present

## 2024-03-11 ENCOUNTER — Telehealth: Payer: Self-pay | Admitting: *Deleted

## 2024-03-11 ENCOUNTER — Other Ambulatory Visit: Payer: Self-pay | Admitting: *Deleted

## 2024-03-11 DIAGNOSIS — E559 Vitamin D deficiency, unspecified: Secondary | ICD-10-CM

## 2024-03-11 NOTE — Telephone Encounter (Signed)
 LVM RSV vaccine can be done at your pharmacy of choice  Give us  a call to schedule a lab appt

## 2024-03-11 NOTE — Telephone Encounter (Signed)
 Copied from CRM #[id number]. Topic: Clinical - Medication Question >> Mar 10, 2024  3:42 PM Danielle Bond ORN wrote: Reason for CRM: For Dr. Shaune nurse. Wants to know if there is any way that she can get the RSV shot. And also, would like to have her viatmin D checked. Has been feeling tireness and cramping in the back of her legs.  Ok to placed lab order? Gracee Ratterree,RMA

## 2024-03-11 NOTE — Telephone Encounter (Signed)
 She will need to get the RSV vaccine at the pharmacy.  We do not have those in stock here.  It is okay to order vitamin D  labs.

## 2024-03-13 ENCOUNTER — Other Ambulatory Visit

## 2024-03-17 ENCOUNTER — Other Ambulatory Visit (INDEPENDENT_AMBULATORY_CARE_PROVIDER_SITE_OTHER)

## 2024-03-17 DIAGNOSIS — E559 Vitamin D deficiency, unspecified: Secondary | ICD-10-CM

## 2024-03-17 LAB — VITAMIN D 25 HYDROXY (VIT D DEFICIENCY, FRACTURES): VITD: 44.87 ng/mL (ref 30.00–100.00)

## 2024-03-18 ENCOUNTER — Encounter: Payer: Self-pay | Admitting: Gastroenterology

## 2024-03-18 DIAGNOSIS — H6991 Unspecified Eustachian tube disorder, right ear: Secondary | ICD-10-CM | POA: Diagnosis not present

## 2024-03-18 DIAGNOSIS — H9071 Mixed conductive and sensorineural hearing loss, unilateral, right ear, with unrestricted hearing on the contralateral side: Secondary | ICD-10-CM | POA: Diagnosis not present

## 2024-03-19 ENCOUNTER — Ambulatory Visit: Payer: Self-pay | Admitting: Family Medicine

## 2024-03-19 NOTE — Progress Notes (Signed)
 Vitamin D  is at goal.  We can recheck in 6 to 12 months.

## 2024-03-24 ENCOUNTER — Ambulatory Visit: Admitting: Internal Medicine

## 2024-03-24 ENCOUNTER — Encounter: Payer: Self-pay | Admitting: Internal Medicine

## 2024-03-24 VITALS — BP 124/70 | HR 87 | Ht 60.0 in | Wt 163.0 lb

## 2024-03-24 DIAGNOSIS — J45991 Cough variant asthma: Secondary | ICD-10-CM | POA: Diagnosis not present

## 2024-03-24 DIAGNOSIS — J4540 Moderate persistent asthma, uncomplicated: Secondary | ICD-10-CM

## 2024-03-24 LAB — NITRIC OXIDE: Nitric Oxide: 12

## 2024-03-24 NOTE — Progress Notes (Signed)
 PCP Danielle Agent, MD  Neurologist-Dr. Onita Bond diseases Dr. Norleen Bond Orthopedic for right shoulder-Dr. Harden SABA 06/18/2018  Chief Complaint  Patient presents with   Pulmonary Consult    asthma, [name], NP referred her, she went to the ER and she had bronchitis with fever, she has constant fever and she has seen Bond DIsease for this, she has a dry hacking cough    63 year old female with a chronic complex set of medical problems that includes hyperlipidemia, Graves' disease, status post thyroidectomy, ulcerative colitis, kidney stone and history of acute pancreatitis.  She also has long history of migraine headaches.  She presents with her husband for respiratory issues in the context of fever of unknown origin and bloody stools  She tells me that she was diagnosed with ulcerative colitis in [place]  in 2007 when the used to live [place].  In 2013 she migrated to [place], [place]  and had a bad flareup of ulcerative colitis for which she was admitted.  Since then she has been under the care of Dr. Kristie gastroenterologist and she has been on Lialda .  Overall she is doing really wel0 .then some few years ago she was started on Brio for diagnosis of asthma.  Details of this is unclear.  Overall doing well since then till September 2019 when she felt the Lialda  stopped working on a subjective basis.  This is because since then every time she takes the Lialda  she has abdominal pain.  She also has fever at night on a daily basis.  She also has bloody stools at least 7 times a day.  She has abdominal pain when she takes the Lialda .  Per her report Dr. Kristie has done a colonoscopy with biopsy that showed inflammation.  Therefore biologic is being considered.  However she is also increasing fatigue and shortness of breath associated with it.  Is mostly present on exertion relieved by rest.  Is also wheezing.  Overall she has had a significant deterioration.  Then  towards the end of October 2019 she felt she had an asthma flareup and reported to the urgent care center where she was treated acutely.  But she represented within a week or 10 days to the ER and was given a diagnosis of acute bronchitis.  CT angiogram ruled out pulmonary embolism.  All these records have been reviewed by me and the images and results visualized.  She subsequently has seen Dr. Norleen Bond Bond diseases this week.  I reviewed his findings.  He is worried that the Lialda  is causing eosinophilia.  He does not suspect Bond disease as a reason for the fever quantified on gold and HIV are negative.  Further blood results are pending.  Patient says she has had an extensive work-up.  She is also seen [facility] rheumatology department Dr. KANDICE Bond who apparently has done an extensive autoimmune panel and this is all negative.  Unclear is if vasculitis work-up has been done that includes ANCA and GBM.  Patient is really frustrated by the whole constellation of symptoms.  At this point in time a biologic Bond is being considered for ulcerative colitis but apparently the fever is being considered is unusual in the setting of ulcerative colitis and therefore she is going through the work-up  Exam nitric oxide  is 21 ppb and this is on Breo today and is normal.  Recent labs June 12, 2018 done at [facility] rheumatology: Sjogren antibody centromere antibody, Idell  1 antibody, chromatin antibody, SSA, SSB, anti-scleroderma antibody, Smith antibody, double-stranded DNA antibody and RNP antibody and ESR all normal.  TSH is normal.  Hemoglobin is 12 g%.  Your sniffles continue to be high at 13.6% of her total white count of 7400.  Creatinine is normal.  Urine analysis appears normal.  Results for Danielle Bond, Danielle Bond (MRN [id number]) as of 06/18/2018 09:18  Ref. Range 11/27/2013 12:40 03/10/2014 18:52 03/16/2014 16:42 05/23/2018 05:31 06/16/2018 15:10  Eosinophils  Absolute Latest Ref Range: 15 - 500 cells/uL 0.2 0.4 0.2 0.6 (H) 385   Results for Danielle Bond, Danielle Bond (MRN [id number]) as of 06/18/2018 09:18  Ref. Range 10/05/2013 14:36 03/16/2014 16:42  Anti Nuclear Antibody(ANA) Latest Ref Range: NEGATIVE  POS (A)   ANA Pattern 1 Unknown HOMOGENOUS (A)   ANA Titer 1 Latest Ref Range: <1:40   1:1280 (H)   ds DNA Ab Latest Units: IU/mL  1  C3 Complement Latest Ref Range: 90 - 180 mg/dL  883  C4 Complement Latest Ref Range: 10 - 40 mg/dL  18   Most recent CT scan of the chest angiogram May 23, 2018: Personally visualized.  Lung fields look fairly benign.  OV 07/29/2018  Subjective:  Patient ID: LESCHES Bond, female , DOB: 1961-06-26 , age 75 y.o. , MRN: [id number] , ADDRESS: [address] [place] [id number]   07/29/2018 -   Chief Complaint  Patient presents with   Follow-up    Pt states she is doing better since last visit. States she still has an occ dry cough but nothing like last visit. Pt states she does have some SOB/chest tightness today due to the weather.     HPI Danielle Bond 63 y.o. -presents with her husband for chronic cough and fever of unknown origin in the setting of ulcerative colitis and Lialda  treatment  After her last visit we reviewed autoimmune panel from primary rheumatologist and this was negative.  We performed vasculitis panel and this was negative.  In the interim she is seen Bond disease specialist Dr. Elaine and apparently no infection present for her fever of unknown origin.  Discussions were held with Dr. Kristie.  She also discussed with a gastroenterologist at [facility] and the thought process was that maybe the fevers are indeed coming from ulcerative colitis although this is very rare.  This is because autoimmune, vasculitis and Bond work-up was negative.  On July 01, 2018 patient's Lialda  was stopped and she has been prescribed budesonide  oral treatment.  Subsequently after this within a  few weeks her cough is significantly improved.  She not having any more night sweats other than one at night.  This is 80% better.  And she is also feeling a lot better.  Apparently starting Entyvio biologic for ulcerative colitis is in discussion but she is somewhat got trepidation about this given the fact it is an infusion.  She will meet with Dr. Kristie about this.  Overall at this point she is feeling well no respiratory complaints other than the mild cough.  She is asking for referral to primary care physician.  Her current primary care physician is apparently out of the office for an extended period of time not otherwise specified.   OV 04/09/2019  Subjective:  Patient ID: LESCHES Bond, female , DOB: 11/15/1960 , age 57 y.o. , MRN: [id number] , ADDRESS: [address] [place] [id number]   04/09/2019 -  chronic cough and fever of unknown origin in the setting of ulcerative  colitis and Lialda  treatment    HPI Vaniya Augspurger 63 y.o. - cough was resolved. Now back 1.5 weeks ago. Dry cough. Hacky cough. Sometimes she does bring sputum. Annoying. Has on and off migraine headaches. No fever, No hemoptysis . No anosmia. No ageusia. No covid exposures but daughter had strep throat 2 weeks ago (confirmed strep with confirmed negative covid). No sore throat. Throat does feel itchy.  Does NOT want to do antibiotics. Doing low risk covid activities only  Re UC - doing Humira  by Dr Saintclair since October 01, 2018 - q 2 weeks. .  Fevers went away in dec 2019. UC doing ok  OV 04/16/2019  Subjective:  Patient ID: Keene Pinna, female , DOB: [birth year] , age 31 y.o. , MRN: [id number] , ADDRESS: [address] [place] [id number]   04/16/2019 -   Chief Complaint  Patient presents with   Follow-up    Pt c/o prod cough with yellow mucus x 2 weeks. Pt also c/o left sided discomfort on lateral chest with inspiration. Pt states due to the discomfort she feels she cant take a full breath in. Pt denies  f/c/s and swelling.    Acute visit  HPI Maryanne Huneycutt 63 y.o. -is here for acute visit.  Originally seen in  Nov2019 for fever of unknown origin.  At that time ulcerative colitis was suspected along with Lialda  because she had some eosinophilia.  She has been on Breo.  She tells me that her fever and cough broke and it was unrelated to stopping Lialda .  She then switched gastroenterologist and has been on care with Dr. Saintclair.  She is on Humira  but she tells me the fever and cough stopped even before she went on Humira .  She had been doing well.  Approximately 3 weeks ago or so daughter developed strep throat [apparently COVID negative].  Then approximately 2 weeks ago she developed a cough.  We did do a telephone visit.  At that time she opted for expectant follow-up.  She is here acutely because the cough is persistent.  She also has some yellow sputum.  A few days ago she did feel like a muscle pull in the left infra-axillary area with the cough.  This is resolved.  Early this morning she had very mild symptoms of the same.  Therefore she is here but there is no wheezing or shortness of breath.  There is no pleuritic chest pain.  There is no hemoptysis no pedal edema.  She continues with the Brio.  We did do a COVID antigen testing/PCR testing and this was negative on 10 April 2019.  In May 2020 her COVID serology for IgG was negative.  She is interested in a chest x-ray and empiric treatment with antibiotics.  We are unable to do an exhaled nitric oxide  test because of the national COVID emergency and local site restrictions.   COVID negative 04/10/2019 - negtaive Results for LESLE, FARON (MRN [id number]) as of 04/16/2019 15:48  Ref. Range 12/17/2018 09:18  SARS CoV2 AB IGG Unknown NEGATIVE    Results for RILLEY, POULTER (MRN [id number]) as of 04/16/2019 15:48  Ref. Range 06/18/2018 10:17 06/26/2018 01:15 06/26/2018 14:29  Eosinophils Absolute Latest Ref Range: 15 - 500 cells/uL 0.9 (H) 0.4 535  (H)  Results for ZONDRA, LAWLOR (MRN [id number]) as of 04/16/2019 15:48  Ref. Range 06/18/2018 10:17  IgE (Immunoglobulin E), Serum Latest Ref Range: <OR=114 kU/L 74   Results for PRITI, CONSOLI (MRN [id number]) as of  04/16/2019 15:48  Ref. Range 08/15/2012 19:14 10/05/2013 14:36 03/16/2014 16:42 06/26/2018 14:29 07/02/2018 10:42  Anti Nuclear Antibody (ANA) Latest Ref Range: Negative   POS (A)   Negative  ANA Pattern 1 Unknown  HOMOGENOUS (A)     ANA Titer 1 Latest Ref Range: <1:40    1:1280 (H)     ds DNA Ab Latest Units: IU/mL   1    GBM Ab Latest Units: AI    <1.0 <1.0  Mitochondrial M2 Ab, IgG Latest Ref Range: <0.91  1.14 (H)      Myeloperoxidase Abs Latest Units: AI     <1.0  Serine Protease 3 Latest Units: AI     <1.0  S cerevisiae IgG ab Latest Ref Range: <=20.0 Units 12.7      S cerevisiae IgA ab Latest Ref Range: <=20.0 Units 18.3       ROS  October 07, 2019:.  Visit with nurse practitioner Tammy Parrott  63 year old female never smoker followed for asthma, eosinophilia (possibly secondary to Lialda  )  Medical history significant for Graves' disease, ulcerative colitis (now on Humira ) and pancreatitis Today's video visit is for 4 week follow up for asthma and chronic cough .  Last visit patient was having increased cough.  She was changed from [facility] to Symbicort .  She says she is feeling better.  Feels that she can take in a clear breath and also cough has decreased.  She is practicing vocal cord relaxation techniques.  Feels that this is also helping.  She is using saline nasal rinses and this also seems to decrease her nasal congestion and postnasal drip.  She says overall she is feeling better.    OV 12/17/2019  Subjective:  Patient ID: Keene Pinna, female , DOB: July 18, 1961 , age 75 y.o. , MRN: [id number] , ADDRESS: [address] [place] [id number]   12/17/2019 -   Chief Complaint  Patient presents with   Follow-up    Cough     HPI Quinteria Chisum 63 y.o.  -presents for evaluation of chronic cough and follow-up.  After last saw her nurse practitioner saw her and it is documented above.  This was in March 2021.  Her symptoms had improved with cough neuropathy advised.  Patient was reluctant to undergo gabapentin .  She now returns for follow-up.  She tells me the cough is deteriorated with the change in weather from spring to summer and the humidity.  She feels she has asthma.  This is despite taking Symbicort .  She demonstrated a cough for me and it has a laryngeal quality.  She admits that throat is very dry and she feels it to be itchy.  There is also a tickle in the throat.  Cough does not bother her at night when she sleeping.  Otherwise no new problems.  Overall she feels her health is improved and her new immunosuppression is working well.   OV 05/24/2020  Subjective:  Patient ID: Keene Pinna, female , DOB: Apr 18, 1961 , age 39 y.o. , MRN: [id number] , ADDRESS: [address] [place] [id number] PCP Kennyth Worth HERO, MD Patient Care Team: Kennyth Worth HERO, MD as PCP - General (Family Medicine) Curt Lighter, MD as Consulting Physician (Rheumatology) Mannam, Praveen, MD as Consulting Physician (Pulmonary Disease) Geronimo Amel, MD as Consulting Physician (Pulmonary Disease) Tommas Pears, MD as Referring Physician (Endocrinology) Benay Kay, PA-C as Physician Assistant (Physician Assistant) Darcel Pool, MD as Consulting Physician (Obstetrics and Gynecology)  This Provider for this visit:  Treatment Team:  Attending Provider: Geronimo Amel, MD    05/24/2020 -   Chief Complaint  Patient presents with   Follow-up    Chronic cough, Asthma     HPI Tonyetta Berko 63 y.o. -presents for follow-up of her residual chronic cough.  Last seen in May 2021.  At that time was concerned about residual  coughing due to cough neuropathy.  Patient is reluctant to add more medications.  We subjected her to allergy work-up.  She is  here to review the results.  Her IgE is elevated.  She has a dog in the house and dog dander is significantly positive.  She is also allergic to few other things documented below.  She states that her cough happens during the spring and the fall consistent with the allergy season.  She feels that the allergies are driving this.  She is again somewhat reluctant to have more medications.  We discussed potential biologic therapies for asthma.  She is open to listening to this.  But she is also interested in like other options.  She is willing to see an allergist.  Reviewed her vaccination status.  She is up-to-date with her vaccines but she could benefit from a pneumonia vaccine especially because she is immunosuppressed and she tells me it is greater than 10 years since she took her pneumonia vaccine not otherwise specified.   ear-old female never smoker followed for moderate persistent asthma with allergic phenotype and chronic cough History of eosinophilia felt secondary to Lialda   History of Graves' disease, ulcerative colitis and pancreatitis      06/27/2021 Acute OV : Asthma     female never smoker followed for moderate persistent asthma with an allergic phenotype, and chronic cough.  History of eosinophilia felt possibly secondary to Lialda .  Graves disease, ulcerative colitis on Humira  and pancreatitis  Patient presents for an acute office visit.  Complains of cough x2 weeks. Initially had ear infection 2 weeks ago, treated by ENT with Zpack. Was exposed to possible flu cases with her granddaughter over the holiday. She developed body aches, chills, sweats , cough, congestion , joint pains. That has improved but continues to have intermittent cough and wheezing /tightness. Increased use of albuterol  . Patient remains on Symbicort  twice daily.  Flu shot utd, Covid vaccine x 4 .  No chest pain, orthopnea or edema. Appetite is good.  Can not take high dose steroids or shots, can take low dose  without issues.   OV 09/22/2021  Subjective:  Patient ID: Keene JULIANNA Pinna, female , DOB: 05-Aug-1960 , age 52 y.o. , MRN: [id number] , ADDRESS: [address] [place] [id number] PCP Kennyth Worth HERO, MD Patient Care Team: Kennyth Worth HERO, MD as PCP - General (Family Medicine) Curt Lighter, MD as Consulting Physician (Rheumatology) Mannam, Praveen, MD as Consulting Physician (Pulmonary Disease) Geronimo Amel, MD as Consulting Physician (Pulmonary Disease) Tommas Pears, MD as Referring Physician (Endocrinology) Benay Kay, PA-C as Physician Assistant (Physician Assistant) Darcel Pool, MD as Consulting Physician (Obstetrics and Gynecology) Saintclair Jasper, MD as Consulting Physician (Gastroenterology)  This Provider for this visit: Treatment Team:  Attending Provider: Geronimo Amel, MD    09/22/2021 -   Chief Complaint  Patient presents with   Follow-up    Pt states that her cough is better compared to last visit. Denies any complaints of SOB, chest discomfort, or wheezing.   Follow-up chronic cough with component of cough neuropathy and also allergic asthma on Symbicort  Immunosuppressed because of  Humira  and due to ulcerative colitis   HPI RAYME BUI 63 y.o. -returns for follow-up.  I personally saw her in November 2021.  At this point in time in follow-up her cough is completely resolved.  She is not bothered by it.  She continues her Symbicort .  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  That also has resolved.  She took a short course prednisone  with the nurse practitioner.  Chest x-ray 06/27/2021 is clear.    However she and her husband report that the last few months has been very challenging for her.  She says that around Thanksgiving 2022 she had urine infection and it cleared but then she got it again and then got antibiotics and it cshe started developing pain around her ankle, knee, wrist,  fingers and bilateral shoulders.  Associated with diminished range of motion.  She saw Dr. Curt bodily rheumatologist and now no longer her rheumatologist].  ANA test and apparently Humira  antibody test were negative.  He recommended medications but she did not align with this.  She then followed up with GI Dr. Herschell around 08/21/2021.  She ended up in the emergency department with dehydration and requiring fluids.  She had tachycardia heart rate 130.  She was given Ceftin  and nonsteroidal anti-inflammatory drug but she opted not to take it.  She just took eardrops and then started feeling better.  Apparently the D-dimer was high.  2 days later her primary care physician did CT angiogram and duplex of the lower extremities and this was negative for DVT.  She also followed up with cardiology and was given Lopressor  for tachycardia which is working well.  She also saw [facility] and had nerve conduction study that was negative.  She subsequently switched to Dr. Harden and saw his PA.  She had an amazing experience there.  AShe has shoulder frozen.  She got cortisone shot and after that her insomnia and the challenges coming with all this knee pain have improved significantly and she is feeling much better.  Chest x-ray December 2022: Clear EKG  CT Chest data 08/23/2021 and personally visualized    EXAM: CT ANGIOGRAPHY CHEST WITH CONTRAST   TECHNIQUE: Multidetector CT imaging of the chest was performed using the standard protocol during bolus administration of intravenous contrast. Multiplanar CT image reconstructions and MIPs were obtained to evaluate the vascular anatomy.   RADIATION DOSE REDUCTION: This exam was performed according to the departmental dose-optimization program which includes automated exposure control, adjustment of the mA and/or kV according to patient size and/or use of iterative reconstruction technique.   CONTRAST:  75mL OMNIPAQUE  IOHEXOL  350 MG/ML SOLN   COMPARISON:   06/15/2019   FINDINGS: Cardiovascular: Heart size upper limits normal. No pericardial effusion. The RV is nondilated. Satisfactory opacification of pulmonary arteries noted, and there is no evidence of pulmonary emboli. There is good contrast opacification of the thoracic aorta. No dissection or stenosis. Calcified atheromatous plaque in the arch. 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 Mild atheromatous plaque in the proximal visualized abdominal aorta which is nondilated.   Mediastinum/Nodes: No mass or adenopathy. Surgical clips from thyroidectomy.   Lungs/Pleura: No pleural effusion. Patchy poorly marginated ground-glass opacities predominantly in dependent aspect of both lung bases.   Upper Abdomen: No acute findings.   Musculoskeletal: No chest wall abnormality. No acute or significant osseous findings.   Review of the MIP images confirms the  above findings.   IMPRESSION: 1.  No acute PE or thoracic aortic dissection.     Electronically Signed   By: JONETTA Faes M.D.   On: 08/23/2021 12:59       Results for CHELSY, PARRALES (MRN [id number]) as of 05/24/2020 10:17  Ref. Range 12/17/2019 10:45  Sheep Sorrel IgE Latest Units: kU/L <0.10  Pecan/Hickory Tree IgE Latest Units: kU/L <0.10  IgE (Immunoglobulin E), Serum Latest Ref Range: <OR=114 kU/L 158 (H)  Allergen, D pternoyssinus,d7 Latest Units: kU/L <0.10  Cat Dander Latest Units: kU/L 2.89 (H)  Dog Dander Latest Units: kU/L 22.00 (H)  [place] Grass Latest Units: kU/L <0.10  Johnson Grass Latest Units: kU/L <0.10  Timothy Grass Latest Units: kU/L 0.16 (H)  Cockroach Latest Units: kU/L <0.10  Aspergillus fumigatus, m3 Latest Units: kU/L <0.10  Allergen, Comm Silver Birch, t9 Latest Units: kU/L 1.71 (H)  Allergen, Cottonwood, t14 Latest Units: kU/L <0.10  Elm IgE Latest Units: kU/L <0.10  Allergen, Mulberry, t76 Latest Units: kU/L <0.10  Allergen, Oak,t7 Latest Units: kU/L  0.88 (H)  COMMON RAGWEED (SHORT) (W1) IGE Latest Units: kU/L 0.87 (H)  Allergen, Mouse Urine Protein, e78 Latest Units: kU/L <0.10  D. farinae Latest Units: kU/L <0.10  Allergen, Cedar tree, t12 Latest Units: kU/L <0.10  Box Elder IgE Latest Units: kU/L <0.10  Rough Pigweed  IgE Latest Units: kU/L <0.10   Results for SHANTIL, VALLEJO (MRN [id number]) as of 05/24/2020 10:17  Ref. Range 12/17/2019 10:45  IgE (Immunoglobulin E), Serum Latest Ref Range: <OR=114 kU/L 158 (H)   ROS - per HPI  08/07/2022 Follow up : Asthma and chronic Cough  Patient returns for 48-month follow-up.  Patient has underlying asthma with an allergic phenotype and chronic cough.  Overall says she is doing well with no flare of cough or wheezing.  No increased albuterol  use.  She does remain on Symbicort  twice daily.  She remains very active.  Goes to the gym and does yoga, pilates and zumba.  Has chronic allergies.  Remains on Claritin and Flonase  as needed.  Continues to follow with GI for ulcerative colitis.  Remains on Rinvoq . Says overall is doing okay . Did have C Diff last year, recovered but took a long time.   Traveled to [place]  last year, had a really great trip . Going to [place] this year.   Flu shot , PVX and covid booster is up to date.     TEST/EVENTS :  PFTs February 02, 2020 shows mild restriction with FEV1 at 85%, ratio 89, FVC 74%, no significant bronchodilator response, DLCO at 109%.   01/2022 Eosinophils -600   CT chest August 23, 2021 negative for PE, bibasilar patchy groundglass  OV 04/29/2023  Subjective:  Patient ID: LESCHES JULIANNA [place], female , DOB: [birth year] , age 7 y.o. , MRN: [id number] , ADDRESS: [address] [place] [phone] PCP Kennyth Worth HERO, MD Patient Care Team: Kennyth Worth HERO, MD as PCP - General (Family Medicine) Curt Lighter, MD as Consulting Physician (Rheumatology) Mannam, Praveen, MD as Consulting Physician (Pulmonary Disease) Geronimo Amel, MD  as Consulting Physician (Pulmonary Disease) Tommas Pears, MD as Referring Physician (Endocrinology) Benay Kay, PA-C as Physician Assistant (Physician Assistant) Darcel Pool, MD as Consulting Physician (Obstetrics and Gynecology) Saintclair Jasper, MD as Consulting Physician (Gastroenterology) Porter Andrez SAUNDERS, PA-C as Physician Assistant (Dermatology)  This Provider for this visit: Treatment Team:  Attending Provider: Geronimo Amel, MD    04/29/2023 -   Chief Complaint  Patient presents with   Follow-up    Doing well and denies any cough at this time.      HPI OYINDAMOLA KEY 63 y.o. -   #Follow-up chronic cough with irritable larynx syndrome cough variant asthma with allergy to dog and cat dander and elevated IgE and blood eosinophils  -Personally not seen her in 18 months almost.  She did see nurse practitioner earlier this year and chest x-ray was clean.  She tells me summer 2023 the dog died and after that the asthma started getting much better.  Her shortness of breath resolved.  Symptoms are very minimal.  She said that after she saw Madelin Passy and I confirmed this review of records in general 2024 she started feeling better so she has been reduced on Symbicort  to 1 puff twice daily which she continues.  She barely uses albuterol  no nocturnal awakenings no chest tightness no wheezing orthopnea no paroxysmal nocturnal dyspnea.  Effort tolerance is fantastic.  #Positive ANA and 2021: 1: 1280.  No evidence of systemic connective tissue disease.  No Raynaud's.  She has never seen a rheumatologist.  She prefers expectant follow-up   #Ulcerative colitis: She tells me a year ago she was started on Rinvoq .  She is also seeing Dr. Leigh right now.  Symptoms are in excellent control recent colonoscopy was nearly normal.  She is pleased about this.  #Social.  Her daughter is graduating from [employer].  She likes animation.  Is very happy about  that.  #Vaccine counseling: She will have the flu shot today.  Advised about RSV vaccine.     OV 03/24/2024  Subjective:  Patient ID: Keene JULIANNA Pinna, female , DOB: [birth year] , age 61 y.o. , MRN: [id number] , ADDRESS: [address] [place] [id number] PCP Kennyth Worth HERO, MD Patient Care Team: Kennyth Worth HERO, MD as PCP - General (Family Medicine) Elmira Newman PARAS, MD as PCP - Cardiology (Cardiology) Curt Lighter, MD as Consulting Physician (Rheumatology) Mannam, Praveen, MD as Consulting Physician (Pulmonary Disease) Geronimo Amel, MD as Consulting Physician (Pulmonary Disease) Tommas Pears, MD as Referring Physician (Endocrinology) Benay Kay, PA-C as Physician Assistant (Physician Assistant) Darcel Pool, MD as Consulting Physician (Obstetrics and Gynecology) Saintclair Jasper, MD as Consulting Physician (Gastroenterology) Porter Andrez SAUNDERS, PA-C (Inactive) as Physician Assistant (Dermatology)  This Provider for this visit: Treatment Team:  Attending Provider: Geronimo Amel, MD    03/24/2024 -   Chief Complaint  Patient presents with   Medical Management of Chronic Issues   Cough    Overall doing well. She has had some chest tightness this morning. She has started to cough again over the past wk- non prod.     #Follow-up chronic cough with irritable larynx syndrome cough variant asthma with allergy to dog and cat dander and elevated IgE and blood eosinophils   - on 03/24/2024 : overall well. Interim Health status:  No new medical problems. No new surgeries. No ER visits. No Urgent care visits. No changes to medications. However, x 2 wesk there is return in cough. Mostly in day. None at night.Dry. related to weather change. Improved by tea. FENO 03/24/2024 is 12 ppbd and normal     #Positive ANA and 2021: 1: 1280.  No evidence of systemic connective tissue disease.  No Raynaud's.  She has never seen a rheumatologist.  She prefers expectant  follow-up   #Ulcerative colitis:  Rinvoq .  She is also seeing Dr. Leigh right now.  Cotninues  to be under great control    Dr Felice Reflux Symptom Index (> 13-15 suggestive of LPR cough) 0 -> 5  =  none ->severe problem 03/24/2024   Hoarseness of problem with voice 1  Clearing  Of Throat 0  Excess throat mucus or feeling of post nasal drip 1  Difficulty swallowing food, liquid or tablets 0  Cough after eating or lying down 0  Breathing difficulties or choking episodes 0  Troublesome or annoying cough 2  Sensation of something sticking in throat or lump in throat 1  Heartburn, chest pain, indigestion, or stomach acid coming up 2  TOTAL 7        Asthma Control Test ACT Total Score  08/07/2022  8:36 AM 23  03/21/2022 11:24 AM 21  06/27/2021 10:40 AM 9     Lab Results  Component Value Date   NITRICOXIDE 12 03/24/2024     PFT     Latest Ref Rng & Units 12/17/2019   10:45 AM  PFT Results  FVC-Pre L 2.15   FVC-Predicted Pre % 75   FVC-Post L 2.12   FVC-Predicted Post % 74   Pre FEV1/FVC % % 88   Post FEV1/FCV % % 89   FEV1-Pre L 1.89   FEV1-Predicted Pre % 85   FEV1-Post L 1.90   DLCO uncorrected ml/min/mmHg 19.37   DLCO UNC% % 109   DLCO corrected ml/min/mmHg 19.37   DLCO COR %Predicted % 109   DLVA Predicted % 136   TLC L 3.39   TLC % Predicted % 76   RV % Predicted % 66        LAB RESULTS last 96 hours No results found.       has a past medical history of Asthma, Autoimmune hepatitis (HCC), Chronic kidney disease, Fatty liver (2023), pancreatitis (06/16/2018), Kidney stone, Kidney stone, Migraine, Orthostatic hypotension, Osteopenia (03/31/2012), Recurrent upper respiratory infection (URI), Ulcerative colitis (HCC) (03/31/2012), and Urticaria.   reports that she has never smoked. She has never used smokeless tobacco.  Past Surgical History:  Procedure Laterality Date   BIOPSY  01/25/2022   Procedure: BIOPSY;  Surgeon: Saintclair Jasper, MD;   Location: [facility];  Service: Gastroenterology;;   CESAREAN SECTION     x3   CHOLECYSTECTOMY     COLONOSCOPY     COLONOSCOPY N/A 01/25/2022   Procedure: COLONOSCOPY;  Surgeon: Saintclair Jasper, MD;  Location: [facility];  Service: Gastroenterology;  Laterality: N/A;   LITHOTRIPSY     THYROIDECTOMY     1997    Allergies  Allergen Reactions   Almond Oil Hives and Shortness Of Breath   Lialda  [Mesalamine ] Other (See Comments)    Vomiting and rectal bleeding   Other Anaphylaxis    Almonds, tree nuts   Aspirin Hives and Nausea Only    Stomach cramps   Hydromorphone  Hcl Other (See Comments)    Caused Enzyme levels to go up   Penicillins Hives and Swelling    Has patient had a PCN reaction causing immediate rash, facial/tongue/throat swelling, SOB or lightheadedness with hypotension: Yes Has patient had a PCN reaction causing severe rash involving mucus membranes or skin necrosis: No Has patient had a PCN reaction that required hospitalization: No Has patient had a PCN reaction occurring within the last 10 years: No  Stomach cramps If all of the above answers are NO, then may proceed with Cephalosporin use.    Acetaminophen  Other (See Comments)    Has autoimmune hepatitis, wants to  avoid APAP   Apriso  [Mesalamine  Er] Nausea And Vomiting   Corticosteroids Other (See Comments)    Had acute pancreatitis x 2 after steroid use.   Imitrex [Sumatriptan] Swelling    Injection caused swelling   Stadol [Butorphanol] Other (See Comments)    hallucinates   Topamax  [Topiramate ] Other (See Comments)    Kidney stones   Vancomycin Hives   Azathioprine Rash   Pepcid [Famotidine] Nausea And Vomiting    Makes acid worse   Prednisone  Nausea And Vomiting    All steroids causes nausea and acute pancreatitis     Immunization History  Administered Date(s) Administered   Fluad Quad(high Dose 65+) 04/01/2021   Fluzone Influenza virus vaccine,trivalent (IIV3), split virus 05/02/2015   Influenza  Inj Mdck Quad Pf 03/23/2018, 04/01/2022   Influenza Inj Mdck Quad With Preservative 05/01/2019   Influenza Split 04/10/2012, 04/24/2013   Influenza, Seasonal, Injecte, Preservative Fre 04/15/2014, 04/29/2023   Influenza,inj,Quad PF,6+ Mos 03/23/2020   Influenza,inj,quad, With Preservative 05/02/2015   Influenza,trivalent, recombinat, inj, PF 04/10/2012, 04/24/2013   Influenza-Unspecified 04/22/2020   Moderna Covid-19 Fall Seasonal Vaccine 60yrs & older 04/12/2022, 11/01/2022, 04/01/2023, 10/18/2023   Moderna Covid-19 Vaccine Bivalent Booster 36yrs & up 04/24/2021   Moderna Sars-Covid-2 Vaccination 10/09/2019, 11/09/2019, 05/16/2020, 11/13/2020   PPD Test 10/05/2013   Pfizer(Comirnaty)Fall Seasonal Vaccine 12 years and older 04/12/2022   Pneumococcal Polysaccharide-23 04/22/2008, 04/23/2013, 05/24/2020   Tdap 07/23/2004, 07/22/2012, 09/24/2023   Varicella 07/22/2012   Zoster Recombinant(Shingrix ) 09/18/2022, 11/20/2022   Zoster, Unspecified 11/20/2022    Family History  Problem Relation Age of Onset   Heart disease Mother    Dementia Mother    Thyroid  disease Mother    Osteoarthritis Mother    Asthma Mother    Heart attack Father    Heart attack Brother    Heart disease Brother    Colon cancer Maternal Aunt        dx 46s   Ovarian cancer Paternal Aunt 75   Prostate cancer Paternal Uncle        metastatic; dx 59s   Heart disease Paternal Grandmother    Crohn's disease Daughter 72   Breast cancer Cousin        paternal female cousin; dx 30s   Colon cancer Cousin 58       paternal female cousin   Stomach cancer Neg Hx    Rectal cancer Neg Hx    Esophageal cancer Neg Hx    Liver cancer Neg Hx    Pancreatic cancer Neg Hx      Current Outpatient Medications:    albuterol  (PROVENTIL ) (2.5 MG/3ML) 0.083% nebulizer solution, Take 3 mLs (2.5 mg total) by nebulization every 6 (six) hours as needed for wheezing or shortness of breath., Disp: 150 mL, Rfl: 1   albuterol  (VENTOLIN   HFA) 108 (90 Base) MCG/ACT inhaler, TAKE 2 PUFFS BY MOUTH EVERY 6 HOURS AS NEEDED FOR WHEEZE OR SHORTNESS OF BREATH, Disp: 25.5 each, Rfl: 7   budesonide -formoterol  (SYMBICORT ) 160-4.5 MCG/ACT inhaler, Inhale 1 puff into the lungs 2 (two) times daily., Disp: , Rfl:    cholecalciferol (VITAMIN D3) 25 MCG (1000 UNIT) tablet, Take 2,000 Units by mouth daily., Disp: , Rfl:    CVS VITAMIN B12 1000 MCG tablet, Take 1,000 mcg by mouth daily., Disp: , Rfl:    Efinaconazole  (JUBLIA ) 10 % SOLN, Apply 1 Application topically daily., Disp: 5 mL, Rfl: 5   EPINEPHrine  0.3 mg/0.3 mL IJ SOAJ injection, Inject 0.3 mg into the muscle as  needed for anaphylaxis., Disp: 2 each, Rfl: 1   folic acid  (FOLVITE ) 1 MG tablet, TAKE 1 TABLET BY MOUTH EVERY DAY FOR 30 DAYS, Disp: 90 tablet, Rfl: 1   levothyroxine  (SYNTHROID ) 100 MCG tablet, Take 1 tablet (100 mcg total) by mouth daily. Brand name only (Synthroid ), Disp: 30 tablet, Rfl: 3   Magnesium  500 MG TABS, Take 500 mg by mouth daily., Disp: , Rfl:    metoprolol  succinate (TOPROL -XL) 50 MG 24 hr tablet, Take 1 tablet (50 mg total) by mouth at bedtime., Disp: 90 tablet, Rfl: 2   Multiple Vitamin (MULTIVITAMIN WITH MINERALS) TABS tablet, Take 1 tablet by mouth daily., Disp: , Rfl:    NON FORMULARY, Goli Beet, Disp: , Rfl:    Omega 3-6-9 Fatty Acids (OMEGA 3-6-9 PO), Take 1 tablet by mouth daily., Disp: , Rfl:    polyethylene glycol (MIRALAX ) 17 g packet, Use once or twice daily, Disp: 14 each, Rfl: 0   RINVOQ  30 MG TB24, Take 1 tablet (30 mg total) by mouth daily., Disp: 90 tablet, Rfl: 1   rosuvastatin  (CRESTOR ) 20 MG tablet, Take 1 tablet (20 mg total) by mouth daily., Disp: 90 tablet, Rfl: 2   valACYclovir  (VALTREX ) 500 MG tablet, 1 tab bid x 3 days for new symptoms, Disp: 30 tablet, Rfl: 0   vitamin C (ASCORBIC ACID) 500 MG tablet, Take 500 mg by mouth daily., Disp: , Rfl:    azithromycin  (ZITHROMAX ) 250 MG tablet, Take 2 tabs day 1, then 1 tab daily, Disp: 6 each, Rfl:  0   Cholecalciferol 1.25 MG (50000 UT) capsule, Take 1 capsule every week by oral route, for 8 weeks., Disp: , Rfl:    ezetimibe  (ZETIA ) 10 MG tablet, Take 1 tablet (10 mg total) by mouth daily., Disp: 90 tablet, Rfl: 3   metoprolol  tartrate (LOPRESSOR ) 100 MG tablet, Take 1 tablet (100 mg total) by mouth once for 1 dose. Take 90-120 minutes prior to scan. Hold for SBP less than 110., Disp: 1 tablet, Rfl: 0   ofloxacin  (FLOXIN ) 0.3 % OTIC solution, Place 5 drops into both ears daily., Disp: 5 mL, Rfl: 0      Objective:   Vitals:   03/24/24 0923  BP: 124/70  Pulse: 87  SpO2: 99%  Weight: 163 lb (73.9 kg)  Height: 5' (1.524 m)    Estimated body mass index is 31.83 kg/m as calculated from the following:   Height as of this encounter: 5' (1.524 m).   Weight as of this encounter: 163 lb (73.9 kg).  @WEIGHTCHANGE @  [employer]   03/24/24 0923  Weight: 163 lb (73.9 kg)     Physical Exam   General: No distress. Looks well O2 at rest: no Cane present: no Sitting in wheel chair: no Frail: no Obese: no Neuro: Alert and Oriented x 3. GCS 15. Speech normal Psych: Pleasant Resp:  Barrel Chest - no.  Wheeze - no, Crackles - no, No overt respiratory distress CVS: Normal heart sounds. Murmurs - no Ext: Stigmata of Connective Tissue Disease - no HEENT: Normal upper airway. PEERL +. No post nasal drip        Assessment/     Assessment & Plan Cough variant asthma  Moderate persistent asthma without complication    PLAN Patient Instructions     ICD-10-CM   1. Cough variant asthma  J45.991     2. Moderate persistent asthma without complication  J45.40        Asthma overall under control  thogh cough x 2 weeks as of 03/24/2024 could suggest mild uptick in activity. FENO 03/24/2024  Is normal  Plan -Cotninue symbicort  as before - 1 puff twice daily + as needd -Albuerol as needed - - flu shot   Oct 2025 - RSV vaccine later commercially  - ensure $ coverage - if  cough gets worse or does not go away please call and we can try short course steroids  Follow-up -9-12 months or sooner if needed; -  can do FeNO and ACT at followup depending on course    FOLLOWUP    Return in about 10 months (around 01/21/2025) for 15 min visit, with Dr Geronimo, Face to Face Visit.    SIGNATURE    Dr. Dorethia Geronimo, M.D., F.C.C.P,  Pulmonary and Critical Care Medicine Staff Physician, [facility] Center Director - Interstitial Lung Disease  Program  Pulmonary Fibrosis [facility] [place], [place], [address]  Pager: [phone], If no answer or between  15:00h - 7:00h: call [phone] Telephone: [phone]  9:43 AM 03/24/2024

## 2024-03-24 NOTE — Patient Instructions (Addendum)
 ICD-10-CM   1. Cough variant asthma  J45.991     2. Moderate persistent asthma without complication  J45.40        Asthma overall under control thogh cough x 2 weeks as of 03/24/2024 could suggest mild uptick in activity. FENO 03/24/2024  Is normal  Plan -Cotninue symbicort  as before - 1 puff twice daily + as needd -Albuerol as needed - - flu shot   Oct 2025 - RSV vaccine later commercially  - ensure $ coverage - if cough gets worse or does not go away please call and we can try short course steroids  Follow-up -9-12 months or sooner if needed; -  can do FeNO and ACT at followup depending on course

## 2024-03-25 ENCOUNTER — Telehealth: Payer: Self-pay

## 2024-03-25 ENCOUNTER — Other Ambulatory Visit: Payer: Self-pay

## 2024-03-25 DIAGNOSIS — K754 Autoimmune hepatitis: Secondary | ICD-10-CM

## 2024-03-25 DIAGNOSIS — Z79899 Other long term (current) drug therapy: Secondary | ICD-10-CM

## 2024-03-25 DIAGNOSIS — K51019 Ulcerative (chronic) pancolitis with unspecified complications: Secondary | ICD-10-CM

## 2024-03-25 NOTE — Telephone Encounter (Signed)
 Left message for patient to return my call.

## 2024-03-25 NOTE — Telephone Encounter (Signed)
 Patient returned my call and I informed her she is due for repeat labs. Patient reports she will come to our lab in the basement tomorrow. Lab orders placed in Epic.

## 2024-03-25 NOTE — Telephone Encounter (Signed)
-----   Message from [facility] Haskell H sent at 11/27/2023 11:59 AM EDT ----- Regarding: labs due in Sept CBC, CMET, IgG due in Sept

## 2024-03-27 ENCOUNTER — Telehealth: Payer: Self-pay | Admitting: *Deleted

## 2024-03-27 ENCOUNTER — Other Ambulatory Visit: Payer: Self-pay | Admitting: *Deleted

## 2024-03-27 MED ORDER — COVID-19 MRNA VACC (MODERNA) 50 MCG/0.5ML IM SUSP: 0.5000 mL | Freq: Once | INTRAMUSCULAR | 0 refills | Status: AC

## 2024-03-27 NOTE — Telephone Encounter (Signed)
 Copied from CRM #[id number]. Topic: General - Other >> Mar 27, 2024  3:20 PM Burnard DEL wrote: Reason for CRM: Patient called in asking could she have prescription sent to pharmacy for covid vaccine?  [id number] [place], [place] - [address]  Phone: [phone] Fax: [phone]   Rx send to pharmacy  [facility]

## 2024-04-02 ENCOUNTER — Ambulatory Visit: Payer: Self-pay

## 2024-04-02 NOTE — Telephone Encounter (Signed)
 FYI Only or Action Required?: FYI only for provider.  Patient was last seen in primary care on 12/20/2023 by Kennyth Worth HERO, MD.  Called Nurse Triage reporting Ear pressure.  Symptoms began several days ago.  Interventions attempted: Rest, hydration, or home remedies.  Symptoms are: stable.  Triage Disposition: See PCP When Office is Open (Within 3 Days)  Patient/caregiver understands and will follow disposition?: Yes  Reason for Disposition  [1] Ear congestion lasts > 3 days AND [2] no improvement after using Care Advice  (Exception: Ear congestion is a chronic symptom.)  Answer Assessment - Initial Assessment Questions Flu shot last week, Covid shot on Monday. States this has happened before, and notices this around when the weather is changing.   1. LOCATION: Which ear is involved?       Right ear  2. SENSATION: Describe how the ear feels. (e.g., stuffy, full, plugged).      Pressure, full/clogged. States hearing Karel karel wah sound  3. ONSET:  When did the ear symptoms start?       3 days ago  4. PAIN: Do you also have an earache? If Yes, ask: How bad is it? (Scale 0-10; none, mild, moderate or severe)     No, just pressure right now  5. CAUSE: What do you think is causing the ear congestion? (e.g., common cold, nasal allergies, recent flight, recent snorkeling)     Unsure  6. OTHER SYMPTOMS: Do you have any other symptoms? (e.g., ear drainage, hay fever symptoms such as sneezing or a clear nasal discharge; cold symptoms such as a cough or runny nose)     Runny nose, then gets crusty, headaches  Protocols used: Ear - Congestion-A-AH  Copied from CRM [id number]. Topic: Clinical - Pink Word Triage >> Apr 02, 2024  2:57 PM Tysheama G wrote: Reason for Triage: Patient stated she has pressure on right side of face/ear and head. And runny nose for 3 days now. She does have ear issues. Callback number [phone]

## 2024-04-02 NOTE — Telephone Encounter (Signed)
 Pt states that she already spoke to triage nurse.   Message from [place] G sent at 04/02/2024  2:58 PM EDT  Patient stated she has pressure on right side of face/ear and head. And runny nose for 3 days now. She does have ear issues. Callback number [phone]

## 2024-04-03 ENCOUNTER — Ambulatory Visit (INDEPENDENT_AMBULATORY_CARE_PROVIDER_SITE_OTHER): Admitting: Family Medicine

## 2024-04-03 ENCOUNTER — Encounter: Payer: Self-pay | Admitting: Family Medicine

## 2024-04-03 VITALS — BP 120/78 | HR 99 | Temp 97.3°F | Ht 60.0 in | Wt 164.2 lb

## 2024-04-03 DIAGNOSIS — I10 Essential (primary) hypertension: Secondary | ICD-10-CM

## 2024-04-03 DIAGNOSIS — J310 Chronic rhinitis: Secondary | ICD-10-CM

## 2024-04-03 DIAGNOSIS — J329 Chronic sinusitis, unspecified: Secondary | ICD-10-CM | POA: Diagnosis not present

## 2024-04-03 DIAGNOSIS — H6993 Unspecified Eustachian tube disorder, bilateral: Secondary | ICD-10-CM

## 2024-04-03 MED ORDER — AZITHROMYCIN 250 MG PO TABS: ORAL_TABLET | ORAL | 0 refills | Status: DC

## 2024-04-03 NOTE — Assessment & Plan Note (Signed)
 Follows with ENT.  She is currently taking Flonase  which does help though this is likely contributing some to her above otalgia.  Will be treating with azithromycin  as above.

## 2024-04-03 NOTE — Patient Instructions (Addendum)
 It was very nice to see you today!  VISIT SUMMARY: During your visit, we addressed your sinus and ear issues, particularly in your right ear, and discussed your elevated blood pressure. We also reviewed your general health maintenance, including recent vaccinations.  YOUR PLAN: CHRONIC SINUSITIS WITH CHRONIC RHINITIS AND EUSTACHIAN TUBE DYSFUNCTION: You have chronic sinusitis and rhinitis with Eustachian tube dysfunction, which are causing sinus pressure and ear discomfort, especially with weather changes. -Continue using Flonase  and saline washes to manage your symptoms.  RIGHT EAR EFFUSION AND INFLAMMATION: Your right ear is experiencing effusion and inflammation, leading to increased sensitivity to distant noises, throbbing, itching, and fullness. -Start taking azithromycin  as prescribed to prevent your symptoms from worsening.  ELEVATED BLOOD PRESSURE (TRANSIENT): Your elevated blood pressure is likely due to recent medication and your current illness, though it has been well-controlled previously. -We will recheck your blood pressure before you leave today. -Monitor your blood pressure at home and report any significant changes.  GENERAL HEALTH MAINTENANCE: We reviewed your recent flu and COVID vaccinations and emphasized the importance of staying up-to-date with your vaccinations. -Continue to follow public health guidelines and adhere to your vaccination schedule.  Return if symptoms worsen or fail to improve.   Take care, Dr Kennyth  PLEASE NOTE:  If you had any lab tests, please let us  know if you have not heard back within a few days. You may see your results on mychart before we have a chance to review them but we will give you a call once they are reviewed by us .   If we ordered any referrals today, please let us  know if you have not heard from their office within the next week.   If you had any urgent prescriptions sent in today, please check with the pharmacy within an hour  of our visit to make sure the prescription was transmitted appropriately.   Please try these tips to maintain a healthy lifestyle:  Eat at least 3 REAL meals and 1-2 snacks per day.  Aim for no more than 5 hours between eating.  If you eat breakfast, please do so within one hour of getting up.   Each meal should contain half fruits/vegetables, one quarter protein, and one quarter carbs (no bigger than a computer mouse)  Cut down on sweet beverages. This includes juice, soda, and sweet tea.   Drink at least 1 glass of water with each meal and aim for at least 8 glasses per day  Exercise at least 150 minutes every week.

## 2024-04-03 NOTE — Assessment & Plan Note (Signed)
 Initially elevated however at goal on recheck.  She will monitor at home and let us  know if persistently elevated.

## 2024-04-03 NOTE — Progress Notes (Signed)
   Danielle [place] is a 63 y.o. female who presents today for an office visit.  Assessment/Plan:  New/Acute Problems: Sinusitis/otalgia No red flags.  Consistent with previous sinusitis.  She is also developing right sided otalgia and may be developing an ear infection as well.  She has done well with azithromycin  in the past.  Will start this again today.  She will continue her other medications including Flonase .  Chronic Problems Addressed Today: Eustachian tube dysfunction Follows with ENT.  She is currently taking Flonase  which does help though this is likely contributing some to her above otalgia.  Will be treating with azithromycin  as above.  Primary hypertension Initially elevated however at goal on recheck.  She will monitor at home and let us  know if persistently elevated.      Subjective:  HPI:  See assessment / plan for status of chronic conditions.    Discussed the use of AI scribe software for clinical note transcription with the patient, who gave verbal consent to proceed.  History of Present Illness Danielle [place] is a 63 year old female who presents with sinus and ear issues.  She has been experiencing sinus and ear issues, primarily affecting her right ear, for the past few days. 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 Despite using saline washes and Flonase , her symptoms persist.  She has a history of small Eustachian tubes and tiny ear canals, which have previously caused ear issues. A recent tympanogram indicated sensitivity in her right ear. No fever or chills are present, and she has tested negative for COVID-19 twice recently.  Her current medications include Flonase  and medication for a racing heart, which she took prior to the visit, affecting her blood pressure  readings. She has previously used azithromycin  for ear issues. No fever, chills, or throat discomfort are reported, and her lungs are described as perfectly fine.         Objective:  Physical Exam: BP 120/78   Pulse 99   Temp (!) 97.3 F (36.3 C) (Temporal)   Ht 5' (1.524 m)   Wt 164 lb 3.2 oz (74.5 kg)   LMP 03/03/2013   SpO2 95%   BMI 32.07 kg/m   Gen: No acute distress, resting comfortably HEENT: Left TM clear effusion.  Right TM erythematous with effusion.  Nasal mucosa erythematous and boggy bilaterally with clear discharge.  Oropharynx erythematous.  No exudate. CV: Regular rate and rhythm with no murmurs appreciated Pulm: Normal work of breathing, clear to auscultation bilaterally with no crackles, wheezes, or rhonchi Neuro: Grossly normal, moves all extremities Psych: Normal affect and thought content      Deltha Bernales M. Kennyth, MD 04/03/2024 11:37 AM

## 2024-04-08 ENCOUNTER — Other Ambulatory Visit (INDEPENDENT_AMBULATORY_CARE_PROVIDER_SITE_OTHER)

## 2024-04-08 DIAGNOSIS — K51019 Ulcerative (chronic) pancolitis with unspecified complications: Secondary | ICD-10-CM | POA: Diagnosis not present

## 2024-04-08 DIAGNOSIS — K754 Autoimmune hepatitis: Secondary | ICD-10-CM

## 2024-04-08 DIAGNOSIS — Z79899 Other long term (current) drug therapy: Secondary | ICD-10-CM | POA: Diagnosis not present

## 2024-04-08 LAB — COMPREHENSIVE METABOLIC PANEL WITH GFR
ALT: 23 U/L (ref 0–35)
AST: 33 U/L (ref 0–37)
Albumin: 4.7 g/dL (ref 3.5–5.2)
Alkaline Phosphatase: 57 U/L (ref 39–117)
BUN: 13 mg/dL (ref 6–23)
CO2: 27 meq/L (ref 19–32)
Calcium: 9.6 mg/dL (ref 8.4–10.5)
Chloride: 105 meq/L (ref 96–112)
Creatinine, Ser: 0.76 mg/dL (ref 0.40–1.20)
GFR: 83.39 mL/min (ref >60.00)
Glucose, Bld: 112 mg/dL — ABNORMAL HIGH (ref 70–99)
Potassium: 3.9 meq/L (ref 3.5–5.1)
Sodium: 140 meq/L (ref 135–145)
Total Bilirubin: 0.7 mg/dL (ref 0.2–1.2)
Total Protein: 7.7 g/dL (ref 6.0–8.3)

## 2024-04-08 LAB — CBC WITH DIFFERENTIAL/PLATELET
Basophils Absolute: 0 K/uL (ref 0.0–0.1)
Basophils Relative: 0.5 % (ref 0.0–3.0)
Eosinophils Absolute: 0 K/uL (ref 0.0–0.7)
Eosinophils Relative: 1.2 % (ref 0.0–5.0)
HCT: 36.3 % (ref 36.0–46.0)
Hemoglobin: 12.2 g/dL (ref 12.0–15.0)
Lymphocytes Relative: 56.1 % — ABNORMAL HIGH (ref 12.0–46.0)
Lymphs Abs: 2.1 K/uL (ref 0.7–4.0)
MCHC: 33.5 g/dL (ref 30.0–36.0)
MCV: 88.8 fl (ref 78.0–100.0)
Monocytes Absolute: 0.4 K/uL (ref 0.1–1.0)
Monocytes Relative: 11.8 % (ref 3.0–12.0)
Neutro Abs: 1.1 K/uL — ABNORMAL LOW (ref 1.4–7.7)
Neutrophils Relative %: 30.4 % — ABNORMAL LOW (ref 43.0–77.0)
Platelets: 278 K/uL (ref 150.0–400.0)
RBC: 4.09 Mil/uL (ref 3.87–5.11)
RDW: 13.6 % (ref 11.5–15.5)
WBC: 3.8 K/uL — ABNORMAL LOW (ref 4.0–10.5)

## 2024-04-09 ENCOUNTER — Ambulatory Visit: Payer: Self-pay | Admitting: Gastroenterology

## 2024-04-09 DIAGNOSIS — K51019 Ulcerative (chronic) pancolitis with unspecified complications: Secondary | ICD-10-CM

## 2024-04-09 DIAGNOSIS — K754 Autoimmune hepatitis: Secondary | ICD-10-CM

## 2024-04-09 DIAGNOSIS — N950 Postmenopausal bleeding: Secondary | ICD-10-CM | POA: Diagnosis not present

## 2024-04-09 DIAGNOSIS — Z79899 Other long term (current) drug therapy: Secondary | ICD-10-CM

## 2024-04-09 LAB — IGG: IgG (Immunoglobin G), Serum: 1499 mg/dL (ref 600–1540)

## 2024-04-10 ENCOUNTER — Telehealth: Payer: Self-pay | Admitting: Gastroenterology

## 2024-04-10 NOTE — Telephone Encounter (Signed)
 Left message for patient to call back

## 2024-04-10 NOTE — Telephone Encounter (Signed)
 Inbound call from patient stating she would like to speak to Slater in regards to last blood work results and states why does she have to get more labs done on 04/27/24 since she hasn't received results to last lab work. Patient requesting a call and states she does not do MyChart if someone can call her back.  Please advise  Thank you

## 2024-04-12 DIAGNOSIS — H6691 Otitis media, unspecified, right ear: Secondary | ICD-10-CM | POA: Diagnosis not present

## 2024-04-13 NOTE — Telephone Encounter (Signed)
 Called and spoke to patient.  Read her the MyChart note from Dr. Leigh on 9-18 which she had not read.  She has a bad ear infection and had to start on a 2nd course of stronger antibiotics. I told her she does not need to go to the lab next week given this. She said she may still do the repeat CBC in a few weeks after she feels better.

## 2024-04-13 NOTE — Telephone Encounter (Signed)
 Got it, thank you Jan for clarifying for her

## 2024-04-15 ENCOUNTER — Telehealth: Payer: Self-pay | Admitting: *Deleted

## 2024-04-15 NOTE — Telephone Encounter (Signed)
 I appreciate the update.  She should continue the clindamycin.  If symptoms did not improve then we can try switching her to doxycycline  which she had earlier this year or referring her to see ENT.

## 2024-04-15 NOTE — Telephone Encounter (Signed)
 Copied from CRM [id number]. Topic: General - Other >> Apr 14, 2024  4:56 PM [name] wrote: Reason for CRM: Pt feeling a thumping feeling in ear, inflamed mastoid per urgent care. Put on clindomyocin. gastro dr did bloodwork and had low WBC, they said it was likely a bad infection. She said the inflammation has gone down since. They suggested she let pcp know. Urgent care notes in chart. Using ice and warm compresses. Requesting call back at [phone] for follow up.   See note  Danielle Bond

## 2024-04-16 ENCOUNTER — Other Ambulatory Visit: Payer: Self-pay | Admitting: *Deleted

## 2024-04-16 ENCOUNTER — Telehealth: Payer: Self-pay | Admitting: *Deleted

## 2024-04-16 MED ORDER — DOXYCYCLINE HYCLATE 100 MG PO TABS: 100.0000 mg | ORAL_TABLET | Freq: Two times a day (BID) | ORAL | 0 refills | Status: AC

## 2024-04-16 NOTE — Telephone Encounter (Signed)
 Spoke with patient, stated ear is pounding and painful   aware of antibiotic changes  Send new Rx Doxycyline to her pharmacy  Patient will call ENT for appt, if referral needed will let us  know to send a new referral

## 2024-04-16 NOTE — Telephone Encounter (Signed)
 Copied from CRM #[id number]. Topic: Clinical - Medical Advice >> Apr 16, 2024  9:13 AM Anairis L wrote: Reason for CRM: Patient is calling in because her right ear is still not doing an better. She wants some advise on what to do next.   Please give pt a call.   See previews note

## 2024-04-16 NOTE — Telephone Encounter (Signed)
 Danielle Bond

## 2024-04-17 DIAGNOSIS — H9201 Otalgia, right ear: Secondary | ICD-10-CM | POA: Diagnosis not present

## 2024-04-17 DIAGNOSIS — H6991 Unspecified Eustachian tube disorder, right ear: Secondary | ICD-10-CM | POA: Diagnosis not present

## 2024-04-17 DIAGNOSIS — M542 Cervicalgia: Secondary | ICD-10-CM | POA: Diagnosis not present

## 2024-04-17 DIAGNOSIS — H9011 Conductive hearing loss, unilateral, right ear, with unrestricted hearing on the contralateral side: Secondary | ICD-10-CM | POA: Diagnosis not present

## 2024-04-27 ENCOUNTER — Other Ambulatory Visit (INDEPENDENT_AMBULATORY_CARE_PROVIDER_SITE_OTHER)

## 2024-04-27 DIAGNOSIS — Z79899 Other long term (current) drug therapy: Secondary | ICD-10-CM | POA: Diagnosis not present

## 2024-04-27 DIAGNOSIS — K754 Autoimmune hepatitis: Secondary | ICD-10-CM | POA: Diagnosis not present

## 2024-04-27 DIAGNOSIS — K51019 Ulcerative (chronic) pancolitis with unspecified complications: Secondary | ICD-10-CM

## 2024-04-27 LAB — CBC WITH DIFFERENTIAL/PLATELET
Basophils Absolute: 0 K/uL (ref 0.0–0.1)
Basophils Relative: 0.4 % (ref 0.0–3.0)
Eosinophils Absolute: 0.1 K/uL (ref 0.0–0.7)
Eosinophils Relative: 1 % (ref 0.0–5.0)
HCT: 37.6 % (ref 36.0–46.0)
Hemoglobin: 12.7 g/dL (ref 12.0–15.0)
Lymphocytes Relative: 50.7 % — ABNORMAL HIGH (ref 12.0–46.0)
Lymphs Abs: 2.8 K/uL (ref 0.7–4.0)
MCHC: 33.9 g/dL (ref 30.0–36.0)
MCV: 89.1 fl (ref 78.0–100.0)
Monocytes Absolute: 0.7 K/uL (ref 0.1–1.0)
Monocytes Relative: 12.8 % — ABNORMAL HIGH (ref 3.0–12.0)
Neutro Abs: 1.9 K/uL (ref 1.4–7.7)
Neutrophils Relative %: 35.1 % — ABNORMAL LOW (ref 43.0–77.0)
Platelets: 305 K/uL (ref 150.0–400.0)
RBC: 4.23 Mil/uL (ref 3.87–5.11)
RDW: 13.7 % (ref 11.5–15.5)
WBC: 5.4 K/uL (ref 4.0–10.5)

## 2024-04-28 ENCOUNTER — Ambulatory Visit: Payer: Self-pay | Admitting: Gastroenterology

## 2024-05-01 ENCOUNTER — Ambulatory Visit

## 2024-05-01 ENCOUNTER — Telehealth: Payer: Self-pay | Admitting: *Deleted

## 2024-05-01 ENCOUNTER — Telehealth: Payer: Self-pay

## 2024-05-01 NOTE — Telephone Encounter (Signed)
 Copied from CRM [id number]. Topic: Clinical - Lab/Test Results >> May 01, 2024  1:17 PM Danielle Bond ORN wrote: Reason for CRM: CBC results shown from gastro show lymph nodes need to be followed up with pcp. Pt calling to get clarification on next steps/ follow up. Please contact pt for guidance [phone] ([phone]   Need F/U appt with PCP  Danielle Bond

## 2024-05-01 NOTE — Telephone Encounter (Signed)
 Pt called and was transferred to the triage line. Message states that she is wanting to make an appt for left knee pain with Erin. Cb [phone]. I called pt and it went to VM. I advised that she can call back for apt [facility] has openings next week on Tuesday, Wednesday and Friday.

## 2024-05-05 ENCOUNTER — Encounter: Payer: Self-pay | Admitting: Family Medicine

## 2024-05-05 ENCOUNTER — Telehealth: Payer: Self-pay | Admitting: *Deleted

## 2024-05-05 ENCOUNTER — Ambulatory Visit (INDEPENDENT_AMBULATORY_CARE_PROVIDER_SITE_OTHER): Admitting: Family Medicine

## 2024-05-05 VITALS — BP 138/74 | HR 101 | Temp 97.5°F | Ht 60.0 in | Wt 165.3 lb

## 2024-05-05 DIAGNOSIS — J4540 Moderate persistent asthma, uncomplicated: Secondary | ICD-10-CM

## 2024-05-05 DIAGNOSIS — I10 Essential (primary) hypertension: Secondary | ICD-10-CM | POA: Diagnosis not present

## 2024-05-05 DIAGNOSIS — M2550 Pain in unspecified joint: Secondary | ICD-10-CM

## 2024-05-05 MED ORDER — ALBUTEROL SULFATE HFA 108 (90 BASE) MCG/ACT IN AERS: INHALATION_SPRAY | RESPIRATORY_TRACT | 7 refills | Status: AC

## 2024-05-05 NOTE — Assessment & Plan Note (Signed)
 Follows with orthopedics.  Having more pain in her left knee but will be seeing orthopedics soon.

## 2024-05-05 NOTE — Telephone Encounter (Signed)
 SABRA

## 2024-05-05 NOTE — Assessment & Plan Note (Signed)
 Overall stable.  Will refill her albuterol  inhaler.

## 2024-05-05 NOTE — Telephone Encounter (Signed)
 Noted

## 2024-05-05 NOTE — Telephone Encounter (Signed)
 Copied from CRM [id number]. Topic: General - Running Late >> May 05, 2024  2:21 PM Danielle Bond wrote: Patient/patient representative is calling because they are running late for an appointment.

## 2024-05-05 NOTE — Progress Notes (Signed)
   Danielle Bond is a 63 y.o. female who presents today for an office visit.  Assessment/Plan:  New/Acute Problems: Abnormal WBC Reviewed recent labs from GI which showed normalization of her WBC on recheck.  She did have slight abnormalities with relative neutrophils and lymphocytes with lymphocyte predominance however her absolute counts were all within normal ranges.  Reassured patient this likely does not indicate anything serious.  We can recheck again with next of the labs.  She is agreeable to this plan.  Chronic Problems Addressed Today: Arthralgia Follows with orthopedics.  Having more pain in her left knee but will be seeing orthopedics soon.  Primary hypertension Blood pressure at goal today without meds.  Asthma Overall stable.  Will refill her albuterol  inhaler.     Subjective:  HPI:  See assessment / plan for status of chronic conditions.   Discussed the use of AI scribe software for clinical note transcription with the patient, who gave verbal consent to proceed.  History of Present Illness Danielle Bond is a 63 year old female who presents for follow-up of low white blood cell and lymphocyte counts.  Approximately a month ago she had labs done with GI which showed WBC of 3.8. A follow-up test on October 6th showed normalization of the white blood cell count with lymphocyte predominance.  Her gastroenterologist recommended she follow-up here for ongoing management.  She has a history of ear infections treated with antibiotics, including clindamycin and doxycycline . She has experienced swollen lymph nodes in the past, particularly during ear infections.  She is currently taking Rinvoq , which is working well. She also requested a refill for her inhaler.  She mentions knee issues, including buckling and a fall, prompting a visit to her orthopedic doctor. She has a lump on her knee and is concerned about its impact on her ability to work out, as she uses the  treadmill every morning.        Objective:  Physical Exam: BP 138/74   Pulse (!) 101   Temp (!) 97.5 F (36.4 C) (Temporal)   Ht 5' (1.524 m)   Wt 165 lb 4.8 oz (75 kg)   LMP 03/03/2013   SpO2 98%   BMI 32.28 kg/m   Gen: No acute distress, resting comfortably Neuro: Grossly normal, moves all extremities Psych: Normal affect and thought content      Yair Dusza M. Kennyth, MD 05/05/2024 3:01 PM

## 2024-05-05 NOTE — Assessment & Plan Note (Signed)
 Blood pressure at goal today without meds.

## 2024-05-08 ENCOUNTER — Ambulatory Visit: Admitting: Family

## 2024-05-08 ENCOUNTER — Other Ambulatory Visit: Payer: Self-pay

## 2024-05-08 DIAGNOSIS — M25562 Pain in left knee: Secondary | ICD-10-CM

## 2024-05-08 NOTE — Progress Notes (Unsigned)
 Office Visit Note   Patient: Danielle Bond           Date of Birth: [birth year]           MRN: [id number] Visit Date: 05/08/2024              Requested by: Kennyth Worth HERO, MD [address] [place],  [place] [address] PCP: Kennyth Worth HERO, MD  Chief Complaint  Patient presents with   Left Knee - Pain      HPI: The patient is a 63 year old woman who presents today for left knee pain she reports this is new and different than the pain she was having back in March of this year.  She reports lateral knee pain which is deep increased discomfort at night difficulty getting comfortable in bed full extension as well as flexion are both painful she has had a fall due to giving way at the knee.  Assessment & Plan: Visit Diagnoses:  1. Acute pain of left knee     Plan: 20 mg Depo-Medrol  (per patient request) injected left knee.  Patient tolerated well.  Follow-Up Instructions: No follow-ups on file.   Left Knee Exam   Muscle Strength  The patient has normal left knee strength.  Tenderness  The patient is experiencing tenderness in the lateral joint line and medial joint line.  Range of Motion  The patient has normal left knee ROM.  Tests  Varus: negative Valgus: negative  Other  Erythema: absent Sensation: normal Pulse: present Swelling: none Effusion: no effusion present      Patient is alert, oriented, no adenopathy, well-dressed, normal affect, normal respiratory effort.     Imaging: No results found. No images are attached to the encounter.  Labs: Lab Results  Component Value Date   HGBA1C 6.4 09/24/2023   HGBA1C 6.4 07/12/2023   HGBA1C 6.1 12/18/2022   ESRSEDRATE 30 12/18/2022   ESRSEDRATE 94 (H) 01/24/2022   ESRSEDRATE 53 (H) 08/21/2021   CRP 4.4 (H) 01/24/2022   CRP 0.8 08/21/2021   CRP 85.3 (H) 06/26/2018   REPTSTATUS 03/12/2014 FINAL 03/10/2014   CULT  03/10/2014    Multiple bacterial morphotypes present, none predominant. Suggest  appropriate recollection if clinically indicated. Performed at [facility]   LABORGA NO GROWTH 03/22/2014     Lab Results  Component Value Date   ALBUMIN 4.7 04/08/2024   ALBUMIN 4.5 12/26/2023   ALBUMIN 4.7 10/11/2023    Lab Results  Component Value Date   MG 2.1 09/24/2023   MG 2.0 09/18/2022   MG 2.0 06/28/2022   Lab Results  Component Value Date   VD25OH 44.87 03/17/2024   VD25OH 51.87 09/24/2023   VD25OH 26.21 (L) 09/18/2022    No results found for: PREALBUMIN    Latest Ref Rng & Units 04/27/2024    1:08 PM 04/08/2024   10:56 AM 12/26/2023   12:16 PM  CBC EXTENDED  WBC 4.0 - 10.5 K/uL 5.4  3.8  5.0   RBC 3.87 - 5.11 Mil/uL 4.23  4.09  4.30   Hemoglobin 12.0 - 15.0 g/dL 87.2  87.7  87.2   HCT 36.0 - 46.0 % 37.6  36.3  38.1   Platelets 150.0 - 400.0 K/uL 305.0  278.0  310.0   NEUT# 1.4 - 7.7 K/uL 1.9  1.1  2.6   Lymph# 0.7 - 4.0 K/uL 2.8  2.1  1.6      There is no height or weight on file to calculate BMI.  Orders:  Orders Placed This Encounter  Procedures   XR Knee 1-2 Views Left   No orders of the defined types were placed in this encounter.    Procedures: Large Joint Inj on 05/12/2024 9:37 AM Indications: pain Details: 18 G 1.5 in needle, anteromedial approach Medications: 5 mL lidocaine  1 %; 20 mg methylPREDNISolone  acetate 40 MG/ML Consent was given by the patient.      Clinical Data: No additional findings.  ROS:  All other systems negative, except as noted in the HPI. Review of Systems  Objective: Vital Signs: LMP 03/03/2013   Specialty Comments:  INDICATION: Lumbar radiculopathy. Tingling sensation radiating down the RIGHT leg.   STUDY: MRI of the lumbar spine without intravenous contrast performed on 03/13/2023 10:37 AM.   COMPARISON: 11/08/2020.   TECHNIQUE: Multiplanar, multisequence MR imaging obtained through the lumbar spine without contrast on 03/13/2023 10:37 AM.   CONTRAST: None.   FINDINGS:  # Osseous  structures: Anterolateral osteophyte at T12-L1. Vertebral body heights are well-maintained. No acute fracture or concerning marrow signal abnormality appreciated.  #  Alignment:Mild levocurvature noted. No significant listhesis appreciated.  #  Conus medullaris/cauda equina: Spinal cord signal is normal and terminates at L1. The cauda equina is unremarkable.   #  Lower thoracic spine: Disc desiccation. No significant spinal canal or foraminal stenosis. This is viewed on the sagittal images only.   #  T12-L1: Disc desiccation with a small disc bulge and a superimposed LEFT paracentral disc protrusion. This effaces the ventral thecal sac. Neuroforamen are patent. No significant interval change.  #  L1-L2: Mild disc desiccation with a minimal disc bulge. No significant stenosis. This is viewed on the sagittal images only and is without significant interval change.  #  L2-L3: Mild disc desiccation with a small disc bulge. No significant stenosis. This is unchanged.  #  L3-L4: Mild disc desiccation with a small disc bulge. No significant stenosis. This is unchanged.  #  L4-L5: Mild disc desiccation with a small disc bulge and facet arthrosis. No significant stenosis. This is unchanged.  #  L5-S1: Mild disc desiccation with a posterior disc herniation that is more pronounced on the LEFT. Mild facet arthrosis. No significant stenosis. This is unchanged.   #  Paraspinal tissues: Unremarkable.   #  Additional comments: None.    IMPRESSION:  1. No acute process or significant interval change.  2.  Mild degenerative changes as described above. No significant nerve root encroachment.   Electronically Signed by: Sheree Daring MD, PHD on 03/18/2023 8:43 AM  PMFS History: Patient Active Problem List   Diagnosis Date Noted   Primary hypertension 11/19/2023   Mixed hyperlipidemia 09/04/2023   Prediabetes 09/20/2022   Chronic cough 08/07/2022   Abnormal CT of the chest 08/07/2022   Chronic rhinitis  03/21/2022   Onychomycosis 01/30/2022   Arthralgia 07/19/2021   Immunosuppressed status 07/19/2021   Eustachian tube dysfunction 06/13/2021   Osteoporosis 07/11/2020   Atherosclerosis of native coronary artery of native heart without angina pectoris 06/08/2019   Aortic atherosclerosis 06/08/2019   Dyslipidemia 05/02/2019   Postural dizziness with presyncope 05/02/2019   HSV-2 (herpes simplex virus 2) infection 11/04/2018   Hypothyroidism s/p total thyroidectomy 1997 08/15/2018   Autoimmune hepatitis (HCC) 08/16/2012   Asthma 03/31/2012   Chronic ulcerative pancolitis (HCC) 03/31/2012   History of renal calculi 03/31/2012   Migraines 03/31/2012   Mitral valve prolapse    Past Medical History:  Diagnosis Date   Asthma    Autoimmune  hepatitis (HCC)    Chronic kidney disease    Fatty liver 2023   CT   Hx of pancreatitis 06/16/2018   Kidney stone    Kidney stone    Migraine    Orthostatic hypotension    Osteopenia 03/31/2012   Recurrent upper respiratory infection (URI)    Ulcerative colitis (HCC) 03/31/2012   Urticaria     Family History  Problem Relation Age of Onset   Heart disease Mother    Dementia Mother    Thyroid  disease Mother    Osteoarthritis Mother    Asthma Mother    Heart attack Father    Heart attack Brother    Heart disease Brother    Colon cancer Maternal Aunt        dx 8s   Ovarian cancer Paternal Aunt 36   Prostate cancer Paternal Uncle        metastatic; dx 91s   Heart disease Paternal Grandmother    Crohn's disease Daughter 95   Breast cancer Cousin        paternal female cousin; dx 30s   Colon cancer Cousin 65       paternal female cousin   Stomach cancer Neg Hx    Rectal cancer Neg Hx    Esophageal cancer Neg Hx    Liver cancer Neg Hx    Pancreatic cancer Neg Hx     Past Surgical History:  Procedure Laterality Date   BIOPSY  01/25/2022   Procedure: BIOPSY;  Surgeon: Saintclair Jasper, MD;  Location: [facility];  Service: Gastroenterology;;    CESAREAN SECTION     x3   CHOLECYSTECTOMY     COLONOSCOPY     COLONOSCOPY N/A 01/25/2022   Procedure: COLONOSCOPY;  Surgeon: Saintclair Jasper, MD;  Location: [facility];  Service: Gastroenterology;  Laterality: N/A;   LITHOTRIPSY     THYROIDECTOMY     1997   Social History   Occupational History   Occupation: Self-employed  Tobacco Use   Smoking status: Never   Smokeless tobacco: Never  Vaping Use   Vaping status: Never Used  Substance and Sexual Activity   Alcohol  use: No   Drug use: No   Sexual activity: Yes

## 2024-05-12 ENCOUNTER — Encounter: Payer: Self-pay | Admitting: Family

## 2024-05-12 DIAGNOSIS — M25562 Pain in left knee: Secondary | ICD-10-CM | POA: Diagnosis not present

## 2024-05-12 MED ORDER — LIDOCAINE HCL 1 % IJ SOLN
5.0000 mL | INTRAMUSCULAR | Status: AC | PRN
  Administered 2024-05-12: 5 mL

## 2024-05-12 MED ORDER — METHYLPREDNISOLONE ACETATE 40 MG/ML IJ SUSP
20.0000 mg | INTRAMUSCULAR | Status: AC | PRN
  Administered 2024-05-12: 20 mg via INTRA_ARTICULAR

## 2024-05-15 ENCOUNTER — Telehealth: Payer: Self-pay | Admitting: Family

## 2024-05-15 NOTE — Telephone Encounter (Signed)
 Patient called. She says she is in a lot of pain. What else could be done?

## 2024-05-19 ENCOUNTER — Other Ambulatory Visit: Payer: Self-pay | Admitting: Family

## 2024-05-19 DIAGNOSIS — M25562 Pain in left knee: Secondary | ICD-10-CM

## 2024-05-19 NOTE — Telephone Encounter (Signed)
 Perfect thanks

## 2024-05-19 NOTE — Telephone Encounter (Signed)
 Order placed for MRI

## 2024-05-23 ENCOUNTER — Ambulatory Visit
Admission: RE | Admit: 2024-05-23 | Discharge: 2024-05-23 | Disposition: A | Discharge status: Home / Self Care | Source: Ambulatory Visit | Attending: Family | Admitting: Family

## 2024-05-23 DIAGNOSIS — M25562 Pain in left knee: Secondary | ICD-10-CM

## 2024-05-25 ENCOUNTER — Telehealth: Payer: Self-pay | Admitting: Family

## 2024-05-25 ENCOUNTER — Encounter: Payer: Self-pay | Admitting: Radiology

## 2024-05-25 NOTE — Telephone Encounter (Signed)
 Patient called and wants to know if you could give her the results over the phone because her L knee is in so much pain she can't walk. CB#

## 2024-05-28 ENCOUNTER — Ambulatory Visit (INDEPENDENT_AMBULATORY_CARE_PROVIDER_SITE_OTHER): Admitting: Family Medicine

## 2024-05-28 ENCOUNTER — Encounter: Payer: Self-pay | Admitting: Family Medicine

## 2024-05-28 ENCOUNTER — Telehealth: Payer: Self-pay | Admitting: Family Medicine

## 2024-05-28 VITALS — BP 122/82 | HR 77 | Temp 97.7°F | Resp 14 | Ht 60.0 in | Wt 163.0 lb

## 2024-05-28 DIAGNOSIS — K51019 Ulcerative (chronic) pancolitis with unspecified complications: Secondary | ICD-10-CM

## 2024-05-28 DIAGNOSIS — J029 Acute pharyngitis, unspecified: Secondary | ICD-10-CM | POA: Diagnosis not present

## 2024-05-28 DIAGNOSIS — I10 Essential (primary) hypertension: Secondary | ICD-10-CM | POA: Diagnosis not present

## 2024-05-28 DIAGNOSIS — M2550 Pain in unspecified joint: Secondary | ICD-10-CM | POA: Diagnosis not present

## 2024-05-28 DIAGNOSIS — J329 Chronic sinusitis, unspecified: Secondary | ICD-10-CM | POA: Insufficient documentation

## 2024-05-28 LAB — POCT RAPID STREP A (OFFICE): Rapid Strep A Screen: NEGATIVE

## 2024-05-28 MED ORDER — AZITHROMYCIN 250 MG PO TABS: ORAL_TABLET | ORAL | 0 refills | Status: DC

## 2024-05-28 MED ORDER — PROMETHAZINE-DM 6.25-15 MG/5ML PO SYRP: 5.0000 mL | ORAL_SOLUTION | Freq: Four times a day (QID) | ORAL | 0 refills | Status: DC | PRN

## 2024-05-28 NOTE — Assessment & Plan Note (Signed)
 Follows with orthopedics.  She had recent MRI on left knee which showed degenerative changes and will be following up with orthopedics tomorrow to discuss next [facility] management.  We did discuss conservative measures including compression sleeves and frequent use of ice

## 2024-05-28 NOTE — Progress Notes (Signed)
   Danielle Bond is a 63 y.o. female who presents today for an office visit.  Assessment/Plan:  Chronic Problems Addressed Today: No problem-specific Assessment & Plan notes found for this encounter.     Subjective:  HPI:  See assessment / plan for status of chronic conditions.   Discussed the use of AI scribe software for clinical note transcription with the patient, who gave verbal consent to proceed.  History of Present Illness Danielle Bond is a 63 year old female who presents with sore throat, cough, and fever.  Symptoms began on Tuesday night with a sensation of 'slimy jelly' covering the back of her throat, followed by a fiery and scraping feeling. Salt water rinses provided temporary relief. By the following day, she developed a low-grade fever of 101F, chills, and a productive cough with yellow mucus.  The cough is described as 'weird' and 'like a wheezy dog,' persistent and bothersome, especially at night. She has been using Delsym to manage the cough, which has provided some relief. She also experiences diarrhea, which she associates with her colitis flaring up during illness.  She has been experiencing sinus pressure for the past two days, described as 'unbearable.' She recalls a previous consultation with an ear doctor who noted inflammation in her mastoid area. She has a history of mastoid inflammation.  She has a history of knee issues, including a small Baker's cyst and degenerative changes in her meniscus, exacerbated by a fall last year. She received a partial injection in her knee a few weeks ago due to swelling and discomfort. She is scheduled to discuss MRI results with her orthopedic doctor tomorrow.  She is cautious about exposure to illnesses due to her and her daughter's compromised immune systems. Her daughter has Crohn's disease.         Objective:  Physical Exam: BP 122/82   Pulse 77   Temp 97.7 F (36.5 C) (Temporal)   Resp 14   Ht 5' (1.524  m)   Wt 163 lb (73.9 kg)   LMP 03/03/2013   SpO2 98%   BMI 31.83 kg/m   Gen: No acute distress, resting comfortably HEENT: Left TM clear.  Right TM erythematous.  OP erythematous.  No exudate CV: Regular rate and rhythm with no murmurs appreciated Pulm: Normal work of breathing, clear to auscultation bilaterally with no crackles, wheezes, or rhonchi Neuro: Grossly normal, moves all extremities Psych: Normal affect and thought content      Keasha Malkiewicz M. Kennyth, MD 05/28/2024 9:06 AM

## 2024-05-28 NOTE — Patient Instructions (Signed)
 It was very nice to see you today!  VISIT SUMMARY: Today, you were seen for a sore throat, cough, and fever. We discussed your symptoms and medical history, including your knee issues, chronic mastoiditis, and ulcerative colitis.  YOUR PLAN: ACUTE PHARYNGITIS WITH COUGH AND UPPER RESPIRATORY SYMPTOMS: You have a sore throat, productive cough, and mild fever. The strep test was negative, and your lungs are clear. There is a risk of sinus or ear infection progression. -Start taking the prescribed Z-Pak to prevent infection progression. -Continue using Delsym for your cough. -Use promethazine -containing cough syrup at night to help with your cough.  DEGENERATIVE JOINT DISEASE OF LEFT KNEE WITH EFFUSION: You have chronic left knee pain with swelling and degenerative changes, which may have been worsened by a fall. -Use knee compression sleeves to help with the swelling. -Continue using ice on your knee. -Follow the recommendations from your orthopedic doctor after your appointment tomorrow.  ULCERATIVE COLITIS IN REMISSION: Your ulcerative colitis is currently in remission with your current medication regimen. -Continue your current medication regimen to keep your ulcerative colitis in remission.  Return if symptoms worsen or fail to improve.   Take care, Dr Kennyth  PLEASE NOTE:  If you had any lab tests, please let us  know if you have not heard back within a few days. You may see your results on mychart before we have a chance to review them but we will give you a call once they are reviewed by us .   If we ordered any referrals today, please let us  know if you have not heard from their office within the next week.   If you had any urgent prescriptions sent in today, please check with the pharmacy within an hour of our visit to make sure the prescription was transmitted appropriately.   Please try these tips to maintain a healthy lifestyle:  Eat at least 3 REAL meals and 1-2 snacks per  day.  Aim for no more than 5 hours between eating.  If you eat breakfast, please do so within one hour of getting up.   Each meal should contain half fruits/vegetables, one quarter protein, and one quarter carbs (no bigger than a computer mouse)  Cut down on sweet beverages. This includes juice, soda, and sweet tea.   Drink at least 1 glass of water with each meal and aim for at least 8 glasses per day  Exercise at least 150 minutes every week.

## 2024-05-28 NOTE — Assessment & Plan Note (Signed)
 Rapid strep negative.  Symptoms consistent with previous episodes of sinusitis.  Likely started as a viral URI however does have some evidence of right-sided otitis media as well.  She has done well with azithromycin  in the past and we will start this today.  Will also send in promethazine  dextromethorphan cough syrup.  She can continue over-the-counter meds as needed.  We discussed reasons to return to care.  Follow-up as needed.

## 2024-05-28 NOTE — Assessment & Plan Note (Signed)
 Symptoms very well-controlled per gastroenterology.  She is on rinvoq .

## 2024-05-28 NOTE — Telephone Encounter (Signed)
 Patient requested a call from myself at check out.  I have left patient a vm to return my call.

## 2024-05-29 ENCOUNTER — Ambulatory Visit: Admitting: Family

## 2024-05-29 ENCOUNTER — Other Ambulatory Visit (INDEPENDENT_AMBULATORY_CARE_PROVIDER_SITE_OTHER): Payer: Self-pay

## 2024-05-29 ENCOUNTER — Encounter: Payer: Self-pay | Admitting: Family

## 2024-05-29 DIAGNOSIS — M79605 Pain in left leg: Secondary | ICD-10-CM

## 2024-05-29 DIAGNOSIS — M5416 Radiculopathy, lumbar region: Secondary | ICD-10-CM

## 2024-05-29 MED ORDER — PREDNISONE 10 MG PO TABS: 10.0000 mg | ORAL_TABLET | Freq: Every day | ORAL | 0 refills | Status: DC

## 2024-05-29 NOTE — Progress Notes (Signed)
 Office Visit Note   Patient: Danielle Bond           Date of Birth: 1961/01/09           MRN: [id number] Visit Date: 05/29/2024              Requested by: Kennyth Worth HERO, MD [address] [place],  [place] [address] PCP: Kennyth Worth HERO, MD  Chief Complaint  Patient presents with   Left Knee - Follow-up    MRI review      HPI: The patient is a 63 year old woman who presents in follow-up for ongoing left lateral knee pain.  She complains of buckling difficulty getting from a seated to a standing position and she has had an MRI of the left knee and presents today for review of the MRI  The MRI overall was unremarkable  Does undulate her knee pain does radiate into the anterior shin.  She has pain with full extension of the knee.  Assessment & Plan: Visit Diagnoses:  1. Lumbar radiculopathy   2. Pain in left leg     Plan: Discussed taking prednisone  burst.  She would like to do a taper she will begin with 40 mg of prednisone  today she will call or return if she fails to improve as expected  Follow-Up Instructions: Return in about 3 weeks (around 06/19/2024), or if symptoms worsen or fail to improve.   Left Knee Exam   Tenderness  The patient is experiencing tenderness in the lateral joint line.  Range of Motion  The patient has normal left knee ROM.   Back Exam   Tenderness  The patient is experiencing no tenderness.   Muscle Strength  The patient has normal back strength.  Tests  Straight leg raise left: positive      Patient is alert, oriented, no adenopathy, well-dressed, normal affect, normal respiratory effort.     Imaging: XR Lumbar Spine 2-3 Views Result Date: 05/29/2024 Radiographs of the lumbar spine show spondylolisthesis grade 1 L4 over L5.  Disc spaces are well-preserved.  No acute finding.  No images are attached to the encounter.  Labs: Lab Results  Component Value Date   HGBA1C 6.4 09/24/2023   HGBA1C 6.4 07/12/2023   HGBA1C  6.1 12/18/2022   ESRSEDRATE 30 12/18/2022   ESRSEDRATE 94 (H) 01/24/2022   ESRSEDRATE 53 (H) 08/21/2021   CRP 4.4 (H) 01/24/2022   CRP 0.8 08/21/2021   CRP 85.3 (H) 06/26/2018   REPTSTATUS 03/12/2014 FINAL 03/10/2014   CULT  03/10/2014    Multiple bacterial morphotypes present, none predominant. Suggest appropriate recollection if clinically indicated. Performed at [facility]   LABORGA NO GROWTH 03/22/2014     Lab Results  Component Value Date   ALBUMIN 4.7 04/08/2024   ALBUMIN 4.5 12/26/2023   ALBUMIN 4.7 10/11/2023    Lab Results  Component Value Date   MG 2.1 09/24/2023   MG 2.0 09/18/2022   MG 2.0 06/28/2022   Lab Results  Component Value Date   VD25OH 44.87 03/17/2024   VD25OH 51.87 09/24/2023   VD25OH 26.21 (L) 09/18/2022    No results found for: PREALBUMIN    Latest Ref Rng & Units 04/27/2024    1:08 PM 04/08/2024   10:56 AM 12/26/2023   12:16 PM  CBC EXTENDED  WBC 4.0 - 10.5 K/uL 5.4  3.8  5.0   RBC 3.87 - 5.11 Mil/uL 4.23  4.09  4.30   Hemoglobin 12.0 - 15.0 g/dL 12.7  12.2  12.7   HCT 36.0 - 46.0 % 37.6  36.3  38.1   Platelets 150.0 - 400.0 K/uL 305.0  278.0  310.0   NEUT# 1.4 - 7.7 K/uL 1.9  1.1  2.6   Lymph# 0.7 - 4.0 K/uL 2.8  2.1  1.6      There is no height or weight on file to calculate BMI.  Orders:  Orders Placed This Encounter  Procedures   XR Lumbar Spine 2-3 Views   Meds ordered this encounter  Medications   predniSONE  (DELTASONE ) 10 MG tablet    Sig: Take 1 tablet (10 mg total) by mouth daily with breakfast.    Dispense:  24 tablet    Refill:  0     Procedures: No procedures performed  Clinical Data: No additional findings.  ROS:  All other systems negative, except as noted in the HPI. Review of Systems  Objective: Vital Signs: LMP 03/03/2013   Specialty Comments:  INDICATION: Lumbar radiculopathy. Tingling sensation radiating down the RIGHT leg.   STUDY: MRI of the lumbar spine without intravenous  contrast performed on 03/13/2023 10:37 AM.   COMPARISON: 11/08/2020.   TECHNIQUE: Multiplanar, multisequence MR imaging obtained through the lumbar spine without contrast on 03/13/2023 10:37 AM.   CONTRAST: None.   FINDINGS:  # Osseous structures: Anterolateral osteophyte at T12-L1. Vertebral body heights are well-maintained. No acute fracture or concerning marrow signal abnormality appreciated.  #  Alignment:Mild levocurvature noted. No significant listhesis appreciated.  #  Conus medullaris/cauda equina: Spinal cord signal is normal and terminates at L1. The cauda equina is unremarkable.   #  Lower thoracic spine: Disc desiccation. No significant spinal canal or foraminal stenosis. This is viewed on the sagittal images only.   #  T12-L1: Disc desiccation with a small disc bulge and a superimposed LEFT paracentral disc protrusion. This effaces the ventral thecal sac. Neuroforamen are patent. No significant interval change.  #  L1-L2: Mild disc desiccation with a minimal disc bulge. No significant stenosis. This is viewed on the sagittal images only and is without significant interval change.  #  L2-L3: Mild disc desiccation with a small disc bulge. No significant stenosis. This is unchanged.  #  L3-L4: Mild disc desiccation with a small disc bulge. No significant stenosis. This is unchanged.  #  L4-L5: Mild disc desiccation with a small disc bulge and facet arthrosis. No significant stenosis. This is unchanged.  #  L5-S1: Mild disc desiccation with a posterior disc herniation that is more pronounced on the LEFT. Mild facet arthrosis. No significant stenosis. This is unchanged.   #  Paraspinal tissues: Unremarkable.   #  Additional comments: None.    IMPRESSION:  1. No acute process or significant interval change.  2.  Mild degenerative changes as described above. No significant nerve root encroachment.   Electronically Signed by: Sheree Daring MD, PHD on 03/18/2023 8:43 AM  PMFS  History: Patient Active Problem List   Diagnosis Date Noted   Recurrent sinusitis 05/28/2024   Primary hypertension 11/19/2023   Mixed hyperlipidemia 09/04/2023   Prediabetes 09/20/2022   Chronic cough 08/07/2022   Abnormal CT of the chest 08/07/2022   Chronic rhinitis 03/21/2022   Onychomycosis 01/30/2022   Arthralgia 07/19/2021   Immunosuppressed status 07/19/2021   Eustachian tube dysfunction 06/13/2021   Osteoporosis 07/11/2020   Atherosclerosis of native coronary artery of native heart without angina pectoris 06/08/2019   Aortic atherosclerosis 06/08/2019   Dyslipidemia 05/02/2019   Postural dizziness with  presyncope 05/02/2019   HSV-2 (herpes simplex virus 2) infection 11/04/2018   Hypothyroidism s/p total thyroidectomy 1997 08/15/2018   Autoimmune hepatitis (HCC) 08/16/2012   Asthma 03/31/2012   Chronic ulcerative pancolitis (HCC) 03/31/2012   History of renal calculi 03/31/2012   Migraines 03/31/2012   Mitral valve prolapse    Past Medical History:  Diagnosis Date   Asthma    Autoimmune hepatitis (HCC)    Chronic kidney disease    Fatty liver 2023   CT   Hx of pancreatitis 06/16/2018   Kidney stone    Kidney stone    Migraine    Orthostatic hypotension    Osteopenia 03/31/2012   Recurrent upper respiratory infection (URI)    Ulcerative colitis (HCC) 03/31/2012   Urticaria     Family History  Problem Relation Age of Onset   Heart disease Mother    Dementia Mother    Thyroid  disease Mother    Osteoarthritis Mother    Asthma Mother    Heart attack Father    Heart attack Brother    Heart disease Brother    Colon cancer Maternal Aunt        dx 42s   Ovarian cancer Paternal Aunt 32   Prostate cancer Paternal Uncle        metastatic; dx 3s   Heart disease Paternal Grandmother    Crohn's disease Daughter 22   Breast cancer Cousin        paternal female cousin; dx 30s   Colon cancer Cousin 41       paternal female cousin   Stomach cancer Neg Hx     Rectal cancer Neg Hx    Esophageal cancer Neg Hx    Liver cancer Neg Hx    Pancreatic cancer Neg Hx     Past Surgical History:  Procedure Laterality Date   BIOPSY  01/25/2022   Procedure: BIOPSY;  Surgeon: Saintclair Jasper, MD;  Location: [facility];  Service: Gastroenterology;;   CESAREAN SECTION     x3   CHOLECYSTECTOMY     COLONOSCOPY     COLONOSCOPY N/A 01/25/2022   Procedure: COLONOSCOPY;  Surgeon: Saintclair Jasper, MD;  Location: [facility];  Service: Gastroenterology;  Laterality: N/A;   LITHOTRIPSY     THYROIDECTOMY     1997   Social History   Occupational History   Occupation: Self-employed  Tobacco Use   Smoking status: Never   Smokeless tobacco: Never  Vaping Use   Vaping status: Never Used  Substance and Sexual Activity   Alcohol  use: No   Drug use: No   Sexual activity: Yes

## 2024-06-08 ENCOUNTER — Telehealth: Payer: Self-pay | Admitting: Family

## 2024-06-08 DIAGNOSIS — M5416 Radiculopathy, lumbar region: Secondary | ICD-10-CM

## 2024-06-08 DIAGNOSIS — M79605 Pain in left leg: Secondary | ICD-10-CM

## 2024-06-08 NOTE — Telephone Encounter (Signed)
 Pt called saying that there has been no change .  She said she has had an MRI already. Pt call back number is [phone].

## 2024-06-10 ENCOUNTER — Telehealth: Payer: Self-pay | Admitting: Family

## 2024-06-10 NOTE — Telephone Encounter (Signed)
 Patient called returning a call from [place]. Rocky had called her yesterday. CB#[phone]

## 2024-06-12 ENCOUNTER — Ambulatory Visit

## 2024-06-12 DIAGNOSIS — M9903 Segmental and somatic dysfunction of lumbar region: Secondary | ICD-10-CM | POA: Diagnosis not present

## 2024-06-12 DIAGNOSIS — M5136 Other intervertebral disc degeneration, lumbar region with discogenic back pain only: Secondary | ICD-10-CM | POA: Diagnosis not present

## 2024-06-12 DIAGNOSIS — M9904 Segmental and somatic dysfunction of sacral region: Secondary | ICD-10-CM | POA: Diagnosis not present

## 2024-06-12 DIAGNOSIS — M25562 Pain in left knee: Secondary | ICD-10-CM | POA: Diagnosis not present

## 2024-06-23 ENCOUNTER — Telehealth: Payer: Self-pay

## 2024-06-23 DIAGNOSIS — Z79899 Other long term (current) drug therapy: Secondary | ICD-10-CM

## 2024-06-23 DIAGNOSIS — K51019 Ulcerative (chronic) pancolitis with unspecified complications: Secondary | ICD-10-CM

## 2024-06-23 DIAGNOSIS — K754 Autoimmune hepatitis: Secondary | ICD-10-CM

## 2024-06-23 NOTE — Telephone Encounter (Signed)
-----   Message from [facility] sent at 12/30/2023  8:51 AM EDT ----- Regarding: labs due in Dec  CBC, CMET, and IgG due in early Dec

## 2024-06-23 NOTE — Telephone Encounter (Signed)
 MyChart message to patient to go to the lab in the next 2 weeks. Orders are in

## 2024-06-26 DIAGNOSIS — M5136 Other intervertebral disc degeneration, lumbar region with discogenic back pain only: Secondary | ICD-10-CM | POA: Diagnosis not present

## 2024-06-26 DIAGNOSIS — M25562 Pain in left knee: Secondary | ICD-10-CM | POA: Diagnosis not present

## 2024-06-26 DIAGNOSIS — M9903 Segmental and somatic dysfunction of lumbar region: Secondary | ICD-10-CM | POA: Diagnosis not present

## 2024-06-26 DIAGNOSIS — M9904 Segmental and somatic dysfunction of sacral region: Secondary | ICD-10-CM | POA: Diagnosis not present

## 2024-06-30 ENCOUNTER — Ambulatory Visit: Payer: Self-pay

## 2024-06-30 DIAGNOSIS — E782 Mixed hyperlipidemia: Secondary | ICD-10-CM | POA: Diagnosis not present

## 2024-06-30 NOTE — Telephone Encounter (Signed)
 Reviewed

## 2024-06-30 NOTE — Telephone Encounter (Signed)
 FYI Only or Action Required?: FYI only for provider: appointment scheduled on 07/01/24.  Patient was last seen in primary care on 05/28/2024 by Kennyth Worth HERO, MD.  Called Nurse Triage reporting Dizziness.  Symptoms began several weeks ago.  Interventions attempted: Nothing.  Symptoms are: stable.  Triage Disposition: See Physician Within 24 Hours  Patient/caregiver understands and will follow disposition?: Yes   Reason for Disposition  [1] NO dizziness now AND [2] one or more stroke risk factors (i.e., hypertension, diabetes, prior stroke/TIA/heart attack)  Answer Assessment - Initial Assessment Questions Patient says the day before Thanksgiving she had a sensation come over her that she says felt like the room was spinning and she was about to pass out, she yelled for her daughter and she helped her sit down. The episode passed she says after a few minutes. She says last night she was sitting down on the sofa and felt tingling on both sides of her face then she had the dizziness w/blurred vision a little and it lasted a couple of minutes. She says she stood slowly and walked up stairs to check her blood sugar, it was 157. She says she started drinking a lot of water and it came down to 100, her BP was normal she says. She didn't have another episode like that again. She says the chiropractor told her she was looking pale and told her to f/u with PCP for some testing of vitamin deficiency. She denies any dizziness today or any symptoms, such as facial tingling, numbness, blurred vision, weakness today. Advised first available with PCP is on 12/12, so I could schedule with a different provider today or tomorrow. She says she can come tomorrow.    1. DESCRIPTION: Describe your dizziness.     Room spinning  2. VERTIGO: Do you feel like either you or the room is spinning or tilting?      Yes  3. LIGHTHEADED: Do you feel lightheaded? (e.g., somewhat faint, woozy, weak upon standing)      Yes  4. SEVERITY: How bad is it?  Can you walk?     Was able to walk after the episode passed  5. ONSET:  When did the dizziness begin?     Once the day before Thanksgiving, then again last night    6. CAUSE: What do you think is causing the dizziness?     Unsure    7. OTHER SYMPTOMS: Do you have any other symptoms? (e.g., earache, headache, numbness, tinnitus, vomiting, weakness)     Swelling to the knees and legs (ongoing)  Protocols used: Dizziness - Vertigo-A-AH  Copied from CRM [id number]. Topic: Clinical - Red Word Triage >> Jun 30, 2024 10:20 AM Corin V wrote: [facility] that prompted transfer to Nurse Triage: The day before Thanksgiving patient was lightheaded and whole body leaving me, spinning room, dizziness, almost fell to floor. Yesterday she was sitting down and reading and the same issue happened. She has been having sinus issues and unsure if that is causing issues. She took her blood sugar after the episode yesterday and is was 157 and she drank a large amount of water to bring it down.  She saw the chiropractor last week for L4-L5 issues and they said she looked very pale and were worried about possible vitamin deficiencies. She had swelling on her right leg last night and it went away after treatments advised by chiropractor.

## 2024-07-01 ENCOUNTER — Ambulatory Visit: Admitting: Family

## 2024-07-01 ENCOUNTER — Encounter: Payer: Self-pay | Admitting: Family

## 2024-07-01 VITALS — BP 140/92 | HR 89 | Temp 97.5°F | Ht 60.0 in | Wt 162.5 lb

## 2024-07-01 DIAGNOSIS — R7303 Prediabetes: Secondary | ICD-10-CM | POA: Diagnosis not present

## 2024-07-01 DIAGNOSIS — R42 Dizziness and giddiness: Secondary | ICD-10-CM | POA: Diagnosis not present

## 2024-07-01 DIAGNOSIS — M25562 Pain in left knee: Secondary | ICD-10-CM | POA: Diagnosis not present

## 2024-07-01 DIAGNOSIS — M9904 Segmental and somatic dysfunction of sacral region: Secondary | ICD-10-CM | POA: Diagnosis not present

## 2024-07-01 DIAGNOSIS — M9903 Segmental and somatic dysfunction of lumbar region: Secondary | ICD-10-CM | POA: Diagnosis not present

## 2024-07-01 DIAGNOSIS — E038 Other specified hypothyroidism: Secondary | ICD-10-CM | POA: Diagnosis not present

## 2024-07-01 DIAGNOSIS — M5136 Other intervertebral disc degeneration, lumbar region with discogenic back pain only: Secondary | ICD-10-CM | POA: Diagnosis not present

## 2024-07-01 LAB — LIPID PANEL
Chol/HDL Ratio: 1.9 ratio (ref 0.0–4.4)
Cholesterol, Total: 210 mg/dL — ABNORMAL HIGH (ref 100–199)
HDL: 108 mg/dL (ref >39)
LDL Chol Calc (NIH): 85 mg/dL (ref 0–99)
Triglycerides: 97 mg/dL (ref 0–149)
VLDL Cholesterol Cal: 17 mg/dL (ref 5–40)

## 2024-07-01 MED ORDER — MECLIZINE HCL 12.5 MG PO TABS: 12.5000 mg | ORAL_TABLET | Freq: Three times a day (TID) | ORAL | 0 refills | Status: AC | PRN

## 2024-07-01 NOTE — Progress Notes (Signed)
 Patient ID: Danielle Bond, female    DOB: [birth year], 63 y.o.   MRN: [id number]  Chief Complaint  Patient presents with   Dizziness    Pt c/o falling after experiencing dizziness, vertigo and blurred vision around thanksgiving. On 12/8, facial tingling along with previous symptoms.   Discussed the use of AI scribe software for clinical note transcription with the patient, who gave verbal consent to proceed.  History of Present Illness Danielle Bond is a 63 year old female with vertigo who presents with dizziness and high blood pressure.  She experiences room-spinning vertigo with a fall the day before Thanksgiving, without head strike. The current episode feels like her prior vertigo. Dizziness recurred a few days ago while she was sitting, with associated facial tingling, and is worse when she lies down.  She has hypertension monitored at home with generally good readings and intermittent spikes when she feels unwell. She takes metoprolol  50 mg for palpitations/HR/HTN and reports having head pressure and ear symptoms. She took prednisone  for about a week one month ago and stopped due to hyperglycemia and feeling unwell. She has colitis since 1998 that tends to cause constipation and abdominal distension, but she denies current abdominal pain, nausea, constipation or diarrhea. She takes Crestor  for hyperlipidemia and prefers to limit additional medications. She reports several days of sinus pressure and pressure on top of her head and recently restarted her Flonase  with no benefit. She has thyroid  disease treated with 100 mcg of thyroid  medication daily and maintains regular physical activity. She takes daily vitamin D  and B12 for prior low vitamin D  and also a biotin-containing supplement. She monitors glucose at home and noted 157 mg/dL during a recent dizzy spell. Her last A1c was 6.4% 9 mos ago and treats this with lifestyle modifications. She notes a recent episode of leg swelling and  warmth that she relates to her autoimmune condition. She has migraines, including body migraines, and is cautious with medications due to prior adverse effects.  Assessment & Plan Vertigo Recurrent vertigo possibly related to autoimmune etiology. Differential includes vestibular dysfunction, hypertension, recent Prednisone  use. Meclizine discussed with potential drowsiness. Vestibular maneuvers and physical therapy discussed. - Check BMP to rule out other etiology. - Prescribed meclizine 12.5mg  tid prn for vertigo episodes. Caution for drowsiness. - Advise monitoring BP at home, notify office if running >135/90, either number. - Provided handout on vestibular maneuvers. - Can refer to physical therapy for vestibular rehabilitation if needed. - Call office if sx are persisting  Prediabetes Recent home blood sugar 157 mg/dL, J8r 3.5% 56m ago. No diabetes medication. Discussed lifestyle modifications and hereditary factors. - Will check glucose & A1C today. - Continue lifestyle modifications including diet and exercise. - Monitor blood sugar levels regularly.  Other specified hypothyroidism Long-standing hypothyroidism on levothyroxine  100 mcg daily. Last TSH borderline low. Possible biotin interference. - Recheck thyroid  hormone levels today. - Advised discontinuing biotin or skin/hair/nail supplements two days prior to future planned thyroid  testing.   Subjective:    Outpatient Medications Prior to Visit  Medication Sig Dispense Refill   albuterol  (PROVENTIL ) (2.5 MG/3ML) 0.083% nebulizer solution Take 3 mLs (2.5 mg total) by nebulization every 6 (six) hours as needed for wheezing or shortness of breath. 150 mL 1   albuterol  (VENTOLIN  HFA) 108 (90 Base) MCG/ACT inhaler TAKE 2 PUFFS BY MOUTH EVERY 6 HOURS AS NEEDED FOR WHEEZE OR SHORTNESS OF BREATH 25.5 each 7   azithromycin  (ZITHROMAX ) 250 MG tablet Take 2 tabs  day 1, then 1 tab daily 6 each 0   budesonide -formoterol  (SYMBICORT )  160-4.5 MCG/ACT inhaler Inhale 1 puff into the lungs 2 (two) times daily.     cholecalciferol (VITAMIN D3) 25 MCG (1000 UNIT) tablet Take 2,000 Units by mouth daily.     Cholecalciferol 1.25 MG (50000 UT) capsule Take 1 capsule every week by oral route, for 8 weeks.     [employer] VITAMIN B12 1000 MCG tablet Take 1,000 mcg by mouth daily.     Efinaconazole  (JUBLIA ) 10 % SOLN Apply 1 Application topically daily. 5 mL 5   EPINEPHrine  0.3 mg/0.3 mL IJ SOAJ injection Inject 0.3 mg into the muscle as needed for anaphylaxis. 2 each 1   ezetimibe  (ZETIA ) 10 MG tablet Take 1 tablet (10 mg total) by mouth daily. 90 tablet 3   folic acid  (FOLVITE ) 1 MG tablet TAKE 1 TABLET BY MOUTH EVERY DAY FOR 30 DAYS 90 tablet 1   levothyroxine  (SYNTHROID ) 100 MCG tablet Take 1 tablet (100 mcg total) by mouth daily. Brand name only (Synthroid ) 30 tablet 3   Magnesium  500 MG TABS Take 500 mg by mouth daily.     metoprolol  succinate (TOPROL -XL) 50 MG 24 hr tablet Take 1 tablet (50 mg total) by mouth at bedtime. 90 tablet 2   metoprolol  tartrate (LOPRESSOR ) 100 MG tablet Take 1 tablet (100 mg total) by mouth once for 1 dose. Take 90-120 minutes prior to scan. Hold for SBP less than 110. 1 tablet 0   Multiple Vitamin (MULTIVITAMIN WITH MINERALS) TABS tablet Take 1 tablet by mouth daily.     NON FORMULARY [place] Beet     Omega 3-6-9 Fatty Acids (OMEGA 3-6-9 PO) Take 1 tablet by mouth daily.     polyethylene glycol (MIRALAX ) 17 g packet Use once or twice daily 14 each 0   predniSONE  (DELTASONE ) 10 MG tablet Take 1 tablet (10 mg total) by mouth daily with breakfast. 24 tablet 0   promethazine -dextromethorphan (PROMETHAZINE -DM) 6.25-15 MG/5ML syrup Take 5 mLs by mouth 4 (four) times daily as needed. 118 mL 0   RINVOQ  30 MG TB24 Take 1 tablet (30 mg total) by mouth daily. 90 tablet 1   rosuvastatin  (CRESTOR ) 20 MG tablet Take 1 tablet (20 mg total) by mouth daily. 90 tablet 2   valACYclovir  (VALTREX ) 500 MG tablet 1 tab bid x 3 days for  new symptoms 30 tablet 0   vitamin C (ASCORBIC ACID) 500 MG tablet Take 500 mg by mouth daily.     lidocaine  (XYLOCAINE ) 5 % ointment APPLY TO AFFECTED AREA 1-4 TIMES DAILY AS NEEDED     ofloxacin  (FLOXIN ) 0.3 % OTIC solution Place 5 drops into both ears daily. 5 mL 0   No facility-administered medications prior to visit.   Past Medical History:  Diagnosis Date   Asthma    Autoimmune hepatitis (HCC)    Chronic kidney disease    Fatty liver 2023   CT   Hx of pancreatitis 06/16/2018   Kidney stone    Kidney stone    Migraine    Orthostatic hypotension    Osteopenia 03/31/2012   Recurrent upper respiratory infection (URI)    Ulcerative colitis (HCC) 03/31/2012   Urticaria    Past Surgical History:  Procedure Laterality Date   BIOPSY  01/25/2022   Procedure: BIOPSY;  Surgeon: Saintclair Jasper, MD;  Location: [facility];  Service: Gastroenterology;;   CESAREAN SECTION     x3   CHOLECYSTECTOMY     COLONOSCOPY  COLONOSCOPY N/A 01/25/2022   Procedure: COLONOSCOPY;  Surgeon: Saintclair Jasper, MD;  Location: [facility];  Service: Gastroenterology;  Laterality: N/A;   LITHOTRIPSY     THYROIDECTOMY     1997   Allergies  Allergen Reactions   Almond Oil Hives and Shortness Of Breath   Lialda  [Mesalamine ] Other (See Comments)    Vomiting and rectal bleeding   Other Anaphylaxis    Almonds, tree nuts   Aspirin Hives and Nausea Only    Stomach cramps   Hydromorphone  Hcl Other (See Comments)    Caused Enzyme levels to go up   Penicillins Hives and Swelling    Has patient had a PCN reaction causing immediate rash, facial/tongue/throat swelling, SOB or lightheadedness with hypotension: Yes Has patient had a PCN reaction causing severe rash involving mucus membranes or skin necrosis: No Has patient had a PCN reaction that required hospitalization: No Has patient had a PCN reaction occurring within the last 10 years: No  Stomach cramps If all of the above answers are NO, then may proceed  with Cephalosporin use.    Acetaminophen  Other (See Comments)    Has autoimmune hepatitis, wants to avoid APAP   Apriso  [Mesalamine  Er] Nausea And Vomiting   Corticosteroids Other (See Comments)    Had acute pancreatitis x 2 after steroid use.   Imitrex [Sumatriptan] Swelling    Injection caused swelling   Stadol [Butorphanol] Other (See Comments)    hallucinates   Topamax  [Topiramate ] Other (See Comments)    Kidney stones   Vancomycin Hives   Azathioprine Rash   Pepcid [Famotidine] Nausea And Vomiting    Makes acid worse   Prednisone  Nausea And Vomiting    All steroids causes nausea and acute pancreatitis       Objective:    Physical Exam Vitals and nursing note reviewed.  Constitutional:      Appearance: Normal appearance. She is obese.  Eyes:     Extraocular Movements: Extraocular movements intact.     Right eye: No nystagmus.     Left eye: No nystagmus.     Conjunctiva/sclera: Conjunctivae normal.  Cardiovascular:     Rate and Rhythm: Normal rate and regular rhythm.  Pulmonary:     Effort: Pulmonary effort is normal.     Breath sounds: Normal breath sounds.  Musculoskeletal:        General: Normal range of motion.  Skin:    General: Skin is warm and dry.  Neurological:     Mental Status: She is alert.  Psychiatric:        Mood and Affect: Mood normal.        Behavior: Behavior normal.    BP (!) 160/102 (BP Location: Left Arm, Patient Position: Sitting, Cuff Size: Large)   Pulse 89   Temp (!) 97.5 F (36.4 C) (Temporal)   Ht 5' (1.524 m)   Wt 162 lb 8 oz (73.7 kg)   LMP 03/03/2013   SpO2 98%   BMI 31.74 kg/m  Wt Readings from Last 3 Encounters:  07/01/24 162 lb 8 oz (73.7 kg)  05/28/24 163 lb (73.9 kg)  05/05/24 165 lb 4.8 oz (75 kg)      Lucius Krabbe, NP

## 2024-07-01 NOTE — Patient Instructions (Addendum)
 It was very nice to see you today!   I will review your lab results via MyChart in a few days.  I have sent over Meclizine you can try for your dizziness. Reminder it may cause drowsiness. See the handout attached.  Keep monitoring your blood pressure, it is high today. Continue drinking at least 2 liters of caffeine  free beverages daily. Reduce sodium in your diet as much as possible. Let us  know if your BP is running higher than 135/90, either number.     PLEASE NOTE:  If you had any lab tests please let us  know if you have not heard back within a few days. You may see your results on MyChart before we have a chance to review them but we will give you a call once they are reviewed by us . If we ordered any referrals today, please let us  know if you have not heard from their office within the next week.

## 2024-07-02 ENCOUNTER — Ambulatory Visit: Payer: Self-pay | Admitting: Family

## 2024-07-02 ENCOUNTER — Other Ambulatory Visit

## 2024-07-02 LAB — BASIC METABOLIC PANEL WITH GFR
BUN: 17 mg/dL (ref 6–23)
CO2: 27 meq/L (ref 19–32)
Calcium: 10.2 mg/dL (ref 8.4–10.5)
Chloride: 104 meq/L (ref 96–112)
Creatinine, Ser: 0.71 mg/dL (ref 0.40–1.20)
GFR: 90.34 mL/min (ref >60.00)
Glucose, Bld: 99 mg/dL (ref 70–99)
Potassium: 4.4 meq/L (ref 3.5–5.1)
Sodium: 138 meq/L (ref 135–145)

## 2024-07-02 LAB — TSH: TSH: 0.31 u[IU]/mL — ABNORMAL LOW (ref 0.35–5.50)

## 2024-07-02 LAB — T4, FREE: Free T4: 1.38 ng/dL (ref 0.60–1.60)

## 2024-07-03 ENCOUNTER — Other Ambulatory Visit

## 2024-07-03 LAB — HEMOGLOBIN A1C: Hgb A1c MFr Bld: 6.4 % (ref 4.6–6.5)

## 2024-07-06 ENCOUNTER — Telehealth: Payer: Self-pay | Admitting: Gastroenterology

## 2024-07-06 ENCOUNTER — Telehealth: Payer: Self-pay | Admitting: Cardiology

## 2024-07-06 DIAGNOSIS — K51019 Ulcerative (chronic) pancolitis with unspecified complications: Secondary | ICD-10-CM

## 2024-07-06 DIAGNOSIS — Z79899 Other long term (current) drug therapy: Secondary | ICD-10-CM

## 2024-07-06 NOTE — Telephone Encounter (Signed)
 Patient calls stating that over the last few months, she has been experiencing pain in her back, left leg and buckling in her knees. Pain then switched to right leg and she has some pain in her arms with some swelling and redness in her joints. States she had these exact same symptoms several years ago while on Humira  when it stopped working. She also says she recently had MRI of her knee and xrays of her back with no significant findings to explain her symptoms. Though she relates these symptoms to possible loss of response to Rinvoq , she denies any loose stool/diarrhea or increased stool frequency. No rectal bleeding or blood in stool. Has some occasional twisting pain in the abdomen and some increased reflux complaints.   I inquired with the patient to see if she has followed with her rheumatologist about these joint pains ect but she tells me she has never seen rheumatology (last office note indicates she has seen rheumatology before).  Patient is advised that she is due for a return office visit with Dr Leigh which we will schedule when I call her in follow up.  Dr Leigh, please advise with any thoughts/recommendations.

## 2024-07-06 NOTE — Telephone Encounter (Signed)
 Inbound call from patient stating she would like to speak to nurse in regards to her having the same pain she was having about 2 years ago when her humira  stopped working. Pain in her right side and pain going to her knee, her arms and legs going stiff.  Requesting a call back  Please advise  Thank you

## 2024-07-06 NOTE — Telephone Encounter (Signed)
 Left message for patient to call back

## 2024-07-06 NOTE — Telephone Encounter (Signed)
 Pt called to f/u on results please advise

## 2024-07-07 ENCOUNTER — Telehealth: Payer: Self-pay | Admitting: *Deleted

## 2024-07-07 NOTE — Telephone Encounter (Signed)
 Thanks Dottie - agree with office visit. Let's get a fecal calprotectin in the interim to assess for any active colitis. I am not convinced her colitis is driving her joints pains, but possible, fecal calprotectin will be helpful here. Can you help coordinate for her? Thanks

## 2024-07-07 NOTE — Telephone Encounter (Addendum)
 Copied from CRM [id number]. Topic: Clinical - Lab/Test Results >> Jul 06, 2024 12:00 PM Geneva B wrote: Reason for CRM: patient is calling in to go over her lab results please call pt back  [phone]   LVM to return call  [facility] with patient stated she still with dizziness, rt leg swelling and warm to the touch. Patient requesting Xray if is possible  Camren Lipsett,RMA

## 2024-07-07 NOTE — Telephone Encounter (Signed)
 Spoke to patient to advise of recommendations as per Dr Leigh. Lab order entered for calprotectin and patient states she will pick up kit today. She has also scheduled a follow up with Dr Leigh in the office for 08/03/24.

## 2024-07-08 NOTE — Telephone Encounter (Signed)
 Looks like she saw Corean a week ago and had labs - please forward to her about lab results.  It is okay to add an order for her x-ray if she is having pain and swelling though recommend she be seen again ASAP if symptoms are not improving.

## 2024-07-08 NOTE — Telephone Encounter (Signed)
 Unable to reach patient at this time. Closing encounter.

## 2024-07-08 NOTE — Telephone Encounter (Signed)
 LVM tok to do Xray, if no changes maintain visit schedule  Perel Hauschild,RMA

## 2024-07-10 ENCOUNTER — Encounter: Payer: Self-pay | Admitting: Family Medicine

## 2024-07-10 ENCOUNTER — Ambulatory Visit: Admitting: Family Medicine

## 2024-07-10 ENCOUNTER — Ambulatory Visit

## 2024-07-10 ENCOUNTER — Other Ambulatory Visit

## 2024-07-10 VITALS — BP 138/78 | HR 86 | Temp 97.4°F | Ht 60.0 in | Wt 164.0 lb

## 2024-07-10 DIAGNOSIS — R42 Dizziness and giddiness: Secondary | ICD-10-CM

## 2024-07-10 DIAGNOSIS — E039 Hypothyroidism, unspecified: Secondary | ICD-10-CM

## 2024-07-10 DIAGNOSIS — R7303 Prediabetes: Secondary | ICD-10-CM

## 2024-07-10 DIAGNOSIS — M25561 Pain in right knee: Secondary | ICD-10-CM

## 2024-07-10 DIAGNOSIS — K51019 Ulcerative (chronic) pancolitis with unspecified complications: Secondary | ICD-10-CM

## 2024-07-10 DIAGNOSIS — Z79899 Other long term (current) drug therapy: Secondary | ICD-10-CM | POA: Diagnosis not present

## 2024-07-10 DIAGNOSIS — M25461 Effusion, right knee: Secondary | ICD-10-CM | POA: Diagnosis not present

## 2024-07-10 NOTE — Telephone Encounter (Signed)
 Letter sent

## 2024-07-10 NOTE — Progress Notes (Signed)
 "  Danielle Bond is a 63 y.o. female who presents today for an office visit.  Assessment/Plan:  New/Acute Problems: Right Knee Pain  Exam consistent with osteoarthritis flare though does not have any previous imaging.  We will check an x-ray today to confirm and rule out other potential etiologies.  We discussed conservative management including compression, ice and over-the-counter meds.  She cannot take NSAIDs routinely due to her other medical issues however is okay for her to take Tylenol .  She does have some leftover prednisone  at home and discussed with patient it would be okay for her to start this though she is hesitant to do this due to her recent elevated A1c readings.  We also did discuss other potential treatment options including aspiration and steroid injection today as well as referral to physical therapy though she would like to hold off on this for now until she can get her x-ray report back.  Depending on results of x-ray may need to have her follow back up with orthopedics or physical therapy at that time.  Dizziness  Symptoms have resolved.  May have had some mild dehydration versus eustachian tube dysfunction.  She will let us  know if she has any recurrence  Chronic Problems Addressed Today: Hypothyroidism s/p total thyroidectomy 1997 Mildly decreased TSH a couple of weeks ago though this was in the setting of acute illness.  Overall she is feeling well.  She will continue her current dose of Synthroid  100 mcg daily and we can recheck in a few weeks  Prediabetes Had lengthy discussion regarding most recent A1c 6.4.  Discussed lifestyle modifications.  She will work on cutting down on sweets and carbs.     Subjective:  HPI:  See assessment / plan for status of chronic conditions.    Discussed the use of AI scribe software for clinical note transcription with the patient, who gave verbal consent to proceed.  History of Present Illness Danielle Bond is a 63 year  old female with osteoarthritis who presents with right knee swelling and pain.  She has experienced right knee swelling and pain for about a week and a half. The swelling is most pronounced in the morning, and the knee feels hot to the touch. Pain originates near the femur, and she struggles to bend down or squat without difficulty. She needs to hold onto the sink to stand up and has trouble getting out of the bath. Symptoms have remained consistent over the past few weeks.  She uses an Epsom salt roll-on for temporary relief, but it only provides short-term benefits. There have been no recent injuries to the knee. A dizzy spell occurred a couple of weeks ago, attributed to dehydration, but it has since resolved.  She previously had similar symptoms in her left knee, which were treated with prednisone  for two weeks, but she discontinued it due to elevated blood sugar levels. She has a history of osteoarthritis in her spine and has been receiving chiropractic care for her legs, which helped with her left leg.  Her current medications include Rinvoq , which she has been taking for her condition. She has a history of elevated blood sugar levels, which she attributes to previous prednisone  use. She also mentions a family history of rheumatoid arthritis, as her mother had it.  She maintains a healthy lifestyle, including daily power walking and a diet low in sugar and carbohydrates. She does not consume soda and uses agave instead of sugar in her tea. She consumes  a plant-based protein shake with wild berries every other day.         Objective:  Physical Exam: BP 138/78   Pulse 86   Temp (!) 97.4 F (36.3 C) (Temporal)   Ht 5' (1.524 m)   Wt 164 lb (74.4 kg)   LMP 03/03/2013   SpO2 98%   BMI 32.03 kg/m   Gen: No acute distress, resting comfortably CV: Regular rate and rhythm with no murmurs appreciated Pulm: Normal work of breathing, clear to auscultation bilaterally with no crackles,  wheezes, or rhonchi MUSCULOSKELETAL: - Left knee: No deformities.  Crepitus with active and passive range of motion - Right knee: No deformities.  Mild effusion noted.  Crepitus with active and passive range of motion.  Tenderness to palpation along medial and lateral joint lines.  Stable to varus and valgus stress.  Anterior and posterior drawer signs negative. Neuro: Grossly normal, moves all extremities Psych: Normal affect and thought content      Gloyd Happ M. Kennyth, MD 07/10/2024 3:13 PM  "

## 2024-07-10 NOTE — Assessment & Plan Note (Signed)
 Had lengthy discussion regarding most recent A1c 6.4.  Discussed lifestyle modifications.  She will work on cutting down on sweets and carbs.

## 2024-07-10 NOTE — Patient Instructions (Signed)
 It was very nice to see you today!  VISIT SUMMARY: You visited us  today due to swelling and pain in your right knee, which has been ongoing for about a week and a half. We discussed your osteoarthritis, lumbar spondylosis, hypothyroidism, prediabetes, and hyperlipidemia, and we have outlined a plan to manage these conditions.  YOUR PLAN: PRIMARY OSTEOARTHRITIS OF BILATERAL KNEES: You have chronic osteoarthritis with a recent flare-up in your right knee. -We will get an x-ray of your right knee to assess the severity of the arthritis. -Continue using a compression sleeve and alternate between ice and heat application. -If your symptoms worsen, we may consider a short course of low-dose prednisone . -If conservative measures do not help, we can discuss the potential for a cortisone injection. -If we suspect an autoimmune component, we will refer you to a rheumatologist.  HYPOTHYROIDISM: Your thyroid  function tests are well-managed but slightly difficult to interpret due to your current health issues. -We will recheck your thyroid  function tests in a few weeks.  PREDIABETES: Your A1c is approaching the diabetic range, likely exacerbated by previous prednisone  use. -Continue with your dietary modifications to reduce sugar and carbohydrate intake. -Keep up with your regular exercise, including power walking. -We will monitor your A1c levels regularly.  HYPERLIPIDEMIA: Your cholesterol levels are elevated, possibly influenced by your current medication regimen. -Continue with your dietary modifications to manage cholesterol levels. -We will monitor your lipid levels regularly.  Return if symptoms worsen or fail to improve.   Take care, Dr Kennyth  PLEASE NOTE:  If you had any lab tests, please let us  know if you have not heard back within a few days. You may see your results on mychart before we have a chance to review them but we will give you a call once they are reviewed by us .   If we  ordered any referrals today, please let us  know if you have not heard from their office within the next week.   If you had any urgent prescriptions sent in today, please check with the pharmacy within an hour of our visit to make sure the prescription was transmitted appropriately.   Please try these tips to maintain a healthy lifestyle:  Eat at least 3 REAL meals and 1-2 snacks per day.  Aim for no more than 5 hours between eating.  If you eat breakfast, please do so within one hour of getting up.   Each meal should contain half fruits/vegetables, one quarter protein, and one quarter carbs (no bigger than a computer mouse)  Cut down on sweet beverages. This includes juice, soda, and sweet tea.   Drink at least 1 glass of water with each meal and aim for at least 8 glasses per day  Exercise at least 150 minutes every week.

## 2024-07-10 NOTE — Assessment & Plan Note (Signed)
 Mildly decreased TSH a couple of weeks ago though this was in the setting of acute illness.  Overall she is feeling well.  She will continue her current dose of Synthroid  100 mcg daily and we can recheck in a few weeks

## 2024-07-13 ENCOUNTER — Ambulatory Visit: Payer: Self-pay | Admitting: Family Medicine

## 2024-07-13 NOTE — Progress Notes (Signed)
 Her xray confirms arthritis. She can continue with the treatment plan we discussed at her office visit, but she should let us  know if her symptoms are not improving.

## 2024-07-14 ENCOUNTER — Telehealth: Payer: Self-pay | Admitting: *Deleted

## 2024-07-14 ENCOUNTER — Telehealth: Payer: Self-pay | Admitting: Family Medicine

## 2024-07-14 NOTE — Telephone Encounter (Signed)
 Copied from CRM [id number]. Topic: Clinical - Lab/Test Results >> Jul 14, 2024 12:22 PM Shereese L wrote: Reason for CRM: Patient is requesting a call back to go over XRAYS results  CB#[phone]  See results note  Lakera Viall,RMA

## 2024-07-14 NOTE — Telephone Encounter (Signed)
 Patient requests to be called re: information Patient forgot to tell Elora about something that Dr. Kennyth and she discussed

## 2024-07-14 NOTE — Telephone Encounter (Signed)
 Patient requesting an appt with PCP for knee injection

## 2024-07-15 ENCOUNTER — Ambulatory Visit: Payer: Self-pay | Admitting: Cardiology

## 2024-07-15 ENCOUNTER — Other Ambulatory Visit: Payer: Self-pay | Admitting: Family Medicine

## 2024-07-16 LAB — CALPROTECTIN: Calprotectin: 98 ug/g

## 2024-07-17 ENCOUNTER — Ambulatory Visit: Payer: Self-pay | Admitting: Gastroenterology

## 2024-07-20 ENCOUNTER — Telehealth: Payer: Self-pay | Admitting: Cardiology

## 2024-07-20 NOTE — Telephone Encounter (Signed)
Patient returned RN's call regarding lab results.

## 2024-07-20 NOTE — Telephone Encounter (Signed)
 Spoke to patient and review cholesterol results.

## 2024-07-20 NOTE — Telephone Encounter (Signed)
 PT is calling to get stool results. I advised the patient that results were on her mychart and she stated that our staff knows that she does not use mychart and that she is now having opposite symptoms. Please advise.

## 2024-07-21 DIAGNOSIS — M25562 Pain in left knee: Secondary | ICD-10-CM | POA: Diagnosis not present

## 2024-07-21 DIAGNOSIS — M9903 Segmental and somatic dysfunction of lumbar region: Secondary | ICD-10-CM | POA: Diagnosis not present

## 2024-07-21 DIAGNOSIS — M9904 Segmental and somatic dysfunction of sacral region: Secondary | ICD-10-CM | POA: Diagnosis not present

## 2024-07-27 ENCOUNTER — Other Ambulatory Visit: Payer: Self-pay | Admitting: Gastroenterology

## 2024-07-31 ENCOUNTER — Ambulatory Visit: Admitting: Family Medicine

## 2024-07-31 ENCOUNTER — Encounter: Payer: Self-pay | Admitting: Family Medicine

## 2024-07-31 VITALS — BP 138/82 | HR 86 | Temp 97.3°F | Ht 60.0 in | Wt 163.0 lb

## 2024-07-31 DIAGNOSIS — R7303 Prediabetes: Secondary | ICD-10-CM

## 2024-07-31 DIAGNOSIS — M25561 Pain in right knee: Secondary | ICD-10-CM | POA: Diagnosis not present

## 2024-07-31 DIAGNOSIS — I10 Essential (primary) hypertension: Secondary | ICD-10-CM | POA: Diagnosis not present

## 2024-07-31 DIAGNOSIS — M2550 Pain in unspecified joint: Secondary | ICD-10-CM

## 2024-07-31 MED ORDER — METHYLPREDNISOLONE ACETATE 40 MG/ML IJ SUSP
20.0000 mg | Freq: Once | INTRAMUSCULAR | Status: AC
  Administered 2024-07-31: 20 mg via INTRA_ARTICULAR

## 2024-07-31 NOTE — Assessment & Plan Note (Signed)
Blood pressure at goal today without medications.

## 2024-07-31 NOTE — Progress Notes (Signed)
" ° °  Danielle Bond is a 64 y.o. female who presents today for an office visit.  Assessment/Plan:  Chronic Problems Addressed Today: Arthralgia Patient with worsening right knee pain over the last several weeks.  We did see her about a month ago for this however symptoms have continued to worsen despite oral prednisone  and conservative measures.  We discussed additional treatment options and she is interested in steroid injection today.  We also discussed aspiration however she deferred for today.  Steroid injection was performed today with a total of 20 mg of Depo-Medrol .  She tolerated well.  See above procedure note.  We discussed care after.  She will let us  know  Prediabetes Most recent A1c was 6.4.  She would like to take half a dose of Depo-Medrol  as above to prevent spiking her blood sugar.  We can recheck  A1c next blood draw  Primary hypertension Blood pressure at goal today without medications.     Subjective:  HPI:  See assessment / plan for status of chronic conditions.   Discussed the use of AI scribe software for clinical note transcription with the patient, who gave verbal consent to proceed.  History of Present Illness Danielle Bond is a 64 year old female who presents with worsening knee swelling and pain.  She has been experiencing worsening swelling and pain in her knee, described as excruciating and radiating up her femur into her groin. The swelling has been significant, particularly noted on New Year's Day, and has impacted her ability to walk. Despite icing the knee, the swelling persists.  She has been working with a land to address SI joint issues, which have been exacerbated by her knee problems. The knee pain has caused her to compromise her leg, affecting her SI joint and overall mobility.  She tried a course of prednisone , which was ineffective. She recalls a cortisone injection in the same knee last year that was effective. During her last visit  to ortho care in October, an x-ray was performed, but no fluid was drawn.  She expresses concern about the possibility of uric acid in the knee fluid and inquires about fluid aspiration and analysis. She is currently managing her condition with icing.         Objective:  Physical Exam: BP 138/82   Pulse 86   Temp (!) 97.3 F (36.3 C) (Temporal)   Ht 5' (1.524 m)   Wt 163 lb (73.9 kg)   LMP 03/03/2013   SpO2 98%   BMI 31.83 kg/m   Gen: No acute distress, resting comfortably  MUSCULOSKELETAL: - Right Knee: Mild effusion noted.  No gross abnormalities.  Tender palpation along medial joint line.  Stable varus and valgus stress.  Neurovascular intact distally. Neuro: Grossly normal, moves all extremities Psych: Normal affect and thought content  Knee Injection Procedure Note  Indication: Symptom relief of right Knee Pain.  Procedure Details  Verbal consent was obtained for the procedure. The joint was prepped with Betadine. Topical ethyl chloride was applied for anesthesia. A 22 gauge needle was inserted into the medial aspect of the joint.  3 ml 1% lidocaine  and 0.5 ml of 40mg /cc Depo-Medrol  was then injected into the joint. The needle was removed and the area cleansed and dressed.  Complications:  None; patient tolerated the procedure well.      Worth HERO. Kennyth, MD 07/31/2024 11:21 AM  "

## 2024-07-31 NOTE — Assessment & Plan Note (Signed)
 Patient with worsening right knee pain over the last several weeks.  We did see her about a month ago for this however symptoms have continued to worsen despite oral prednisone  and conservative measures.  We discussed additional treatment options and she is interested in steroid injection today.  We also discussed aspiration however she deferred for today.  Steroid injection was performed today with a total of 20 mg of Depo-Medrol .  She tolerated well.  See above procedure note.  We discussed care after.  She will let us  know

## 2024-07-31 NOTE — Assessment & Plan Note (Signed)
 Most recent A1c was 6.4.  She would like to take half a dose of Depo-Medrol  as above to prevent spiking her blood sugar.  We can recheck  A1c next blood draw

## 2024-07-31 NOTE — Patient Instructions (Signed)
 It was very nice to see you today!  VISIT SUMMARY: Today, you were seen for worsening swelling and pain in your right knee. We discussed your symptoms, previous treatments, and administered a cortisone injection to help alleviate the pain and swelling.  YOUR PLAN: RIGHT KNEE EFFUSION AND PAIN: You have been experiencing significant swelling and pain in your right knee, which has worsened recently. Previous treatments like prednisone  were ineffective, but a cortisone injection provided relief in the past. -Administered a cortisone injection to your right knee today. -If the cortisone injection is ineffective, we will consider aspirating fluid from your knee and analyzing it for uric acid. -Continue to manage your condition with icing as needed.  Return if symptoms worsen or fail to improve.   Take care, Dr Kennyth  PLEASE NOTE:  If you had any lab tests, please let us  know if you have not heard back within a few days. You may see your results on mychart before we have a chance to review them but we will give you a call once they are reviewed by us .   If we ordered any referrals today, please let us  know if you have not heard from their office within the next week.   If you had any urgent prescriptions sent in today, please check with the pharmacy within an hour of our visit to make sure the prescription was transmitted appropriately.   Please try these tips to maintain a healthy lifestyle:  Eat at least 3 REAL meals and 1-2 snacks per day.  Aim for no more than 5 hours between eating.  If you eat breakfast, please do so within one hour of getting up.   Each meal should contain half fruits/vegetables, one quarter protein, and one quarter carbs (no bigger than a computer mouse)  Cut down on sweet beverages. This includes juice, soda, and sweet tea.   Drink at least 1 glass of water with each meal and aim for at least 8 glasses per day  Exercise at least 150 minutes every week.

## 2024-08-02 NOTE — Progress Notes (Unsigned)
 "  HPI :  64 year old female here for a follow-up visit:   Ulcerative colitis history: Dx 2007.  Symptoms in the past have been left lower quadrant pain, diarrhea, rectal bleeding.  She has been on Lialda  and Apriso  in the past which did not control her symptoms.  She was placed on Humira  in March 2020 and did well on that regimen until July 2023 when she failed, admitted with a severe flare of her colitis-she had loss of weight, bleeding, diarrhea.  Treated with steroids, course complicated by C. difficile for which she had therapy for that recovered.  Of note it sounds like she had rash to Dificid  at that time. Eventually transitioned to Rinvoq  October 2023.  Of note it looks like she has an allergy reported to mesalamine , husband states it was not an allergy but she simply failed it.  She also has reported history of a rash to azathioprine in the past and hives to vancomycin.   Her daughter has Crohn's disease.    History of autoimmune hepatitis.   Admitted to the hospital in April 2014 with an elevated ALT of 1200.  At that time she had a markedly elevated IgG, ANA, smooth muscle antibody, as well as an AMA.  She did not think she was taking any new medicines at the time other than Dilaudid  given to her in the ED.  She had a liver biopsy at the time which was consistent with autoimmune hepatitis.  She was treated with steroids at that time but she states she is never had any follow-up for this particular issue.  Her liver enzymes have been relatively normal since then. CT scan in July of last year during time of her colitis flare which showed a normal-appearing liver.  She denies any family history of liver disease.  She has seen rheumatologist in the past for joint pains.  She has since followed up with [facility]ology and they recommended monitoring and checking labs every 6 months. ALT has been normal.      SINCE LAST VISIT:   Patient here for routine follow-up visit for her colitis  today.  She remains on Rinvoq  30 mg daily.  In a very general send she has done well since have last seen her in May 2025.  She reports good compliance from the Rinvoq  and is very happy with the regimen.  She has not had any flares of her symptoms.  She has some occasional discomfort in her left lower side that is relieved with a bowel movement.  Usually has 2-3 bowel movements per day without blood, stools are formed without diarrhea.  No urgency.  She has to use MiraLAX  every other day or as needed as she can get constipated.  She denies any NSAIDs on a routine basis.  Her vaccinations are all up-to-date, she has had the flu shot this year.  She is quite happy with how she is doing.  She inquires about a few supplements such as protein and fiber supplements that she was thinking about using and brought them into the office today which I reviewed.  Her last colonoscopy done in August 2024, there was no significant inflammation (only microscopic mild inflammation), she did have some pseudo inflammatory polyps.      Fecal calprotectin in December was 98, previously in the 20s.  We discussed if she wanted to pursue colonoscopy at some point for surveillance given longstanding colitis and history of pseudopolyposis.  Of note her labs have been  checked periodically, last done in September her LFTs and IgG remain normal.  She has not required any dedicated therapy for immune hepatitis.     Prior workup: Colonoscopy 01/25/2022 - Preparation of the colon was fair. - The examined portion of the ileum was normal. - Congested, erythematous, friable (with contact bleeding), hemorrhagic, inflamed, ulcerated and vascular-pattern-decreased mucosa in the entire examined colon. Biopsied.   Fecal calprotectin 01/2022 - > 3000     Echo 11/12/21: EF 60-65%, grade I MR, mild TR , no pulm HTN   Fecal calprotectin 01/15/23 - 129     Colonoscopy 03/06/23: - The perianal and digital rectal examinations were normal. -  The terminal ileum appeared normal. - A localized area of mildly erythematous mucosa was found at the hepatic flexure. - Anal papilla(e) were hypertrophied. - There were a few benign pseudoinflammatory polyps in the proximal left colon and distal transverse colon. The exam was otherwise without abnormality. No significant inflammation noted. - Biopsies were taken with a cold forceps for histology from the right, transverse, left colon, and rectum.   1. Surgical [P], left colon - CHRONIC ACTIVE COLITIS (POLYPOID FRAGMENTS) - NEGATIVE FOR GRANULOMATA, DYSPLASIA OR MALIGNANCY - SEE NOTE 2. Surgical [P], colon, transverse - MILD CHRONIC, MINIMALLY ACTIVE COLITIS - NEGATIVE FOR GRANULOMATA, DYSPLASIA OR MALIGNANCY - SEE NOTE 3. Surgical [P], right colon biopsies - COLONIC MUCOSA WITH MILD ARCHITECTURAL DISARRAY - NEGATIVE FOR SIGNIFICANT ACTIVE INFLAMMATION, DEFINITIVE CHRONIC CHANGES, GRANULOMATA, DYSPLASIA OR MALIGNANCY  Fecal calprotectin 09/17/23 - 5  Fecal calprotectin 02/14/24: 26  Fecal calprotectin 07/10/24: 98  Past Medical History:  Diagnosis Date   Asthma    Autoimmune hepatitis (HCC)    Chronic kidney disease    Fatty liver 2023   CT   Hx of pancreatitis 06/16/2018   Kidney stone    Kidney stone    Migraine    Orthostatic hypotension    Osteopenia 03/31/2012   Recurrent upper respiratory infection (URI)    Ulcerative colitis (HCC) 03/31/2012   Urticaria      Past Surgical History:  Procedure Laterality Date   BIOPSY  01/25/2022   Procedure: BIOPSY;  Surgeon: Saintclair Jasper, MD;  Location: [facility];  Service: Gastroenterology;;   CESAREAN SECTION     x3   CHOLECYSTECTOMY     COLONOSCOPY     COLONOSCOPY N/A 01/25/2022   Procedure: COLONOSCOPY;  Surgeon: Saintclair Jasper, MD;  Location: [facility];  Service: Gastroenterology;  Laterality: N/A;   LITHOTRIPSY     THYROIDECTOMY     1997   Family History  Problem Relation Age of Onset   Heart disease Mother     Dementia Mother    Thyroid  disease Mother    Osteoarthritis Mother    Asthma Mother    Heart attack Father    Heart attack Brother    Heart disease Brother    Colon cancer Maternal Aunt        dx 60s   Ovarian cancer Paternal Aunt 30   Prostate cancer Paternal Uncle        metastatic; dx 53s   Heart disease Paternal Grandmother    Crohn's disease Daughter 58   Breast cancer Cousin        paternal female cousin; dx 30s   Colon cancer Cousin 49       paternal female cousin   Stomach cancer Neg Hx    Rectal cancer Neg Hx    Esophageal cancer Neg Hx    Liver cancer  Neg Hx    Pancreatic cancer Neg Hx    Social History[1] Current Outpatient Medications  Medication Sig Dispense Refill   albuterol  (PROVENTIL ) (2.5 MG/3ML) 0.083% nebulizer solution Take 3 mLs (2.5 mg total) by nebulization every 6 (six) hours as needed for wheezing or shortness of breath. 150 mL 1   albuterol  (VENTOLIN  HFA) 108 (90 Base) MCG/ACT inhaler TAKE 2 PUFFS BY MOUTH EVERY 6 HOURS AS NEEDED FOR WHEEZE OR SHORTNESS OF BREATH 25.5 each 7   budesonide -formoterol  (SYMBICORT ) 160-4.5 MCG/ACT inhaler Inhale 1 puff into the lungs 2 (two) times daily.     cholecalciferol (VITAMIN D3) 25 MCG (1000 UNIT) tablet Take 2,000 Units by mouth daily.     Cholecalciferol 1.25 MG (50000 UT) capsule Take 1 capsule every week by oral route, for 8 weeks.     [employer] VITAMIN B12 1000 MCG tablet Take 1,000 mcg by mouth daily.     Efinaconazole  (JUBLIA ) 10 % SOLN Apply 1 Application topically daily. 5 mL 5   EPINEPHRINE  0.3 mg/0.3 mL IJ SOAJ injection INJECT 0.3 MG INTO THE MUSCLE AS NEEDED FOR ANAPHYLAXIS. 2 each 1   folic acid  (FOLVITE ) 1 MG tablet TAKE 1 TABLET BY MOUTH EVERY DAY FOR 30 DAYS 90 tablet 1   levothyroxine  (SYNTHROID ) 100 MCG tablet Take 1 tablet (100 mcg total) by mouth daily. Brand name only (Synthroid ) 30 tablet 3   lidocaine  (XYLOCAINE ) 5 % ointment APPLY TO AFFECTED AREA 1-4 TIMES DAILY AS NEEDED     Magnesium  500 MG TABS  Take 500 mg by mouth daily.     meclizine  (ANTIVERT ) 12.5 MG tablet Take 1 tablet (12.5 mg total) by mouth 3 (three) times daily as needed for dizziness (May cause drowsiness.). 30 tablet 0   metoprolol  succinate (TOPROL -XL) 50 MG 24 hr tablet Take 1 tablet (50 mg total) by mouth at bedtime. 90 tablet 2   Multiple Vitamin (MULTIVITAMIN WITH MINERALS) TABS tablet Take 1 tablet by mouth daily.     NON FORMULARY [place] Beet     Omega 3-6-9 Fatty Acids (OMEGA 3-6-9 PO) Take 1 tablet by mouth daily.     polyethylene glycol (MIRALAX ) 17 g packet Use once or twice daily 14 each 0   RINVOQ  30 MG TB24 TAKE 1 TABLET DAILY 90 tablet 1   rosuvastatin  (CRESTOR ) 20 MG tablet Take 1 tablet (20 mg total) by mouth daily. 90 tablet 2   valACYclovir  (VALTREX ) 500 MG tablet 1 tab bid x 3 days for new symptoms 30 tablet 0   vitamin C (ASCORBIC ACID) 500 MG tablet Take 500 mg by mouth daily.     No current facility-administered medications for this visit.   Allergies[2]   Review of Systems: All systems reviewed and negative except where noted in HPI.   Lab Results  Component Value Date   WBC 5.4 04/27/2024   HGB 12.7 04/27/2024   HCT 37.6 04/27/2024   MCV 89.1 04/27/2024   PLT 305.0 04/27/2024    Lab Results  Component Value Date   ALT 23 04/08/2024   AST 33 04/08/2024   ALKPHOS 57 04/08/2024   BILITOT 0.7 04/08/2024    Lab Results  Component Value Date   NA 138 07/01/2024   CL 104 07/01/2024   K 4.4 07/01/2024   CO2 27 07/01/2024   BUN 17 07/01/2024   CREATININE 0.71 07/01/2024   GFR 90.34 07/01/2024   CALCIUM  10.2 07/01/2024   ALBUMIN 4.7 04/08/2024   GLUCOSE 99 07/01/2024  Physical Exam: BP 126/74   Pulse 81   Ht 5' (1.524 m)   Wt 163 lb 2 oz (74 kg)   LMP 03/03/2013   SpO2 98%   BMI 31.86 kg/m  Constitutional: Pleasant,well-developed, female in no acute distress. Neurological: Alert and oriented to person place and time. Psychiatric: Normal mood and affect. Behavior is  normal.   ASSESSMENT: 64 y.o. female here for assessment of the following  1. Chronic ulcerative pancolitis with complication (HCC)   2. High risk medication use   3. Autoimmune hepatitis (HCC)    As above, has been on Rinvoq  over the past few years and clinically doing quite well without flares.  She tolerates it well.  Her vaccines are up-to-date, has had her flu shot this year.  We discussed her course and history thus far.  She has had pancolitis now for most 20 years.  Her fecal calprotectin has creeped up a bit although remains under 100.  I think surveillance colonoscopy is reasonable to do this year, she does have history of pseudo inflammatory polyps.  Her preference is to do it about 2 years from her last exam, which would be around August of this year.  I think that is reasonable if she is otherwise feeling well.  We will plan on surveillance labs in March with CBC, c-Met and IgG.  Again history of autoimmune hepatitis reviewed as above, she has seen hepatology, there is no plans for specific treatment for that as long as her liver enzymes and IgG remain okay.  Encouraged her to continue to avoid NSAIDs, continue MiraLAX  as needed.   PLAN: - continue Rinvoq  - labs in March CBC, CMET, IgG - colonoscopy in August - her preference is to do it then - continue Miralax  PRN - avoid NSAIDs - vaccines UTD  Marcey Naval, MD [facility]     [1]  Social History Tobacco Use   Smoking status: Never   Smokeless tobacco: Never  Vaping Use   Vaping status: Never Used  Substance Use Topics   Alcohol  use: No   Drug use: No  [2]  Allergies Allergen Reactions   Almond Oil Hives and Shortness Of Breath   Lialda  [Mesalamine ] Other (See Comments)    Vomiting and rectal bleeding   Other Anaphylaxis    Almonds, tree nuts   Aspirin Hives and Nausea Only    Stomach cramps   Hydromorphone  Hcl Other (See Comments)    Caused Enzyme levels to go up   Penicillins Hives  and Swelling    Has patient had a PCN reaction causing immediate rash, facial/tongue/throat swelling, SOB or lightheadedness with hypotension: Yes Has patient had a PCN reaction causing severe rash involving mucus membranes or skin necrosis: No Has patient had a PCN reaction that required hospitalization: No Has patient had a PCN reaction occurring within the last 10 years: No  Stomach cramps If all of the above answers are NO, then may proceed with Cephalosporin use.    Acetaminophen  Other (See Comments)    Has autoimmune hepatitis, wants to avoid APAP   Apriso  [Mesalamine  Er] Nausea And Vomiting   Corticosteroids Other (See Comments)    Had acute pancreatitis x 2 after steroid use.   Imitrex [Sumatriptan] Swelling    Injection caused swelling   Stadol [Butorphanol] Other (See Comments)    hallucinates   Topamax  [Topiramate ] Other (See Comments)    Kidney stones   Vancomycin Hives   Azathioprine Rash   Pepcid [Famotidine] Nausea And  Vomiting    Makes acid worse   Prednisone  Nausea And Vomiting    All steroids causes nausea and acute pancreatitis    "

## 2024-08-03 ENCOUNTER — Encounter: Payer: Self-pay | Admitting: Gastroenterology

## 2024-08-03 ENCOUNTER — Telehealth: Payer: Self-pay | Admitting: Podiatry

## 2024-08-03 ENCOUNTER — Ambulatory Visit: Admitting: Gastroenterology

## 2024-08-03 VITALS — BP 126/74 | HR 81 | Ht 60.0 in | Wt 163.1 lb

## 2024-08-03 DIAGNOSIS — K51019 Ulcerative (chronic) pancolitis with unspecified complications: Secondary | ICD-10-CM

## 2024-08-03 DIAGNOSIS — Z79899 Other long term (current) drug therapy: Secondary | ICD-10-CM

## 2024-08-03 DIAGNOSIS — K754 Autoimmune hepatitis: Secondary | ICD-10-CM | POA: Diagnosis not present

## 2024-08-03 NOTE — Patient Instructions (Signed)
 You will be due for a recall colonoscopy in August 2026. We will send you a reminder in the mail when it gets closer to that time.  You will be due for labs in March.  We will remind you when it is time to go.  Continue Rinvoq .  Thank you for entrusting me with your care and for choosing [facility], Dr. Elspeth Naval   _______________________________________________________  If your blood pressure at your visit was 140/90 or greater, please contact your primary care physician to follow up on this.  _______________________________________________________  If you are age 64 or older, your body mass index should be between 23-30. Your Body mass index is 31.86 kg/m. If this is out of the aforementioned range listed, please consider follow up with your Primary Care Provider.  If you are age 64 or younger, your body mass index should be between 19-25. Your Body mass index is 31.86 kg/m. If this is out of the aformentioned range listed, please consider follow up with your Primary Care Provider.   ________________________________________________________  The [facility] providers would like to encourage you to use MYCHART to communicate with providers for non-urgent requests or questions.  Due to long hold times on the telephone, sending your provider a message by [facility] may be a faster and more efficient way to get a response.  Please allow 48 business hours for a response.  Please remember that this is for non-urgent requests.  _______________________________________________________  [facility] is using a team-based approach to care.  Your team is made up of your doctor and two to three APPS. Our APPS (Nurse Practitioners and Physician Assistants) work with your physician to ensure care continuity for you. They are fully qualified to address your health concerns and develop a treatment plan. They communicate directly with your gastroenterologist to care for you. Seeing  the Advanced Practice Practitioners on your physician's team can help you by facilitating care more promptly, often allowing for earlier appointments, access to diagnostic testing, procedures, and other specialty referrals.

## 2024-08-03 NOTE — Telephone Encounter (Signed)
 Patient called to schedule an appointment for laser. I offered her the next appointment which were tomorrow which she said she was unable to make, and I notified her that the next laser appointment is early March. She says that her toe is still bothering her on the inside of the nail and she would like to know what she can do to help with this in the meantime.

## 2024-08-13 ENCOUNTER — Ambulatory Visit: Payer: Self-pay

## 2024-08-13 NOTE — Telephone Encounter (Signed)
"  Noted appt scheduled   "

## 2024-08-13 NOTE — Telephone Encounter (Signed)
 FYI Only or Action Required?: FYI only for provider: appointment scheduled on 1/27.  Patient was last seen in primary care on 07/31/2024 by Kennyth Worth HERO, MD.  Called Nurse Triage reporting Joint Swelling.  Symptoms began Thanksgiving 2025.  Interventions attempted: OTC medications: Advil , Rest, hydration, or home remedies, and Ice/heat application.  Symptoms are: gradually worsening.  Triage Disposition: See PCP Within 2 Weeks  Patient/caregiver understands and will follow disposition?: Yes   Ongoing right knee swelling since Thanksgiving 2025. Saw PCP on 1/9. Had a cortisone shot. Not helping. Still with over 10/10 pain and moderate to severe swelling. Some redness and hot to touch. No fever. No recent strain or injury. Causing low back pain and ankle pain d/t musculoskeletal imbalance.   Pt discussed with PCP possibly aspirating fluid if no improvement. Pt would like to schedule appt to do this. Scheduled soonest apt with PCP on Tuesday 1/27. Advised UC or ED for worsening symptoms.    Message from Rodney W sent at 08/13/2024  1:24 PM EST  Reason for Triage: swelling in knee (right), warm to touch and difficult to walk   Reason for Disposition  Knee pain is a chronic symptom (recurrent or ongoing AND present > 4 weeks)  Answer Assessment - Initial Assessment Questions 1. LOCATION and RADIATION: Where is the pain located?      Right knee  2. QUALITY: What does the pain feel like?  (e.g., sharp, dull, aching, burning)     Sharp  3. SEVERITY: How bad is the pain? What does it keep you from doing?   (Scale 1-10; or mild, moderate, severe)     Above 20/10   4. ONSET: When did the pain start? Does it come and go, or is it there all the time?     Thanksgiving  5. RECURRENT: Have you had this pain before? If Yes, ask: When, and what happened then?     Yes, with the left knee, that one responded to the cortisone shot   6. SETTING: Has there been any recent  work, exercise or other activity that involved that part of the body?      Denies  7. AGGRAVATING FACTORS: What makes the knee pain worse? (e.g., walking, climbing stairs, running)     Walking  8. ASSOCIATED SYMPTOMS: Is there any swelling or redness of the knee?     Yes, redness and swelling and hot to touch  9. OTHER SYMPTOMS: Do you have any other symptoms? (e.g., calf pain, chest pain, difficulty breathing, fever)     Low back pain and ankle pain d/t musculoskeletal imbalance.  Protocols used: Knee Pain-A-AH

## 2024-08-18 ENCOUNTER — Ambulatory Visit: Admitting: Family Medicine

## 2024-08-19 ENCOUNTER — Ambulatory Visit: Admitting: Family Medicine

## 2024-08-19 ENCOUNTER — Telehealth: Payer: Self-pay | Admitting: *Deleted

## 2024-08-19 VITALS — BP 130/80 | HR 88 | Temp 97.0°F | Ht 60.0 in | Wt 163.0 lb

## 2024-08-19 DIAGNOSIS — I10 Essential (primary) hypertension: Secondary | ICD-10-CM

## 2024-08-19 DIAGNOSIS — M79604 Pain in right leg: Secondary | ICD-10-CM

## 2024-08-19 DIAGNOSIS — E039 Hypothyroidism, unspecified: Secondary | ICD-10-CM | POA: Diagnosis not present

## 2024-08-19 MED ORDER — CELECOXIB 200 MG PO CAPS: 200.0000 mg | ORAL_CAPSULE | Freq: Two times a day (BID) | ORAL | 0 refills | Status: DC

## 2024-08-19 NOTE — Patient Instructions (Signed)
 It was very nice to see you today!  VISIT SUMMARY: Today, we discussed your worsening right knee pain and swelling, which began after a knee injection three weeks ago. We also addressed your concerns about the cost of your Synthroid  medication.  YOUR PLAN: RIGHT KNEE OSTEOARTHRITIS WITH ASSOCIATED SCIATICA: Your chronic osteoarthritis has worsened and is now accompanied by sciatica, causing significant pain and swelling in your right knee. -Take Celebrex  twice daily for one week to manage pain. If you experience acid reflux, use natural papaya enzymes. -Follow the provided handout on stretches, including piriformis stretch and straight leg raise. -If there is no improvement with Celebrex , we may consider prednisone . -Schedule a follow-up appointment in two weeks to assess your response to the treatment.  HYPOTHYROIDISM: You are managing your hypothyroidism with Synthroid , but there are concerns about the cost of the medication. -Check prices for Synthroid  at [facility] and [facility] pharmacy to find a more affordable option. -Consider paying out of pocket at alternative pharmacies to access lower prices.  Return if symptoms worsen or fail to improve.   Take care, Dr Kennyth  PLEASE NOTE:  If you had any lab tests, please let us  know if you have not heard back within a few days. You may see your results on mychart before we have a chance to review them but we will give you a call once they are reviewed by us .   If we ordered any referrals today, please let us  know if you have not heard from their office within the next week.   If you had any urgent prescriptions sent in today, please check with the pharmacy within an hour of our visit to make sure the prescription was transmitted appropriately.   Please try these tips to maintain a healthy lifestyle:  Eat at least 3 REAL meals and 1-2 snacks per day.  Aim for no more than 5 hours between eating.  If you eat breakfast, please do so within one  hour of getting up.   Each meal should contain half fruits/vegetables, one quarter protein, and one quarter carbs (no bigger than a computer mouse)  Cut down on sweet beverages. This includes juice, soda, and sweet tea.   Drink at least 1 glass of water with each meal and aim for at least 8 glasses per day  Exercise at least 150 minutes every week.

## 2024-08-19 NOTE — Assessment & Plan Note (Signed)
Initially elevated but at goal on recheck.  She will monitor at home and let us know if persistently elevated.

## 2024-08-19 NOTE — Progress Notes (Signed)
 "  Danielle Bond is a 64 y.o. female who presents today for an office visit.  Assessment/Plan:  New/Acute Problems: Right Leg Pain  No red flags.  Pain seems to be likely multifactorial.  She does have known right knee osteoarthritis and received a steroid injection a few weeks ago.  The pain and swelling in her right knee did improve though has seemed to worsen over the last few weeks.  Over the last few weeks she has also developed sciatica like symptoms including pain radiating from her lower back and glutes into lower hamstrings.  We discussed treatment options.  She would like to avoid steroids if possible.  She has previously done okay with short courses of NSAIDs and did okay with Celebrex  about 10 to 20 years ago.  She did have some improvement with Aleve  and Advil  at home though was afraid to take this for an extended course.  We will start Celebrex  200 mg twice daily for the next 1 to 2 weeks.  We discussed home exercise program and handout was given as well.  Did discuss referral to PT however deferred for today.  She will let us  know if not proving in the next week or so and would consider course of prednisone  versus referral to PT orthopedics at that time.  Chronic Problems Addressed Today: Hypothyroidism s/p total thyroidectomy 1997 She is on brand-name Synthroid  100 mcg daily.  She did note recent increased cost of Synthroid .  Recommended patient check around other local pharmacies to see if she can find a more affordable as she is currently paying out-of-pocket.  Primary hypertension Initially elevated but at goal on recheck. She will monitor at home and let us  know if persistently elevated.     Subjective:  HPI:  See assessment / plan for status of chronic conditions.   Discussed the use of AI scribe software for clinical note transcription with the patient, who gave verbal consent to proceed.  History of Present Illness Danielle Bond is a 64 year old female with  arthritis who presents with worsening right knee pain and swelling.  She has been experiencing persistent and worsening pain primarily on the right side, which began after a knee injection approximately three weeks ago. Initially, the injection provided relief for two to three days, but the pain returned to baseline levels. The pain is described as excruciating and affects her ability to sleep.  The pain radiates from the knee to the hip and gluteal region, and occasionally affects the ankle, particularly when she steps in the morning. The pain is primarily on the right side and does not extend to her toes. It also affects her sacroiliac joint, which was previously doing well.  She experiences swelling in the knee, which has decreased slightly since the last visit. She describes the sensation in her glute and hamstring as 'fatigue'.  She has tried Advil , which provided relief for about an hour, but she is cautious about its effects on her stomach. She also tried Aleve  a couple of weeks ago without benefit. She is not currently taking any muscle relaxants and does not report muscle spasms.  Her past medical history includes arthritis, and she has previously used Celebrex  for lower back pain about twenty years ago. She is allergic to Pepcid and uses natural papaya enzyme for heartburn relief.  She is concerned about the cost of her Synthroid  medication, which she pays for out of pocket, and has noted a significant increase in price recently.  Objective:  Physical Exam: BP 130/80   Pulse 88   Temp (!) 97 F (36.1 C) (Temporal)   Ht 5' (1.524 m)   Wt 163 lb (73.9 kg)   LMP 03/03/2013   SpO2 97%   BMI 31.83 kg/m   Gen: No acute distress, resting comfortably CV: Regular rate and rhythm with no murmurs appreciated Pulm: Normal work of breathing, clear to auscultation bilaterally with no crackles, wheezes, or rhonchi MUSCULOSKELETAL: - Back: No deformities.  Tender to palpation along  lower lumbar paraspinal muscle group. - Leg: No deformities.  Slight tenderness and effusion noted along right knee medial joint line.  Neurovascular intact distally.  Straight leg raise positive. Neuro: Grossly normal, moves all extremities Psych: Normal affect and thought content      Ashanti Ratti M. Kennyth, MD 08/19/2024 10:36 AM  "

## 2024-08-19 NOTE — Assessment & Plan Note (Signed)
 She is on brand-name Synthroid  100 mcg daily.  She did note recent increased cost of Synthroid .  Recommended patient check around other local pharmacies to see if she can find a more affordable as she is currently paying out-of-pocket.

## 2024-08-19 NOTE — Telephone Encounter (Signed)
 Copied from CRM [id number]. Topic: Clinical - Prescription Issue >> Aug 19, 2024 11:58 AM Burnard DEL wrote: Reason for CRM: Patient called in stating that she was prescribed celecoxib  (CELEBREX ) 200 MG capsule which she was was notified by pharmacy that has aspirin in it,and she is highly allergic to aspirin. Patient would  like to know if she could have the  prednisone  prescribed instead that was discussed with provider?   Please advise  Tiras Bianchini,RMA

## 2024-08-20 ENCOUNTER — Other Ambulatory Visit: Payer: Self-pay | Admitting: *Deleted

## 2024-08-20 MED ORDER — PREDNISONE 20 MG PO TABS: 20.0000 mg | ORAL_TABLET | Freq: Every day | ORAL | 0 refills | Status: AC

## 2024-08-20 NOTE — Telephone Encounter (Signed)
 Rx prednisone  20 mg daily x 7 days send to pharmacy  Patient notified

## 2024-08-20 NOTE — Telephone Encounter (Signed)
 Noted. We discussed at her visit that she did Celebrex  years ago and didn't have an issue but it is ok to send in prednisone  20 mg daily x 7 days if she prefer this instead.

## 2024-08-25 ENCOUNTER — Ambulatory Visit: Payer: Self-pay

## 2024-08-25 NOTE — Telephone Encounter (Signed)
 Noted.

## 2024-08-28 ENCOUNTER — Ambulatory Visit: Admitting: Family Medicine

## 2024-08-28 ENCOUNTER — Encounter: Payer: Self-pay | Admitting: Family Medicine

## 2024-08-28 ENCOUNTER — Other Ambulatory Visit: Payer: Self-pay | Admitting: Dermatology

## 2024-08-28 VITALS — BP 120/70 | HR 86 | Temp 97.0°F | Ht 60.0 in | Wt 164.0 lb

## 2024-08-28 DIAGNOSIS — M25561 Pain in right knee: Secondary | ICD-10-CM

## 2024-08-28 MED ORDER — KETOROLAC TROMETHAMINE 60 MG/2ML IM SOLN
60.0000 mg | Freq: Once | INTRAMUSCULAR | Status: AC
  Administered 2024-08-28: 60 mg via INTRAMUSCULAR

## 2024-08-28 NOTE — Progress Notes (Signed)
 "  Danielle Bond is a 64 y.o. female who presents today for an office visit.  Assessment/Plan:  Right Knee Pain Patient with persistent right knee pain for the last several weeks.  We did perform an intra-articular injection here several weeks ago and recently gave her a steroid burst though still has persistent pain and swelling to the area.  She will be seeing her orthopedic next week, but is interested in trying other options to help with management and pain until then.  She would like to avoid any further rounds of systemic steroids at this point.  We discussed other treatment options.  Will give 60 mg IM Toradol  today.  She does have a aspirin allergy listed in her chart though has tolerated other NSAIDs such as Aleve  and Advil  well recently and she did receive a Toradol  injection a few years ago for a liver biopsy and tolerated this well also.  X-ray from a couple of months ago did show some mild degenerative changes however it is possible she may have some underlying soft tissue pathology including potential meniscal tear as well especially in light of her recent mechanical symptoms.  Will defer further management to orthopedics who she will be seeing next week.       Subjective:  HPI:  See assessment / plan for status of chronic conditions.    Discussed the use of AI scribe software for clinical note transcription with the patient, who gave verbal consent to proceed.  History of Present Illness Danielle Bond is a 64 year old female who presents with persistent knee pain and swelling.  She experiences severe pain primarily in her knee, describing it as feeling like 'somebody's taking a drill' to it. The pain disrupts her sleep, keeping her awake until 5 AM. While the pain is mostly localized in the knee, there is some improvement in pain radiating from the gluteal region.  Significant swelling in the knee is present, described as looking 'like a football' upon waking. She manages  the swelling with ice and heat in ten-minute intervals, but it persists. The knee is warm to the touch and red. Previous treatments include prednisone , which was ineffective, and Advil , which provides temporary relief for about an hour. She has taken two Advil  tablets recently due to increased pain.   Extending her leg while sitting provides some relief, but standing causes the knee to feel unstable, as if it might 'collapse' or 'buckle.' This instability has been more pronounced over the last few days. No recent trauma is reported, but an MRI from October showed mild degenerative changes in the posterior horn of the medial meniscus of her left knee.  Her past medical history includes asthma and a severe allergic reaction to aspirin, causing facial swelling and difficulty breathing, leading to an ER visit. . She has been using a Theragun and performing stretches, which have helped with gluteal pain but not the knee pain. She is scheduled to see an orthopedic specialist soon due to a recent cancellation.         Objective:  Physical Exam: BP 120/70   Pulse 86   Temp (!) 97 F (36.1 C) (Temporal)   Ht 5' (1.524 m)   Wt 164 lb (74.4 kg)   LMP 03/03/2013   SpO2 98%   BMI 32.03 kg/m   Gen: No acute distress, resting comfortably Neuro: Grossly normal, moves all extremities Psych: Normal affect and thought content      Herbert Marken M. Kennyth, MD 08/28/2024  11:40 AM  "

## 2024-08-28 NOTE — Patient Instructions (Signed)
 It was very nice to see you today!  VISIT SUMMARY: Today, we addressed your persistent knee pain and swelling, as well as your right-sided sciatica-like symptoms. You received a Toradol  injection for immediate pain relief and discussed further steps for managing your conditions.  YOUR PLAN: RIGHT KNEE OSTEOARTHRITIS WITH EFFUSION AND PAIN: You have chronic osteoarthritis in your right knee with fluid buildup and pain. An X-ray showed fluid and a possible bone spur. -You received a Toradol  injection for immediate pain relief. -Continue with your scheduled orthopedic consultation for further evaluation and possible aspiration of fluid and steroid injection. -Keep icing your knee for 10 minutes on and off to reduce swelling.  RIGHT-SIDED SCIATICA-LIKE SYMPTOMS: You have residual discomfort in your gluteal region, primarily feeling pain in your knee. -Continue stretching exercises and using the Theragun to manage your symptoms.  Return if symptoms worsen or fail to improve.   Take care, Dr Kennyth  PLEASE NOTE:  If you had any lab tests, please let us  know if you have not heard back within a few days. You may see your results on mychart before we have a chance to review them but we will give you a call once they are reviewed by us .   If we ordered any referrals today, please let us  know if you have not heard from their office within the next week.   If you had any urgent prescriptions sent in today, please check with the pharmacy within an hour of our visit to make sure the prescription was transmitted appropriately.   Please try these tips to maintain a healthy lifestyle:  Eat at least 3 REAL meals and 1-2 snacks per day.  Aim for no more than 5 hours between eating.  If you eat breakfast, please do so within one hour of getting up.   Each meal should contain half fruits/vegetables, one quarter protein, and one quarter carbs (no bigger than a computer mouse)  Cut down on sweet beverages.  This includes juice, soda, and sweet tea.   Drink at least 1 glass of water with each meal and aim for at least 8 glasses per day  Exercise at least 150 minutes every week.

## 2024-09-01 ENCOUNTER — Encounter: Admitting: Family

## 2024-09-24 ENCOUNTER — Encounter: Admitting: Family Medicine
